# Patient Record
Sex: Female | Born: 1935 | Race: White | Hispanic: No
Health system: Southern US, Community
[De-identification: ages and names within clinical notes are randomized; demographics above are authoritative.]

## PROBLEM LIST (undated history)

## (undated) DIAGNOSIS — K922 Gastrointestinal hemorrhage, unspecified: Secondary | ICD-10-CM

## (undated) DIAGNOSIS — Z9889 Other specified postprocedural states: Secondary | ICD-10-CM

## (undated) DIAGNOSIS — I480 Paroxysmal atrial fibrillation: Secondary | ICD-10-CM

## (undated) DIAGNOSIS — F4489 Other dissociative and conversion disorders: Secondary | ICD-10-CM

## (undated) DIAGNOSIS — F419 Anxiety disorder, unspecified: Secondary | ICD-10-CM

## (undated) DIAGNOSIS — I5032 Chronic diastolic (congestive) heart failure: Secondary | ICD-10-CM

## (undated) DIAGNOSIS — I219 Acute myocardial infarction, unspecified: Secondary | ICD-10-CM

## (undated) DIAGNOSIS — H3530 Unspecified macular degeneration: Secondary | ICD-10-CM

## (undated) DIAGNOSIS — R112 Nausea with vomiting, unspecified: Secondary | ICD-10-CM

## (undated) DIAGNOSIS — I8390 Asymptomatic varicose veins of unspecified lower extremity: Secondary | ICD-10-CM

## (undated) DIAGNOSIS — I119 Hypertensive heart disease without heart failure: Secondary | ICD-10-CM

## (undated) DIAGNOSIS — J449 Chronic obstructive pulmonary disease, unspecified: Secondary | ICD-10-CM

## (undated) DIAGNOSIS — R7989 Other specified abnormal findings of blood chemistry: Secondary | ICD-10-CM

## (undated) DIAGNOSIS — G629 Polyneuropathy, unspecified: Secondary | ICD-10-CM

## (undated) DIAGNOSIS — E119 Type 2 diabetes mellitus without complications: Secondary | ICD-10-CM

## (undated) DIAGNOSIS — Z8489 Family history of other specified conditions: Secondary | ICD-10-CM

## (undated) DIAGNOSIS — N184 Chronic kidney disease, stage 4 (severe): Secondary | ICD-10-CM

## (undated) DIAGNOSIS — R778 Other specified abnormalities of plasma proteins: Secondary | ICD-10-CM

## (undated) HISTORY — DX: Gastrointestinal hemorrhage, unspecified: K92.2

## (undated) HISTORY — DX: Asymptomatic varicose veins of unspecified lower extremity: I83.90

## (undated) HISTORY — PX: VEIN SURGERY: SHX48

## (undated) HISTORY — PX: ABDOMINAL HYSTERECTOMY: SHX81

## (undated) HISTORY — DX: Paroxysmal atrial fibrillation: I48.0

## (undated) HISTORY — PX: FRACTURE SURGERY: SHX138

---

## 1997-09-02 ENCOUNTER — Other Ambulatory Visit: Admission: RE | Admit: 1997-09-02 | Discharge: 1997-09-02 | Payer: Self-pay | Admitting: Family Medicine

## 1997-09-22 ENCOUNTER — Other Ambulatory Visit: Admission: RE | Admit: 1997-09-22 | Discharge: 1997-09-22 | Payer: Self-pay | Admitting: *Deleted

## 1998-01-26 ENCOUNTER — Other Ambulatory Visit: Admission: RE | Admit: 1998-01-26 | Discharge: 1998-01-26 | Payer: Self-pay | Admitting: *Deleted

## 1998-08-17 ENCOUNTER — Encounter: Payer: Self-pay | Admitting: Emergency Medicine

## 1998-08-17 ENCOUNTER — Emergency Department (HOSPITAL_COMMUNITY): Admission: EM | Admit: 1998-08-17 | Discharge: 1998-08-17 | Payer: Self-pay | Admitting: Emergency Medicine

## 1998-09-25 ENCOUNTER — Other Ambulatory Visit: Admission: RE | Admit: 1998-09-25 | Discharge: 1998-09-25 | Payer: Self-pay | Admitting: *Deleted

## 1998-10-09 ENCOUNTER — Encounter: Admission: RE | Admit: 1998-10-09 | Discharge: 1999-01-07 | Payer: Self-pay | Admitting: Family Medicine

## 2000-11-28 ENCOUNTER — Encounter: Payer: Self-pay | Admitting: Internal Medicine

## 2000-11-28 ENCOUNTER — Ambulatory Visit (HOSPITAL_COMMUNITY): Admission: RE | Admit: 2000-11-28 | Discharge: 2000-11-28 | Payer: Self-pay | Admitting: Internal Medicine

## 2001-08-13 ENCOUNTER — Encounter (HOSPITAL_BASED_OUTPATIENT_CLINIC_OR_DEPARTMENT_OTHER): Payer: Self-pay | Admitting: Internal Medicine

## 2001-08-13 ENCOUNTER — Encounter: Admission: RE | Admit: 2001-08-13 | Discharge: 2001-08-28 | Payer: Self-pay | Admitting: Internal Medicine

## 2001-09-16 ENCOUNTER — Encounter (HOSPITAL_BASED_OUTPATIENT_CLINIC_OR_DEPARTMENT_OTHER): Admission: RE | Admit: 2001-09-16 | Discharge: 2001-10-09 | Payer: Self-pay | Admitting: Internal Medicine

## 2002-05-27 HISTORY — PX: HIP FRACTURE SURGERY: SHX118

## 2002-12-08 ENCOUNTER — Encounter: Payer: Self-pay | Admitting: Emergency Medicine

## 2002-12-08 ENCOUNTER — Inpatient Hospital Stay (HOSPITAL_COMMUNITY): Admission: AD | Admit: 2002-12-08 | Discharge: 2002-12-13 | Payer: Self-pay | Admitting: Emergency Medicine

## 2002-12-13 ENCOUNTER — Inpatient Hospital Stay (HOSPITAL_COMMUNITY): Admission: RE | Admit: 2002-12-13 | Discharge: 2002-12-21 | Payer: Self-pay | Admitting: Orthopaedic Surgery

## 2002-12-13 ENCOUNTER — Encounter: Payer: Self-pay | Admitting: Physical Medicine & Rehabilitation

## 2005-01-13 ENCOUNTER — Emergency Department (HOSPITAL_COMMUNITY): Admission: EM | Admit: 2005-01-13 | Discharge: 2005-01-13 | Payer: Self-pay | Admitting: Family Medicine

## 2005-02-26 ENCOUNTER — Emergency Department (HOSPITAL_COMMUNITY): Admission: EM | Admit: 2005-02-26 | Discharge: 2005-02-26 | Payer: Self-pay | Admitting: Family Medicine

## 2005-09-17 ENCOUNTER — Ambulatory Visit (HOSPITAL_COMMUNITY): Admission: RE | Admit: 2005-09-17 | Discharge: 2005-09-17 | Payer: Self-pay | Admitting: Ophthalmology

## 2005-10-30 ENCOUNTER — Emergency Department (HOSPITAL_COMMUNITY): Admission: EM | Admit: 2005-10-30 | Discharge: 2005-10-30 | Payer: Self-pay | Admitting: Family Medicine

## 2006-03-24 ENCOUNTER — Ambulatory Visit (HOSPITAL_COMMUNITY): Admission: RE | Admit: 2006-03-24 | Discharge: 2006-03-24 | Payer: Self-pay | Admitting: Ophthalmology

## 2009-04-12 ENCOUNTER — Observation Stay (HOSPITAL_COMMUNITY): Admission: EM | Admit: 2009-04-12 | Discharge: 2009-04-13 | Payer: Self-pay | Admitting: Emergency Medicine

## 2009-04-13 ENCOUNTER — Encounter (INDEPENDENT_AMBULATORY_CARE_PROVIDER_SITE_OTHER): Payer: Self-pay | Admitting: Emergency Medicine

## 2009-04-13 ENCOUNTER — Encounter (INDEPENDENT_AMBULATORY_CARE_PROVIDER_SITE_OTHER): Payer: Self-pay | Admitting: Internal Medicine

## 2009-04-13 ENCOUNTER — Ambulatory Visit: Payer: Self-pay | Admitting: Vascular Surgery

## 2009-04-23 ENCOUNTER — Emergency Department (HOSPITAL_COMMUNITY): Admission: EM | Admit: 2009-04-23 | Discharge: 2009-04-23 | Payer: Self-pay | Admitting: Emergency Medicine

## 2010-08-29 LAB — DIFFERENTIAL
Basophils Absolute: 0 10*3/uL (ref 0.0–0.1)
Basophils Absolute: 0.1 10*3/uL (ref 0.0–0.1)
Basophils Relative: 0 % (ref 0–1)
Basophils Relative: 1 % (ref 0–1)
Eosinophils Absolute: 0.2 K/uL (ref 0.0–0.7)
Eosinophils Relative: 3 % (ref 0–5)
Lymphocytes Relative: 17 % (ref 12–46)
Lymphs Abs: 1.2 10*3/uL (ref 0.7–4.0)
Lymphs Abs: 1.4 10*3/uL (ref 0.7–4.0)
Monocytes Absolute: 0.5 10*3/uL (ref 0.1–1.0)
Monocytes Absolute: 0.5 10*3/uL (ref 0.1–1.0)
Monocytes Relative: 5 % (ref 3–12)
Monocytes Relative: 6 % (ref 3–12)
Neutro Abs: 5.5 10*3/uL (ref 1.7–7.7)
Neutro Abs: 7.6 10*3/uL (ref 1.7–7.7)
Neutrophils Relative %: 73 % (ref 43–77)
Neutrophils Relative %: 79 % — ABNORMAL HIGH (ref 43–77)

## 2010-08-29 LAB — COMPREHENSIVE METABOLIC PANEL
ALT: 14 U/L (ref 0–35)
Albumin: 3.6 g/dL (ref 3.5–5.2)
Albumin: 3.7 g/dL (ref 3.5–5.2)
BUN: 22 mg/dL (ref 6–23)
BUN: 34 mg/dL — ABNORMAL HIGH (ref 6–23)
CO2: 26 mEq/L (ref 19–32)
Calcium: 9 mg/dL (ref 8.4–10.5)
Calcium: 9 mg/dL (ref 8.4–10.5)
Chloride: 103 mEq/L (ref 96–112)
Creatinine, Ser: 1.01 mg/dL (ref 0.4–1.2)
Creatinine, Ser: 1.17 mg/dL (ref 0.4–1.2)
GFR calc Af Amer: 55 mL/min — ABNORMAL LOW (ref >60)
GFR calc non Af Amer: 45 mL/min — ABNORMAL LOW (ref >60)
Glucose, Bld: 235 mg/dL — ABNORMAL HIGH (ref 70–99)
Total Bilirubin: 0.4 mg/dL (ref 0.3–1.2)

## 2010-08-29 LAB — URINALYSIS, ROUTINE W REFLEX MICROSCOPIC
Bilirubin Urine: NEGATIVE
Bilirubin Urine: NEGATIVE
Glucose, UA: NEGATIVE mg/dL
Hgb urine dipstick: NEGATIVE
Hgb urine dipstick: NEGATIVE
Ketones, ur: NEGATIVE mg/dL
Ketones, ur: NEGATIVE mg/dL
Nitrite: NEGATIVE
Protein, ur: NEGATIVE mg/dL
Protein, ur: NEGATIVE mg/dL
Specific Gravity, Urine: 1.009 (ref 1.005–1.030)
Specific Gravity, Urine: 1.014 (ref 1.005–1.030)
Urobilinogen, UA: 0.2 mg/dL (ref 0.0–1.0)
Urobilinogen, UA: 0.2 mg/dL (ref 0.0–1.0)
pH: 6 (ref 5.0–8.0)
pH: 6.5 (ref 5.0–8.0)

## 2010-08-29 LAB — CBC
HCT: 39.8 % (ref 36.0–46.0)
Hemoglobin: 13.4 g/dL (ref 12.0–15.0)
Hemoglobin: 13.6 g/dL (ref 12.0–15.0)
MCHC: 34.2 g/dL (ref 30.0–36.0)
MCV: 86.4 fL (ref 78.0–100.0)
Platelets: 225 10*3/uL (ref 150–400)
RBC: 4.49 MIL/uL (ref 3.87–5.11)
RBC: 4.61 MIL/uL (ref 3.87–5.11)
RDW: 14.4 % (ref 11.5–15.5)
RDW: 14.4 % (ref 11.5–15.5)
WBC: 7.5 10*3/uL (ref 4.0–10.5)
WBC: 9.7 10*3/uL (ref 4.0–10.5)

## 2010-08-29 LAB — POCT CARDIAC MARKERS
CKMB, poc: 1.4 ng/mL (ref 1.0–8.0)
Myoglobin, poc: 126 ng/mL (ref 12–200)
Troponin i, poc: <0.05 ng/mL (ref 0.00–0.09)

## 2010-08-29 LAB — COMPREHENSIVE METABOLIC PANEL WITH GFR
ALT: 18 U/L (ref 0–35)
AST: 19 U/L (ref 0–37)
Alkaline Phosphatase: 104 U/L (ref 39–117)
CO2: 28 meq/L (ref 19–32)
Glucose, Bld: 180 mg/dL — ABNORMAL HIGH (ref 70–99)
Potassium: 3.6 meq/L (ref 3.5–5.1)
Sodium: 140 meq/L (ref 135–145)
Total Protein: 6.5 g/dL (ref 6.0–8.3)

## 2010-08-29 LAB — LIPID PANEL
Cholesterol: 144 mg/dL (ref 0–200)
Triglycerides: 303 mg/dL — ABNORMAL HIGH (ref <150)

## 2010-08-29 LAB — GLUCOSE, CAPILLARY
Glucose-Capillary: 116 mg/dL — ABNORMAL HIGH (ref 70–99)
Glucose-Capillary: 130 mg/dL — ABNORMAL HIGH (ref 70–99)
Glucose-Capillary: 180 mg/dL — ABNORMAL HIGH (ref 70–99)

## 2010-08-29 LAB — APTT
aPTT: 26 seconds (ref 24–37)
aPTT: 30 seconds (ref 24–37)

## 2010-08-29 LAB — PROTIME-INR
INR: 0.97 (ref 0.00–1.49)
INR: 0.99 (ref 0.00–1.49)
Prothrombin Time: 12.8 seconds (ref 11.6–15.2)
Prothrombin Time: 13 seconds (ref 11.6–15.2)

## 2010-08-29 LAB — CK TOTAL AND CKMB (NOT AT ARMC)
CK, MB: 4.8 ng/mL — ABNORMAL HIGH (ref 0.3–4.0)
Total CK: 43 U/L (ref 7–177)

## 2010-08-29 LAB — BASIC METABOLIC PANEL
Chloride: 105 mEq/L (ref 96–112)
Creatinine, Ser: 1.11 mg/dL (ref 0.4–1.2)
GFR calc Af Amer: 58 mL/min — ABNORMAL LOW (ref >60)
Sodium: 139 mEq/L (ref 135–145)

## 2010-08-29 LAB — HEMOGLOBIN A1C: Mean Plasma Glucose: 183 mg/dL

## 2010-08-29 LAB — TROPONIN I: Troponin I: 0.02 ng/mL (ref 0.00–0.06)

## 2010-08-29 LAB — URINE MICROSCOPIC-ADD ON

## 2010-10-12 NOTE — Op Note (Signed)
Alexis [name], Alexis [name]         ACCOUNT NO.:  [id number]   MEDICAL RECORD NO.:  [id number]          PATIENT TYPE:  AMB   LOCATION:  SDS                          FACILITY:  [facility]   PHYSICIAN:  Alford Highland. Rankin, M.D.   DATE OF BIRTH:  1932/03/31   DATE OF PROCEDURE:  09/17/2005  DATE OF DISCHARGE:                                 OPERATIVE REPORT   PREOPERATIVE DIAGNOSIS:  Macular hole, stage 3, left eye.   POSTOPERATIVE DIAGNOSES:  1.  Macular hole, stage 3, left eye.  2.  Retinal hole, left eye, status post peeling o the internal limiting      membrane.   OPERATION PERFORMED:  1.  Posterior vitrectomy with membrane peel, internal limiting membrane,      using ICG guidance, 25 gauge system.  2.  Localized photocoagulation of retinal hole, supratemporal to the macular      region.  3.  Injection of vitreous substitute SF6, 30%.   SURGEON:  Alford Highland. Rankin, M.D.   ANESTHESIA:  General endotracheal anesthesia as per the patient's request.   INDICATIONS FOR THE PROCEDURE:  The patient is a 77 year old woman who has  profound vision loss on the basis of a stage 3 macular hole in the left eye.  The patient understands this is the only chance for visual acuity  improvement.  She understands the limitations of the procedure including the  underlying hole, but also the risks of anesthesia including the rare  occurrence of death; and, complications including, but not limited to  __________ , scarring, need for further surgery, no change in vision, loss  of vision, and progression of disease despite intervention.  Appropriate  signed consent was obtained.  The patient was brought to the operating room.   DESCRIPTION OF THE OPERATION:  In the operating room appropriate monitors  were placed followed by mild sedation.  General endotracheal anesthesia was  administered without difficulty.  The left periocular region was sterilely  prepped and draped in the usual ophthalmic fashion.  At this  time a 25 gauge  trocar system was placed in the infratemporal quadrant and the infusion was  placed and turned on.  A superior 25 gauge trocar was applied.   Core vitrectomy was then begun.  The microscope with bion attachment was  used.  Physician induced posterior vitreous detachment was created nasally  to the optic nerve and this was elevated anterior to the equator, and  trimmed for 360 degrees.  The anterior hyaloid was transected and removed.  At this time fluid-air exchange was completed.  With a small residual amount  of fluid over the posterior pole a dilute 1-5 of ICG was then placed over  the posterior pole and immediately removed with extrusion needle.  An air-  fluid exchange was completed.  Under fluid the internal limiting membrane  was clearly seen and was removed in a continuous sheet.  The edges to the  macular hole were released.   There was a small tractional tear just outside the superotemporal arcade.  No subretinal fluid developed.  Nonetheless, this area was treated with  localized photocoagulation for retinopexy  purposes.  At this time a fluid-  air exchange was completed, __________  exchange completed.  The trocar was  removed.  Intraocular pressure was maintained.  The infusion was remove.   Subconjunctival Decadron was applied.  The patient was given Cipro IV.  The  patient had a sterile patch and Fox shield applied.  The patient was taken  to the recovery room in good and stable condition.      Alford Highland Rankin, M.D.  Electronically Signed     GAR/MEDQ  D:  09/17/2005  T:  09/17/2005  Job:  [id number]

## 2010-10-12 NOTE — Op Note (Signed)
NAMEBLONDIE, RIGGSBEE         ACCOUNT NO.:  [id number]   MEDICAL RECORD NO.:  [id number]          PATIENT TYPE:  AMB   LOCATION:  SDS                          FACILITY:  [facility]   PHYSICIAN:  Alford Highland. Rankin, M.D.   DATE OF BIRTH:  [birth year]   DATE OF PROCEDURE:  03/24/2006  DATE OF DISCHARGE:  03/24/2006                                 OPERATIVE REPORT   PREOPERATIVE DIAGNOSIS:  Macular hole - right eye.   POSTOPERATIVE DIAGNOSIS:  Macular hole - right eye.   PROCEDURE:  1. Posterior vitrectomy membrane peel, right eye -25 gauge, peeling of the      internal limiting membrane.  2. Injection of vitreous subsheath - 15% right eye.   SURGEON:  Alford Highland. Rankin, M.D.   ANESTHESIA:  Local __________  control.   INDICATIONS FOR PROCEDURE:  The patient is a 77 year old woman who has  profound vision loss in her second eye, also on the basis of stage-3 macular  hole.  The patient will have an attempt to remove the internal limiting  membrane so as to allow for macular hole mobilization and closure.  The  patient understands the risk of the procedure and anesthesia, including but  not limited to hemorrhage, infection, scarring, need for another surgery, no  change of vision, loss of vision, and progressive disease despite  intervention.  Appropriate signed consent was obtained.   The patient taken to the operative room.  In the operating room, appropriate  monitors were applied.  After __________ 0.75% Marcaine, in 5 mL,  retrobulbar with additional 5 mL __________ modified Darel Hong.  The right  periocular region was sterilely prepped and draped in the ophthalmic  fashion, lid speculum was applied.  A 25-gauge trocar was applied in the  inferotemporal quadrant.  Superior trocar was applied.  Cor vitrectomy was  then begun.  Physician induced posterior vitreus test was created.  Vitreous  was scored and was then turned 360 degrees.  At this time, fluid-air  exchange was completed.   Dilute ICG indocyanine green was placed over the  posterior pole.  This is immediately aspirated and an air-fluid exchange  completed.  Under fluid, the internal limiting membrane was adequately  identified and it was removed in a continuous fashion with all the edges  from the edge of the macular hole were removed, mobilized.  An area  approximately 1 to 1.5 disk diameter around the edge of the macular hole was  removed.  No complications occurred.  At this time, a fluid air exchange  completed.  At this time, an SF-6 15% exchange completed.  Superior trocar  was removed.  The infusion removed.  There was a small conjunctival rent  superiorly and this closed with 7-0 Vicryl.  Subconjunctival injection of  Decadron applied.  A sterile patch and Fox shield applied.  The patient  tolerated the procedure without complications.     Alford Highland Rankin, M.D.  Electronically Signed    GAR/MEDQ  D:  03/24/2006  T:  03/25/2006  Job:  [id number]

## 2010-10-12 NOTE — Discharge Summary (Signed)
[name], [name]                     ACCOUNT NO.:  [id number]   MEDICAL RECORD NO.:  [id number]                   PATIENT TYPE:  IPS   LOCATION:  4143                                 FACILITY:  [facility]   PHYSICIAN:  Alexis [name], M.D.             DATE OF BIRTH:  March 28, 1932   DATE OF ADMISSION:  12/13/2002  DATE OF DISCHARGE:  12/21/2002                                 DISCHARGE SUMMARY   DISCHARGE DIAGNOSES:  1. Open reduction and internal fixation right hip fracture.  2. Postoperative anemia.  3. Diabetes mellitus.  4. Chronic renal insufficiency, improved.   HISTORY OF PRESENT ILLNESS:  [name] is a 77 year old female  with a history of diabetes mellitus, chronic renal insufficiency admitted on  December 08, 2002, with fall and right hip pain secondary to right  subtrochanteric fracture.  She was evaluated by Dr. Ophelia [name] and underwent  right hip ORIF the same day.  Postoperatively, she is touchdown  weightbearing and on Coumadin for DVT prophylaxis.  She did develop  postoperative anemia and was transfused with ___ units of packed red blood  cells with H&H at 8.1.  Physical therapy initiated and the patient is +2  total assist, 50% for transfers, has ambulated 20 feet with standard walker,  requiring cues for weightbearing restrictions.   ALLERGIES:  1. PENICILLIN.  2. NOVOCAINE.   PAST MEDICAL HISTORY:  1. Diabetes mellitus type 2.  2. Peripheral edema.  3. Gout.   SOCIAL HISTORY:  The patient lives with her husband in a one level home with  one step at entry.  She was independent prior to admission.  She did not use  any alcohol or tobacco.  Preferably requesting some assistance post  discharge.   HOSPITAL COURSE:  [name] was admitted to rehab on December 13, 2002, for inpatient therapies to consist of PT and OT daily.  Past  admission, she was maintained on Coumadin for DVT prophylaxis and is to  continue until August 08, 2002,  with [facility] to follow and adjust.  The patient was transfused with two additional units of packed red blood  cells for continued postoperative anemia.  Follow-up CBC on December 14, 2002,  shows hemoglobin 11.7, hematocrit 32.9, white count 6.9, platelets 276.  Check of lytes at admission showed renal insufficiency improved with sodium  130, potassium 3.8, chloride 99, CO2 29, BUN 26, creatinine 1.1, glucose  122.  A UA/UCS was done past admission and showed multispecies.  The patient  has been asymptomatic for GU symptoms.  The patient did have some complaints  of right lower extremity pain, especially in right popliteal fossa and  bilateral lower extremity Duplex were done showing a Baker cyst in right  popliteal fossa 1.06 cm, possibly rupturing.  The patient's lower extremity  incision has improved.  Her right hip incision is healing well without any  signs or symptoms of infection, no drainage,  no erythema noted. Staples were  discontinued and the area was steri-stripped on postoperative day one  without difficulty.  The patient has made good progress during her stay in  rehab.  She has progressed to being modified independent for bed mobility,  modified independent for transfers, modified independent for ambulating 50  to 100 feet with standard walker.  She requires minimal assist for  navigation of one step.  The patient is modified independent to occasional  supervision for ADL needs.  She will continue to receive follow-up home  health PT past discharge with home health R.N. to draw pro times on December 20, 2002.  The patient is discharged to home.   DISCHARGE MEDICATIONS:  1. Coumadin 5 mg per day.  2. _______ _000 mg q.h.s.  3. Ferrous sulfate 325 mg a day.  4. OxyContin CR 10 mg q.12h.  5. Hydrochlorothiazide 25 mg q. Tuesday and Thursday.  6. ________ 1000 mg b.i.d.  7. Senokot S one q.h.s. p.r.n.  8. Robaxin 500 mg q.i.d. p.r.n. spasms.  9. Oxycodone 5 mg q.4-6h.  p.r.n. pain.   ACTIVITY:  Continue using walker.  Touchdown weightbearing to right lower  extremity.   DISCHARGE DIET:  Diabetic diet.   DISCHARGE INSTRUCTIONS:  No alcohol, no smoking, no driving.  Avoid aspirin  and aspirin-containing products while on Coumadin.  Continue monitoring  blood sugars with CBG checks.  [facility] to provide PT and R.N.   FOLLOW UP:  The patient is to follow up with Dr. Ophelia [name] in the next couple of  weeks, to follow up with Dr. Chilton [name] for reevaluation of medical management,  to follow up with Dr. Riley [name] as needed.      Alexis [name], P.A.                    Alexis [name], M.D.    PP/MEDQ  D:  12/20/2002  T:  12/21/2002  Job:  [id number]   cc:   Alexis Curt. Alexis [name], M.D.  [address]  [place]  [place] [address]  Fax: [phone]   Alexis Fells. Alexis [name], M.D.  [address]  [place], [place] [address]  Fax: [phone]

## 2010-10-12 NOTE — Op Note (Signed)
   Alexis [name], RAU                     ACCOUNT NO.:  [id number]   MEDICAL RECORD NO.:  [id number]                   PATIENT TYPE:  INP   LOCATION:  5034                                 FACILITY:  [facility]   PHYSICIAN:  Mark C. Ophelia Charter, M.D.                 DATE OF BIRTH:  [birth year]   DATE OF PROCEDURE:  12/08/2002  DATE OF DISCHARGE:  12/13/2002                                 OPERATIVE REPORT   PREOPERATIVE DIAGNOSIS:  Right intertrochanteric subtrochanteric femur  fracture.   POSTOPERATIVE DIAGNOSIS:  Right intertrochanteric subtrochanteric femur  fracture.   PROCEDURE:  Open reduction and internal fixation right intertrochanteric  subtrochanteric hip fracture.   SURGEON:  Mark C. Ophelia Charter, M.D.   ASSISTANT:  Genene Churn. Barry Dienes, P.A.   ESTIMATED BLOOD LOSS:  600 mL.   PROCEDURE:  After induction of general anesthesia, orotracheal intubation,  the patient was placed on the fracture table with distraction and internal  rotation of the trochanter, visualization under fluoroscopy with the other  leg in the well leg holder.  A standard DuraPrep was performed after  reduction was obtained.  A standard large barrier Vi Drape was applied.  An  incision was made laterally over the hip in line with tensor fascia.  The  vastus lateralis was stripped off the femur and pulled anteriorly and pin  was drilled up center/center on AP and lateral, over-reamed for an 85 mm  screw.  A 135, 10 hole plate was selected and held against the femur with  the [place] claw clamp.  Screw holes were drilled, depth gauge measured, and  self-tapping screws inserted, Synthes, which were 36 and 38 mm.  After  irrigation with saline solution, tensor fascia was closed with 0 Vicryl,  subcutaneous tissue with 2-0 Vicryl, skin staple closure.  Marcaine  infiltration in the skin, and transferred to a regular bed, extubated.  Transferred to the recovery room in stable condition.                 Mark C. Ophelia Charter, M.D.    MCY/MEDQ  D:  03/03/2003  T:  03/03/2003  Job:  [id number]

## 2010-10-12 NOTE — Discharge Summary (Signed)
Alexis [name], Alexis [name]                     ACCOUNT NO.:  [id number]   MEDICAL RECORD NO.:  [id number]                   PATIENT TYPE:  INP   LOCATION:  5034                                 FACILITY:  [facility]   PHYSICIAN:  Mark C. Ophelia Charter, M.D.                 DATE OF BIRTH:  1931/07/16   DATE OF ADMISSION:  12/08/2002  DATE OF DISCHARGE:  12/13/2002                                 DISCHARGE SUMMARY   DISCHARGE DIAGNOSES:  1. Status post open reduction, internal fixation right femur for     subtrochanteric/intertrochanteric femur fracture 08 December 2002.  2. Irritable bowel syndrome.  3. Diabetic neuropathy.  4. Long-term use of anticoagulants.   HISTORY OF PRESENT ILLNESS:  A 77 year old female presented to [facility]  Emergency Room after a fall while walking her dog.  She fell, landing on her  right hip.  She was unable to walk after this incident.  X-rays showed right  subtrochanteric/intertrochanteric femur fracture.   LABORATORY DATA BEFORE ADMISSION:  WBC 11.4, hematocrit 31.9, hemoglobin  11.2, platelets 237.  Sodium 140, potassium 4.5, chloride 110, CO2 24, BUN  33, creatinine 1.2, glucose 112.  PT 12.1, INR 0.9.  UA negative.   HOSPITAL COURSE:  On December 08, 2002, the patient was taken to the operating  room at [facility], and an open reduction, internal fixation right  femur procedure was performed.  Surgeon: Veverly Fells. Ophelia Charter, M.D.  Assistant:  Genene Churn. Barry Dienes, P.A.-C.  Anesthesia: General.  Estimated blood loss: Less than 600 ml.  There were no  surgical or anesthesia complications.  The patient was transferred to  recovery in stable condition.   On December 09, 2002, hemoglobin 8.7.  INR 1.2, potassium 5.7, BUN 31,  creatinine 1.4.  IV was changed to D5 half normal saline with no KCl.  Recheck CBC and BMET in a.m.  Physical therapy evaluated patient.   On December 10, 2002, the patient complained of some right hip pain.  Denied  chest pain, shortness of breath.  Some  dizziness.  Vital signs stable.  Temperature 100.6. PT 15.6, INR 1.4.  Hemoglobin 7.0.  Right stable.  Dressing bloody, staples intact.  No active bleeding.  Thigh firm, tender to  palpation.  Calf soft and nontender.  Pedal pulse 1 to 2+.  The patient was  transfused 1 units packed red blood cells, given Lasix 40 mg IV after the  first unit.  She was started on Coumadin per pharmacy protocol.   On December 11, 2002, vital signs stable with temperature 100.4.  The patient  was given a second unit packed red blood cells.   On December 12, 2002, vital signs stable, afebrile.  Ambulated 20 feet.  On December 13, 2002, good pain control.  Complained of some right buttock and sacral  pain. Vital signs stable, afebrile.  Hemoglobin 8.1  PT 17.7, INR 1.7.  Stable.  Staples intact.  Slight serous drainage out of her wound.  Right SI  joint mildly tender to palpation.  No thigh tenderness.  The patient stable  to this point and ready for transfer to rehabilitation.   DISPOSITION:  Transferred to rehabilitation.   MEDICATIONS:  Resume all current medications.   CONDITION ON DISCHARGE:  Good and stable.   DISCHARGE INSTRUCTIONS:  1. The patient will continue with physical therapy to improve ambulation and     strengthening.  2. Staples to be removed two weeks postoperatively.  3. She will remain on Coumadin for DVT prophylaxis x 3 to 4 weeks     postoperatively.  4. Dressing changes p.r.n.  5. She will follow up in our office two weeks after discharge from     rehabilitation.  If there are any problems or complications before that     time, we can be notified at [phone].      Genene Churn. Denton Meek.                      Mark C. Ophelia Charter, M.D.    JMO/MEDQ  D:  01/17/2003  T:  01/17/2003  Job:  [id number]

## 2010-10-12 NOTE — Consult Note (Signed)
[facility]  Patient:    Alexis [name], Alexis [name] Visit Number: [id number] MRN: [id number]          Service Type: FTC Location: FOOT Attending Physician:  Sharren Bridge Dictated by:   Jonelle Sports Cheryll Cockayne, M.D. Proc. Date: 08/13/01 Admit Date:  08/13/2001   CC:         Lenon Curt. Cassell Clement, M.D.   Consultation Report  HISTORY:  This 77 year old white female comes self-referred for evaluation of her feet in the face of diabetes mellitus and with some swelling in the right which she does not understand.  The patient apparently has had type 2 diabetes for several years but has been in good control with recent hemoglobin A1c of 6% in January of this year.  She has been aware of diabetic neuropathy with insensitivity in both feet, and has previously had some minimal calluses on the right foot only.  She has obtained appropriate footwear with inserts but feels that these are too large and that her feet slip in them and that she klunks around in them.  She has not gone back to have them reevaluated.  She apparently has a history of gout but recently has begun to have puffiness in the dorsal aspect of the right foot in the tarsometatarsal area and also laterally roughly over the fourth tarsometatarsal joint.  She is concerned about these things and came for our evaluation and advice.  PAST MEDICAL HISTORY: 1. Type 2 diabetes mellitus. 2. Hypertension. 3. History of gout.  ALLERGIES:  PENICILLIN, SULFA and NOVOCAINE.  REGULAR MEDICATIONS:  Glucophage XR, Effexor, triamterene and hydrochlorothiazide, Neurontin, Indocin, Celebrex, Allegra-D, Micardis, and Aceon.  PHYSICAL EXAMINATION:  Limited to the distal lower extremities.  DISTAL LOWER EXTREMITIES:  The patients feet are without gross deformity but there is obvious soft tissue swelling over the dorsal mid foot and lateral mid foot on the right.  Skin temperatures in those areas are  elevated approximately 2 degrees by comparison to the remainder of the foot or the comparable areas of the contralateral foot.  All pulses are palpable.  There is minimal hallux valgus formation on the right foot with slight overlapping of the second toe on the first.  There are tiny calluses at several points on the feet which have already been dremeled by the nurse and there is a persistent callus underlying the right second metatarsal head which has a central core.  Monofilament testing shows that she lacks protected sensation throughout both feet.  IMPRESSION:  It would seem to me there has to be very sincere concern about the possibility of an early Charcot process in this mid foot and she will be x-rayed and otherwise followed closely accordingly.  If such is found or continues to be clinically suspect, she will be placed in total contact casting until this can stabilize.  DISPOSITION: 1. The patient is given general instruction regarding foot care and diabetes    by video with nurse and physician reinforcement. 2. The patient is encouraged to re-consult Mr. Benjamine Sprague for    reevaluation of her shoes.  She does not bring this particular pair of    shoes with her today but it is our suspicion that probably they are well    fitted and that her tendency, like most people with neuropathy, is to    demand tighter shoes so that they can be aware of the presence of the shoe    on the foot.  The shoes she does bring  with her are clearly too small and    too narrow, and certainly are contributing to and aggravating her early    hallux valgus on the right. 3. The callused area underlying the second metatarsal head on the right foot    is sharply pared and the tiny core debrided out.  Salicylic acid and    collodion 15% have been applied for a single application. 4. A followup visit will be to this clinic in three weeks and we will bring    her back sooner if the results of x-ray to  be obtained on leaving the    clinic today indicate a need to do so. Dictated by:   Jonelle Sports Cheryll Cockayne, M.D. Attending Physician:  Sharren Bridge DD:  08/13/01 TD:  08/15/01 Job: [id number]

## 2013-06-09 ENCOUNTER — Emergency Department (HOSPITAL_COMMUNITY): Payer: Medicare Other

## 2013-06-09 ENCOUNTER — Inpatient Hospital Stay (HOSPITAL_COMMUNITY)
Admission: EM | Admit: 2013-06-09 | Discharge: 2013-06-11 | DRG: 292 | Disposition: A | Discharge status: Home Health | Payer: Medicare Other | Attending: Internal Medicine | Admitting: Internal Medicine

## 2013-06-09 ENCOUNTER — Encounter (HOSPITAL_COMMUNITY): Payer: Self-pay | Admitting: Emergency Medicine

## 2013-06-09 DIAGNOSIS — IMO0002 Reserved for concepts with insufficient information to code with codable children: Secondary | ICD-10-CM | POA: Diagnosis present

## 2013-06-09 DIAGNOSIS — F411 Generalized anxiety disorder: Secondary | ICD-10-CM | POA: Diagnosis present

## 2013-06-09 DIAGNOSIS — J449 Chronic obstructive pulmonary disease, unspecified: Secondary | ICD-10-CM | POA: Diagnosis present

## 2013-06-09 DIAGNOSIS — I10 Essential (primary) hypertension: Secondary | ICD-10-CM | POA: Diagnosis present

## 2013-06-09 DIAGNOSIS — I509 Heart failure, unspecified: Secondary | ICD-10-CM | POA: Diagnosis present

## 2013-06-09 DIAGNOSIS — G589 Mononeuropathy, unspecified: Secondary | ICD-10-CM | POA: Diagnosis present

## 2013-06-09 DIAGNOSIS — J4489 Other specified chronic obstructive pulmonary disease: Secondary | ICD-10-CM | POA: Diagnosis present

## 2013-06-09 DIAGNOSIS — E1129 Type 2 diabetes mellitus with other diabetic kidney complication: Secondary | ICD-10-CM | POA: Diagnosis present

## 2013-06-09 DIAGNOSIS — E876 Hypokalemia: Secondary | ICD-10-CM | POA: Diagnosis present

## 2013-06-09 DIAGNOSIS — T502X5A Adverse effect of carbonic-anhydrase inhibitors, benzothiadiazides and other diuretics, initial encounter: Secondary | ICD-10-CM | POA: Diagnosis present

## 2013-06-09 DIAGNOSIS — N058 Unspecified nephritic syndrome with other morphologic changes: Secondary | ICD-10-CM | POA: Diagnosis present

## 2013-06-09 DIAGNOSIS — N179 Acute kidney failure, unspecified: Secondary | ICD-10-CM | POA: Diagnosis present

## 2013-06-09 DIAGNOSIS — R0602 Shortness of breath: Secondary | ICD-10-CM | POA: Diagnosis present

## 2013-06-09 DIAGNOSIS — I5021 Acute systolic (congestive) heart failure: Secondary | ICD-10-CM | POA: Diagnosis present

## 2013-06-09 DIAGNOSIS — E1165 Type 2 diabetes mellitus with hyperglycemia: Secondary | ICD-10-CM | POA: Diagnosis present

## 2013-06-09 DIAGNOSIS — R0902 Hypoxemia: Secondary | ICD-10-CM | POA: Diagnosis present

## 2013-06-09 DIAGNOSIS — R059 Cough, unspecified: Secondary | ICD-10-CM | POA: Diagnosis present

## 2013-06-09 DIAGNOSIS — I422 Other hypertrophic cardiomyopathy: Secondary | ICD-10-CM | POA: Diagnosis present

## 2013-06-09 DIAGNOSIS — I5032 Chronic diastolic (congestive) heart failure: Secondary | ICD-10-CM | POA: Diagnosis present

## 2013-06-09 DIAGNOSIS — IMO0001 Reserved for inherently not codable concepts without codable children: Secondary | ICD-10-CM

## 2013-06-09 DIAGNOSIS — R05 Cough: Secondary | ICD-10-CM | POA: Diagnosis present

## 2013-06-09 DIAGNOSIS — R0601 Orthopnea: Secondary | ICD-10-CM | POA: Diagnosis present

## 2013-06-09 HISTORY — DX: Anxiety disorder, unspecified: F41.9

## 2013-06-09 HISTORY — DX: Nausea with vomiting, unspecified: R11.2

## 2013-06-09 HISTORY — DX: Other specified postprocedural states: Z98.890

## 2013-06-09 HISTORY — DX: Polyneuropathy, unspecified: G62.9

## 2013-06-09 LAB — PHOSPHORUS: PHOSPHORUS: 3.4 mg/dL (ref 2.3–4.6)

## 2013-06-09 LAB — CBC WITH DIFFERENTIAL/PLATELET
BASOS ABS: 0 10*3/uL (ref 0.0–0.1)
Basophils Relative: 0 % (ref 0–1)
Eosinophils Absolute: 0.1 10*3/uL (ref 0.0–0.7)
Eosinophils Relative: 1 % (ref 0–5)
HCT: 37.5 % (ref 36.0–46.0)
Hemoglobin: 12.6 g/dL (ref 12.0–15.0)
LYMPHS ABS: 0.4 10*3/uL — AB (ref 0.7–4.0)
LYMPHS PCT: 4 % — AB (ref 12–46)
MCH: 28.9 pg (ref 26.0–34.0)
MCHC: 33.6 g/dL (ref 30.0–36.0)
MCV: 86 fL (ref 78.0–100.0)
Monocytes Absolute: 0.6 10*3/uL (ref 0.1–1.0)
Monocytes Relative: 6 % (ref 3–12)
NEUTROS PCT: 90 % — AB (ref 43–77)
Neutro Abs: 9.8 10*3/uL — ABNORMAL HIGH (ref 1.7–7.7)
PLATELETS: 225 10*3/uL (ref 150–400)
RBC: 4.36 MIL/uL (ref 3.87–5.11)
RDW: 17.8 % — AB (ref 11.5–15.5)
WBC: 11 10*3/uL — AB (ref 4.0–10.5)

## 2013-06-09 LAB — URINALYSIS, ROUTINE W REFLEX MICROSCOPIC
BILIRUBIN URINE: NEGATIVE
GLUCOSE, UA: 100 mg/dL — AB
HGB URINE DIPSTICK: NEGATIVE
KETONES UR: NEGATIVE mg/dL
Leukocytes, UA: NEGATIVE
Nitrite: NEGATIVE
PROTEIN: 100 mg/dL — AB
Specific Gravity, Urine: 1.008 (ref 1.005–1.030)
UROBILINOGEN UA: 0.2 mg/dL (ref 0.0–1.0)
pH: 7 (ref 5.0–8.0)

## 2013-06-09 LAB — PRO B NATRIURETIC PEPTIDE: Pro B Natriuretic peptide (BNP): 1483 pg/mL — ABNORMAL HIGH (ref 0–450)

## 2013-06-09 LAB — COMPREHENSIVE METABOLIC PANEL
ALK PHOS: 101 U/L (ref 39–117)
ALT: 19 U/L (ref 0–35)
AST: 20 U/L (ref 0–37)
Albumin: 3.3 g/dL — ABNORMAL LOW (ref 3.5–5.2)
BUN: 21 mg/dL (ref 6–23)
CO2: 24 meq/L (ref 19–32)
Calcium: 9.1 mg/dL (ref 8.4–10.5)
Chloride: 98 mEq/L (ref 96–112)
Creatinine, Ser: 1.17 mg/dL — ABNORMAL HIGH (ref 0.50–1.10)
GFR, EST AFRICAN AMERICAN: 50 mL/min — AB (ref >90)
GFR, EST NON AFRICAN AMERICAN: 43 mL/min — AB (ref >90)
GLUCOSE: 256 mg/dL — AB (ref 70–99)
POTASSIUM: 3.6 meq/L — AB (ref 3.7–5.3)
SODIUM: 135 meq/L — AB (ref 137–147)
TOTAL PROTEIN: 6.6 g/dL (ref 6.0–8.3)
Total Bilirubin: 0.7 mg/dL (ref 0.3–1.2)

## 2013-06-09 LAB — TROPONIN I: Troponin I: <0.3 ng/mL (ref <0.30)

## 2013-06-09 LAB — MAGNESIUM: Magnesium: 1.9 mg/dL (ref 1.5–2.5)

## 2013-06-09 LAB — URINE MICROSCOPIC-ADD ON

## 2013-06-09 LAB — GLUCOSE, CAPILLARY: GLUCOSE-CAPILLARY: 221 mg/dL — AB (ref 70–99)

## 2013-06-09 MED ORDER — GABAPENTIN 100 MG PO CAPS
100.0000 mg | ORAL_CAPSULE | Freq: Four times a day (QID) | ORAL | Status: DC
  Administered 2013-06-09 – 2013-06-11 (×7): 100 mg via ORAL
  Filled 2013-06-09 (×9): qty 1

## 2013-06-09 MED ORDER — ONDANSETRON HCL 4 MG/2ML IJ SOLN: 4.0000 mg | Freq: Four times a day (QID) | INTRAMUSCULAR | Status: DC | PRN

## 2013-06-09 MED ORDER — SODIUM CHLORIDE 0.9 % IJ SOLN
3.0000 mL | Freq: Two times a day (BID) | INTRAMUSCULAR | Status: DC
  Administered 2013-06-10: 3 mL via INTRAVENOUS

## 2013-06-09 MED ORDER — NITROGLYCERIN 2 % TD OINT
1.0000 [in_us] | TOPICAL_OINTMENT | Freq: Once | TRANSDERMAL | Status: AC
  Administered 2013-06-09: 1 [in_us] via TOPICAL
  Filled 2013-06-09: qty 30

## 2013-06-09 MED ORDER — SODIUM CHLORIDE 0.9 % IJ SOLN
3.0000 mL | Freq: Two times a day (BID) | INTRAMUSCULAR | Status: DC
  Administered 2013-06-09 – 2013-06-11 (×4): 3 mL via INTRAVENOUS

## 2013-06-09 MED ORDER — FUROSEMIDE 10 MG/ML IJ SOLN
40.0000 mg | Freq: Every day | INTRAMUSCULAR | Status: DC
Start: 2013-06-09 — End: 2013-06-11
  Administered 2013-06-09 – 2013-06-10 (×2): 40 mg via INTRAVENOUS
  Filled 2013-06-09 (×3): qty 4

## 2013-06-09 MED ORDER — FUROSEMIDE 10 MG/ML IJ SOLN
40.0000 mg | Freq: Once | INTRAMUSCULAR | Status: AC
  Administered 2013-06-09: 40 mg via INTRAVENOUS
  Filled 2013-06-09: qty 4

## 2013-06-09 MED ORDER — ONDANSETRON HCL 4 MG PO TABS: 4.0000 mg | ORAL_TABLET | Freq: Four times a day (QID) | ORAL | Status: DC | PRN

## 2013-06-09 MED ORDER — SODIUM CHLORIDE 0.9 % IJ SOLN: 3.0000 mL | INTRAMUSCULAR | Status: DC | PRN

## 2013-06-09 MED ORDER — INSULIN ASPART 100 UNIT/ML ~~LOC~~ SOLN
0.0000 [IU] | Freq: Three times a day (TID) | SUBCUTANEOUS | Status: DC
  Administered 2013-06-10: 2 [IU] via SUBCUTANEOUS
  Administered 2013-06-10 (×2): 1 [IU] via SUBCUTANEOUS
  Administered 2013-06-11: 5 [IU] via SUBCUTANEOUS
  Administered 2013-06-11: 1 [IU] via SUBCUTANEOUS

## 2013-06-09 MED ORDER — HYDROCODONE-ACETAMINOPHEN 5-325 MG PO TABS: 1.0000 | ORAL_TABLET | ORAL | Status: DC | PRN

## 2013-06-09 MED ORDER — TERBINAFINE HCL 250 MG PO TABS
250.0000 mg | ORAL_TABLET | Freq: Every day | ORAL | Status: DC
  Administered 2013-06-10 – 2013-06-11 (×2): 250 mg via ORAL
  Filled 2013-06-09 (×3): qty 1

## 2013-06-09 MED ORDER — FENOFIBRATE 160 MG PO TABS
160.0000 mg | ORAL_TABLET | Freq: Every day | ORAL | Status: DC
  Administered 2013-06-10 – 2013-06-11 (×2): 160 mg via ORAL
  Filled 2013-06-09 (×2): qty 1

## 2013-06-09 MED ORDER — NITROGLYCERIN 0.4 MG SL SUBL
0.4000 mg | SUBLINGUAL_TABLET | Freq: Once | SUBLINGUAL | Status: AC
  Administered 2013-06-09: 0.4 mg via SUBLINGUAL
  Filled 2013-06-09: qty 25

## 2013-06-09 MED ORDER — LORAZEPAM 1 MG PO TABS
1.0000 mg | ORAL_TABLET | Freq: Once | ORAL | Status: AC
  Administered 2013-06-10: 01:00:00 1 mg via ORAL
  Filled 2013-06-09: qty 1

## 2013-06-09 MED ORDER — HYDROCODONE-ACETAMINOPHEN 5-325 MG PO TABS: 1.0000 | ORAL_TABLET | Freq: Four times a day (QID) | ORAL | Status: DC | PRN

## 2013-06-09 MED ORDER — ENOXAPARIN SODIUM 40 MG/0.4ML ~~LOC~~ SOLN
40.0000 mg | SUBCUTANEOUS | Status: DC
  Administered 2013-06-09 – 2013-06-10 (×2): 40 mg via SUBCUTANEOUS
  Filled 2013-06-09 (×3): qty 0.4

## 2013-06-09 MED ORDER — SODIUM CHLORIDE 0.9 % IV SOLN: 250.0000 mL | INTRAVENOUS | Status: DC | PRN

## 2013-06-09 MED ORDER — HYDRALAZINE HCL 20 MG/ML IJ SOLN
10.0000 mg | INTRAMUSCULAR | Status: DC | PRN
  Administered 2013-06-09: 21:00:00 10 mg via INTRAVENOUS
  Filled 2013-06-09: qty 1

## 2013-06-09 MED ORDER — OXYCODONE HCL 5 MG PO TABS: 5.0000 mg | ORAL_TABLET | ORAL | Status: DC | PRN

## 2013-06-09 MED ORDER — HYDRALAZINE HCL 25 MG PO TABS
25.0000 mg | ORAL_TABLET | Freq: Three times a day (TID) | ORAL | Status: DC
  Administered 2013-06-09 – 2013-06-11 (×5): 25 mg via ORAL
  Filled 2013-06-09 (×10): qty 1

## 2013-06-09 MED ORDER — ESCITALOPRAM OXALATE 10 MG PO TABS
10.0000 mg | ORAL_TABLET | Freq: Every day | ORAL | Status: DC
  Administered 2013-06-10 – 2013-06-11 (×2): 10 mg via ORAL
  Filled 2013-06-09 (×2): qty 1

## 2013-06-09 NOTE — ED Notes (Addendum)
Pt reports having shortness of breath that worsens with exertion for the past several days. Pt denies chest pain, cough, cold like symptoms or significant cardiac or respiratory history. Lung sounds are CTA. Pt has an oxygen saturation of 69% on room air. Oxygen applied via nasal cannula, which responded at 92% with 4 liters of oxygen. Pt is tachy at 110. Pt is A/O x4, however speaking in phrases or short sentences.

## 2013-06-09 NOTE — H&P (Signed)
[facility] History and Physical  Susann GivensChristine E Koenig WUJ:[id number] DOB: 07/25/1931 DOA: 06/09/2013  Referring physician: ED physician PCP: Marshia LyMICHAELS,CHASE A, PA-C    Chief Complaint: shortness of breath   HPI:  Pt is 80 yo female with diabetes and HTN, [facility] with main concern of progressively worsening shortness of breath that initially started several days prior to this admission, initially present with exertion and progressed to dyspnea at rest, associated with malaise, lower extremity swelling, 2 pillow orthopnea, non productive cough. Pt denies similar events in the past, no fevers, chills, no chest pain, no abdominal or urinary concerns. Pt denies any known weight gain, no night sweats, no specific neurological symptoms.   In ED, pt with mild shortness of breath, responding well to Lasix IV, oxygen saturation at target range. CXR consistent with pulmonary vascular congestion,   Assessment and Plan: Active Problems: CHF (congestive heart failure) - will admit to telemetry bed, place on Lasix 40 mg IV and readjust the regimen as indicated - check BNP - monitor vitals on telemetry, check TSH - daily weights, I's and O's - close monitoring of renal function while pt is on Lasix  Hypokalemia - unclear etiology - will supplement - repeat BMP in AM and and continue to supplement as indicated  - check Mg level as well Acute renal failure - unclear etiology at this time - will have to monitor renal function closely as pt is now on Lasix - BMP in AM  Diabetes mellitus  - check A1C, hold glipizide and place on SSI while inpatient Malignant HTN - place on Hydralazine scheduled and as needed - avoid ACEI/ARBs due to renal failure  Code Status: Full Family Communication: Pt at bedside Disposition Plan: Admit to telemetry   Review of Systems:  Constitutional: Negative for fever, chills. Negative for diaphoresis.  HENT: Negative for hearing loss, ear pain,  nosebleeds, congestion, sore throat, neck pain, tinnitus and ear discharge.   Eyes: Negative for blurred vision, double vision, photophobia, pain, discharge and redness.  Respiratory: Negative for wheezing and stridor.   Cardiovascular: Negative for chest pain, palpitations, orthopnea, claudication.  Gastrointestinal: Negative for heartburn, constipation, blood in stool and melena.  Genitourinary: Negative for dysuria, urgency, frequency, hematuria and flank pain.  Musculoskeletal: Negative for myalgias, back pain, joint pain and falls.  Skin: Negative for itching and rash.  Neurological: Negative for tingling, tremors, sensory change, speech change, focal weakness, loss of consciousness and headaches.  Endo/Heme/Allergies: Negative for environmental allergies and polydipsia. Does not bruise/bleed easily.  Psychiatric/Behavioral: Negative for suicidal ideas. The patient is not nervous/anxious.      Past Medical History  Diagnosis Date  . Diabetes mellitus without complication   . Hypertension   . Neuropathy   . Anxiety     Past Surgical History  Procedure Laterality Date  . Hip fracture surgery    . Abdominal hysterectomy      Social History:  reports that she has never smoked. She has never used smokeless tobacco. She reports that she does not drink alcohol or use illicit drugs.  Allergies  Allergen Reactions  . Codeine   . Penicillins     No family history on file.  Prior to Admission medications   Medication Sig Start Date End Date Taking? Authorizing Provider  escitalopram (LEXAPRO) 10 MG tablet Take 10 mg by mouth daily.   Yes Historical Provider, MD  fenofibrate 160 MG tablet Take 160 mg by mouth daily.   Yes Historical Provider, MD  gabapentin (NEURONTIN) 100 MG capsule Take 100 mg by mouth 4 (four) times daily.   Yes Historical Provider, MD  glipiZIDE (GLUCOTROL XL) 10 MG 24 hr tablet Take 10 mg by mouth 2 (two) times daily.   Yes Historical Provider, MD   terbinafine (LAMISIL) 250 MG tablet Take 250 mg by mouth daily.   Yes Historical Provider, MD    Physical Exam: Filed Vitals:   06/09/13 1700 06/09/13 1710 06/09/13 1715 06/09/13 1800  BP: 200/84  195/86 211/119  Pulse: 108 106 106 109  Temp:      TempSrc:      Resp: 26 27  27   SpO2: 91% 92% 92% 92%    Physical Exam  Constitutional: Appears well-developed and well-nourished. No distress.  HENT: Normocephalic. External right and left ear normal. Oropharynx is clear and moist.  Eyes: Conjunctivae and EOM are normal. PERRLA, no scleral icterus.  Neck: Normal ROM. Neck supple. No JVD. No tracheal deviation. No thyromegaly.  CVS: Regular rhythm, tahcycardic, S1/S2 +, no murmurs, no gallops, no carotid bruit.  Pulmonary: Effort and breath sounds normal, no stridor, bibasilar crackles   Abdominal: Soft. BS +,  no distension, tenderness, rebound or guarding.  Musculoskeletal: Normal range of motion. +1 bilateral pitting LE edema .  Lymphadenopathy: No lymphadenopathy noted, cervical, inguinal. Neuro: Alert. Normal reflexes, muscle tone coordination. No cranial nerve deficit. Skin: Skin is warm and dry. No rash noted. Not diaphoretic. No erythema. No pallor.  Psychiatric: Normal mood and affect. Behavior, judgment, thought content normal.   Labs on Admission:  Basic Metabolic Panel:  Recent Labs Lab 06/09/13 1645  NA 135*  K 3.6*  CL 98  CO2 24  GLUCOSE 256*  BUN 21  CREATININE 1.17*  CALCIUM 9.1   Liver Function Tests:  Recent Labs Lab 06/09/13 1645  AST 20  ALT 19  ALKPHOS 101  BILITOT 0.7  PROT 6.6  ALBUMIN 3.3*   CBC:  Recent Labs Lab 06/09/13 1645  WBC 11.0*  NEUTROABS 9.8*  HGB 12.6  HCT 37.5  MCV 86.0  PLT 225   Cardiac Enzymes:  Recent Labs Lab 06/09/13 1645  TROPONINI <0.30   Radiological Exams on Admission: Dg Chest Port 1 View   06/09/2013  Cardiomegaly with diffuse lung densities. Findings are concerning for pulmonary edema and cannot  exclude CHF.    EKG: Normal sinus rhythm, no ST/T wave changes  Debbora Presto, MD  [facility] Pager [phone]  If 7PM-7AM, please contact night-coverage [email or web address] Password [facility] 06/09/2013, 6:32 PM

## 2013-06-09 NOTE — Progress Notes (Signed)
   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     Subjective/Objective Assessment Detail:   confirms no DME at home No use of oxygen at home  confirmed pcp as chase Michael Pt O2 sat noted to be 89% when CM arrived in room Pt desat to 83% while using bedside commode and returned to 87% when returned to bed Pt noted to be able to sit up on side of bed, stand, pull down her bottoms and sit on commode with minimal assistance. Able to wipe & pull bottoms up independently & sit back on bed.  Pt c/o headache that has started "for a while"  Pt sat increased to 91% prior to CM leaving her room with her with noted sob & head of bed elevated  Female family at bedside     Action/Plan:   Pcp updated in EPIC   Action/Plan Detail:   Cm answered questions about Lasix (use of lasix mentioned to her by Nursing staff during use of bedside commode) for pt and female at her bedside   Anticipated DC Date:  06/12/2013     Status Recommendation to Physician:   Result of Recommendation:    Other ED Services  Consult Working Plan    DC Planning Services  Other  PCP issues  Outpatient Services - Pt will follow up    Choice offered to / List presented to:            Status of service:  Completed, signed off  ED Comments:   ED Comments Detail:

## 2013-06-09 NOTE — Progress Notes (Signed)
Utilization Review completed.  Faven Watterson RN CM  

## 2013-06-09 NOTE — Progress Notes (Signed)
06/09/2913 2252pm  A. Dreanna Kyllo RNCM [facility] providedpatient with a list of home health agencies in [facility].  Explained to patient that with home health she may recieve a visiting RN, PT, OT aide and social worker if needed for placement.  Patient reports the best number to call for an emergency id her son Onalee Hua at [phone].

## 2013-06-09 NOTE — ED Provider Notes (Signed)
CSN: [id number]     Arrival date & time 06/09/13  1626 History   First MD Initiated Contact with Patient 06/09/13 1640     Chief Complaint  Patient presents with  . Shortness of Breath   (Consider location/radiation/quality/duration/timing/severity/associated sxs/prior Treatment) HPI Comments: Patient with history of diabetes and hypertension presents to the ER for difficulty breathing. She reports that symptoms began tonight to go, but progressively and significantly worsened today. Patient is extremely short of breath now. She does not have any known history of lung disease. She never smoked. Patient denies cough. She is not experiencing any chest pain. She has noticed some increased swelling of her legs. Patient reports that she recently switched doctors and had many of her medications changed.  Patient is a 80 y.o. female presenting with shortness of breath.  Shortness of Breath Associated symptoms: no chest pain     Past Medical History  Diagnosis Date  . Diabetes mellitus without complication   . Hypertension   . Neuropathy   . Anxiety    Past Surgical History  Procedure Laterality Date  . Hip fracture surgery    . Abdominal hysterectomy     No family history on file. History  Substance Use Topics  . Smoking status: Never Smoker   . Smokeless tobacco: Never Used  . Alcohol Use: No   OB History   Grav Para Term Preterm Abortions TAB SAB Ect Mult Living                 Review of Systems  Respiratory: Positive for shortness of breath.   Cardiovascular: Positive for leg swelling. Negative for chest pain.  All other systems reviewed and are negative.    Allergies  Review of patient's allergies indicates not on file.  Home Medications  No current outpatient prescriptions on file. BP 200/81  Pulse 109  Temp(Src) 98 F (36.7 C) (Oral)  Resp 23  SpO2 92% Physical Exam  Constitutional: She is oriented to person, place, and time. She appears well-developed and  well-nourished. No distress.  HENT:  Head: Normocephalic and atraumatic.  Right Ear: Hearing normal.  Left Ear: Hearing normal.  Nose: Nose normal.  Mouth/Throat: Oropharynx is clear and moist and mucous membranes are normal.  Eyes: Conjunctivae and EOM are normal. Pupils are equal, round, and reactive to light.  Neck: Normal range of motion. Neck supple.  Cardiovascular: Regular rhythm, S1 normal and S2 normal.  Exam reveals no gallop and no friction rub.   No murmur heard. Pulmonary/Chest: Accessory muscle usage present. Tachypnea noted. No respiratory distress. She has decreased breath sounds. She has no wheezes. She has no rhonchi. She has rales. She exhibits no tenderness.  Abdominal: Soft. Normal appearance and bowel sounds are normal. There is no hepatosplenomegaly. There is no tenderness. There is no rebound, no guarding, no tenderness at McBurney's point and negative Murphy's sign. No hernia.  Musculoskeletal: Normal range of motion.  Neurological: She is alert and oriented to person, place, and time. She has normal strength. No cranial nerve deficit or sensory deficit. Coordination normal. GCS eye subscore is 4. GCS verbal subscore is 5. GCS motor subscore is 6.  Skin: Skin is warm, dry and intact. No rash noted. No cyanosis.  Psychiatric: She has a normal mood and affect. Her speech is normal and behavior is normal. Thought content normal.    ED Course  Procedures (including critical care time) Labs Review Labs Reviewed  CBC WITH DIFFERENTIAL - Abnormal; Notable for the following:  WBC 11.0 (*)    RDW 17.8 (*)    Neutrophils Relative % 90 (*)    Neutro Abs 9.8 (*)    Lymphocytes Relative 4 (*)    Lymphs Abs 0.4 (*)    All other components within normal limits  COMPREHENSIVE METABOLIC PANEL - Abnormal; Notable for the following:    Sodium 135 (*)    Potassium 3.6 (*)    Glucose, Bld 256 (*)    Creatinine, Ser 1.17 (*)    Albumin 3.3 (*)    GFR calc non Af Amer 43 (*)     GFR calc Af Amer 50 (*)    All other components within normal limits  PRO B NATRIURETIC PEPTIDE - Abnormal; Notable for the following:    Pro B Natriuretic peptide (BNP) 1483.0 (*)    All other components within normal limits  TROPONIN I   Imaging Review Dg Chest Port 1 View  06/09/2013   CLINICAL DATA:  Shortness of breath and hypertension.  EXAM: PORTABLE CHEST - 1 VIEW  COMPARISON:  04/23/2009  FINDINGS: Single view of the chest demonstrates diffuse lung densities that are new from the prior examination. The lung densities are most suggestive for edema. In addition, the heart appears to be upper limits of normal. Old left rib fractures.  IMPRESSION: Cardiomegaly with diffuse lung densities. Findings are concerning for pulmonary edema and cannot exclude CHF.   Electronically Signed   By: Richarda Overlie M.D.   On: 06/09/2013 17:01    EKG Interpretation    Date/Time:  Wednesday June 09 2013 16:38:23 EST Ventricular Rate:  112 PR Interval:  213 QRS Duration: 88 QT Interval:  352 QTC Calculation: 480 R Axis:   59 Text Interpretation:  Sinus tachycardia Ventricular premature complex Borderline prolonged PR interval LVH with secondary repolarization abnormality Baseline wander in lead(s) II aVR Nonspecific ST and T wave abnormality Confirmed by Nareh Matzke  MD, Destiney Sanabia ([id number]) on 06/09/2013 4:55:44 PM            MDM  Diagnosis: Congestive heart failure  Patient presents to the ER for evaluation of difficulty breathing. She does not have any history of lung disease. Examination is concerning for possible volume overload and congestive heart failure. She has rales on examination and moderate pitting edema of the lower extremities which is new for her. Workup shows elevated BNP and a chest x-ray that is consistent with congestive heart failure.  Patient is very hypertensive arrival. She does have a history of hypertension, but reports that prior to medication changes made by a new  doctor, she had been better controlled. Patient administered nitroglycerin sublingual and paste for blood pressure control as well as treatment of volume overload. She was given Lasix 40 mg IV. Patient will require hospitalization for further management.  Gilda Crease, MD 06/09/13 [phone]

## 2013-06-10 DIAGNOSIS — IMO0002 Reserved for concepts with insufficient information to code with codable children: Secondary | ICD-10-CM | POA: Diagnosis present

## 2013-06-10 DIAGNOSIS — E1165 Type 2 diabetes mellitus with hyperglycemia: Secondary | ICD-10-CM | POA: Diagnosis present

## 2013-06-10 DIAGNOSIS — IMO0001 Reserved for inherently not codable concepts without codable children: Secondary | ICD-10-CM

## 2013-06-10 DIAGNOSIS — E876 Hypokalemia: Secondary | ICD-10-CM | POA: Diagnosis present

## 2013-06-10 DIAGNOSIS — I10 Essential (primary) hypertension: Secondary | ICD-10-CM | POA: Diagnosis present

## 2013-06-10 DIAGNOSIS — N179 Acute kidney failure, unspecified: Secondary | ICD-10-CM | POA: Diagnosis present

## 2013-06-10 LAB — CBC
HCT: 35.4 % — ABNORMAL LOW (ref 36.0–46.0)
Hemoglobin: 11.7 g/dL — ABNORMAL LOW (ref 12.0–15.0)
MCH: 28.3 pg (ref 26.0–34.0)
MCHC: 33.1 g/dL (ref 30.0–36.0)
MCV: 85.7 fL (ref 78.0–100.0)
PLATELETS: 229 10*3/uL (ref 150–400)
RBC: 4.13 MIL/uL (ref 3.87–5.11)
RDW: 17.6 % — ABNORMAL HIGH (ref 11.5–15.5)
WBC: 12.7 10*3/uL — ABNORMAL HIGH (ref 4.0–10.5)

## 2013-06-10 LAB — BASIC METABOLIC PANEL
BUN: 20 mg/dL (ref 6–23)
CALCIUM: 8.8 mg/dL (ref 8.4–10.5)
CO2: 29 meq/L (ref 19–32)
Chloride: 94 mEq/L — ABNORMAL LOW (ref 96–112)
Creatinine, Ser: 1.14 mg/dL — ABNORMAL HIGH (ref 0.50–1.10)
GFR calc Af Amer: 52 mL/min — ABNORMAL LOW (ref >90)
GFR, EST NON AFRICAN AMERICAN: 45 mL/min — AB (ref >90)
Glucose, Bld: 237 mg/dL — ABNORMAL HIGH (ref 70–99)
Potassium: 3.1 mEq/L — ABNORMAL LOW (ref 3.7–5.3)
Sodium: 134 mEq/L — ABNORMAL LOW (ref 137–147)

## 2013-06-10 LAB — HEMOGLOBIN A1C
HEMOGLOBIN A1C: 8 % — AB (ref <5.7)
Mean Plasma Glucose: 183 mg/dL — ABNORMAL HIGH (ref <117)

## 2013-06-10 LAB — GLUCOSE, CAPILLARY
GLUCOSE-CAPILLARY: 137 mg/dL — AB (ref 70–99)
Glucose-Capillary: 181 mg/dL — ABNORMAL HIGH (ref 70–99)
Glucose-Capillary: 137 mg/dL — ABNORMAL HIGH (ref 70–99)
Glucose-Capillary: 257 mg/dL — ABNORMAL HIGH (ref 70–99)

## 2013-06-10 LAB — TSH: TSH: 1.092 u[IU]/mL (ref 0.350–4.500)

## 2013-06-10 LAB — TROPONIN I

## 2013-06-10 LAB — PRO B NATRIURETIC PEPTIDE: PRO B NATRI PEPTIDE: 2374 pg/mL — AB (ref 0–450)

## 2013-06-10 MED ORDER — POTASSIUM CHLORIDE CRYS ER 20 MEQ PO TBCR
40.0000 meq | EXTENDED_RELEASE_TABLET | ORAL | Status: AC
  Administered 2013-06-10 (×2): 40 meq via ORAL
  Filled 2013-06-10 (×2): qty 2

## 2013-06-10 NOTE — Progress Notes (Signed)
Inpatient Diabetes Program Recommendations  AACE/ADA: New Consensus Statement on Inpatient Glycemic Control (2013)  Target Ranges:  Prepandial:   less than 140 mg/dL      Peak postprandial:   less than 180 mg/dL (1-2 hours)      Critically ill patients:  140 - 180 mg/dL  Results for RAYAH, KANODE (MRN [id number]) as of 06/10/2013 11:43  Ref. Range 06/10/2013 07:38 06/10/2013 11:26  Glucose-Capillary Latest Range: 70-99 mg/dL 224 (H) 825 (H)   Results for NIKO, AUMENT (MRN [id number]) as of 06/10/2013 11:43  Ref. Range 06/09/2013 16:45 06/10/2013 02:31  Glucose Latest Range: 70-99 mg/dL 916 (H) 945 (H)    Inpatient Diabetes Program Recommendations Correction (SSI): Noted Novolog sensitive correction has been ordered. Diet: Please change diet from Regular to Carb Modified Diabetic diet.  Orlando Penner, RN, MSN, CCRN Diabetes Coordinator Inpatient Diabetes Program [phone] (Team Pager) [phone] ([facility] office) [phone] [facility] office)

## 2013-06-10 NOTE — Progress Notes (Addendum)
[facility] PROGRESS NOTE  Alexis [name] FFM:[id number] DOB: 07/16/1931 DOA: 06/09/2013 PCP: Marshia Ly, PA-C  Brief narrative 80 year old female with history of diabetes and hypertension presented to [facility] ED with  symptoms of progressively worsening dyspnea for the past several days . She also had associated orthopnea, nonproductive cough and bilateral leg swellings. Patient found to have acute CHF and uncontrolled hypertension.   Assessment/Plan: Acute CHF exacerbation Monitor on telemetry. Placed on IV Lasix 40 mg daily and symptoms improving.. Monitor strict I/O and daily weights -Check 2-D echo. -will place on baby aspirin -counseled  on salt restricted diet and medication adherence.  Acute kidney injury No recent renal function in the system possible for underlying CKD due to DM. Monitor while on lasix  Uncontrolled diabetes mellitus Hemoglobin A1c of 8. On glipizide at home. Not prepared for being on insulin at present. Will add amaryl upon discharge.  Uncontrolled HTN Added hydralazine and BP better.   Hypokalemia  replenished   DVT prophylaxis: sq lovenox  Code Status: Full code Family Communication: Daughter at bedside Disposition Plan: Home once improved   Consultants:  None  Procedures:  2-D echo  Antibiotics:  None  HPI/Subjective: Patient seen and examined this morning. He informs her dyspnea to be better  Objective: Filed Vitals:   06/10/13 1420  BP: 131/53  Pulse: 88  Temp: 98.2 F (36.8 C)  Resp: 20    Intake/Output Summary (Last 24 hours) at 06/10/13 1423 Last data filed at 06/10/13 1420  Gross per 24 hour  Intake    480 ml  Output   1625 ml  Net  -1145 ml   Filed Weights   06/09/13 1937 06/10/13 0612  Weight: 69.174 kg (152 lb 8 oz) 66.951 kg (147 lb 9.6 oz)    Exam:   General:  Elderly female in no acute distress  HEENT: No pallor, moist oral mucosa, no JVD  Chest: Scattered rhonchi bilaterally, fine  bibasilar crackles  CVS: Normal S1 and S2, no murmurs or gallop  Abdomen: Soft, nontender, nondistended, bowel sounds present, foley in place  Extremities: Warm, no edema  CNS: AAO x3    Data Reviewed: Basic Metabolic Panel:  Recent Labs Lab 06/09/13 1645 06/09/13 2019 06/10/13 0231  NA 135*  --  134*  K 3.6*  --  3.1*  CL 98  --  94*  CO2 24  --  29  GLUCOSE 256*  --  237*  BUN 21  --  20  CREATININE 1.17*  --  1.14*  CALCIUM 9.1  --  8.8  MG  --  1.9  --   PHOS  --  3.4  --    Liver Function Tests:  Recent Labs Lab 06/09/13 1645  AST 20  ALT 19  ALKPHOS 101  BILITOT 0.7  PROT 6.6  ALBUMIN 3.3*   No results found for this basename: LIPASE, AMYLASE,  in the last 168 hours No results found for this basename: AMMONIA,  in the last 168 hours CBC:  Recent Labs Lab 06/09/13 1645 06/10/13 0231  WBC 11.0* 12.7*  NEUTROABS 9.8*  --   HGB 12.6 11.7*  HCT 37.5 35.4*  MCV 86.0 85.7  PLT 225 229   Cardiac Enzymes:  Recent Labs Lab 06/09/13 1645 06/09/13 2019 06/10/13 0231  TROPONINI <0.30 <0.30 <0.30   BNP (last 3 results)  Recent Labs  06/09/13 1645 06/10/13 0231  PROBNP 1483.0* 2374.0*   CBG:  Recent Labs Lab 06/09/13 2055 06/10/13 0738 06/10/13  1126  GLUCAP 221* 137* 181*    No results found for this or any previous visit (from the past 240 hour(s)).   Studies: Dg Chest Port 1 View  06/09/2013   CLINICAL DATA:  Shortness of breath and hypertension.  EXAM: PORTABLE CHEST - 1 VIEW  COMPARISON:  04/23/2009  FINDINGS: Single view of the chest demonstrates diffuse lung densities that are new from the prior examination. The lung densities are most suggestive for edema. In addition, the heart appears to be upper limits of normal. Old left rib fractures.  IMPRESSION: Cardiomegaly with diffuse lung densities. Findings are concerning for pulmonary edema and cannot exclude CHF.   Electronically Signed   By: Richarda OverlieAdam  Henn M.D.   On: 06/09/2013 17:01     Scheduled Meds: . enoxaparin (LOVENOX) injection  40 mg Subcutaneous Q24H  . escitalopram  10 mg Oral Daily  . fenofibrate  160 mg Oral Daily  . furosemide  40 mg Intravenous Daily  . gabapentin  100 mg Oral QID  . hydrALAZINE  25 mg Oral Q8H  . insulin aspart  0-9 Units Subcutaneous TID WC  . sodium chloride  3 mL Intravenous Q12H  . sodium chloride  3 mL Intravenous Q12H  . terbinafine  250 mg Oral Daily   Continuous Infusions:     Time spent: 25 minutes    Memorie Yokoyama  [facility] Pager [phone] If 7PM-7AM, please contact night-coverage at [email or web address], password [email or web address] 06/10/2013, 2:23 PM  LOS: 1 day

## 2013-06-10 NOTE — Progress Notes (Signed)
Echocardiogram 2D Echocardiogram has been performed.  Wm Sahagun 06/10/2013, 11:32 AM

## 2013-06-10 NOTE — Evaluation (Signed)
Physical Therapy Evaluation Patient Details Name: Alexis [name] MRN: [id number] DOB: Jun 23, 1931 Today's Date: 06/10/2013 Time: 1937-9024 PT Time Calculation (min): 20 min  PT Assessment / Plan / Recommendation History of Present Illness  80 yo female admitted with CHF, SOB. Hx of DM, neuropathy, HTN, anxiety.   Clinical Impression  On eval, pt required Min assist for mobility-able to ambulate ~115 feet with walker. Demonstrates general weakness and decreased activity tolerance. Recommend HHPT, int supervision at discharge.     PT Assessment  Patient needs continued PT services    Follow Up Recommendations  Home health PT;Supervision - Intermittent    Does the patient have the potential to tolerate intense rehabilitation      Barriers to Discharge        Equipment Recommendations  None recommended by PT    Recommendations for Other Services OT consult   Frequency Min 3X/week    Precautions / Restrictions Precautions Precautions: Fall Restrictions Weight Bearing Restrictions: No   Pertinent Vitals/Pain 94% 3L at rest 89-90% RA at rest 85% on RA during ambulation      Mobility  Bed Mobility Overal bed mobility: Modified Independent General bed mobility comments: Increased time. HOB elevated. Used rails.  Transfers Overall transfer level: Needs assistance Equipment used: Rolling walker (2 wheeled) Transfers: Sit to/from Stand Sit to Stand: Min assist General transfer comment: Assist to rise, stabilize, control descent. VCs safety, technique, hand placement Ambulation/Gait Ambulation/Gait assistance: Min assist Ambulation Distance (Feet): 115 Feet Assistive device: Rolling walker (2 wheeled) General Gait Details: Assist to stabilize throughout ambulation. slow gait speed.     Exercises     PT Diagnosis: Difficulty walking;Generalized weakness  PT Problem List: Decreased strength;Decreased range of motion;Decreased activity tolerance;Decreased  balance;Decreased mobility;Decreased knowledge of use of DME PT Treatment Interventions: DME instruction;Gait training;Functional mobility training;Therapeutic activities;Therapeutic exercise;Patient/family education     PT Goals(Current goals can be found in the care plan section) Acute Rehab PT Goals Patient Stated Goal: home. feel better PT Goal Formulation: With patient Time For Goal Achievement: 06/24/13 Potential to Achieve Goals: Good  Visit Information  Last PT Received On: 06/10/13 Assistance Needed: +1 History of Present Illness: 80 yo female admitted with CHF, SOB. Hx of DM, neuropathy, HTN, anxiety.        Prior Functioning  Home Living Family/patient expects to be discharged to:: Private residence Living Arrangements: Children Available Help at Discharge: Family Type of Home: House Home Access: Stairs to enter Secretary/administrator of Steps: 1+1 Entrance Stairs-Rails: None Home Layout: One level Home Equipment: Environmental consultant - 2 wheels Additional Comments: adjustable bed Prior Function Level of Independence: Independent with assistive device(s) Comments: uses walker when taking walks oustide in summertime Communication Communication: No difficulties    Cognition  Cognition Arousal/Alertness: Awake/alert Behavior During Therapy: WFL for tasks assessed/performed Overall Cognitive Status: Within Functional Limits for tasks assessed    Extremity/Trunk Assessment Upper Extremity Assessment Upper Extremity Assessment: Generalized weakness Lower Extremity Assessment Lower Extremity Assessment: Generalized weakness Cervical / Trunk Assessment Cervical / Trunk Assessment: Normal   Balance    End of Session PT - End of Session Activity Tolerance: Patient limited by fatigue Patient left: in chair;with call bell/phone within reach;with chair alarm set;with nursing/sitter in room  GP     Rebeca Alert, MPT Pager: [phone]

## 2013-06-11 LAB — GLUCOSE, CAPILLARY
Glucose-Capillary: 128 mg/dL — ABNORMAL HIGH (ref 70–99)
Glucose-Capillary: 258 mg/dL — ABNORMAL HIGH (ref 70–99)

## 2013-06-11 LAB — BASIC METABOLIC PANEL
BUN: 33 mg/dL — AB (ref 6–23)
CO2: 28 mEq/L (ref 19–32)
CREATININE: 1.56 mg/dL — AB (ref 0.50–1.10)
Calcium: 8.9 mg/dL (ref 8.4–10.5)
Chloride: 101 mEq/L (ref 96–112)
GFR calc Af Amer: 35 mL/min — ABNORMAL LOW (ref >90)
GFR, EST NON AFRICAN AMERICAN: 30 mL/min — AB (ref >90)
Glucose, Bld: 117 mg/dL — ABNORMAL HIGH (ref 70–99)
Potassium: 3.8 mEq/L (ref 3.7–5.3)
Sodium: 142 mEq/L (ref 137–147)

## 2013-06-11 MED ORDER — SITAGLIPTIN PHOSPHATE 50 MG PO TABS: 50.0000 mg | ORAL_TABLET | Freq: Every day | ORAL | Status: DC

## 2013-06-11 MED ORDER — METOPROLOL TARTRATE 12.5 MG HALF TABLET
12.5000 mg | ORAL_TABLET | Freq: Two times a day (BID) | ORAL | Status: DC
  Administered 2013-06-11: 12:00:00 12.5 mg via ORAL
  Filled 2013-06-11 (×2): qty 1

## 2013-06-11 MED ORDER — ALBUTEROL SULFATE HFA 108 (90 BASE) MCG/ACT IN AERS: 2.0000 | INHALATION_SPRAY | Freq: Four times a day (QID) | RESPIRATORY_TRACT | Status: AC | PRN

## 2013-06-11 MED ORDER — FUROSEMIDE 40 MG PO TABS: 40.0000 mg | ORAL_TABLET | Freq: Every day | ORAL | Status: DC

## 2013-06-11 MED ORDER — ENOXAPARIN SODIUM 30 MG/0.3ML ~~LOC~~ SOLN
30.0000 mg | SUBCUTANEOUS | Status: DC
  Filled 2013-06-11: qty 0.3

## 2013-06-11 MED ORDER — ALBUTEROL SULFATE (2.5 MG/3ML) 0.083% IN NEBU: 2.5000 mg | INHALATION_SOLUTION | Freq: Four times a day (QID) | RESPIRATORY_TRACT | Status: DC | PRN

## 2013-06-11 MED ORDER — METOPROLOL TARTRATE 12.5 MG HALF TABLET: 12.5000 mg | ORAL_TABLET | Freq: Two times a day (BID) | ORAL | Status: DC

## 2013-06-11 MED ORDER — ASPIRIN EC 81 MG PO TBEC: 81.0000 mg | DELAYED_RELEASE_TABLET | Freq: Every day | ORAL | Status: DC

## 2013-06-11 MED ORDER — FUROSEMIDE 40 MG PO TABS
40.0000 mg | ORAL_TABLET | Freq: Every day | ORAL | Status: DC
  Administered 2013-06-11: 12:00:00 40 mg via ORAL
  Filled 2013-06-11: qty 1

## 2013-06-11 MED ORDER — GLIMEPIRIDE 2 MG PO TABS: 2.0000 mg | ORAL_TABLET | Freq: Every day | ORAL | Status: DC

## 2013-06-11 NOTE — Progress Notes (Addendum)
Physical Therapy Treatment Patient Details Name: Alexis [name] MRN: [id number] DOB: Jul 14, 1931 Today's Date: 06/11/2013 Time: 4580-9983 PT Time Calculation (min): 33 min  PT Assessment / Plan / Recommendation  History of Present Illness 80 yo female admitted with CHF, SOB. Hx of DM, neuropathy, HTN, anxiety.    PT Comments   Progressing with mobility. Still somewhat deconditioned-will recommend HHPT. O2 sats 86% on RA during session-made MD aware.   Follow Up Recommendations  Home health PT;Supervision - Intermittent     Does the patient have the potential to tolerate intense rehabilitation     Barriers to Discharge        Equipment Recommendations  None recommended by PT    Recommendations for Other Services OT consult  Frequency Min 3X/week   Progress towards PT Goals Progress towards PT goals: Progressing toward goals  Plan Current plan remains appropriate    Precautions / Restrictions Precautions Precautions: Fall Restrictions Weight Bearing Restrictions: No   Pertinent Vitals/Pain 88-89% RA at rest 86% RA during ambulation 95 % 2.5L O2 at rest end of session    Mobility  Bed Mobility Overal bed mobility: Modified Independent Transfers Overall transfer level: Needs assistance Sit to Stand: Min guard General transfer comment: . VCs safety, technique, hand placement Ambulation/Gait Ambulation/Gait assistance: Min guard Ambulation Distance (Feet): 135 Feet x 2 (seated rest break for recovery) Assistive device: Rolling walker (2 wheeled) Gait Pattern/deviations: Step-through pattern General Gait Details: Assist to stabilize throughout ambulation. slow gait speed. O2 sats 86% on RA during ambulation.     Exercises     PT Diagnosis:    PT Problem List:   PT Treatment Interventions:     PT Goals (current goals can now be found in the care plan section)    Visit Information  Last PT Received On: 06/11/13 Assistance Needed: +1 History of Present  Illness: 80 yo female admitted with CHF, SOB. Hx of DM, neuropathy, HTN, anxiety.     Subjective Data      Cognition  Cognition Arousal/Alertness: Awake/alert Behavior During Therapy: WFL for tasks assessed/performed Overall Cognitive Status: Within Functional Limits for tasks assessed    Balance     End of Session PT - End of Session Equipment Utilized During Treatment: Gait belt Activity Tolerance: Patient tolerated treatment well Patient left: in chair;with call bell/phone within reach   GP     Rebeca Alert, MPT Pager: [phone]

## 2013-06-11 NOTE — Progress Notes (Signed)
Pt declined Home Health RN/PT. Pt agreed with [facility] for Home O2.

## 2013-06-11 NOTE — Discharge Instructions (Signed)
Heart Failure Heart failure means your heart has trouble pumping blood. This makes it hard for your body to work well. Heart failure is usually a long-term (chronic) condition. You must take good care of yourself and follow your doctor's treatment plan. HOME CARE  Take your heart medicine as told by your doctor.  Do not stop taking medicine unless your doctor tells you to.  Do not skip any dose of medicine.  Refill your medicines before they run out.  Take other medicines only as told by your doctor or pharmacist.  Stay active if told by your doctor. The elderly and people with severe heart failure should talk with a doctor about physical activity.  Eat heart healthy foods. Choose foods that are without trans fat and are low in saturated fat, cholesterol, and salt (sodium). This includes fresh or frozen fruits and vegetables, fish, lean meats, fat-free or low-fat dairy foods, whole grains, and high-fiber foods. Lentils and dried peas and beans (legumes) are also good choices.  Limit salt if told by your doctor.  Cook in a healthy way. Roast, grill, broil, bake, poach, steam, or stir-fry foods.  Limit fluids as told by your doctor.  Weigh yourself every morning. Do this after you pee (urinate) and before you eat breakfast. Write down your weight to give to your doctor.  Take your blood pressure and write it down if your doctor tell you to.  Ask your doctor how to check your pulse. Check your pulse as told.  Lose weight if told by your doctor.  Stop smoking or chewing tobacco. Do not use gum or patches that help you quit without your doctor's approval.  Schedule and go to doctor visits as told.  Nonpregnant women should have no more than 1 drink a day. Men should have no more than 2 drinks a day. Talk to your doctor about drinking alcohol.  Stop illegal drug use.  Stay current with shots (immunizations).  Manage your health conditions as told by your doctor.  Learn to manage  your stress.  Rest when you are tired.  If it is really hot outside:  Avoid intense activities.  Use air conditioning or fans, or get in a cooler place.  Avoid caffeine and alcohol.  Wear loose-fitting, lightweight, and light-colored clothing.  If it is really cold outside:  Avoid intense activities.  Layer your clothing.  Wear mittens or gloves, a hat, and a scarf when going outside.  Avoid alcohol.  Learn about heart failure and get support as needed.  Get help to maintain or improve your quality of life and your ability to care for yourself as needed. GET HELP IF:   You gain 03 lb/1.4 kg or more in 1 day or 05 lb/2.3 kg in a week.  You are more short of breath than usual.  You cannot do your normal activities.  You tire easily.  You cough more than normal, especially with activity.  You have any or more puffiness (swelling) in areas such as your hands, feet, ankles, or belly (abdomen).  You cannot sleep because it is hard to breathe.  You feel like your heart is beating fast (palpitations).  You get dizzy or lightheaded when you stand up. GET HELP RIGHT AWAY IF:   You have trouble breathing.  There is a change in mental status, such as becoming less alert or not being able to focus.  You have chest pain or discomfort.  You faint. MAKE SURE YOU:   Understand these   instructions.  Will watch your condition.  Will get help right away if you are not doing well or get worse. Document Released: 02/20/2008 Document Revised: 09/07/2012 Document Reviewed: 12/12/2011 Exit[employer] Patient Information 2014 [employer].  

## 2013-06-11 NOTE — Discharge Summary (Addendum)
Physician Discharge Summary  Alexis Alexis [name]:[id number] DOB: 10/21/1931 DOA: 06/09/2013  PCP: Malen Gauze A, PA-C  Admit date: 06/09/2013 Discharge date: 06/11/2013  Time spent: 40  minutes  Recommendations for Outpatient Follow-up:  1. Home with home health RN and PT 2. Follow up with PCP in 1 week. please check k and creatinine  Discharge Diagnoses:   Principal Problem:   CHF (congestive heart failure), systolic with hypertrophic cardiomyopathy  Active Problems:   Type II or unspecified type diabetes mellitus without mention of complication, uncontrolled   Hypertension   AKI (acute kidney injury)   Hypokalemia   Other hypertrophic cardiomyopathy    Hypoxia    Discharge Condition: fair  Diet recommendation: cardiac  Filed Weights   06/09/13 1937 06/10/13 0612 06/11/13 0524  Weight: 69.174 kg (152 lb 8 oz) 66.951 kg (147 lb 9.6 oz) 67.3 kg (148 lb 5.9 oz)    History of present illness:  80 year old Alexis [name] with history of diabetes and hypertension presented to [facility] ED with symptoms of progressively worsening dyspnea for the past several days . She also had associated orthopnea, nonproductive cough and bilateral leg swellings. Patient found to have acute CHF and uncontrolled hypertension.   Hospital Course:  Acute CHF exacerbation  Monitor on telemetry. Placed on IV Lasix 40 mg daily and symptoms improving..  She ha and symptoms have markedly improved.s been negative by almost 1 L since admission. -2-D echo showed EF of 65-70% with concentric hypertrophy and vigorous systolic function.  - patient clinically improved and will transitioned to  Lasix 40 mg by mouth daily . will place on baby aspirin .  added low-dose metoprolol.  -counseled on salt restricted diet and medication adherence.   Acute kidney injury  No recent renal function in the system possible for underlying CKD due to DM.  Worsened with IV lasix. Follow as outpt   Uncontrolled diabetes mellitus   Hemoglobin A1c of 8. On glipizide at home. Not prepared for being on insulin at present. Will add januvia upon d/c .  Uncontrolled HTN  improved with diuresis. Discharge on oral lasix.  Added low dose metoprolol.  Hypokalemia  replenished   Hypoxia Patient noted to have decreased O2 sat of 88% on room air at rest and 86% on ambulation. sats improve to >90% on 2L via [place]. Will discharge home on 2 L via nasal cannula and follow up as outpatient. She possibly has some underlying COPD is palpated will prescribe albuterol inhaler as well.   Code Status: Full code  Family Communication:  discussed with daughter on 1/15.  Disposition Plan: Home  With home health PT and on in  Consultants:  None Procedures:  2-D echo   Antibiotics:  None    Discharge Exam: Filed Vitals:   06/11/13 0524  BP: 149/51  Pulse: 87  Temp: 99.1 F (37.3 C)  Resp: 19    General: Elderly Alexis [name] in no acute distress  HEENT: No pallor, moist oral mucosa, no JVD  Chest: fine bibasilar crackles , no rhonchi or wheeze CVS: Normal S1 and S2, no murmurs or gallop  Abdomen: Soft, nontender, nondistended, bowel sounds present  Extremities: Warm, no edema  CNS: AAO x3   Discharge Instructions     Medication List         escitalopram 10 MG tablet  Commonly known as:  LEXAPRO  Take 10 mg by mouth daily.     fenofibrate 160 MG tablet  Take 160 mg by mouth daily.  furosemide 40 MG tablet  Commonly known as:  LASIX  Take 1 tablet (40 mg total) by mouth daily.     gabapentin 100 MG capsule  Commonly known as:  NEURONTIN  Take 100 mg by mouth 4 (four) times daily.     glipiZIDE 10 MG 24 hr tablet  Commonly known as:  GLUCOTROL XL  Take 10 mg by mouth 2 (two) times daily.     metoprolol tartrate 12.5 mg Tabs tablet  Commonly known as:  LOPRESSOR  Take 0.5 tablets (12.5 mg total) by mouth 2 (two) times daily.     sitaGLIPtin 50 MG tablet  Commonly known as:  JANUVIA  Take 1 tablet (50 mg  total) by mouth daily.     terbinafine 250 MG tablet  Commonly known as:  LAMISIL  Take 250 mg by mouth daily.        Tab aspirin 81 mg  1 tab po daily  Albuterol inhaler  2 puffs q6 Hr prn for SOB or wheezing   Allergies  Allergen Reactions  . Other     novocaine broke mouth out  . Codeine   . Penicillins        Follow-up Information   Follow up with MICHAELS,CHASE A, PA-C. Schedule an appointment as soon as possible for a visit in 1 week.   Specialty:  General Practice   Contact information:   [address] [place] [address] [phone]        The results of significant diagnostics from this hospitalization (including imaging, microbiology, ancillary and laboratory) are listed below for reference.    Significant Diagnostic Studies: Dg Chest Port 1 View  06/09/2013   CLINICAL DATA:  Shortness of breath and hypertension.  EXAM: PORTABLE CHEST - 1 VIEW  COMPARISON:  04/23/2009  FINDINGS: Single view of the chest demonstrates diffuse lung densities that are new from the prior examination. The lung densities are most suggestive for edema. In addition, the heart appears to be upper limits of normal. Old left rib fractures.  IMPRESSION: Cardiomegaly with diffuse lung densities. Findings are concerning for pulmonary edema and cannot exclude CHF.   Electronically Signed   By: Richarda Overlie M.D.   On: 06/09/2013 17:01    Microbiology: No results found for this or any previous visit (from the past 240 hour(s)).   Labs: Basic Metabolic Panel:  Recent Labs Lab 06/09/13 1645 06/09/13 2019 06/10/13 0231 06/11/13 0540  NA 135*  --  134* 142  K 3.6*  --  3.1* 3.8  CL 98  --  94* 101  CO2 24  --  29 28  GLUCOSE 256*  --  237* 117*  BUN 21  --  20 33*  CREATININE 1.17*  --  1.14* 1.56*  CALCIUM 9.1  --  8.8 8.9  MG  --  1.9  --   --   PHOS  --  3.4  --   --    Liver Function Tests:  Recent Labs Lab 06/09/13 1645  AST 20  ALT 19  ALKPHOS 101  BILITOT 0.7   PROT 6.6  ALBUMIN 3.3*   No results found for this basename: LIPASE, AMYLASE,  in the last 168 hours No results found for this basename: AMMONIA,  in the last 168 hours CBC:  Recent Labs Lab 06/09/13 1645 06/10/13 0231  WBC 11.0* 12.7*  NEUTROABS 9.8*  --   HGB 12.6 11.7*  HCT 37.5 35.4*  MCV 86.0 85.7  PLT 225  229   Cardiac Enzymes:  Recent Labs Lab 06/09/13 1645 06/09/13 2019 06/10/13 0231  TROPONINI <0.30 <0.30 <0.30   BNP: BNP (last 3 results)  Recent Labs  06/09/13 1645 06/10/13 0231  PROBNP 1483.0* 2374.0*   CBG:  Recent Labs Lab 06/10/13 1126 06/10/13 1723 06/10/13 2149 06/11/13 0742 06/11/13 1157  GLUCAP 181* 137* 257* 128* 258*       Signed:  Kacie Huxtable  [facility] 06/11/2013, 12:46 PM

## 2013-07-08 ENCOUNTER — Ambulatory Visit (INDEPENDENT_AMBULATORY_CARE_PROVIDER_SITE_OTHER): Payer: Medicare Other | Admitting: Internal Medicine

## 2013-07-08 ENCOUNTER — Encounter: Payer: Self-pay | Admitting: Internal Medicine

## 2013-07-08 VITALS — BP 176/66 | HR 63 | Ht 62.0 in | Wt 147.1 lb

## 2013-07-08 DIAGNOSIS — I5030 Unspecified diastolic (congestive) heart failure: Secondary | ICD-10-CM

## 2013-07-08 DIAGNOSIS — E785 Hyperlipidemia, unspecified: Secondary | ICD-10-CM

## 2013-07-08 DIAGNOSIS — E119 Type 2 diabetes mellitus without complications: Secondary | ICD-10-CM

## 2013-07-08 DIAGNOSIS — E1165 Type 2 diabetes mellitus with hyperglycemia: Secondary | ICD-10-CM

## 2013-07-08 DIAGNOSIS — IMO0001 Reserved for inherently not codable concepts without codable children: Secondary | ICD-10-CM

## 2013-07-08 DIAGNOSIS — I10 Essential (primary) hypertension: Secondary | ICD-10-CM

## 2013-07-08 DIAGNOSIS — Z79899 Other long term (current) drug therapy: Secondary | ICD-10-CM

## 2013-07-08 DIAGNOSIS — R0902 Hypoxemia: Secondary | ICD-10-CM | POA: Insufficient documentation

## 2013-07-08 DIAGNOSIS — R0602 Shortness of breath: Secondary | ICD-10-CM

## 2013-07-08 DIAGNOSIS — I509 Heart failure, unspecified: Secondary | ICD-10-CM

## 2013-07-08 MED ORDER — AMLODIPINE BESYLATE 10 MG PO TABS: 10.0000 mg | ORAL_TABLET | Freq: Every day | ORAL | Status: DC

## 2013-07-08 NOTE — Patient Instructions (Addendum)
Your physician has requested that you have a lexiscan myoview. For further information please visit [email or web address] Please follow instruction sheet, as given.  Your physician has recommended you make the following change in your medication: START amlodipine 10mg  once daily  Please have fasting blood work at you earliest convenience.   Your physician recommends that you schedule a follow-up appointment after your tests.

## 2013-07-08 NOTE — Progress Notes (Signed)
OFFICE NOTE  Chief Complaint:  Shortness of breath, wants to establish a cardiologist  Primary Care Physician: Marshia Ly, PA-C  HPI:  Alexis [name] is a pleasant 80 year old female who was recently seen at any and for heart failure exacerbation. She presented at this couple of weeks of increasing shortness of breath, cough, increasing abdominal girth and lower extremity edema. On admission her BNP was elevated and she responded fairly quickly to diuresis. This caused improvement in her breathing. She was noted to be hypertensive and continues to be hypertensive today. She has had some acute on chronic renal insufficiency.  She reports that when she was discharged she was sent home on oxygen, but was never told why she needed it. She followed up with her primary care provider, who is with [facility] and he recommended that she see a cardiologist to help determine whether she needs to continue to take oxygen. She apparently sought out our group and on seeing her today.  Her past medical history is significant for hypertension, diabetes, dyslipidemia and family history of heart disease.  She reports that since discharge her breathing is better, however when she does marked exertion she does feel short of breath. She is not using her oxygen. She denies any chest pain.  An echocardiogram performed at [facility] showed an EF of 65-70%, with moderate concentric LVH, and no wall motion abnormalities.  She's never had a stress test evaluation.  PMHx:  Past Medical History  Diagnosis Date  . Diabetes mellitus without complication   . Hypertension   . Neuropathy   . Anxiety   . PONV (postoperative nausea and vomiting)     Past Surgical History  Procedure Laterality Date  . Hip fracture surgery    . Abdominal hysterectomy      FAMHx:  No family history on file.  SOCHx:   reports that she has never smoked. She has never used smokeless tobacco. She reports that she does not  drink alcohol or use illicit drugs.  ALLERGIES:  Allergies  Allergen Reactions  . Other     novocaine broke mouth out  . Codeine   . Penicillins     ROS: A comprehensive review of systems was negative except for: Respiratory: positive for dyspnea on exertion  HOME MEDS: Current Outpatient Prescriptions  Medication Sig Dispense Refill  . albuterol (PROVENTIL HFA;VENTOLIN HFA) 108 (90 BASE) MCG/ACT inhaler Inhale 2 puffs into the lungs every 6 (six) hours as needed for wheezing or shortness of breath.  1 Inhaler  3  . aspirin EC 81 MG tablet Take 1 tablet (81 mg total) by mouth daily.  30 tablet  0  . escitalopram (LEXAPRO) 10 MG tablet Take 10 mg by mouth daily.      . fenofibrate 160 MG tablet Take 160 mg by mouth daily.      . furosemide (LASIX) 40 MG tablet Take 1 tablet (40 mg total) by mouth daily.  30 tablet  0  . gabapentin (NEURONTIN) 100 MG capsule Take 100-200 mg by mouth 3 (three) times daily. 100mg  TID and 200mg  QHS      . glipiZIDE (GLUCOTROL XL) 10 MG 24 hr tablet Take 10 mg by mouth 2 (two) times daily.      . metoprolol tartrate (LOPRESSOR) 12.5 mg TABS tablet Take 0.5 tablets (12.5 mg total) by mouth 2 (two) times daily.  60 tablet  0  . SitaGLIPtin Phosphate (JANUVIA PO) Take 30 mg by mouth daily.      Marland Kitchen  terbinafine (LAMISIL) 250 MG tablet Take 250 mg by mouth daily.      Marland Kitchen. amLODipine (NORVASC) 10 MG tablet Take 1 tablet (10 mg total) by mouth daily.  90 tablet  2   No current facility-administered medications for this visit.    LABS/IMAGING: No results found for this or any previous visit (from the past 48 hour(s)). No results found.  VITALS: BP 176/66  Pulse 63  Ht 5\' 2"  (1.575 m)  Wt 147 lb 1.6 oz (66.724 kg)  BMI 26.90 kg/m2  SpO2 94%  Ambulatory oxygen saturation: O2 saturation at rest was 94%, which reduced to 79% with exercise  EXAM: General appearance: alert and no distress Neck: no carotid bruit and no JVD Lungs: diminished breath sounds  bibasilar Heart: regular rate and rhythm, S1, S2 normal, no murmur, click, rub or gallop Abdomen: soft, non-tender; bowel sounds normal; no masses,  no organomegaly Extremities: extremities normal, atraumatic, no cyanosis or edema Pulses: 2+ and symmetric Skin: Skin color, texture, turgor normal. No rashes or lesions Neurologic: Grossly normal Psych: Mood, affect normal  EKG: Normal sinus rhythm at 63  ASSESSMENT: 1. Recent acute diastolic heart failure exacerbation 2. Hypertensive heart disease with moderate LVH 3. Uncontrolled hypertension 4. Dyslipidemia 5. Diabetes type 2 6. Family history of heart disease  PLAN: 1.   Alexis [name] was recently admitted for diastolic heart failure. She's improved on diuretics, but still is hypoxic with exertion. This could be do to persistent volume overload. She is also hypertensive with blood pressures in the 170 systolic today. She does have chronic kidney disease stage III, with a GFR in the mid 30s.  I am hesitant to start her on an ACE inhibitor or ARB due to this. I would recommend adding amlodipine 10 mg daily.  She is ready Lopressor 6.25 mg twice daily. Heart rate is 63.  Based on her desaturation, she should wear her oxygen while exerting herself. I would recommend a LexiScan nuclear stress test to rule out ischemia. She may need additional diuresis at followup on her blood pressure is better established. If she continues to be hypoxic, and may refer her to pulmonary.  She was never a smoker.  Chrystie NoseKenneth C. Ellamay Fors, MD, [facility] HospitalFACC Attending Cardiologist [facility]  Jahniya Duzan C 07/08/2013, 6:27 PM

## 2013-07-14 ENCOUNTER — Encounter (HOSPITAL_COMMUNITY): Payer: Medicare Other

## 2013-07-15 ENCOUNTER — Encounter (HOSPITAL_COMMUNITY): Payer: Self-pay | Admitting: Emergency Medicine

## 2013-07-15 ENCOUNTER — Emergency Department (HOSPITAL_COMMUNITY): Payer: Medicare Other

## 2013-07-15 ENCOUNTER — Inpatient Hospital Stay (HOSPITAL_COMMUNITY)
Admission: EM | Admit: 2013-07-15 | Discharge: 2013-07-19 | DRG: 291 | Disposition: A | Discharge status: Home / Self Care | Payer: Medicare Other | Attending: Family Medicine | Admitting: Family Medicine

## 2013-07-15 DIAGNOSIS — G589 Mononeuropathy, unspecified: Secondary | ICD-10-CM | POA: Diagnosis present

## 2013-07-15 DIAGNOSIS — I129 Hypertensive chronic kidney disease with stage 1 through stage 4 chronic kidney disease, or unspecified chronic kidney disease: Secondary | ICD-10-CM | POA: Diagnosis present

## 2013-07-15 DIAGNOSIS — E872 Acidosis, unspecified: Secondary | ICD-10-CM | POA: Diagnosis present

## 2013-07-15 DIAGNOSIS — IMO0002 Reserved for concepts with insufficient information to code with codable children: Secondary | ICD-10-CM | POA: Diagnosis present

## 2013-07-15 DIAGNOSIS — Z885 Allergy status to narcotic agent status: Secondary | ICD-10-CM

## 2013-07-15 DIAGNOSIS — I5031 Acute diastolic (congestive) heart failure: Principal | ICD-10-CM | POA: Diagnosis present

## 2013-07-15 DIAGNOSIS — E876 Hypokalemia: Secondary | ICD-10-CM

## 2013-07-15 DIAGNOSIS — Z880 Allergy status to penicillin: Secondary | ICD-10-CM

## 2013-07-15 DIAGNOSIS — R0902 Hypoxemia: Secondary | ICD-10-CM

## 2013-07-15 DIAGNOSIS — N179 Acute kidney failure, unspecified: Secondary | ICD-10-CM | POA: Diagnosis present

## 2013-07-15 DIAGNOSIS — D72829 Elevated white blood cell count, unspecified: Secondary | ICD-10-CM | POA: Diagnosis present

## 2013-07-15 DIAGNOSIS — IMO0001 Reserved for inherently not codable concepts without codable children: Secondary | ICD-10-CM | POA: Diagnosis present

## 2013-07-15 DIAGNOSIS — I10 Essential (primary) hypertension: Secondary | ICD-10-CM | POA: Diagnosis present

## 2013-07-15 DIAGNOSIS — J811 Chronic pulmonary edema: Secondary | ICD-10-CM

## 2013-07-15 DIAGNOSIS — J9621 Acute and chronic respiratory failure with hypoxia: Secondary | ICD-10-CM

## 2013-07-15 DIAGNOSIS — Z66 Do not resuscitate: Secondary | ICD-10-CM | POA: Diagnosis present

## 2013-07-15 DIAGNOSIS — I160 Hypertensive urgency: Secondary | ICD-10-CM

## 2013-07-15 DIAGNOSIS — I509 Heart failure, unspecified: Secondary | ICD-10-CM

## 2013-07-15 DIAGNOSIS — J9622 Acute and chronic respiratory failure with hypercapnia: Secondary | ICD-10-CM

## 2013-07-15 DIAGNOSIS — Z7982 Long term (current) use of aspirin: Secondary | ICD-10-CM

## 2013-07-15 DIAGNOSIS — J9620 Acute and chronic respiratory failure, unspecified whether with hypoxia or hypercapnia: Secondary | ICD-10-CM | POA: Diagnosis present

## 2013-07-15 DIAGNOSIS — E1165 Type 2 diabetes mellitus with hyperglycemia: Secondary | ICD-10-CM | POA: Diagnosis present

## 2013-07-15 DIAGNOSIS — I5030 Unspecified diastolic (congestive) heart failure: Secondary | ICD-10-CM | POA: Diagnosis present

## 2013-07-15 DIAGNOSIS — N189 Chronic kidney disease, unspecified: Secondary | ICD-10-CM | POA: Diagnosis present

## 2013-07-15 DIAGNOSIS — Z79899 Other long term (current) drug therapy: Secondary | ICD-10-CM

## 2013-07-15 LAB — CBC WITH DIFFERENTIAL/PLATELET
Basophils Absolute: 0.1 K/uL (ref 0.0–0.1)
Basophils Relative: 0 % (ref 0–1)
Eosinophils Absolute: 0.4 10*3/uL (ref 0.0–0.7)
Eosinophils Relative: 2 % (ref 0–5)
HCT: 41.3 % (ref 36.0–46.0)
Hemoglobin: 13.5 g/dL (ref 12.0–15.0)
Lymphocytes Relative: 16 % (ref 12–46)
Lymphs Abs: 2.8 10*3/uL (ref 0.7–4.0)
MCH: 29 pg (ref 26.0–34.0)
MCHC: 32.7 g/dL (ref 30.0–36.0)
MCV: 88.6 fL (ref 78.0–100.0)
Monocytes Absolute: 0.7 10*3/uL (ref 0.1–1.0)
Monocytes Relative: 4 % (ref 3–12)
Neutro Abs: 13.3 10*3/uL — ABNORMAL HIGH (ref 1.7–7.7)
Neutrophils Relative %: 77 % (ref 43–77)
Platelets: 305 10*3/uL (ref 150–400)
RBC: 4.66 MIL/uL (ref 3.87–5.11)
RDW: 16.1 % — ABNORMAL HIGH (ref 11.5–15.5)
WBC: 17.3 10*3/uL — ABNORMAL HIGH (ref 4.0–10.5)

## 2013-07-15 LAB — I-STAT ARTERIAL BLOOD GAS, ED
Acid-base deficit: 2 mmol/L (ref 0.0–2.0)
Bicarbonate: 28.4 meq/L — ABNORMAL HIGH (ref 20.0–24.0)
O2 Saturation: 98 %
Patient temperature: 98.4
TCO2: 31 mmol/L (ref 0–100)
pCO2 arterial: 75.5 mmHg (ref 35.0–45.0)
pH, Arterial: 7.183 — CL (ref 7.350–7.450)
pO2, Arterial: 132 mmHg — ABNORMAL HIGH (ref 80.0–100.0)

## 2013-07-15 LAB — COMPREHENSIVE METABOLIC PANEL
Albumin: 3.6 g/dL (ref 3.5–5.2)
BUN: 25 mg/dL — ABNORMAL HIGH (ref 6–23)
Calcium: 9.3 mg/dL (ref 8.4–10.5)
Chloride: 102 mEq/L (ref 96–112)
Creatinine, Ser: 1.29 mg/dL — ABNORMAL HIGH (ref 0.50–1.10)
Glucose, Bld: 269 mg/dL — ABNORMAL HIGH (ref 70–99)
Potassium: 4.1 mEq/L (ref 3.7–5.3)
Total Bilirubin: 0.4 mg/dL (ref 0.3–1.2)
Total Protein: 7.5 g/dL (ref 6.0–8.3)

## 2013-07-15 LAB — I-STAT TROPONIN, ED: Troponin i, poc: 0 ng/mL (ref 0.00–0.08)

## 2013-07-15 LAB — COMPREHENSIVE METABOLIC PANEL WITH GFR
ALT: 21 U/L (ref 0–35)
AST: 34 U/L (ref 0–37)
Alkaline Phosphatase: 92 U/L (ref 39–117)
CO2: 25 meq/L (ref 19–32)
GFR calc Af Amer: 44 mL/min — ABNORMAL LOW (ref >90)
GFR calc non Af Amer: 38 mL/min — ABNORMAL LOW (ref >90)
Sodium: 142 meq/L (ref 137–147)

## 2013-07-15 LAB — PRO B NATRIURETIC PEPTIDE: Pro B Natriuretic peptide (BNP): 1598 pg/mL — ABNORMAL HIGH (ref 0–450)

## 2013-07-15 MED ORDER — NITROGLYCERIN IN D5W 200-5 MCG/ML-% IV SOLN
2.0000 ug/min | Freq: Once | INTRAVENOUS | Status: AC
  Administered 2013-07-15: 20 ug/min via INTRAVENOUS

## 2013-07-15 MED ORDER — ALBUTEROL SULFATE (2.5 MG/3ML) 0.083% IN NEBU
5.0000 mg | INHALATION_SOLUTION | Freq: Once | RESPIRATORY_TRACT | Status: AC
  Administered 2013-07-15: 5 mg via RESPIRATORY_TRACT
  Filled 2013-07-15: qty 6

## 2013-07-15 MED ORDER — NITROGLYCERIN IN D5W 200-5 MCG/ML-% IV SOLN
INTRAVENOUS | Status: AC
  Filled 2013-07-15: qty 250

## 2013-07-15 MED ORDER — FUROSEMIDE 10 MG/ML IJ SOLN
40.0000 mg | Freq: Once | INTRAMUSCULAR | Status: AC
  Administered 2013-07-15: 40 mg via INTRAVENOUS
  Filled 2013-07-15: qty 4

## 2013-07-15 NOTE — ED Notes (Signed)
Pt placed on BiPap

## 2013-07-15 NOTE — Progress Notes (Signed)
Pt placed on 5LNC post nebulizer treatment.  Pt currently tolerating well with saturations of 96%.  Pt weaned from bipap per MD.

## 2013-07-15 NOTE — ED Notes (Signed)
Pt shaking her head and answering appropriately when asked if her breathing was better.

## 2013-07-15 NOTE — ED Notes (Signed)
Pt from home with c/o respiratory distress. Per EMS pt was by herself for 3 hours, son found her at home cyanotic at the lips.  Upon EMS arrival, pt was 67% on 4L [place].  Pt placed on NRB and was sating 86%.  Pt on C-PAP upon arrival to ED.  Pt not following commands on arrival.

## 2013-07-15 NOTE — ED Notes (Signed)
ABG given to Dr. Oletta Lamas.

## 2013-07-15 NOTE — ED Notes (Signed)
Pt given 1 nitro with EMS PTA 220/130.  After nitro 210/110.

## 2013-07-15 NOTE — ED Notes (Signed)
EDP made aware of pt's BP and nitro drip being discontinued.

## 2013-07-15 NOTE — ED Provider Notes (Signed)
CSN: [id number]     Arrival date & time 07/15/13  2003 History   First MD Initiated Contact with Patient 07/15/13 2010     Chief Complaint  Patient presents with  . Respiratory Distress     (Consider location/radiation/quality/duration/timing/severity/associated sxs/prior Treatment) HPI Comments: Pt was alone at home for a few hours, son came back home, she was in chair, blue, respiratory distress.  Pt has a h/o pulmonary edema in the past per son.  Pt's cardiologist is Dr. [name], PCP is with [facility] clinic.  No recent illness per son.  Level 5 caveat due to severe acute condition.  EMS gave 1 SL NTG, placed on CPAP.  Pt's mental status has been waxing and waning and not taking very deep breaths per EMS.    Patient is a 80 y.o. female presenting with shortness of breath. The history is provided by the EMS personnel and a relative. The history is limited by the condition of the patient.  Shortness of Breath Severity:  Severe Onset quality:  Sudden Duration:  3 hours Timing:  Constant Progression:  Worsening Chronicity:  New Relieved by:  Nothing   Past Medical History  Diagnosis Date  . Diabetes mellitus without complication   . Hypertension   . Neuropathy   . Anxiety   . PONV (postoperative nausea and vomiting)   . CHF (congestive heart failure)    Past Surgical History  Procedure Laterality Date  . Hip fracture surgery    . Abdominal hysterectomy     History reviewed. No pertinent family history. History  Substance Use Topics  . Smoking status: Never Smoker   . Smokeless tobacco: Never Used  . Alcohol Use: No   OB History   Grav Para Term Preterm Abortions TAB SAB Ect Mult Living                 Review of Systems  Unable to perform ROS: Acuity of condition  Respiratory: Positive for shortness of breath.       Allergies  Other; Codeine; Penicillins; and Procaine  Home Medications   Current Outpatient Rx  Name  Route  Sig  Dispense  Refill  . albuterol  (PROVENTIL HFA;VENTOLIN HFA) 108 (90 BASE) MCG/ACT inhaler   Inhalation   Inhale 2 puffs into the lungs every 6 (six) hours as needed for wheezing or shortness of breath.   1 Inhaler   3   . amLODipine (NORVASC) 10 MG tablet   Oral   Take 10 mg by mouth daily.         Marland Kitchen aspirin EC 81 MG tablet   Oral   Take 81 mg by mouth at bedtime.         . cholecalciferol (VITAMIN D) 1000 UNITS tablet   Oral   Take 1,000 Units by mouth at bedtime.          Marland Kitchen escitalopram (LEXAPRO) 10 MG tablet   Oral   Take 10 mg by mouth daily.         . fenofibrate 160 MG tablet   Oral   Take 160 mg by mouth daily.         . furosemide (LASIX) 40 MG tablet   Oral   Take 1 tablet (40 mg total) by mouth daily.   30 tablet   0   . gabapentin (NEURONTIN) 100 MG capsule   Oral   Take 100-200 mg by mouth 3 (three) times daily. Take 1 capsule (100 mg) morning and mid  day, take 2 capsules (200 mg) at bedtime         . glipiZIDE (GLUCOTROL XL) 10 MG 24 hr tablet   Oral   Take 10 mg by mouth daily with breakfast.          . metoprolol tartrate (LOPRESSOR) 25 MG tablet   Oral   Take 12.5 mg by mouth 2 (two) times daily.         . Multiple Vitamin (MULTIVITAMIN WITH MINERALS) TABS tablet   Oral   Take 1 tablet by mouth daily.         . sitaGLIPtin (JANUVIA) 50 MG tablet   Oral   Take 50 mg by mouth at bedtime.         . terbinafine (LAMISIL) 250 MG tablet   Oral   Take 250 mg by mouth daily.         Marland Kitchen glucose blood test strip      Use one strip to test glucose three times a day. Dx: 250.00          BP 98/47  Pulse 74  Temp(Src) 98.4 F (36.9 C) (Axillary)  Resp 25  SpO2 99% Physical Exam  Nursing note and vitals reviewed. Constitutional: She appears well-developed and well-nourished. She appears listless. She is uncooperative. She has a sickly appearance. She appears ill. She appears distressed.  HENT:  Head: Normocephalic and atraumatic.  Eyes: Conjunctivae  are normal. No scleral icterus.  Neck: Neck supple. JVD present.  Cardiovascular: Normal rate and regular rhythm.   Pulmonary/Chest: She is in respiratory distress. She has rales.  Abdominal: Soft. She exhibits no distension. There is no tenderness.  Neurological: She appears listless.  Skin: She is diaphoretic. There is cyanosis. There is pallor.  Psychiatric: She is agitated.    ED Course  Procedures (including critical care time)  CRITICAL CARE Performed by: Lear Ng. Total critical care time: 30 min Critical care time was exclusive of separately billable procedures and treating other patients. Critical care was necessary to treat or prevent imminent or life-threatening deterioration. Critical care was time spent personally by me on the following activities: development of treatment plan with patient and/or surrogate as well as nursing, discussions with consultants, evaluation of patient's response to treatment, examination of patient, obtaining history from patient or surrogate, ordering and performing treatments and interventions, ordering and review of laboratory studies, ordering and review of radiographic studies, pulse oximetry and re-evaluation of patient's condition.   Labs Review Labs Reviewed  CBC WITH DIFFERENTIAL - Abnormal; Notable for the following:    WBC 17.3 (*)    RDW 16.1 (*)    Neutro Abs 13.3 (*)    All other components within normal limits  COMPREHENSIVE METABOLIC PANEL - Abnormal; Notable for the following:    Glucose, Bld 269 (*)    BUN 25 (*)    Creatinine, Ser 1.29 (*)    GFR calc non Af Amer 38 (*)    GFR calc Af Amer 44 (*)    All other components within normal limits  PRO B NATRIURETIC PEPTIDE - Abnormal; Notable for the following:    Pro B Natriuretic peptide (BNP) 1598.0 (*)    All other components within normal limits  I-STAT ARTERIAL BLOOD GAS, ED - Abnormal; Notable for the following:    pH, Arterial 7.183 (*)    pCO2 arterial 75.5  (*)    pO2, Arterial 132.0 (*)    Bicarbonate 28.4 (*)    All other components  within normal limits  BLOOD GAS, VENOUS  I-STAT TROPOININ, ED   Imaging Review Dg Chest Port 1 View  07/15/2013   CLINICAL DATA:  Shortness of breath.  Respiratory distress.  EXAM: PORTABLE CHEST - 1 VIEW  COMPARISON:  06/09/2013  FINDINGS: Cardiomegaly. Diffuse bilateral interstitial and alveolar opacities compatible with edema/ CHF. Hyperinflation of the lungs. Suspect small layering effusions.  IMPRESSION: COPD.  Moderate CHF changes.  Small bilateral effusions.   Electronically Signed   By: Charlett NoseKevin  Dover M.D.   On: 07/15/2013 22:04    EKG Interpretation    Date/Time:  Thursday July 15 2013 20:09:04 EST Ventricular Rate:  108 PR Interval:  165 QRS Duration: 91 QT Interval:  347 QTC Calculation: 465 R Axis:   26 Text Interpretation:  Sinus tachycardia Ventricular premature complex Probable left atrial enlargement Repol abnrm suggests ischemia, anterolateral Poor data quality Abnormal ekg Confirmed by [facility]GHIM  MD, MICHEAL ([id number]) on 07/15/2013 10:21:32 PM           O2 sats are 60's initially when briefly off of CPAP, which I interpret to be abn   Pt's color, mentation, subjective dyspnea improved after IV NTG and BiPAP.  Pt is interactive, talkative, denies recent CP.  Pt's O2 sats are 98% and improved.    10:27 PM Despite initial poor ABG, with acidosis and elevated pCO2, clinically, pt has improed drastically, no longer HTN.  NTG gtt is now off.  Will repeat a VBG for comparison, but I think pt can be weaned and likely admission to telemetry.    MDM   Final diagnoses:  CHF (congestive heart failure)  Hypertensive urgency  Pulmonary edema    Pt likely in flash pulmonary edema realted to HTN.  Pt's initial BP is 200/95, rales on exam, listless on exam with poor mentation.  Pt will look at me when I speak to her, not follow commands.  Doesn't try to verbalize.  Skin is pale, diaphoretic.  Will  start IV NTG and BiPAP.      Gavin PoundMichael Y. Oletta LamasGhim, MD 07/16/13 [phone]

## 2013-07-15 NOTE — ED Notes (Signed)
Pt currently following commands.  Initially not following on arrival.

## 2013-07-16 ENCOUNTER — Telehealth (HOSPITAL_COMMUNITY): Payer: Self-pay | Admitting: *Deleted

## 2013-07-16 DIAGNOSIS — I5030 Unspecified diastolic (congestive) heart failure: Secondary | ICD-10-CM | POA: Diagnosis present

## 2013-07-16 DIAGNOSIS — I509 Heart failure, unspecified: Secondary | ICD-10-CM

## 2013-07-16 DIAGNOSIS — J9621 Acute and chronic respiratory failure with hypoxia: Secondary | ICD-10-CM

## 2013-07-16 DIAGNOSIS — IMO0001 Reserved for inherently not codable concepts without codable children: Secondary | ICD-10-CM

## 2013-07-16 DIAGNOSIS — J9620 Acute and chronic respiratory failure, unspecified whether with hypoxia or hypercapnia: Secondary | ICD-10-CM

## 2013-07-16 DIAGNOSIS — I10 Essential (primary) hypertension: Secondary | ICD-10-CM

## 2013-07-16 DIAGNOSIS — E1165 Type 2 diabetes mellitus with hyperglycemia: Secondary | ICD-10-CM

## 2013-07-16 LAB — BASIC METABOLIC PANEL
BUN: 28 mg/dL — ABNORMAL HIGH (ref 6–23)
CO2: 26 meq/L (ref 19–32)
Calcium: 8.8 mg/dL (ref 8.4–10.5)
Chloride: 104 mEq/L (ref 96–112)
Creatinine, Ser: 1.52 mg/dL — ABNORMAL HIGH (ref 0.50–1.10)
GFR calc Af Amer: 36 mL/min — ABNORMAL LOW (ref >90)
GFR calc non Af Amer: 31 mL/min — ABNORMAL LOW (ref >90)
GLUCOSE: 167 mg/dL — AB (ref 70–99)
POTASSIUM: 3.9 meq/L (ref 3.7–5.3)
Sodium: 141 mEq/L (ref 137–147)

## 2013-07-16 LAB — I-STAT ARTERIAL BLOOD GAS, ED
Acid-Base Excess: 1 mmol/L (ref 0.0–2.0)
BICARBONATE: 27.1 meq/L — AB (ref 20.0–24.0)
O2 Saturation: 96 %
PO2 ART: 86 mmHg (ref 80.0–100.0)
TCO2: 29 mmol/L (ref 0–100)
pCO2 arterial: 49.2 mmHg — ABNORMAL HIGH (ref 35.0–45.0)
pH, Arterial: 7.35 (ref 7.350–7.450)

## 2013-07-16 LAB — CBC
HCT: 32.8 % — ABNORMAL LOW (ref 36.0–46.0)
HEMOGLOBIN: 10.8 g/dL — AB (ref 12.0–15.0)
MCH: 28.8 pg (ref 26.0–34.0)
MCHC: 32.9 g/dL (ref 30.0–36.0)
MCV: 87.5 fL (ref 78.0–100.0)
Platelets: 204 10*3/uL (ref 150–400)
RBC: 3.75 MIL/uL — AB (ref 3.87–5.11)
RDW: 16.3 % — ABNORMAL HIGH (ref 11.5–15.5)
WBC: 9.5 10*3/uL (ref 4.0–10.5)

## 2013-07-16 LAB — GLUCOSE, CAPILLARY
Glucose-Capillary: 161 mg/dL — ABNORMAL HIGH (ref 70–99)
Glucose-Capillary: 120 mg/dL — ABNORMAL HIGH (ref 70–99)
Glucose-Capillary: 97 mg/dL (ref 70–99)
Glucose-Capillary: 113 mg/dL — ABNORMAL HIGH (ref 70–99)

## 2013-07-16 MED ORDER — ACETAMINOPHEN 325 MG PO TABS: 650.0000 mg | ORAL_TABLET | Freq: Four times a day (QID) | ORAL | Status: DC | PRN

## 2013-07-16 MED ORDER — ONDANSETRON HCL 4 MG PO TABS: 4.0000 mg | ORAL_TABLET | Freq: Four times a day (QID) | ORAL | Status: DC | PRN

## 2013-07-16 MED ORDER — ADULT MULTIVITAMIN W/MINERALS CH
1.0000 | ORAL_TABLET | Freq: Every day | ORAL | Status: DC
  Administered 2013-07-16 – 2013-07-19 (×4): 1 via ORAL
  Filled 2013-07-16 (×4): qty 1

## 2013-07-16 MED ORDER — AMLODIPINE BESYLATE 10 MG PO TABS
10.0000 mg | ORAL_TABLET | Freq: Every day | ORAL | Status: DC
  Administered 2013-07-16 – 2013-07-19 (×4): 10 mg via ORAL
  Filled 2013-07-16 (×4): qty 1

## 2013-07-16 MED ORDER — VITAMIN D3 25 MCG (1000 UNIT) PO TABS
1000.0000 [IU] | ORAL_TABLET | Freq: Every day | ORAL | Status: DC
  Administered 2013-07-16 – 2013-07-18 (×3): 1000 [IU] via ORAL
  Filled 2013-07-16 (×4): qty 1

## 2013-07-16 MED ORDER — METOPROLOL TARTRATE 12.5 MG HALF TABLET
12.5000 mg | ORAL_TABLET | Freq: Two times a day (BID) | ORAL | Status: DC
  Administered 2013-07-16 – 2013-07-19 (×7): 12.5 mg via ORAL
  Filled 2013-07-16 (×8): qty 1

## 2013-07-16 MED ORDER — FUROSEMIDE 10 MG/ML IJ SOLN
40.0000 mg | Freq: Two times a day (BID) | INTRAMUSCULAR | Status: DC
  Administered 2013-07-16 (×2): 40 mg via INTRAVENOUS
  Filled 2013-07-16 (×3): qty 4

## 2013-07-16 MED ORDER — DOCUSATE SODIUM 100 MG PO CAPS
100.0000 mg | ORAL_CAPSULE | Freq: Two times a day (BID) | ORAL | Status: DC
  Administered 2013-07-16 – 2013-07-19 (×7): 100 mg via ORAL
  Filled 2013-07-16 (×8): qty 1

## 2013-07-16 MED ORDER — INSULIN ASPART 100 UNIT/ML ~~LOC~~ SOLN
0.0000 [IU] | Freq: Three times a day (TID) | SUBCUTANEOUS | Status: DC
  Administered 2013-07-16: 2 [IU] via SUBCUTANEOUS
  Administered 2013-07-17: 1 [IU] via SUBCUTANEOUS
  Administered 2013-07-18 – 2013-07-19 (×3): 2 [IU] via SUBCUTANEOUS

## 2013-07-16 MED ORDER — GABAPENTIN 100 MG PO CAPS
200.0000 mg | ORAL_CAPSULE | Freq: Every day | ORAL | Status: DC
  Administered 2013-07-16 – 2013-07-18 (×3): 200 mg via ORAL
  Filled 2013-07-16 (×4): qty 2

## 2013-07-16 MED ORDER — SENNA 8.6 MG PO TABS
1.0000 | ORAL_TABLET | Freq: Two times a day (BID) | ORAL | Status: DC
  Administered 2013-07-16 – 2013-07-18 (×6): 8.6 mg via ORAL
  Filled 2013-07-16 (×8): qty 1

## 2013-07-16 MED ORDER — HEPARIN SODIUM (PORCINE) 5000 UNIT/ML IJ SOLN
5000.0000 [IU] | Freq: Three times a day (TID) | INTRAMUSCULAR | Status: DC
  Administered 2013-07-16 – 2013-07-19 (×10): 5000 [IU] via SUBCUTANEOUS
  Filled 2013-07-16 (×13): qty 1

## 2013-07-16 MED ORDER — ESCITALOPRAM OXALATE 10 MG PO TABS
10.0000 mg | ORAL_TABLET | Freq: Every day | ORAL | Status: DC
  Administered 2013-07-16 – 2013-07-19 (×4): 10 mg via ORAL
  Filled 2013-07-16 (×4): qty 1

## 2013-07-16 MED ORDER — GABAPENTIN 100 MG PO CAPS
100.0000 mg | ORAL_CAPSULE | ORAL | Status: DC
  Administered 2013-07-16 – 2013-07-19 (×8): 100 mg via ORAL
  Filled 2013-07-16 (×10): qty 1

## 2013-07-16 MED ORDER — BIOTENE DRY MOUTH MT LIQD
15.0000 mL | Freq: Two times a day (BID) | OROMUCOSAL | Status: DC
  Administered 2013-07-16 – 2013-07-19 (×7): 15 mL via OROMUCOSAL

## 2013-07-16 MED ORDER — ONDANSETRON HCL 4 MG/2ML IJ SOLN: 4.0000 mg | Freq: Four times a day (QID) | INTRAMUSCULAR | Status: DC | PRN

## 2013-07-16 MED ORDER — ASPIRIN EC 81 MG PO TBEC
81.0000 mg | DELAYED_RELEASE_TABLET | Freq: Every day | ORAL | Status: DC
  Administered 2013-07-16 – 2013-07-18 (×3): 81 mg via ORAL
  Filled 2013-07-16 (×4): qty 1

## 2013-07-16 MED ORDER — IPRATROPIUM BROMIDE 0.02 % IN SOLN
0.5000 mg | Freq: Two times a day (BID) | RESPIRATORY_TRACT | Status: DC
  Administered 2013-07-17: 0.5 mg via RESPIRATORY_TRACT
  Filled 2013-07-16 (×2): qty 2.5

## 2013-07-16 MED ORDER — HYDRALAZINE HCL 20 MG/ML IJ SOLN: 10.0000 mg | Freq: Four times a day (QID) | INTRAMUSCULAR | Status: DC | PRN

## 2013-07-16 MED ORDER — IPRATROPIUM BROMIDE 0.02 % IN SOLN
0.5000 mg | Freq: Four times a day (QID) | RESPIRATORY_TRACT | Status: DC
  Administered 2013-07-16 (×3): 0.5 mg via RESPIRATORY_TRACT
  Filled 2013-07-16 (×3): qty 2.5

## 2013-07-16 MED ORDER — FENOFIBRATE 160 MG PO TABS
160.0000 mg | ORAL_TABLET | Freq: Every day | ORAL | Status: DC
  Administered 2013-07-16 – 2013-07-19 (×4): 160 mg via ORAL
  Filled 2013-07-16 (×4): qty 1

## 2013-07-16 MED ORDER — ACETAMINOPHEN 650 MG RE SUPP: 650.0000 mg | Freq: Four times a day (QID) | RECTAL | Status: DC | PRN

## 2013-07-16 MED ORDER — ALBUTEROL SULFATE (2.5 MG/3ML) 0.083% IN NEBU
2.5000 mg | INHALATION_SOLUTION | Freq: Four times a day (QID) | RESPIRATORY_TRACT | Status: DC
  Administered 2013-07-16 (×3): 2.5 mg via RESPIRATORY_TRACT
  Filled 2013-07-16 (×3): qty 3

## 2013-07-16 MED ORDER — ALBUTEROL SULFATE (2.5 MG/3ML) 0.083% IN NEBU
2.5000 mg | INHALATION_SOLUTION | Freq: Two times a day (BID) | RESPIRATORY_TRACT | Status: DC
  Administered 2013-07-17: 2.5 mg via RESPIRATORY_TRACT
  Filled 2013-07-16 (×2): qty 3

## 2013-07-16 MED ORDER — GABAPENTIN 100 MG PO CAPS: 100.0000 mg | ORAL_CAPSULE | Freq: Three times a day (TID) | ORAL | Status: DC

## 2013-07-16 MED ORDER — ALBUTEROL SULFATE (2.5 MG/3ML) 0.083% IN NEBU: 2.5000 mg | INHALATION_SOLUTION | RESPIRATORY_TRACT | Status: DC | PRN

## 2013-07-16 NOTE — Progress Notes (Signed)
Physical Therapy Evaluation Patient Details Name: Alexis GivensChristine E [name] MRN: [id number] DOB: 01/31/1932 Today's Date: 07/16/2013 Time: [id number] PT Time Calculation (min): 37 min  PT Assessment / Plan / Recommendation History of Present Illness  80 yo female admitted with resp failure, HTN urgency, COPD, CHF. Hx of DM, neuropathy, HTN, anxiety.   Clinical Impression  Patient presents with problems listed below.  Will benefit from acute PT to maximize functional independence/safety prior to discharge home.  Patient had 5 falls in the last 2 months.  Recommend HHPT for balance and mobility training at discharge.    PT Assessment  Patient needs continued PT services    Follow Up Recommendations  Home health PT;Supervision - Intermittent    Does the patient have the potential to tolerate intense rehabilitation      Barriers to Discharge Decreased caregiver support Patient lives with son who works during day.  Does have a "life alert" system    Equipment Recommendations  3in1 (PT) Animal nutritionist(Shower seat)    Recommendations for Other Services     Frequency Min 3X/week    Precautions / Restrictions Precautions Precautions: Fall Precaution Comments: Patient has had 5 falls in last 2 months, all outside without her RW.  Educated patient to ALWAYS use her RW outside for safety. Restrictions Weight Bearing Restrictions: No   Pertinent Vitals/Pain       Mobility  Bed Mobility Overal bed mobility: Modified Independent General bed mobility comments: Patient moves supine <> sit with rail with increased time. Transfers Overall transfer level: Modified independent Equipment used: Rolling walker (2 wheeled) General transfer comment: Patient able to move to standing position from bed and toilet safely.  Uses RW some of the time.  Other times, moves with no assistive device. Ambulation/Gait Ambulation/Gait assistance: Supervision Ambulation Distance (Feet): 200 Feet Assistive device: Rolling  walker (2 wheeled) Gait Pattern/deviations: Step-through pattern;Decreased step length - right;Decreased step length - left Gait velocity: Slow gait speed General Gait Details: Verbal cues for safe use of RW.  Encouraged patient to use RW during gait for balance.  Must use RW when ambulating outside of house on uneven surfaces.      Exercises     PT Diagnosis: Abnormality of gait;Generalized weakness  PT Problem List: Decreased strength;Decreased balance;Decreased mobility;Decreased safety awareness;Cardiopulmonary status limiting activity PT Treatment Interventions: DME instruction;Gait training;Stair training;Functional mobility training;Balance training;Patient/family education     PT Goals(Current goals can be found in the care plan section) Acute Rehab PT Goals Patient Stated Goal: To go home soon PT Goal Formulation: With patient Time For Goal Achievement: 07/23/13 Potential to Achieve Goals: Good  Visit Information  Last PT Received On: 07/16/13 Assistance Needed: +1 History of Present Illness: 80 yo female admitted with resp failure, HTN urgency, COPD, CHF. Hx of DM, neuropathy, HTN, anxiety.        Prior Functioning  Home Living Family/patient expects to be discharged to:: Private residence Living Arrangements: Children Available Help at Discharge: Family;Available PRN/intermittently (Son works during day) Type of Home: House Home Access: Stairs to enter Secretary/administratorntrance Stairs-Number of Steps: 1+1 Entrance Stairs-Rails: None Home Layout: One level Home Equipment: Environmental consultantWalker - 2 wheels;Walker - 4 wheels Additional Comments: adjustable bed (However, patient sleeps in recliner) Prior Function Level of Independence: Independent with assistive device(s) Communication Communication: No difficulties    Cognition  Cognition Arousal/Alertness: Awake/alert Behavior During Therapy: Impulsive Overall Cognitive Status: Within Functional Limits for tasks assessed (Baseline - decreased  safety awareness)    Extremity/Trunk Assessment Upper Extremity Assessment Upper  Extremity Assessment: Overall WFL for tasks assessed Lower Extremity Assessment Lower Extremity Assessment: Generalized weakness   Balance    End of Session PT - End of Session Equipment Utilized During Treatment: Gait belt Activity Tolerance: Patient tolerated treatment well Patient left: in bed;with call bell/phone within reach Nurse Communication: Mobility status  GP     [name] 07/16/2013, 6:58 PM [name]. [name], [facility] Acute Rehab Services Pager [phone]

## 2013-07-16 NOTE — H&P (Signed)
Patient Demographics  Alexis [name], is a 80 y.o. female  MRN: [id number]   DOB - 11-09-1931  Admit Date - 07/15/2013  Outpatient Primary MD for the patient is MICHAELS,CHASE A, PA-C   With History of -  Past Medical History  Diagnosis Date  . Diabetes mellitus without complication   . Hypertension   . Neuropathy   . Anxiety   . PONV (postoperative nausea and vomiting)   . CHF (congestive heart failure)       Past Surgical History  Procedure Laterality Date  . Hip fracture surgery    . Abdominal hysterectomy      in for   Chief Complaint  Patient presents with  . Respiratory Distress     HPI  Alexis [name]  is a 80 y.o. female, he presents to ED with complaints of shortness of breath, patient reports symptoms started this afternoon, ED patient was in severe respiratory distress requiring BiPAP, her ABG showing severe respiratory acidosis and hypercapnia, as well patient was noticed to have uncontrolled blood pressure with systolic blood pressure being in the low 200s, patient was started on BiPAP, and on nitro drip, with significant improvement of her symptoms, and blood pressure, currently she is tolerating nasal cannula, and blood pressure is controlled, patient denies any chest pain, any nausea vomiting polyuria dysuria or fever, patient had leukocytosis at 17,000, denies cough productive sputum, urinalysis is not done, chest x-ray does not show any opacity or infiltrate.    Review of Systems    In addition to the HPI above,  No Fever-chills, No Headache, No changes with Vision or hearing, No problems swallowing food or Liquids, No Chest pain, Cough reports Shortness of Breath, No Abdominal pain, No Nausea or Vommitting, Bowel movements are regular, No Blood in stool or Urine, No  dysuria, No new skin rashes or bruises, No new joints pains-aches,  No new weakness, tingling, numbness in any extremity, No recent weight gain or loss, No polyuria, polydypsia or polyphagia, No significant Mental Stressors.  A full 10 point Review of Systems was done, except as stated above, all other Review of Systems were negative.   Social History History  Substance Use Topics  . Smoking status: Never Smoker   . Smokeless tobacco: Never Used  . Alcohol Use: No     Family History History reviewed. No pertinent family history.    Prior to Admission medications   Medication Sig Start Date End Date Taking? Authorizing Provider  albuterol (PROVENTIL HFA;VENTOLIN HFA) 108 (90 BASE) MCG/ACT inhaler Inhale 2 puffs into the lungs every 6 (six) hours as needed for wheezing or shortness of breath. 06/11/13  Yes Nishant Dhungel, MD  amLODipine (NORVASC) 10 MG tablet Take 10 mg by mouth daily. 07/08/13  Yes Chrystie Nose, MD  aspirin EC 81 MG tablet Take 81 mg by mouth at bedtime.   Yes Historical Provider, MD  cholecalciferol (VITAMIN D) 1000 UNITS tablet Take 1,000 Units  by mouth at bedtime.    Yes Historical Provider, MD  escitalopram (LEXAPRO) 10 MG tablet Take 10 mg by mouth daily.   Yes Historical Provider, MD  fenofibrate 160 MG tablet Take 160 mg by mouth daily.   Yes Historical Provider, MD  furosemide (LASIX) 40 MG tablet Take 1 tablet (40 mg total) by mouth daily. 06/11/13  Yes Nishant Dhungel, MD  gabapentin (NEURONTIN) 100 MG capsule Take 100-200 mg by mouth 3 (three) times daily. Take 1 capsule (100 mg) morning and mid day, take 2 capsules (200 mg) at bedtime   Yes Historical Provider, MD  glipiZIDE (GLUCOTROL XL) 10 MG 24 hr tablet Take 10 mg by mouth daily with breakfast.    Yes Historical Provider, MD  metoprolol tartrate (LOPRESSOR) 25 MG tablet Take 12.5 mg by mouth 2 (two) times daily.   Yes Historical Provider, MD  Multiple Vitamin (MULTIVITAMIN WITH MINERALS) TABS  tablet Take 1 tablet by mouth daily.   Yes Historical Provider, MD  sitaGLIPtin (JANUVIA) 50 MG tablet Take 50 mg by mouth at bedtime.   Yes Historical Provider, MD  terbinafine (LAMISIL) 250 MG tablet Take 250 mg by mouth daily.   Yes Historical Provider, MD  glucose blood test strip Use one strip to test glucose three times a day. Dx: 250.00 07/08/13 07/08/14  Historical Provider, MD    Allergies  Allergen Reactions  . Other     novocaine broke mouth out  . Codeine Hives  . Penicillins Hives  . Procaine Hives    Physical Exam  Vitals  Blood pressure 121/48, pulse 61, temperature 98.4 F (36.9 C), temperature source Axillary, resp. rate 24, SpO2 96.00%.   1. General elderly chronically ill-appearing female lying in bed in NAD,    2. Normal affect and insight, Not Suicidal or Homicidal, Awake Alert, Oriented X 3.  3. No F.N deficits, ALL C.Nerves Intact, Strength 5/5 all 4 extremities, Sensation intact all 4 extremities, Plantars down going.  4. Ears and Eyes appear Normal, Conjunctivae clear, PERRLA. Moist Oral Mucosa.  5. Supple Neck, +8 cm JVD, positive hepatojugular reflux, No cervical lymphadenopathy appriciated, No Carotid Bruits.  6. Symmetrical Chest wall movement, Good air movement bilaterally, CTAB.  7. RRR, No Gallops, Rubs or Murmurs, No Parasternal Heave.  8. Positive Bowel Sounds, Abdomen Soft, Non tender, No organomegaly appriciated,No rebound -guarding or rigidity.  9.  No Cyanosis, Normal Skin Turgor, No Skin Rash , has a left lower extremity skin bruise it due to recent fall.  10. Good muscle tone,  joints appear normal , no effusions, Normal ROM.  11. No Palpable Lymph Nodes in Neck or Axillae   Data Review  CBC  Recent Labs Lab 07/15/13 2013  WBC 17.3*  HGB 13.5  HCT 41.3  PLT 305  MCV 88.6  MCH 29.0  MCHC 32.7  RDW 16.1*  LYMPHSABS 2.8  MONOABS 0.7  EOSABS 0.4  BASOSABS 0.1    ------------------------------------------------------------------------------------------------------------------  Chemistries   Recent Labs Lab 07/15/13 2013  NA 142  K 4.1  CL 102  CO2 25  GLUCOSE 269*  BUN 25*  CREATININE 1.29*  CALCIUM 9.3  AST 34  ALT 21  ALKPHOS 92  BILITOT 0.4   ------------------------------------------------------------------------------------------------------------------ CrCl is unknown because both a height and weight (above a minimum accepted value) are required for this calculation. ------------------------------------------------------------------------------------------------------------------ No results found for this basename: TSH, T4TOTAL, FREET3, T3FREE, THYROIDAB,  in the last 72 hours   Coagulation profile No results found for this  basename: INR, PROTIME,  in the last 168 hours ------------------------------------------------------------------------------------------------------------------- No results found for this basename: DDIMER,  in the last 72 hours -------------------------------------------------------------------------------------------------------------------  Cardiac Enzymes No results found for this basename: CK, CKMB, TROPONINI, MYOGLOBIN,  in the last 168 hours ------------------------------------------------------------------------------------------------------------------ No components found with this basename: POCBNP,    ---------------------------------------------------------------------------------------------------------------  Urinalysis    Component Value Date/Time   COLORURINE YELLOW 06/09/2013 1952   APPEARANCEUR CLEAR 06/09/2013 1952   LABSPEC 1.008 06/09/2013 1952   PHURINE 7.0 06/09/2013 1952   GLUCOSEU 100* 06/09/2013 1952   HGBUR NEGATIVE 06/09/2013 1952   BILIRUBINUR NEGATIVE 06/09/2013 1952   KETONESUR NEGATIVE 06/09/2013 1952   PROTEINUR 100* 06/09/2013 1952   UROBILINOGEN 0.2 06/09/2013 1952    NITRITE NEGATIVE 06/09/2013 1952   LEUKOCYTESUR NEGATIVE 06/09/2013 1952    ----------------------------------------------------------------------------------------------------------------  Imaging results:   Dg Chest Port 1 View  07/15/2013   CLINICAL DATA:  Shortness of breath.  Respiratory distress.  EXAM: PORTABLE CHEST - 1 VIEW  COMPARISON:  06/09/2013  FINDINGS: Cardiomegaly. Diffuse bilateral interstitial and alveolar opacities compatible with edema/ CHF. Hyperinflation of the lungs. Suspect small layering effusions.  IMPRESSION: COPD.  Moderate CHF changes.  Small bilateral effusions.   Electronically Signed   By: Charlett Nose M.D.   On: 07/15/2013 22:04    My personal review of EKG: Rhythm NSR, Rate 108 /min, QTc 465 ,    Assessment & Plan  Principal Problem:   Acute on chronic respiratory failure with hypercapnia Active Problems:   Hypertension   Type II or unspecified type diabetes mellitus without mention of complication, uncontrolled   CHF (congestive heart failure)   Acute on chronic respiratory failure    1. acute on chronic hyper Respiratory failure: This is most likely related to flash pulmonary edema from hypertensive urgency, patient currently off BiPAP, will repeat her ABG, currently tolerating nasal cannula, will treat her CHF, as well she appears to be having some mild wheezing, chest x-ray does show evidence of COPD, will start patient on duo nebs every 6 hours and keep her on when necessary albuterol as well. 2. Acute diastolic CHF: Recent echo last month showing 65% ejection fraction, and concentric hypertrophy, patient will be kept on daily weights, strict ins and outs, would have her on IV Lasix. 3. Hypertension: Better controlled now, off nitro drip, resume her home medication, will keep her on when necessary hydralazine 4. Leukocytosis: A febrile. No infiltrate and chest x-ray, will check urinalysis, and if it is negative this is most likely stress induced  from her respiratory distress 5. Diabetes mellitus: Hold glipizide and continue on insulin sliding scale while inpatient.   DVT Prophylaxis Heparin -   AM Labs Ordered, also please review Full Orders  Family Communication: Admission, patients condition and plan of care including tests being ordered have been discussed with the patient and family at bedside who indicate understanding and agree with the plan and Code Status.  Code Status DO NOT RESUSCITATE, confirmed by patient and family at bedside  Likely DC to  home  Condition GUARDED    Time spent in minutes : 60 minutes    Eliazer Hemphill M.D on 07/16/2013 at 12:23 AM    And look for the night coverage person covering me after hours  [facility] Office  [phone]

## 2013-07-16 NOTE — Progress Notes (Signed)
Pt. Refused bipap. Pt. States she does not wear cpap or bipap. RT informed pt. To notify if she changes her mind.

## 2013-07-16 NOTE — ED Notes (Signed)
Per Dr. Randol Kern he would like blood gas to be arterial versus venous.  Respiratory made aware of change.

## 2013-07-16 NOTE — Progress Notes (Signed)
Patient admitted to unit from [facility] ED for further management of shortness of breath.  She arrived via stretcher on 5L O2 via nasal cannula.  Alert and oriented x4, in no apparent distress. On assessment she was weaned down to 2L [place] with sats at 95%.  Placed on telemetry and oriented to room and floor procedures.  Will continue to monitor patient condition. Tijah Hane T

## 2013-07-16 NOTE — Progress Notes (Signed)
Pt. Exhibiting no distress at this time. Pt. States she does not take breathing tx. At home. Pt. Lungs clear at this time. Pt. Scheduled tx. Changed to twice daily and pt. Has a prn tx if needed. Follow up assessment in 72 hours.

## 2013-07-16 NOTE — Care Management Note (Addendum)
    Page 1 of 2   07/19/2013     10:48:29 AM   CARE MANAGEMENT NOTE 07/19/2013  Patient:  Alexis [name], Alexis [name]   Account Number:  [id number]  Date Initiated:  07/16/2013  Documentation initiated by:  [facility]  Subjective/Objective Assessment:   80 y.o. female, he presents to ED with complaints of shortness of breath, patient reports symptoms started this afternoon, ED patient was in severe respiratory distress requiring BiPAP//Home with family     Action/Plan:   BiPAP, IV diureses//Home with HH   Anticipated DC Date:  07/19/2013   Anticipated DC Plan:  HOME W HOME HEALTH SERVICES      DC Planning Services  CM consult      Choice offered to / List presented to:          [facility] arranged  HH-2 PT      [facility] agency  [facility]   Status of service:   Medicare Important Message given?   (If response is "NO", the following Medicare IM given date fields will be blank) Date Medicare IM given:   Date Additional Medicare IM given:    Discharge Disposition:    Per UR Regulation:    If discussed at Long Length of Stay Meetings, dates discussed:   07/19/2013    Comments:  07/19/13 1000 Oletta Cohn, RN, BSN, [employer] [phone] Spoke with pt at bedside regarding discharge planning for [facility]. Offered pt list of home health agencies to choose from.  Pt chose [facility] to render services. Ayesha Rumpf, RN of [place] notified.  No DME needs identified at this time.  07/16/13 1400 Laquinn Shippy, RN, BSN, [place] [phone] NCM will follow and await PT evaluation regarding placement vs HH.

## 2013-07-16 NOTE — Progress Notes (Addendum)
Brief Accept Note:   I have reviewed Dr. Teena Irani H&P and evaluated the patient.  Alexis [name] reports that she is greatly improved after therapy overnight and her breathing is much better, though not back to baseline.  She is on oxygen.  She has been placed on lasix with good results and BP is also better controlled off nitro drip.  She will be continued on current planned therapy and monitored closely for improvement.  Strict I/O, daily weights.  She will need close medical follow up for her chronic issues.  She has had a mild bump in her Cr, likely due to diuresis.  Will recheck in AM.   Debe Coder, MD

## 2013-07-16 NOTE — ED Notes (Signed)
Bed control notified of new bed request.

## 2013-07-17 DIAGNOSIS — J811 Chronic pulmonary edema: Secondary | ICD-10-CM

## 2013-07-17 LAB — GLUCOSE, CAPILLARY
GLUCOSE-CAPILLARY: 79 mg/dL (ref 70–99)
GLUCOSE-CAPILLARY: 125 mg/dL — AB (ref 70–99)
GLUCOSE-CAPILLARY: 181 mg/dL — AB (ref 70–99)
Glucose-Capillary: 175 mg/dL — ABNORMAL HIGH (ref 70–99)

## 2013-07-17 LAB — BASIC METABOLIC PANEL
BUN: 37 mg/dL — ABNORMAL HIGH (ref 6–23)
CHLORIDE: 100 meq/L (ref 96–112)
CO2: 30 meq/L (ref 19–32)
CREATININE: 1.65 mg/dL — AB (ref 0.50–1.10)
Calcium: 9.2 mg/dL (ref 8.4–10.5)
GFR calc Af Amer: 33 mL/min — ABNORMAL LOW (ref >90)
GFR calc non Af Amer: 28 mL/min — ABNORMAL LOW (ref >90)
Glucose, Bld: 88 mg/dL (ref 70–99)
Potassium: 3.7 mEq/L (ref 3.7–5.3)
Sodium: 143 mEq/L (ref 137–147)

## 2013-07-17 MED ORDER — FUROSEMIDE 10 MG/ML IJ SOLN
40.0000 mg | Freq: Every day | INTRAMUSCULAR | Status: DC
  Administered 2013-07-17: 40 mg via INTRAVENOUS
  Filled 2013-07-17: qty 4

## 2013-07-17 NOTE — Progress Notes (Signed)
Physical Therapy Treatment Patient Details Name: Alexis [name] MRN: [id number] DOB: May 21, 1932 Today's Date: 07/17/2013 Time: 0240-9735 PT Time Calculation (min): 24 min  PT Assessment / Plan / Recommendation  History of Present Illness 80 yo female admitted with resp failure, HTN urgency, COPD, CHF. Hx of DM, neuropathy, HTN, anxiety.    PT Comments   Patient able to ambulate safely with use of RW.  Encouraged her to use RW indoors and especially outdoors for safety.  Continue to recommend HHPT for balance/mobility training.  Follow Up Recommendations  Home health PT;Supervision - Intermittent     Does the patient have the potential to tolerate intense rehabilitation     Barriers to Discharge        Equipment Recommendations  3in1 (PT) (shower seat)    Recommendations for Other Services    Frequency Min 3X/week   Progress towards PT Goals Progress towards PT goals: Progressing toward goals  Plan Current plan remains appropriate    Precautions / Restrictions Precautions Precautions: Fall Precaution Comments: Patient has had 5 falls in last 2 months, all outside without her RW.  Educated patient to ALWAYS use her RW outside for safety. Restrictions Weight Bearing Restrictions: No   Pertinent Vitals/Pain O2 sats at  94% with ambulation on room air.  Patient reports no dyspnea. O2 sats at 97% on room air with rest following ambulation. RN notified of O2 sat readings.    Mobility  Transfers Overall transfer level: Modified independent Equipment used: Rolling walker (2 wheeled) General transfer comment: No cues or assist needed. Ambulation/Gait Ambulation/Gait assistance: Supervision Ambulation Distance (Feet): 220 Feet Assistive device: Rolling walker (2 wheeled) Gait Pattern/deviations: Step-through pattern;Decreased stride length Gait velocity: Slow gait speed Gait velocity interpretation: Below normal speed for age/gender General Gait Details: Patient with good  balance with RW.  Encouraged patient to look for obstacles in environment - walks too close to objects causing risk of fall.  Reinforced need to use RW especially outdoors.      PT Goals (current goals can now be found in the care plan section)    Visit Information  Last PT Received On: 07/17/13 Assistance Needed: +1 History of Present Illness: 80 yo female admitted with resp failure, HTN urgency, COPD, CHF. Hx of DM, neuropathy, HTN, anxiety.     Subjective Data  Subjective: "I've got to move around and sit in the chair today"   Cognition  Cognition Arousal/Alertness: Awake/alert Behavior During Therapy: Impulsive Overall Cognitive Status: Within Functional Limits for tasks assessed (Baseline - decreased safety awareness)    Balance     End of Session PT - End of Session Equipment Utilized During Treatment: Gait belt Activity Tolerance: Patient tolerated treatment well Patient left: in chair;with call bell/phone within reach Nurse Communication: Mobility status (O2 sat readings on room air; Request for bath)   GP     Alexis [name] 07/17/2013, 9:14 AM Alexis [name]. [name], [facility] Acute Rehab Services Pager [phone]

## 2013-07-17 NOTE — Progress Notes (Signed)
[facility] PROGRESS NOTE Vanden Fawaz L. Link Snuffer, MD               Pager [phone] (if 7P to 7A, page night hospitalist on [email or web address])  Alexis [name] JHE:[id number] DOB: 1932-02-08 DOA: 07/15/2013 PCP: Marshia Ly, PA-C  Assessment/Plan: 1. Acute on chronic hypercarbic resp failure 2/2 flash pulm edema in setting of HTN urgency - diuresed well w/ lasix 40mg  IV BID, deesc to 40 IV daily today given bump in BUN/Cr. Likely resume home lasix tomorrow w/ prob dc monday. On [place] (home dose of 2L)  2. Acute diastolic CHF - CXR w/ effusions and recent TTE w/ normal systolic fxn. Lasix as above  3. HTN - stabilized. Resume home meds and hydralazine prn  4. Leukocytosis - resolved w/o abx. Monitor  5. DM - SSI   She needs to ambulate today.   Principal Problem:   Acute on chronic respiratory failure with hypercapnia Active Problems:   Hypertension   Type II or unspecified type diabetes mellitus without mention of complication, uncontrolled   CHF (congestive heart failure)   Acute on chronic respiratory failure   Code Status: full Family Communication: none  Disposition Plan: home likely Monday after diuresis   Alysia Penna, MD  Internal Medicine Pager [phone].  If 7PM-7AM, please contact night-coverage at [email or web address], password [facility] 07/17/2013, 7:22 AM   LOS: 2 days   Brief narrative: 80y female presents w/ hypercarbic resp failure initially requiring BiPAP and CXR showing CHF signs . Initially had leukocytosis that resolved   Consultants:  None   Procedures:  None   Antibiotics:  None  ?  HPI/Subjective: No events overnight. Back to home O2.   Objective: Filed Vitals:   07/16/13 1947 07/16/13 2137 07/17/13 0248 07/17/13 0509  BP:  126/41 132/68 142/50  Pulse:  61 72 62  Temp:  98.4 F (36.9 C) 98.7 F (37.1 C) 97.9 F (36.6 C)  TempSrc:  Oral Oral Oral  Resp:  20 18 18   Height:      Weight:    65.772 kg (145 lb)  SpO2: 98% 97% 100% 96%     Intake/Output Summary (Last 24 hours) at 07/17/13 9702 Last data filed at 07/17/13 6378  Gross per 24 hour  Intake    720 ml  Output   2450 ml  Net  -1730 ml    Exam:   General:  WF in NAD   HEENT : MMM, anicteric  Lungs: Crackles at bases , good airmovement   Cards: RRR, no MRG   Abd: soft, NT , ND   Ext: no edema or cyanosis   Data Reviewed: Basic Metabolic Panel:  Recent Labs Lab 07/15/13 2013 07/16/13 0438 07/17/13 0457  NA 142 141 143  K 4.1 3.9 3.7  CL 102 104 100  CO2 25 26 30   GLUCOSE 269* 167* 88  BUN 25* 28* 37*  CREATININE 1.29* 1.52* 1.65*  CALCIUM 9.3 8.8 9.2   Liver Function Tests:  Recent Labs Lab 07/15/13 2013  AST 34  ALT 21  ALKPHOS 92  BILITOT 0.4  PROT 7.5  ALBUMIN 3.6   No results found for this basename: LIPASE, AMYLASE,  in the last 168 hours No results found for this basename: AMMONIA,  in the last 168 hours CBC:  Recent Labs Lab 07/15/13 2013 07/16/13 0438  WBC 17.3* 9.5  NEUTROABS 13.3*  --   HGB 13.5 10.8*  HCT 41.3 32.8*  MCV 88.6 87.5  PLT 305 204  Cardiac Enzymes: No results found for this basename: CKTOTAL, CKMB, CKMBINDEX, TROPONINI,  in the last 168 hours BNP: No components found with this basename: POCBNP,  CBG:  Recent Labs Lab 07/16/13 0503 07/16/13 1151 07/16/13 1720 07/16/13 2223 07/17/13 0613  GLUCAP 161* 120* 97 113* 79    No results found for this or any previous visit (from the past 240 hour(s)).   Studies: Dg Chest Port 1 View  07/15/2013   CLINICAL DATA:  Shortness of breath.  Respiratory distress.  EXAM: PORTABLE CHEST - 1 VIEW  COMPARISON:  06/09/2013  FINDINGS: Cardiomegaly. Diffuse bilateral interstitial and alveolar opacities compatible with edema/ CHF. Hyperinflation of the lungs. Suspect small layering effusions.  IMPRESSION: COPD.  Moderate CHF changes.  Small bilateral effusions.   Electronically Signed   By: Charlett NoseKevin  Dover M.D.   On: 07/15/2013 22:04    Scheduled  Meds: . albuterol  2.5 mg Nebulization BID  . amLODipine  10 mg Oral Daily  . antiseptic oral rinse  15 mL Mouth Rinse BID  . aspirin EC  81 mg Oral QHS  . cholecalciferol  1,000 Units Oral QHS  . docusate sodium  100 mg Oral BID  . escitalopram  10 mg Oral Daily  . fenofibrate  160 mg Oral Daily  . furosemide  40 mg Intravenous Daily  . gabapentin  100 mg Oral 2 times per day  . gabapentin  200 mg Oral QHS  . heparin  5,000 Units Subcutaneous 3 times per day  . insulin aspart  0-9 Units Subcutaneous TID WC  . ipratropium  0.5 mg Nebulization BID  . metoprolol tartrate  12.5 mg Oral BID  . multivitamin with minerals  1 tablet Oral Daily  . senna  1 tablet Oral BID   Continuous Infusions:    Time Spent: 30 min

## 2013-07-18 DIAGNOSIS — N179 Acute kidney failure, unspecified: Secondary | ICD-10-CM

## 2013-07-18 LAB — CBC
HEMATOCRIT: 33.1 % — AB (ref 36.0–46.0)
Hemoglobin: 10.7 g/dL — ABNORMAL LOW (ref 12.0–15.0)
MCH: 28.1 pg (ref 26.0–34.0)
MCHC: 32.3 g/dL (ref 30.0–36.0)
MCV: 86.9 fL (ref 78.0–100.0)
Platelets: 232 10*3/uL (ref 150–400)
RBC: 3.81 MIL/uL — ABNORMAL LOW (ref 3.87–5.11)
RDW: 16 % — AB (ref 11.5–15.5)
WBC: 5.7 10*3/uL (ref 4.0–10.5)

## 2013-07-18 LAB — BASIC METABOLIC PANEL
BUN: 44 mg/dL — AB (ref 6–23)
CALCIUM: 9.5 mg/dL (ref 8.4–10.5)
CO2: 30 mEq/L (ref 19–32)
Chloride: 99 mEq/L (ref 96–112)
Creatinine, Ser: 1.82 mg/dL — ABNORMAL HIGH (ref 0.50–1.10)
GFR calc Af Amer: 29 mL/min — ABNORMAL LOW (ref >90)
GFR, EST NON AFRICAN AMERICAN: 25 mL/min — AB (ref >90)
Glucose, Bld: 114 mg/dL — ABNORMAL HIGH (ref 70–99)
Potassium: 3.5 mEq/L — ABNORMAL LOW (ref 3.7–5.3)
SODIUM: 140 meq/L (ref 137–147)

## 2013-07-18 LAB — GLUCOSE, CAPILLARY
GLUCOSE-CAPILLARY: 116 mg/dL — AB (ref 70–99)
GLUCOSE-CAPILLARY: 178 mg/dL — AB (ref 70–99)
GLUCOSE-CAPILLARY: 213 mg/dL — AB (ref 70–99)
GLUCOSE-CAPILLARY: 234 mg/dL — AB (ref 70–99)
Glucose-Capillary: 188 mg/dL — ABNORMAL HIGH (ref 70–99)

## 2013-07-18 MED ORDER — FUROSEMIDE 40 MG PO TABS
40.0000 mg | ORAL_TABLET | Freq: Every day | ORAL | Status: DC
  Filled 2013-07-18: qty 1

## 2013-07-18 MED ORDER — IPRATROPIUM-ALBUTEROL 0.5-2.5 (3) MG/3ML IN SOLN: 3.0000 mL | RESPIRATORY_TRACT | Status: DC | PRN

## 2013-07-18 MED ORDER — IPRATROPIUM-ALBUTEROL 0.5-2.5 (3) MG/3ML IN SOLN
3.0000 mL | Freq: Two times a day (BID) | RESPIRATORY_TRACT | Status: DC
  Administered 2013-07-18 (×2): 3 mL via RESPIRATORY_TRACT
  Filled 2013-07-18 (×2): qty 3

## 2013-07-18 NOTE — Progress Notes (Signed)
[facility] PROGRESS NOTE Brent Noto L. Link Snuffer, MD               Pager [phone] (if 7P to 7A, page night hospitalist on [email or web address])  DAVYNE KHONG AQT:[id number] DOB: March 20, 1932 DOA: 07/15/2013 PCP: Marshia Ly, PA-C  Assessment/Plan: 1. Acute on chronic hypercarbic resp failure 2/2 flash pulm edema in setting of HTN urgency - back on home O2. Holding diuretic today 2/2 renal decline. Consider d/c home tomorrow on home lasix 40mg  QD if Cr stabilizes  2. Acute diastolic CHF - net neg 3L. GFR declining. Resolved.  3. HTN urgency - resolved. stable on home meds  4. Leukocytosis - resolved w/o abx. Monitor  5. DM - SSI  6. AKI - Likely 2/2 overaggressive diuresis. Holding lasix today. Recheck in AM. If Cr plateaus, safe for d/c w/ home lasix resumed   She needs to ambulate today.   Principal Problem:   Acute on chronic respiratory failure with hypercapnia Active Problems:   Hypertension   Type II or unspecified type diabetes mellitus without mention of complication, uncontrolled   CHF (congestive heart failure)   Acute on chronic respiratory failure   Code Status: full Family Communication: none  Disposition Plan: home likely Monday if Cr Plateaus   Alysia Penna, MD  Internal Medicine Pager [phone].  If 7PM-7AM, please contact night-coverage at [email or web address], password [facility] 07/18/2013, 9:47 AM   LOS: 3 days   Brief narrative: 4y female presents w/ hypercarbic resp failure initially requiring BiPAP and CXR showing CHF signs . Initially had leukocytosis that resolved   Consultants:  None   Procedures:  None   Antibiotics:  None  ?  HPI/Subjective: No events overnight. on home O2.   Objective: Filed Vitals:   07/17/13 2038 07/17/13 2100 07/18/13 0533 07/18/13 0903  BP:  154/49 137/48   Pulse:  88 60   Temp:  97.9 F (36.6 C) 97.8 F (36.6 C)   TempSrc:  Oral Oral   Resp:  22 20   Height:      Weight:   64.411 kg (142 lb)   SpO2: 94% 94% 96%  98%    Intake/Output Summary (Last 24 hours) at 07/18/13 0947 Last data filed at 07/18/13 0831  Gross per 24 hour  Intake   1182 ml  Output   2450 ml  Net  -1268 ml    Exam:   General:  WF in NAD   HEENT : MMM, anicteric  Lungs: Crackles at bases , good airmovement   Cards: RRR, no MRG   Abd: soft, NT , ND   Ext: no edema or cyanosis   Data Reviewed: Basic Metabolic Panel:  Recent Labs Lab 07/15/13 2013 07/16/13 0438 07/17/13 0457 07/18/13 0555  NA 142 141 143 140  K 4.1 3.9 3.7 3.5*  CL 102 104 100 99  CO2 25 26 30 30   GLUCOSE 269* 167* 88 114*  BUN 25* 28* 37* 44*  CREATININE 1.29* 1.52* 1.65* 1.82*  CALCIUM 9.3 8.8 9.2 9.5   Liver Function Tests:  Recent Labs Lab 07/15/13 2013  AST 34  ALT 21  ALKPHOS 92  BILITOT 0.4  PROT 7.5  ALBUMIN 3.6   No results found for this basename: LIPASE, AMYLASE,  in the last 168 hours No results found for this basename: AMMONIA,  in the last 168 hours CBC:  Recent Labs Lab 07/15/13 2013 07/16/13 0438 07/18/13 0555  WBC 17.3* 9.5 5.7  NEUTROABS 13.3*  --   --  HGB 13.5 10.8* 10.7*  HCT 41.3 32.8* 33.1*  MCV 88.6 87.5 86.9  PLT 305 204 232   Cardiac Enzymes: No results found for this basename: CKTOTAL, CKMB, CKMBINDEX, TROPONINI,  in the last 168 hours BNP: No components found with this basename: POCBNP,  CBG:  Recent Labs Lab 07/17/13 0613 07/17/13 1119 07/17/13 1629 07/17/13 2130 07/18/13 0616  GLUCAP 79 125* 181* 175* 116*    No results found for this or any previous visit (from the past 240 hour(s)).   Studies: Dg Chest Port 1 View  07/15/2013   CLINICAL DATA:  Shortness of breath.  Respiratory distress.  EXAM: PORTABLE CHEST - 1 VIEW  COMPARISON:  06/09/2013  FINDINGS: Cardiomegaly. Diffuse bilateral interstitial and alveolar opacities compatible with edema/ CHF. Hyperinflation of the lungs. Suspect small layering effusions.  IMPRESSION: COPD.  Moderate CHF changes.  Small bilateral  effusions.   Electronically Signed   By: Charlett NoseKevin  Dover M.D.   On: 07/15/2013 22:04    Scheduled Meds: . amLODipine  10 mg Oral Daily  . antiseptic oral rinse  15 mL Mouth Rinse BID  . aspirin EC  81 mg Oral QHS  . cholecalciferol  1,000 Units Oral QHS  . docusate sodium  100 mg Oral BID  . escitalopram  10 mg Oral Daily  . fenofibrate  160 mg Oral Daily  . gabapentin  100 mg Oral 2 times per day  . gabapentin  200 mg Oral QHS  . heparin  5,000 Units Subcutaneous 3 times per day  . insulin aspart  0-9 Units Subcutaneous TID WC  . ipratropium-albuterol  3 mL Nebulization BID  . metoprolol tartrate  12.5 mg Oral BID  . multivitamin with minerals  1 tablet Oral Daily  . senna  1 tablet Oral BID   Continuous Infusions:    Time Spent: 30 min

## 2013-07-19 LAB — CBC
HCT: 33.4 % — ABNORMAL LOW (ref 36.0–46.0)
HEMOGLOBIN: 10.9 g/dL — AB (ref 12.0–15.0)
MCH: 28.2 pg (ref 26.0–34.0)
MCHC: 32.6 g/dL (ref 30.0–36.0)
MCV: 86.5 fL (ref 78.0–100.0)
Platelets: 233 10*3/uL (ref 150–400)
RBC: 3.86 MIL/uL — AB (ref 3.87–5.11)
RDW: 16 % — ABNORMAL HIGH (ref 11.5–15.5)
WBC: 4.8 10*3/uL (ref 4.0–10.5)

## 2013-07-19 LAB — BASIC METABOLIC PANEL
BUN: 41 mg/dL — ABNORMAL HIGH (ref 6–23)
CO2: 27 meq/L (ref 19–32)
Calcium: 9.3 mg/dL (ref 8.4–10.5)
Chloride: 103 mEq/L (ref 96–112)
Creatinine, Ser: 1.52 mg/dL — ABNORMAL HIGH (ref 0.50–1.10)
GFR calc Af Amer: 36 mL/min — ABNORMAL LOW (ref >90)
GFR calc non Af Amer: 31 mL/min — ABNORMAL LOW (ref >90)
GLUCOSE: 111 mg/dL — AB (ref 70–99)
Potassium: 3.7 mEq/L (ref 3.7–5.3)
SODIUM: 143 meq/L (ref 137–147)

## 2013-07-19 LAB — GLUCOSE, CAPILLARY
Glucose-Capillary: 108 mg/dL — ABNORMAL HIGH (ref 70–99)
Glucose-Capillary: 162 mg/dL — ABNORMAL HIGH (ref 70–99)

## 2013-07-19 MED ORDER — FUROSEMIDE 40 MG PO TABS: 40.0000 mg | ORAL_TABLET | Freq: Every day | ORAL | Status: DC

## 2013-07-19 NOTE — Progress Notes (Signed)
Medicare Important Message given. Mattie Novosel J. Eulice Rutledge, RN, BSN, NCM [phone].   

## 2013-07-19 NOTE — Progress Notes (Signed)
Physical Therapy Treatment Patient Details Name: Alexis [name] MRN: [id number] DOB: 08-17-1931 Today's Date: 07/19/2013 Time: 1151-1206 PT Time Calculation (min): 15 min  PT Assessment / Plan / Recommendation  History of Present Illness 81 yo female admitted with resp failure, HTN urgency, COPD, CHF. Hx of DM, neuropathy, HTN, anxiety.    PT Comments   Patient doing well with ambulation with use of RW.  Patient scored 14/21 on modified DGI balance scale due to use of RW.  Would indicate fall risk.  Continue to recommend HHPT for mobility and balance training.  Follow Up Recommendations  Home health PT;Supervision - Intermittent     Does the patient have the potential to tolerate intense rehabilitation     Barriers to Discharge        Equipment Recommendations  3in1 (PT) (shower seat)    Recommendations for Other Services    Frequency Min 3X/week   Progress towards PT Goals Progress towards PT goals: Progressing toward goals  Plan Current plan remains appropriate    Precautions / Restrictions Precautions Precautions: Fall Restrictions Weight Bearing Restrictions: No   Pertinent Vitals/Pain     Mobility  Bed Mobility Overal bed mobility: Independent General bed mobility comments: Patient moves supine <> sit without assist Transfers Overall transfer level: Modified independent Equipment used: Rolling walker (2 wheeled) General transfer comment: No cues or assist needed. Ambulation/Gait Ambulation/Gait assistance: Modified independent (Device/Increase time) Ambulation Distance (Feet): 220 Feet Assistive device: Rolling walker (2 wheeled) Gait Pattern/deviations: WFL(Within Functional Limits) Gait velocity: WFL Gait velocity interpretation: at or above normal speed for age/gender General Gait Details: Patient with good balance with RW.  Encouraged patient to look for obstacles in environment - walks too close to objects causing risk of fall.  Reinforced need to use  RW especially outdoors.      PT Goals (current goals can now be found in the care plan section)    Visit Information  Last PT Received On: 07/19/13 Assistance Needed: +1 History of Present Illness: 80 yo female admitted with resp failure, HTN urgency, COPD, CHF. Hx of DM, neuropathy, HTN, anxiety.     Subjective Data  Subjective: "I'm going home today"   Cognition  Cognition Arousal/Alertness: Awake/alert Behavior During Therapy: Impulsive Overall Cognitive Status: Within Functional Limits for tasks assessed (Baseline - decreased safety awareness)    Balance  Balance Overall balance assessment: Modified Independent High level balance activites: Direction changes;Turns;Sudden stops;Head turns (Walking around obstacles; Stepping over obstacles) High Level Balance Comments: Patient able to maintain balance with high level activities with use of RW Standardized Balance Assessment Standardized Balance Assessment : Dynamic Gait Index Dynamic Gait Index Level Surface: Mild Impairment Change in Gait Speed: Mild Impairment Gait with Horizontal Head Turns: Mild Impairment Gait with Vertical Head Turns: Mild Impairment Gait and Pivot Turn: Mild Impairment Step Over Obstacle: Mild Impairment Step Around Obstacles: Mild Impairment  End of Session PT - End of Session Equipment Utilized During Treatment: Gait belt Activity Tolerance: Patient tolerated treatment well Patient left: in bed;with call bell/phone within reach (EOB) Nurse Communication: Mobility status   GP     [name] 07/19/2013, 1:07 PM [name]. [name], [facility] Acute Rehab Services Pager [phone]

## 2013-07-19 NOTE — Progress Notes (Signed)
Pt being dc to home with home health, pt given dc instructions, pt verbalized understanding, pt stable

## 2013-07-19 NOTE — Discharge Summary (Addendum)
Physician Discharge Summary  Alexis GivensChristine E [name] ZOX:[id number] DOB: 09/28/1931 DOA: 07/15/2013  PCP: Marshia LyMICHAELS,CHASE A, PA-C  Admit date: 07/15/2013 Discharge date: 07/19/2013  Time spent: 50* minutes  Recommendations for Outpatient Follow-up:  Follow up PCP in 2 weeks and check BMP to monitor the renal functions  Discharge Diagnoses:  Principal Problem:   Acute on chronic respiratory failure with hypercapnia Active Problems:   Hypertension   Type II or unspecified type diabetes mellitus without mention of complication, uncontrolled   CHF (congestive heart failure)   Acute on chronic respiratory failure   Discharge Condition: Stable  Diet recommendation: Low salt diet  Filed Weights   07/17/13 0509 07/18/13 0533 07/19/13 0452  Weight: 65.772 kg (145 lb) 64.411 kg (142 lb) 64.683 kg (142 lb 9.6 oz)    History of present illness:  80 y.o. female, he presents to ED with complaints of shortness of breath, patient reports symptoms started this afternoon, ED patient was in severe respiratory distress requiring BiPAP, her ABG showing severe respiratory acidosis and hypercapnia, as well patient was noticed to have uncontrolled blood pressure with systolic blood pressure being in the low 200s, patient was started on BiPAP, and on nitro drip, with significant improvement of her symptoms, and blood pressure, currently she is tolerating nasal cannula, and blood pressure is controlled, patient denies any chest pain, any nausea vomiting polyuria dysuria or fever, patient had leukocytosis at 17,000, denies cough productive sputum, urinalysis is not done, chest x-ray does not show any opacity or infiltrate   Hospital Course:  1. Acute on chronic hypercarbic resp failure 2/2 flash pulm edema in setting of HTN urgency - diuresed well w/ lasix 40mg  IV BID, deesc to 40 IV daily today given bump in BUN/Cr. Will  resume home lasix 40 mg po daily. On [place] (home dose of 2L)  2. Acute diastolic CHF -  resolved, now breathing much better.CXR w/ effusions and recent TTE w/ normal systolic fxn. Lasix as above  3. HTN - stabilized. Resume home meds. 4. Leukocytosis - resolved w/o abx. Likely reactive, she has been afebrile in the hospital. 5. DM - Resume the home meds 6. Acute on CKD- patient's creatinine was elevated after she received lasix.IV lasix was held and her cr has come back to her baseline.   Procedures:  None  Consultations:  None  Discharge Exam: Filed Vitals:   07/19/13 0555  BP: 128/48  Pulse: 55  Temp: 97.9 F (36.6 C)  Resp: 17    General: Appear in no acute distress Cardiovascular: S1s2 RRR Respiratory: Clear bilaterally Ext - No edema  Discharge Instructions  Discharge Orders   Future Appointments Provider Department Dept Phone   07/21/2013 1:15 PM [facility] [address] [phone]   07/27/2013 2:30 PM Chrystie NoseKenneth [name], MD [facility] [phone]   Future Orders Complete By Expires   Diet - low sodium heart healthy  As directed    Discharge instructions  As directed    Comments:     Home PT ordered   Increase activity slowly  As directed        Medication List         albuterol 108 (90 BASE) MCG/ACT inhaler  Commonly known as:  PROVENTIL HFA;VENTOLIN HFA  Inhale 2 puffs into the lungs every 6 (six) hours as needed for wheezing or shortness of breath.     amLODipine 10 MG tablet  Commonly known as:  NORVASC  Take 10 mg by  mouth daily.     aspirin EC 81 MG tablet  Take 81 mg by mouth at bedtime.     cholecalciferol 1000 UNITS tablet  Commonly known as:  VITAMIN D  Take 1,000 Units by mouth at bedtime.     escitalopram 10 MG tablet  Commonly known as:  LEXAPRO  Take 10 mg by mouth daily.     fenofibrate 160 MG tablet  Take 160 mg by mouth daily.     furosemide 40 MG tablet  Commonly known as:  LASIX  Take 1 tablet (40 mg total) by mouth daily.     gabapentin 100 MG capsule   Commonly known as:  NEURONTIN  Take 100-200 mg by mouth 3 (three) times daily. Take 1 capsule (100 mg) morning and mid day, take 2 capsules (200 mg) at bedtime     glipiZIDE 10 MG 24 hr tablet  Commonly known as:  GLUCOTROL XL  Take 10 mg by mouth daily with breakfast.     metoprolol tartrate 25 MG tablet  Commonly known as:  LOPRESSOR  Take 12.5 mg by mouth 2 (two) times daily.     multivitamin with minerals Tabs tablet  Take 1 tablet by mouth daily.     sitaGLIPtin 50 MG tablet  Commonly known as:  JANUVIA  Take 50 mg by mouth at bedtime.     terbinafine 250 MG tablet  Commonly known as:  LAMISIL  Take 250 mg by mouth daily.       Allergies  Allergen Reactions  . Other     novocaine broke mouth out  . Codeine Hives  . Penicillins Hives  . Procaine Hives      The results of significant diagnostics from this hospitalization (including imaging, microbiology, ancillary and laboratory) are listed below for reference.    Significant Diagnostic Studies: Dg Chest Port 1 View  07/15/2013   CLINICAL DATA:  Shortness of breath.  Respiratory distress.  EXAM: PORTABLE CHEST - 1 VIEW  COMPARISON:  06/09/2013  FINDINGS: Cardiomegaly. Diffuse bilateral interstitial and alveolar opacities compatible with edema/ CHF. Hyperinflation of the lungs. Suspect small layering effusions.  IMPRESSION: COPD.  Moderate CHF changes.  Small bilateral effusions.   Electronically Signed   By: Charlett Nose M.D.   On: 07/15/2013 22:04    Microbiology: No results found for this or any previous visit (from the past 240 hour(s)).   Labs: Basic Metabolic Panel:  Recent Labs Lab 07/15/13 2013 07/16/13 0438 07/17/13 0457 07/18/13 0555 07/19/13 0540  NA 142 141 143 140 143  K 4.1 3.9 3.7 3.5* 3.7  CL 102 104 100 99 103  CO2 25 26 30 30 27   GLUCOSE 269* 167* 88 114* 111*  BUN 25* 28* 37* 44* 41*  CREATININE 1.29* 1.52* 1.65* 1.82* 1.52*  CALCIUM 9.3 8.8 9.2 9.5 9.3   Liver Function  Tests:  Recent Labs Lab 07/15/13 2013  AST 34  ALT 21  ALKPHOS 92  BILITOT 0.4  PROT 7.5  ALBUMIN 3.6   No results found for this basename: LIPASE, AMYLASE,  in the last 168 hours No results found for this basename: AMMONIA,  in the last 168 hours CBC:  Recent Labs Lab 07/15/13 2013 07/16/13 0438 07/18/13 0555 07/19/13 0540  WBC 17.3* 9.5 5.7 4.8  NEUTROABS 13.3*  --   --   --   HGB 13.5 10.8* 10.7* 10.9*  HCT 41.3 32.8* 33.1* 33.4*  MCV 88.6 87.5 86.9 86.5  PLT 305 204 232  233   Cardiac Enzymes: No results found for this basename: CKTOTAL, CKMB, CKMBINDEX, TROPONINI,  in the last 168 hours BNP: BNP (last 3 results)  Recent Labs  06/09/13 1645 06/10/13 0231 07/15/13 2013  PROBNP 1483.0* 2374.0* 1598.0*   CBG:  Recent Labs Lab 07/18/13 1135 07/18/13 1629 07/18/13 2100 07/18/13 2101 07/19/13 0635  GLUCAP 178* 188* 234* 213* 108*       Signed:  Naren Benally S  [facility] 07/19/2013, 9:41 AM

## 2013-07-19 NOTE — Progress Notes (Signed)
i co-sign Cline Crock assessments

## 2013-07-21 ENCOUNTER — Ambulatory Visit (HOSPITAL_COMMUNITY)
Admission: RE | Admit: 2013-07-21 | Discharge: 2013-07-21 | Disposition: A | Discharge status: Home / Self Care | Payer: Medicare Other | Source: Ambulatory Visit | Attending: Internal Medicine | Admitting: Internal Medicine

## 2013-07-21 DIAGNOSIS — E785 Hyperlipidemia, unspecified: Secondary | ICD-10-CM

## 2013-07-21 DIAGNOSIS — E663 Overweight: Secondary | ICD-10-CM | POA: Insufficient documentation

## 2013-07-21 DIAGNOSIS — E119 Type 2 diabetes mellitus without complications: Secondary | ICD-10-CM | POA: Insufficient documentation

## 2013-07-21 DIAGNOSIS — R5383 Other fatigue: Secondary | ICD-10-CM

## 2013-07-21 DIAGNOSIS — I509 Heart failure, unspecified: Secondary | ICD-10-CM | POA: Insufficient documentation

## 2013-07-21 DIAGNOSIS — Z8249 Family history of ischemic heart disease and other diseases of the circulatory system: Secondary | ICD-10-CM | POA: Insufficient documentation

## 2013-07-21 DIAGNOSIS — J4489 Other specified chronic obstructive pulmonary disease: Secondary | ICD-10-CM | POA: Insufficient documentation

## 2013-07-21 DIAGNOSIS — I2510 Atherosclerotic heart disease of native coronary artery without angina pectoris: Secondary | ICD-10-CM | POA: Insufficient documentation

## 2013-07-21 DIAGNOSIS — I428 Other cardiomyopathies: Secondary | ICD-10-CM | POA: Insufficient documentation

## 2013-07-21 DIAGNOSIS — Z6826 Body mass index (BMI) 26.0-26.9, adult: Secondary | ICD-10-CM | POA: Insufficient documentation

## 2013-07-21 DIAGNOSIS — J449 Chronic obstructive pulmonary disease, unspecified: Secondary | ICD-10-CM | POA: Insufficient documentation

## 2013-07-21 DIAGNOSIS — I5030 Unspecified diastolic (congestive) heart failure: Secondary | ICD-10-CM

## 2013-07-21 DIAGNOSIS — I10 Essential (primary) hypertension: Secondary | ICD-10-CM | POA: Insufficient documentation

## 2013-07-21 DIAGNOSIS — R5381 Other malaise: Secondary | ICD-10-CM | POA: Insufficient documentation

## 2013-07-21 MED ORDER — REGADENOSON 0.4 MG/5ML IV SOLN
0.4000 mg | Freq: Once | INTRAVENOUS | Status: AC
  Administered 2013-07-21: 0.4 mg via INTRAVENOUS

## 2013-07-21 MED ORDER — TECHNETIUM TC 99M SESTAMIBI GENERIC - CARDIOLITE
30.2000 | Freq: Once | INTRAVENOUS | Status: AC | PRN
  Administered 2013-07-21: 30.2 via INTRAVENOUS

## 2013-07-21 MED ORDER — AMINOPHYLLINE 25 MG/ML IV SOLN
75.0000 mg | Freq: Once | INTRAVENOUS | Status: AC
  Administered 2013-07-21: 75 mg via INTRAVENOUS

## 2013-07-21 MED ORDER — TECHNETIUM TC 99M SESTAMIBI GENERIC - CARDIOLITE
10.3000 | Freq: Once | INTRAVENOUS | Status: AC | PRN
  Administered 2013-07-21: 10 via INTRAVENOUS

## 2013-07-21 NOTE — Procedures (Addendum)
[facility] [address] [place] [address] [phone]  Cardiology Nuclear Med Study  Alexis [name] is a 80 y.o. female     MRN : [id number]     DOB: 01/29/1932  Procedure Date: 07/21/2013  Nuclear Med Background Indication for Stress Test:  Evaluation for Ischemia and Post Hospital History:  Asthma, COPD and CHF;hypertrophic cardiomyopathy Cardiac Risk Factors: Family History - CAD, Hypertension, Lipids, NIDDM and Overweight  Symptoms:  Fatigue and SOB   Nuclear Pre-Procedure Caffeine/Decaff Intake:  10:00pm NPO After: 8:00am   IV Site: R Hand  IV 0.9% NS with Angio Cath:  22g  Chest Size (in):  n/a IV Started by: Emmit Pomfret, RN  Height: 5\' 2"  (1.575 m)  Cup Size: B  BMI:  Body mass index is 26.88 kg/(m^2). Weight:  147 lb (66.679 kg)   Tech Comments:  n/a    Nuclear Med Study 1 or 2 day study: 1 day  Stress Test Type:  Lexiscan  Order Authorizing Provider:  Zoila Shutter, MD   Resting Radionuclide: Technetium 6m Sestamibi  Resting Radionuclide Dose: 10.3 mCi   Stress Radionuclide:  Technetium 46m Sestamibi  Stress Radionuclide Dose: 30.2 mCi           Stress Protocol Rest HR: 58 Stress HR: 69  Rest BP: 148/65 Stress BP: 144/54  Exercise Time (min): n/a METS: n/a   Predicted Max HR: 141 bpm % Max HR: 55.32 bpm Rate Pressure Product: 72094  Dose of Adenosine (mg):  n/a Dose of Lexiscan: 0.4 mg  Dose of Atropine (mg): n/a Dose of Dobutamine: n/a mcg/kg/min (at max HR)  Stress Test Technologist: Esperanza Sheets, CCT Nuclear Technologist: Gonzella Lex, CNMT   Rest Procedure:  Myocardial perfusion imaging was performed at rest 45 minutes following the intravenous administration of Technetium 70m Sestamibi. Stress Procedure:  The patient received IV Lexiscan 0.4 mg over 15-seconds.  Technetium 53m Sestamibi injected at 30-seconds.  There were no significant changes with Lexiscan.  Quantitative spect  images were obtained after a 45 minute delay.  Transient Ischemic Dilatation (Normal <1.22):  1.07 Lung/Heart Ratio (Normal <0.45):  0.27 QGS EDV:  85 ml QGS ESV:  27 ml LV Ejection Fraction: 68%     Rest ECG: NSR - Normal EKG  Stress ECG: No significant change from baseline ECG  QPS Raw Data Images:  There is a breast shadow that accounts for the anterior attenuation. Stress Images:  mildly reduced anterior wall tracer uptake. Rest Images:  Comparison with the stress images reveals no significant change. Subtraction (SDS):  There is a fixed anterior defect that is most consistent with breast attenuation. LV Wall Motion:  NL LV Function; NL Wall Motion  Impression Exercise Capacity:  Lexiscan with no exercise. BP Response:  Normal blood pressure response. Clinical Symptoms:  No significant symptoms noted. ECG Impression:  No significant ST segment change suggestive of ischemia. Comparison with Prior Nuclear Study: No previous nuclear study performed   Overall Impression:  Low risk stress nuclear study with mild anterior wall breast attenuation artifact.   Thurmon Fair, MD  07/21/2013 2:19 PM     Thurmon Fair, MD  07/21/2013 2:15 PM

## 2013-07-27 ENCOUNTER — Encounter: Payer: Self-pay | Admitting: Internal Medicine

## 2013-07-27 ENCOUNTER — Ambulatory Visit (INDEPENDENT_AMBULATORY_CARE_PROVIDER_SITE_OTHER): Payer: Medicare Other | Admitting: Internal Medicine

## 2013-07-27 VITALS — BP 138/60 | HR 60 | Ht 62.0 in | Wt 148.9 lb

## 2013-07-27 DIAGNOSIS — R0902 Hypoxemia: Secondary | ICD-10-CM

## 2013-07-27 DIAGNOSIS — I509 Heart failure, unspecified: Secondary | ICD-10-CM

## 2013-07-27 DIAGNOSIS — I10 Essential (primary) hypertension: Secondary | ICD-10-CM

## 2013-07-27 DIAGNOSIS — I5030 Unspecified diastolic (congestive) heart failure: Secondary | ICD-10-CM

## 2013-07-27 NOTE — Progress Notes (Signed)
OFFICE NOTE  Chief Complaint:  Follow-up stress test  Primary Care Physician: Marshia Ly, PA-C  HPI:  Alexis [name] is a pleasant 80 year old female who was recently seen at any and for heart failure exacerbation. She presented at this couple of weeks of increasing shortness of breath, cough, increasing abdominal girth and lower extremity edema. On admission her BNP was elevated and she responded fairly quickly to diuresis. This caused improvement in her breathing. She was noted to be hypertensive and continues to be hypertensive today. She has had some acute on chronic renal insufficiency.  She reports that when she was discharged she was sent home on oxygen, but was never told why she needed it. She followed up with her primary care provider, who is with [facility] and he recommended that she see a cardiologist to help determine whether she needs to continue to take oxygen. She apparently sought out our group and on seeing her today.  Her past medical history is significant for hypertension, diabetes, dyslipidemia and family history of heart disease.  She reports that since discharge her breathing is better, however when she does marked exertion she does feel short of breath. She is not using her oxygen. She denies any chest pain.  An echocardi[facility] performed at [facility] showed an EF of 65-70%, with moderate concentric LVH, and no wall motion abnormalities.  She's never had a stress test evaluation.  Alexis [name] returns today for followup of her stress test. This is a nuclear stress test that showed no reversible ischemia and a preserved EF greater than 70%. She reports that she's been undergoing rehabilitation at home and feels that her shortness of breath is improving. It is now a point where she does not feel that she needs oxygen. We went ahead and ambulated her around the office today and her baseline SpO2 was 98% and decreased to 95% with exertion. This is markedly  improved from her previous office visit where her oxygen saturation dropped to 74% on room air.  Also at her last office visit I started her on amlodipine 10 mg daily and this helped both lower blood pressure and I suspect will ask her pulmonary artery pressures as well.  PMHx:  Past Medical History  Diagnosis Date  . Diabetes mellitus without complication   . Hypertension   . Neuropathy   . Anxiety   . PONV (postoperative nausea and vomiting)   . CHF (congestive heart failure)     Past Surgical History  Procedure Laterality Date  . Hip fracture surgery    . Abdominal hysterectomy      FAMHx:  No family history on file.  SOCHx:   reports that she has never smoked. She has never used smokeless tobacco. She reports that she does not drink alcohol or use illicit drugs.  ALLERGIES:  Allergies  Allergen Reactions  . Other     novocaine broke mouth out  . Codeine Hives  . Penicillins Hives  . Procaine Hives  . Yellow Dyes (Non-Tartrazine) Other (See Comments)    "deathly allergic" No other information given to explain reaction.    ROS: A comprehensive review of systems was negative except for: Respiratory: positive for dyspnea on exertion  HOME MEDS: Current Outpatient Prescriptions  Medication Sig Dispense Refill  . albuterol (PROVENTIL HFA;VENTOLIN HFA) 108 (90 BASE) MCG/ACT inhaler Inhale 2 puffs into the lungs every 6 (six) hours as needed for wheezing or shortness of breath.  1 Inhaler  3  .  amLODipine (NORVASC) 10 MG tablet Take 10 mg by mouth daily.      Marland Kitchen. aspirin EC 81 MG tablet Take 81 mg by mouth at bedtime.      . cholecalciferol (VITAMIN D) 1000 UNITS tablet Take 1,000 Units by mouth at bedtime.       Marland Kitchen. escitalopram (LEXAPRO) 10 MG tablet Take 10 mg by mouth daily.      . fenofibrate 160 MG tablet Take 160 mg by mouth daily.      . furosemide (LASIX) 40 MG tablet Take 1 tablet (40 mg total) by mouth daily.  30 tablet  2  . gabapentin (NEURONTIN) 100 MG capsule  Take 100-200 mg by mouth 3 (three) times daily. Take 1 capsule (100 mg) morning and mid day, take 2 capsules (200 mg) at bedtime      . glipiZIDE (GLUCOTROL XL) 10 MG 24 hr tablet Take 10 mg by mouth daily with breakfast.       . metoprolol tartrate (LOPRESSOR) 25 MG tablet Take 12.5 mg by mouth 2 (two) times daily.      . Multiple Vitamin (MULTIVITAMIN WITH MINERALS) TABS tablet Take 1 tablet by mouth daily.      . sitaGLIPtin (JANUVIA) 50 MG tablet Take 50 mg by mouth at bedtime.      . terbinafine (LAMISIL) 250 MG tablet Take 250 mg by mouth daily.       No current facility-administered medications for this visit.    LABS/IMAGING: No results found for this or any previous visit (from the past 48 hour(s)). No results found.  VITALS: BP 138/60  Pulse 60  Ht 5\' 2"  (1.575 m)  Wt 148 lb 14.4 oz (67.541 kg)  BMI 27.23 kg/m2  SpO2 95%  Ambulatory oxygen saturation: O2 saturation at rest was 94%, which reduced to 79% with exercise  EXAM: General appearance: alert and no distress Neck: no carotid bruit and no JVD Lungs: diminished breath sounds bibasilar Heart: regular rate and rhythm, S1, S2 normal, no murmur, click, rub or gallop Abdomen: soft, non-tender; bowel sounds normal; no masses,  no organomegaly Extremities: extremities normal, atraumatic, no cyanosis or edema Pulses: 2+ and symmetric Skin: Skin color, texture, turgor normal. No rashes or lesions Neurologic: Grossly normal Psych: Mood, affect normal  EKG: Normal sinus rhythm at 63  ASSESSMENT: 1. Recent acute diastolic heart failure exacerbation 2. Hypertensive heart disease with moderate LVH 3. Hypertension - controlled 4. Dyslipidemia 5. Diabetes type 2 6. Family history of heart disease  PLAN: 1.   Alexis [name] is improved today with regards to her hypoxia and shortness of breath. Her ambulatory room air saturation was 95%. It appears that she does not need to wear oxygen unless she feels markedly dyspneic.  Blood pressure is much improved with amlodipine. I recommend continuing her current medications and plan to see her back in 6 months.  Chrystie NoseKenneth [name], MD, [facility] Center[facility]ACC Attending Cardiologist [facility]  HILTY,Kenneth C 07/27/2013, 4:42 PM

## 2013-07-27 NOTE — Patient Instructions (Signed)
Your physician wants you to follow-up in:  6 months. You will receive a reminder letter in the mail two months in advance. If you don't receive a letter, please call our office to schedule the follow-up appointment.   

## 2014-05-12 ENCOUNTER — Encounter (HOSPITAL_COMMUNITY): Payer: Self-pay | Admitting: Emergency Medicine

## 2014-05-12 ENCOUNTER — Emergency Department (HOSPITAL_COMMUNITY): Payer: Medicare Other

## 2014-05-12 ENCOUNTER — Inpatient Hospital Stay (HOSPITAL_COMMUNITY)
Admission: EM | Admit: 2014-05-12 | Discharge: 2014-05-14 | DRG: 291 | Disposition: A | Discharge status: Home / Self Care | Payer: Medicare Other | Attending: Internal Medicine | Admitting: Internal Medicine

## 2014-05-12 DIAGNOSIS — J9621 Acute and chronic respiratory failure with hypoxia: Secondary | ICD-10-CM | POA: Diagnosis present

## 2014-05-12 DIAGNOSIS — Z7951 Long term (current) use of inhaled steroids: Secondary | ICD-10-CM

## 2014-05-12 DIAGNOSIS — J479 Bronchiectasis, uncomplicated: Secondary | ICD-10-CM | POA: Diagnosis present

## 2014-05-12 DIAGNOSIS — G629 Polyneuropathy, unspecified: Secondary | ICD-10-CM | POA: Diagnosis present

## 2014-05-12 DIAGNOSIS — Z9981 Dependence on supplemental oxygen: Secondary | ICD-10-CM

## 2014-05-12 DIAGNOSIS — J9622 Acute and chronic respiratory failure with hypercapnia: Secondary | ICD-10-CM

## 2014-05-12 DIAGNOSIS — I5032 Chronic diastolic (congestive) heart failure: Secondary | ICD-10-CM | POA: Diagnosis present

## 2014-05-12 DIAGNOSIS — Z880 Allergy status to penicillin: Secondary | ICD-10-CM | POA: Diagnosis not present

## 2014-05-12 DIAGNOSIS — J810 Acute pulmonary edema: Secondary | ICD-10-CM

## 2014-05-12 DIAGNOSIS — Z91041 Radiographic dye allergy status: Secondary | ICD-10-CM

## 2014-05-12 DIAGNOSIS — Z79899 Other long term (current) drug therapy: Secondary | ICD-10-CM

## 2014-05-12 DIAGNOSIS — Z888 Allergy status to other drugs, medicaments and biological substances status: Secondary | ICD-10-CM | POA: Diagnosis not present

## 2014-05-12 DIAGNOSIS — I509 Heart failure, unspecified: Secondary | ICD-10-CM

## 2014-05-12 DIAGNOSIS — I10 Essential (primary) hypertension: Secondary | ICD-10-CM | POA: Diagnosis present

## 2014-05-12 DIAGNOSIS — F419 Anxiety disorder, unspecified: Secondary | ICD-10-CM | POA: Diagnosis present

## 2014-05-12 DIAGNOSIS — J9601 Acute respiratory failure with hypoxia: Secondary | ICD-10-CM | POA: Diagnosis present

## 2014-05-12 DIAGNOSIS — Z7982 Long term (current) use of aspirin: Secondary | ICD-10-CM

## 2014-05-12 DIAGNOSIS — J811 Chronic pulmonary edema: Secondary | ICD-10-CM | POA: Diagnosis present

## 2014-05-12 DIAGNOSIS — Z9114 Patient's other noncompliance with medication regimen: Secondary | ICD-10-CM | POA: Diagnosis present

## 2014-05-12 DIAGNOSIS — I5033 Acute on chronic diastolic (congestive) heart failure: Principal | ICD-10-CM | POA: Diagnosis present

## 2014-05-12 DIAGNOSIS — E0840 Diabetes mellitus due to underlying condition with diabetic neuropathy, unspecified: Secondary | ICD-10-CM

## 2014-05-12 DIAGNOSIS — Z66 Do not resuscitate: Secondary | ICD-10-CM | POA: Diagnosis present

## 2014-05-12 DIAGNOSIS — J441 Chronic obstructive pulmonary disease with (acute) exacerbation: Secondary | ICD-10-CM | POA: Diagnosis present

## 2014-05-12 DIAGNOSIS — E119 Type 2 diabetes mellitus without complications: Secondary | ICD-10-CM | POA: Diagnosis present

## 2014-05-12 DIAGNOSIS — E0849 Diabetes mellitus due to underlying condition with other diabetic neurological complication: Secondary | ICD-10-CM

## 2014-05-12 HISTORY — DX: Chronic obstructive pulmonary disease, unspecified: J44.9

## 2014-05-12 HISTORY — DX: Family history of other specified conditions: Z84.89

## 2014-05-12 LAB — BASIC METABOLIC PANEL
ANION GAP: 11 (ref 5–15)
BUN: 16 mg/dL (ref 6–23)
CALCIUM: 9.3 mg/dL (ref 8.4–10.5)
CO2: 27 meq/L (ref 19–32)
Chloride: 103 mEq/L (ref 96–112)
Creatinine, Ser: 1.2 mg/dL — ABNORMAL HIGH (ref 0.50–1.10)
GFR calc Af Amer: 48 mL/min — ABNORMAL LOW (ref >90)
GFR calc non Af Amer: 42 mL/min — ABNORMAL LOW (ref >90)
Glucose, Bld: 174 mg/dL — ABNORMAL HIGH (ref 70–99)
Potassium: 4.3 mEq/L (ref 3.7–5.3)
SODIUM: 141 meq/L (ref 137–147)

## 2014-05-12 LAB — I-STAT TROPONIN, ED: Troponin i, poc: 0.01 ng/mL (ref 0.00–0.08)

## 2014-05-12 LAB — GLUCOSE, CAPILLARY
GLUCOSE-CAPILLARY: 178 mg/dL — AB (ref 70–99)
Glucose-Capillary: 165 mg/dL — ABNORMAL HIGH (ref 70–99)
Glucose-Capillary: 308 mg/dL — ABNORMAL HIGH (ref 70–99)
Glucose-Capillary: 223 mg/dL — ABNORMAL HIGH (ref 70–99)

## 2014-05-12 LAB — CBC
HCT: 35.4 % — ABNORMAL LOW (ref 36.0–46.0)
Hemoglobin: 11.1 g/dL — ABNORMAL LOW (ref 12.0–15.0)
MCH: 26.4 pg (ref 26.0–34.0)
MCHC: 31.4 g/dL (ref 30.0–36.0)
MCV: 84.1 fL (ref 78.0–100.0)
PLATELETS: 293 10*3/uL (ref 150–400)
RBC: 4.21 MIL/uL (ref 3.87–5.11)
RDW: 15.1 % (ref 11.5–15.5)
WBC: 9.6 10*3/uL (ref 4.0–10.5)

## 2014-05-12 LAB — TROPONIN I
Troponin I: <0.3 ng/mL (ref <0.30)
Troponin I: <0.3 ng/mL (ref <0.30)

## 2014-05-12 LAB — PRO B NATRIURETIC PEPTIDE: PRO B NATRI PEPTIDE: 2482 pg/mL — AB (ref 0–450)

## 2014-05-12 MED ORDER — MECLIZINE HCL 25 MG PO TABS
25.0000 mg | ORAL_TABLET | Freq: Three times a day (TID) | ORAL | Status: DC | PRN
  Filled 2014-05-12: qty 1

## 2014-05-12 MED ORDER — FUROSEMIDE 10 MG/ML IJ SOLN
40.0000 mg | Freq: Once | INTRAMUSCULAR | Status: AC
  Administered 2014-05-12: 40 mg via INTRAVENOUS
  Filled 2014-05-12: qty 4

## 2014-05-12 MED ORDER — ASPIRIN EC 81 MG PO TBEC: 81.0000 mg | DELAYED_RELEASE_TABLET | Freq: Every day | ORAL | Status: DC

## 2014-05-12 MED ORDER — GABAPENTIN 100 MG PO CAPS
200.0000 mg | ORAL_CAPSULE | Freq: Every day | ORAL | Status: DC
  Administered 2014-05-12 – 2014-05-13 (×2): 200 mg via ORAL
  Filled 2014-05-12 (×3): qty 2

## 2014-05-12 MED ORDER — NITROGLYCERIN 0.4 MG SL SUBL: 0.4000 mg | SUBLINGUAL_TABLET | SUBLINGUAL | Status: DC | PRN

## 2014-05-12 MED ORDER — GUAIFENESIN-DM 100-10 MG/5ML PO SYRP: 10.0000 mL | ORAL_SOLUTION | ORAL | Status: DC | PRN

## 2014-05-12 MED ORDER — ALBUTEROL SULFATE (2.5 MG/3ML) 0.083% IN NEBU: 2.5000 mg | INHALATION_SOLUTION | RESPIRATORY_TRACT | Status: DC | PRN

## 2014-05-12 MED ORDER — METOPROLOL TARTRATE 12.5 MG HALF TABLET
12.5000 mg | ORAL_TABLET | Freq: Two times a day (BID) | ORAL | Status: DC
  Administered 2014-05-12 – 2014-05-14 (×4): 12.5 mg via ORAL
  Filled 2014-05-12 (×5): qty 1

## 2014-05-12 MED ORDER — FENOFIBRATE 160 MG PO TABS
160.0000 mg | ORAL_TABLET | Freq: Every day | ORAL | Status: DC
  Administered 2014-05-12 – 2014-05-14 (×3): 160 mg via ORAL
  Filled 2014-05-12 (×3): qty 1

## 2014-05-12 MED ORDER — TERBINAFINE HCL 250 MG PO TABS
250.0000 mg | ORAL_TABLET | Freq: Every day | ORAL | Status: DC
  Administered 2014-05-12 – 2014-05-14 (×3): 250 mg via ORAL
  Filled 2014-05-12 (×3): qty 1

## 2014-05-12 MED ORDER — DIPHENHYDRAMINE HCL 25 MG PO CAPS: 25.0000 mg | ORAL_CAPSULE | Freq: Four times a day (QID) | ORAL | Status: DC | PRN

## 2014-05-12 MED ORDER — INSULIN ASPART 100 UNIT/ML ~~LOC~~ SOLN
0.0000 [IU] | Freq: Three times a day (TID) | SUBCUTANEOUS | Status: DC
  Administered 2014-05-12: 2 [IU] via SUBCUTANEOUS
  Administered 2014-05-12: 7 [IU] via SUBCUTANEOUS
  Administered 2014-05-13: 3 [IU] via SUBCUTANEOUS

## 2014-05-12 MED ORDER — ALBUTEROL SULFATE (2.5 MG/3ML) 0.083% IN NEBU
5.0000 mg | INHALATION_SOLUTION | Freq: Once | RESPIRATORY_TRACT | Status: AC
  Administered 2014-05-12: 5 mg via RESPIRATORY_TRACT
  Filled 2014-05-12: qty 6

## 2014-05-12 MED ORDER — GABAPENTIN 100 MG PO CAPS
100.0000 mg | ORAL_CAPSULE | Freq: Two times a day (BID) | ORAL | Status: DC
  Administered 2014-05-12 – 2014-05-14 (×5): 100 mg via ORAL
  Filled 2014-05-12 (×6): qty 1

## 2014-05-12 MED ORDER — ADULT MULTIVITAMIN W/MINERALS CH
1.0000 | ORAL_TABLET | Freq: Every day | ORAL | Status: DC
  Administered 2014-05-12 – 2014-05-14 (×3): 1 via ORAL
  Filled 2014-05-12 (×3): qty 1

## 2014-05-12 MED ORDER — ONDANSETRON HCL 4 MG/2ML IJ SOLN: 4.0000 mg | Freq: Four times a day (QID) | INTRAMUSCULAR | Status: DC | PRN

## 2014-05-12 MED ORDER — ALBUTEROL SULFATE (2.5 MG/3ML) 0.083% IN NEBU
2.5000 mg | INHALATION_SOLUTION | Freq: Four times a day (QID) | RESPIRATORY_TRACT | Status: DC
  Administered 2014-05-12 (×2): 2.5 mg via RESPIRATORY_TRACT
  Filled 2014-05-12 (×3): qty 3

## 2014-05-12 MED ORDER — FUROSEMIDE 10 MG/ML IJ SOLN
40.0000 mg | Freq: Two times a day (BID) | INTRAMUSCULAR | Status: DC
  Administered 2014-05-13 – 2014-05-14 (×2): 40 mg via INTRAVENOUS
  Filled 2014-05-12 (×5): qty 4

## 2014-05-12 MED ORDER — DEXTROMETHORPHAN-GUAIFENESIN 10-100 MG/5ML PO LIQD
10.0000 mL | ORAL | Status: DC | PRN
  Filled 2014-05-12: qty 10

## 2014-05-12 MED ORDER — ENOXAPARIN SODIUM 40 MG/0.4ML ~~LOC~~ SOLN
40.0000 mg | SUBCUTANEOUS | Status: DC
  Administered 2014-05-12 – 2014-05-14 (×3): 40 mg via SUBCUTANEOUS
  Filled 2014-05-12 (×3): qty 0.4

## 2014-05-12 MED ORDER — SODIUM CHLORIDE 0.9 % IJ SOLN
3.0000 mL | Freq: Two times a day (BID) | INTRAMUSCULAR | Status: DC
  Administered 2014-05-12 – 2014-05-14 (×5): 3 mL via INTRAVENOUS

## 2014-05-12 MED ORDER — HYDROCODONE-ACETAMINOPHEN 5-325 MG PO TABS: 1.0000 | ORAL_TABLET | ORAL | Status: DC | PRN

## 2014-05-12 MED ORDER — ACETAMINOPHEN 650 MG RE SUPP: 650.0000 mg | Freq: Four times a day (QID) | RECTAL | Status: DC | PRN

## 2014-05-12 MED ORDER — FUROSEMIDE 10 MG/ML IJ SOLN
40.0000 mg | Freq: Once | INTRAMUSCULAR | Status: AC
Start: 2014-05-12 — End: 2014-05-12
  Administered 2014-05-12: 40 mg via INTRAVENOUS
  Filled 2014-05-12: qty 4

## 2014-05-12 MED ORDER — ESCITALOPRAM OXALATE 10 MG PO TABS
10.0000 mg | ORAL_TABLET | Freq: Every day | ORAL | Status: DC
  Administered 2014-05-12 – 2014-05-14 (×3): 10 mg via ORAL
  Filled 2014-05-12 (×3): qty 1

## 2014-05-12 MED ORDER — HYDRALAZINE HCL 20 MG/ML IJ SOLN: 10.0000 mg | Freq: Three times a day (TID) | INTRAMUSCULAR | Status: DC | PRN

## 2014-05-12 MED ORDER — ONDANSETRON HCL 4 MG PO TABS: 4.0000 mg | ORAL_TABLET | Freq: Four times a day (QID) | ORAL | Status: DC | PRN

## 2014-05-12 MED ORDER — METHYLPREDNISOLONE SODIUM SUCC 40 MG IJ SOLR
40.0000 mg | Freq: Two times a day (BID) | INTRAMUSCULAR | Status: DC
  Administered 2014-05-12 (×2): 40 mg via INTRAVENOUS
  Filled 2014-05-12 (×4): qty 1

## 2014-05-12 MED ORDER — ACETAMINOPHEN 325 MG PO TABS: 650.0000 mg | ORAL_TABLET | Freq: Four times a day (QID) | ORAL | Status: DC | PRN

## 2014-05-12 MED ORDER — DOCUSATE SODIUM 100 MG PO CAPS
100.0000 mg | ORAL_CAPSULE | Freq: Two times a day (BID) | ORAL | Status: DC
  Administered 2014-05-12 – 2014-05-13 (×2): 100 mg via ORAL
  Filled 2014-05-12 (×5): qty 1

## 2014-05-12 MED ORDER — ACETAMINOPHEN 500 MG PO TABS: 500.0000 mg | ORAL_TABLET | Freq: Four times a day (QID) | ORAL | Status: DC | PRN

## 2014-05-12 MED ORDER — ASPIRIN 81 MG PO CHEW
81.0000 mg | CHEWABLE_TABLET | Freq: Every day | ORAL | Status: DC
  Administered 2014-05-12 – 2014-05-13 (×2): 81 mg via ORAL
  Filled 2014-05-12 (×2): qty 1

## 2014-05-12 NOTE — ED Notes (Signed)
Second attempt at report.  

## 2014-05-12 NOTE — ED Provider Notes (Signed)
CSN: [id number]     Arrival date & time 05/12/14  4098 History   First MD Initiated Contact with Patient 05/12/14 [phone]     Chief Complaint  Patient presents with  . Chest Pain  . Shortness of Breath     (Consider location/radiation/quality/duration/timing/severity/associated sxs/prior Treatment) HPI Comments: Pt with hx of CHF, COPD (night O2) and DM comes in with cc of shortness of breath. Pt states in the middle of the night, pt woke up with dyspnea. Pt had mild heaviness to her chest as well. Pt called EMS, and arrives to the ER on bipap. Per EMS pt was hypoxic and in resp distress, breathing heavy and tripoding. She had rales all over. Pt had a recent pneumonia. No new cough currently. Pt does have mild increased leg swelling. No hx of PE, DVT.  Patient is a 80 y.o. female presenting with chest pain and shortness of breath. The history is provided by the patient.  Chest Pain Associated symptoms: shortness of breath   Associated symptoms: no abdominal pain, no cough, no nausea and not vomiting   Shortness of Breath Associated symptoms: chest pain and wheezing   Associated symptoms: no abdominal pain, no cough, no neck pain and no vomiting     Past Medical History  Diagnosis Date  . Diabetes mellitus without complication   . Hypertension   . Neuropathy   . Anxiety   . PONV (postoperative nausea and vomiting)   . CHF (congestive heart failure)   . COPD (chronic obstructive pulmonary disease)    Past Surgical History  Procedure Laterality Date  . Hip fracture surgery    . Abdominal hysterectomy     History reviewed. No pertinent family history. History  Substance Use Topics  . Smoking status: Never Smoker   . Smokeless tobacco: Never Used  . Alcohol Use: No   OB History    No data available     Review of Systems  Constitutional: Negative for activity change.  HENT: Negative for facial swelling.   Respiratory: Positive for shortness of breath and wheezing. Negative  for cough.   Cardiovascular: Positive for chest pain.  Gastrointestinal: Negative for nausea, vomiting, abdominal pain, diarrhea, constipation, blood in stool and abdominal distention.  Genitourinary: Negative for hematuria and difficulty urinating.  Musculoskeletal: Negative for neck pain.  Skin: Negative for color change.  Neurological: Negative for speech difficulty.  Hematological: Does not bruise/bleed easily.  Psychiatric/Behavioral: Negative for confusion.  All other systems reviewed and are negative.     Allergies  Other; Codeine; Penicillins; Procaine; and Yellow dyes (non-tartrazine)  Home Medications   Prior to Admission medications   Medication Sig Start Date End Date Taking? Authorizing Provider  acetaminophen (TYLENOL) 500 MG tablet Take 500 mg by mouth every 6 (six) hours as needed for moderate pain or headache.   Yes Historical Provider, MD  albuterol (PROVENTIL HFA;VENTOLIN HFA) 108 (90 BASE) MCG/ACT inhaler Inhale 2 puffs into the lungs every 6 (six) hours as needed for wheezing or shortness of breath. 06/11/13  Yes Nishant Dhungel, MD  amLODipine (NORVASC) 10 MG tablet Take 10 mg by mouth daily. 07/08/13  Yes Chrystie Nose, MD  aspirin EC 81 MG tablet Take 81 mg by mouth at bedtime.   Yes Historical Provider, MD  Chlorpheniramine-DM (CORICIDIN COUGH/COLD) 4-30 MG TABS Take 1 tablet by mouth every 6 (six) hours as needed (for cough and cold symptoms).   Yes Historical Provider, MD  cholecalciferol (VITAMIN D) 1000 UNITS tablet Take 1,000  Units by mouth at bedtime.    Yes Historical Provider, MD  dextromethorphan-guaiFENesin (TUSSIN DM CLEAR) 10-100 MG/5ML liquid Take 10 mLs by mouth every 4 (four) hours as needed for cough.   Yes Historical Provider, MD  diphenhydrAMINE (BENADRYL) 25 mg capsule Take 25 mg by mouth every 6 (six) hours as needed for allergies.   Yes Historical Provider, MD  escitalopram (LEXAPRO) 10 MG tablet Take 10 mg by mouth daily.   Yes Historical  Provider, MD  fenofibrate 160 MG tablet Take 160 mg by mouth daily.   Yes Historical Provider, MD  furosemide (LASIX) 40 MG tablet Take 1 tablet (40 mg total) by mouth daily. 07/19/13  Yes Meredeth IdeGagan S Lama, MD  gabapentin (NEURONTIN) 100 MG capsule Take 100-200 mg by mouth 3 (three) times daily. Take 1 capsule (100 mg) morning and mid day, take 2 capsules (200 mg) at bedtime   Yes Historical Provider, MD  glyBURIDE (DIABETA) 5 MG tablet Take 5 mg by mouth daily with breakfast. 10/19/13 10/19/14 Yes Historical Provider, MD  guaiFENesin (MUCINEX) 600 MG 12 hr tablet Take 600 mg by mouth 2 (two) times daily as needed for cough or to loosen phlegm.   Yes Historical Provider, MD  Inulin (FIBER CHOICE) 1.5 G CHEW Chew 1-2 tablets by mouth daily as needed (for constipation).   Yes Historical Provider, MD  meclizine (ANTIVERT) 25 MG tablet Take 25 mg by mouth 3 (three) times daily as needed for dizziness.   Yes Historical Provider, MD  Multiple Vitamin (MULTIVITAMIN WITH MINERALS) TABS tablet Take 1 tablet by mouth daily.   Yes Historical Provider, MD  Multiple Vitamins-Minerals (ICAPS PO) Take 1 capsule by mouth daily.   Yes Historical Provider, MD  sitaGLIPtin (JANUVIA) 50 MG tablet Take 50 mg by mouth at bedtime.   Yes Historical Provider, MD  terbinafine (LAMISIL) 250 MG tablet Take 250 mg by mouth daily.   Yes Historical Provider, MD  doxycycline (VIBRAMYCIN) 100 MG capsule Take 1 capsule by mouth 2 (two) times daily. 04/30/14   Historical Provider, MD   BP 148/52 mmHg  Pulse 67  Temp(Src) 97.8 F (36.6 C) (Oral)  Resp 18  Ht 5\' 1"  (1.549 m)  Wt 154 lb (69.854 kg)  BMI 29.11 kg/m2  SpO2 97% Physical Exam  Constitutional: She is oriented to person, place, and time. She appears well-developed and well-nourished.  HENT:  Head: Normocephalic and atraumatic.  Eyes: EOM are normal. Pupils are equal, round, and reactive to light.  Neck: Neck supple. No JVD present.  Cardiovascular: Normal rate, regular  rhythm and normal heart sounds.   No murmur heard. Pulmonary/Chest: Effort normal. No respiratory distress. She has no wheezes. She has rales.  + basilar rales, reduced air movement on the R side  Abdominal: Soft. She exhibits no distension. There is no tenderness. There is no rebound and no guarding.  Musculoskeletal: She exhibits edema.  Neurological: She is alert and oriented to person, place, and time.  Skin: Skin is warm and dry.  Nursing note and vitals reviewed.   ED Course  Procedures (including critical care time) Labs Review Labs Reviewed  CBC - Abnormal; Notable for the following:    Hemoglobin 11.1 (*)    HCT 35.4 (*)    All other components within normal limits  BASIC METABOLIC PANEL - Abnormal; Notable for the following:    Glucose, Bld 174 (*)    Creatinine, Ser 1.20 (*)    GFR calc non Af Amer 42 (*)  GFR calc Af Amer 48 (*)    All other components within normal limits  PRO B NATRIURETIC PEPTIDE  I-STAT TROPOININ, ED    Imaging Review Dg Chest Port 1 View  05/12/2014   CLINICAL DATA:  Chest pain and shortness of breath  EXAM: PORTABLE CHEST - 1 VIEW  COMPARISON:  07/15/2013  FINDINGS: There is cardiomegaly which is stable from prior. Negative aortic contours. Pulmonary hyperinflation which is chronic. There is interstitial coarsening with congested appearance of the hila. Suspect trace pleural effusions. Remote deformity of the left ribs.  IMPRESSION: Mild CHF.   Electronically Signed   By: Tiburcio Pea M.D.   On: 05/12/2014 04:26     EKG Interpretation   Date/Time:  Thursday May 12 2014 03:31:02 EST Ventricular Rate:  94 PR Interval:  205 QRS Duration: 79 QT Interval:  370 QTC Calculation: 463 R Axis:   39 Text Interpretation:  Sinus rhythm no acute changes Confirmed by Rhunette Croft,  MD, Hala Narula [phone]) on 05/12/2014 5:27:07 AM      MDM   Final diagnoses:  Acute congestive heart failure, unspecified congestive heart failure type  Acute  pulmonary edema  Acute respiratory failure with hypoxia   Pt comes in with cc of DIB. Pt had acute hypoxic respiratory failure, and presented with bipap on. Has hx of COPD and CHF - however, hx and exam consistent with acute CHF exacerbation - and CXR verified it. Pt was weaned off of bipap in the ER - and she has tolerated that well. Reassessment x 2 times, now on nasal canula. She is not wheezing -and thus no breathing tx given here.   Pt 's BP is marginally elevated only. Will gibe iv lasix, and admit. Stable for telemetry at this time.  CRITICAL CARE Performed by: Derwood Kaplan   Total critical care time: 40 min  Critical care time was exclusive of separately billable procedures and treating other patients.  Critical care was necessary to treat or prevent imminent or life-threatening deterioration.  Critical care was time spent personally by me on the following activities: development of treatment plan with patient and/or surrogate as well as nursing, discussions with consultants, evaluation of patient's response to treatment, examination of patient, obtaining history from patient or surrogate, ordering and performing treatments and interventions, ordering and review of laboratory studies, ordering and review of radiographic studies, pulse oximetry and re-evaluation of patient's condition.   Derwood Kaplan, MD 05/12/14 [phone]

## 2014-05-12 NOTE — ED Notes (Signed)
Breathing tx completed. Pt placed on bedpan.

## 2014-05-12 NOTE — ED Notes (Signed)
Attempted report 

## 2014-05-12 NOTE — Progress Notes (Signed)
Pt BP 171/51 HR 89, pt on metoprolol 12.5 mg po at home not ordered.  Paged Dr. Hollace Hayward

## 2014-05-12 NOTE — Progress Notes (Signed)
Pt admitted to room 2W38 from ED. Pt alert and responsive, HOH, denies pain. Paged Dr. Beatriz Chancellor pt admitted to room 2W38.

## 2014-05-12 NOTE — Progress Notes (Signed)
UR Completed.  Alexis [name] Jane [phone]  

## 2014-05-12 NOTE — ED Notes (Signed)
Pt brought in by EMS. Pt called out for chest pain and SOB. Initially lung sounds were clear for EMS. When pt was in the ambulance, she scooted herself up in the stretcher, and her SOB got worse. EMS states at that time, she was satting 92% on 4L with crackles throughout. After a breathing treatment, pt with rales throughout. Pt received 1 NTG with no relief, and had taken 325 ASA prior to EMS arrival. Pt brought in on bi-pap. Initially satting 83% on RA.

## 2014-05-12 NOTE — H&P (Signed)
[facility] History and Physical  Alexis GivensChristine E Robbs UJW:[id number] DOB: 05/04/1932 DOA: 05/12/2014  Referring physician: Dr Catha GosselinNannavity.  PCP: Marshia LyMICHAELS,CHASE A, PA-C   Chief Complaint: SOB  HPI: Alexis [name] is a 80 y.o. female with PMH significant for COPD on 2 L of oxygen at home, Diastolic HF, HTN who presents complaining of SOB that got worse overnight. She also had episode of chest pain, middle of chest, pressure type, lasted for 30 minutes. When EMS arrived patient was started on BIPAP, nitroglycerin and and nebulizer treatment. Patient was wean off BIPAP in the ED. She is breathing better. Chest pain has resolved. She got over PNA recently. She was on antibiotics. She has not been taking her lasix as she should. Does not take it every day, it makes her pee. She has gain weight since last year.  Evaluation in the ED; chest x ray with CHF finding, BNP at 2482.  Review of Systems:  Negative, except as per HPI   Past Medical History  Diagnosis Date  . Diabetes mellitus without complication   . Hypertension   . Neuropathy   . Anxiety   . PONV (postoperative nausea and vomiting)   . CHF (congestive heart failure)   . COPD (chronic obstructive pulmonary disease)    Past Surgical History  Procedure Laterality Date  . Hip fracture surgery    . Abdominal hysterectomy     Social History:  reports that she has never smoked. She has never used smokeless tobacco. She reports that she does not drink alcohol or use illicit drugs.  Allergies  Allergen Reactions  . Other     novocaine broke mouth out  . Codeine Hives  . Penicillins Hives  . Procaine Hives  . Yellow Dyes (Non-Tartrazine) Other (See Comments)    "deathly allergic" No other information given to explain reaction.   Family history; mother died of heart disease, father died of cancer.   Prior to Admission medications   Medication Sig Start Date End Date Taking? Authorizing Provider  acetaminophen  (TYLENOL) 500 MG tablet Take 500 mg by mouth every 6 (six) hours as needed for moderate pain or headache.   Yes Historical Provider, MD  albuterol (PROVENTIL HFA;VENTOLIN HFA) 108 (90 BASE) MCG/ACT inhaler Inhale 2 puffs into the lungs every 6 (six) hours as needed for wheezing or shortness of breath. 06/11/13  Yes Nishant Dhungel, MD  amLODipine (NORVASC) 10 MG tablet Take 10 mg by mouth daily. 07/08/13  Yes Chrystie NoseKenneth [name], MD  aspirin EC 81 MG tablet Take 81 mg by mouth at bedtime.   Yes Historical Provider, MD  Chlorpheniramine-DM (CORICIDIN COUGH/COLD) 4-30 MG TABS Take 1 tablet by mouth every 6 (six) hours as needed (for cough and cold symptoms).   Yes Historical Provider, MD  cholecalciferol (VITAMIN D) 1000 UNITS tablet Take 1,000 Units by mouth at bedtime.    Yes Historical Provider, MD  dextromethorphan-guaiFENesin (TUSSIN DM CLEAR) 10-100 MG/5ML liquid Take 10 mLs by mouth every 4 (four) hours as needed for cough.   Yes Historical Provider, MD  diphenhydrAMINE (BENADRYL) 25 mg capsule Take 25 mg by mouth every 6 (six) hours as needed for allergies.   Yes Historical Provider, MD  escitalopram (LEXAPRO) 10 MG tablet Take 10 mg by mouth daily.   Yes Historical Provider, MD  fenofibrate 160 MG tablet Take 160 mg by mouth daily.   Yes Historical Provider, MD  furosemide (LASIX) 40 MG tablet Take 1 tablet (40 mg total) by mouth daily.  07/19/13  Yes Meredeth Ide, MD  gabapentin (NEURONTIN) 100 MG capsule Take 100-200 mg by mouth 3 (three) times daily. Take 1 capsule (100 mg) morning and mid day, take 2 capsules (200 mg) at bedtime   Yes Historical Provider, MD  glyBURIDE (DIABETA) 5 MG tablet Take 5 mg by mouth daily with breakfast. 10/19/13 10/19/14 Yes Historical Provider, MD  guaiFENesin (MUCINEX) 600 MG 12 hr tablet Take 600 mg by mouth 2 (two) times daily as needed for cough or to loosen phlegm.   Yes Historical Provider, MD  Inulin (FIBER CHOICE) 1.5 G CHEW Chew 1-2 tablets by mouth daily as  needed (for constipation).   Yes Historical Provider, MD  meclizine (ANTIVERT) 25 MG tablet Take 25 mg by mouth 3 (three) times daily as needed for dizziness.   Yes Historical Provider, MD  Multiple Vitamin (MULTIVITAMIN WITH MINERALS) TABS tablet Take 1 tablet by mouth daily.   Yes Historical Provider, MD  Multiple Vitamins-Minerals (ICAPS PO) Take 1 capsule by mouth daily.   Yes Historical Provider, MD  sitaGLIPtin (JANUVIA) 50 MG tablet Take 50 mg by mouth at bedtime.   Yes Historical Provider, MD  terbinafine (LAMISIL) 250 MG tablet Take 250 mg by mouth daily.   Yes Historical Provider, MD  doxycycline (VIBRAMYCIN) 100 MG capsule Take 1 capsule by mouth 2 (two) times daily. 04/30/14   Historical Provider, MD   Physical Exam: Filed Vitals:   05/12/14 0700 05/12/14 0715 05/12/14 0730 05/12/14 0745  BP: 145/49 140/49 155/44 161/47  Pulse: 67 69 75 71  Temp:      TempSrc:      Resp: 20 18 18 21   Height:      Weight:      SpO2: 97% 98% 97% 94%    Wt Readings from Last 3 Encounters:  05/12/14 69.854 kg (154 lb)  07/27/13 67.541 kg (148 lb 14.4 oz)  07/21/13 66.679 kg (147 lb)    General:  Appears calm and comfortable Eyes: PERRL, normal lids, irises & conjunctiva ENT: grossly normal hearing, lips & tongue Neck: no LAD, masses or thyromegaly, positive JVD Cardiovascular: RRR, no m/r/g. No LE edema. Telemetry: SR, no arrhythmias  Respiratory: Normal respiratory effort. Bilateral crackles and wheezing.  Abdomen: soft, nt nd, obese.  Skin: no rash or induration seen on limited exam Musculoskeletal: grossly normal tone BUE/BLE Psychiatric: grossly normal mood and affect, speech fluent and appropriate Neurologic: grossly non-focal.          Labs on Admission:  Basic Metabolic Panel:  Recent Labs Lab 05/12/14 0350  NA 141  K 4.3  CL 103  CO2 27  GLUCOSE 174*  BUN 16  CREATININE 1.20*  CALCIUM 9.3   Liver Function Tests: No results for input(s): AST, ALT, ALKPHOS,  BILITOT, PROT, ALBUMIN in the last 168 hours. No results for input(s): LIPASE, AMYLASE in the last 168 hours. No results for input(s): AMMONIA in the last 168 hours. CBC:  Recent Labs Lab 05/12/14 0350  WBC 9.6  HGB 11.1*  HCT 35.4*  MCV 84.1  PLT 293   Cardiac Enzymes: No results for input(s): CKTOTAL, CKMB, CKMBINDEX, TROPONINI in the last 168 hours.  BNP (last 3 results)  Recent Labs  06/10/13 0231 07/15/13 2013 05/12/14 0350  PROBNP 2374.0* 1598.0* 2482.0*   CBG: No results for input(s): GLUCAP in the last 168 hours.  Radiological Exams on Admission: Dg Chest Port 1 View  05/12/2014   CLINICAL DATA:  Chest pain and shortness of breath  EXAM: PORTABLE CHEST - 1 VIEW  COMPARISON:  07/15/2013  FINDINGS: There is cardiomegaly which is stable from prior. Negative aortic contours. Pulmonary hyperinflation which is chronic. There is interstitial coarsening with congested appearance of the hila. Suspect trace pleural effusions. Remote deformity of the left ribs.  IMPRESSION: Mild CHF.   Electronically Signed   By: Tiburcio Pea M.D.   On: 05/12/2014 04:26    EKG: Independently reviewed. Sinus rhythm, no ST elevation.   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  No complaint with lasix.  Admit to telemetry, cycle cardiac enzymes.  IV lasix 40 mg IV BID, adjust depending on renal function and volume status.  Nebulizer treatments,  IV solumedrol.   2-Acute Diastolic Heart failure Exacerbation; No complaint with medications.  IV lasix 40 mg IV BID. Adjust as needed.  Daily weight, strict I and o.   3-Diabetes; hold oral hypoglycemic agents. SSI.    4-Neuropathy; continue with gabapentin.   5-Acute COPD exacerbation; nebulizer , solumedrol.   6-Chest pain; cycle enzymes. Treat heart failure. Nitroglycerin PRN.  7-HTN; hold Norvasc for now, IV lasix.    Code Status: Patient wishes to be DNR, ED nurse was at time of decision.  DVT Prophylaxis:Lovenox.  Family Communication: Care discussed with patient.  Disposition Plan: expect 3 to 4 days.   Time spent: 75 minutes.   Hartley Barefoot A [facility] Pager [phone]

## 2014-05-13 LAB — GLUCOSE, CAPILLARY
GLUCOSE-CAPILLARY: 116 mg/dL — AB (ref 70–99)
GLUCOSE-CAPILLARY: 279 mg/dL — AB (ref 70–99)
Glucose-Capillary: 223 mg/dL — ABNORMAL HIGH (ref 70–99)
Glucose-Capillary: 238 mg/dL — ABNORMAL HIGH (ref 70–99)

## 2014-05-13 LAB — BASIC METABOLIC PANEL
Anion gap: 8 (ref 5–15)
BUN: 30 mg/dL — AB (ref 6–23)
CO2: 33 mEq/L — ABNORMAL HIGH (ref 19–32)
CREATININE: 1.42 mg/dL — AB (ref 0.50–1.10)
Calcium: 9.2 mg/dL (ref 8.4–10.5)
Chloride: 98 mEq/L (ref 96–112)
GFR, EST AFRICAN AMERICAN: 39 mL/min — AB (ref >90)
GFR, EST NON AFRICAN AMERICAN: 34 mL/min — AB (ref >90)
Glucose, Bld: 264 mg/dL — ABNORMAL HIGH (ref 70–99)
Potassium: 4.5 mEq/L (ref 3.7–5.3)
Sodium: 139 mEq/L (ref 137–147)

## 2014-05-13 LAB — CBC
HCT: 33.4 % — ABNORMAL LOW (ref 36.0–46.0)
Hemoglobin: 10.5 g/dL — ABNORMAL LOW (ref 12.0–15.0)
MCH: 26.6 pg (ref 26.0–34.0)
MCHC: 31.4 g/dL (ref 30.0–36.0)
MCV: 84.6 fL (ref 78.0–100.0)
Platelets: 283 10*3/uL (ref 150–400)
RBC: 3.95 MIL/uL (ref 3.87–5.11)
RDW: 15.1 % (ref 11.5–15.5)
WBC: 9.2 10*3/uL (ref 4.0–10.5)

## 2014-05-13 MED ORDER — INSULIN ASPART 100 UNIT/ML ~~LOC~~ SOLN
0.0000 [IU] | Freq: Three times a day (TID) | SUBCUTANEOUS | Status: DC
  Administered 2014-05-13: 11 [IU] via SUBCUTANEOUS
  Administered 2014-05-14: 3 [IU] via SUBCUTANEOUS
  Administered 2014-05-14: 7 [IU] via SUBCUTANEOUS

## 2014-05-13 MED ORDER — PREDNISONE 20 MG PO TABS
40.0000 mg | ORAL_TABLET | Freq: Every day | ORAL | Status: DC
  Administered 2014-05-13 – 2014-05-14 (×2): 40 mg via ORAL
  Filled 2014-05-13 (×3): qty 2

## 2014-05-13 MED ORDER — ALBUTEROL SULFATE (2.5 MG/3ML) 0.083% IN NEBU
2.5000 mg | INHALATION_SOLUTION | Freq: Three times a day (TID) | RESPIRATORY_TRACT | Status: DC
  Administered 2014-05-13 – 2014-05-14 (×4): 2.5 mg via RESPIRATORY_TRACT
  Filled 2014-05-13 (×4): qty 3

## 2014-05-13 MED ORDER — INSULIN ASPART 100 UNIT/ML ~~LOC~~ SOLN
0.0000 [IU] | Freq: Every day | SUBCUTANEOUS | Status: DC
  Administered 2014-05-13: 2 [IU] via SUBCUTANEOUS

## 2014-05-13 NOTE — Progress Notes (Signed)
PT id by med number, name and birth date  Scanner not working

## 2014-05-13 NOTE — Progress Notes (Signed)
PROGRESS NOTE  Alexis GivensChristine E Canche [id number] DOB: 01/20/1932 DOA: 05/12/2014 PCP: Marshia LyMICHAELS,CHASE A, PA-C  Alexis [name] is a 80 y.o. female with PMH significant for COPD on 2 L of oxygen at home when needed, Diastolic HF, HTN who presents complaining of SOB that got worse overnight. She also had episode of chest pain, middle of chest, pressure type, lasted for 30 minutes. When EMS arrived patient was started on BIPAP, nitroglycerin and and nebulizer treatment. Patient was wean off BIPAP in the ED. She is breathing better. Chest pain has resolved. She got over PNA recently. She was on antibiotics. She has not been taking her lasix as she should. Does not take it every day, it makes her pee. She has gain weight since last year.  Evaluation in the ED; chest x ray with CHF finding, BNP at 2482.   Assessment/Plan: Acute hypoxic Respiratory Failure;  Multifactorial secondary to Heart failure exacerbation primarily and component of COPD exacerbation.  Not complaint with lasix at home Admit to telemetry, cycle cardiac enzymes.  IV lasix 40 mg IV BID, adjust depending on renal function and volume status.  Nebulizer treatments,change to prednisone PO   Acute Diastolic Heart failure Exacerbation; Not compliant with medications.  IV lasix 40 mg IV BID.  Daily weight, strict I and o.  -down 1.6L  Diabetes; hold oral hypoglycemic agents. SSI.   Neuropathy; continue with gabapentin.   Acute COPD exacerbation; nebulizer , solumedrol.   Chest pain; cycle enzymes. Treat heart failure. Nitroglycerin PRN.   HTN; hold Norvasc for now, IV lasix.   Code Status: DNR Family Communication: patient Disposition Plan:    Consultants:    Procedures:      HPI/Subjective: Breathing better today  Objective: Filed Vitals:   05/13/14 0414  BP: 160/55  Pulse: 69  Temp: 98 F (36.7 C)  Resp: 21    Intake/Output Summary (Last 24 hours) at 05/13/14 0912 Last data filed  at 05/13/14 0148  Gross per 24 hour  Intake    360 ml  Output   2000 ml  Net  -1640 ml   Filed Weights   05/12/14 0341 05/12/14 0830 05/13/14 0414  Weight: 69.854 kg (154 lb) 70.9 kg (156 lb 4.9 oz) 68.765 kg (151 lb 9.6 oz)    Exam:   General:  A+Ox3, NAD  Cardiovascular: rrr  Respiratory: coarse breath sounds, no wheezing  Abdomen: +BS, soft  Musculoskeletal: no edema  Data Reviewed: Basic Metabolic Panel:  Recent Labs Lab 05/12/14 0350 05/13/14 0425  NA 141 139  K 4.3 4.5  CL 103 98  CO2 27 33*  GLUCOSE 174* 264*  BUN 16 30*  CREATININE 1.20* 1.42*  CALCIUM 9.3 9.2   Liver Function Tests: No results for input(s): AST, ALT, ALKPHOS, BILITOT, PROT, ALBUMIN in the last 168 hours. No results for input(s): LIPASE, AMYLASE in the last 168 hours. No results for input(s): AMMONIA in the last 168 hours. CBC:  Recent Labs Lab 05/12/14 0350 05/13/14 0425  WBC 9.6 9.2  HGB 11.1* 10.5*  HCT 35.4* 33.4*  MCV 84.1 84.6  PLT 293 283   Cardiac Enzymes:  Recent Labs Lab 05/12/14 0809 05/12/14 1359 05/12/14 1948  TROPONINI <0.30 <0.30 <0.30   BNP (last 3 results)  Recent Labs  06/10/13 0231 07/15/13 2013 05/12/14 0350  PROBNP 2374.0* 1598.0* 2482.0*   CBG:  Recent Labs Lab 05/12/14 0855 05/12/14 1133 05/12/14 1623 05/12/14 2058 05/13/14 0609  GLUCAP 165* 178* 308* 223* 223*  No results found for this or any previous visit (from the past 240 hour(s)).   Studies: Dg Chest Port 1 View  05/12/2014   CLINICAL DATA:  Chest pain and shortness of breath  EXAM: PORTABLE CHEST - 1 VIEW  COMPARISON:  07/15/2013  FINDINGS: There is cardiomegaly which is stable from prior. Negative aortic contours. Pulmonary hyperinflation which is chronic. There is interstitial coarsening with congested appearance of the hila. Suspect trace pleural effusions. Remote deformity of the left ribs.  IMPRESSION: Mild CHF.   Electronically Signed   By: Tiburcio Pea M.D.    On: 05/12/2014 04:26    Scheduled Meds: . albuterol  2.5 mg Nebulization TID  . aspirin  81 mg Oral QHS  . docusate sodium  100 mg Oral BID  . enoxaparin (LOVENOX) injection  40 mg Subcutaneous Q24H  . escitalopram  10 mg Oral Daily  . fenofibrate  160 mg Oral Daily  . furosemide  40 mg Intravenous Q12H  . gabapentin  100 mg Oral BID WC  . gabapentin  200 mg Oral QHS  . insulin aspart  0-9 Units Subcutaneous TID WC  . methylPREDNISolone (SOLU-MEDROL) injection  40 mg Intravenous Q12H  . metoprolol tartrate  12.5 mg Oral BID  . multivitamin with minerals  1 tablet Oral Daily  . sodium chloride  3 mL Intravenous Q12H  . 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 If 7PM-7AM, please contact night-coverage at [email or web address], password [facility] 05/13/2014, 9:12 AM  LOS: 1 day

## 2014-05-13 NOTE — Evaluation (Addendum)
Physical Therapy Evaluation Patient Details Name: Alexis [name] MRN: [id number] DOB: 09/28/1931 Today's Date: 05/13/2014   History of Present Illness  80 y.o. female with PMH significant for COPD on 2 L of oxygen at home, HTN, 5-6 falls in past year.  Active problems diastolic congestive heart failure, acute on chronic respiratory failure with hypercapnia, pulmonary edema.  Pt not taking Lasix as directed.    Clinical Impression  Patient is unsteady with mobility and admits to not using assistive devices as she should at home, resulting in several falls over the past year.  She is on 2L O2 at home (inconsistently) but SpO2 remained at least 95% throughout today's session on room air.  Educated pt on importance of using RW for mobility in and out of home environment to decrease risk of falls and to always have Life Alert device on her person when at home alone.  D[facility] for pt to decrease difficulty of using restroom during the night in hopes of increasing compliance with medications.  Given her significant balance deficits and inability to drive, recommend home health PT for balance training.  Pt would benefit from skilled therapy to address deficits in balance and functional mobility in order to increase patient safety and return to PLOF.    Follow Up Recommendations Home health PT    Equipment Recommendations  None recommended by PT    Recommendations for Other Services       Precautions / Restrictions Precautions Precautions: Fall Precaution Comments: Pt reports 5-6 falls in past year Restrictions Weight Bearing Restrictions: No      Mobility  Bed Mobility               General bed mobility comments: pt seated in chair on arrival and returned to chair at end of session  Transfers Overall transfer level: Needs assistance Equipment used: None Transfers: Sit to/from Stand Sit to Stand: Supervision         General  transfer comment: Supervision for patient safety; verbal cues for hand position and to control descent to seated  Ambulation/Gait Ambulation/Gait assistance: Min guard Ambulation Distance (Feet): 600 Feet (400 without RW, 200 with RW) Assistive device: Rolling walker (2 wheeled);None Gait Pattern/deviations: Step-through pattern;Decreased stride length;Narrow base of support Gait velocity: decreased, but faster with RW than without   General Gait Details: Pt unsteady when walking without RW but no LOB.  Min guard when ambulating without assistive device for patient safety and direction.  Verbal cues for posture and use of RW.  Stairs Stairs: Yes Stairs assistance: Min guard Stair Management: One rail Right;Alternating pattern;Forwards Number of Stairs: 2 General stair comments: Min guard for patient safety  Wheelchair Mobility    Modified Rankin (Stroke Patients Only)       Balance Overall balance assessment: Needs assistance;History of Falls Sitting-balance support: No upper extremity supported;Feet supported Sitting balance-Leahy Scale: Good     Standing balance support: No upper extremity supported;During functional activity Standing balance-Leahy Scale: Good               High level balance activites: Head turns;Other (comment) (Velocity changes) High Level Balance Comments: Pt with very diminished gait speed during head turns without RW; gait speed closer to normal during head turns with RW.  More difficulty with right-left head turns than up-down head turns.  Gait speed faster and pt  much more steady with use of RW  Pertinent Vitals/Pain Pain Assessment: No/denies pain  RHR: 64 HR during activity: 76 SpO2: 94-95% (RA)    Home Living Family/patient expects to be discharged to:: Private residence Living Arrangements: Children (son) Available Help at Discharge: Family;Available PRN/intermittently Type of Home: House Home Access: Stairs to  enter Entrance Stairs-Rails: None Entrance Stairs-Number of Steps: 1 Home Layout: Other (Comment) Home Equipment: Walker - 2 wheels;Cane - single point;Shower seat (Life-alert necklace)      Prior Function Level of Independence: Independent with assistive device(s)         Comments: Uses walker when out of the house, occasionally will use it inside the house as well     Hand Dominance        Extremity/Trunk Assessment   Upper Extremity Assessment: Overall WFL for tasks assessed           Lower Extremity Assessment: Overall WFL for tasks assessed      Cervical / Trunk Assessment: Kyphotic  Communication   Communication: HOH  Cognition Arousal/Alertness: Awake/alert Behavior During Therapy: WFL for tasks assessed/performed Overall Cognitive Status: Within Functional Limits for tasks assessed       Memory: Decreased short-term memory              General Comments      Exercises        Assessment/Plan    PT Assessment Patient needs continued PT services  PT Diagnosis Difficulty walking;Abnormality of gait   PT Problem List Decreased balance;Decreased mobility;Decreased safety awareness;Decreased knowledge of use of DME  PT Treatment Interventions DME instruction;Gait training;Stair training;Functional mobility training;Therapeutic activities;Balance training;Patient/family education   PT Goals (Current goals can be found in the Care Plan section) Acute Rehab PT Goals Patient Stated Goal: to go home PT Goal Formulation: With patient Time For Goal Achievement: 05/27/14 Potential to Achieve Goals: Good    Frequency Min 3X/week   Barriers to discharge Decreased caregiver support Lives with son who works during the day    Co-evaluation               End of Session   Activity Tolerance: Patient tolerated treatment well Patient left: in chair;with family/visitor present;with call bell/phone within reach           Time: 1115-1142 PT  Time Calculation (min) (ACUTE ONLY): 27 min   Charges:         PT G Codes:          Caress Reffitt SPT 05/13/2014, 12:03 PM

## 2014-05-13 NOTE — Progress Notes (Signed)
Pt. Refused cpap. 

## 2014-05-13 NOTE — Progress Notes (Signed)
PT id by med number and name and birth date

## 2014-05-14 DIAGNOSIS — J441 Chronic obstructive pulmonary disease with (acute) exacerbation: Secondary | ICD-10-CM | POA: Insufficient documentation

## 2014-05-14 DIAGNOSIS — E0849 Diabetes mellitus due to underlying condition with other diabetic neurological complication: Secondary | ICD-10-CM

## 2014-05-14 DIAGNOSIS — J810 Acute pulmonary edema: Secondary | ICD-10-CM

## 2014-05-14 LAB — GLUCOSE, CAPILLARY
GLUCOSE-CAPILLARY: 131 mg/dL — AB (ref 70–99)
GLUCOSE-CAPILLARY: 219 mg/dL — AB (ref 70–99)

## 2014-05-14 MED ORDER — ISOSORB DINITRATE-HYDRALAZINE 20-37.5 MG PO TABS
1.0000 | ORAL_TABLET | Freq: Two times a day (BID) | ORAL | Status: DC
  Administered 2014-05-14: 1 via ORAL
  Filled 2014-05-14 (×2): qty 1

## 2014-05-14 MED ORDER — FUROSEMIDE 40 MG PO TABS: 60.0000 mg | ORAL_TABLET | Freq: Every day | ORAL | Status: DC

## 2014-05-14 MED ORDER — ENOXAPARIN SODIUM 30 MG/0.3ML ~~LOC~~ SOLN: 30.0000 mg | SUBCUTANEOUS | Status: DC

## 2014-05-14 MED ORDER — PREDNISONE 20 MG PO TABS: ORAL_TABLET | ORAL | Status: DC

## 2014-05-14 MED ORDER — ISOSORB DINITRATE-HYDRALAZINE 20-37.5 MG PO TABS: 1.0000 | ORAL_TABLET | Freq: Two times a day (BID) | ORAL | Status: DC

## 2014-05-14 MED ORDER — METOPROLOL TARTRATE 12.5 MG HALF TABLET: 12.5000 mg | ORAL_TABLET | Freq: Two times a day (BID) | ORAL | Status: DC

## 2014-05-14 NOTE — Progress Notes (Signed)
Pt discharged per MD order and protocol. Discharge instructions reviewed with patient and all questions answered. Pt given all prescriptions and aware of follow up appointments.  

## 2014-05-14 NOTE — Discharge Summary (Signed)
Physician Discharge Summary  Alexis [name] PYP:[id number] DOB: Mar 30, 1932 DOA: 05/12/2014  PCP: Marshia Ly, PA-C  Admit date: 05/12/2014 Discharge date: 05/14/2014  Time spent: >30 minutes  Recommendations for Outpatient Follow-up:  Check BMET to follow electrolytes and renal function Arrange follow up with pulmonology for repeat PFT's and further rec's on COPD management Reassess BP and adjust medication as needed  Discharge Diagnoses:  Acute on chronic Diastolic congestive heart failure Acute respiratory failure with hypercapnia Pulmonary edema Diabetes mellitus due to underlying condition with neurologic manifestation   Discharge Condition: stable and improved. Will discharge home. Patient to follow with PCP in 10 days   Diet recommendation: low sodium  And low carb diet  Filed Weights   05/12/14 0830 05/13/14 0414 05/14/14 0523  Weight: 70.9 kg (156 lb 4.9 oz) 68.765 kg (151 lb 9.6 oz) 68.584 kg (151 lb 3.2 oz)    History of present illness:  80 y.o. female with PMH significant for COPD on 2 L of oxygen at home when needed, Diastolic HF, HTN who presents complaining of SOB that got worse overnight. She also had episode of chest pain, middle of chest, pressure type, lasted for 30 minutes. When EMS arrived patient was started on BIPAP, nitroglycerin and and nebulizer treatment. Patient was wean off BIPAP in the ED. She is breathing better. Chest pain has resolved. She got over PNA recently. She was on antibiotics. She has not been taking her lasix as she should. Does not take it every day, it makes her pee. She has gain weight since last year.  Evaluation in the ED; chest x ray with CHF finding, BNP at 2482.  Hospital Course:  Acute hypoxic Respiratory Failure;  -Multifactorial secondary to Heart failure exacerbation primarily and component of COPD exacerbation with bronchiectasis.  -Improved/resolved at discharge -not oxygen requirement at discharge -will treat  as mentioned below with lasix, steroids tapering, complete use of doxycycline and advise to follow low sodium diet  Acute on chronic Diastolic Heart failure  Not compliant with medications and/or low sodium diet.  Excellent response to IV diuretics -will discharge on lasix 60mg  daily ad advise to follow low sodium diet -patient to check weight on daily basis -will follow with PCP in 10 days.  Diabetes: will resume oral hypoglycemic regimen and patient will follow with PCP for further medication adjustments -advise to follow low carb diet  Neuropathy: continue with gabapentin  Acute COPD exacerbation and bronchiectasis -will discharge home with steroids tapering, antibiotics and PRN inhaler -patient will benefit of pulmonary visit and repeat PFT's as an outpatient  Chest pain; troponin negative.  -most likely secondary due to CHF exacerbation and COPD/bronchitis.  HTN: will discharge on metoprolol, lasix and bidil -patient advise to follow low sodium/heart healthy diet  Chronic renal function: essentially at baseline despite diuretic therapy  -close follow up now that patient is back on lasix  Procedures:  None   Most recent Echo on 05/2013 (vigorous systolic function; moderate LVH, diastolic heart failure mild)  Consultations:  None   Discharge Exam: Filed Vitals:   05/14/14 0814  BP: 172/51  Pulse: 54  Temp:   Resp:     General: afebrile, good air movement; no CP or SOB. Denies orthopnea. Cardiovascular: no JVD, no LE edema; S1 and S2, no rubs or gallops Respiratory: good air movement, no wheezing and no crackles Abd: soft, NT, ND, positive BS Neuro: no focal deficit.   Discharge Instructions You were cared for by a hospitalist during your hospital  stay. If you have any questions about your discharge medications or the care you received while you were in the hospital after you are discharged, you can call the unit and asked to speak with the hospitalist on call  if the hospitalist that took care of you is not available. Once you are discharged, your primary care physician will handle any further medical issues. Please note that NO REFILLS for any discharge medications will be authorized once you are discharged, as it is imperative that you return to your primary care physician (or establish a relationship with a primary care physician if you do not have one) for your aftercare needs so that they can reassess your need for medications and monitor your lab values.  Discharge Instructions    Diet - low sodium heart healthy    Complete by:  As directed      Discharge instructions    Complete by:  As directed   Take medications as prescribed Follow a low sodium diet (less than 2 grams per day) Check your weight on daily basis (red flag and need to contact PCP if you gained > 3 pounds overnight and/or > 5 pounds in a week) Arrange follow up with PCP in 10 days          Current Discharge Medication List    START taking these medications   Details  isosorbide-hydrALAZINE (BIDIL) 20-37.5 MG per tablet Take 1 tablet by mouth 2 (two) times daily. Qty: 60 tablet, Refills: 2    metoprolol tartrate (LOPRESSOR) 12.5 mg TABS tablet Take 0.5 tablets (12.5 mg total) by mouth 2 (two) times daily. Qty: 60 tablet, Refills: 2    predniSONE (DELTASONE) 20 MG tablet Take 2 tablets by mouth daily X 1 day; then start taking 1 tablet by mouth daily X3 days; then 1/2 tablet by mouth daily X 3 days and stop prednisone. Qty: 8 tablet, Refills: 0      CONTINUE these medications which have CHANGED   Details  furosemide (LASIX) 40 MG tablet Take 1.5 tablets (60 mg total) by mouth daily. Qty: 45 tablet, Refills: 2      CONTINUE these medications which have NOT CHANGED   Details  acetaminophen (TYLENOL) 500 MG tablet Take 500 mg by mouth every 6 (six) hours as needed for moderate pain or headache.    albuterol (PROVENTIL HFA;VENTOLIN HFA) 108 (90 BASE) MCG/ACT inhaler  Inhale 2 puffs into the lungs every 6 (six) hours as needed for wheezing or shortness of breath. Qty: 1 Inhaler, Refills: 3    aspirin EC 81 MG tablet Take 81 mg by mouth at bedtime.    Chlorpheniramine-DM (CORICIDIN COUGH/COLD) 4-30 MG TABS Take 1 tablet by mouth every 6 (six) hours as needed (for cough and cold symptoms).    cholecalciferol (VITAMIN D) 1000 UNITS tablet Take 1,000 Units by mouth at bedtime.     dextromethorphan-guaiFENesin (TUSSIN DM CLEAR) 10-100 MG/5ML liquid Take 10 mLs by mouth every 4 (four) hours as needed for cough.    diphenhydrAMINE (BENADRYL) 25 mg capsule Take 25 mg by mouth every 6 (six) hours as needed for allergies.    escitalopram (LEXAPRO) 10 MG tablet Take 10 mg by mouth daily.    fenofibrate 160 MG tablet Take 160 mg by mouth daily.    gabapentin (NEURONTIN) 100 MG capsule Take 100-200 mg by mouth 3 (three) times daily. Take 1 capsule (100 mg) morning and mid day, take 2 capsules (200 mg) at bedtime    glyBURIDE (  DIABETA) 5 MG tablet Take 5 mg by mouth daily with breakfast.    Inulin (FIBER CHOICE) 1.5 G CHEW Chew 1-2 tablets by mouth daily as needed (for constipation).    meclizine (ANTIVERT) 25 MG tablet Take 25 mg by mouth 3 (three) times daily as needed for dizziness.    Multiple Vitamin (MULTIVITAMIN WITH MINERALS) TABS tablet Take 1 tablet by mouth daily.    Multiple Vitamins-Minerals (ICAPS PO) Take 1 capsule by mouth daily.    sitaGLIPtin (JANUVIA) 50 MG tablet Take 50 mg by mouth at bedtime.    terbinafine (LAMISIL) 250 MG tablet Take 250 mg by mouth daily.    doxycycline (VIBRAMYCIN) 100 MG capsule Take 1 capsule by mouth 2 (two) times daily. Refills: 0      STOP taking these medications     amLODipine (NORVASC) 10 MG tablet      guaiFENesin (MUCINEX) 600 MG 12 hr tablet        Allergies  Allergen Reactions  . Other     novocaine broke mouth out  . Codeine Hives  . Penicillins Hives  . Procaine Hives  . Yellow Dyes  (Non-Tartrazine) Other (See Comments)    "deathly allergic" No other information given to explain reaction.   Follow-up Information    Follow up with MICHAELS,CHASE A, PA-C. Schedule an appointment as soon as possible for a visit in 10 days.   Specialty:  General Practice   Contact information:   [address] [place] [address] [phone]       The results of significant diagnostics from this hospitalization (including imaging, microbiology, ancillary and laboratory) are listed below for reference.    Significant Diagnostic Studies: Dg Chest Port 1 View  05/12/2014   CLINICAL DATA:  Chest pain and shortness of breath  EXAM: PORTABLE CHEST - 1 VIEW  COMPARISON:  07/15/2013  FINDINGS: There is cardiomegaly which is stable from prior. Negative aortic contours. Pulmonary hyperinflation which is chronic. There is interstitial coarsening with congested appearance of the hila. Suspect trace pleural effusions. Remote deformity of the left ribs.  IMPRESSION: Mild CHF.   Electronically Signed   By: Tiburcio PeaJonathan  Watts M.D.   On: 05/12/2014 04:26   Labs: Basic Metabolic Panel:  Recent Labs Lab 05/12/14 0350 05/13/14 0425  NA 141 139  K 4.3 4.5  CL 103 98  CO2 27 33*  GLUCOSE 174* 264*  BUN 16 30*  CREATININE 1.20* 1.42*  CALCIUM 9.3 9.2   CBC:  Recent Labs Lab 05/12/14 0350 05/13/14 0425  WBC 9.6 9.2  HGB 11.1* 10.5*  HCT 35.4* 33.4*  MCV 84.1 84.6  PLT 293 283   Cardiac Enzymes:  Recent Labs Lab 05/12/14 0809 05/12/14 1359 05/12/14 1948  TROPONINI <0.30 <0.30 <0.30   BNP: BNP (last 3 results)  Recent Labs  06/10/13 0231 07/15/13 2013 05/12/14 0350  PROBNP 2374.0* 1598.0* 2482.0*   CBG:  Recent Labs Lab 05/13/14 1143 05/13/14 1647 05/13/14 2126 05/14/14 0630 05/14/14 1129  GLUCAP 116* 279* 238* 131* 219*   Signed:  Vassie LollMadera, Aubrie Lucien  [facility] 05/14/2014, 12:40 PM

## 2014-07-12 ENCOUNTER — Encounter: Payer: Self-pay | Admitting: Internal Medicine

## 2014-07-12 ENCOUNTER — Ambulatory Visit (INDEPENDENT_AMBULATORY_CARE_PROVIDER_SITE_OTHER): Payer: Medicare Other | Admitting: Internal Medicine

## 2014-07-12 VITALS — BP 146/56 | HR 72 | Ht 62.0 in | Wt 158.6 lb

## 2014-07-12 DIAGNOSIS — I10 Essential (primary) hypertension: Secondary | ICD-10-CM | POA: Diagnosis not present

## 2014-07-12 DIAGNOSIS — R0902 Hypoxemia: Secondary | ICD-10-CM | POA: Diagnosis not present

## 2014-07-12 DIAGNOSIS — J449 Chronic obstructive pulmonary disease, unspecified: Secondary | ICD-10-CM | POA: Insufficient documentation

## 2014-07-12 DIAGNOSIS — J438 Other emphysema: Secondary | ICD-10-CM

## 2014-07-12 DIAGNOSIS — I5032 Chronic diastolic (congestive) heart failure: Secondary | ICD-10-CM

## 2014-07-12 NOTE — Patient Instructions (Signed)
Your physician wants you to follow-up in: 3 months with Dr. Hilty. You will receive a reminder letter in the mail two months in advance. If you don't receive a letter, please call our office to schedule the follow-up appointment.  

## 2014-07-12 NOTE — Progress Notes (Signed)
OFFICE NOTE  Chief Complaint:  Hospital follow-up  Primary Care Physician: Teena Irani, PA-C  HPI:  Alexis [name] is a pleasant 81 year old female who was recently seen at any and for heart failure exacerbation. She presented at this couple of weeks of increasing shortness of breath, cough, increasing abdominal girth and lower extremity edema. On admission her BNP was elevated and she responded fairly quickly to diuresis. This caused improvement in her breathing. She was noted to be hypertensive and continues to be hypertensive today. She has had some acute on chronic renal insufficiency.  She reports that when she was discharged she was sent home on oxygen, but was never told why she needed it. She followed up with her primary care provider, who is with [facility] and he recommended that she see a cardiologist to help determine whether she needs to continue to take oxygen. She apparently sought out our group and on seeing her today.  Her past medical history is significant for hypertension, diabetes, dyslipidemia and family history of heart disease.  She reports that since discharge her breathing is better, however when she does marked exertion she does feel short of breath. She is not using her oxygen. She denies any chest pain.  An echocardiogram performed at [facility] showed an EF of 65-70%, with moderate concentric LVH, and no wall motion abnormalities.  She's never had a stress test evaluation.  Alexis [name] returns today for followup of her stress test. This is a nuclear stress test that showed no reversible ischemia and a preserved EF greater than 70%. She reports that she's been undergoing rehabilitation at home and feels that her shortness of breath is improving. It is now a point where she does not feel that she needs oxygen. We went ahead and ambulated her around the office today and her baseline SpO2 was 98% and decreased to 95% with exertion. This is markedly  improved from her previous office visit where her oxygen saturation dropped to 74% on room air.  Also at her last office visit I started her on amlodipine 10 mg daily and this helped both lower blood pressure and I suspect will ask her pulmonary artery pressures as well.  I saw Alexis [name] back today from her recent hospitalization. She was also due for annual follow-up. She was recently admitted for what sounds like a diastolic heart failure exacerbation, possibly precipitated by pneumonia and there was clear medication noncompliance. She was not taking her Lasix as prescribed. Also there may been some dietary noncompliance. She had about 6-8 pounds of weight gain which was diuresed down to 151 pounds on discharge. She is currently weighing 156 pounds at home and waited at 158 pounds in the office. She reports no significant shortness of breath and has been "religious" about taking her Lasix on a daily basis. She says she is eating more than she had before she is currently living with her daughter.  PMHx:  Past Medical History  Diagnosis Date  . Hypertension   . Neuropathy   . Anxiety   . PONV (postoperative nausea and vomiting)   . CHF (congestive heart failure)   . COPD (chronic obstructive pulmonary disease)   . Family history of adverse reaction to anesthesia   . Shortness of breath dyspnea   . Cardiomyopathy   . Diabetes mellitus without complication     TYPE 2    Past Surgical History  Procedure Laterality Date  . Hip fracture surgery    .  Abdominal hysterectomy    . Vein surgery      FAMHx:  No family history on file.  SOCHx:   reports that she has never smoked. She has never used smokeless tobacco. She reports that she does not drink alcohol or use illicit drugs.  ALLERGIES:  Allergies  Allergen Reactions  . Other     novocaine broke mouth out  . Codeine Hives  . Penicillins Hives  . Procaine Hives  . Yellow Dyes (Non-Tartrazine) Other (See Comments)    "deathly  allergic" No other information given to explain reaction.    ROS: A comprehensive review of systems was negative.  HOME MEDS: Current Outpatient Prescriptions  Medication Sig Dispense Refill  . acetaminophen (TYLENOL) 500 MG tablet Take 500 mg by mouth every 6 (six) hours as needed for moderate pain or headache.    . albuterol (PROVENTIL HFA;VENTOLIN HFA) 108 (90 BASE) MCG/ACT inhaler Inhale 2 puffs into the lungs every 6 (six) hours as needed for wheezing or shortness of breath. 1 Inhaler 3  . amLODipine (NORVASC) 10 MG tablet Take 10 mg by mouth daily.    Marland Kitchen aspirin EC 81 MG tablet Take 81 mg by mouth at bedtime.    Marland Kitchen atorvastatin (LIPITOR) 10 MG tablet Take 1 tablet by mouth at bedtime.    . cholecalciferol (VITAMIN D) 1000 UNITS tablet Take 1,000 Units by mouth at bedtime.     Marland Kitchen dextromethorphan-guaiFENesin (TUSSIN DM CLEAR) 10-100 MG/5ML liquid Take 10 mLs by mouth every 4 (four) hours as needed for cough.    . diphenhydrAMINE (BENADRYL) 25 mg capsule Take 25 mg by mouth every 6 (six) hours as needed for allergies.    Marland Kitchen escitalopram (LEXAPRO) 10 MG tablet Take 10 mg by mouth daily.    . fenofibrate 160 MG tablet Take 160 mg by mouth daily.    . furosemide (LASIX) 40 MG tablet Take 1.5 tablets (60 mg total) by mouth daily. 45 tablet 2  . gabapentin (NEURONTIN) 100 MG capsule Take 100-200 mg by mouth 3 (three) times daily. Take 1 capsule (100 mg) morning and mid day, take 2 capsules (200 mg) at bedtime    . glyBURIDE (DIABETA) 5 MG tablet Take 5 mg by mouth daily with breakfast.    . hydrALAZINE (APRESOLINE) 25 MG tablet Take 25 mg by mouth 2 (two) times daily.    . Inulin (FIBER CHOICE) 1.5 G CHEW Chew 1-2 tablets by mouth daily as needed (for constipation).    . isosorbide mononitrate (IMDUR) 30 MG 24 hr tablet Take 30 mg by mouth daily.    . meclizine (ANTIVERT) 25 MG tablet Take 25 mg by mouth 3 (three) times daily as needed for dizziness.    . metoprolol tartrate (LOPRESSOR) 12.5 mg  TABS tablet Take 0.5 tablets (12.5 mg total) by mouth 2 (two) times daily. 60 tablet 2  . Multiple Vitamin (MULTIVITAMIN WITH MINERALS) TABS tablet Take 1 tablet by mouth daily.    . Multiple Vitamins-Minerals (ICAPS PO) Take 1 capsule by mouth daily.     No current facility-administered medications for this visit.    LABS/IMAGING: No results found for this or any previous visit (from the past 48 hour(s)). No results found.  VITALS: BP 146/56 mmHg  Pulse 72  Ht  (1.575 m)  Wt 158 lb 9.6 oz (71.94 kg)  BMI 29.00 kg/m2  EXAM: General appearance: alert and no distress Neck: no carotid bruit and no JVD Lungs: diminished breath sounds bibasilar Heart: regular  rate and rhythm, S1, S2 normal, no murmur, click, rub or gallop Abdomen: soft, non-tender; bowel sounds normal; no masses,  no organomegaly Extremities: extremities normal, atraumatic, no cyanosis or edema Pulses: 2+ and symmetric Skin: Skin color, texture, turgor normal. No rashes or lesions Neurologic: Grossly normal Psych: Mood, affect normal  EKG: Deferred  ASSESSMENT: 1. Recent acute diastolic heart failure exacerbation 2. Hypertensive heart disease with moderate LVH 3. Hypertension - controlled 4. Dyslipidemia 5. Diabetes type 2 6. Family history of heart disease  PLAN: 1.   Mrs. Condor is improved today with regards to her hypoxia and shortness of breath. She was noncompliant with her diuretics and is now taking them more regularly. Her dose is been increased to 60 mg a day. She appears euvolemic on exam and weight is been fairly stable although she is needing more the past couple months. Blood pressure is also well controlled today. We will continue her current medications plan to see her back in 3 months per her request.  Chrystie Nose, MD, [facility] Attending Cardiologist [facility]  Carsten Carstarphen C 07/12/2014, 1:11 PM

## 2014-10-03 ENCOUNTER — Telehealth: Payer: Self-pay | Admitting: Internal Medicine

## 2014-10-03 MED ORDER — HYDRALAZINE HCL 25 MG PO TABS: 25.0000 mg | ORAL_TABLET | Freq: Two times a day (BID) | ORAL | Status: DC

## 2014-10-03 MED ORDER — ISOSORBIDE MONONITRATE ER 30 MG PO TB24: 30.0000 mg | ORAL_TABLET | Freq: Every day | ORAL | Status: DC

## 2014-10-03 NOTE — Telephone Encounter (Signed)
°  1. Which medications need to be refilled? Hydralazine( BID) and Isosorbide  2. Which pharmacy is medication to be sent to?[facility] on [address]  3. Do they need a 30 day or 90 day supply? 30  4. Would they like a call back once the medication has been sent to the pharmacy? Yes

## 2014-10-03 NOTE — Telephone Encounter (Signed)
Refill submitted to patient's preferred pharmacy. Informed patient. Pt voiced understanding, no other stated concerns at this time.  

## 2014-10-04 ENCOUNTER — Other Ambulatory Visit: Payer: Self-pay | Admitting: Internal Medicine

## 2014-10-04 NOTE — Telephone Encounter (Signed)
Rx(s) sent to pharmacy electronically.  

## 2014-10-13 ENCOUNTER — Ambulatory Visit: Payer: Medicare Other | Admitting: Internal Medicine

## 2014-11-03 ENCOUNTER — Encounter (HOSPITAL_COMMUNITY): Payer: Self-pay | Admitting: *Deleted

## 2014-11-03 ENCOUNTER — Emergency Department (HOSPITAL_COMMUNITY): Payer: Medicare Other

## 2014-11-03 ENCOUNTER — Emergency Department (HOSPITAL_COMMUNITY)
Admission: EM | Admit: 2014-11-03 | Discharge: 2014-11-04 | Disposition: A | Discharge status: Home / Self Care | Payer: Medicare Other | Attending: Emergency Medicine | Admitting: Emergency Medicine

## 2014-11-03 DIAGNOSIS — J441 Chronic obstructive pulmonary disease with (acute) exacerbation: Secondary | ICD-10-CM | POA: Insufficient documentation

## 2014-11-03 DIAGNOSIS — Z880 Allergy status to penicillin: Secondary | ICD-10-CM | POA: Diagnosis not present

## 2014-11-03 DIAGNOSIS — R0602 Shortness of breath: Secondary | ICD-10-CM

## 2014-11-03 DIAGNOSIS — Z8669 Personal history of other diseases of the nervous system and sense organs: Secondary | ICD-10-CM | POA: Insufficient documentation

## 2014-11-03 DIAGNOSIS — F419 Anxiety disorder, unspecified: Secondary | ICD-10-CM | POA: Diagnosis not present

## 2014-11-03 DIAGNOSIS — E119 Type 2 diabetes mellitus without complications: Secondary | ICD-10-CM | POA: Diagnosis not present

## 2014-11-03 DIAGNOSIS — I509 Heart failure, unspecified: Secondary | ICD-10-CM | POA: Insufficient documentation

## 2014-11-03 DIAGNOSIS — I10 Essential (primary) hypertension: Secondary | ICD-10-CM | POA: Insufficient documentation

## 2014-11-03 DIAGNOSIS — Z9981 Dependence on supplemental oxygen: Secondary | ICD-10-CM | POA: Insufficient documentation

## 2014-11-03 DIAGNOSIS — R140 Abdominal distension (gaseous): Secondary | ICD-10-CM | POA: Insufficient documentation

## 2014-11-03 DIAGNOSIS — Z79899 Other long term (current) drug therapy: Secondary | ICD-10-CM | POA: Diagnosis not present

## 2014-11-03 LAB — COMPREHENSIVE METABOLIC PANEL
ALT: 20 U/L (ref 14–54)
AST: 18 U/L (ref 15–41)
Albumin: 3.3 g/dL — ABNORMAL LOW (ref 3.5–5.0)
Alkaline Phosphatase: 129 U/L — ABNORMAL HIGH (ref 38–126)
Anion gap: 13 (ref 5–15)
BUN: 42 mg/dL — ABNORMAL HIGH (ref 6–20)
CO2: 24 mmol/L (ref 22–32)
Calcium: 8.7 mg/dL — ABNORMAL LOW (ref 8.9–10.3)
Chloride: 103 mmol/L (ref 101–111)
Creatinine, Ser: 1.75 mg/dL — ABNORMAL HIGH (ref 0.44–1.00)
GFR calc Af Amer: 31 mL/min — ABNORMAL LOW (ref >60)
GFR calc non Af Amer: 26 mL/min — ABNORMAL LOW (ref >60)
Glucose, Bld: 271 mg/dL — ABNORMAL HIGH (ref 65–99)
Potassium: 4.2 mmol/L (ref 3.5–5.1)
Sodium: 140 mmol/L (ref 135–145)
Total Bilirubin: 0.5 mg/dL (ref 0.3–1.2)
Total Protein: 6.7 g/dL (ref 6.5–8.1)

## 2014-11-03 LAB — I-STAT TROPONIN, ED: Troponin i, poc: 0.01 ng/mL (ref 0.00–0.08)

## 2014-11-03 LAB — BRAIN NATRIURETIC PEPTIDE: B Natriuretic Peptide: 466.7 pg/mL — ABNORMAL HIGH (ref 0.0–100.0)

## 2014-11-03 MED ORDER — METHYLPREDNISOLONE SODIUM SUCC 125 MG IJ SOLR
125.0000 mg | Freq: Once | INTRAMUSCULAR | Status: AC
  Administered 2014-11-03: 125 mg via INTRAVENOUS
  Filled 2014-11-03: qty 2

## 2014-11-03 MED ORDER — FUROSEMIDE 10 MG/ML IJ SOLN
60.0000 mg | Freq: Once | INTRAMUSCULAR | Status: AC
  Administered 2014-11-03: 60 mg via INTRAVENOUS
  Filled 2014-11-03: qty 6

## 2014-11-03 MED ORDER — ALBUTEROL SULFATE (2.5 MG/3ML) 0.083% IN NEBU
5.0000 mg | INHALATION_SOLUTION | Freq: Once | RESPIRATORY_TRACT | Status: AC
  Administered 2014-11-03: 5 mg via RESPIRATORY_TRACT
  Filled 2014-11-03: qty 6

## 2014-11-03 NOTE — ED Provider Notes (Signed)
CSN: [id number]     Arrival date & time 11/03/14  1830 History   First MD Initiated Contact with Patient 11/03/14 1859     Chief Complaint  Patient presents with  . Shortness of Breath     (Consider location/radiation/quality/duration/timing/severity/associated sxs/prior Treatment) HPI Alexis [name] is an 81 year old female with past medical history of CHF, COPD, hypertension, diabetes, heart or myopathy who presents the ER complaining of shortness of breath. Patient reports over the past three days she has experienced worsening of SOB from her baseline.  Pt states her s/s feel consistent with previous fluid buildup on her lungs.  Pt reports associated cough x 3 days as well.  Pt denies chest pain, HA, dizziness, weakness, palpitations, fever, nausea, vomiting, abdominal pain.  Past Medical History  Diagnosis Date  . Hypertension   . Neuropathy   . Anxiety   . PONV (postoperative nausea and vomiting)   . CHF (congestive heart failure)   . COPD (chronic obstructive pulmonary disease)   . Family history of adverse reaction to anesthesia   . Shortness of breath dyspnea   . Cardiomyopathy   . Diabetes mellitus without complication     TYPE 2   Past Surgical History  Procedure Laterality Date  . Hip fracture surgery    . Abdominal hysterectomy    . Vein surgery     No family history on file. History  Substance Use Topics  . Smoking status: Never Smoker   . Smokeless tobacco: Never Used  . Alcohol Use: No   OB History    No data available     Review of Systems  Constitutional: Negative for fever.  HENT: Negative for trouble swallowing.   Eyes: Negative for visual disturbance.  Respiratory: Positive for shortness of breath and wheezing.   Cardiovascular: Negative for chest pain.  Gastrointestinal: Negative for nausea, vomiting and abdominal pain.  Genitourinary: Negative for dysuria.  Musculoskeletal: Negative for neck pain.  Skin: Negative for rash.  Neurological:  Negative for dizziness, weakness and numbness.  Psychiatric/Behavioral: Negative.     Allergies  Other; Codeine; Penicillins; Procaine; and Yellow dyes (non-tartrazine)  Home Medications   Prior to Admission medications   Medication Sig Start Date End Date Taking? Authorizing Provider  acetaminophen (TYLENOL) 325 MG tablet Take 650 mg by mouth every 6 (six) hours as needed for mild pain.    Yes Historical Provider, MD  acetaminophen (TYLENOL) 500 MG tablet Take 500 mg by mouth every 6 (six) hours as needed for moderate pain or headache.   Yes Historical Provider, MD  albuterol (PROVENTIL HFA;VENTOLIN HFA) 108 (90 BASE) MCG/ACT inhaler Inhale 2 puffs into the lungs every 6 (six) hours as needed for wheezing or shortness of breath. 06/11/13  Yes Nishant Dhungel, MD  amLODipine (NORVASC) 10 MG tablet Take 10 mg by mouth daily.   Yes Historical Provider, MD  atorvastatin (LIPITOR) 20 MG tablet Take 20 mg by mouth at bedtime. 10/12/14 10/12/15 Yes Historical Provider, MD  cholecalciferol (VITAMIN D) 1000 UNITS tablet Take 1,000 Units by mouth at bedtime.    Yes Historical Provider, MD  diphenhydrAMINE (BENADRYL) 25 mg capsule Take 25 mg by mouth daily as needed for allergies.    Yes Historical Provider, MD  escitalopram (LEXAPRO) 10 MG tablet Take 10 mg by mouth daily.   Yes Historical Provider, MD  fenofibrate 160 MG tablet Take 160 mg by mouth daily.   Yes Historical Provider, MD  furosemide (LASIX) 40 MG tablet Take 1.5 tablets (60 mg  total) by mouth daily. 05/14/14  Yes Vassie Loll, MD  gabapentin (NEURONTIN) 100 MG capsule Take 100-200 mg by mouth 2 (two) times daily. Take 1 capsule (100 mg) morning, take 2 capsules (200 mg) at bedtime   Yes Historical Provider, MD  glipiZIDE (GLUCOTROL XL) 10 MG 24 hr tablet Take 10 mg by mouth daily. 10/11/14  Yes Historical Provider, MD  hydrALAZINE (APRESOLINE) 25 MG tablet Take 1 tablet (25 mg total) by mouth 2 (two) times daily. 10/03/14  Yes Chrystie Nose, MD  isosorbide mononitrate (IMDUR) 30 MG 24 hr tablet Take 1 tablet (30 mg total) by mouth daily. 10/03/14  Yes Chrystie Nose, MD  metoprolol tartrate (LOPRESSOR) 12.5 mg TABS tablet Take 0.5 tablets (12.5 mg total) by mouth 2 (two) times daily. 05/14/14  Yes Vassie Loll, MD  Multiple Vitamin (MULTIVITAMIN WITH MINERALS) TABS tablet Take 1 tablet by mouth daily.   Yes Historical Provider, MD  Pramoxine-Menthol (GOLD BOND MAXIMUM RELIEF) 1-1 % CREA Apply 1 application topically 2 (two) times daily as needed (for would care).   Yes Historical Provider, MD  aspirin EC 81 MG tablet Take 81 mg by mouth at bedtime.    Historical Provider, MD  furosemide (LASIX) 20 MG tablet Take 3 tablets (60 mg total) by mouth daily. In addition to your prescribed 60 mg daily. Total dose will be 120 mg daily for the next 3 days. 11/04/14   Ladona Mow, PA-C  metoprolol tartrate (LOPRESSOR) 25 MG tablet TAKE HALF OF A TABLET BY MOUTH 2 TIMES A DAY 10/04/14   Chrystie Nose, MD  predniSONE (DELTASONE) 20 MG tablet Take 1 tablet (20 mg total) by mouth daily. 11/04/14   Ladona Mow, PA-C   BP 155/48 mmHg  Pulse 69  Temp(Src) 98.3 F (36.8 C) (Oral)  Resp 17  Ht  (1.575 m)  Wt 173 lb (78.472 kg)  BMI 31.63 kg/m2  SpO2 95% Physical Exam  Constitutional: She is oriented to person, place, and time. She appears well-developed and well-nourished. No distress.  HENT:  Head: Normocephalic and atraumatic.  Mouth/Throat: Oropharynx is clear and moist. No oropharyngeal exudate.  Eyes: Right eye exhibits no discharge. Left eye exhibits no discharge. No scleral icterus.  Neck: Normal range of motion.  Cardiovascular: Normal rate, regular rhythm and normal heart sounds.   No murmur heard. Pulmonary/Chest: Accessory muscle usage present. Tachypnea noted. No respiratory distress. She has wheezes in the right upper field, the right middle field, the right lower field, the left upper field, the left middle field and the  left lower field. She has rhonchi in the right lower field and the left lower field.  Patient with mild tachypnea in low 20s. Mild respiratory distress noted, patient able to speak in 5-6 word sentences.  Abdominal: Soft. Normal appearance and bowel sounds are normal. She exhibits distension. There is no tenderness. There is no rigidity, no guarding, no tenderness at McBurney's point and negative Murphy's sign.  Mild distension without tenderness noted.   Musculoskeletal: Normal range of motion. She exhibits no edema or tenderness.  Lymphadenopathy:  2+ pedal edema noted bilaterally. DP 2+ bilaterally.    Neurological: She is alert and oriented to person, place, and time. No cranial nerve deficit. Coordination normal.  Skin: Skin is warm and dry. No rash noted. She is not diaphoretic.  Psychiatric: She has a normal mood and affect.  Nursing note and vitals reviewed.   ED Course  Procedures (including critical care time) Labs  Review Labs Reviewed  BRAIN NATRIURETIC PEPTIDE - Abnormal; Notable for the following:    B Natriuretic Peptide 466.7 (*)    All other components within normal limits  COMPREHENSIVE METABOLIC PANEL - Abnormal; Notable for the following:    Glucose, Bld 271 (*)    BUN 42 (*)    Creatinine, Ser 1.75 (*)    Calcium 8.7 (*)    Albumin 3.3 (*)    Alkaline Phosphatase 129 (*)    GFR calc non Af Amer 26 (*)    GFR calc Af Amer 31 (*)    All other components within normal limits  Rosezena Sensor, ED    Imaging Review Dg Chest Port 1 View  11/03/2014   CLINICAL DATA:  Short of breath. Symptoms for 3 days. Initial encounter. Diabetes.  EXAM: PORTABLE CHEST - 1 VIEW  COMPARISON:  05/12/2014.  FINDINGS: Apical lordotic projection. Cardiomegaly. Pulmonary vascular congestion is present with interstitial pulmonary edema. There is increased opacity over the LEFT lateral chest, which is favored to be due to overlapping soft tissues and breast shadow combined with interstitial  edema and atelectasis. Focal airspace disease such as pneumonia could also produce this appearance but is considered less likely. Monitoring leads project over the chest.  IMPRESSION: Combination of findings compatible with mild CHF.   Electronically Signed   By: Andreas Newport M.D.   On: 11/03/2014 19:38     EKG Interpretation None      MDM   Final diagnoses:  SOB (shortness of breath)  COPD exacerbation   3 days worsening shortness of breath from baseline. Patient states she's gotten the point where she is unable to walk to her bathroom from her bedroom without getting short of breath. Patient wears oxygen when necessary at home. On 2 L of oxygen initially patient is noted to have O2 saturation of 91%. Diffuse wheezes noted on exam initially. Patient presentation consistent with possible COPD exacerbation, the patient does have edema in extremities bilaterally and mild edema in her abdomen consistent with being fluid overloaded. Patient treated with breathing treatments here and IV Lasix. EKG unremarkable for acute pathology. Labs appear at baseline for patient. Chest radiographs with evidence of findings consistent with mild pulmonary edema/CHF. Signs and symptoms improved dramatically, patient stating she is "feeling completely better". On reexamination of patient's lungs, she is moving air well, wheezes have diminished significantly. There is some mild crackles noted bibasilar. Oxygen saturations returned into the high 90s. Reading previous notes, it appears there has been some issues with compliance patient's Lasix, speaking in a patient again regarding this, she states she "does not always take her medicine". With improvement of symptoms here, patient is requesting to go home. Patient is afebrile, hemodynamically stable, non-hypoxic on her regular oxygen and in no acute distress. Patient's presentation tonight could possibly reflect worsening CHF, however with concern that patient may not be  compliant with Lasix therapy, she may have become fluid overloaded. Patient be discharged to increase Lasix dosage to 120 mg daily for the next 3 days, then to decrease to 60 mg per day. Patient encouraged to follow-up with PCP. Return precautions discussed, patient verbalizes understanding and agreement of this plan.  BP 155/48 mmHg  Pulse 69  Temp(Src) 98.3 F (36.8 C) (Oral)  Resp 17  Ht 5\' 2"  (1.575 m)  Wt 173 lb (78.472 kg)  BMI 31.63 kg/m2  SpO2 95%  Signed,  Ladona Mow, PA-C 1:27 AM  Patient seen and discussed with Dr. Raeford Razor,  MD    Ladona Mow, PA-C 11/04/14 0128  Ladona Mow, PA-C 11/04/14 0130  Raeford Razor, MD 11/05/14 2250

## 2014-11-03 NOTE — ED Notes (Signed)
Pt with increased sob, LE swelling and abdominal swelling x 3 days.  Sats of 91% on RA.  Pt is wearing her O2 she normally wears at night.  Breathing labored.

## 2014-11-04 MED ORDER — FUROSEMIDE 20 MG PO TABS: 60.0000 mg | ORAL_TABLET | Freq: Every day | ORAL | Status: DC

## 2014-11-04 MED ORDER — PREDNISONE 20 MG PO TABS: 20.0000 mg | ORAL_TABLET | Freq: Every day | ORAL | Status: DC

## 2014-11-04 NOTE — Discharge Instructions (Signed)
Double your Lasix dosage for the next 3 days. Follow-up with your doctor, call tomorrow. Return to the ER if any worsening of symptoms, severe shortness of breath.   Chronic Obstructive Pulmonary Disease Chronic obstructive pulmonary disease (COPD) is a common lung condition in which airflow from the lungs is limited. COPD is a general term that can be used to describe many different lung problems that limit airflow, including both chronic bronchitis and emphysema. If you have COPD, your lung function will probably never return to normal, but there are measures you can take to improve lung function and make yourself feel better.  CAUSES   Smoking (common).   Exposure to secondhand smoke.   Genetic problems.  Chronic inflammatory lung diseases or recurrent infections. SYMPTOMS   Shortness of breath, especially with physical activity.   Deep, persistent (chronic) cough with a large amount of thick mucus.   Wheezing.   Rapid breaths (tachypnea).   Gray or bluish discoloration (cyanosis) of the skin, especially in fingers, toes, or lips.   Fatigue.   Weight loss.   Frequent infections or episodes when breathing symptoms become much worse (exacerbations).   Chest tightness. DIAGNOSIS  Your health care provider will take a medical history and perform a physical examination to make the initial diagnosis. Additional tests for COPD may include:   Lung (pulmonary) function tests.  Chest X-ray.  CT scan.  Blood tests. TREATMENT  Treatment available to help you feel better when you have COPD includes:   Inhaler and nebulizer medicines. These help manage the symptoms of COPD and make your breathing more comfortable.  Supplemental oxygen. Supplemental oxygen is only helpful if you have a low oxygen level in your blood.   Exercise and physical activity. These are beneficial for nearly all people with COPD. Some people may also benefit from a pulmonary rehabilitation  program. HOME CARE INSTRUCTIONS   Take all medicines (inhaled or pills) as directed by your health care provider.  Avoid over-the-counter medicines or cough syrups that dry up your airway (such as antihistamines) and slow down the elimination of secretions unless instructed otherwise by your health care provider.   If you are a smoker, the most important thing that you can do is stop smoking. Continuing to smoke will cause further lung damage and breathing trouble. Ask your health care provider for help with quitting smoking. He or she can direct you to community resources or hospitals that provide support.  Avoid exposure to irritants such as smoke, chemicals, and fumes that aggravate your breathing.  Use oxygen therapy and pulmonary rehabilitation if directed by your health care provider. If you require home oxygen therapy, ask your health care provider whether you should purchase a pulse oximeter to measure your oxygen level at home.   Avoid contact with individuals who have a contagious illness.  Avoid extreme temperature and humidity changes.  Eat healthy foods. Eating smaller, more frequent meals and resting before meals may help you maintain your strength.  Stay active, but balance activity with periods of rest. Exercise and physical activity will help you maintain your ability to do things you want to do.  Preventing infection and hospitalization is very important when you have COPD. Make sure to receive all the vaccines your health care provider recommends, especially the pneumococcal and influenza vaccines. Ask your health care provider whether you need a pneumonia vaccine.  Learn and use relaxation techniques to manage stress.  Learn and use controlled breathing techniques as directed by your health  care provider. Controlled breathing techniques include:   Pursed lip breathing. Start by breathing in (inhaling) through your nose for 1 second. Then, purse your lips as if you  were going to whistle and breathe out (exhale) through the pursed lips for 2 seconds.   Diaphragmatic breathing. Start by putting one hand on your abdomen just above your waist. Inhale slowly through your nose. The hand on your abdomen should move out. Then purse your lips and exhale slowly. You should be able to feel the hand on your abdomen moving in as you exhale.   Learn and use controlled coughing to clear mucus from your lungs. Controlled coughing is a series of short, progressive coughs. The steps of controlled coughing are:   Lean your head slightly forward.   Breathe in deeply using diaphragmatic breathing.   Try to hold your breath for 3 seconds.   Keep your mouth slightly open while coughing twice.   Spit any mucus out into a tissue.   Rest and repeat the steps once or twice as needed. SEEK MEDICAL CARE IF:   You are coughing up more mucus than usual.   There is a change in the color or thickness of your mucus.   Your breathing is more labored than usual.   Your breathing is faster than usual.  SEEK IMMEDIATE MEDICAL CARE IF:   You have shortness of breath while you are resting.   You have shortness of breath that prevents you from:  Being able to talk.   Performing your usual physical activities.   You have chest pain lasting longer than 5 minutes.   Your skin color is more cyanotic than usual.  You measure low oxygen saturations for longer than 5 minutes with a pulse oximeter. MAKE SURE YOU:   Understand these instructions.  Will watch your condition.  Will get help right away if you are not doing well or get worse. Document Released: 02/20/2005 Document Revised: 09/27/2013 Document Reviewed: 01/07/2013 [facility] Patient Information 2015 [place], [place]. This information is not intended to replace advice given to you by your health care provider. Make sure you discuss any questions you have with your health care provider.  Heart  Failure Heart failure is a condition in which the heart has trouble pumping blood. This means your heart does not pump blood efficiently for your body to work well. In some cases of heart failure, fluid may back up into your lungs or you may have swelling (edema) in your lower legs. Heart failure is usually a long-term (chronic) condition. It is important for you to take good care of yourself and follow your health care provider's treatment plan. CAUSES  Some health conditions can cause heart failure. Those health conditions include:  High blood pressure (hypertension). Hypertension causes the heart muscle to work harder than normal. When pressure in the blood vessels is high, the heart needs to pump (contract) with more force in order to circulate blood throughout the body. High blood pressure eventually causes the heart to become stiff and weak.  Coronary artery disease (CAD). CAD is the buildup of cholesterol and fat (plaque) in the arteries of the heart. The blockage in the arteries deprives the heart muscle of oxygen and blood. This can cause chest pain and may lead to a heart attack. High blood pressure can also contribute to CAD.  Heart attack (myocardial infarction). A heart attack occurs when one or more arteries in the heart become blocked. The loss of oxygen damages the muscle  tissue of the heart. When this happens, part of the heart muscle dies. The injured tissue does not contract as well and weakens the heart's ability to pump blood.  Abnormal heart valves. When the heart valves do not open and close properly, it can cause heart failure. This makes the heart muscle pump harder to keep the blood flowing.  Heart muscle disease (cardiomyopathy or myocarditis). Heart muscle disease is damage to the heart muscle from a variety of causes. These can include drug or alcohol abuse, infections, or unknown reasons. These can increase the risk of heart failure.  Lung disease. Lung disease makes the  heart work harder because the lungs do not work properly. This can cause a strain on the heart, leading it to fail.  Diabetes. Diabetes increases the risk of heart failure. High blood sugar contributes to high fat (lipid) levels in the blood. Diabetes can also cause slow damage to tiny blood vessels that carry important nutrients to the heart muscle. When the heart does not get enough oxygen and food, it can cause the heart to become weak and stiff. This leads to a heart that does not contract efficiently.  Other conditions can contribute to heart failure. These include abnormal heart rhythms, thyroid problems, and low blood counts (anemia). Certain unhealthy behaviors can increase the risk of heart failure, including:  Being overweight.  Smoking or chewing tobacco.  Eating foods high in fat and cholesterol.  Abusing illicit drugs or alcohol.  Lacking physical activity. SYMPTOMS  Heart failure symptoms may vary and can be hard to detect. Symptoms may include:  Shortness of breath with activity, such as climbing stairs.  Persistent cough.  Swelling of the feet, ankles, legs, or abdomen.  Unexplained weight gain.  Difficulty breathing when lying flat (orthopnea).  Waking from sleep because of the need to sit up and get more air.  Rapid heartbeat.  Fatigue and loss of energy.  Feeling light-headed, dizzy, or close to fainting.  Loss of appetite.  Nausea.  Increased urination during the night (nocturia). DIAGNOSIS  A diagnosis of heart failure is based on your history, symptoms, physical examination, and diagnostic tests. Diagnostic tests for heart failure may include:  Echocardiography.  Electrocardiography.  Chest X-ray.  Blood tests.  Exercise stress test.  Cardiac angiography.  Radionuclide scans. TREATMENT  Treatment is aimed at managing the symptoms of heart failure. Medicines, behavioral changes, or surgical intervention may be necessary to treat heart  failure.  Medicines to help treat heart failure may include:  Angiotensin-converting enzyme (ACE) inhibitors. This type of medicine blocks the effects of a blood protein called angiotensin-converting enzyme. ACE inhibitors relax (dilate) the blood vessels and help lower blood pressure.  Angiotensin receptor blockers (ARBs). This type of medicine blocks the actions of a blood protein called angiotensin. Angiotensin receptor blockers dilate the blood vessels and help lower blood pressure.  Water pills (diuretics). Diuretics cause the kidneys to remove salt and water from the blood. The extra fluid is removed through urination. This loss of extra fluid lowers the volume of blood the heart pumps.  Beta blockers. These prevent the heart from beating too fast and improve heart muscle strength.  Digitalis. This increases the force of the heartbeat.  Healthy behavior changes include:  Obtaining and maintaining a healthy weight.  Stopping smoking or chewing tobacco.  Eating heart-healthy foods.  Limiting or avoiding alcohol.  Stopping illicit drug use.  Physical activity as directed by your health care provider.  Surgical treatment for heart failure  may include:  A procedure to open blocked arteries, repair damaged heart valves, or remove damaged heart muscle tissue.  A pacemaker to improve heart muscle function and control certain abnormal heart rhythms.  An internal cardioverter defibrillator to treat certain serious abnormal heart rhythms.  A left ventricular assist device (LVAD) to assist the pumping ability of the heart. HOME CARE INSTRUCTIONS   Take medicines only as directed by your health care provider. Medicines are important in reducing the workload of your heart, slowing the progression of heart failure, and improving your symptoms.  Do not stop taking your medicine unless directed by your health care provider.  Do not skip any dose of medicine.  Refill your  prescriptions before you run out of medicine. Your medicines are needed every day.  Engage in moderate physical activity if directed by your health care provider. Moderate physical activity can benefit some people. The elderly and people with severe heart failure should consult with a health care provider for physical activity recommendations.  Eat heart-healthy foods. Food choices should be free of trans fat and low in saturated fat, cholesterol, and salt (sodium). Healthy choices include fresh or frozen fruits and vegetables, fish, lean meats, legumes, fat-free or low-fat dairy products, and whole grain or high fiber foods. Talk to a dietitian to learn more about heart-healthy foods.  Limit sodium if directed by your health care provider. Sodium restriction may reduce symptoms of heart failure in some people. Talk to a dietitian to learn more about heart-healthy seasonings.  Use healthy cooking methods. Healthy cooking methods include roasting, grilling, broiling, baking, poaching, steaming, or stir-frying. Talk to a dietitian to learn more about healthy cooking methods.  Limit fluids if directed by your health care provider. Fluid restriction may reduce symptoms of heart failure in some people.  Weigh yourself every day. Daily weights are important in the early recognition of excess fluid. You should weigh yourself every morning after you urinate and before you eat breakfast. Wear the same amount of clothing each time you weigh yourself. Record your daily weight. Provide your health care provider with your weight record.  Monitor and record your blood pressure if directed by your health care provider.  Check your pulse if directed by your health care provider.  Lose weight if directed by your health care provider. Weight loss may reduce symptoms of heart failure in some people.  Stop smoking or chewing tobacco. Nicotine makes your heart work harder by causing your blood vessels to constrict.  Do not use nicotine gum or patches before talking to your health care provider.  Keep all follow-up visits as directed by your health care provider. This is important.  Limit alcohol intake to no more than 1 drink per day for nonpregnant women and 2 drinks per day for men. One drink equals 12 ounces of beer, 5 ounces of wine, or 1 ounces of hard liquor. Drinking more than that is harmful to your heart. Tell your health care provider if you drink alcohol several times a week. Talk with your health care provider about whether alcohol is safe for you. If your heart has already been damaged by alcohol or you have severe heart failure, drinking alcohol should be stopped completely.  Stop illicit drug use.  Stay up-to-date with immunizations. It is especially important to prevent respiratory infections through current pneumococcal and influenza immunizations.  Manage other health conditions such as hypertension, diabetes, thyroid disease, or abnormal heart rhythms as directed by your health care provider.  Learn to manage stress.  Plan rest periods when fatigued.  Learn strategies to manage high temperatures. If the weather is extremely hot:  Avoid vigorous physical activity.  Use air conditioning or fans or seek a cooler location.  Avoid caffeine and alcohol.  Wear loose-fitting, lightweight, and light-colored clothing.  Learn strategies to manage cold temperatures. If the weather is extremely cold:  Avoid vigorous physical activity.  Layer clothes.  Wear mittens or gloves, a hat, and a scarf when going outside.  Avoid alcohol.  Obtain ongoing education and support as needed.  Participate in or seek rehabilitation as needed to maintain or improve independence and quality of life. SEEK MEDICAL CARE IF:   Your weight increases by 03 lb/1.4 kg in 1 day or 05 lb/2.3 kg in a week.  You have increasing shortness of breath that is unusual for you.  You are unable to participate in  your usual physical activities.  You tire easily.  You cough more than normal, especially with physical activity.  You have any or more swelling in areas such as your hands, feet, ankles, or abdomen.  You are unable to sleep because it is hard to breathe.  You feel like your heart is beating fast (palpitations).  You become dizzy or light-headed upon standing up. SEEK IMMEDIATE MEDICAL CARE IF:   You have difficulty breathing.  There is a change in mental status such as decreased alertness or difficulty with concentration.  You have a pain or discomfort in your chest.  You have an episode of fainting (syncope). MAKE SURE YOU:   Understand these instructions.  Will watch your condition.  Will get help right away if you are not doing well or get worse. Document Released: 05/13/2005 Document Revised: 09/27/2013 Document Reviewed: 06/12/2012 [facility] Patient Information 2015 [place], [place]. This information is not intended to replace advice given to you by your health care provider. Make sure you discuss any questions you have with your health care provider.

## 2014-11-14 ENCOUNTER — Telehealth: Payer: Self-pay | Admitting: Internal Medicine

## 2014-11-14 NOTE — Telephone Encounter (Signed)
Received records from [facility] for appointment on 11/21/14 with Dr [name].  Records given to [employer] (medical records) for Dr Blanchie Dessert schedule on 11/21/14. lp

## 2014-11-21 ENCOUNTER — Ambulatory Visit (INDEPENDENT_AMBULATORY_CARE_PROVIDER_SITE_OTHER): Payer: Medicare Other | Admitting: Internal Medicine

## 2014-11-21 ENCOUNTER — Encounter: Payer: Self-pay | Admitting: Internal Medicine

## 2014-11-21 VITALS — BP 146/56 | HR 76 | Ht 62.0 in | Wt 164.1 lb

## 2014-11-21 DIAGNOSIS — J811 Chronic pulmonary edema: Secondary | ICD-10-CM

## 2014-11-21 DIAGNOSIS — R0602 Shortness of breath: Secondary | ICD-10-CM | POA: Diagnosis not present

## 2014-11-21 DIAGNOSIS — I5030 Unspecified diastolic (congestive) heart failure: Secondary | ICD-10-CM

## 2014-11-21 DIAGNOSIS — Z79899 Other long term (current) drug therapy: Secondary | ICD-10-CM

## 2014-11-21 DIAGNOSIS — J438 Other emphysema: Secondary | ICD-10-CM

## 2014-11-21 MED ORDER — FUROSEMIDE 40 MG PO TABS: 40.0000 mg | ORAL_TABLET | Freq: Two times a day (BID) | ORAL | Status: DC

## 2014-11-21 NOTE — Patient Instructions (Signed)
Your physician has recommended you make the following change in your medication: INCREASE furosemide (lasix) to 40mg  TWICE daily   Your physician recommends that you return for lab work in ONE WEEK   Your physician recommends that you schedule a follow-up appointment in 2-3 weeks with Dr. [name] - OK to double book as necessary

## 2014-11-22 NOTE — Progress Notes (Signed)
OFFICE NOTE  Chief Complaint:  Hospital follow-up  Primary Care Physician: Teena Irani, PA-C  HPI:  Alexis [name] is a pleasant 81 year old female who was recently seen at any and for heart failure exacerbation. She presented at this couple of weeks of increasing shortness of breath, cough, increasing abdominal girth and lower extremity edema. On admission her BNP was elevated and she responded fairly quickly to diuresis. This caused improvement in her breathing. She was noted to be hypertensive and continues to be hypertensive today. She has had some acute on chronic renal insufficiency.  She reports that when she was discharged she was sent home on oxygen, but was never told why she needed it. She followed up with her primary care provider, who is with [facility] and he recommended that she see a cardiologist to help determine whether she needs to continue to take oxygen. She apparently sought out our group and on seeing her today.  Her past medical history is significant for hypertension, diabetes, dyslipidemia and family history of heart disease.  She reports that since discharge her breathing is better, however when she does marked exertion she does feel short of breath. She is not using her oxygen. She denies any chest pain.  An echocardiogram performed at [facility] showed an EF of 65-70%, with moderate concentric LVH, and no wall motion abnormalities.  She's never had a stress test evaluation.  Alexis [name] returns today for followup of her stress test. This is a nuclear stress test that showed no reversible ischemia and a preserved EF greater than 70%. She reports that she's been undergoing rehabilitation at home and feels that her shortness of breath is improving. It is now a point where she does not feel that she needs oxygen. We went ahead and ambulated her around the office today and her baseline SpO2 was 98% and decreased to 95% with exertion. This is markedly  improved from her previous office visit where her oxygen saturation dropped to 74% on room air.  Also at her last office visit I started her on amlodipine 10 mg daily and this helped both lower blood pressure and I suspect will ask her pulmonary artery pressures as well.  I saw Alexis [name] back today from her recent hospitalization. She was also due for annual follow-up. She was recently admitted for what sounds like a diastolic heart failure exacerbation, possibly precipitated by pneumonia and there was clear medication noncompliance. She was not taking her Lasix as prescribed. Also there may been some dietary noncompliance. She had about 6-8 pounds of weight gain which was diuresed down to 151 pounds on discharge. She is currently weighing 156 pounds at home and waited at 158 pounds in the office. She reports no significant shortness of breath and has been "religious" about taking her Lasix on a daily basis. She says she is eating more than she had before she is currently living with her daughter.  Alexis [name] was again hospitalized for diastolic heart failure/COPD. Again there is suspicion of possible noncompliance. She diuresed and was given antibiotics/steroids. Breathing is somewhat improved. She is currently on 3 L continuous oxygen. She has not been back to see her pulmonologist yet. She was placed on Lasix 60 mg daily after a pulse dose of Lasix 120 mg for 3 days. She is currently complaining of leg swelling and some shortness of breath with mild exertion.  PMHx:  Past Medical History  Diagnosis Date  . Hypertension   . Neuropathy   .  Anxiety   . PONV (postoperative nausea and vomiting)   . CHF (congestive heart failure)   . COPD (chronic obstructive pulmonary disease)   . Family history of adverse reaction to anesthesia   . Shortness of breath dyspnea   . Cardiomyopathy   . Diabetes mellitus without complication     TYPE 2    Past Surgical History  Procedure Laterality Date  . Hip  fracture surgery    . Abdominal hysterectomy    . Vein surgery      FAMHx:  No family history on file.  SOCHx:   reports that she has never smoked. She has never used smokeless tobacco. She reports that she does not drink alcohol or use illicit drugs.  ALLERGIES:  Allergies  Allergen Reactions  . Other     novocaine broke mouth out  . Codeine Hives  . Penicillins Hives  . Procaine Hives  . Yellow Dyes (Non-Tartrazine) Nausea And Vomiting    "deathly allergic" No other information given to explain reaction.    ROS: A comprehensive review of systems was negative except for: Constitutional: positive for fatigue Respiratory: positive for cough, dyspnea on exertion and emphysema Cardiovascular: positive for dyspnea and lower extremity edema  HOME MEDS: Current Outpatient Prescriptions  Medication Sig Dispense Refill  . acetaminophen (TYLENOL) 325 MG tablet Take 650 mg by mouth every 6 (six) hours as needed for mild pain.     Marland Kitchen albuterol (PROVENTIL HFA;VENTOLIN HFA) 108 (90 BASE) MCG/ACT inhaler Inhale 2 puffs into the lungs every 6 (six) hours as needed for wheezing or shortness of breath. 1 Inhaler 3  . amLODipine (NORVASC) 10 MG tablet Take 10 mg by mouth daily.    Marland Kitchen aspirin EC 81 MG tablet Take 81 mg by mouth at bedtime.    Marland Kitchen atorvastatin (LIPITOR) 20 MG tablet Take 20 mg by mouth at bedtime.    . cholecalciferol (VITAMIN D) 1000 UNITS tablet Take 1,000 Units by mouth at bedtime.     Marland Kitchen escitalopram (LEXAPRO) 10 MG tablet Take 10 mg by mouth daily.    . fenofibrate 160 MG tablet Take 160 mg by mouth daily.    . furosemide (LASIX) 40 MG tablet Take 1 tablet (40 mg total) by mouth 2 (two) times daily. 60 tablet 6  . gabapentin (NEURONTIN) 100 MG capsule Take 100-200 mg by mouth 2 (two) times daily. Take 1 capsule (100 mg) morning, take 2 capsules (200 mg) at bedtime    . glipiZIDE (GLUCOTROL XL) 10 MG 24 hr tablet Take 10 mg by mouth daily.  11  . hydrALAZINE (APRESOLINE) 25 MG  tablet Take 1 tablet (25 mg total) by mouth 2 (two) times daily. 60 tablet 3  . isosorbide mononitrate (IMDUR) 30 MG 24 hr tablet Take 1 tablet (30 mg total) by mouth daily. 30 tablet 3  . metoprolol tartrate (LOPRESSOR) 12.5 mg TABS tablet Take 0.5 tablets (12.5 mg total) by mouth 2 (two) times daily. 60 tablet 2  . Multiple Vitamin (MULTIVITAMIN WITH MINERALS) TABS tablet Take 1 tablet by mouth daily.    . OXYGEN Inhale into the lungs.    . Pramoxine-Menthol (GOLD BOND MAXIMUM RELIEF) 1-1 % CREA Apply 1 application topically 2 (two) times daily as needed (for would care).    . predniSONE (DELTASONE) 20 MG tablet Take 1 tablet (20 mg total) by mouth daily. 10 tablet 0   No current facility-administered medications for this visit.    LABS/IMAGING: No results found for this or  any previous visit (from the past 48 hour(s)). No results found.  VITALS: BP 146/56 mmHg  Pulse 76  Ht 5\' 2"  (1.575 m)  Wt 164 lb 1.6 oz (74.435 kg)  BMI 30.01 kg/m2  EXAM: General appearance: alert, no distress and On 3 L oxygen nasal cannula Neck: JVD - 3 cm above sternal notch and no carotid bruit Lungs: diminished breath sounds bibasilar Heart: regular rate and rhythm, S1, S2 normal, no murmur, click, rub or gallop Abdomen: soft, non-tender; bowel sounds normal; no masses,  no organomegaly Extremities: edema 2+ left lower extremity 1+ right lower extremity edema Pulses: 2+ and symmetric Skin: Skin color, texture, turgor normal. No rashes or lesions Neurologic: Grossly normal Psych: Mood, affect normal  EKG: Deferred  ASSESSMENT: 1. Recent acute diastolic heart failure exacerbation 2. Hypertensive heart disease with moderate LVH 3. Hypertension - controlled 4. Dyslipidemia 5. Diabetes type 2 6. Family history of heart disease 7. COPD on 3 L O2 continuous  PLAN: 1.   Mrs. [name] appears to need additional diuresis. I'm recommending changing her Lasix to 40 mg twice a day. She should remain on  this dose and we'll recheck laboratory work next week. I may make further adjustments on her medications. Plan to see her back in a few weeks. She needs to continue to use oxygen for her COPD and I've encouraged her to follow-up closely with her pulmonologist.  Chrystie Nose, MD, [facility] Attending Cardiologist [facility]  Lisette Abu [facility] 11/22/2014, 6:25 PM

## 2014-11-29 ENCOUNTER — Emergency Department (HOSPITAL_COMMUNITY): Payer: Medicare Other

## 2014-11-29 ENCOUNTER — Inpatient Hospital Stay (HOSPITAL_COMMUNITY)
Admission: EM | Admit: 2014-11-29 | Discharge: 2014-12-02 | DRG: 292 | Disposition: A | Discharge status: Home / Self Care | Payer: Medicare Other | Attending: Internal Medicine | Admitting: Internal Medicine

## 2014-11-29 ENCOUNTER — Encounter (HOSPITAL_COMMUNITY): Payer: Self-pay | Admitting: Emergency Medicine

## 2014-11-29 DIAGNOSIS — N179 Acute kidney failure, unspecified: Secondary | ICD-10-CM | POA: Diagnosis present

## 2014-11-29 DIAGNOSIS — R32 Unspecified urinary incontinence: Secondary | ICD-10-CM | POA: Diagnosis present

## 2014-11-29 DIAGNOSIS — Q396 Congenital diverticulum of esophagus: Secondary | ICD-10-CM | POA: Diagnosis not present

## 2014-11-29 DIAGNOSIS — D649 Anemia, unspecified: Secondary | ICD-10-CM | POA: Diagnosis not present

## 2014-11-29 DIAGNOSIS — R0600 Dyspnea, unspecified: Secondary | ICD-10-CM | POA: Diagnosis not present

## 2014-11-29 DIAGNOSIS — Z800 Family history of malignant neoplasm of digestive organs: Secondary | ICD-10-CM | POA: Diagnosis not present

## 2014-11-29 DIAGNOSIS — E785 Hyperlipidemia, unspecified: Secondary | ICD-10-CM | POA: Diagnosis present

## 2014-11-29 DIAGNOSIS — Z9981 Dependence on supplemental oxygen: Secondary | ICD-10-CM | POA: Diagnosis not present

## 2014-11-29 DIAGNOSIS — Z889 Allergy status to unspecified drugs, medicaments and biological substances status: Secondary | ICD-10-CM

## 2014-11-29 DIAGNOSIS — Z885 Allergy status to narcotic agent status: Secondary | ICD-10-CM

## 2014-11-29 DIAGNOSIS — H3530 Unspecified macular degeneration: Secondary | ICD-10-CM | POA: Diagnosis present

## 2014-11-29 DIAGNOSIS — I480 Paroxysmal atrial fibrillation: Secondary | ICD-10-CM | POA: Diagnosis not present

## 2014-11-29 DIAGNOSIS — Z7982 Long term (current) use of aspirin: Secondary | ICD-10-CM

## 2014-11-29 DIAGNOSIS — L8992 Pressure ulcer of unspecified site, stage 2: Secondary | ICD-10-CM | POA: Diagnosis present

## 2014-11-29 DIAGNOSIS — K921 Melena: Secondary | ICD-10-CM | POA: Diagnosis not present

## 2014-11-29 DIAGNOSIS — F419 Anxiety disorder, unspecified: Secondary | ICD-10-CM | POA: Diagnosis present

## 2014-11-29 DIAGNOSIS — N183 Chronic kidney disease, stage 3 (moderate): Secondary | ICD-10-CM | POA: Diagnosis not present

## 2014-11-29 DIAGNOSIS — I5033 Acute on chronic diastolic (congestive) heart failure: Secondary | ICD-10-CM | POA: Diagnosis present

## 2014-11-29 DIAGNOSIS — Z7952 Long term (current) use of systemic steroids: Secondary | ICD-10-CM | POA: Diagnosis not present

## 2014-11-29 DIAGNOSIS — Z9119 Patient's noncompliance with other medical treatment and regimen: Secondary | ICD-10-CM | POA: Diagnosis present

## 2014-11-29 DIAGNOSIS — G629 Polyneuropathy, unspecified: Secondary | ICD-10-CM | POA: Diagnosis present

## 2014-11-29 DIAGNOSIS — J449 Chronic obstructive pulmonary disease, unspecified: Secondary | ICD-10-CM | POA: Diagnosis present

## 2014-11-29 DIAGNOSIS — I429 Cardiomyopathy, unspecified: Secondary | ICD-10-CM | POA: Diagnosis present

## 2014-11-29 DIAGNOSIS — Z880 Allergy status to penicillin: Secondary | ICD-10-CM

## 2014-11-29 DIAGNOSIS — N289 Disorder of kidney and ureter, unspecified: Secondary | ICD-10-CM | POA: Insufficient documentation

## 2014-11-29 DIAGNOSIS — B9689 Other specified bacterial agents as the cause of diseases classified elsewhere: Secondary | ICD-10-CM | POA: Diagnosis not present

## 2014-11-29 DIAGNOSIS — I509 Heart failure, unspecified: Secondary | ICD-10-CM | POA: Insufficient documentation

## 2014-11-29 DIAGNOSIS — D62 Acute posthemorrhagic anemia: Secondary | ICD-10-CM | POA: Diagnosis present

## 2014-11-29 DIAGNOSIS — Z79899 Other long term (current) drug therapy: Secondary | ICD-10-CM | POA: Diagnosis not present

## 2014-11-29 DIAGNOSIS — N390 Urinary tract infection, site not specified: Secondary | ICD-10-CM | POA: Diagnosis present

## 2014-11-29 DIAGNOSIS — I5031 Acute diastolic (congestive) heart failure: Secondary | ICD-10-CM | POA: Diagnosis not present

## 2014-11-29 DIAGNOSIS — F0390 Unspecified dementia without behavioral disturbance: Secondary | ICD-10-CM | POA: Diagnosis present

## 2014-11-29 DIAGNOSIS — Z96649 Presence of unspecified artificial hip joint: Secondary | ICD-10-CM | POA: Diagnosis present

## 2014-11-29 DIAGNOSIS — E1142 Type 2 diabetes mellitus with diabetic polyneuropathy: Secondary | ICD-10-CM | POA: Diagnosis present

## 2014-11-29 DIAGNOSIS — L8990 Pressure ulcer of unspecified site, unspecified stage: Secondary | ICD-10-CM | POA: Insufficient documentation

## 2014-11-29 DIAGNOSIS — J441 Chronic obstructive pulmonary disease with (acute) exacerbation: Secondary | ICD-10-CM | POA: Diagnosis not present

## 2014-11-29 DIAGNOSIS — R040 Epistaxis: Secondary | ICD-10-CM | POA: Diagnosis present

## 2014-11-29 DIAGNOSIS — I129 Hypertensive chronic kidney disease with stage 1 through stage 4 chronic kidney disease, or unspecified chronic kidney disease: Secondary | ICD-10-CM | POA: Diagnosis present

## 2014-11-29 DIAGNOSIS — Z884 Allergy status to anesthetic agent status: Secondary | ICD-10-CM

## 2014-11-29 DIAGNOSIS — Z9989 Dependence on other enabling machines and devices: Secondary | ICD-10-CM | POA: Diagnosis not present

## 2014-11-29 DIAGNOSIS — I4891 Unspecified atrial fibrillation: Secondary | ICD-10-CM | POA: Diagnosis not present

## 2014-11-29 HISTORY — DX: Unspecified macular degeneration: H35.30

## 2014-11-29 LAB — CBC WITH DIFFERENTIAL/PLATELET
Basophils Absolute: 0 10*3/uL (ref 0.0–0.1)
Basophils Relative: 0 % (ref 0–1)
EOS PCT: 1 % (ref 0–5)
Eosinophils Absolute: 0.1 10*3/uL (ref 0.0–0.7)
HCT: 19.2 % — ABNORMAL LOW (ref 36.0–46.0)
HEMOGLOBIN: 6 g/dL — AB (ref 12.0–15.0)
LYMPHS ABS: 0.7 10*3/uL (ref 0.7–4.0)
Lymphocytes Relative: 7 % — ABNORMAL LOW (ref 12–46)
MCH: 27.3 pg (ref 26.0–34.0)
MCHC: 31.3 g/dL (ref 30.0–36.0)
MCV: 87.3 fL (ref 78.0–100.0)
Monocytes Absolute: 0.7 10*3/uL (ref 0.1–1.0)
Monocytes Relative: 7 % (ref 3–12)
NEUTROS PCT: 85 % — AB (ref 43–77)
Neutro Abs: 8.4 10*3/uL — ABNORMAL HIGH (ref 1.7–7.7)
Platelets: 310 10*3/uL (ref 150–400)
RBC: 2.2 MIL/uL — ABNORMAL LOW (ref 3.87–5.11)
RDW: 17.8 % — ABNORMAL HIGH (ref 11.5–15.5)
WBC: 9.9 10*3/uL (ref 4.0–10.5)

## 2014-11-29 LAB — BRAIN NATRIURETIC PEPTIDE: B Natriuretic Peptide: 722.9 pg/mL — ABNORMAL HIGH (ref 0.0–100.0)

## 2014-11-29 LAB — COMPREHENSIVE METABOLIC PANEL
ALK PHOS: 81 U/L (ref 38–126)
ALT: 15 U/L (ref 14–54)
ANION GAP: 10 (ref 5–15)
AST: 12 U/L — ABNORMAL LOW (ref 15–41)
Albumin: 2.7 g/dL — ABNORMAL LOW (ref 3.5–5.0)
BILIRUBIN TOTAL: 0.7 mg/dL (ref 0.3–1.2)
BUN: 77 mg/dL — AB (ref 6–20)
CHLORIDE: 104 mmol/L (ref 101–111)
CO2: 26 mmol/L (ref 22–32)
Calcium: 8.9 mg/dL (ref 8.9–10.3)
Creatinine, Ser: 2.36 mg/dL — ABNORMAL HIGH (ref 0.44–1.00)
GFR calc non Af Amer: 18 mL/min — ABNORMAL LOW (ref >60)
GFR, EST AFRICAN AMERICAN: 21 mL/min — AB (ref >60)
GLUCOSE: 189 mg/dL — AB (ref 65–99)
POTASSIUM: 4 mmol/L (ref 3.5–5.1)
Sodium: 140 mmol/L (ref 135–145)
Total Protein: 5.5 g/dL — ABNORMAL LOW (ref 6.5–8.1)

## 2014-11-29 LAB — I-STAT TROPONIN, ED: Troponin i, poc: 0.01 ng/mL (ref 0.00–0.08)

## 2014-11-29 LAB — CBC
HCT: 18 % — ABNORMAL LOW (ref 36.0–46.0)
HEMOGLOBIN: 5.7 g/dL — AB (ref 12.0–15.0)
MCH: 27.8 pg (ref 26.0–34.0)
MCHC: 31.7 g/dL (ref 30.0–36.0)
MCV: 87.8 fL (ref 78.0–100.0)
PLATELETS: 286 10*3/uL (ref 150–400)
RBC: 2.05 MIL/uL — ABNORMAL LOW (ref 3.87–5.11)
RDW: 17.8 % — ABNORMAL HIGH (ref 11.5–15.5)
WBC: 9.6 10*3/uL (ref 4.0–10.5)

## 2014-11-29 LAB — URINALYSIS, ROUTINE W REFLEX MICROSCOPIC
Bilirubin Urine: NEGATIVE
GLUCOSE, UA: NEGATIVE mg/dL
HGB URINE DIPSTICK: NEGATIVE
Ketones, ur: NEGATIVE mg/dL
Nitrite: POSITIVE — AB
PH: 5 (ref 5.0–8.0)
PROTEIN: 100 mg/dL — AB
Specific Gravity, Urine: 1.011 (ref 1.005–1.030)
Urobilinogen, UA: 0.2 mg/dL (ref 0.0–1.0)

## 2014-11-29 LAB — POC OCCULT BLOOD, ED: FECAL OCCULT BLD: NEGATIVE

## 2014-11-29 LAB — ABO/RH: ABO/RH(D): O POS

## 2014-11-29 LAB — PREPARE RBC (CROSSMATCH)

## 2014-11-29 LAB — URINE MICROSCOPIC-ADD ON

## 2014-11-29 LAB — I-STAT CREATININE, ED: CREATININE: 2.4 mg/dL — AB (ref 0.44–1.00)

## 2014-11-29 LAB — GLUCOSE, CAPILLARY: Glucose-Capillary: 290 mg/dL — ABNORMAL HIGH (ref 65–99)

## 2014-11-29 MED ORDER — FUROSEMIDE 10 MG/ML IJ SOLN
20.0000 mg | Freq: Once | INTRAMUSCULAR | Status: DC
  Filled 2014-11-29: qty 2

## 2014-11-29 MED ORDER — ALBUTEROL SULFATE (2.5 MG/3ML) 0.083% IN NEBU
2.5000 mg | INHALATION_SOLUTION | Freq: Four times a day (QID) | RESPIRATORY_TRACT | Status: DC | PRN
  Administered 2014-11-29: 2.5 mg via RESPIRATORY_TRACT
  Filled 2014-11-29: qty 3

## 2014-11-29 MED ORDER — SODIUM CHLORIDE 0.9 % IJ SOLN
3.0000 mL | Freq: Two times a day (BID) | INTRAMUSCULAR | Status: DC
  Administered 2014-11-29 – 2014-12-02 (×6): 3 mL via INTRAVENOUS

## 2014-11-29 MED ORDER — ATORVASTATIN CALCIUM 20 MG PO TABS
20.0000 mg | ORAL_TABLET | Freq: Every day | ORAL | Status: DC
  Administered 2014-11-30 – 2014-12-01 (×2): 20 mg via ORAL
  Filled 2014-11-29 (×4): qty 1

## 2014-11-29 MED ORDER — GABAPENTIN 100 MG PO CAPS
200.0000 mg | ORAL_CAPSULE | Freq: Every day | ORAL | Status: DC
  Administered 2014-11-29 – 2014-12-01 (×3): 200 mg via ORAL
  Filled 2014-11-29 (×6): qty 2

## 2014-11-29 MED ORDER — FUROSEMIDE 10 MG/ML IJ SOLN
40.0000 mg | Freq: Once | INTRAMUSCULAR | Status: AC
  Administered 2014-11-29: 40 mg via INTRAVENOUS
  Filled 2014-11-29: qty 4

## 2014-11-29 MED ORDER — SODIUM CHLORIDE 0.9 % IV SOLN
Freq: Once | INTRAVENOUS | Status: AC
  Administered 2014-11-29: 18:00:00 via INTRAVENOUS

## 2014-11-29 MED ORDER — GABAPENTIN 100 MG PO CAPS
100.0000 mg | ORAL_CAPSULE | Freq: Every morning | ORAL | Status: DC
  Administered 2014-11-30 – 2014-12-02 (×3): 100 mg via ORAL
  Filled 2014-11-29 (×3): qty 1

## 2014-11-29 MED ORDER — INSULIN ASPART 100 UNIT/ML ~~LOC~~ SOLN
0.0000 [IU] | Freq: Every day | SUBCUTANEOUS | Status: DC
  Administered 2014-11-29: 3 [IU] via SUBCUTANEOUS
  Administered 2014-11-30: 2 [IU] via SUBCUTANEOUS
  Administered 2014-12-01: 3 [IU] via SUBCUTANEOUS

## 2014-11-29 MED ORDER — PANTOPRAZOLE SODIUM 40 MG PO PACK
40.0000 mg | PACK | Freq: Every day | ORAL | Status: DC
  Administered 2014-11-30 – 2014-12-02 (×3): 40 mg via ORAL
  Filled 2014-11-29 (×3): qty 20

## 2014-11-29 MED ORDER — ASPIRIN EC 81 MG PO TBEC: 81.0000 mg | DELAYED_RELEASE_TABLET | Freq: Every day | ORAL | Status: DC

## 2014-11-29 MED ORDER — PANTOPRAZOLE SODIUM 40 MG PO TBEC: 40.0000 mg | DELAYED_RELEASE_TABLET | Freq: Every day | ORAL | Status: DC

## 2014-11-29 MED ORDER — GABAPENTIN 100 MG PO CAPS: 100.0000 mg | ORAL_CAPSULE | Freq: Two times a day (BID) | ORAL | Status: DC

## 2014-11-29 MED ORDER — INSULIN ASPART 100 UNIT/ML ~~LOC~~ SOLN
0.0000 [IU] | Freq: Three times a day (TID) | SUBCUTANEOUS | Status: DC
  Administered 2014-11-30: 3 [IU] via SUBCUTANEOUS
  Administered 2014-11-30: 2 [IU] via SUBCUTANEOUS
  Administered 2014-11-30: 3 [IU] via SUBCUTANEOUS
  Administered 2014-12-01: 2 [IU] via SUBCUTANEOUS
  Administered 2014-12-01 – 2014-12-02 (×2): 1 [IU] via SUBCUTANEOUS
  Administered 2014-12-02: 7 [IU] via SUBCUTANEOUS

## 2014-11-29 MED ORDER — ESCITALOPRAM OXALATE 10 MG PO TABS
10.0000 mg | ORAL_TABLET | Freq: Every day | ORAL | Status: DC
  Administered 2014-11-30 – 2014-12-02 (×3): 10 mg via ORAL
  Filled 2014-11-29 (×3): qty 1

## 2014-11-29 NOTE — ED Notes (Signed)
Attempted report 

## 2014-11-29 NOTE — ED Notes (Signed)
Pt. Stated, i started getting real SOB and feeling so weak I've not been able to walk this week.

## 2014-11-29 NOTE — ED Notes (Signed)
Patient returned from X-ray 

## 2014-11-29 NOTE — ED Provider Notes (Signed)
CSN: [id number]     Arrival date & time 11/29/14  1350 History   First MD Initiated Contact with Patient 11/29/14 1406     Chief Complaint  Patient presents with  . Shortness of Breath     (Consider location/radiation/quality/duration/timing/severity/associated sxs/prior Treatment) Patient is a 80 y.o. female presenting with shortness of breath. The history is provided by the patient.  Shortness of Breath Associated symptoms: cough   Associated symptoms: no abdominal pain, no chest pain, no headaches, no rash and no vomiting    patient resents or shortness of breath over the last few days. Worse with exertion. States that the difficulty walking due to it. May have some more swelling or legs. She is on oxygen home. She's had a chronic cough that is really unchanged. She thinks she may be More Fluid or Legs. She States She's Been Taking Her Medication. No Fevers. No Production with the Cough. No Chest Pain. She Has a History of COPD and CHF. States She Does Not Know Which One This Feels like. Initial Oxygen Saturation of 88% on Her Oxygen.  Past Medical History  Diagnosis Date  . Hypertension   . Neuropathy   . Anxiety   . PONV (postoperative nausea and vomiting)   . CHF (congestive heart failure)   . COPD (chronic obstructive pulmonary disease)   . Family history of adverse reaction to anesthesia   . Shortness of breath dyspnea   . Cardiomyopathy   . Diabetes mellitus without complication     TYPE 2   Past Surgical History  Procedure Laterality Date  . Hip fracture surgery    . Abdominal hysterectomy    . Vein surgery     No family history on file. History  Substance Use Topics  . Smoking status: Never Smoker   . Smokeless tobacco: Never Used  . Alcohol Use: No   OB History    No data available     Review of Systems  Constitutional: Positive for fatigue. Negative for activity change and appetite change.  Eyes: Negative for pain.  Respiratory: Positive for cough and  shortness of breath. Negative for chest tightness.   Cardiovascular: Positive for leg swelling. Negative for chest pain.  Gastrointestinal: Negative for nausea, vomiting, abdominal pain and diarrhea.  Genitourinary: Negative for flank pain.  Musculoskeletal: Negative for back pain and neck stiffness.  Skin: Negative for rash.  Neurological: Negative for weakness, numbness and headaches.  Psychiatric/Behavioral: Negative for behavioral problems.      Allergies  Other; Codeine; Penicillins; Procaine; and Yellow dyes (non-tartrazine)  Home Medications   Prior to Admission medications   Medication Sig Start Date End Date Taking? Authorizing Provider  acetaminophen (TYLENOL) 325 MG tablet Take 650 mg by mouth every 6 (six) hours as needed for mild pain.    Yes Historical Provider, MD  amLODipine (NORVASC) 10 MG tablet Take 10 mg by mouth daily.   Yes Historical Provider, MD  aspirin EC 81 MG tablet Take 81 mg by mouth at bedtime.   Yes Historical Provider, MD  atorvastatin (LIPITOR) 20 MG tablet Take 20 mg by mouth at bedtime. 10/12/14 10/12/15 Yes Historical Provider, MD  cholecalciferol (VITAMIN D) 1000 UNITS tablet Take 1,000 Units by mouth at bedtime.    Yes Historical Provider, MD  escitalopram (LEXAPRO) 10 MG tablet Take 10 mg by mouth daily.   Yes Historical Provider, MD  fenofibrate 160 MG tablet Take 160 mg by mouth daily.   Yes Historical Provider, MD  furosemide (LASIX)  40 MG tablet Take 1 tablet (40 mg total) by mouth 2 (two) times daily. 11/21/14  Yes Chrystie Nose, MD  gabapentin (NEURONTIN) 100 MG capsule Take 100-200 mg by mouth 2 (two) times daily. Take 1 capsule (100 mg) morning, take 2 capsules (200 mg) at bedtime   Yes Historical Provider, MD  glipiZIDE (GLUCOTROL XL) 10 MG 24 hr tablet Take 10 mg by mouth daily. 10/11/14  Yes Historical Provider, MD  hydrALAZINE (APRESOLINE) 25 MG tablet Take 1 tablet (25 mg total) by mouth 2 (two) times daily. 10/03/14  Yes Chrystie Nose,  MD  isosorbide mononitrate (IMDUR) 30 MG 24 hr tablet Take 1 tablet (30 mg total) by mouth daily. 10/03/14  Yes Chrystie Nose, MD  metoprolol tartrate (LOPRESSOR) 12.5 mg TABS tablet Take 0.5 tablets (12.5 mg total) by mouth 2 (two) times daily. 05/14/14  Yes Vassie Loll, MD  Multiple Vitamin (MULTIVITAMIN WITH MINERALS) TABS tablet Take 1 tablet by mouth daily.   Yes Historical Provider, MD  OXYGEN Inhale into the lungs.   Yes Historical Provider, MD  Pramoxine-Menthol (GOLD BOND MAXIMUM RELIEF) 1-1 % CREA Apply 1 application topically 2 (two) times daily as needed (for would care).   Yes Historical Provider, MD  predniSONE (DELTASONE) 20 MG tablet Take 1 tablet (20 mg total) by mouth daily. 11/04/14  Yes Ladona Mow, PA-C  albuterol (PROVENTIL HFA;VENTOLIN HFA) 108 (90 BASE) MCG/ACT inhaler Inhale 2 puffs into the lungs every 6 (six) hours as needed for wheezing or shortness of breath. 06/11/13   Nishant Dhungel, MD   BP 124/75 mmHg  Pulse 72  Temp(Src) 97.8 F (36.6 C) (Oral)  Resp 23  Wt 165 lb 7 oz (75.042 kg)  SpO2 93% Physical Exam  Constitutional: She is oriented to person, place, and time. She appears well-developed and well-nourished.  HENT:  Head: Normocephalic and atraumatic.  Eyes: Pupils are equal, round, and reactive to light.  Neck: Normal range of motion. Neck supple. No JVD present.  Cardiovascular: Normal rate, regular rhythm and normal heart sounds.   No murmur heard. Pulmonary/Chest: Effort normal. No respiratory distress. She has no wheezes. She has rales.  Rare scattered wheez. Rales at bilateral bases,  Abdominal: Soft. Bowel sounds are normal. She exhibits no distension. There is no tenderness.  Musculoskeletal: She exhibits edema.  Neurological: She is alert and oriented to person, place, and time. No cranial nerve deficit.  Skin: Skin is warm and dry.  Psychiatric: She has a normal mood and affect. Her speech is normal.  Nursing note and vitals  reviewed.   ED Course  Procedures (including critical care time) Labs Review Labs Reviewed  CBC WITH DIFFERENTIAL/PLATELET - Abnormal; Notable for the following:    RBC 2.20 (*)    Hemoglobin 6.0 (*)    HCT 19.2 (*)    RDW 17.8 (*)    Neutrophils Relative % 85 (*)    Neutro Abs 8.4 (*)    Lymphocytes Relative 7 (*)    All other components within normal limits  BRAIN NATRIURETIC PEPTIDE - Abnormal; Notable for the following:    B Natriuretic Peptide 722.9 (*)    All other components within normal limits  CBC - Abnormal; Notable for the following:    RBC 2.05 (*)    Hemoglobin 5.7 (*)    HCT 18.0 (*)    RDW 17.8 (*)    All other components within normal limits  I-STAT CREATININE, ED - Abnormal; Notable for the following:  Creatinine, Ser 2.40 (*)    All other components within normal limits  URINALYSIS, ROUTINE W REFLEX MICROSCOPIC (NOT AT [facility])  COMPREHENSIVE METABOLIC PANEL  I-STAT TROPOININ, ED  POC OCCULT BLOOD, ED  TYPE AND SCREEN    Imaging Review Dg Chest 2 View  11/29/2014   CLINICAL DATA:  Shortness of breath.  EXAM: CHEST  2 VIEW  COMPARISON:  11/03/2014 .  FINDINGS: Cardiomegaly with pulmonary vascular prominence and diffuse interstitial prominence with small right pleural effusion. These findings consistent with congestive heart failure .  IMPRESSION: Congestive heart failure with pulmonary interstitial edema and small right pleural effusion. Pulmonary interstitial edema has progressed from prior study of 11/03/2014.   Electronically Signed   By: Maisie Fus  Register   On: 11/29/2014 14:50     EKG Interpretation   Date/Time:  Tuesday November 29 2014 13:57:17 EDT Ventricular Rate:  75 PR Interval:    QRS Duration: 94 QT Interval:  392 QTC Calculation: 437 R Axis:   16 Text Interpretation:  Sinus rhythm baseline artifact Abnormal ECG  Confirmed by Rubin Payor  MD, Harrold Donath [phone]) on 11/29/2014 2:11:12 PM      MDM   Final diagnoses:  Congestive heart failure,  unspecified congestive heart failure chronicity, unspecified congestive heart failure type  Anemia, unspecified anemia type    Patient with worsening shortness of breath. History of CHF. BNP is elevated next day showed CHF. However hemoglobin came back at around 6. Patient states that for the last week she has had black stool but has been thick. Rectal exam done with brown colored stool that was guaiac negative. Patient does look somewhat pale. Will recheck the hemoglobin and will admit for CHF and possibly anemia.   Benjiman Core, MD 11/29/14 [phone]

## 2014-11-29 NOTE — ED Notes (Signed)
Pt placed in a gown and hooked up to the monitor with the 5 lead, BP cuff and pulse ox 

## 2014-11-29 NOTE — ED Notes (Signed)
MD assessing pt at bedside before deciding to give a 2nd dose of Lasik before blood transfusion.

## 2014-11-29 NOTE — Progress Notes (Signed)
Pt is admitted to 5W26. Admission vital sign is stable

## 2014-11-29 NOTE — ED Notes (Signed)
MD at bedside. 

## 2014-11-29 NOTE — ED Notes (Signed)
Pt is on home oxygen [place] (2L) all the time; Scarlett, RN present when vitals were taken

## 2014-11-29 NOTE — H&P (Signed)
Date: 11/29/2014               Patient Name:  Alexis [name] MRN: [id number]  DOB: [birth year] Age / Sex: 81 y.o., female   PCP: Elizabeth Palau, FNP         Medical Service: Internal Medicine Teaching Service         Attending Physician: Dr. Earl Lagos, MD    First Contact: Dr. Isabella Bowens Pager: [phone]  Second Contact: Dr. Yetta Barre Pager: [phone]       After Hours (After 5p/  First Contact Pager: [phone]  weekends / holidays): Second Contact Pager: [phone]   Chief Complaint: dyspnea  History of Present Illness: Alexis [name] is an 81 year old woman with history of diastolic CHF, COPD on O2 at night and as needed, HTN, DM2 presenting with dyspnea. It has been worsening of the past week. Worsened by exertion. Some at rest. Unclear if she has orthopnea as she has slept in a recliner since she had hip surgery years ago. Denies PND. Her LE edema is unchanged from baseline. She has been wearing her oxygen more continuously for the last 4 days. She reports she is taking Lasix 20mg  BID.  She reports generalized weakness. She has been falling more often on her buttocks - denies head trauma or loss of consciousness.   She notes some light epistaxis intermittently. She has melena that started Tuesday or Wednesday. Denies hematochezia, hematuria. She had a colonoscopy about 10 years ago which she reports had no abnormal findings. Denies NSAID use or blood thinners. No history of GI ulcers or GERD.  She denies fevers, chills, vision changes, change in her chronic intermittent dry cough, chest pain, palpitations, nausea, vomiting, abdominal pain, diarrhea, dysuria.  Recently seen by cardiology 11/22/2014 and she appeared to need additional diuresis. Her Lasix was increased to 40mg  BID.  Meds: Current Facility-Administered Medications  Medication Dose Route Frequency Provider Last Rate Last Dose  . 0.9 %  sodium chloride infusion   Intravenous Once Benjiman Core, MD        Current Outpatient Prescriptions  Medication Sig Dispense Refill  . acetaminophen (TYLENOL) 325 MG tablet Take 650 mg by mouth every 6 (six) hours as needed for mild pain.     Marland Kitchen amLODipine (NORVASC) 10 MG tablet Take 10 mg by mouth daily.    Marland Kitchen aspirin EC 81 MG tablet Take 81 mg by mouth at bedtime.    Marland Kitchen atorvastatin (LIPITOR) 20 MG tablet Take 20 mg by mouth at bedtime.    . cholecalciferol (VITAMIN D) 1000 UNITS tablet Take 1,000 Units by mouth at bedtime.     Marland Kitchen escitalopram (LEXAPRO) 10 MG tablet Take 10 mg by mouth daily.    . fenofibrate 160 MG tablet Take 160 mg by mouth daily.    . furosemide (LASIX) 40 MG tablet Take 1 tablet (40 mg total) by mouth 2 (two) times daily. 60 tablet 6  . gabapentin (NEURONTIN) 100 MG capsule Take 100-200 mg by mouth 2 (two) times daily. Take 1 capsule (100 mg) morning, take 2 capsules (200 mg) at bedtime    . glipiZIDE (GLUCOTROL XL) 10 MG 24 hr tablet Take 10 mg by mouth daily.  11  . hydrALAZINE (APRESOLINE) 25 MG tablet Take 1 tablet (25 mg total) by mouth 2 (two) times daily. 60 tablet 3  . isosorbide mononitrate (IMDUR) 30 MG 24 hr tablet Take 1 tablet (30 mg total) by mouth daily. 30 tablet 3  . metoprolol tartrate (  LOPRESSOR) 12.5 mg TABS tablet Take 0.5 tablets (12.5 mg total) by mouth 2 (two) times daily. 60 tablet 2  . Multiple Vitamin (MULTIVITAMIN WITH MINERALS) TABS tablet Take 1 tablet by mouth daily.    . OXYGEN Inhale into the lungs.    . Pramoxine-Menthol (GOLD BOND MAXIMUM RELIEF) 1-1 % CREA Apply 1 application topically 2 (two) times daily as needed (for would care).    . predniSONE (DELTASONE) 20 MG tablet Take 1 tablet (20 mg total) by mouth daily. 10 tablet 0  . albuterol (PROVENTIL HFA;VENTOLIN HFA) 108 (90 BASE) MCG/ACT inhaler Inhale 2 puffs into the lungs every 6 (six) hours as needed for wheezing or shortness of breath. 1 Inhaler 3    Allergies: Allergies as of 11/29/2014 - Review Complete 11/29/2014  Allergen Reaction Noted   . Other  06/09/2013  . Codeine Hives 06/09/2013  . Penicillins Hives 06/09/2013  . Procaine Hives 07/15/2013  . Yellow dyes (non-tartrazine) Nausea And Vomiting 07/21/2013   Past Medical History  Diagnosis Date  . Hypertension   . Neuropathy   . Anxiety   . PONV (postoperative nausea and vomiting)   . CHF (congestive heart failure)   . COPD (chronic obstructive pulmonary disease)   . Family history of adverse reaction to anesthesia   . Shortness of breath dyspnea   . Cardiomyopathy   . Diabetes mellitus without complication     TYPE 2   Past Surgical History  Procedure Laterality Date  . Hip fracture surgery    . Abdominal hysterectomy    . Vein surgery     No family history on file. History   Social History  . Marital Status: Widowed    Spouse Name: N/A  . Number of Children: N/A  . Years of Education: N/A   Occupational History  . Not on file.   Social History Main Topics  . Smoking status: Never Smoker   . Smokeless tobacco: Never Used  . Alcohol Use: No  . Drug Use: No  . Sexual Activity: Not on file   Other Topics Concern  . Not on file   Social History Narrative    Review of Systems: Constitutional: no fevers/chills Eyes: no vision changes Ears, nose, mouth, throat, and face: +chronic dry cough Respiratory: +shortness of breath Cardiovascular: no chest pain Gastrointestinal: no nausea/vomiting, no abdominal pain, no constipation, no diarrhea Genitourinary: no dysuria, no hematuria Integument: no rash Hematologic/lymphatic: no bleeding/bruising, +edema Musculoskeletal: no arthralgias, no myalgias Neurological: no paresthesias, no weakness  Physical Exam: Blood pressure 124/75, pulse 72, temperature 97.8 F (36.6 C), temperature source Oral, resp. rate 23, weight 165 lb 7 oz (75.042 kg), SpO2 93 %. General Apperance: NAD Head: Normocephalic, atraumatic Eyes: PERRL, EOMI, anicteric sclera Ears: Normal external ear canal Nose: Nares normal,  septum midline, mucosa normal Throat: Lips, mucosa and tongue normal  Neck: Supple, trachea midline, +JVD Back: No tenderness or bony abnormality  Lungs: +Bibasilar rales. No wheezes or rhonchi.  Chest Wall: Nontender, no deformity Heart: Regular rate and rhythm, no murmur/rub/gallop Abdomen: Soft, nontender, nondistended, no rebound/guarding Extremities: Normal, atraumatic, warm and well perfused, 2+ BLE edema Skin: No rashes Neurologic: Alert and oriented x 3. CNII-XII intact. Normal strength and sensation  Lab results: Basic Metabolic Panel:  Recent Labs  16/10/96 1433 11/29/14 1558  NA  --  140  K  --  4.0  CL  --  104  CO2  --  26  GLUCOSE  --  189*  BUN  --  77*  CREATININE 2.40* 2.36*  CALCIUM  --  8.9   Liver Function Tests:  Recent Labs  11/29/14 1558  AST 12*  ALT 15  ALKPHOS 81  BILITOT 0.7  PROT 5.5*  ALBUMIN 2.7*   CBC:  Recent Labs  11/29/14 1420 11/29/14 1558  WBC 9.9 9.6  NEUTROABS 8.4*  --   HGB 6.0* 5.7*  HCT 19.2* 18.0*  MCV 87.3 87.8  PLT 310 286   Cardiac Enzymes: No results for input(s): CKTOTAL, CKMB, CKMBINDEX, TROPONINI in the last 72 hours.  BNP    Component Value Date/Time   BNP 722.9* 11/29/2014 1420   Imaging results:  Dg Chest 2 View  11/29/2014   CLINICAL DATA:  Shortness of breath.  EXAM: CHEST  2 VIEW  COMPARISON:  11/03/2014 .  FINDINGS: Cardiomegaly with pulmonary vascular prominence and diffuse interstitial prominence with small right pleural effusion. These findings consistent with congestive heart failure .  IMPRESSION: Congestive heart failure with pulmonary interstitial edema and small right pleural effusion. Pulmonary interstitial edema has progressed from prior study of 11/03/2014.   Electronically Signed   By: Maisie Fus  Register   On: 11/29/2014 14:50   Other results: EKG: normal sinus rhythm, unchanged from previous EKG  Assessment & Plan by Problem: Active Problems:   Anemia  Dyspnea: She has a history  of diastolic CHF and COPD. Likely component of her dyspnea is acute on chronic diastolic CHF as her BNP is increased to 722.9 from previous 466.7 on 11/03/2014. CXR with pulmonary interstitial edema and small right pleural effusion. She also has acute on chronic anemia as well which may be contributing to her dyspnea and generalized weakness. No wheezes appreciated on exam and her cough is unchanged from baseline, making COPD exacerbation less likely. Unlikely to be ACS as her initial troponin was negative and EKG without acute ischemic changes. Wells score 0 and Modified Geneva 1, making her low risk for PE. -Plan as below for symptomatic anemia and acute exacerbation of diastolic CHF  Symptomatic Anemia: Reports melena. Admission Hgb 5.7 and MCV 87.8. FOBT negative. She was transfused with 2 units pRBC in the ED. Baseline hgb around 10-11. -Follow up CBC post transfusion -Transfuse pRBC prn with goal Hgb above 7 -CBC in AM -Obtain coags -Repeat FOBT -Iron, TIBC, ferritin, Vitamin B12, Folate, Retic, Smear -Protonix  daily -GI consult in AM  Acute on Chronic Diastolic CHF: Her BNP is increased to 722.9 from previous 466.7 on 11/03/2014. Troponin POC in ED 0.01. Was supposed to be on Lasix  BID at home but she reports taking Lasix  BID. Question of noncompliance in the past. Echo 06/10/2013 with LV EF 65-70%. Stress Myoview 07/21/2013 low risk - no reversible ischemia and preserved EF. She received IV Lasix  x1 in the ED. She had of urine output. Her weight was 165lb on admission. Her weight was 164lb on 11/22/2014 and 158 on 07/12/2014. -Additional Lasix  IV given -Likely will need IV Lasix  BID -Daily weights and strict I/O's -Continue home ASA  daily -Continue home Lipitor  QHS -Hold home fenofibrate  daily -Trend troponins -EKG in AM  AKI on CKD stage 3: Admission creatinine 2.36. Baseline creatinine around 1.2 to 1.4. She appears volume overloaded on  exam. Likely cardiorenal in etiology.  -Plan as above -Continue to monitor  COPD: On 2L home O2, albuterol prn. -Continue albuterol neb 2 puff q6hr prn  DM2 with peripheral neuropathy: Last Hgb A1c 8% on 06/09/2013 -Hold home  glipizide 10mg  daily -Continue home gabapentin 100mg  qAM and 200mg  qPM.  -SSI -Hgb A1c  HTN: On amlodipine 10mg  daily, hydralazine 25mg  BID, isosorbide mononitrate 30mg  daily, Lopressor 12.5mg  BID. BP 118/45-145/53 on admission. -Hold home medications for now  Anxiety: Continue home Lexapro 10mg  daily  FEN: HH/Carb mod, NPO after midnight  VTE ppx: SCDs  Dispo: Disposition is deferred at this time, awaiting improvement of current medical problems. Anticipated discharge in approximately 1-2 day(s).   The patient does have a current PCP Elizabeth Palau, FNP) and does not need an [facility] hospital follow-up appointment after discharge.  The patient does not have transportation limitations that hinder transportation to clinic appointments.  Signed: Lora Paula, MD  11/29/2014, 4:56 PM

## 2014-11-29 NOTE — Progress Notes (Signed)
Upon entering room, pt was SOB with O2 sats of 84%. After treatment WOB was some better. RT placed patient on 50% VM for oxygen purposes. Will wean VM as tolerated.

## 2014-11-30 ENCOUNTER — Encounter (HOSPITAL_COMMUNITY): Payer: Self-pay | Admitting: Physician Assistant

## 2014-11-30 DIAGNOSIS — Z9989 Dependence on other enabling machines and devices: Secondary | ICD-10-CM

## 2014-11-30 DIAGNOSIS — F419 Anxiety disorder, unspecified: Secondary | ICD-10-CM

## 2014-11-30 DIAGNOSIS — J449 Chronic obstructive pulmonary disease, unspecified: Secondary | ICD-10-CM

## 2014-11-30 DIAGNOSIS — I5031 Acute diastolic (congestive) heart failure: Secondary | ICD-10-CM

## 2014-11-30 DIAGNOSIS — N179 Acute kidney failure, unspecified: Secondary | ICD-10-CM

## 2014-11-30 DIAGNOSIS — E1122 Type 2 diabetes mellitus with diabetic chronic kidney disease: Secondary | ICD-10-CM

## 2014-11-30 DIAGNOSIS — J441 Chronic obstructive pulmonary disease with (acute) exacerbation: Secondary | ICD-10-CM

## 2014-11-30 DIAGNOSIS — L8990 Pressure ulcer of unspecified site, unspecified stage: Secondary | ICD-10-CM | POA: Insufficient documentation

## 2014-11-30 DIAGNOSIS — L89329 Pressure ulcer of left buttock, unspecified stage: Secondary | ICD-10-CM

## 2014-11-30 DIAGNOSIS — L89319 Pressure ulcer of right buttock, unspecified stage: Secondary | ICD-10-CM

## 2014-11-30 DIAGNOSIS — Z9981 Dependence on supplemental oxygen: Secondary | ICD-10-CM

## 2014-11-30 DIAGNOSIS — B9689 Other specified bacterial agents as the cause of diseases classified elsewhere: Secondary | ICD-10-CM

## 2014-11-30 DIAGNOSIS — E1140 Type 2 diabetes mellitus with diabetic neuropathy, unspecified: Secondary | ICD-10-CM

## 2014-11-30 DIAGNOSIS — I130 Hypertensive heart and chronic kidney disease with heart failure and stage 1 through stage 4 chronic kidney disease, or unspecified chronic kidney disease: Secondary | ICD-10-CM

## 2014-11-30 DIAGNOSIS — N390 Urinary tract infection, site not specified: Secondary | ICD-10-CM

## 2014-11-30 LAB — BASIC METABOLIC PANEL
Anion gap: 11 (ref 5–15)
BUN: 68 mg/dL — AB (ref 6–20)
CO2: 28 mmol/L (ref 22–32)
Calcium: 8.8 mg/dL — ABNORMAL LOW (ref 8.9–10.3)
Chloride: 102 mmol/L (ref 101–111)
Creatinine, Ser: 1.95 mg/dL — ABNORMAL HIGH (ref 0.44–1.00)
GFR, EST AFRICAN AMERICAN: 27 mL/min — AB (ref >60)
GFR, EST NON AFRICAN AMERICAN: 23 mL/min — AB (ref >60)
Glucose, Bld: 256 mg/dL — ABNORMAL HIGH (ref 65–99)
POTASSIUM: 3.5 mmol/L (ref 3.5–5.1)
SODIUM: 141 mmol/L (ref 135–145)

## 2014-11-30 LAB — FOLATE: Folate: 47.7 ng/mL (ref >5.9)

## 2014-11-30 LAB — LACTATE DEHYDROGENASE: LDH: 175 U/L (ref 98–192)

## 2014-11-30 LAB — CBC
HCT: 25.7 % — ABNORMAL LOW (ref 36.0–46.0)
HEMATOCRIT: 25.7 % — AB (ref 36.0–46.0)
HEMOGLOBIN: 8.3 g/dL — AB (ref 12.0–15.0)
Hemoglobin: 8.3 g/dL — ABNORMAL LOW (ref 12.0–15.0)
MCH: 28.4 pg (ref 26.0–34.0)
MCH: 28.4 pg (ref 26.0–34.0)
MCHC: 32.3 g/dL (ref 30.0–36.0)
MCHC: 32.3 g/dL (ref 30.0–36.0)
MCV: 88 fL (ref 78.0–100.0)
MCV: 88 fL (ref 78.0–100.0)
PLATELETS: 287 10*3/uL (ref 150–400)
Platelets: 303 10*3/uL (ref 150–400)
RBC: 2.92 MIL/uL — ABNORMAL LOW (ref 3.87–5.11)
RBC: 2.92 MIL/uL — ABNORMAL LOW (ref 3.87–5.11)
RDW: 16.4 % — AB (ref 11.5–15.5)
RDW: 16.6 % — AB (ref 11.5–15.5)
WBC: 9.3 10*3/uL (ref 4.0–10.5)
WBC: 10.3 10*3/uL (ref 4.0–10.5)

## 2014-11-30 LAB — IRON AND TIBC
IRON: 10 ug/dL — AB (ref 28–170)
Saturation Ratios: 3 % — ABNORMAL LOW (ref 10.4–31.8)
TIBC: 312 ug/dL (ref 250–450)
UIBC: 302 ug/dL

## 2014-11-30 LAB — GLUCOSE, CAPILLARY
GLUCOSE-CAPILLARY: 203 mg/dL — AB (ref 65–99)
Glucose-Capillary: 160 mg/dL — ABNORMAL HIGH (ref 65–99)
Glucose-Capillary: 225 mg/dL — ABNORMAL HIGH (ref 65–99)
Glucose-Capillary: 201 mg/dL — ABNORMAL HIGH (ref 65–99)

## 2014-11-30 LAB — RETICULOCYTES
RBC.: 2.97 MIL/uL — ABNORMAL LOW (ref 3.87–5.11)
Retic Count, Absolute: 228.7 10*3/uL — ABNORMAL HIGH (ref 19.0–186.0)
Retic Ct Pct: 7.7 % — ABNORMAL HIGH (ref 0.4–3.1)

## 2014-11-30 LAB — OCCULT BLOOD X 1 CARD TO LAB, STOOL: Fecal Occult Bld: NEGATIVE

## 2014-11-30 LAB — MAGNESIUM: Magnesium: 2.3 mg/dL (ref 1.7–2.4)

## 2014-11-30 LAB — FERRITIN: Ferritin: 110 ng/mL (ref 11–307)

## 2014-11-30 LAB — TROPONIN I: Troponin I: <0.03 ng/mL (ref <0.031)

## 2014-11-30 LAB — VITAMIN B12: Vitamin B-12: 797 pg/mL (ref 180–914)

## 2014-11-30 LAB — PHOSPHORUS: Phosphorus: 4 mg/dL (ref 2.5–4.6)

## 2014-11-30 MED ORDER — FENOFIBRATE 160 MG PO TABS
160.0000 mg | ORAL_TABLET | Freq: Every day | ORAL | Status: DC
  Administered 2014-11-30 – 2014-12-02 (×3): 160 mg via ORAL
  Filled 2014-11-30 (×3): qty 1

## 2014-11-30 MED ORDER — FUROSEMIDE 10 MG/ML IJ SOLN
80.0000 mg | Freq: Two times a day (BID) | INTRAMUSCULAR | Status: DC
  Administered 2014-11-30 (×2): 80 mg via INTRAVENOUS
  Filled 2014-11-30 (×4): qty 8

## 2014-11-30 MED ORDER — CEFTRIAXONE SODIUM IN DEXTROSE 20 MG/ML IV SOLN
1.0000 g | INTRAVENOUS | Status: DC
  Administered 2014-11-30 – 2014-12-01 (×2): 1 g via INTRAVENOUS
  Filled 2014-11-30 (×3): qty 50

## 2014-11-30 MED ORDER — FUROSEMIDE 10 MG/ML IJ SOLN
40.0000 mg | Freq: Once | INTRAMUSCULAR | Status: AC
  Administered 2014-11-30: 40 mg via INTRAVENOUS
  Filled 2014-11-30 (×2): qty 4

## 2014-11-30 NOTE — Progress Notes (Addendum)
Subjective: Increased oxygen requirement and confusion last night. This morning, she reports she is feeling better. Feels less confused. Denies chest pain. Dyspnea improved as well. Objective: Vital signs in last 24 hours: Filed Vitals:   11/30/14 0118 11/30/14 0219 11/30/14 0357 11/30/14 0508  BP: 113/39   119/48  Pulse: 68   76  Temp: 100.1 F (37.8 C) 99.9 F (37.7 C) 99 F (37.2 C) 98.4 F (36.9 C)  TempSrc: Oral Oral Oral Oral  Resp: 22   24  Height:      Weight:    163 lb 9.6 oz (74.208 kg)  SpO2: 97%   97%   Weight change:   Intake/Output Summary (Last 24 hours) at 11/30/14 1317 Last data filed at 11/30/14 1255  Gross per 24 hour  Intake 479.08 ml  Output   1950 ml  Net -1470.92 ml   General Apperance: NAD HEENT: Normocephalic, atraumatic, PERRL, EOMI, anicteric sclera Neck: Supple, trachea midline Lungs: Bibasilar rales. No wheezes or rhonchi. Breathing comfortably Heart: Regular rate and rhythm, no murmur/rub/gallop Abdomen: Soft, nontender, nondistended, no rebound/guarding Extremities: Normal, atraumatic, warm and well perfused, 2+ BLE edema Pulses: 2+ throughout Skin: No rashes or lesions Neurologic: Alert and oriented x 2. CNII-XII intact. Normal strength and sensation  Lab Results: Basic Metabolic Panel:  Recent Labs Lab 11/29/14 1433 11/29/14 1558  NA  --  140  K  --  4.0  CL  --  104  CO2  --  26  GLUCOSE  --  189*  BUN  --  77*  CREATININE 2.40* 2.36*  CALCIUM  --  8.9   Liver Function Tests:  Recent Labs Lab 11/29/14 1558  AST 12*  ALT 15  ALKPHOS 81  BILITOT 0.7  PROT 5.5*  ALBUMIN 2.7*   CBC:  Recent Labs Lab 11/29/14 1420 11/29/14 1558 11/30/14 0631  WBC 9.9 9.6 9.3  NEUTROABS 8.4*  --   --   HGB 6.0* 5.7* 8.3*  HCT 19.2* 18.0* 25.7*  MCV 87.3 87.8 88.0  PLT 310 286 287   Cardiac Enzymes:  Recent Labs Lab 11/30/14 0028 11/30/14 0631  TROPONINI <0.03 <0.03   CBG:  Recent Labs Lab 11/29/14 2227  11/30/14 0753 11/30/14 1219  GLUCAP 290* 203* 160*   Hemoglobin A1C: No results for input(s): HGBA1C in the last 168 hours. Fasting Lipid Panel: No results for input(s): CHOL, HDL, LDLCALC, TRIG, CHOLHDL, LDLDIRECT in the last 168 hours. Anemia Panel: No results for input(s): VITAMINB12, FOLATE, FERRITIN, TIBC, IRON, RETICCTPCT in the last 168 hours.   Urinalysis:  Recent Labs Lab 11/29/14 1736  COLORURINE YELLOW  LABSPEC 1.011  PHURINE 5.0  GLUCOSEU NEGATIVE  HGBUR NEGATIVE  BILIRUBINUR NEGATIVE  KETONESUR NEGATIVE  PROTEINUR 100*  UROBILINOGEN 0.2  NITRITE POSITIVE*  LEUKOCYTESUR LARGE*   Micro Results: No results found for this or any previous visit (from the past 240 hour(s)). Studies/Results: Dg Chest 2 View  11/29/2014   CLINICAL DATA:  Shortness of breath.  EXAM: CHEST  2 VIEW  COMPARISON:  11/03/2014 .  FINDINGS: Cardiomegaly with pulmonary vascular prominence and diffuse interstitial prominence with small right pleural effusion. These findings consistent with congestive heart failure .  IMPRESSION: Congestive heart failure with pulmonary interstitial edema and small right pleural effusion. Pulmonary interstitial edema has progressed from prior study of 11/03/2014.   Electronically Signed   By: Maisie Fus  Register   On: 11/29/2014 14:50   Medications: I have reviewed the patient's current medications. Scheduled Meds: .  atorvastatin  20 mg Oral QHS  . cefTRIAXone (ROCEPHIN)  IV  1 g Intravenous Q24H  . escitalopram  10 mg Oral Daily  . furosemide  80 mg Intravenous BID  . gabapentin  100 mg Oral q morning - 10a   And  . gabapentin  200 mg Oral QHS  . insulin aspart  0-5 Units Subcutaneous QHS  . insulin aspart  0-9 Units Subcutaneous TID WC  . pantoprazole sodium  40 mg Oral Daily  . sodium chloride  3 mL Intravenous Q12H   Continuous Infusions:  PRN Meds:.albuterol Assessment/Plan: Active Problems:   Anemia   Acute blood loss anemia   Pressure  ulcer  Symptomatic Anemia: Reports melena. Admission Hgb 5.7 and MCV 87.8. FOBT negative. She was transfused with 2 units pRBC in the ED. Baseline hgb around 10-11. Post transfusion hgb 8.3. Repeat FOBT negative. -Transfuse pRBC prn with goal Hgb above 7 -CBC in AM -Obtain coags -Iron, TIBC, ferritin, Vitamin B12, Folate, Retic, Smear -Protonix 40mg  daily -GI consulted, EGD tomorrow  Acute on Chronic Diastolic CHF: Her BNP is increased to 722.9 from previous 466.7 on 11/03/2014. Her weight was 165lb on admission. Her weight was 164lb on 11/22/2014 and 158 on 07/12/2014. She has 1200 uop yesterday and weight down to 163. Troponins negative. Repeat EKG unchanged. -Lasix 80mg  IV BID -Daily weights and strict I/O's -Unable to continue home ASA 81mg  daily because of her yellow dye allergy and hospital version of ASA 81mg  daily has this ingredient -Continue home Lipitor 20mg  QHH, fenofibrate 160mg  daily  AKI on CKD stage 3: Admission creatinine 2.36. Baseline creatinine around 1.2 to 1.4. She appears volume overloaded on exam. Likely cardiorenal in etiology.  -Plan as above -Continue to monitor  UTI: Intermittent confusion prior to admission as well as last night. Denies dysuria, suprapubic pain. Difficulty to assess increased urine frequency as she is on Lasix. UA with many bacteria, large leukocytes, positive nitrites, numerous WBC. WBC 9.9 but with elevated absolute neutrophils to 8.4. Also temperature of 100.1 last night. In a geriatric patient, this may be considered a fever.  -IV ceftriaxone started  COPD: On 2L home O2, albuterol prn. -Continue albuterol neb 2 puff q6hr prn  DM2 with peripheral neuropathy: Last Hgb A1c 8% on 06/09/2013 -Hold home glipizide 10mg  daily -Continue home gabapentin 100mg  qAM and 200mg  qPM.  -SSI -Hgb A1c  HTN: On amlodipine 10mg  daily, hydralazine 25mg  BID, isosorbide mononitrate 30mg  daily, Lopressor 12.5mg  BID. BP 118/45-145/53 on admission. -Hold home  medications for now  Anxiety: Continue home Lexapro 10mg  daily  Pressure Ulcer: 4 cm x 3 cm x 0.1 bilateral gluteal folds. WOC consulted. Appreciate recs.  FEN: HH/Carb mod, NPO after midnight  VTE ppx: SCDs  Dispo: Disposition is deferred at this time, awaiting improvement of current medical problems.  Anticipated discharge in approximately 1-2 day(s).   The patient does have a current PCP Elizabeth Palau, FNP) and does not need an [facility] hospital follow-up appointment after discharge.  The patient does not have transportation limitations that hinder transportation to clinic appointments.  .Services Needed at time of discharge: Y = Yes, Blank = No PT:   OT:   RN:   Equipment:   Other:     LOS: 1 day   Lora Paula, MD 11/30/2014, 1:17 PM

## 2014-11-30 NOTE — Progress Notes (Signed)
Patient seen and examined. Case d/w residents in detail.  HPI: In brief, patient is an 81 y/o female with PMH of chronic diastolic CHF, COPD on home O2, HTN, DM who p/w worsening dyspnea on exertion. Patient states that over the past week she has noted progressive DOE and now is mildly dyspneic even at rest. She was recently seen by her cardiologist on 6/28 and had lasix increased to 40 mg BID form 20 mg BID with no improvement in symptoms.  She also notes increased O2 use at home. No CP, no fevers, no abd pain, no dysuria, no palpitations, no diaphoresis, no HA, no visual changes, no n/v, no diarrhea. Of note patient did note dark colored stool over the past week and also complained of mild epistaxis.  Physical Exam: Gen: AAO*3, NAD. Patient does get intermittently confused CVS: RRR, normal heart sounds Lungs: bibasilar crackles + Abd: soft, non tender, BS + Ext: b/l LE edema +  Assessment and Plan: 81 y/o female with progressive dyspnea likely secondary to acute on chronic diastolic CHF exacerbation given worsening pulm edema on CXR as well as anemia contributing to the dyspnea.   Symptomatic anemia: - CBC stable post transfusion - GI f/u appreciated. Patient scheduled for EGD in the AM - c/w PPI - FOBT negative. Will check irom studies and ferritin - Will monitor CBC  Acute on chronic diastolic CHF exacerbation: - c/w lasix 80 mg BID for now - patient is negative approx 720 ml yesterday. Continue to monitor I's and O's, daily weights - Troponins negative. EKG with no acute changes - Will monitor  UTI: - c/w ceftriaxone for now - F/u urine cx - Even though patient with no urinary symptoms she does have episodes of confusion/delirium which may be secondary to UTI  AKI on CKD: - Will monitor BMP - Possibly cradio renal given acute CHF exacerbation - Will c/w diuresis for now

## 2014-11-30 NOTE — Progress Notes (Signed)
Asked to dose ceftriaxone for UTI.  Ceftriaxone requires no adjustment for renal function. A new P&T measure that was passed gave pharmacy the authority to adjust ceftriaxone dosing based on indication at order verification. Therefore, a formal consult is no longer needed for ceftriaxone.  Plan: -ceftriaxone 1g IV q24h for UTI -recommend 5 days of treatment for uncomplicated UTI -pharmacy will sign off  Jandi Swiger D. Esvin Hnat, PharmD, BCPS Clinical Pharmacist Pager: [phone] 11/30/2014 10:58 AM

## 2014-11-30 NOTE — Progress Notes (Signed)
Notified by RN that patient was confused and trying to get out of bed.  On evaluation, patient sitting on side of bed with Venti mask in place.  She was oriented to person, but not place or time.  She did not remember why she was in the hospital.  She was tired of lying in bed and wanted to get up and walk.  She was redirectable and agreed to stay in the bed or chair, and only ambulate with assistance. She talked with her daughter on the phone who will be coming in today. She denies pain.  Filed Vitals:   11/30/14 0508  BP: 119/48  Pulse: 76  Temp: 98.4 F (36.9 C)  Resp: 24   Gen: Elderly female, sitting in bed, NAD.  Neuro: Oriented to person, not place or time. CV: RRR Pulm: B/l crackles  A/P: 2nd unit pRBCs finished running at 0445. Will defer sitter at this time.   - Recheck CBC and Troponin

## 2014-11-30 NOTE — Progress Notes (Signed)
Patient evaluated after receiving first unit pRBCs.  She endorses subjective improvement, saying she feels better.  She is still light headed if she gets up.  She denies fever, chills, chest pain, or shortness of breath.  PE: Filed Vitals:   11/29/14 2139  BP: 131/49  Pulse: 82  Temp: 99.1 F (37.3 C)  Resp: 22   Gen: Lying in bed in NAD CV: RRR. Pulm: B/l crackles Ext: Trace peripheral edema.  A/P: Patient symptoms improved with first unit pRBCs.  Second unit pRBCs being prepared.   - Lasix 40 mg IV - Phlebotomy unable to draw 20:00 Troponin while blood is running.  Will check Troponin before and after second unit.

## 2014-11-30 NOTE — Consult Note (Signed)
                                                                           [facility] Consult: 11:57 AM 11/30/2014  LOS: 1 day    Referring Provider: Narendra, MD  Primary Care Physician:  ANDERSON,TERESA, FNP Primary Gastroenterologist:  unassigned     Reason for Consultation:  normocytic anemia.    HPI: Alexis [name] is a 82 y.o. female.  Hx cardiomyopathy/CHF but Echo EF of 65 to 75% with mild increase pulm artery pressures 05/2013. COPD, requiring 2 liters nocturnal [place] O2 at baseline. Remote colonoscopy of which she recalls no details, by Dr Stafford (so ? Flex sig).  No endoscopy or pathology reports in Epic. Transfused for hip surgery/fracture related anemia in 2004.   Daughter notes worsening exertional weakness beginning 2 months ago.  Early last week, for about 4 days, had black and formed BMs. After that stools resumed formed and brown. No anorexia or early satiety, no pyrosis.  DOE/weakness accelerated significantly in the last week. Hardly able to walk around and using her [place] oxygen 24/7 at higher flow volume.  Brought to ED yesterday and Hgb of 5.7, MCV normal. Now 8.3 after 2 PRBCs. Weakness better but still needing Oxygen by mask.  CXR shows CHF, pulm edema, small right pleural effusion.  Started on oral Protonix once daily.  No use of PPI etc PTA.   Not using ASA, NSAIDS.  No ETOH.  No issues with derm or significant purpura/bruising.  Has seen streaks of blood when blowing her noise but no true epistaxis. No change in LE edema but does feel like she's acculmulating fluid. Weight is actually down 10# c/w one month ago. No ETOH ever.  Dad died due to advanced colon cancer at age 80.      Past Medical History  Diagnosis Date  . Hypertension   . Neuropathy   . Anxiety   . PONV (postoperative nausea and vomiting)   . CHF (congestive heart failure)   . COPD  (chronic obstructive pulmonary disease)     on home, nocturnal oxygen.   . Family history of adverse reaction to anesthesia   . Cardiomyopathy   . Diabetes mellitus without complication     TYPE 2  . Macular degeneration, bilateral     Past Surgical History  Procedure Laterality Date  . Hip fracture surgery  2004  . Abdominal hysterectomy    . Vein surgery      Prior to Admission medications   Medication Sig Start Date End Date Taking? Authorizing Provider  acetaminophen (TYLENOL) 325 MG tablet Take 650 mg by mouth every 6 (six) hours as needed for mild pain.    Yes Historical Provider, MD  amLODipine (NORVASC) 10 MG tablet Take 10 mg by mouth daily.   Yes Historical Provider, MD  aspirin EC 81 MG tablet Take 81 mg by mouth at bedtime.   Yes Historical Provider, MD  atorvastatin (LIPITOR) 20 MG tablet Take 20 mg by mouth at bedtime. 10/12/14 10/12/15 Yes Historical Provider, MD  cholecalciferol (VITAMIN D) 1000 UNITS tablet Take 1,000 Units by mouth at bedtime.    Yes Historical Provider, MD  escitalopram (LEXAPRO) 10 MG   tablet Take 10 mg by mouth daily.   Yes Historical Provider, MD  fenofibrate 160 MG tablet Take 160 mg by mouth daily.   Yes Historical Provider, MD  furosemide (LASIX) 40 MG tablet Take 1 tablet (40 mg total) by mouth 2 (two) times daily. 11/21/14  Yes Kenneth C Hilty, MD  gabapentin (NEURONTIN) 100 MG capsule Take 100-200 mg by mouth 2 (two) times daily. Take 1 capsule (100 mg) morning, take 2 capsules (200 mg) at bedtime   Yes Historical Provider, MD  glipiZIDE (GLUCOTROL XL) 10 MG 24 hr tablet Take 10 mg by mouth daily. 10/11/14  Yes Historical Provider, MD  hydrALAZINE (APRESOLINE) 25 MG tablet Take 1 tablet (25 mg total) by mouth 2 (two) times daily. 10/03/14  Yes Kenneth C Hilty, MD  isosorbide mononitrate (IMDUR) 30 MG 24 hr tablet Take 1 tablet (30 mg total) by mouth daily. 10/03/14  Yes Kenneth C Hilty, MD  metoprolol tartrate (LOPRESSOR) 12.5 mg TABS tablet Take 0.5  tablets (12.5 mg total) by mouth 2 (two) times daily. 05/14/14  Yes Carlos Madera, MD  Multiple Vitamin (MULTIVITAMIN WITH MINERALS) TABS tablet Take 1 tablet by mouth daily.   Yes Historical Provider, MD  OXYGEN Inhale into the lungs.   Yes Historical Provider, MD  Pramoxine-Menthol (GOLD BOND MAXIMUM RELIEF) 1-1 % CREA Apply 1 application topically 2 (two) times daily as needed (for would care).   Yes Historical Provider, MD  predniSONE (DELTASONE) 20 MG tablet Take 1 tablet (20 mg total) by mouth daily. 11/04/14  Yes Joe Mintz, PA-C  albuterol (PROVENTIL HFA;VENTOLIN HFA) 108 (90 BASE) MCG/ACT inhaler Inhale 2 puffs into the lungs every 6 (six) hours as needed for wheezing or shortness of breath. 06/11/13   Nishant Dhungel, MD    Scheduled Meds: . atorvastatin  20 mg Oral QHS  . cefTRIAXone (ROCEPHIN)  IV  1 g Intravenous Q24H  . escitalopram  10 mg Oral Daily  . furosemide  80 mg Intravenous BID  . gabapentin  100 mg Oral q morning - 10a   And  . gabapentin  200 mg Oral QHS  . insulin aspart  0-5 Units Subcutaneous QHS  . insulin aspart  0-9 Units Subcutaneous TID WC  . pantoprazole sodium  40 mg Oral Daily  . sodium chloride  3 mL Intravenous Q12H   Infusions:   PRN Meds: albuterol   Allergies as of 11/29/2014 - Review Complete 11/29/2014  Allergen Reaction Noted  . Other  06/09/2013  . Codeine Hives 06/09/2013  . Penicillins Hives 06/09/2013  . Procaine Hives 07/15/2013  . Yellow dyes (non-tartrazine) Nausea And Vomiting 07/21/2013    Family History  Problem Relation Age of Onset  . Colon cancer Father 80    advanced when discovered, no surgery or therapy.  this led to his death    History   Social History  . Marital Status: Widowed    Spouse Name: N/A  . Number of Children: N/A  . Years of Education: N/A   Occupational History  . Not on file.   Social History Main Topics  . Smoking status: Never Smoker   . Smokeless tobacco: Never Used  . Alcohol Use: No    . Drug Use: No  . Sexual Activity: Not on file   Other Topics Concern  . Not on file   Social History Narrative    REVIEW OF SYSTEMS: Constitutional:  Per HPI ENT:  No nose bleeds.  + HOH but tends not to use   her hearing aids.  Pulm:  Per HPI.  No cough.  CV:  No palpitations, no LE edema. No chest pain.   GU:  No hematuria, no frequency GI:  Per HPI.  No hx liver disease or elevated LFTs.  Heme:  Per HPI   Transfusions:  Per HPI Neuro:  No headaches, no peripheral tingling or numbness Derm:  No itching, no rash or sores.  Endocrine:  No sweats or chills.  No polyuria or dysuria Immunization:  Did not inquire.  Flu vax 01/2014. Travel:  None beyond local counties in last few months.    PHYSICAL EXAM: Vital signs in last 24 hours: Filed Vitals:   11/30/14 0508  BP: 119/48  Pulse: 76  Temp: 98.4 F (36.9 C)  Resp: 24   Wt Readings from Last 3 Encounters:  11/30/14 163 lb 9.6 oz (74.208 kg)  11/21/14 164 lb 1.6 oz (74.435 kg)  11/03/14 173 lb (78.472 kg)   General: tired, somewhat pale, elderly WF.   Head:  No swelling or asymmetry  Eyes:  No icterus or conj pallor Ears:  HOH, hearing aids not in place.   Nose:  No congestion or discharge Mouth:  Dry oral MM. No blood or sores  Neck:  No JVD or masses.  No TMG Lungs:  Significantly reduced bil.  No adventitious sounds.  Face mask oxygen in place but still dyspneic when speaking. Heart: RRR.  No mrg.  S2/S2 audible. Abdomen:  Soft, NT, ND.  Small umbilical hernia.  No mass or tenderness.  No HSM.   Rectal: deferred.  Brwon, FOBT negative stool as of last night.    Musc/Skeltl: no joint swelling, redness, deformity.   Extremities:  bil edema, 2 +  Neurologic:  Oriented x 3.  HOH.  Moves all 4s, no tremors or asterixis.   Skin:  No telangectasia.  No significant purpura or bruising.  Lots or seb keratosis on her back. Tattoos:  none Nodes:  No cervical or inguinal adenopathy.    Psych:  Pleasant, cooperative.   Affect subdued c/w illness.   Intake/Output from previous day: 07/05 0701 - 07/06 0700 In: 479.1 [I.V.:3; Blood:476.1] Out: 1200 [Urine:1200] Intake/Output this shift: Total I/O In: -  Out: 550 [Urine:550]  LAB RESULTS:  Recent Labs  11/29/14 1420 11/29/14 1558 11/30/14 0631  WBC 9.9 9.6 9.3  HGB 6.0* 5.7* 8.3*  HCT 19.2* 18.0* 25.7*  PLT 310 286 287   BMET Lab Results  Component Value Date   NA 140 11/29/2014   NA 140 11/03/2014   NA 139 05/13/2014   K 4.0 11/29/2014   K 4.2 11/03/2014   K 4.5 05/13/2014   CL 104 11/29/2014   CL 103 11/03/2014   CL 98 05/13/2014   CO2 26 11/29/2014   CO2 24 11/03/2014   CO2 33* 05/13/2014   GLUCOSE 189* 11/29/2014   GLUCOSE 271* 11/03/2014   GLUCOSE 264* 05/13/2014   BUN 77* 11/29/2014   BUN 42* 11/03/2014   BUN 30* 05/13/2014   CREATININE 2.36* 11/29/2014   CREATININE 2.40* 11/29/2014   CREATININE 1.75* 11/03/2014   CALCIUM 8.9 11/29/2014   CALCIUM 8.7* 11/03/2014   CALCIUM 9.2 05/13/2014   LFT  Recent Labs  11/29/14 1558  PROT 5.5*  ALBUMIN 2.7*  AST 12*  ALT 15  ALKPHOS 81  BILITOT 0.7   PT/INR Lab Results  Component Value Date   INR 0.97 04/23/2009   INR 0.99 04/12/2009    RADIOLOGY STUDIES: Dg Chest   2 View  11/29/2014   CLINICAL DATA:  Shortness of breath.  EXAM: CHEST  2 VIEW  COMPARISON:  11/03/2014 .  FINDINGS: Cardiomegaly with pulmonary vascular prominence and diffuse interstitial prominence with small right pleural effusion. These findings consistent with congestive heart failure .  IMPRESSION: Congestive heart failure with pulmonary interstitial edema and small right pleural effusion. Pulmonary interstitial edema has progressed from prior study of 11/03/2014.   Electronically Signed   By: Thomas  Register   On: 11/29/2014 14:50    ENDOSCOPIC STUDIES: > 10 yrs ago, flex vs colonoscopy  IMPRESSION:   *  Normocytic anemia. Rule out ulcer, AVMs, neoplasia.  *  + family hx colon cancer in dad  age 90.  At present her resp status looks too tenuous for anything other than EGD.  Not clear if colon evaluation clinically relevant. Certainly risky.   *  CHF, COPD.   *  UTI?.  On Rocephin.   No sxs of UTI, no WBC elevation, no fever thus not meeting IDs suggested criteria for UTI.  Would stop abx.  See attached below from  [facility] Email   *  DM2.    PLAN:     *  Set up for EGD with MAC at 2 PM tomorrow.  Hopefully resp status will allow this to proceed. Continue oral Protonix.   *  Ok to eat today, ordered HH/carb mod diet.    Sarah Gribbin  11/30/2014, 11:57 AM Pager: [phone]  GI ATTENDING  History, laboratories, x-rays reviewed. Patient personally seen and examined. Multiple family members in room. Agree with consult note as outlined above. Elderly female with multiple multiple significant medical problems. Limited functional status at home confirmed by family. On continuous oxygen. Now with melena and heart failure. Receiving diuretic's. Has been transfused. Significant anemia noted in has contributed to fatigue. Question about GI bleeding. No iron deficiency or microcytosis and stool Hemoccult-negative. Reasonable to perform upper endoscopy with history of "dark stool". Will plan for tomorrow afternoon with monitored anesthesia care. The patient is high risk.The nature of the procedure, as well as the risks, benefits, and alternatives were carefully and thoroughly reviewed with the patient. Ample time for discussion and questions allowed. The patient understood, was satisfied, and agreed to proceed.. I do not think that she is a candidate for colonoscopy nor when she be a reasonable candidate for Gen. surgery should pathology be found. If upper endoscopy unrevealing, would recommend transfusion to clinically appropriate hemoglobin and empiric iron therapy. Also evaluate for other causes of anemia, i.e. B12 folate. Thank you  Anishka Bushard N. Jamar Casagrande, Jr., M.D. [facility]  [facility]enter[facility]      

## 2014-11-30 NOTE — Care Management Note (Signed)
Case Management Note  Patient Details  Name: NATACHA IDEKER MRN: [id number] Date of Birth: 1932/03/17  Subjective/Objective:     Patient is from home with son, NCM will cont to follow for dc needs. Patient may need pt eval.               Action/Plan:   Expected Discharge Date:                  Expected Discharge Plan:  Home w Home Health Services  In-House Referral:     Discharge planning Services  CM Consult  Post Acute Care Choice:    Choice offered to:     DME Arranged:    DME Agency:     HH Arranged:    HH Agency:     Status of Service:  In process, will continue to follow  Medicare Important Message Given:    Date Medicare IM Given:    Medicare IM give by:    Date Additional Medicare IM Given:    Additional Medicare Important Message give by:     If discussed at Long Length of Stay Meetings, dates discussed:    Additional Comments:  Leone Haven, RN 11/30/2014, 3:50 PM

## 2014-11-30 NOTE — Consult Note (Signed)
WOC wound consult note Reason for Consult: Moisture Associated Skin Damage to bilateral gluteal folds.  Nonblanchable redness to coccyx. Patient is incontinent and sleeps in recliner at night for comfort after hip replacement surgery 11 years ago, per daughter.  Wound type:Moisture Associated Skin Damage from incontinence in conjunction with pressure. Stage II pressure ulcer Pressure Ulcer POA: Yes Measurement:4 cm x 3 cm x 0.1 bilateral gluteal folds.  (non intact area is 0.5 cm x 0.5 cm x 0.1 cm) Coccyx is nonblanchable redness Wound bed:100% pink and moist.  Drainage (amount, consistency, odor) None noted.  Periwound:Erythema Dressing procedure/placement/frequency:Cleanse buttocks daily with soap and water and PRN incontinence.  Apply barrier cream daily and PRN incontinence.  Will not follow at this time.  Please re-consult if needed.  Maple Hudson RN BSN [facility]ON Pager [phone]

## 2014-11-30 NOTE — Progress Notes (Signed)
Utilization review completed.  

## 2014-12-01 ENCOUNTER — Inpatient Hospital Stay (HOSPITAL_COMMUNITY): Payer: Medicare Other | Admitting: Anesthesiology

## 2014-12-01 ENCOUNTER — Encounter (HOSPITAL_COMMUNITY)
Admission: EM | Disposition: A | Discharge status: Home / Self Care | Payer: Self-pay | Source: Home / Self Care | Attending: Internal Medicine

## 2014-12-01 ENCOUNTER — Inpatient Hospital Stay (HOSPITAL_COMMUNITY): Payer: Medicare Other

## 2014-12-01 ENCOUNTER — Other Ambulatory Visit (HOSPITAL_COMMUNITY): Payer: Medicare Other

## 2014-12-01 ENCOUNTER — Encounter (HOSPITAL_COMMUNITY): Payer: Self-pay | Admitting: *Deleted

## 2014-12-01 DIAGNOSIS — K921 Melena: Secondary | ICD-10-CM | POA: Insufficient documentation

## 2014-12-01 DIAGNOSIS — I5033 Acute on chronic diastolic (congestive) heart failure: Principal | ICD-10-CM

## 2014-12-01 DIAGNOSIS — I480 Paroxysmal atrial fibrillation: Secondary | ICD-10-CM

## 2014-12-01 DIAGNOSIS — N183 Chronic kidney disease, stage 3 (moderate): Secondary | ICD-10-CM

## 2014-12-01 DIAGNOSIS — R0600 Dyspnea, unspecified: Secondary | ICD-10-CM

## 2014-12-01 HISTORY — PX: ESOPHAGOGASTRODUODENOSCOPY: SHX5428

## 2014-12-01 LAB — BASIC METABOLIC PANEL
ANION GAP: 12 (ref 5–15)
BUN: 61 mg/dL — ABNORMAL HIGH (ref 6–20)
CO2: 30 mmol/L (ref 22–32)
Calcium: 8.5 mg/dL — ABNORMAL LOW (ref 8.9–10.3)
Chloride: 102 mmol/L (ref 101–111)
Creatinine, Ser: 1.83 mg/dL — ABNORMAL HIGH (ref 0.44–1.00)
GFR calc Af Amer: 29 mL/min — ABNORMAL LOW (ref >60)
GFR calc non Af Amer: 25 mL/min — ABNORMAL LOW (ref >60)
Glucose, Bld: 200 mg/dL — ABNORMAL HIGH (ref 65–99)
POTASSIUM: 3 mmol/L — AB (ref 3.5–5.1)
SODIUM: 144 mmol/L (ref 135–145)

## 2014-12-01 LAB — TYPE AND SCREEN
ABO/RH(D): O POS
Antibody Screen: NEGATIVE
UNIT DIVISION: 0
Unit division: 0

## 2014-12-01 LAB — GLUCOSE, CAPILLARY
GLUCOSE-CAPILLARY: 275 mg/dL — AB (ref 65–99)
Glucose-Capillary: 159 mg/dL — ABNORMAL HIGH (ref 65–99)
Glucose-Capillary: 106 mg/dL — ABNORMAL HIGH (ref 65–99)
Glucose-Capillary: 125 mg/dL — ABNORMAL HIGH (ref 65–99)

## 2014-12-01 LAB — CBC
HEMATOCRIT: 23.7 % — AB (ref 36.0–46.0)
Hemoglobin: 7.5 g/dL — ABNORMAL LOW (ref 12.0–15.0)
MCH: 27.9 pg (ref 26.0–34.0)
MCHC: 31.6 g/dL (ref 30.0–36.0)
MCV: 88.1 fL (ref 78.0–100.0)
PLATELETS: 285 10*3/uL (ref 150–400)
RBC: 2.69 MIL/uL — AB (ref 3.87–5.11)
RDW: 16.9 % — ABNORMAL HIGH (ref 11.5–15.5)
WBC: 8.5 10*3/uL (ref 4.0–10.5)

## 2014-12-01 LAB — PROTIME-INR
INR: 1.33 (ref 0.00–1.49)
INR: 1.34 (ref 0.00–1.49)
PROTHROMBIN TIME: 16.6 s — AB (ref 11.6–15.2)
PROTHROMBIN TIME: 16.7 s — AB (ref 11.6–15.2)

## 2014-12-01 LAB — HEMOGLOBIN A1C
Hgb A1c MFr Bld: 7.7 % — ABNORMAL HIGH (ref 4.8–5.6)
Mean Plasma Glucose: 174 mg/dL

## 2014-12-01 SURGERY — EGD (ESOPHAGOGASTRODUODENOSCOPY)
Anesthesia: Monitor Anesthesia Care

## 2014-12-01 MED ORDER — METOPROLOL TARTRATE 25 MG PO TABS
25.0000 mg | ORAL_TABLET | Freq: Two times a day (BID) | ORAL | Status: DC
  Administered 2014-12-01 – 2014-12-02 (×2): 25 mg via ORAL
  Filled 2014-12-01 (×3): qty 1

## 2014-12-01 MED ORDER — SODIUM CHLORIDE 0.9 % IV SOLN
INTRAVENOUS | Status: DC | PRN
  Administered 2014-12-01: 14:00:00 via INTRAVENOUS

## 2014-12-01 MED ORDER — SODIUM CHLORIDE 0.9 % IV SOLN
INTRAVENOUS | Status: DC
  Administered 2014-12-01: 10:00:00 via INTRAVENOUS

## 2014-12-01 MED ORDER — METOPROLOL TARTRATE 12.5 MG HALF TABLET
12.5000 mg | ORAL_TABLET | Freq: Two times a day (BID) | ORAL | Status: DC
  Administered 2014-12-01: 12.5 mg via ORAL
  Filled 2014-12-01: qty 1

## 2014-12-01 MED ORDER — LACTATED RINGERS IV SOLN
INTRAVENOUS | Status: DC
  Administered 2014-12-01: 1000 mL via INTRAVENOUS

## 2014-12-01 MED ORDER — POTASSIUM CHLORIDE CRYS ER 20 MEQ PO TBCR
40.0000 meq | EXTENDED_RELEASE_TABLET | Freq: Once | ORAL | Status: AC
  Administered 2014-12-01: 40 meq via ORAL
  Filled 2014-12-01: qty 2

## 2014-12-01 MED ORDER — FUROSEMIDE 40 MG PO TABS
40.0000 mg | ORAL_TABLET | Freq: Two times a day (BID) | ORAL | Status: DC
  Administered 2014-12-01 – 2014-12-02 (×3): 40 mg via ORAL
  Filled 2014-12-01 (×5): qty 1

## 2014-12-01 MED ORDER — PROPOFOL INFUSION 10 MG/ML OPTIME
INTRAVENOUS | Status: DC | PRN
  Administered 2014-12-01: 50 ug/kg/min via INTRAVENOUS

## 2014-12-01 NOTE — Care Management (Signed)
Important Message  Patient Details  Name: Alexis [name] MRN: [id number] Date of Birth: [birth year]   Medicare Important Message Given:  Yes-second notification given    Orson Aloe 12/01/2014, 2:36 PM

## 2014-12-01 NOTE — H&P (View-Only) (Signed)
[facility] Consult: 11:57 AM 11/30/2014  LOS: 1 day    Referring Provider: Heide Spark, MD  Primary Care Physician:  Elizabeth Palau, FNP Primary Gastroenterologist:  unassigned     Reason for Consultation:  normocytic anemia.    HPI: Alexis [name] is a 81 y.o. female.  Hx cardiomyopathy/CHF but Echo EF of 65 to 75% with mild increase pulm artery pressures 05/2013. COPD, requiring 2 liters nocturnal [place] O2 at baseline. Remote colonoscopy of which she recalls no details, by Dr Scotty Court (so ? Flex sig).  No endoscopy or pathology reports in Epic. Transfused for hip surgery/fracture related anemia in 2004.   Daughter notes worsening exertional weakness beginning 2 months ago.  Early last week, for about 4 days, had black and formed BMs. After that stools resumed formed and brown. No anorexia or early satiety, no pyrosis.  DOE/weakness accelerated significantly in the last week. Hardly able to walk around and using her [place] oxygen 24/7 at higher flow volume.  Brought to ED yesterday and Hgb of 5.7, MCV normal. Now 8.3 after 2 PRBCs. Weakness better but still needing Oxygen by mask.  CXR shows CHF, pulm edema, small right pleural effusion.  Started on oral Protonix once daily.  No use of PPI etc PTA.   Not using ASA, NSAIDS.  No ETOH.  No issues with derm or significant purpura/bruising.  Has seen streaks of blood when blowing her noise but no true epistaxis. No change in LE edema but does feel like she's acculmulating fluid. Weight is actually down 10# c/w one month ago. No ETOH ever.  Dad died due to advanced colon cancer at age 25.      Past Medical History  Diagnosis Date  . Hypertension   . Neuropathy   . Anxiety   . PONV (postoperative nausea and vomiting)   . CHF (congestive heart failure)   . COPD  (chronic obstructive pulmonary disease)     on home, nocturnal oxygen.   . Family history of adverse reaction to anesthesia   . Cardiomyopathy   . Diabetes mellitus without complication     TYPE 2  . Macular degeneration, bilateral     Past Surgical History  Procedure Laterality Date  . Hip fracture surgery  2004  . Abdominal hysterectomy    . Vein surgery      Prior to Admission medications   Medication Sig Start Date End Date Taking? Authorizing Provider  acetaminophen (TYLENOL) 325 MG tablet Take 650 mg by mouth every 6 (six) hours as needed for mild pain.    Yes Historical Provider, MD  amLODipine (NORVASC) 10 MG tablet Take 10 mg by mouth daily.   Yes Historical Provider, MD  aspirin EC 81 MG tablet Take 81 mg by mouth at bedtime.   Yes Historical Provider, MD  atorvastatin (LIPITOR) 20 MG tablet Take 20 mg by mouth at bedtime. 10/12/14 10/12/15 Yes Historical Provider, MD  cholecalciferol (VITAMIN D) 1000 UNITS tablet Take 1,000 Units by mouth at bedtime.    Yes Historical Provider, MD  escitalopram (LEXAPRO) 10 MG  tablet Take 10 mg by mouth daily.   Yes Historical Provider, MD  fenofibrate 160 MG tablet Take 160 mg by mouth daily.   Yes Historical Provider, MD  furosemide (LASIX) 40 MG tablet Take 1 tablet (40 mg total) by mouth 2 (two) times daily. 11/21/14  Yes Chrystie Nose, MD  gabapentin (NEURONTIN) 100 MG capsule Take 100-200 mg by mouth 2 (two) times daily. Take 1 capsule (100 mg) morning, take 2 capsules (200 mg) at bedtime   Yes Historical Provider, MD  glipiZIDE (GLUCOTROL XL) 10 MG 24 hr tablet Take 10 mg by mouth daily. 10/11/14  Yes Historical Provider, MD  hydrALAZINE (APRESOLINE) 25 MG tablet Take 1 tablet (25 mg total) by mouth 2 (two) times daily. 10/03/14  Yes Chrystie Nose, MD  isosorbide mononitrate (IMDUR) 30 MG 24 hr tablet Take 1 tablet (30 mg total) by mouth daily. 10/03/14  Yes Chrystie Nose, MD  metoprolol tartrate (LOPRESSOR) 12.5 mg TABS tablet Take 0.5  tablets (12.5 mg total) by mouth 2 (two) times daily. 05/14/14  Yes Vassie Loll, MD  Multiple Vitamin (MULTIVITAMIN WITH MINERALS) TABS tablet Take 1 tablet by mouth daily.   Yes Historical Provider, MD  OXYGEN Inhale into the lungs.   Yes Historical Provider, MD  Pramoxine-Menthol (GOLD BOND MAXIMUM RELIEF) 1-1 % CREA Apply 1 application topically 2 (two) times daily as needed (for would care).   Yes Historical Provider, MD  predniSONE (DELTASONE) 20 MG tablet Take 1 tablet (20 mg total) by mouth daily. 11/04/14  Yes Ladona Mow, PA-C  albuterol (PROVENTIL HFA;VENTOLIN HFA) 108 (90 BASE) MCG/ACT inhaler Inhale 2 puffs into the lungs every 6 (six) hours as needed for wheezing or shortness of breath. 06/11/13   Nishant Dhungel, MD    Scheduled Meds: . atorvastatin  20 mg Oral QHS  . cefTRIAXone (ROCEPHIN)  IV  1 g Intravenous Q24H  . escitalopram  10 mg Oral Daily  . furosemide  80 mg Intravenous BID  . gabapentin  100 mg Oral q morning - 10a   And  . gabapentin  200 mg Oral QHS  . insulin aspart  0-5 Units Subcutaneous QHS  . insulin aspart  0-9 Units Subcutaneous TID WC  . pantoprazole sodium  40 mg Oral Daily  . sodium chloride  3 mL Intravenous Q12H   Infusions:   PRN Meds: albuterol   Allergies as of 11/29/2014 - Review Complete 11/29/2014  Allergen Reaction Noted  . Other  06/09/2013  . Codeine Hives 06/09/2013  . Penicillins Hives 06/09/2013  . Procaine Hives 07/15/2013  . Yellow dyes (non-tartrazine) Nausea And Vomiting 07/21/2013    Family History  Problem Relation Age of Onset  . Colon cancer Father 59    advanced when discovered, no surgery or therapy.  this led to his death    History   Social History  . Marital Status: Widowed    Spouse Name: N/A  . Number of Children: N/A  . Years of Education: N/A   Occupational History  . Not on file.   Social History Main Topics  . Smoking status: Never Smoker   . Smokeless tobacco: Never Used  . Alcohol Use: No    . Drug Use: No  . Sexual Activity: Not on file   Other Topics Concern  . Not on file   Social History Narrative    REVIEW OF SYSTEMS: Constitutional:  Per HPI ENT:  No nose bleeds.  + HOH but tends not to use  her hearing aids.  Pulm:  Per HPI.  No cough.  CV:  No palpitations, no LE edema. No chest pain.   GU:  No hematuria, no frequency GI:  Per HPI.  No hx liver disease or elevated LFTs.  Heme:  Per HPI   Transfusions:  Per HPI Neuro:  No headaches, no peripheral tingling or numbness Derm:  No itching, no rash or sores.  Endocrine:  No sweats or chills.  No polyuria or dysuria Immunization:  Did not inquire.  Flu vax 01/2014. Travel:  None beyond local counties in last few months.    PHYSICAL EXAM: Vital signs in last 24 hours: Filed Vitals:   11/30/14 0508  BP: 119/48  Pulse: 76  Temp: 98.4 F (36.9 C)  Resp: 24   Wt Readings from Last 3 Encounters:  11/30/14 163 lb 9.6 oz (74.208 kg)  11/21/14 164 lb 1.6 oz (74.435 kg)  11/03/14 173 lb (78.472 kg)   General: tired, somewhat pale, elderly WF.   Head:  No swelling or asymmetry  Eyes:  No icterus or conj pallor Ears:  HOH, hearing aids not in place.   Nose:  No congestion or discharge Mouth:  Dry oral MM. No blood or sores  Neck:  No JVD or masses.  No TMG Lungs:  Significantly reduced bil.  No adventitious sounds.  Face mask oxygen in place but still dyspneic when speaking. Heart: RRR.  No mrg.  S2/S2 audible. Abdomen:  Soft, NT, ND.  Small umbilical hernia.  No mass or tenderness.  No HSM.   Rectal: deferred.  Brwon, FOBT negative stool as of last night.    Musc/Skeltl: no joint swelling, redness, deformity.   Extremities:  bil edema, 2 +  Neurologic:  Oriented x 3.  HOH.  Moves all 4s, no tremors or asterixis.   Skin:  No telangectasia.  No significant purpura or bruising.  Lots or seb keratosis on her back. Tattoos:  none Nodes:  No cervical or inguinal adenopathy.    Psych:  Pleasant, cooperative.   Affect subdued c/w illness.   Intake/Output from previous day: 07/05 0701 - 07/06 0700 In: 479.1 [I.V.:3; Blood:476.1] Out: 1200 [Urine:1200] Intake/Output this shift: Total I/O In: -  Out: 550 [Urine:550]  LAB RESULTS:  Recent Labs  11/29/14 1420 11/29/14 1558 11/30/14 0631  WBC 9.9 9.6 9.3  HGB 6.0* 5.7* 8.3*  HCT 19.2* 18.0* 25.7*  PLT 310 286 287   BMET Lab Results  Component Value Date   NA 140 11/29/2014   NA 140 11/03/2014   NA 139 05/13/2014   K 4.0 11/29/2014   K 4.2 11/03/2014   K 4.5 05/13/2014   CL 104 11/29/2014   CL 103 11/03/2014   CL 98 05/13/2014   CO2 26 11/29/2014   CO2 24 11/03/2014   CO2 33* 05/13/2014   GLUCOSE 189* 11/29/2014   GLUCOSE 271* 11/03/2014   GLUCOSE 264* 05/13/2014   BUN 77* 11/29/2014   BUN 42* 11/03/2014   BUN 30* 05/13/2014   CREATININE 2.36* 11/29/2014   CREATININE 2.40* 11/29/2014   CREATININE 1.75* 11/03/2014   CALCIUM 8.9 11/29/2014   CALCIUM 8.7* 11/03/2014   CALCIUM 9.2 05/13/2014   LFT  Recent Labs  11/29/14 1558  PROT 5.5*  ALBUMIN 2.7*  AST 12*  ALT 15  ALKPHOS 81  BILITOT 0.7   PT/INR Lab Results  Component Value Date   INR 0.97 04/23/2009   INR 0.99 04/12/2009    RADIOLOGY STUDIES: Dg Chest  2 View  11/29/2014   CLINICAL DATA:  Shortness of breath.  EXAM: CHEST  2 VIEW  COMPARISON:  11/03/2014 .  FINDINGS: Cardiomegaly with pulmonary vascular prominence and diffuse interstitial prominence with small right pleural effusion. These findings consistent with congestive heart failure .  IMPRESSION: Congestive heart failure with pulmonary interstitial edema and small right pleural effusion. Pulmonary interstitial edema has progressed from prior study of 11/03/2014.   Electronically Signed   By: Maisie Fus  Register   On: 11/29/2014 14:50    ENDOSCOPIC STUDIES: > 10 yrs ago, flex vs colonoscopy  IMPRESSION:   *  Normocytic anemia. Rule out ulcer, AVMs, neoplasia.  *  + family hx colon cancer in dad  age 74.  At present her resp status looks too tenuous for anything other than EGD.  Not clear if colon evaluation clinically relevant. Certainly risky.   *  CHF, COPD.   *  UTI?Marland Kitchen  On Rocephin.   No sxs of UTI, no WBC elevation, no fever thus not meeting IDs suggested criteria for UTI.  Would stop abx.  See attached below from  [facility] Email   *  DM2.    PLAN:     *  Set up for EGD with MAC at 2 PM tomorrow.  Hopefully resp status will allow this to proceed. Continue oral Protonix.   *  Ok to eat today, ordered HH/carb mod diet.    Jennye Moccasin  11/30/2014, 11:57 AM Pager: [phone]  GI ATTENDING  History, laboratories, x-rays reviewed. Patient personally seen and examined. Multiple family members in room. Agree with consult note as outlined above. Elderly female with multiple multiple significant medical problems. Limited functional status at home confirmed by family. On continuous oxygen. Now with melena and heart failure. Receiving diuretic's. Has been transfused. Significant anemia noted in has contributed to fatigue. Question about GI bleeding. No iron deficiency or microcytosis and stool Hemoccult-negative. Reasonable to perform upper endoscopy with history of "dark stool". Will plan for tomorrow afternoon with monitored anesthesia care. The patient is high risk.The nature of the procedure, as well as the risks, benefits, and alternatives were carefully and thoroughly reviewed with the patient. Ample time for discussion and questions allowed. The patient understood, was satisfied, and agreed to proceed.. I do not think that she is a candidate for colonoscopy nor when she be a reasonable candidate for Gen. surgery should pathology be found. If upper endoscopy unrevealing, would recommend transfusion to clinically appropriate hemoglobin and empiric iron therapy. Also evaluate for other causes of anemia, i.e. B12 folate. Thank you  Wilhemina Bonito. Eda Keys., M.D. [facility] Division of  Gastroenterology

## 2014-12-01 NOTE — Progress Notes (Signed)
Subjective: She is more somnolent this morning but arousable. Oriented x 3 and answering questions appropriately. Doing well and reports improvement in her dyspnea. No further BM. Objective: Vital signs in last 24 hours: Filed Vitals:   11/30/14 1731 11/30/14 2114 11/30/14 2330 12/01/14 0536  BP: 152/52 176/56 148/51 135/48  Pulse:  103 99 94  Temp:  99.1 F (37.3 C)  99.1 F (37.3 C)  TempSrc:  Oral  Oral  Resp:  20  16  Height:      Weight:    157 lb 6.4 oz (71.396 kg)  SpO2:  92%  95%   Weight change: -8 lb 0.6 oz (-3.646 kg)  Intake/Output Summary (Last 24 hours) at 12/01/14 1247 Last data filed at 12/01/14 1016  Gross per 24 hour  Intake     50 ml  Output   2890 ml  Net  -2840 ml   General Apperance: NAD HEENT: Normocephalic, atraumatic, PERRL, EOMI, anicteric sclera Neck: Supple, trachea midline Lungs: Bibasilar rales. No wheezes or rhonchi. Breathing comfortably Heart: Regular rate and rhythm, no murmur/rub/gallop Abdomen: Soft, nontender, nondistended, no rebound/guarding Extremities: Normal, atraumatic, warm and well perfused, 2+ BLE edema Pulses: 2+ throughout Skin: No rashes or lesions Neurologic: Alert and oriented x 3. CNII-XII intact. Normal strength and sensation  Lab Results: Basic Metabolic Panel:  Recent Labs Lab 11/30/14 1512 12/01/14 0400  NA 141 144  K 3.5 3.0*  CL 102 102  CO2 28 30  GLUCOSE 256* 200*  BUN 68* 61*  CREATININE 1.95* 1.83*  CALCIUM 8.8* 8.5*  MG 2.3  --   PHOS 4.0  --    Liver Function Tests:  Recent Labs Lab 11/29/14 1558  AST 12*  ALT 15  ALKPHOS 81  BILITOT 0.7  PROT 5.5*  ALBUMIN 2.7*   CBC:  Recent Labs Lab 11/29/14 1420  11/30/14 1512 12/01/14 0400  WBC 9.9  < > 10.3 8.5  NEUTROABS 8.4*  --   --   --   HGB 6.0*  < > 8.3* 7.5*  HCT 19.2*  < > 25.7* 23.7*  MCV 87.3  < > 88.0 88.1  PLT 310  < > 303 285  < > = values in this interval not displayed. Cardiac Enzymes:  Recent Labs Lab  11/30/14 0028 11/30/14 0631  TROPONINI <0.03 <0.03   CBG:  Recent Labs Lab 11/30/14 0753 11/30/14 1219 11/30/14 1720 11/30/14 2112 12/01/14 0806 12/01/14 1206  GLUCAP 203* 160* 225* 201* 159* 106*   Hemoglobin A1C:  Recent Labs Lab 11/30/14 1610  HGBA1C 7.7*   Fasting Lipid Panel: No results for input(s): CHOL, HDL, LDLCALC, TRIG, CHOLHDL, LDLDIRECT in the last 168 hours. Anemia Panel:  Recent Labs Lab 11/30/14 1610  VITAMINB12 797  FOLATE 47.7  FERRITIN 110  TIBC 312  IRON 10*  RETICCTPCT 7.7*     Urinalysis:  Recent Labs Lab 11/29/14 1736  COLORURINE YELLOW  LABSPEC 1.011  PHURINE 5.0  GLUCOSEU NEGATIVE  HGBUR NEGATIVE  BILIRUBINUR NEGATIVE  KETONESUR NEGATIVE  PROTEINUR 100*  UROBILINOGEN 0.2  NITRITE POSITIVE*  LEUKOCYTESUR LARGE*   Micro Results: No results found for this or any previous visit (from the past 240 hour(s)). Studies/Results: Dg Chest 2 View  11/29/2014   CLINICAL DATA:  Shortness of breath.  EXAM: CHEST  2 VIEW  COMPARISON:  11/03/2014 .  FINDINGS: Cardiomegaly with pulmonary vascular prominence and diffuse interstitial prominence with small right pleural effusion. These findings consistent with congestive heart failure .  IMPRESSION: Congestive heart failure with pulmonary interstitial edema and small right pleural effusion. Pulmonary interstitial edema has progressed from prior study of 11/03/2014.   Electronically Signed   By: Maisie Fus  Register   On: 11/29/2014 14:50   Medications: I have reviewed the patient's current medications. Scheduled Meds: . atorvastatin  20 mg Oral QHS  . cefTRIAXone (ROCEPHIN)  IV  1 g Intravenous Q24H  . escitalopram  10 mg Oral Daily  . fenofibrate  160 mg Oral Daily  . furosemide  40 mg Oral BID  . gabapentin  100 mg Oral q morning - 10a   And  . gabapentin  200 mg Oral QHS  . insulin aspart  0-5 Units Subcutaneous QHS  . insulin aspart  0-9 Units Subcutaneous TID WC  . metoprolol tartrate  25  mg Oral BID  . pantoprazole sodium  40 mg Oral Daily  . sodium chloride  3 mL Intravenous Q12H   Continuous Infusions: . sodium chloride 20 mL/hr at 12/01/14 1008   PRN Meds:.albuterol Assessment/Plan: Active Problems:   Anemia   Acute blood loss anemia   Pressure ulcer   Atrial fibrillation  New onset paroxysmal atrial fibrillation: Detected on telemetry. Rate controlled. Cardiology consulted. CHA2DS2-Vasc score of 5 -Increase home metoprolol to 25mg  BID. -Check TSH and Echo -Hold off on anticoagulation in setting of GI bleed.  Symptomatic Anemia: Reports melena. Admission Hgb 5.7 and MCV 87.8. FOBT negative. She was transfused with 2 units pRBC in the ED. Baseline hgb around 10-11. Post transfusion hgb 8.3. Repeat FOBT negative. Hgb 7.5 from 8.3. Iron low. TIBC and Ferritin normal. Vitamin B12 and Folate normal. Reticulocytes elevated. LDH normal. Consistent with acute blood loss and probable underlying anemia of chronic disease. -Transfuse pRBC prn with goal Hgb above 7 -Iron, TIBC, ferritin, Vitamin B12, Folate, Retic, Smear -Protonix 40mg  daily -GI consulted, EGD today  Acute on Chronic Diastolic CHF: Her BNP is increased to 722.9 from previous 466.7 on 11/03/2014. Her weight was 165lb on admission. Her weight was 164lb on 11/22/2014 and 158 on 07/12/2014. Her weight is down to 157lb. -Change Lasix 80mg  IV BID to Lasix 40mg  PO BID -Daily weights and strict I/O's -Unable to continue home ASA 81mg  daily because of her yellow dye allergy and hospital version of ASA 81mg  daily has this ingredient -Continue home Lipitor 20mg  QHH, fenofibrate 160mg  daily  AKI on CKD stage 3: Admission creatinine 2.36. Baseline creatinine around 1.2 to 1.4. Improved to 1.8. -Plan as above -Continue to monitor  UTI: Intermittent confusion prior to admission as well as last night. Denies dysuria, suprapubic pain. Difficulty to assess increased urine frequency as she is on Lasix. UA with many bacteria,  large leukocytes, positive nitrites, numerous WBC. WBC 9.9 but with elevated absolute neutrophils to 8.4. Also temperature of 100.1 last night. In a geriatric patient, this may be considered a fever.  -IV ceftriaxone x 3 days.  COPD: On 2L home O2, albuterol prn. -Continue albuterol neb 2 puff q6hr prn  DM2 with peripheral neuropathy: Last Hgb A1c 8% on 06/09/2013. Hgb A1c 7.7 -Hold home glipizide 10mg  daily -Continue home gabapentin 100mg  qAM and 200mg  qPM.  -SSI -Hgb A1c  HTN: On amlodipine 10mg  daily, hydralazine 25mg  BID, isosorbide mononitrate 30mg  daily, Lopressor 12.5mg  BID. BP 118/45-145/53 on admission. -Restart Lopressor and increase to 25mg  BID.  Anxiety: Continue home Lexapro 10mg  daily  Pressure Ulcer: 4 cm x 3 cm x 0.1 bilateral gluteal folds. WOC consulted. Appreciate recs.  FEN: NPO  VTE ppx: SCDs  Dispo: Disposition is deferred at this time, awaiting improvement of current medical problems.  Anticipated discharge in approximately 1-2 day(s).   The patient does have a current PCP Elizabeth Palau, FNP) and does not need an [facility] hospital follow-up appointment after discharge.  The patient does not have transportation limitations that hinder transportation to clinic appointments.  .Services Needed at time of discharge: Y = Yes, Blank = No PT:   OT:   RN:   Equipment:   Other:     LOS: 2 days   Lora Paula, MD 12/01/2014, 12:47 PM

## 2014-12-01 NOTE — Transfer of Care (Signed)
Immediate Anesthesia Transfer of Care Note  Patient: Alexis [name]  Procedure(s) Performed: Procedure(s): ESOPHAGOGASTRODUODENOSCOPY (EGD) (N/A)  Patient Location: PACU  Anesthesia Type:MAC  Level of Consciousness: awake, alert  and oriented  Airway & Oxygen Therapy: Patient Spontanous Breathing and Patient connected to nasal cannula oxygen  Post-op Assessment: Report given to RN, Post -op Vital signs reviewed and stable and Patient moving all extremities X 4  Post vital signs: Reviewed  Last Vitals:  Filed Vitals:   12/01/14 1310  BP: 120/34  Pulse:   Temp: 37 C  Resp:     Complications: No apparent anesthesia complications

## 2014-12-01 NOTE — Consult Note (Signed)
CARDIOLOGY CONSULT NOTE   Patient ID: Alexis [name] MRN: [id number], DOB/AGE: Oct 12, 1931   Admit date: 11/29/2014 Date of Consult: 12/01/2014   Primary Physician: Elizabeth Palau, FNP Primary Cardiologist: Dr. [name]  Pt. Profile  81 year old Caucasian female with past medical history of HTN, HLD, DM, chronic diastolic HF, and COPD on home 2L O2 present with SOB and weakness and found to have acute on chronic diastolic HF and severe anemia  Problem List  Past Medical History  Diagnosis Date  . Hypertension   . Neuropathy   . Anxiety   . PONV (postoperative nausea and vomiting)   . CHF (congestive heart failure)   . COPD (chronic obstructive pulmonary disease)     on home, nocturnal oxygen.   . Family history of adverse reaction to anesthesia   . Cardiomyopathy   . Diabetes mellitus without complication     TYPE 2  . Macular degeneration, bilateral     Past Surgical History  Procedure Laterality Date  . Hip fracture surgery  2004  . Abdominal hysterectomy    . Vein surgery       Allergies  Allergies  Allergen Reactions  . Other     novocaine broke mouth out  . Codeine Hives  . Penicillins Hives  . Procaine Hives  . Yellow Dyes (Non-Tartrazine) Nausea And Vomiting    "deathly allergic" No other information given to explain reaction.    HPI   The patient is a 81 year old Caucasian female with past medical history of HTN, HLD, DM, chronic diastolic HF, and COPD on home 2L O2. Patient has been followed by Dr. [name]. She has multiple previous admission with acute on chronic diastolic heart failure, there was question about her compliance issue, although patient states she has been compliant with her medication. Her last echocardiogram obtained on 06/10/2013 showed EF 65-70%, no regional wall motion abnormality, PA peak pressure 36 mmHg. She had a negative Myoview on 07/21/2013 which showed mild anterior wall breast attenuation artifact, however no ischemia. The last  time he saw Dr. [name] was in 11/21/2014 at which time her Lasix was increased from 60 mg daily to 40 mg twice a day. According to family member, she was actually on 60 mg a.m. and 40 mg p.m. prior to that visit. I am unable to find any record to backup this dosing by family, and patient cannot confirm this herself due to some degree of dementia. Per patient, she still manages her own medication. Family has noticed a steady decline since coming home from the cardiologist office. She continued to complain of significant weakness. She also endorsed some black tarry stool recently.  Patient presented to [facility] on 11/29/2014 with shortness of breath and weakness. Significant laboratory finding on arrival include BNP of 722, worsening creatinine of 2.4, significant anemia with hemoglobin 6.0, normal MCHC and MCV. Troponin negative. EKG showed baseline artifact, however appears to be sinus. Chest x-ray was consistent with pulmonary edema. Patient was admitted for acute on chronic diastolic heart failure in the setting of severe anemia. She was transfused with 2 units of packed red blood cells. A urinalysis was obtained which was positive for UTI as well. She was placed on 80 mg twice a day of IV Lasix with good urinary output. Her weight has since decreased from 165 pounds on arrival back to her baseline of 157 pounds. And she has been diuresed for > 4L. Patient was noted to have irregular rhythm in the morning of 12/01/2014, cardiology  has been consulted for atrial fibrillation and acute on chronic diastolic heart failure.   Interestingly, on review of telemetry, patient was off telemetry between 10:20 PM and 11:05 PM on the night of 7/6, prior to that she was in sinus rhythm, after that she was in atrial fibrillation. Both the nurse and the patient cannot recall why she was off telemetry during that period.  Inpatient Medications  . atorvastatin  20 mg Oral QHS  . cefTRIAXone (ROCEPHIN)  IV  1 g  Intravenous Q24H  . escitalopram  10 mg Oral Daily  . fenofibrate  160 mg Oral Daily  . furosemide  40 mg Oral BID  . gabapentin  100 mg Oral q morning - 10a   And  . gabapentin  200 mg Oral QHS  . insulin aspart  0-5 Units Subcutaneous QHS  . insulin aspart  0-9 Units Subcutaneous TID WC  . metoprolol tartrate  12.5 mg Oral BID  . pantoprazole sodium  40 mg Oral Daily  . sodium chloride  3 mL Intravenous Q12H    Family History Family History  Problem Relation Age of Onset  . Colon cancer Father 11    advanced when discovered, no surgery or therapy.  this led to his death     Social History History   Social History  . Marital Status: Widowed    Spouse Name: N/A  . Number of Children: N/A  . Years of Education: N/A   Occupational History  . Not on file.   Social History Main Topics  . Smoking status: Never Smoker   . Smokeless tobacco: Never Used  . Alcohol Use: No  . Drug Use: No  . Sexual Activity: Not on file   Other Topics Concern  . Not on file   Social History Narrative     Review of Systems  General:  No chills, fever, night sweats or weight changes.  Cardiovascular:  No chest pain, edema, palpitations, paroxysmal nocturnal dyspnea. +dyspnea on exertion, chronic orthopnea sleep on recliner Dermatological: No rash, lesions/masses Respiratory: No cough, dyspnea Urologic: No hematuria, dysuria Abdominal:   No nausea, vomiting, diarrhea, bright red blood per rectum, melena, or hematemesis Neurologic:  No visual changes, changes in mental status. +wkns All other systems reviewed and are otherwise negative except as noted above.  Physical Exam  Blood pressure 135/48, pulse 94, temperature 99.1 F (37.3 C), temperature source Oral, resp. rate 16, height  (1.575 m), weight 157 lb 6.4 oz (71.396 kg), SpO2 95 %.  General: Pleasant, NAD Psych: Normal affect. Neuro: Alert and oriented X 3. Moves all extremities spontaneously. HEENT: Normal  Neck: Supple  without bruits or JVD. Lungs:  Resp regular and unlabored. Mildly diminished breath sound. Heart: RRR no s3, s4. 1/6 systolic near R upper sternal border. Abdomen: Soft, non-tender, non-distended, BS + x 4.  Extremities: No clubbing, cyanosis or edema. DP/PT/Radials 2+ and equal bilaterally.  Labs   Recent Labs  11/30/14 0028 11/30/14 0631  TROPONINI <0.03 <0.03   Lab Results  Component Value Date   WBC 8.5 12/01/2014   HGB 7.5* 12/01/2014   HCT 23.7* 12/01/2014   MCV 88.1 12/01/2014   PLT 285 12/01/2014    Recent Labs Lab 11/29/14 1558  12/01/14 0400  NA 140  < > 144  K 4.0  < > 3.0*  CL 104  < > 102  CO2 26  < > 30  BUN 77*  < > 61*  CREATININE 2.36*  < >  1.83*  CALCIUM 8.9  < > 8.5*  PROT 5.5*  --   --   BILITOT 0.7  --   --   ALKPHOS 81  --   --   ALT 15  --   --   AST 12*  --   --   GLUCOSE 189*  < > 200*  < > = values in this interval not displayed. Lab Results  Component Value Date   CHOL  04/13/2009    144        ATP III CLASSIFICATION:  <200     mg/dL   Desirable  161-096  mg/dL   Borderline High  >=045    mg/dL   High          HDL 39* 04/13/2009   LDLCALC  04/13/2009    44        Total Cholesterol/HDL:CHD Risk Coronary Heart Disease Risk Table                     Men   Women  1/2 Average Risk   3.4   3.3  Average Risk       5.0   4.4  2 X Average Risk   9.6   7.1  3 X Average Risk  23.4   11.0        Use the calculated Patient Ratio above and the CHD Risk Table to determine the patient's CHD Risk.        ATP III CLASSIFICATION (LDL):  <100     mg/dL   Optimal  409-811  mg/dL   Near or Above                    Optimal  130-159  mg/dL   Borderline  914-782  mg/dL   High  >956     mg/dL   Very High   TRIG 213* 04/13/2009    Radiology/Studies  Dg Chest 2 View  11/29/2014   CLINICAL DATA:  Shortness of breath.  EXAM: CHEST  2 VIEW  COMPARISON:  11/03/2014 .  FINDINGS: Cardiomegaly with pulmonary vascular prominence and diffuse  interstitial prominence with small right pleural effusion. These findings consistent with congestive heart failure .  IMPRESSION: Congestive heart failure with pulmonary interstitial edema and small right pleural effusion. Pulmonary interstitial edema has progressed from prior study of 11/03/2014.   Electronically Signed   By: Maisie Fus  Register   On: 11/29/2014 14:50   Dg Chest Port 1 View  11/03/2014   CLINICAL DATA:  Short of breath. Symptoms for 3 days. Initial encounter. Diabetes.  EXAM: PORTABLE CHEST - 1 VIEW  COMPARISON:  05/12/2014.  FINDINGS: Apical lordotic projection. Cardiomegaly. Pulmonary vascular congestion is present with interstitial pulmonary edema. There is increased opacity over the LEFT lateral chest, which is favored to be due to overlapping soft tissues and breast shadow combined with interstitial edema and atelectasis. Focal airspace disease such as pneumonia could also produce this appearance but is considered less likely. Monitoring leads project over the chest.  IMPRESSION: Combination of findings compatible with mild CHF.   Electronically Signed   By: Andreas Newport M.D.   On: 11/03/2014 19:38    ECG  A-fib without significant ST-T wave   ASSESSMENT AND PLAN  1. Acute on chronic diastolic HF  - well diuresed, weight down from 165 to 157 lbs. -4.2 L I/O  - repeat echo, last echo in 05/2013 showed normal EF  - weight back  to baseline, continue on current dose of lasix  2. Newly diagnosed proxysmal atrial fibrillation  - afib started suprising after her hgb improved. Clearly a-fib on telemetry  - CHA2DS2-Vasc score of 5 (age, female, HTN, DM, CHF)  - This patients CHA2DS2-VASc Score and unadjusted Ischemic Stroke Rate (% per year) is equal to 9.7 % stroke rate/year from a score of 6  - not a candidate for systemic anticoagulation given recurrent fall and severe GIB  - currently rate controlled on low dose BB  3. Acute on chronic renal insufficiency with elevated BUN,  stage IV  - improving with diuresis  4. Severe GI bleed  - pending EGD today   5. UTI  6. HTN 7. HLD 8. DM 9. COPD on home O2    Signed, Azalee Course, [place] 12/01/2014, 11:49 AM As above, patient seen and examined. Briefly she is an 81 year old female with a past medical history of chronic diastolic congestive heart failure, renal insufficiency, hypertension, hyperlipidemia, diabetes mellitus, COPD admitted with a GI bleed who we are asked to evaluate for acute on chronic diastolic congestive heart failure and new onset atrial fibrillation. Patient presented with complaints of progressive weakness, dyspnea on exertion and melanoma. No chest pain, palpitations or syncope. She was found to have a hemoglobin of 5.8. She has improved with transfusion and diuresis. Last evening she was noted to be in atrial fibrillation but is asymptomatic. Cardiology asked to evaluate.  Electrocardiogram shows atrial fibrillation with nonspecific ST changes. 1 new onset atrial fibrillation-patient is asymptomatic. Plan rate control. Increase metoprolol to 25 mg by mouth twice a day and follow heart rate with further adjustments as needed. Check TSH and repeat echocardiogram. Her CHADSvasc is 6. However at present given GI bleed she is not a candidate for anticoagulation. This can be readdressed by Dr. [name] following discharge and improvement in her GI bleed. 2 acute on chronic diastolic congestive heart failure-improved with diuresis. Continue present dose of Lasix and follow renal function. Repeat echocardiogram. 3 chronic stage III renal failure-follow renal function closely with diuresis. 4 upper GI bleed-4 endoscopy today. Management per primary service and gastroenterology. Olga Millers

## 2014-12-01 NOTE — Progress Notes (Signed)
  Echocardiogram 2D Echocardiogram has been performed.  Alexis [name] 12/01/2014, 4:20 PM

## 2014-12-01 NOTE — Anesthesia Preprocedure Evaluation (Addendum)
Anesthesia Evaluation  Patient identified by MRN, date of birth, ID band Patient awake    Reviewed: Allergy & Precautions, NPO status , Patient's Chart, lab work & pertinent test results  History of Anesthesia Complications (+) PONV, Family history of anesthesia reaction and history of anesthetic complications  Airway Mallampati: II  TM Distance: >3 FB Neck ROM: Full    Dental no notable dental hx.    Pulmonary COPD breath sounds clear to auscultation  Pulmonary exam normal       Cardiovascular hypertension, Pt. on medications +CHF Normal cardiovascular examRhythm:Regular Rate:Normal     Neuro/Psych PSYCHIATRIC DISORDERS Anxiety negative neurological ROS     GI/Hepatic negative GI ROS, Neg liver ROS,   Endo/Other  diabetes, Type 2, Oral Hypoglycemic Agents  Renal/GU ARFRenal disease     Musculoskeletal negative musculoskeletal ROS (+)   Abdominal   Peds  Hematology negative hematology ROS (+) anemia ,   Anesthesia Other Findings   Reproductive/Obstetrics negative OB ROS                             Anesthesia Physical Anesthesia Plan  ASA: III  Anesthesia Plan: MAC   Post-op Pain Management:    Induction: Intravenous  Airway Management Planned: Natural Airway  Additional Equipment:   Intra-op Plan:   Post-operative Plan:   Informed Consent: I have reviewed the patients History and Physical, chart, labs and discussed the procedure including the risks, benefits and alternatives for the proposed anesthesia with the patient or authorized representative who has indicated his/her understanding and acceptance.   Dental advisory given  Plan Discussed with: CRNA  Anesthesia Plan Comments:         Anesthesia Quick Evaluation

## 2014-12-01 NOTE — Progress Notes (Signed)
Patient seen and examined. Case d/w residents in detail. I agree with findings and plan as documented in Dr. Joaquin Music note.  Patient is AAO*3 today. Confusion has resolved. She feels better with no new complaints. Cardio f/u appreciated- patient with new onset pAfib which is rate controlled on lopressor. Patient will need a/c given increased CHADS2vasc score but will hold off for now in the setting of GI bleed. Will have patient follow up with Dr. [name] as an outpatient to decide when it would be appropriate to start a/c. Will check TSH and ECHO  Will /u EGD today to look for source of GI bleed and will monitor CBC. Hg mildly decreased today. No further w/u at this time  Will complete 3 day course of rocephin for UTI given AMS in the setting of u/a suggestive of UTI and low grade fevers.

## 2014-12-01 NOTE — Progress Notes (Signed)
PAGED MD ON CALL ABOUT PT 3.0 POTASSIUM AND THAT NO SUPPLEMENT WAS RECEIVED TODAY. ORDERS GIVEN MONITORING WILL CONTINUE.

## 2014-12-01 NOTE — Op Note (Signed)
[facility] [address] [place], [address]   ENDOSCOPY PROCEDURE REPORT  PATIENT: Alexis [name], Alexis [name]  MR#: [id number] BIRTHDATE: June 26, 1931 , 82  yrs. old GENDER: female ENDOSCOPIST: Roxy Cedar, MD REFERRED BY:  [facility] PROCEDURE DATE:  12/01/2014 PROCEDURE:  EGD, diagnostic ASA CLASS:     Class IV INDICATIONS:  anemia.. Patient presented with symptomatic anemia which was normocytic. B12, folate, and ferritin normal. Iron saturation low. Reported 3 days of dark formed stools. Hemoccult-negative. MEDICATIONS: Monitored anesthesia care and Per Anesthesia TOPICAL ANESTHETIC: none DESCRIPTION OF PROCEDURE: After the risks benefits and alternatives of the procedure were thoroughly explained, informed consent was obtained.  The PENTAX GASTOROSCOPE [id number] endoscope was introduced through the mouth and advanced to the second portion of the duodenum , Without limitations.  The instrument was slowly withdrawn as the mucosa was fully examined.  EXAM:The esophagus revealed a small mid esophageal diverticulum. The distal esophagus revealed a benign large caliber rings.  The stomach was normal with benign fundic gland polyps.  The duodenum was normal to and beyond the third portion.  Retroflexed views revealed no abnormalities.     The scope was then withdrawn from the patient and the procedure completed. COMPLICATIONS: There were no immediate complications.  ENDOSCOPIC IMPRESSION: 1. Incidental esophageal diverticulum and esophageal stricture 2. Otherwise unremarkable endoscopy to the duodenal third portion. No blood or bleeding 3. Not clear if her anemia was secondary to GI blood loss, but possible. Unfortunately due to her age and overwhelming comorbidities not a good candidate for more aggressive workup. I do not believe that she will tolerate colonoscopy due to her pulmonary and cardiac disease. Not a candidate for CT evaluation given  renal insufficiency  RECOMMENDATIONS: 1. Recommend once daily oral PPI as she likely has GERD with peptic stricture 2. Recommend oral iron supplementation such as ferrous sulfate 325 mg twice a day. However, when ordered, contraindication came up secondary to a reported history of yellow dye allergy "deathly ill". That needs clarified. If not a serious allergy, but only nausea and vomiting, would prescribe. 3. Outpatient monitoring of her hemoglobin regularly with PCP 4. Transfusion as clinically indicated to ameliorate symptoms related to anemia. DISCUSSED WITH PATIENT, her daughter Arline Asp, and her granddaughter Neysa Bonito ([place]). Who all understand and agree. Will sign off. thank you  REPEAT EXAM:  eSigned:  Roxy Cedar, MD 12/01/2014 3:02 PM CC:The Patient and Elizabeth Palau, F.N.P.-B.C.

## 2014-12-01 NOTE — Progress Notes (Signed)
Physical Therapy Treatment Patient Details Name: Alexis [name] MRN: [id number] DOB: August 10, 1931 Today's Date: 12/01/2014    History of Present Illness Pt adm with anemia and acute on chronic heart failure. PMH - COPD, DM, HTN    PT Comments    Pt admitted with above diagnosis and presents to PT with functional limitations due to deficits listed below (See PT problem list). Pt needs skilled PT to maximize independence and safety to allow discharge to home if pt progresses as expected. If progress slower than expected may need ST-SNF.   Follow Up Recommendations  Home health PT;Supervision for mobility/OOB (initially)     Equipment Recommendations  Rolling walker with 5" wheels    Recommendations for Other Services       Precautions / Restrictions Precautions Precautions: Fall    Mobility  Bed Mobility Overal bed mobility: Needs Assistance Bed Mobility: Supine to Sit     Supine to sit: Supervision;HOB elevated        Transfers Overall transfer level: Needs assistance Equipment used: Rolling walker (2 wheeled) Transfers: Sit to/from Stand Sit to Stand: Min assist         General transfer comment: Assist for balance  Ambulation/Gait Ambulation/Gait assistance: Min assist Ambulation Distance (Feet): 100 Feet Assistive device: Rolling walker (2 wheeled) Gait Pattern/deviations: Step-to pattern;Decreased stride length;Staggering left;Staggering right   Gait velocity interpretation: Below normal speed for age/gender General Gait Details: Unsteady gait with assist for balance   Stairs            Wheelchair Mobility    Modified Rankin (Stroke Patients Only)       Balance Overall balance assessment: Needs assistance Sitting-balance support: No upper extremity supported;Feet supported Sitting balance-Leahy Scale: Good     Standing balance support: Bilateral upper extremity supported Standing balance-Leahy Scale: Poor Standing balance comment:  support of walker                    Cognition Arousal/Alertness: Awake/alert Behavior During Therapy: WFL for tasks assessed/performed Overall Cognitive Status: Within Functional Limits for tasks assessed                      Exercises      General Comments        Pertinent Vitals/Pain Pain Assessment: No/denies pain    Home Living Family/patient expects to be discharged to:: Private residence Living Arrangements: Children Available Help at Discharge: Family;Available PRN/intermittently Type of Home: House Home Access: Stairs to enter Entrance Stairs-Rails: None   Home Equipment: Environmental consultant - 2 wheels;Cane - single point;Shower seat Additional Comments: Pt sleeps in recliner    Prior Function Level of Independence: Independent with assistive device(s)      Comments: Uses walker at home. History of falls.   PT Goals (current goals can now be found in the care plan section) Acute Rehab PT Goals Patient Stated Goal: Return home PT Goal Formulation: With patient Time For Goal Achievement: 12/08/14 Potential to Achieve Goals: Good    Frequency  Min 3X/week    PT Plan      Co-evaluation             End of Session Equipment Utilized During Treatment: Oxygen Activity Tolerance: Patient limited by fatigue Patient left: in chair;with family/visitor present (Nurse tech assisting with bathing)     Time: 5638-9373 PT Time Calculation (min) (ACUTE ONLY): 14 min  Charges:  G Codes:      Rosemary Mossbarger 12/01/2014, 1:33 PM  [facility] PT [phone]

## 2014-12-01 NOTE — Anesthesia Postprocedure Evaluation (Signed)
  Anesthesia Post-op Note  Patient: Alexis [name]  Procedure(s) Performed: Procedure(s): ESOPHAGOGASTRODUODENOSCOPY (EGD) (N/A)  Patient Location: PACU  Anesthesia Type:MAC  Level of Consciousness: awake  Airway and Oxygen Therapy: Patient Spontanous Breathing  Post-op Pain: mild  Post-op Assessment: Post-op Vital signs reviewed              Post-op Vital Signs: Reviewed  Last Vitals:  Filed Vitals:   12/01/14 1510  BP: 124/42  Pulse: 78  Temp:   Resp: 22    Complications: No apparent anesthesia complications

## 2014-12-01 NOTE — Interval H&P Note (Signed)
History and Physical Interval Note:  Patient breathing much better than yesterday. Stable for EGD  12/01/2014 1:56 PM  Alexis [name]  has presented today for surgery, with the diagnosis of anemia.  hx black stools one week ago.  The various methods of treatment have been discussed with the patient and family. After consideration of risks, benefits and other options for treatment, the patient has consented to  Procedure(s): ESOPHAGOGASTRODUODENOSCOPY (EGD) (N/A) as a surgical intervention .  The patient's history has been reviewed, patient examined, no change in status, stable for surgery.  I have reviewed the patient's chart and labs.  Questions were answered to the patient's satisfaction.     [name]

## 2014-12-02 ENCOUNTER — Encounter (HOSPITAL_COMMUNITY): Payer: Self-pay | Admitting: Internal Medicine

## 2014-12-02 DIAGNOSIS — D649 Anemia, unspecified: Secondary | ICD-10-CM

## 2014-12-02 DIAGNOSIS — N289 Disorder of kidney and ureter, unspecified: Secondary | ICD-10-CM

## 2014-12-02 DIAGNOSIS — D62 Acute posthemorrhagic anemia: Secondary | ICD-10-CM

## 2014-12-02 DIAGNOSIS — K921 Melena: Secondary | ICD-10-CM

## 2014-12-02 DIAGNOSIS — I509 Heart failure, unspecified: Secondary | ICD-10-CM

## 2014-12-02 DIAGNOSIS — I4891 Unspecified atrial fibrillation: Secondary | ICD-10-CM

## 2014-12-02 LAB — CBC
HCT: 24.4 % — ABNORMAL LOW (ref 36.0–46.0)
HEMOGLOBIN: 7.6 g/dL — AB (ref 12.0–15.0)
MCH: 28 pg (ref 26.0–34.0)
MCHC: 31.1 g/dL (ref 30.0–36.0)
MCV: 90 fL (ref 78.0–100.0)
Platelets: 295 10*3/uL (ref 150–400)
RBC: 2.71 MIL/uL — AB (ref 3.87–5.11)
RDW: 17.1 % — ABNORMAL HIGH (ref 11.5–15.5)
WBC: 6.3 10*3/uL (ref 4.0–10.5)

## 2014-12-02 LAB — BASIC METABOLIC PANEL
Anion gap: 10 (ref 5–15)
BUN: 44 mg/dL — ABNORMAL HIGH (ref 6–20)
CALCIUM: 8.4 mg/dL — AB (ref 8.9–10.3)
CO2: 33 mmol/L — ABNORMAL HIGH (ref 22–32)
Chloride: 102 mmol/L (ref 101–111)
Creatinine, Ser: 1.63 mg/dL — ABNORMAL HIGH (ref 0.44–1.00)
GFR calc Af Amer: 33 mL/min — ABNORMAL LOW (ref >60)
GFR calc non Af Amer: 29 mL/min — ABNORMAL LOW (ref >60)
Glucose, Bld: 164 mg/dL — ABNORMAL HIGH (ref 65–99)
Potassium: 3.2 mmol/L — ABNORMAL LOW (ref 3.5–5.1)
Sodium: 145 mmol/L (ref 135–145)

## 2014-12-02 LAB — GLUCOSE, CAPILLARY
GLUCOSE-CAPILLARY: 149 mg/dL — AB (ref 65–99)
GLUCOSE-CAPILLARY: 311 mg/dL — AB (ref 65–99)

## 2014-12-02 LAB — TSH: TSH: 0.337 u[IU]/mL — ABNORMAL LOW (ref 0.350–4.500)

## 2014-12-02 LAB — T4, FREE: Free T4: 1.44 ng/dL — ABNORMAL HIGH (ref 0.61–1.12)

## 2014-12-02 MED ORDER — FUROSEMIDE 40 MG PO TABS: 40.0000 mg | ORAL_TABLET | Freq: Two times a day (BID) | ORAL | Status: DC

## 2014-12-02 MED ORDER — POLYSACCHARIDE IRON COMPLEX 150 MG PO CAPS: 150.0000 mg | ORAL_CAPSULE | Freq: Two times a day (BID) | ORAL | Status: DC

## 2014-12-02 MED ORDER — POLYSACCHARIDE IRON COMPLEX 150 MG PO CAPS
150.0000 mg | ORAL_CAPSULE | Freq: Two times a day (BID) | ORAL | Status: DC
  Administered 2014-12-02: 150 mg via ORAL
  Filled 2014-12-02 (×2): qty 1

## 2014-12-02 MED ORDER — PANTOPRAZOLE SODIUM 40 MG PO TBEC: 40.0000 mg | DELAYED_RELEASE_TABLET | Freq: Every day | ORAL | Status: DC

## 2014-12-02 MED ORDER — POTASSIUM CHLORIDE CRYS ER 20 MEQ PO TBCR
40.0000 meq | EXTENDED_RELEASE_TABLET | Freq: Once | ORAL | Status: AC
  Administered 2014-12-02: 40 meq via ORAL
  Filled 2014-12-02: qty 2

## 2014-12-02 MED ORDER — METOPROLOL TARTRATE 25 MG PO TABS: 25.0000 mg | ORAL_TABLET | Freq: Two times a day (BID) | ORAL | Status: DC

## 2014-12-02 MED ORDER — SENNA 8.6 MG PO TABS: 1.0000 | ORAL_TABLET | Freq: Every day | ORAL | Status: DC | PRN

## 2014-12-02 NOTE — Progress Notes (Signed)
Physical Therapy Treatment Patient Details Name: Alexis [name] MRN: [id number] DOB: August 21, 1931 Today's Date: 2014-12-06    History of Present Illness Pt adm with anemia and acute on chronic heart failure. PMH - COPD, DM, HTN    PT Comments    Pt is having difficulty with fatigue after being up to BR and then had lunch.  She is too tired to walk, but is hoping to go home.  Plan is to see after today for ongoing therapy at home.  Will continue to work with her as able on going for gait and transfers to restore independent function.   Follow Up Recommendations  Home health PT;Supervision for mobility/OOB     Equipment Recommendations  Rolling walker with 5" wheels    Recommendations for Other Services       Precautions / Restrictions Precautions Precautions: Fall Restrictions Weight Bearing Restrictions: No    Mobility  Bed Mobility Overal bed mobility: Needs Assistance             General bed mobility comments: Scooting up in bed with mod assist and use of trendelenburg feature  Transfers                 General transfer comment: declined  Ambulation/Gait             General Gait Details: declined   Stairs            Wheelchair Mobility    Modified Rankin (Stroke Patients Only)       Balance                                    Cognition Arousal/Alertness: Awake/alert Behavior During Therapy: WFL for tasks assessed/performed Overall Cognitive Status: Within Functional Limits for tasks assessed                      Exercises General Exercises - Lower Extremity Ankle Circles/Pumps: AROM;Both;5 reps Quad Sets: PROM;Both;10 reps Gluteal Sets: AROM;Both;5 reps Heel Slides: AROM;Both;20 reps Hip ABduction/ADduction: AROM;Both;20 reps Hip Flexion/Marching: AROM;Both;5 reps    General Comments General comments (skin integrity, edema, etc.): Pt was up to walk to BR and declines to do any therapy, but did  agree to work on ther ex.      Pertinent Vitals/Pain Pain Assessment: No/denies pain    Home Living                      Prior Function            PT Goals (current goals can now be found in the care plan section) Acute Rehab PT Goals Patient Stated Goal: Return home Progress towards PT goals: Progressing toward goals    Frequency  Min 3X/week    PT Plan Current plan remains appropriate    Co-evaluation             End of Session Equipment Utilized During Treatment: Oxygen Activity Tolerance: Patient limited by fatigue Patient left: in bed;with call bell/phone within reach;with bed alarm set     Time: 2248-2500 PT Time Calculation (min) (ACUTE ONLY): 23 min  Charges:  $Therapeutic Exercise: 8-22 mins $Therapeutic Activity: 8-22 mins                    G Codes:      Ivar Drape December 06, 2014, 1:44 PM   Samul Dada, PT MS  Acute Rehab Dept. Number: [facility] [id number] and [facility] [phone]

## 2014-12-02 NOTE — Care Management Note (Signed)
Case Management Note  Patient Details  Name: Alexis [name] MRN: [id number] Date of Birth: Sep 27, 1931  Subjective/Objective:      NCM spoke with patient and daughter she has a rolling walker and a rollator at home.  They chose [facility] for HHPT, referral made to [facility], Tiffany notified.  Soc will begin 24-48 hrs post dc.  Patient only lives ten minutes away and she states she will be fine w/out her oyxgen because she is not on it all the time.                Action/Plan:   Expected Discharge Date:                  Expected Discharge Plan:  Home w Home Health Services  In-House Referral:     Discharge planning Services  CM Consult  Post Acute Care Choice:    Choice offered to:  Patient, Adult Children  DME Arranged:    DME Agency:     HH Arranged:  PT HH Agency:  [facility]  Status of Service:  Completed, signed off  Medicare Important Message Given:  Yes-second notification given Date Medicare IM Given:    Medicare IM give by:    Date Additional Medicare IM Given:    Additional Medicare Important Message give by:     If discussed at Long Length of Stay Meetings, dates discussed:    Additional Comments:  Leone Haven, RN 12/02/2014, 3:08 PM

## 2014-12-02 NOTE — Discharge Summary (Signed)
Name: Alexis [name] MRN: [id number] DOB: 1932-03-30 81 y.o. PCP: Elizabeth Palau, FNP  Date of Admission: 11/29/2014  2:02 PM Date of Discharge: 12/02/2014 Attending Physician: Earl Lagos, MD  Discharge Diagnosis: Principal Problem:   Acute blood loss anemia Active Problems:   Pressure ulcer   Atrial fibrillation   Congestive heart disease   Renal insufficiency  Discharge Medications:   Medication List    STOP taking these medications        amLODipine 10 MG tablet  Commonly known as:  NORVASC     hydrALAZINE 25 MG tablet  Commonly known as:  APRESOLINE     isosorbide mononitrate 30 MG 24 hr tablet  Commonly known as:  IMDUR     predniSONE 20 MG tablet  Commonly known as:  DELTASONE      TAKE these medications        acetaminophen 325 MG tablet  Commonly known as:  TYLENOL  Take 650 mg by mouth every 6 (six) hours as needed for mild pain.     albuterol 108 (90 BASE) MCG/ACT inhaler  Commonly known as:  PROVENTIL HFA;VENTOLIN HFA  Inhale 2 puffs into the lungs every 6 (six) hours as needed for wheezing or shortness of breath.     aspirin EC 81 MG tablet  Take 81 mg by mouth at bedtime.     cholecalciferol 1000 UNITS tablet  Commonly known as:  VITAMIN D  Take 1,000 Units by mouth at bedtime.     escitalopram 10 MG tablet  Commonly known as:  LEXAPRO  Take 10 mg by mouth daily.     fenofibrate 160 MG tablet  Take 160 mg by mouth daily.     furosemide 40 MG tablet  Commonly known as:  LASIX  Take 1 tablet (40 mg total) by mouth 2 (two) times daily.     gabapentin 100 MG capsule  Commonly known as:  NEURONTIN  Take 100-200 mg by mouth 2 (two) times daily. Take 1 capsule (100 mg) morning, take 2 capsules (200 mg) at bedtime     glipiZIDE 10 MG 24 hr tablet  Commonly known as:  GLUCOTROL XL  Take 10 mg by mouth daily.     GOLD BOND MAXIMUM RELIEF 1-1 % Crea  Generic drug:  Pramoxine-Menthol  Apply 1 application topically 2 (two) times  daily as needed (for would care).     iron polysaccharides 150 MG capsule  Commonly known as:  NIFEREX  Take 1 capsule (150 mg total) by mouth 2 (two) times daily.     LIPITOR 20 MG tablet  Generic drug:  atorvastatin  Take 20 mg by mouth at bedtime.     metoprolol tartrate 25 MG tablet  Commonly known as:  LOPRESSOR  Take 1 tablet (25 mg total) by mouth 2 (two) times daily.     multivitamin with minerals Tabs tablet  Take 1 tablet by mouth daily.     OXYGEN  Inhale into the lungs.     pantoprazole 40 MG tablet  Commonly known as:  PROTONIX  Take 1 tablet (40 mg total) by mouth daily.        Disposition and follow-up:   Ms.Alexis [name] was discharged from [facility] in Stable condition.  At the hospital follow up visit please address:  1.  Symptomatic Acute Blood Loss Anemia: EGD 12/01/2014 found Incidental esophageal diverticulum and esophageal stricture but otherwise unremarkable. Not a good candidate for more aggressive workup. She  will need outpatient monitoring of hemoglobin regularly. She was discharged with Protonix 40mg  daily and Nu-Iron 150mg  BID and senna for possible constipation.  Acute on Chronic Diastolic CHF:  Her weight went down to 155lb with diuresis and she was restarted on her home regimen of Lasix 40mg  PO BID.  New onset paroxysmal atrial fibrillation: CHA2DS2-Vasc score of 6. Anticoagulation was held off in setting of GI bleed. She will follow up with her cardiologist, Dr. [name].   2.  Labs / imaging needed at time of follow-up: CBC, BMP  3.  Pending labs/ test needing follow-up: None  Follow-up Appointments:     Follow-up Information    Follow up with Elizabeth Palau, FNP. Go on 12/08/2014.   Specialty:  Nurse Practitioner   Why:  1:30PM for hospital follow up   Contact information:   [address] [place] [address] [phone]       Follow up with Chrystie Nose, MD. Go on 12/07/2014.    Specialty:  Cardiology   Why:  4:15PM for cardiology follow up   Contact information:   [address] [place] [address] [phone]       Discharge Instructions: Discharge Instructions    Call MD for:  difficulty breathing, headache or visual disturbances    Complete by:  As directed      Call MD for:  persistant dizziness or light-headedness    Complete by:  As directed      Call MD for:  persistant nausea and vomiting    Complete by:  As directed      Call MD for:  severe uncontrolled pain    Complete by:  As directed      Call MD for:  temperature >100.4    Complete by:  As directed      Diet - low sodium heart healthy    Complete by:  As directed      Increase activity slowly    Complete by:  As directed            Consultations: Cardiology  Procedures Performed:  Dg Chest 2 View  11/29/2014   CLINICAL DATA:  Shortness of breath.  EXAM: CHEST  2 VIEW  COMPARISON:  11/03/2014 .  FINDINGS: Cardiomegaly with pulmonary vascular prominence and diffuse interstitial prominence with small right pleural effusion. These findings consistent with congestive heart failure .  IMPRESSION: Congestive heart failure with pulmonary interstitial edema and small right pleural effusion. Pulmonary interstitial edema has progressed from prior study of 11/03/2014.   Electronically Signed   By: Maisie Fus  Register   On: 11/29/2014 14:50   Dg Chest Port 1 View  11/03/2014   CLINICAL DATA:  Short of breath. Symptoms for 3 days. Initial encounter. Diabetes.  EXAM: PORTABLE CHEST - 1 VIEW  COMPARISON:  05/12/2014.  FINDINGS: Apical lordotic projection. Cardiomegaly. Pulmonary vascular congestion is present with interstitial pulmonary edema. There is increased opacity over the LEFT lateral chest, which is favored to be due to overlapping soft tissues and breast shadow combined with interstitial edema and atelectasis. Focal airspace disease such as pneumonia could also produce this appearance  but is considered less likely. Monitoring leads project over the chest.  IMPRESSION: Combination of findings compatible with mild CHF.   Electronically Signed   By: Andreas Newport M.D.   On: 11/03/2014 19:38    2D Echo:  Study Conclusions  - Left ventricle: The cavity size was normal. Systolic function was normal. The estimated  ejection fraction was in the range of 55% to 60%. Wall motion was normal; there were no regional wall motion abnormalities. - Aortic valve: Valve area (VTI): 1.78 cm^2. Valve area (Vmax): 1.82 cm^2. Valve area (Vmean): 1.64 cm^2. - Mitral valve: There was mild regurgitation. Valve area by continuity equation (using LVOT flow): 1.79 cm^2. - Left atrium: The atrium was mildly dilated.  Transthoracic echocardiography. M-mode, complete 2D, spectral Doppler, and color Doppler. Birthdate: Patient birthdate: 1932/01/22. Age: Patient is 81 yr old. Sex: Gender: female. BMI: 30 kg/m^2. Blood pressure:   124/42 Patient status: Inpatient. Study date: Study date: 12/01/2014. Study time: 03:46 PM. Location: Echo laboratory.   Admission HPI: [name] is an 81 year old woman with history of diastolic CHF, COPD on O2 at night and as needed, HTN, DM2 presenting with dyspnea. It has been worsening of the past week. Worsened by exertion. Some at rest. Unclear if she has orthopnea as she has slept in a recliner since she had hip surgery years ago. Denies PND. Her LE edema is unchanged from baseline. She has been wearing her oxygen more continuously for the last 4 days. She reports she is taking Lasix 20mg  BID.  She reports generalized weakness. She has been falling more often on her buttocks - denies head trauma or loss of consciousness.   She notes some light epistaxis intermittently. She has melena that started Tuesday or Wednesday. Denies hematochezia, hematuria. She had a colonoscopy about 10 years ago which she reports had no abnormal  findings. Denies NSAID use or blood thinners. No history of GI ulcers or GERD.  She denies fevers, chills, vision changes, change in her chronic intermittent dry cough, chest pain, palpitations, nausea, vomiting, abdominal pain, diarrhea, dysuria.  Recently seen by cardiology 11/22/2014 and she appeared to need additional diuresis. Her Lasix was increased to 40mg  BID.   Hospital Course by problem list:   1. Symptomatic Acute Blood Loss Anemia: Reports melena. Likely component of her dyspnea and generalized weakness is acute on chronic anemia. Admission Hgb 5.7 and MCV 87.8. FOBT negative. She was transfused with 2 units pRBC in the ED. Baseline hgb around 10-11. She was admitted for further workup. She was seen by GI in consultation. Post transfusion hgb 8.3. Repeat FOBT negative. Iron low. TIBC and Ferritin normal. Vitamin B12 and Folate normal. Reticulocytes elevated. LDH normal. Consistent with acute blood loss and probable underlying anemia of chronic disease. EGD 12/01/2014 found Incidental esophageal diverticulum and esophageal stricture but otherwise unremarkable. Not a good candidate for more aggressive workup. She will need outpatient monitoring of hemoglobin regularly. She was discharged with Protonix 40mg  daily and Nu-Iron 150mg  BID and senna for possible constipation. At discharge, she had no further episodes of melena.  2. Acute on Chronic Diastolic CHF:  Likely component of her dyspnea is acute on chronic diastolic CHF. Her BNP is increased to 722.9 from previous 466.7 on 11/03/2014. CXR with pulmonary interstitial edema and small right pleural effusion. Troponin POC in ED 0.01. Was supposed to be on Lasix 40mg  BID at home but she reports taking Lasix 20mg  BID. Question of noncompliance in the past. She received IV Lasix 40mg  x1 in the ED. She had of urine output. Her weight was 165lb on admission. Her weight was 164lb on 11/22/2014 and 158 on 07/12/2014. An additional Lasix 40mg  IV given.  She was continued on  IV Lasix 80mg  BID. She was continued on home Lipitor 20mg  QHS, fenofibrate 160mg  daily. Unable to continue home ASA  81mg  daily because of her yellow dye allergy and hospital version of ASA 81mg  daily has this ingredient. Troponins were negative x 3 and repeat EKG unremarkable. Her weight went down to 155lb with diuresis and she was restarted on her home regimen of Lasix 40mg  PO BID. At discharge she reported improvement of her symptoms.  3. New onset paroxysmal atrial fibrillation: CHA2DS2-Vasc score of 6. Echo 12/01/14 showed LV EF of 55-60%. TSH low 0.337. Free T4 elevated at 1.44. She will need this further evaluated as an outpatient. Her home metoprolol was increased to 25mg  BID. Anticoagulation was held off in setting of GI bleed. She will follow up with her cardiologist, Dr. [name]. At discharge, her heart rate was controlled.   4. AKI on CKD stage 3: Admission creatinine 2.36. Baseline creatinine around 1.2 to 1.4. Improved to 1.6. With treatment of acute on chronic diastolic CHF. Likely was cardiorenal in etiology.  5. Stage II Pressure Ulcer: She has moisture associated skin damage. Area is 4 cm x 3 cm x 0.1 bilateral gluteal folds. She was seen in consultation by WOC. Recommended cleanse buttocks daily with soap and water and PRN incontinence.Apply barrier cream daily and PRN incontinence.   Discharge Vitals:   BP 121/47 mmHg  Pulse 73  Temp(Src) 98.8 F (37.1 C) (Oral)  Resp 14  Ht 5\' 2"  (1.575 m)  Wt 155 lb 4.8 oz (70.444 kg)  BMI 28.40 kg/m2  SpO2 96%  Discharge Labs:  Results for orders placed or performed during the hospital encounter of 11/29/14 (from the past 24 hour(s))  Culture, Urine     Status: None (Preliminary result)   Collection Time: 12/01/14  3:30 PM  Result Value Ref Range   Specimen Description URINE, RANDOM    Special Requests NONE    Culture NO GROWTH < 24 HOURS    Report Status PENDING   Glucose, capillary     Status: Abnormal    Collection Time: 12/01/14  4:37 PM  Result Value Ref Range   Glucose-Capillary 125 (H) 65 - 99 mg/dL  Glucose, capillary     Status: Abnormal   Collection Time: 12/01/14  9:13 PM  Result Value Ref Range   Glucose-Capillary 275 (H) 65 - 99 mg/dL  TSH     Status: Abnormal   Collection Time: 12/02/14  5:08 AM  Result Value Ref Range   TSH 0.337 (L) 0.350 - 4.500 uIU/mL  Basic metabolic panel     Status: Abnormal   Collection Time: 12/02/14  5:08 AM  Result Value Ref Range   Sodium 145 135 - 145 mmol/L   Potassium 3.2 (L) 3.5 - 5.1 mmol/L   Chloride 102 101 - 111 mmol/L   CO2 33 (H) 22 - 32 mmol/L   Glucose, Bld 164 (H) 65 - 99 mg/dL   BUN 44 (H) 6 - 20 mg/dL   Creatinine, Ser 1.61 (H) 0.44 - 1.00 mg/dL   Calcium 8.4 (L) 8.9 - 10.3 mg/dL   GFR calc non Af Amer 29 (L) >60 mL/min   GFR calc Af Amer 33 (L) >60 mL/min   Anion gap 10 5 - 15  CBC     Status: Abnormal   Collection Time: 12/02/14  5:08 AM  Result Value Ref Range   WBC 6.3 4.0 - 10.5 K/uL   RBC 2.71 (L) 3.87 - 5.11 MIL/uL   Hemoglobin 7.6 (L) 12.0 - 15.0 g/dL   HCT 09.6 (L) 04.5 - 40.9 %   MCV 90.0 78.0 -  100.0 fL   MCH 28.0 26.0 - 34.0 pg   MCHC 31.1 30.0 - 36.0 g/dL   RDW 16.1 (H) 09.6 - 04.5 %   Platelets 295 150 - 400 K/uL  Glucose, capillary     Status: Abnormal   Collection Time: 12/02/14  8:05 AM  Result Value Ref Range   Glucose-Capillary 149 (H) 65 - 99 mg/dL  Glucose, capillary     Status: Abnormal   Collection Time: 12/02/14 12:34 PM  Result Value Ref Range   Glucose-Capillary 311 (H) 65 - 99 mg/dL    Signed: Lora Paula, MD 12/02/2014, 2:34 PM    Services Ordered on Discharge: None Equipment Ordered on Discharge: None

## 2014-12-02 NOTE — Progress Notes (Signed)
Patient Name: Alexis [name] Date of Encounter: 12/02/2014  Active Problems:   Anemia   Acute blood loss anemia   Pressure ulcer   Atrial fibrillation   Melena   SUBJECTIVE  Feels ok. Denies chest pain, SOB or palpitation.   CURRENT MEDS . atorvastatin  20 mg Oral QHS  . cefTRIAXone (ROCEPHIN)  IV  1 g Intravenous Q24H  . escitalopram  10 mg Oral Daily  . fenofibrate  160 mg Oral Daily  . furosemide  40 mg Oral BID  . gabapentin  100 mg Oral q morning - 10a   And  . gabapentin  200 mg Oral QHS  . insulin aspart  0-5 Units Subcutaneous QHS  . insulin aspart  0-9 Units Subcutaneous TID WC  . metoprolol tartrate  25 mg Oral BID  . pantoprazole sodium  40 mg Oral Daily  . sodium chloride  3 mL Intravenous Q12H    OBJECTIVE  Filed Vitals:   12/01/14 1638 12/01/14 2118 12/01/14 2125 12/02/14 0618  BP: 126/44 140/42  124/45  Pulse: 51 67 71 79  Temp: 98.5 F (36.9 C) 99.4 F (37.4 C)  98.5 F (36.9 C)  TempSrc: Oral Oral  Oral  Resp: 20 16  15   Height:      Weight:    155 lb 4.8 oz (70.444 kg)  SpO2: 96% 95%  95%    Intake/Output Summary (Last 24 hours) at 12/02/14 0937 Last data filed at 12/02/14 0909  Gross per 24 hour  Intake    340 ml  Output    950 ml  Net   -610 ml   Filed Weights   11/30/14 0508 12/01/14 0536 12/02/14 0618  Weight: 163 lb 9.6 oz (74.208 kg) 157 lb 6.4 oz (71.396 kg) 155 lb 4.8 oz (70.444 kg)    PHYSICAL EXAM  General: Pleasant, NAD. Neuro: Alert and oriented X 3. Moves all extremities spontaneously. Psych: Normal affect. HEENT:  Normal  Neck: Supple without bruits or JVD. Lungs:  Resp regular and unlabored, CTA. Heart: irregular  no s3, s4. systolic murmur.  Abdomen: Soft, non-tender, non-distended, BS + x 4.  Extremities: No clubbing, cyanosis or edema. DP/PT/Radials 2+ and equal bilaterally.  Accessory Clinical Findings  CBC  Recent Labs  11/29/14 1420  12/01/14 0400 12/02/14 0508  WBC 9.9  < > 8.5 6.3    NEUTROABS 8.4*  --   --   --   HGB 6.0*  < > 7.5* 7.6*  HCT 19.2*  < > 23.7* 24.4*  MCV 87.3  < > 88.1 90.0  PLT 310  < > 285 295  < > = values in this interval not displayed. Basic Metabolic Panel  Recent Labs  11/30/14 1512 12/01/14 0400 12/02/14 0508  NA 141 144 145  K 3.5 3.0* 3.2*  CL 102 102 102  CO2 28 30 33*  GLUCOSE 256* 200* 164*  BUN 68* 61* 44*  CREATININE 1.95* 1.83* 1.63*  CALCIUM 8.8* 8.5* 8.4*  MG 2.3  --   --   PHOS 4.0  --   --    Liver Function Tests  Recent Labs  11/29/14 1558  AST 12*  ALT 15  ALKPHOS 81  BILITOT 0.7  PROT 5.5*  ALBUMIN 2.7*   Cardiac Enzymes  Recent Labs  11/30/14 0028 11/30/14 0631  TROPONINI <0.03 <0.03   Hemoglobin A1C  Recent Labs  11/30/14 1610  HGBA1C 7.7*   Thyroid Function Tests  Recent Labs  12/02/14  0508  TSH 0.337*    TELE  Afib at rate of 60-90. Occasional PVCs.   Radiology/Studies  Dg Chest 2 View  11/29/2014   CLINICAL DATA:  Shortness of breath.  EXAM: CHEST  2 VIEW  COMPARISON:  11/03/2014 .  FINDINGS: Cardiomegaly with pulmonary vascular prominence and diffuse interstitial prominence with small right pleural effusion. These findings consistent with congestive heart failure .  IMPRESSION: Congestive heart failure with pulmonary interstitial edema and small right pleural effusion. Pulmonary interstitial edema has progressed from prior study of 11/03/2014.   Electronically Signed   By: Maisie Fus  Register   On: 11/29/2014 14:50   Dg Chest Port 1 View  11/03/2014   CLINICAL DATA:  Short of breath. Symptoms for 3 days. Initial encounter. Diabetes.  EXAM: PORTABLE CHEST - 1 VIEW  COMPARISON:  05/12/2014.  FINDINGS: Apical lordotic projection. Cardiomegaly. Pulmonary vascular congestion is present with interstitial pulmonary edema. There is increased opacity over the LEFT lateral chest, which is favored to be due to overlapping soft tissues and breast shadow combined with interstitial edema and  atelectasis. Focal airspace disease such as pneumonia could also produce this appearance but is considered less likely. Monitoring leads project over the chest.  IMPRESSION: Combination of findings compatible with mild CHF.   Electronically Signed   By: Andreas Newport M.D.   On: 11/03/2014 19:38   Echo 12/01/14 LV EF: 55% -  60%  ------------------------------------------------------------------- Indications:   Dyspnea 786.09.  ------------------------------------------------------------------- History:  PMH:  Atrial fibrillation. Congestive heart failure. Chronic obstructive pulmonary disease. Risk factors: Hypertension. Diabetes mellitus.  ------------------------------------------------------------------- Study Conclusions  - Left ventricle: The cavity size was normal. Systolic function was normal. The estimated ejection fraction was in the range of 55% to 60%. Wall motion was normal; there were no regional wall motion abnormalities. - Aortic valve: Valve area (VTI): 1.78 cm^2. Valve area (Vmax): 1.82 cm^2. Valve area (Vmean): 1.64 cm^2. - Mitral valve: There was mild regurgitation. Valve area by continuity equation (using LVOT flow): 1.79 cm^2. - Left atrium: The atrium was mildly dilated.   ENDOSCOPIC IMPRESSION: 12/01/14 1. Incidental esophageal diverticulum and esophageal stricture 2. Otherwise unremarkable endoscopy to the duodenal third portion. No blood or bleeding 3. Not clear if her anemia was secondary to GI blood loss, but possible. Unfortunately due to her age and overwhelming comorbidities not a good candidate for more aggressive workup. I do not believe that she will tolerate colonoscopy due to her pulmonary and cardiac disease. Not a candidate for CT evaluation given renal insufficiency RECOMMENDATIONS: 1. Recommend once daily oral PPI as she likely has GERD with peptic stricture 2. Recommend oral iron supplementation such as ferrous sulfate 325 mg  twice a day. However, when ordered, contraindication came up secondary to a reported history of yellow dye allergy "deathly ill". That needs clarified. If not a serious allergy, but only nausea and vomiting, would prescribe. 3. Outpatient monitoring of her hemoglobin regularly with PCP 4. Transfusion as clinically indicated to ameliorate symptoms related to anemia. DISCUSSED WITH PATIENT, her daughter Arline Asp, and her granddaughter Neysa Bonito ([place]). Who all understand and agree.   1. Acute on chronic diastolic HF - Net I/O negative 4.9 L and 10lb weight loss since admission. well diuresed. Continue current regimen, Lasix 40mg  BID (this is her home dose). Weight back to baseline.  -  last echo in 05/2013 showed normal EF. Echo 12/01/14 showed LV EF of 55-60%, mild mitral regur, mild left atrium dilation, Arotic Valve area (VTI): 1.78 cm^2. Valve  area (Vmax): 1.82 cm^2. Valve area (Vmean): 1.64 cm^2.  2. Newly diagnosed proxysmal atrial fibrillation - CHA2DS2-Vasc score of 6 (age, female, HTN, DM, CHF). However not a candidate for for anticoagulation given present GI bleed. This can be readdressed by Dr. [name] following discharge and improvement in her GI bleed. - plan for rate controlled. Continue Metoprolol  BID.  - TSH mild low 0.337. Will get Free T4 and T3.   3. Acute on chronic renal insufficiency with elevated BUN, stage IV - improving with diuresis. Creatinine 1.63 today.   4. Severe GI bleed - Per GI, No active bleeding, not a candidate for colonoscopy. Result as above.   5. UTI - per primary 6. HTN - relatively stable 7. HLD - continue statin 8. DM - Hgb of 7.7. Management per primary.  9. COPD on home O2 10. Hypokalemia. - supplement given.   Lorelei Pont PA-C Pager [phone]

## 2014-12-02 NOTE — Progress Notes (Signed)
Patient seen and examined. Case d/w residents in detail. I agree with findings and plan as documented in Dr. Joaquin Music note.  Patient feels better today. Cardio f/u appreciated. Patient is stable for d/c home today on PO lasix and will f/u with cardio on 7/13. Patient with new onset pAfib which is rate controlled. Will c/w metoprolol.   Hg is stable. EGD with no evidence of a bleed. Will c/w PPI and consider PO iron supplementation. Outpatient f/u of her CBC with PCP

## 2014-12-02 NOTE — Progress Notes (Addendum)
Subjective: Doing well today. She denies dyspnea or chest pain. She is ready to go home.  Objective: Vital signs in last 24 hours: Filed Vitals:   12/01/14 2118 12/01/14 2125 12/02/14 0618 12/02/14 1317  BP: 140/42  124/45 121/47  Pulse: 67 71 79 73  Temp: 99.4 F (37.4 C)  98.5 F (36.9 C) 98.8 F (37.1 C)  TempSrc: Oral  Oral Oral  Resp: Height:      Weight:   155 lb 4.8 oz (70.444 kg)   SpO2: 95%  95% 96%   Weight change: -2 lb 1.6 oz (-0.953 kg)  Intake/Output Summary (Last 24 hours) at 12/02/14 1405 Last data filed at 12/02/14 1318  Gross per 24 hour  Intake    700 ml  Output    950 ml  Net   -250 ml   General Apperance: NAD HEENT: Normocephalic, atraumatic, PERRL, EOMI, anicteric sclera Neck: Supple, trachea midline Lungs: Clear to auscultation bilaterally. No wheezes or rhonchi. Breathing comfortably Heart: Regular rate and rhythm, no murmur/rub/gallop Abdomen: Soft, nontender, nondistended, no rebound/guarding Extremities: Normal, atraumatic, warm and well perfused, 1+ BLE edema Pulses: 2+ throughout Skin: No rashes or lesions Neurologic: Alert and oriented x 3. CNII-XII intact. Normal strength and sensation  Lab Results: Basic Metabolic Panel:  Recent Labs Lab 11/30/14 1512 12/01/14 0400 12/02/14 0508  NA 141 144 145  K 3.5 3.0* 3.2*  CL 102 102 102  CO2 28 30 33*  GLUCOSE 256* 200* 164*  BUN 68* 61* 44*  CREATININE 1.95* 1.83* 1.63*  CALCIUM 8.8* 8.5* 8.4*  MG 2.3  --   --   PHOS 4.0  --   --    Liver Function Tests:  Recent Labs Lab 11/29/14 1558  AST 12*  ALT 15  ALKPHOS 81  BILITOT 0.7  PROT 5.5*  ALBUMIN 2.7*   CBC:  Recent Labs Lab 11/29/14 1420  12/01/14 0400 12/02/14 0508  WBC 9.9  < > 8.5 6.3  NEUTROABS 8.4*  --   --   --   HGB 6.0*  < > 7.5* 7.6*  HCT 19.2*  < > 23.7* 24.4*  MCV 87.3  < > 88.1 90.0  PLT 310  < > 285 295  < > = values in this interval not displayed. Cardiac Enzymes:  Recent  Labs Lab 11/30/14 0028 11/30/14 0631  TROPONINI <0.03 <0.03   CBG:  Recent Labs Lab 12/01/14 0806 12/01/14 1206 12/01/14 1637 12/01/14 2113 12/02/14 0805 12/02/14 1234  GLUCAP 159* 106* 125* 275* 149* 311*   Hemoglobin A1C:  Recent Labs Lab 11/30/14 1610  HGBA1C 7.7*   Fasting Lipid Panel: No results for input(s): CHOL, HDL, LDLCALC, TRIG, CHOLHDL, LDLDIRECT in the last 168 hours. Anemia Panel:  Recent Labs Lab 11/30/14 1610  VITAMINB12 797  FOLATE 47.7  FERRITIN 110  TIBC 312  IRON 10*  RETICCTPCT 7.7*     Urinalysis:  Recent Labs Lab 11/29/14 1736  COLORURINE YELLOW  LABSPEC 1.011  PHURINE 5.0  GLUCOSEU NEGATIVE  HGBUR NEGATIVE  BILIRUBINUR NEGATIVE  KETONESUR NEGATIVE  PROTEINUR 100*  UROBILINOGEN 0.2  NITRITE POSITIVE*  LEUKOCYTESUR LARGE*   Micro Results: Recent Results (from the past 240 hour(s))  Culture, Urine     Status: None (Preliminary result)   Collection Time: 12/01/14  3:30 PM  Result Value Ref Range Status   Specimen Description URINE, RANDOM  Final   Special Requests NONE  Final   Culture NO GROWTH <  24 HOURS  Final   Report Status PENDING  Incomplete   Studies/Results: No results found. Medications: I have reviewed the patient's current medications. Scheduled Meds: . atorvastatin  20 mg Oral QHS  . escitalopram  10 mg Oral Daily  . fenofibrate  160 mg Oral Daily  . furosemide  40 mg Oral BID  . gabapentin  100 mg Oral q morning - 10a   And  . gabapentin  200 mg Oral QHS  . insulin aspart  0-5 Units Subcutaneous QHS  . insulin aspart  0-9 Units Subcutaneous TID WC  . iron polysaccharides  150 mg Oral BID  . metoprolol tartrate  25 mg Oral BID  . pantoprazole  40 mg Oral Daily  . sodium chloride  3 mL Intravenous Q12H   Continuous Infusions:   PRN Meds:.albuterol, senna   Assessment/Plan: Active Problems:   Anemia   Acute blood loss anemia   Pressure ulcer   Atrial fibrillation   Melena   Absolute anemia    Congestive heart disease   Renal insufficiency  New onset paroxysmal atrial fibrillation: CHA2DS2-Vasc score of 6. Echo 12/01/14 showed LV EF of 55-60% -Continue metoprolol 25mg  BID. -TSH low 0.337. Will get Free T4  -Hold off on anticoagulation in setting of GI bleed. She will follow up with Dr. [name]  Symptomatic Anemia: Reports melena. Admission Hgb 5.7 and MCV 87.8. FOBT negative. She was transfused with 2 units pRBC in the ED. Baseline hgb around 10-11. Post transfusion hgb 8.3. Repeat FOBT negative. Hgb 7.5 from 8.3. Iron low. TIBC and Ferritin normal. Vitamin B12 and Folate normal. Reticulocytes elevated. LDH normal. Consistent with acute blood loss and probable underlying anemia of chronic disease. EGD 12/01/2014 found Incidental esophageal diverticulum and esophageal stricture but otherwise unremarkable. Not a good candidate for more aggressive workup. -Transfuse pRBC prn with goal Hgb above 7. Will need outpatient monitoring of hemoglobin regularly with PCP -Protonix 40mg  daily -Nu-Iron 150mg  BID and senna for possible constipation  Acute on Chronic Diastolic CHF: Her BNP is increased to 722.9 from previous 466.7 on 11/03/2014. Her weight was 165lb on admission. Her weight was 164lb on 11/22/2014 and 158 on 07/12/2014. Her weight is down to 155lb. -Continue Lasix 40mg  PO BID -Unable to continue home ASA 81mg  daily because of her yellow dye allergy and hospital version of ASA 81mg  daily has this ingredient -Continue home Lipitor 20mg  QHH, fenofibrate 160mg  daily  AKI on CKD stage 3: Admission creatinine 2.36. Baseline creatinine around 1.2 to 1.4. Improved to 1.6. Resolving. -Plan as above -Continue to monitor  UTI: Intermittent confusion prior to admission.. Denies dysuria, suprapubic pain. Difficulty to assess increased urine frequency as she is on Lasix. UA with many bacteria, large leukocytes, positive nitrites, numerous WBC. WBC down to 6.3 and remained afebrile. Mental status improved  greatly. -IV ceftriaxone x 3 days completed.  COPD: On 2L home O2, albuterol prn. -Continue albuterol neb 2 puff q6hr prn  DM2 with peripheral neuropathy: Last Hgb A1c 8% on 06/09/2013. Hgb A1c 7.7 -Hold home glipizide 10mg  daily -Continue home gabapentin 100mg  qAM and 200mg  qPM.  -SSI  HTN: On amlodipine 10mg  daily, hydralazine 25mg  BID, isosorbide mononitrate 30mg  daily, Lopressor 12.5mg  BID. BP 115/29-140-42. -Home Lopressor 25mg  BID.  Anxiety: Continue home Lexapro 10mg  daily  Pressure Ulcer: 4 cm x 3 cm x 0.1 bilateral gluteal folds. WOC consulted. Appreciate recs.  FEN: H/H carb mod  VTE ppx: SCDs  Dispo: Likely home today  The patient does have a  current PCP Elizabeth Palau, FNP) and does not need an [facility] hospital follow-up appointment after discharge.  The patient does not have transportation limitations that hinder transportation to clinic appointments.  .Services Needed at time of discharge: Y = Yes, Blank = No PT: Home Health, rolling walker  OT:   RN:   Equipment:   Other:     LOS: 3 days   Lora Paula, MD 12/02/2014, 2:05 PM

## 2014-12-02 NOTE — Discharge Instructions (Signed)
Please follow up with your heart doctor on Wednesday 12/07/2014 at 4:15PM. Please follow up with your primary care provider on Thursday 12/08/2014 at 1:30PM  1. Take 40mg  of Lasix twice a day. 2. Do not take your amlodipine, hydralazine, or isosorbide mononitrate. Please ask your heart doctor and/or primary care provider on when you should restart these medications. 3. Your metoprolol was increased to 25mg  twice a day. A new prescription was sent to your pharmacy. 4. Take Protonix 40mg  daily and Nu-Iron 150mg  twice a day.

## 2014-12-02 NOTE — Progress Notes (Signed)
Discharge instructions reviewed with patient/family. All questions answered at this time. Transport per family.   Sim Boast, RN

## 2014-12-03 LAB — URINE CULTURE: Culture: NO GROWTH

## 2014-12-07 ENCOUNTER — Ambulatory Visit (INDEPENDENT_AMBULATORY_CARE_PROVIDER_SITE_OTHER): Payer: Medicare Other | Admitting: Internal Medicine

## 2014-12-07 ENCOUNTER — Encounter: Payer: Self-pay | Admitting: Internal Medicine

## 2014-12-07 VITALS — BP 116/46 | HR 54 | Ht 62.0 in | Wt 159.1 lb

## 2014-12-07 DIAGNOSIS — D62 Acute posthemorrhagic anemia: Secondary | ICD-10-CM | POA: Diagnosis not present

## 2014-12-07 DIAGNOSIS — I481 Persistent atrial fibrillation: Secondary | ICD-10-CM

## 2014-12-07 DIAGNOSIS — I4819 Other persistent atrial fibrillation: Secondary | ICD-10-CM

## 2014-12-07 DIAGNOSIS — I5031 Acute diastolic (congestive) heart failure: Secondary | ICD-10-CM

## 2014-12-07 MED ORDER — WARFARIN SODIUM 5 MG PO TABS: 5.0000 mg | ORAL_TABLET | Freq: Every day | ORAL | Status: DC

## 2014-12-07 NOTE — Progress Notes (Signed)
OFFICE NOTE  Chief Complaint:  Hospital follow-up  Primary Care Physician: Elizabeth Palau, FNP  HPI:  Alexis [name] is a pleasant 81 year old female who was recently seen at any and for heart failure exacerbation. She presented at this couple of weeks of increasing shortness of breath, cough, increasing abdominal girth and lower extremity edema. On admission her BNP was elevated and she responded fairly quickly to diuresis. This caused improvement in her breathing. She was noted to be hypertensive and continues to be hypertensive today. She has had some acute on chronic renal insufficiency.  She reports that when she was discharged she was sent home on oxygen, but was never told why she needed it. She followed up with her primary care provider, who is with [facility] and he recommended that she see a cardiologist to help determine whether she needs to continue to take oxygen. She apparently sought out our group and on seeing her today.  Her past medical history is significant for hypertension, diabetes, dyslipidemia and family history of heart disease.  She reports that since discharge her breathing is better, however when she does marked exertion she does feel short of breath. She is not using her oxygen. She denies any chest pain.  An echocardiogram performed at [facility] showed an EF of 65-70%, with moderate concentric LVH, and no wall motion abnormalities.  She's never had a stress test evaluation.  Alexis [name] returns today for followup of her stress test. This is a nuclear stress test that showed no reversible ischemia and a preserved EF greater than 70%. She reports that she's been undergoing rehabilitation at home and feels that her shortness of breath is improving. It is now a point where she does not feel that she needs oxygen. We went ahead and ambulated her around the office today and her baseline SpO2 was 98% and decreased to 95% with exertion. This is markedly  improved from her previous office visit where her oxygen saturation dropped to 74% on room air.  Also at her last office visit I started her on amlodipine 10 mg daily and this helped both lower blood pressure and I suspect will ask her pulmonary artery pressures as well.  I saw [name] back today from her recent hospitalization. She was also due for annual follow-up. She was recently admitted for what sounds like a diastolic heart failure exacerbation, possibly precipitated by pneumonia and there was clear medication noncompliance. She was not taking her Lasix as prescribed. Also there may been some dietary noncompliance. She had about 6-8 pounds of weight gain which was diuresed down to 151 pounds on discharge. She is currently weighing 156 pounds at home and waited at 158 pounds in the office. She reports no significant shortness of breath and has been "religious" about taking her Lasix on a daily basis. She says she is eating more than she had before she is currently living with her daughter.  Alexis [name] was again hospitalized for diastolic heart failure/COPD. Again there is suspicion of possible noncompliance. She diuresed and was given antibiotics/steroids. Breathing is somewhat improved. She is currently on 3 L continuous oxygen. She has not been back to see her pulmonologist yet. She was placed on Lasix 60 mg daily after a pulse dose of Lasix 120 mg for 3 days. She is currently complaining of leg swelling and some shortness of breath with mild exertion.  I saw Alexis [name] again recently in the hospital this time in the setting of acute  GI bleeding. She was found to be anemic however a source of her bleeding was not identified. She did undergo EGD. She was also found to have new onset atrial fibrillation. Her CHADSVASC score is 6, therefore ultimately anticoagulation must be considered although at the time with her active bleeding it was not pursued. We talked at length today in the office  about anticoagulation and her stroke risk and she is deathly afraid of stroke, which is very reasonable given her risk. Given the possible options for anticoagulation, the safest option may be warfarin which could be easily reversed if recurrent bleeding is noted. She currently reports dark stools but is on twice daily iron.  PMHx:  Past Medical History  Diagnosis Date  . Hypertension   . Neuropathy   . Anxiety   . PONV (postoperative nausea and vomiting)   . CHF (congestive heart failure)   . COPD (chronic obstructive pulmonary disease)     on home, nocturnal oxygen.   . Family history of adverse reaction to anesthesia   . Cardiomyopathy   . Diabetes mellitus without complication     TYPE 2  . Macular degeneration, bilateral     Past Surgical History  Procedure Laterality Date  . Hip fracture surgery  2004  . Abdominal hysterectomy    . Vein surgery    . Esophagogastroduodenoscopy N/A 12/01/2014    Procedure: ESOPHAGOGASTRODUODENOSCOPY (EGD);  Surgeon: Hilarie Fredrickson, MD;  Location: [facility] ENDOSCOPY;  Service: Endoscopy;  Laterality: N/A;    FAMHx:  Family History  Problem Relation Age of Onset  . Colon cancer Father 83    advanced when discovered, no surgery or therapy.  this led to his death    SOCHx:   reports that she has never smoked. She has never used smokeless tobacco. She reports that she does not drink alcohol or use illicit drugs.  ALLERGIES:  Allergies  Allergen Reactions  . Other     novocaine broke mouth out  . Codeine Hives  . Penicillins Hives  . Procaine Hives  . Yellow Dyes (Non-Tartrazine) Nausea And Vomiting    "deathly allergic" No other information given to explain reaction.    ROS: A comprehensive review of systems was negative except for: Constitutional: positive for fatigue Respiratory: positive for cough, dyspnea on exertion and emphysema Cardiovascular: positive for dyspnea and lower extremity edema  HOME MEDS: Current Outpatient  Prescriptions  Medication Sig Dispense Refill  . acetaminophen (TYLENOL) 325 MG tablet Take 650 mg by mouth every 6 (six) hours as needed for mild pain.     Marland Kitchen albuterol (PROVENTIL HFA;VENTOLIN HFA) 108 (90 BASE) MCG/ACT inhaler Inhale 2 puffs into the lungs every 6 (six) hours as needed for wheezing or shortness of breath. 1 Inhaler 3  . aspirin EC 81 MG tablet Take 81 mg by mouth at bedtime.    Marland Kitchen atorvastatin (LIPITOR) 20 MG tablet Take 20 mg by mouth at bedtime.    . cholecalciferol (VITAMIN D) 1000 UNITS tablet Take 1,000 Units by mouth at bedtime.     Marland Kitchen escitalopram (LEXAPRO) 10 MG tablet Take 10 mg by mouth daily.    . fenofibrate 160 MG tablet Take 160 mg by mouth daily.    . furosemide (LASIX) 40 MG tablet Take 1 tablet (40 mg total) by mouth 2 (two) times daily. 60 tablet 0  . gabapentin (NEURONTIN) 100 MG capsule Take 100-200 mg by mouth 2 (two) times daily. Take 1 capsule (100 mg) morning, take 2 capsules (200  mg) at bedtime    . glipiZIDE (GLUCOTROL XL) 10 MG 24 hr tablet Take 10 mg by mouth daily.  11  . iron polysaccharides (NIFEREX) 150 MG capsule Take 1 capsule (150 mg total) by mouth 2 (two) times daily. 60 capsule 0  . metoprolol tartrate (LOPRESSOR) 25 MG tablet Take 1 tablet (25 mg total) by mouth 2 (two) times daily. 60 tablet 0  . Multiple Vitamin (MULTIVITAMIN WITH MINERALS) TABS tablet Take 1 tablet by mouth daily.    . OXYGEN Inhale into the lungs.    . pantoprazole (PROTONIX) 40 MG tablet Take 1 tablet (40 mg total) by mouth daily. 30 tablet 0  . Pramoxine-Menthol (GOLD BOND MAXIMUM RELIEF) 1-1 % CREA Apply 1 application topically 2 (two) times daily as needed (for would care).     No current facility-administered medications for this visit.    LABS/IMAGING: No results found for this or any previous visit (from the past 48 hour(s)). No results found.  VITALS: BP 116/46 mmHg  Pulse 54  Ht 5\' 2"  (1.575 m)  Wt 159 lb 1.6 oz (72.167 kg)  BMI 29.09  kg/m2  EXAM: General appearance: alert, no distress and On 3 L oxygen nasal cannula Neck: JVD - 3 cm above sternal notch and no carotid bruit Lungs: diminished breath sounds bibasilar Heart: regular rate and rhythm, S1, S2 normal, no murmur, click, rub or gallop Abdomen: soft, non-tender; bowel sounds normal; no masses,  no organomegaly Extremities: edema 2+ left lower extremity 1+ right lower extremity edema Pulses: 2+ and symmetric Skin: Skin color, texture, turgor normal. No rashes or lesions Neurologic: Grossly normal Psych: Mood, affect normal  EKG: A-fib at 56  ASSESSMENT: 1. New onset atrial fibrillation-CHADSVASC score of 6 2. Recent anemia-unknown source 3. Recent acute diastolic heart failure exacerbation 4. Hypertensive heart disease with moderate LVH 5. Hypertension - controlled 6. Dyslipidemia 7. Diabetes type 2 8. Family history of heart disease 9. COPD on 3 L O2 continuous  PLAN: 1.   Mrs. Burdge was again admitted this time for an anemia of unknown source. It was thought to be due to GI bleeding. She was not on any anticoagulation. There is associated new onset atrial fibrillation which she is in persistently. Her CHADSVASC score is 6, therefore she is at very high risk of stroke greater than 10% annually. When compare that to her HAS-BLED score, it would seem that it would be more favorable to consider anticoagulation given no clear source of her anemia. We discussed alternatives for anticoagulation it seems that the safest option may be warfarin which could be reversed if she has some obvious GI bleeding. I recommend starting on warfarin with a goal INR in the low end of the 2-3 range.  Chrystie Nose, MD, [facility] Attending Cardiologist [facility]  Chrystie Nose 12/07/2014, 5:14 PM

## 2014-12-07 NOTE — Patient Instructions (Signed)
Your physician recommends that you schedule a follow-up appointment in: 3 months with Dr. [name]  We are starting you on coumadin and we want you to come in on Monday to have you coumadin  Level checked

## 2014-12-09 NOTE — Addendum Note (Signed)
Addended by: Willia Craze on: 12/09/2014 08:04 AM   Modules accepted: Orders

## 2014-12-12 ENCOUNTER — Ambulatory Visit (INDEPENDENT_AMBULATORY_CARE_PROVIDER_SITE_OTHER): Payer: Medicare Other | Admitting: Pharmacist Clinician (PhC)/ Clinical Pharmacy Specialist

## 2014-12-12 DIAGNOSIS — Z7901 Long term (current) use of anticoagulants: Secondary | ICD-10-CM | POA: Diagnosis not present

## 2014-12-12 DIAGNOSIS — I481 Persistent atrial fibrillation: Secondary | ICD-10-CM | POA: Diagnosis not present

## 2014-12-12 DIAGNOSIS — I4819 Other persistent atrial fibrillation: Secondary | ICD-10-CM

## 2014-12-12 LAB — POCT INR: INR: 2.2

## 2014-12-13 ENCOUNTER — Inpatient Hospital Stay (HOSPITAL_COMMUNITY)
Admission: EM | Admit: 2014-12-13 | Discharge: 2014-12-17 | DRG: 291 | Disposition: A | Discharge status: Home / Self Care | Payer: Medicare Other | Attending: Internal Medicine | Admitting: Internal Medicine

## 2014-12-13 ENCOUNTER — Emergency Department (HOSPITAL_COMMUNITY): Payer: Medicare Other

## 2014-12-13 ENCOUNTER — Encounter (HOSPITAL_COMMUNITY): Payer: Self-pay | Admitting: Emergency Medicine

## 2014-12-13 DIAGNOSIS — Z90710 Acquired absence of both cervix and uterus: Secondary | ICD-10-CM

## 2014-12-13 DIAGNOSIS — I481 Persistent atrial fibrillation: Secondary | ICD-10-CM | POA: Diagnosis present

## 2014-12-13 DIAGNOSIS — Z880 Allergy status to penicillin: Secondary | ICD-10-CM

## 2014-12-13 DIAGNOSIS — G4733 Obstructive sleep apnea (adult) (pediatric): Secondary | ICD-10-CM | POA: Diagnosis present

## 2014-12-13 DIAGNOSIS — Z9111 Patient's noncompliance with dietary regimen: Secondary | ICD-10-CM | POA: Diagnosis present

## 2014-12-13 DIAGNOSIS — J9620 Acute and chronic respiratory failure, unspecified whether with hypoxia or hypercapnia: Secondary | ICD-10-CM | POA: Diagnosis present

## 2014-12-13 DIAGNOSIS — Z91048 Other nonmedicinal substance allergy status: Secondary | ICD-10-CM

## 2014-12-13 DIAGNOSIS — I130 Hypertensive heart and chronic kidney disease with heart failure and stage 1 through stage 4 chronic kidney disease, or unspecified chronic kidney disease: Secondary | ICD-10-CM | POA: Diagnosis not present

## 2014-12-13 DIAGNOSIS — Z9981 Dependence on supplemental oxygen: Secondary | ICD-10-CM

## 2014-12-13 DIAGNOSIS — I429 Cardiomyopathy, unspecified: Secondary | ICD-10-CM | POA: Diagnosis present

## 2014-12-13 DIAGNOSIS — J9622 Acute and chronic respiratory failure with hypercapnia: Secondary | ICD-10-CM | POA: Diagnosis present

## 2014-12-13 DIAGNOSIS — Z885 Allergy status to narcotic agent status: Secondary | ICD-10-CM

## 2014-12-13 DIAGNOSIS — E1122 Type 2 diabetes mellitus with diabetic chronic kidney disease: Secondary | ICD-10-CM | POA: Diagnosis present

## 2014-12-13 DIAGNOSIS — E876 Hypokalemia: Secondary | ICD-10-CM | POA: Diagnosis not present

## 2014-12-13 DIAGNOSIS — H3530 Unspecified macular degeneration: Secondary | ICD-10-CM | POA: Diagnosis present

## 2014-12-13 DIAGNOSIS — F419 Anxiety disorder, unspecified: Secondary | ICD-10-CM | POA: Diagnosis present

## 2014-12-13 DIAGNOSIS — J9621 Acute and chronic respiratory failure with hypoxia: Secondary | ICD-10-CM | POA: Diagnosis present

## 2014-12-13 DIAGNOSIS — Z9114 Patient's other noncompliance with medication regimen: Secondary | ICD-10-CM | POA: Diagnosis present

## 2014-12-13 DIAGNOSIS — D509 Iron deficiency anemia, unspecified: Secondary | ICD-10-CM | POA: Diagnosis present

## 2014-12-13 DIAGNOSIS — N184 Chronic kidney disease, stage 4 (severe): Secondary | ICD-10-CM | POA: Diagnosis present

## 2014-12-13 DIAGNOSIS — I5030 Unspecified diastolic (congestive) heart failure: Secondary | ICD-10-CM

## 2014-12-13 DIAGNOSIS — I5033 Acute on chronic diastolic (congestive) heart failure: Secondary | ICD-10-CM | POA: Diagnosis not present

## 2014-12-13 DIAGNOSIS — I480 Paroxysmal atrial fibrillation: Secondary | ICD-10-CM | POA: Diagnosis present

## 2014-12-13 DIAGNOSIS — E0849 Diabetes mellitus due to underlying condition with other diabetic neurological complication: Secondary | ICD-10-CM | POA: Diagnosis present

## 2014-12-13 DIAGNOSIS — Z888 Allergy status to other drugs, medicaments and biological substances status: Secondary | ICD-10-CM

## 2014-12-13 DIAGNOSIS — I2510 Atherosclerotic heart disease of native coronary artery without angina pectoris: Secondary | ICD-10-CM | POA: Diagnosis present

## 2014-12-13 DIAGNOSIS — I509 Heart failure, unspecified: Secondary | ICD-10-CM

## 2014-12-13 DIAGNOSIS — J449 Chronic obstructive pulmonary disease, unspecified: Secondary | ICD-10-CM | POA: Diagnosis present

## 2014-12-13 DIAGNOSIS — N289 Disorder of kidney and ureter, unspecified: Secondary | ICD-10-CM

## 2014-12-13 DIAGNOSIS — Z79899 Other long term (current) drug therapy: Secondary | ICD-10-CM

## 2014-12-13 DIAGNOSIS — Z7982 Long term (current) use of aspirin: Secondary | ICD-10-CM

## 2014-12-13 DIAGNOSIS — K219 Gastro-esophageal reflux disease without esophagitis: Secondary | ICD-10-CM | POA: Diagnosis present

## 2014-12-13 DIAGNOSIS — N179 Acute kidney failure, unspecified: Secondary | ICD-10-CM | POA: Diagnosis present

## 2014-12-13 DIAGNOSIS — Z7901 Long term (current) use of anticoagulants: Secondary | ICD-10-CM

## 2014-12-13 DIAGNOSIS — E1140 Type 2 diabetes mellitus with diabetic neuropathy, unspecified: Secondary | ICD-10-CM | POA: Diagnosis present

## 2014-12-13 DIAGNOSIS — E0590 Thyrotoxicosis, unspecified without thyrotoxic crisis or storm: Secondary | ICD-10-CM | POA: Diagnosis present

## 2014-12-13 DIAGNOSIS — E1165 Type 2 diabetes mellitus with hyperglycemia: Secondary | ICD-10-CM | POA: Diagnosis present

## 2014-12-13 DIAGNOSIS — H109 Unspecified conjunctivitis: Secondary | ICD-10-CM | POA: Diagnosis not present

## 2014-12-13 DIAGNOSIS — I10 Essential (primary) hypertension: Secondary | ICD-10-CM | POA: Diagnosis present

## 2014-12-13 LAB — CBC
HCT: 30.1 % — ABNORMAL LOW (ref 36.0–46.0)
HEMOGLOBIN: 9.1 g/dL — AB (ref 12.0–15.0)
MCH: 26.5 pg (ref 26.0–34.0)
MCHC: 30.2 g/dL (ref 30.0–36.0)
MCV: 87.8 fL (ref 78.0–100.0)
Platelets: 339 10*3/uL (ref 150–400)
RBC: 3.43 MIL/uL — ABNORMAL LOW (ref 3.87–5.11)
RDW: 17.6 % — ABNORMAL HIGH (ref 11.5–15.5)
WBC: 9.5 10*3/uL (ref 4.0–10.5)

## 2014-12-13 LAB — I-STAT CHEM 8, ED
BUN: 35 mg/dL — ABNORMAL HIGH (ref 6–20)
Calcium, Ion: 1.06 mmol/L — ABNORMAL LOW (ref 1.13–1.30)
Chloride: 96 mmol/L — ABNORMAL LOW (ref 101–111)
Creatinine, Ser: 2.4 mg/dL — ABNORMAL HIGH (ref 0.44–1.00)
Glucose, Bld: 250 mg/dL — ABNORMAL HIGH (ref 65–99)
HCT: 30 % — ABNORMAL LOW (ref 36.0–46.0)
HEMOGLOBIN: 10.2 g/dL — AB (ref 12.0–15.0)
Potassium: 3.5 mmol/L (ref 3.5–5.1)
SODIUM: 139 mmol/L (ref 135–145)
TCO2: 29 mmol/L (ref 0–100)

## 2014-12-13 LAB — BASIC METABOLIC PANEL
ANION GAP: 12 (ref 5–15)
BUN: 32 mg/dL — ABNORMAL HIGH (ref 6–20)
CHLORIDE: 97 mmol/L — AB (ref 101–111)
CO2: 30 mmol/L (ref 22–32)
CREATININE: 2.54 mg/dL — AB (ref 0.44–1.00)
Calcium: 8.5 mg/dL — ABNORMAL LOW (ref 8.9–10.3)
GFR, EST AFRICAN AMERICAN: 19 mL/min — AB (ref >60)
GFR, EST NON AFRICAN AMERICAN: 17 mL/min — AB (ref >60)
Glucose, Bld: 250 mg/dL — ABNORMAL HIGH (ref 65–99)
POTASSIUM: 3.6 mmol/L (ref 3.5–5.1)
SODIUM: 139 mmol/L (ref 135–145)

## 2014-12-13 LAB — BRAIN NATRIURETIC PEPTIDE: B NATRIURETIC PEPTIDE 5: 581.2 pg/mL — AB (ref 0.0–100.0)

## 2014-12-13 LAB — I-STAT ARTERIAL BLOOD GAS, ED
ACID-BASE EXCESS: 6 mmol/L — AB (ref 0.0–2.0)
ACID-BASE EXCESS: 8 mmol/L — AB (ref 0.0–2.0)
Bicarbonate: 33.5 mEq/L — ABNORMAL HIGH (ref 20.0–24.0)
Bicarbonate: 33.6 mEq/L — ABNORMAL HIGH (ref 20.0–24.0)
O2 Saturation: 92 %
O2 Saturation: 97 %
PCO2 ART: 63.4 mmHg — AB (ref 35.0–45.0)
PH ART: 7.411 (ref 7.350–7.450)
TCO2: 35 mmol/L (ref 0–100)
TCO2: 35 mmol/L (ref 0–100)
pCO2 arterial: 52.7 mmHg — ABNORMAL HIGH (ref 35.0–45.0)
pH, Arterial: 7.329 — ABNORMAL LOW (ref 7.350–7.450)
pO2, Arterial: 68 mmHg — ABNORMAL LOW (ref 80.0–100.0)
pO2, Arterial: 91 mmHg (ref 80.0–100.0)

## 2014-12-13 LAB — PROTIME-INR
INR: 2.44 — ABNORMAL HIGH (ref 0.00–1.49)
PROTHROMBIN TIME: 26.2 s — AB (ref 11.6–15.2)

## 2014-12-13 LAB — I-STAT TROPONIN, ED: Troponin i, poc: 0.01 ng/mL (ref 0.00–0.08)

## 2014-12-13 MED ORDER — FUROSEMIDE 10 MG/ML IJ SOLN
80.0000 mg | Freq: Once | INTRAMUSCULAR | Status: AC
  Administered 2014-12-13: 80 mg via INTRAVENOUS
  Filled 2014-12-13: qty 8

## 2014-12-13 MED ORDER — ALBUTEROL SULFATE (2.5 MG/3ML) 0.083% IN NEBU
5.0000 mg | INHALATION_SOLUTION | Freq: Once | RESPIRATORY_TRACT | Status: AC
  Administered 2014-12-13: 5 mg via RESPIRATORY_TRACT
  Filled 2014-12-13: qty 6

## 2014-12-13 MED ORDER — POTASSIUM CHLORIDE CRYS ER 20 MEQ PO TBCR: 20.0000 meq | EXTENDED_RELEASE_TABLET | Freq: Once | ORAL | Status: DC

## 2014-12-13 NOTE — Progress Notes (Signed)
Pt placed on bipap 12/6 50% at 1930. ABG obtained CO2 is 63.4 . Adjustments to setting made are ipap 16/epap 6 45%.  Pt tolerating at this time.  RN aware of ABG and changes made.

## 2014-12-13 NOTE — ED Notes (Signed)
RT at bedside.

## 2014-12-13 NOTE — ED Provider Notes (Signed)
CSN: [id number]     Arrival date & time 12/13/14  1923 History   First MD Initiated Contact with Patient 12/13/14 1958     Chief Complaint  Patient presents with  . Shortness of Breath  . Congestive Heart Failure   level V caveat secondary to respiratory distress (Consider location/radiation/quality/duration/timing/severity/associated sxs/prior Treatment) HPI 81 year old female history of congestive heart failure presents today with a set of worsening shortness of breath over several hours this afternoon. On oxygen at home but came in because she was severely dyspneic even on her oxygen. She denies pain. She has chronic pitting edema that she does not think is worse in usual. She denies any fever or chills. Cough is at baseline. Past Medical History  Diagnosis Date  . Hypertension   . Neuropathy   . Anxiety   . PONV (postoperative nausea and vomiting)   . CHF (congestive heart failure)   . COPD (chronic obstructive pulmonary disease)     on home, nocturnal oxygen.   . Family history of adverse reaction to anesthesia   . Cardiomyopathy   . Diabetes mellitus without complication     TYPE 2  . Macular degeneration, bilateral    Past Surgical History  Procedure Laterality Date  . Hip fracture surgery  2004  . Abdominal hysterectomy    . Vein surgery    . Esophagogastroduodenoscopy N/A 12/01/2014    Procedure: ESOPHAGOGASTRODUODENOSCOPY (EGD);  Surgeon: Hilarie Fredrickson, MD;  Location: [facility] ENDOSCOPY;  Service: Endoscopy;  Laterality: N/A;   Family History  Problem Relation Age of Onset  . Colon cancer Father 72    advanced when discovered, no surgery or therapy.  this led to his death   History  Substance Use Topics  . Smoking status: Never Smoker   . Smokeless tobacco: Never Used  . Alcohol Use: No   OB History    No data available     Review of Systems  All other systems reviewed and are negative.     Allergies  Other; Codeine; Penicillins; Procaine; and Yellow dyes  (non-tartrazine)  Home Medications   Prior to Admission medications   Medication Sig Start Date End Date Taking? Authorizing Provider  acetaminophen (TYLENOL) 325 MG tablet Take 650 mg by mouth every 6 (six) hours as needed for mild pain.     Historical Provider, MD  albuterol (PROVENTIL HFA;VENTOLIN HFA) 108 (90 BASE) MCG/ACT inhaler Inhale 2 puffs into the lungs every 6 (six) hours as needed for wheezing or shortness of breath. 06/11/13   Nishant Dhungel, MD  amLODipine (NORVASC) 10 MG tablet Take 10 mg by mouth daily. 12/10/14   Historical Provider, MD  aspirin EC 81 MG tablet Take 81 mg by mouth at bedtime.    Historical Provider, MD  atorvastatin (LIPITOR) 20 MG tablet Take 20 mg by mouth at bedtime. 10/12/14 10/12/15  Historical Provider, MD  cholecalciferol (VITAMIN D) 1000 UNITS tablet Take 1,000 Units by mouth at bedtime.     Historical Provider, MD  escitalopram (LEXAPRO) 10 MG tablet Take 10 mg by mouth daily.    Historical Provider, MD  fenofibrate 160 MG tablet Take 160 mg by mouth daily.    Historical Provider, MD  furosemide (LASIX) 40 MG tablet Take 1 tablet (40 mg total) by mouth 2 (two) times daily. 12/02/14   Lora Paula, MD  gabapentin (NEURONTIN) 100 MG capsule Take 100-200 mg by mouth 2 (two) times daily. Take 1 capsule (100 mg) morning, take 2 capsules (200 mg)  at bedtime    Historical Provider, MD  glipiZIDE (GLUCOTROL XL) 10 MG 24 hr tablet Take 10 mg by mouth daily. 10/11/14   Historical Provider, MD  hydrALAZINE (APRESOLINE) 25 MG tablet Take 25 mg by mouth 2 (two) times daily. 12/05/14   Historical Provider, MD  iron polysaccharides (NIFEREX) 150 MG capsule Take 1 capsule (150 mg total) by mouth 2 (two) times daily. 12/02/14   Lora Paula, MD  isosorbide mononitrate (IMDUR) 30 MG 24 hr tablet Take 30 mg by mouth daily. 12/05/14   Historical Provider, MD  metoprolol tartrate (LOPRESSOR) 25 MG tablet Take 1 tablet (25 mg total) by mouth 2 (two) times daily. 12/02/14    Lora Paula, MD  Multiple Vitamin (MULTIVITAMIN WITH MINERALS) TABS tablet Take 1 tablet by mouth daily.    Historical Provider, MD  OXYGEN Inhale into the lungs.    Historical Provider, MD  pantoprazole (PROTONIX) 40 MG tablet Take 1 tablet (40 mg total) by mouth daily. 12/02/14   Lora Paula, MD  Pramoxine-Menthol (GOLD BOND MAXIMUM RELIEF) 1-1 % CREA Apply 1 application topically 2 (two) times daily as needed (for would care).    Historical Provider, MD  warfarin (COUMADIN) 5 MG tablet Take 1 tablet (5 mg total) by mouth daily. 12/07/14   Chrystie Nose, MD   BP 112/61 mmHg  Pulse 85  Temp(Src) 99.1 F (37.3 C) (Oral)  Resp 33  SpO2 93% Physical Exam  Constitutional: She is oriented to person, place, and time. She appears well-developed and well-nourished. She appears distressed.  HENT:  Head: Normocephalic and atraumatic.  Right Ear: External ear normal.  Left Ear: External ear normal.  Nose: Nose normal.  Mouth/Throat: Oropharynx is clear and moist.  Eyes: Pupils are equal, round, and reactive to light.  Neck: Normal range of motion.  Cardiovascular: Normal rate and normal heart sounds.   Pulmonary/Chest: She is in respiratory distress.  Crackles one half lung bases.  Abdominal: Soft. Bowel sounds are normal.  Musculoskeletal: She exhibits edema.  Neurological: She is alert and oriented to person, place, and time.  Skin: Skin is warm.  Psychiatric: She has a normal mood and affect.  Nursing note and vitals reviewed.   ED Course  Procedures (including critical care time) Labs Review Labs Reviewed  BASIC METABOLIC PANEL - Abnormal; Notable for the following:    Chloride 97 (*)    Glucose, Bld 250 (*)    BUN 32 (*)    Creatinine, Ser 2.54 (*)    Calcium 8.5 (*)    GFR calc non Af Amer 17 (*)    GFR calc Af Amer 19 (*)    All other components within normal limits  CBC - Abnormal; Notable for the following:    RBC 3.43 (*)    Hemoglobin 9.1 (*)    HCT 30.1  (*)    RDW 17.6 (*)    All other components within normal limits  PROTIME-INR - Abnormal; Notable for the following:    Prothrombin Time 26.2 (*)    INR 2.44 (*)    All other components within normal limits  BRAIN NATRIURETIC PEPTIDE - Abnormal; Notable for the following:    B Natriuretic Peptide 581.2 (*)    All other components within normal limits  I-STAT CHEM 8, ED - Abnormal; Notable for the following:    Chloride 96 (*)    BUN 35 (*)    Creatinine, Ser 2.40 (*)    Glucose, Bld 250 (*)  Calcium, Ion 1.06 (*)    Hemoglobin 10.2 (*)    HCT 30.0 (*)    All other components within normal limits  I-STAT ARTERIAL BLOOD GAS, ED - Abnormal; Notable for the following:    pH, Arterial 7.329 (*)    pCO2 arterial 63.4 (*)    pO2, Arterial 68.0 (*)    Bicarbonate 33.5 (*)    Acid-Base Excess 6.0 (*)    All other components within normal limits  I-STAT TROPOININ, ED  I-STAT TROPOININ, ED    Imaging Review Dg Chest Portable 1 View  12/13/2014   CLINICAL DATA:  Worsening shortness of breath, chest tightness  EXAM: PORTABLE CHEST - 1 VIEW  COMPARISON:  11/29/2014  FINDINGS: Patchy airspace opacities throughout both lungs involving bases more than apices, increased since previous exam. There is blunting of the right lateral costophrenic angle suggesting small effusion. Mild cardiomegaly stable. Multiple old left rib fractures. No pneumothorax.  IMPRESSION: 1. Progressive bilateral edema/infiltrates with probable small right effusion.   Electronically Signed   By: Corlis Leak M.D.   On: 12/13/2014 20:32     EKG Interpretation   Date/Time:  Tuesday December 13 2014 19:44:21 EDT Ventricular Rate:  89 PR Interval:  62 QRS Duration: 85 QT Interval:  382 QTC Calculation: 465 R Axis:   66 Text Interpretation:  Sinus rhythm Consider right atrial enlargement  Confirmed by Jerolene Kupfer MD, Duwayne Heck [phone]) on 12/13/2014 9:10:56 PM      MDM   Final diagnoses:  Acute on chronic congestive heart  failure, unspecified congestive heart failure type    1- respiratory distress-  Secondary to pulmonary edema.  Patient on bipap and given lasix with clinical improvement.  Repeat abg pending    2- renal insufficiency. - appears acutely worsening with today's at 2.5 and most recent at 1.6 3- anemia- hematoma and has improved from last prior with hemoglobin today at 10.2  Discussed with Dr. Daneil Dolin. They will see in consult. Advises admit to medicine service. Unassigned medicine paged.  Margarita Grizzle, MD 12/13/14 [phone]

## 2014-12-13 NOTE — ED Notes (Signed)
Pt. reports worsening SOB /chest tightness onset this evening , denies cough , no nausea or diaphoresis . Denies fever or chills.

## 2014-12-13 NOTE — ED Notes (Signed)
MD at bedside. 

## 2014-12-13 NOTE — ED Notes (Signed)
Called RT about patient's albuterol.

## 2014-12-13 NOTE — ED Notes (Signed)
Pt placed on Bipap by resp. 12/6 50%.

## 2014-12-14 DIAGNOSIS — J9621 Acute and chronic respiratory failure with hypoxia: Secondary | ICD-10-CM | POA: Diagnosis present

## 2014-12-14 DIAGNOSIS — I480 Paroxysmal atrial fibrillation: Secondary | ICD-10-CM | POA: Diagnosis not present

## 2014-12-14 DIAGNOSIS — E1165 Type 2 diabetes mellitus with hyperglycemia: Secondary | ICD-10-CM | POA: Diagnosis present

## 2014-12-14 DIAGNOSIS — Z9111 Patient's noncompliance with dietary regimen: Secondary | ICD-10-CM | POA: Diagnosis present

## 2014-12-14 DIAGNOSIS — E1122 Type 2 diabetes mellitus with diabetic chronic kidney disease: Secondary | ICD-10-CM | POA: Diagnosis present

## 2014-12-14 DIAGNOSIS — Z880 Allergy status to penicillin: Secondary | ICD-10-CM | POA: Diagnosis not present

## 2014-12-14 DIAGNOSIS — G4733 Obstructive sleep apnea (adult) (pediatric): Secondary | ICD-10-CM | POA: Diagnosis present

## 2014-12-14 DIAGNOSIS — I4891 Unspecified atrial fibrillation: Secondary | ICD-10-CM | POA: Diagnosis not present

## 2014-12-14 DIAGNOSIS — J438 Other emphysema: Secondary | ICD-10-CM

## 2014-12-14 DIAGNOSIS — K219 Gastro-esophageal reflux disease without esophagitis: Secondary | ICD-10-CM | POA: Diagnosis present

## 2014-12-14 DIAGNOSIS — Z91048 Other nonmedicinal substance allergy status: Secondary | ICD-10-CM | POA: Diagnosis not present

## 2014-12-14 DIAGNOSIS — Z9981 Dependence on supplemental oxygen: Secondary | ICD-10-CM | POA: Diagnosis not present

## 2014-12-14 DIAGNOSIS — E1140 Type 2 diabetes mellitus with diabetic neuropathy, unspecified: Secondary | ICD-10-CM | POA: Diagnosis present

## 2014-12-14 DIAGNOSIS — I2510 Atherosclerotic heart disease of native coronary artery without angina pectoris: Secondary | ICD-10-CM | POA: Diagnosis present

## 2014-12-14 DIAGNOSIS — E0590 Thyrotoxicosis, unspecified without thyrotoxic crisis or storm: Secondary | ICD-10-CM | POA: Diagnosis present

## 2014-12-14 DIAGNOSIS — I10 Essential (primary) hypertension: Secondary | ICD-10-CM | POA: Diagnosis not present

## 2014-12-14 DIAGNOSIS — E1142 Type 2 diabetes mellitus with diabetic polyneuropathy: Secondary | ICD-10-CM

## 2014-12-14 DIAGNOSIS — Z794 Long term (current) use of insulin: Secondary | ICD-10-CM

## 2014-12-14 DIAGNOSIS — J9622 Acute and chronic respiratory failure with hypercapnia: Secondary | ICD-10-CM

## 2014-12-14 DIAGNOSIS — I130 Hypertensive heart and chronic kidney disease with heart failure and stage 1 through stage 4 chronic kidney disease, or unspecified chronic kidney disease: Principal | ICD-10-CM

## 2014-12-14 DIAGNOSIS — Z90710 Acquired absence of both cervix and uterus: Secondary | ICD-10-CM | POA: Diagnosis not present

## 2014-12-14 DIAGNOSIS — J449 Chronic obstructive pulmonary disease, unspecified: Secondary | ICD-10-CM

## 2014-12-14 DIAGNOSIS — Z7982 Long term (current) use of aspirin: Secondary | ICD-10-CM | POA: Diagnosis not present

## 2014-12-14 DIAGNOSIS — Z79899 Other long term (current) drug therapy: Secondary | ICD-10-CM | POA: Diagnosis not present

## 2014-12-14 DIAGNOSIS — I1310 Hypertensive heart and chronic kidney disease without heart failure, with stage 1 through stage 4 chronic kidney disease, or unspecified chronic kidney disease: Secondary | ICD-10-CM | POA: Insufficient documentation

## 2014-12-14 DIAGNOSIS — I481 Persistent atrial fibrillation: Secondary | ICD-10-CM

## 2014-12-14 DIAGNOSIS — E876 Hypokalemia: Secondary | ICD-10-CM | POA: Diagnosis not present

## 2014-12-14 DIAGNOSIS — Z7901 Long term (current) use of anticoagulants: Secondary | ICD-10-CM

## 2014-12-14 DIAGNOSIS — H109 Unspecified conjunctivitis: Secondary | ICD-10-CM | POA: Diagnosis not present

## 2014-12-14 DIAGNOSIS — N179 Acute kidney failure, unspecified: Secondary | ICD-10-CM | POA: Diagnosis present

## 2014-12-14 DIAGNOSIS — J9620 Acute and chronic respiratory failure, unspecified whether with hypoxia or hypercapnia: Secondary | ICD-10-CM | POA: Diagnosis not present

## 2014-12-14 DIAGNOSIS — Z7951 Long term (current) use of inhaled steroids: Secondary | ICD-10-CM

## 2014-12-14 DIAGNOSIS — I509 Heart failure, unspecified: Secondary | ICD-10-CM

## 2014-12-14 DIAGNOSIS — N184 Chronic kidney disease, stage 4 (severe): Secondary | ICD-10-CM | POA: Diagnosis present

## 2014-12-14 DIAGNOSIS — Z9114 Patient's other noncompliance with medication regimen: Secondary | ICD-10-CM | POA: Diagnosis present

## 2014-12-14 DIAGNOSIS — I429 Cardiomyopathy, unspecified: Secondary | ICD-10-CM | POA: Diagnosis present

## 2014-12-14 DIAGNOSIS — Z885 Allergy status to narcotic agent status: Secondary | ICD-10-CM | POA: Diagnosis not present

## 2014-12-14 DIAGNOSIS — Z888 Allergy status to other drugs, medicaments and biological substances status: Secondary | ICD-10-CM | POA: Diagnosis not present

## 2014-12-14 DIAGNOSIS — F419 Anxiety disorder, unspecified: Secondary | ICD-10-CM | POA: Diagnosis present

## 2014-12-14 DIAGNOSIS — H3530 Unspecified macular degeneration: Secondary | ICD-10-CM | POA: Diagnosis present

## 2014-12-14 DIAGNOSIS — N183 Chronic kidney disease, stage 3 (moderate): Secondary | ICD-10-CM

## 2014-12-14 DIAGNOSIS — I5033 Acute on chronic diastolic (congestive) heart failure: Secondary | ICD-10-CM

## 2014-12-14 DIAGNOSIS — N289 Disorder of kidney and ureter, unspecified: Secondary | ICD-10-CM

## 2014-12-14 DIAGNOSIS — D509 Iron deficiency anemia, unspecified: Secondary | ICD-10-CM

## 2014-12-14 DIAGNOSIS — E0840 Diabetes mellitus due to underlying condition with diabetic neuropathy, unspecified: Secondary | ICD-10-CM | POA: Diagnosis not present

## 2014-12-14 LAB — URINALYSIS, ROUTINE W REFLEX MICROSCOPIC
Bilirubin Urine: NEGATIVE
Glucose, UA: NEGATIVE mg/dL
HGB URINE DIPSTICK: NEGATIVE
KETONES UR: NEGATIVE mg/dL
Leukocytes, UA: NEGATIVE
Nitrite: NEGATIVE
PH: 6.5 (ref 5.0–8.0)
Protein, ur: 100 mg/dL — AB
Specific Gravity, Urine: 1.01 (ref 1.005–1.030)
UROBILINOGEN UA: 0.2 mg/dL (ref 0.0–1.0)

## 2014-12-14 LAB — LIPID PANEL
CHOLESTEROL: 126 mg/dL (ref 0–200)
HDL: 40 mg/dL — AB (ref >40)
LDL Cholesterol: 72 mg/dL (ref 0–99)
TRIGLYCERIDES: 71 mg/dL (ref <150)
Total CHOL/HDL Ratio: 3.2 RATIO
VLDL: 14 mg/dL (ref 0–40)

## 2014-12-14 LAB — COMPREHENSIVE METABOLIC PANEL
ALT: 12 U/L — ABNORMAL LOW (ref 14–54)
AST: 16 U/L (ref 15–41)
Albumin: 2.9 g/dL — ABNORMAL LOW (ref 3.5–5.0)
Alkaline Phosphatase: 60 U/L (ref 38–126)
Anion gap: 10 (ref 5–15)
BUN: 30 mg/dL — AB (ref 6–20)
CHLORIDE: 95 mmol/L — AB (ref 101–111)
CO2: 35 mmol/L — AB (ref 22–32)
CREATININE: 2.34 mg/dL — AB (ref 0.44–1.00)
Calcium: 8.4 mg/dL — ABNORMAL LOW (ref 8.9–10.3)
GFR, EST AFRICAN AMERICAN: 21 mL/min — AB (ref >60)
GFR, EST NON AFRICAN AMERICAN: 19 mL/min — AB (ref >60)
GLUCOSE: 242 mg/dL — AB (ref 65–99)
POTASSIUM: 3.2 mmol/L — AB (ref 3.5–5.1)
Sodium: 140 mmol/L (ref 135–145)
TOTAL PROTEIN: 5.8 g/dL — AB (ref 6.5–8.1)
Total Bilirubin: 0.9 mg/dL (ref 0.3–1.2)

## 2014-12-14 LAB — GLUCOSE, CAPILLARY
GLUCOSE-CAPILLARY: 197 mg/dL — AB (ref 65–99)
Glucose-Capillary: 291 mg/dL — ABNORMAL HIGH (ref 65–99)
Glucose-Capillary: 267 mg/dL — ABNORMAL HIGH (ref 65–99)
Glucose-Capillary: 185 mg/dL — ABNORMAL HIGH (ref 65–99)

## 2014-12-14 LAB — PROCALCITONIN: Procalcitonin: <0.1 ng/mL

## 2014-12-14 LAB — CBC
HEMATOCRIT: 27.5 % — AB (ref 36.0–46.0)
Hemoglobin: 8.3 g/dL — ABNORMAL LOW (ref 12.0–15.0)
MCH: 26.4 pg (ref 26.0–34.0)
MCHC: 30.2 g/dL (ref 30.0–36.0)
MCV: 87.6 fL (ref 78.0–100.0)
Platelets: 304 10*3/uL (ref 150–400)
RBC: 3.14 MIL/uL — ABNORMAL LOW (ref 3.87–5.11)
RDW: 17.7 % — ABNORMAL HIGH (ref 11.5–15.5)
WBC: 8.6 10*3/uL (ref 4.0–10.5)

## 2014-12-14 LAB — TROPONIN I: TROPONIN I: 0.03 ng/mL (ref <0.031)

## 2014-12-14 LAB — URINE MICROSCOPIC-ADD ON

## 2014-12-14 LAB — PROTIME-INR
INR: 2.86 — ABNORMAL HIGH (ref 0.00–1.49)
Prothrombin Time: 29.5 seconds — ABNORMAL HIGH (ref 11.6–15.2)

## 2014-12-14 LAB — CBG MONITORING, ED: GLUCOSE-CAPILLARY: 222 mg/dL — AB (ref 65–99)

## 2014-12-14 LAB — MAGNESIUM: Magnesium: 2.1 mg/dL (ref 1.7–2.4)

## 2014-12-14 MED ORDER — INSULIN ASPART 100 UNIT/ML ~~LOC~~ SOLN
0.0000 [IU] | Freq: Three times a day (TID) | SUBCUTANEOUS | Status: DC
  Administered 2014-12-14: 5 [IU] via SUBCUTANEOUS
  Administered 2014-12-14: 2 [IU] via SUBCUTANEOUS
  Administered 2014-12-14 – 2014-12-15 (×2): 5 [IU] via SUBCUTANEOUS
  Administered 2014-12-15: 1 [IU] via SUBCUTANEOUS
  Administered 2014-12-16: 2 [IU] via SUBCUTANEOUS
  Administered 2014-12-16: 1 [IU] via SUBCUTANEOUS
  Administered 2014-12-17: 2 [IU] via SUBCUTANEOUS
  Administered 2014-12-17: 1 [IU] via SUBCUTANEOUS

## 2014-12-14 MED ORDER — GABAPENTIN 100 MG PO CAPS
100.0000 mg | ORAL_CAPSULE | Freq: Every day | ORAL | Status: DC
  Administered 2014-12-14 – 2014-12-17 (×4): 100 mg via ORAL
  Filled 2014-12-14 (×5): qty 1

## 2014-12-14 MED ORDER — METOPROLOL TARTRATE 25 MG PO TABS
25.0000 mg | ORAL_TABLET | Freq: Two times a day (BID) | ORAL | Status: DC
  Administered 2014-12-14 – 2014-12-17 (×6): 25 mg via ORAL
  Filled 2014-12-14 (×9): qty 1

## 2014-12-14 MED ORDER — ESCITALOPRAM OXALATE 10 MG PO TABS
10.0000 mg | ORAL_TABLET | Freq: Every day | ORAL | Status: DC
  Administered 2014-12-14 – 2014-12-17 (×4): 10 mg via ORAL
  Filled 2014-12-14 (×4): qty 1

## 2014-12-14 MED ORDER — PRAMOXINE-MENTHOL 1-1 % EX CREA: 1.0000 "application " | TOPICAL_CREAM | Freq: Two times a day (BID) | CUTANEOUS | Status: DC | PRN

## 2014-12-14 MED ORDER — ISOSORBIDE MONONITRATE ER 30 MG PO TB24
30.0000 mg | ORAL_TABLET | Freq: Every day | ORAL | Status: DC
  Administered 2014-12-14 – 2014-12-17 (×4): 30 mg via ORAL
  Filled 2014-12-14 (×4): qty 1

## 2014-12-14 MED ORDER — SODIUM CHLORIDE 0.9 % IJ SOLN
3.0000 mL | Freq: Two times a day (BID) | INTRAMUSCULAR | Status: DC
  Administered 2014-12-14 – 2014-12-17 (×8): 3 mL via INTRAVENOUS

## 2014-12-14 MED ORDER — FUROSEMIDE 10 MG/ML IJ SOLN
40.0000 mg | Freq: Two times a day (BID) | INTRAMUSCULAR | Status: DC
  Administered 2014-12-14 – 2014-12-15 (×4): 40 mg via INTRAVENOUS
  Filled 2014-12-14 (×7): qty 4

## 2014-12-14 MED ORDER — HYDRALAZINE HCL 25 MG PO TABS
25.0000 mg | ORAL_TABLET | Freq: Two times a day (BID) | ORAL | Status: DC
  Administered 2014-12-14 – 2014-12-17 (×7): 25 mg via ORAL
  Filled 2014-12-14 (×9): qty 1

## 2014-12-14 MED ORDER — ASPIRIN EC 81 MG PO TBEC
81.0000 mg | DELAYED_RELEASE_TABLET | Freq: Every day | ORAL | Status: DC
  Administered 2014-12-14: 81 mg via ORAL
  Filled 2014-12-14 (×2): qty 1

## 2014-12-14 MED ORDER — ALBUTEROL SULFATE (2.5 MG/3ML) 0.083% IN NEBU: 2.5000 mg | INHALATION_SOLUTION | RESPIRATORY_TRACT | Status: DC | PRN

## 2014-12-14 MED ORDER — POTASSIUM CHLORIDE CRYS ER 20 MEQ PO TBCR
40.0000 meq | EXTENDED_RELEASE_TABLET | Freq: Once | ORAL | Status: AC
  Administered 2014-12-14: 40 meq via ORAL
  Filled 2014-12-14: qty 2

## 2014-12-14 MED ORDER — FENOFIBRATE 160 MG PO TABS
160.0000 mg | ORAL_TABLET | Freq: Every day | ORAL | Status: DC
  Administered 2014-12-14 – 2014-12-17 (×4): 160 mg via ORAL
  Filled 2014-12-14 (×4): qty 1

## 2014-12-14 MED ORDER — IPRATROPIUM-ALBUTEROL 0.5-2.5 (3) MG/3ML IN SOLN
3.0000 mL | Freq: Four times a day (QID) | RESPIRATORY_TRACT | Status: DC
  Administered 2014-12-14 – 2014-12-16 (×11): 3 mL via RESPIRATORY_TRACT
  Filled 2014-12-14 (×11): qty 3

## 2014-12-14 MED ORDER — GABAPENTIN 100 MG PO CAPS
200.0000 mg | ORAL_CAPSULE | Freq: Every day | ORAL | Status: DC
  Administered 2014-12-14 – 2014-12-17 (×3): 200 mg via ORAL
  Filled 2014-12-14 (×6): qty 2

## 2014-12-14 MED ORDER — ACETAMINOPHEN 650 MG RE SUPP: 650.0000 mg | Freq: Four times a day (QID) | RECTAL | Status: DC | PRN

## 2014-12-14 MED ORDER — ACETAMINOPHEN 325 MG PO TABS: 650.0000 mg | ORAL_TABLET | Freq: Four times a day (QID) | ORAL | Status: DC | PRN

## 2014-12-14 MED ORDER — ADULT MULTIVITAMIN W/MINERALS CH
1.0000 | ORAL_TABLET | Freq: Every day | ORAL | Status: DC
  Administered 2014-12-14 – 2014-12-17 (×4): 1 via ORAL
  Filled 2014-12-14 (×4): qty 1

## 2014-12-14 MED ORDER — VITAMIN D3 25 MCG (1000 UNIT) PO TABS
1000.0000 [IU] | ORAL_TABLET | Freq: Every day | ORAL | Status: DC
  Administered 2014-12-14 – 2014-12-16 (×3): 1000 [IU] via ORAL
  Filled 2014-12-14 (×4): qty 1

## 2014-12-14 MED ORDER — POLYSACCHARIDE IRON COMPLEX 150 MG PO CAPS
150.0000 mg | ORAL_CAPSULE | Freq: Two times a day (BID) | ORAL | Status: DC
  Administered 2014-12-14 – 2014-12-17 (×7): 150 mg via ORAL
  Filled 2014-12-14 (×9): qty 1

## 2014-12-14 MED ORDER — ATORVASTATIN CALCIUM 20 MG PO TABS
20.0000 mg | ORAL_TABLET | Freq: Every day | ORAL | Status: DC
  Administered 2014-12-14 – 2014-12-16 (×3): 20 mg via ORAL
  Filled 2014-12-14 (×4): qty 1

## 2014-12-14 MED ORDER — PANTOPRAZOLE SODIUM 40 MG PO TBEC
40.0000 mg | DELAYED_RELEASE_TABLET | Freq: Every day | ORAL | Status: DC
  Administered 2014-12-14 – 2014-12-17 (×4): 40 mg via ORAL
  Filled 2014-12-14 (×3): qty 1

## 2014-12-14 MED ORDER — IPRATROPIUM-ALBUTEROL 0.5-2.5 (3) MG/3ML IN SOLN: 3.0000 mL | Freq: Four times a day (QID) | RESPIRATORY_TRACT | Status: DC

## 2014-12-14 MED ORDER — INSULIN GLARGINE 100 UNIT/ML ~~LOC~~ SOLN
10.0000 [IU] | Freq: Every day | SUBCUTANEOUS | Status: DC
  Administered 2014-12-14 – 2014-12-16 (×4): 10 [IU] via SUBCUTANEOUS
  Filled 2014-12-14 (×5): qty 0.1

## 2014-12-14 NOTE — Progress Notes (Signed)
ANTICOAGULATION CONSULT NOTE - Initial Consult  Pharmacy Consult for heparin Indication: atrial fibrillation  Allergies  Allergen Reactions  . Other     novocaine broke mouth out  . Codeine Hives  . Penicillins Hives  . Procaine Hives  . Yellow Dyes (Non-Tartrazine) Nausea And Vomiting    "deathly allergic" No other information given to explain reaction.    Patient Measurements: Height: 5\' 2"  (157.5 cm) Weight: 156 lb 3.2 oz (70.852 kg) IBW/kg (Calculated) : 50.1 Heparin Dosing Weight: 65kg  Vital Signs: Temp: 100 F (37.8 C) (07/19 2253) Temp Source: Rectal (07/19 2253) BP: 139/47 mmHg (07/20 0200) Pulse Rate: 77 (07/20 0200)  Labs:  Recent Labs  12/12/14 1045 12/13/14 1934 12/13/14 2016  HGB  --  9.1* 10.2*  HCT  --  30.1* 30.0*  PLT  --  339  --   LABPROT  --  26.2*  --   INR 2.2 2.44*  --   CREATININE  --  2.54* 2.40*    Estimated Creatinine Clearance: 17.2 mL/min (by C-G formula based on Cr of 2.4).   Medical History: Past Medical History  Diagnosis Date  . Hypertension   . Neuropathy   . Anxiety   . PONV (postoperative nausea and vomiting)   . CHF (congestive heart failure)   . COPD (chronic obstructive pulmonary disease)     on home, nocturnal oxygen.   . Family history of adverse reaction to anesthesia   . Cardiomyopathy   . Diabetes mellitus without complication     TYPE 2  . Macular degeneration, bilateral     Medications:  Prescriptions prior to admission  Medication Sig Dispense Refill Last Dose  . acetaminophen (TYLENOL) 325 MG tablet Take 650 mg by mouth every 6 (six) hours as needed for mild pain.    12/13/2014 at Unknown time  . albuterol (PROVENTIL HFA;VENTOLIN HFA) 108 (90 BASE) MCG/ACT inhaler Inhale 2 puffs into the lungs every 6 (six) hours as needed for wheezing or shortness of breath. 1 Inhaler 3 12/13/2014 at Unknown time  . amLODipine (NORVASC) 10 MG tablet Take 10 mg by mouth daily.  4 12/13/2014 at Unknown time  . aspirin  EC 81 MG tablet Take 81 mg by mouth at bedtime.   12/13/2014 at Unknown time  . atorvastatin (LIPITOR) 20 MG tablet Take 20 mg by mouth at bedtime.   12/12/2014 at Unknown time  . cholecalciferol (VITAMIN D) 1000 UNITS tablet Take 1,000 Units by mouth at bedtime.    12/12/2014 at Unknown time  . escitalopram (LEXAPRO) 10 MG tablet Take 10 mg by mouth daily.   12/13/2014 at Unknown time  . fenofibrate 160 MG tablet Take 160 mg by mouth daily.   12/13/2014 at Unknown time  . furosemide (LASIX) 40 MG tablet Take 1 tablet (40 mg total) by mouth 2 (two) times daily. 60 tablet 0 12/13/2014 at Unknown time  . gabapentin (NEURONTIN) 100 MG capsule Take 100-200 mg by mouth 2 (two) times daily. Take 1 capsule (100 mg) morning, take 2 capsules (200 mg) at bedtime   12/13/2014 at Unknown time  . glipiZIDE (GLUCOTROL XL) 10 MG 24 hr tablet Take 10 mg by mouth daily.  11 12/13/2014 at Unknown time  . hydrALAZINE (APRESOLINE) 25 MG tablet Take 25 mg by mouth 2 (two) times daily.  3 12/13/2014 at Unknown time  . iron polysaccharides (NIFEREX) 150 MG capsule Take 1 capsule (150 mg total) by mouth 2 (two) times daily. 60 capsule 0 12/13/2014 at  Unknown time  . isosorbide mononitrate (IMDUR) 30 MG 24 hr tablet Take 30 mg by mouth daily.  3 12/13/2014 at Unknown time  . metoprolol tartrate (LOPRESSOR) 25 MG tablet Take 1 tablet (25 mg total) by mouth 2 (two) times daily. 60 tablet 0 12/13/2014 at Unknown time  . Multiple Vitamin (MULTIVITAMIN WITH MINERALS) TABS tablet Take 1 tablet by mouth daily.   12/13/2014 at Unknown time  . pantoprazole (PROTONIX) 40 MG tablet Take 1 tablet (40 mg total) by mouth daily. 30 tablet 0 12/13/2014 at Unknown time  . warfarin (COUMADIN) 5 MG tablet Take 1 tablet (5 mg total) by mouth daily. (Patient taking differently: Take 2.5-5 mg by mouth daily. Take 1 tablet by mouth one day then take half of the  the other) 90 tablet 3 12/12/2014 at Unknown time  . OXYGEN Inhale into the lungs.   Taking  .  Pramoxine-Menthol (GOLD BOND MAXIMUM RELIEF) 1-1 % CREA Apply 1 application topically 2 (two) times daily as needed (for would care).   Taking   Scheduled:  . aspirin EC  81 mg Oral QHS  . atorvastatin  20 mg Oral QHS  . cholecalciferol  1,000 Units Oral QHS  . escitalopram  10 mg Oral Daily  . fenofibrate  160 mg Oral Daily  . furosemide  40 mg Intravenous BID  . gabapentin  100-200 mg Oral BID  . hydrALAZINE  25 mg Oral BID  . insulin aspart  0-9 Units Subcutaneous TID WC  . insulin glargine  10 Units Subcutaneous QHS  . ipratropium-albuterol  3 mL Nebulization Q6H  . iron polysaccharides  150 mg Oral BID  . isosorbide mononitrate  30 mg Oral Daily  . metoprolol tartrate  25 mg Oral BID  . multivitamin with minerals  1 tablet Oral Daily  . pantoprazole  40 mg Oral Daily  . potassium chloride  20 mEq Oral Once  . sodium chloride  3 mL Intravenous Q12H    Assessment: 81yo female c/o SOB and chest tightness, had recently been dx'd w/ acute HF and Afib and started on Coumadin, plan for possible cardioversion, to hold Coumadin and start heparin bridge when INR <2; current INR 2.44 and 2.2 2d ago, last dose of Coumadin taken 7/18.  Goal of Therapy:  Heparin level 0.3-0.7 units/ml Monitor platelets by anticoagulation protocol: Yes   Plan:  Will start heparin when INR<2.  Vernard Gambles, PharmD, BCPS  12/14/2014,2:44 AM

## 2014-12-14 NOTE — Progress Notes (Signed)
Briefly seen today - has diuresed about 1.6L so far.  Very frustrating situation. I just saw her in the office and she has been re-admitted a total of 8x for CHF over the past 2 years. Her home diuretic regimen does not seem to be working. Continue IV diuretics for now - consider more bioavailable diuretic such as torsemide +/- zaroxolyn after discharge.   Chrystie Nose, MD, [facility] Attending Cardiologist [facility]

## 2014-12-14 NOTE — Consult Note (Signed)
CARDIOLOGY INPATIENT CONSULTATION NOTE  Patient ID: Alexis [name] MRN: [id number], DOB/AGE: 1931/11/15   Admit date: 12/13/2014   Primary Physician: Elizabeth Palau, FNP Primary Cardiologist: Dr. [name]  Reason for Consult:   Shortness of breath  Requesting Physician: Dr. Rosalia Hammers   HPI: This is a 81 y.o. female with no known history of CAD and but has history of ?obstructive airway disease (no PFTs in the system, dx 2 years ago) on 2 L/min [place] (since last 2 years), moderate LVH, HTN, DM2, HTN, unknown anemia, persistent atrial fibrillation, GERD who presented with SOB and wheezing to the ED.  Patient was recently admitted with diagnosis of acute diastolic heart failure and was diagnosed with new onset atrial fibrillation (CHADS2vASC = 6) and was started on coumadin. She was discharged on 40 mg BID of lasix and has been doing well. However she continued to sleep in recliner but denied having leg edema. She said that her dry weight is 153 lbs and she has not been compliant with salt intake. She has been drinking about 60 oz of water every day approximately. She has not been weighing her. She lives with her son. Her daughter is in the room with her.  She denied having diagnosed with sleep apnea in the past. She saw Dr. [name] last week and was feeling better at that time.  Today, on admission, her BNP is in 500s which is lower than what she had on her previous admission. She also had a nuclear stress test in the past which was negative for inducible ischemia. In the office last week she weighed 158 lbs even though her weight was 151 at discharge. In the ED, she weighs 159 lbs. Patient also has pressure ulcers and is not very mobile at home. She is getting weaker every day per her. Today in the evening she developed acute SOB and wheezing for which she came to the ED. Symptoms were progressively worsening and were acute in nature. She was found to have pCO of 64 and pulse ox in 70s. She was started  on bipap with improvement in pulse ox and CO2 down to 52. Her PH also resolved. She is being admitted to medicine service and cardiology is being consulted.  She was also given 80 mg IV of lasix in the ED with albuterol neb treatment.   Data review:  CXR showed bilateral edema with small bilateral effusions.  EKG showed atrial fibrillation 89 bpm, non specific ST T wave changes in the lateral leads Echo 7/7 showed preserved LV function >65%, mild LVH, no significant valvular disease, aortic sclerosis. Review of prior echoes demonstrated conflicting IVSd and PWd even though in the notes, its mentioned that patient has hypertrophic cardiomyopathy.     Problem List: Past Medical History  Diagnosis Date  . Hypertension   . Neuropathy   . Anxiety   . PONV (postoperative nausea and vomiting)   . CHF (congestive heart failure)   . COPD (chronic obstructive pulmonary disease)     on home, nocturnal oxygen.   . Family history of adverse reaction to anesthesia   . Cardiomyopathy   . Diabetes mellitus without complication     TYPE 2  . Macular degeneration, bilateral     Past Surgical History  Procedure Laterality Date  . Hip fracture surgery  2004  . Abdominal hysterectomy    . Vein surgery    . Esophagogastroduodenoscopy N/A 12/01/2014    Procedure: ESOPHAGOGASTRODUODENOSCOPY (EGD);  Surgeon: Hilarie Fredrickson, MD;  Location:  [facility];  Service: Endoscopy;  Laterality: N/A;     Allergies:  Allergies  Allergen Reactions  . Other     novocaine broke mouth out  . Codeine Hives  . Penicillins Hives  . Procaine Hives  . Yellow Dyes (Non-Tartrazine) Nausea And Vomiting    "deathly allergic" No other information given to explain reaction.     Home Medications Current Facility-Administered Medications  Medication Dose Route Frequency Provider Last Rate Last Dose  . potassium chloride SA (K-DUR,KLOR-CON) CR tablet 20 mEq  20 mEq Oral Once Margarita Grizzle, MD   Stopped at 12/13/14 2026    Current Outpatient Prescriptions  Medication Sig Dispense Refill  . acetaminophen (TYLENOL) 325 MG tablet Take 650 mg by mouth every 6 (six) hours as needed for mild pain.     Marland Kitchen albuterol (PROVENTIL HFA;VENTOLIN HFA) 108 (90 BASE) MCG/ACT inhaler Inhale 2 puffs into the lungs every 6 (six) hours as needed for wheezing or shortness of breath. 1 Inhaler 3  . amLODipine (NORVASC) 10 MG tablet Take 10 mg by mouth daily.  4  . aspirin EC 81 MG tablet Take 81 mg by mouth at bedtime.    Marland Kitchen atorvastatin (LIPITOR) 20 MG tablet Take 20 mg by mouth at bedtime.    . cholecalciferol (VITAMIN D) 1000 UNITS tablet Take 1,000 Units by mouth at bedtime.     Marland Kitchen escitalopram (LEXAPRO) 10 MG tablet Take 10 mg by mouth daily.    . fenofibrate 160 MG tablet Take 160 mg by mouth daily.    . furosemide (LASIX) 40 MG tablet Take 1 tablet (40 mg total) by mouth 2 (two) times daily. 60 tablet 0  . gabapentin (NEURONTIN) 100 MG capsule Take 100-200 mg by mouth 2 (two) times daily. Take 1 capsule (100 mg) morning, take 2 capsules (200 mg) at bedtime    . glipiZIDE (GLUCOTROL XL) 10 MG 24 hr tablet Take 10 mg by mouth daily.  11  . hydrALAZINE (APRESOLINE) 25 MG tablet Take 25 mg by mouth 2 (two) times daily.  3  . iron polysaccharides (NIFEREX) 150 MG capsule Take 1 capsule (150 mg total) by mouth 2 (two) times daily. 60 capsule 0  . isosorbide mononitrate (IMDUR) 30 MG 24 hr tablet Take 30 mg by mouth daily.  3  . metoprolol tartrate (LOPRESSOR) 25 MG tablet Take 1 tablet (25 mg total) by mouth 2 (two) times daily. 60 tablet 0  . Multiple Vitamin (MULTIVITAMIN WITH MINERALS) TABS tablet Take 1 tablet by mouth daily.    . pantoprazole (PROTONIX) 40 MG tablet Take 1 tablet (40 mg total) by mouth daily. 30 tablet 0  . warfarin (COUMADIN) 5 MG tablet Take 1 tablet (5 mg total) by mouth daily. (Patient taking differently: Take 2.5-5 mg by mouth daily. Take 1 tablet by mouth one day then take half of the 5mg  the other) 90 tablet  3  . OXYGEN Inhale into the lungs.    . Pramoxine-Menthol (GOLD BOND MAXIMUM RELIEF) 1-1 % CREA Apply 1 application topically 2 (two) times daily as needed (for would care).       Family History  Problem Relation Age of Onset  . Colon cancer Father 31    advanced when discovered, no surgery or therapy.  this led to his death     History   Social History  . Marital Status: Widowed    Spouse Name: N/A  . Number of Children: N/A  . Years of Education: N/A  Occupational History  . Not on file.   Social History Main Topics  . Smoking status: Never Smoker   . Smokeless tobacco: Never Used  . Alcohol Use: No  . Drug Use: No  . Sexual Activity: Not on file   Other Topics Concern  . Not on file   Social History Narrative     Review of Systems: General: fatigue increase weight negative for chills, fever, night sweats  Cardiovascular: leg edema, dyspnea but no chest pain  Dermatological: negative for rash Respiratory: cough, wheezing sob Urologic: negative for hematuria Abdominal: dark stools attributed to iron pills, no bleeding negative for nausea, vomiting, diarrhea, bright red blood per rectum, melena, or hematemesis Neurologic: negative for visual changes, syncope, or dizziness Hematology: idiopathic normocytic anemia Psychiatry: non suicidal/homicidal  Musculoskeletal: back pain   Physical Exam: Vitals: BP 131/75 mmHg  Pulse 77  Temp(Src) 100 F (37.8 C) (Rectal)  Resp 33  SpO2 99% General: in mild acute respiratory distress Neck: JVP mildly elevated to 10 cm, neck supple Heart: regular rate and rhythm, S1, S2, systolic grade II/VI aortic sclerosis murmur with S 2 preserved Lungs: using accessory respiratory muscles, bilateral basilar crackles present, no wheezing  GI: non tender, non distended, bowel sounds present Extremities: no edema Neuro: AAO x 3  Psych: normal affect, no anxiety   Labs:   Results for orders placed or performed during the hospital  encounter of 12/13/14 (from the past 24 hour(s))  Basic metabolic panel     Status: Abnormal   Collection Time: 12/13/14  7:34 PM  Result Value Ref Range   Sodium 139 135 - 145 mmol/L   Potassium 3.6 3.5 - 5.1 mmol/L   Chloride 97 (L) 101 - 111 mmol/L   CO2 30 22 - 32 mmol/L   Glucose, Bld 250 (H) 65 - 99 mg/dL   BUN 32 (H) 6 - 20 mg/dL   Creatinine, Ser 1.61 (H) 0.44 - 1.00 mg/dL   Calcium 8.5 (L) 8.9 - 10.3 mg/dL   GFR calc non Af Amer 17 (L) >60 mL/min   GFR calc Af Amer 19 (L) >60 mL/min   Anion gap 12 5 - 15  CBC     Status: Abnormal   Collection Time: 12/13/14  7:34 PM  Result Value Ref Range   WBC 9.5 4.0 - 10.5 K/uL   RBC 3.43 (L) 3.87 - 5.11 MIL/uL   Hemoglobin 9.1 (L) 12.0 - 15.0 g/dL   HCT 09.6 (L) 04.5 - 40.9 %   MCV 87.8 78.0 - 100.0 fL   MCH 26.5 26.0 - 34.0 pg   MCHC 30.2 30.0 - 36.0 g/dL   RDW 81.1 (H) 91.4 - 78.2 %   Platelets 339 150 - 400 K/uL  Protime-INR - (order if Patient is taking Coumadin / Warfarin)     Status: Abnormal   Collection Time: 12/13/14  7:34 PM  Result Value Ref Range   Prothrombin Time 26.2 (H) 11.6 - 15.2 seconds   INR 2.44 (H) 0.00 - 1.49  Brain natriuretic peptide     Status: Abnormal   Collection Time: 12/13/14  7:34 PM  Result Value Ref Range   B Natriuretic Peptide 581.2 (H) 0.0 - 100.0 pg/mL  I-stat troponin, ED     Status: None   Collection Time: 12/13/14  7:39 PM  Result Value Ref Range   Troponin i, poc 0.01 0.00 - 0.08 ng/mL   Comment 3          I-stat chem  8, ED  (not at [facility], [facility])     Status: Abnormal   Collection Time: 12/13/14  8:16 PM  Result Value Ref Range   Sodium 139 135 - 145 mmol/L   Potassium 3.5 3.5 - 5.1 mmol/L   Chloride 96 (L) 101 - 111 mmol/L   BUN 35 (H) 6 - 20 mg/dL   Creatinine, Ser 8.46 (H) 0.44 - 1.00 mg/dL   Glucose, Bld 962 (H) 65 - 99 mg/dL   Calcium, Ion 9.52 (L) 1.13 - 1.30 mmol/L   TCO2 29 0 - 100 mmol/L   Hemoglobin 10.2 (L) 12.0 - 15.0 g/dL   HCT 84.1 (L) 32.4 - 40.1 %  I-Stat  Arterial Blood Gas, ED - (order at [facility] and [facility] only)     Status: Abnormal   Collection Time: 12/13/14  8:27 PM  Result Value Ref Range   pH, Arterial 7.329 (L) 7.350 - 7.450   pCO2 arterial 63.4 (HH) 35.0 - 45.0 mmHg   pO2, Arterial 68.0 (L) 80.0 - 100.0 mmHg   Bicarbonate 33.5 (H) 20.0 - 24.0 mEq/L   TCO2 35 0 - 100 mmol/L   O2 Saturation 92.0 %   Acid-Base Excess 6.0 (H) 0.0 - 2.0 mmol/L   Patient temperature 98.0 F    Collection site RADIAL, ALLEN'S TEST ACCEPTABLE    Drawn by RT    Sample type ARTERIAL    Comment NOTIFIED PHYSICIAN   I-Stat arterial blood gas, ED     Status: Abnormal   Collection Time: 12/13/14 11:10 PM  Result Value Ref Range   pH, Arterial 7.411 7.350 - 7.450   pCO2 arterial 52.7 (H) 35.0 - 45.0 mmHg   pO2, Arterial 91.0 80.0 - 100.0 mmHg   Bicarbonate 33.6 (H) 20.0 - 24.0 mEq/L   TCO2 35 0 - 100 mmol/L   O2 Saturation 97.0 %   Acid-Base Excess 8.0 (H) 0.0 - 2.0 mmol/L   Patient temperature 98.0 F    Collection site RADIAL, ALLEN'S TEST ACCEPTABLE    Drawn by RT    Sample type ARTERIAL      Radiology/Studies: Dg Chest 2 View  11/29/2014   CLINICAL DATA:  Shortness of breath.  EXAM: CHEST  2 VIEW  COMPARISON:  11/03/2014 .  FINDINGS: Cardiomegaly with pulmonary vascular prominence and diffuse interstitial prominence with small right pleural effusion. These findings consistent with congestive heart failure .  IMPRESSION: Congestive heart failure with pulmonary interstitial edema and small right pleural effusion. Pulmonary interstitial edema has progressed from prior study of 11/03/2014.   Electronically Signed   By: Maisie Fus  Register   On: 11/29/2014 14:50   Dg Chest Portable 1 View  12/13/2014   CLINICAL DATA:  Worsening shortness of breath, chest tightness  EXAM: PORTABLE CHEST - 1 VIEW  COMPARISON:  11/29/2014  FINDINGS: Patchy airspace opacities throughout both lungs involving bases more than apices, increased since previous exam. There is blunting of the  right lateral costophrenic angle suggesting small effusion. Mild cardiomegaly stable. Multiple old left rib fractures. No pneumothorax.  IMPRESSION: 1. Progressive bilateral edema/infiltrates with probable small right effusion.   Electronically Signed   By: Corlis Leak M.D.   On: 12/13/2014 20:32    EKG: atrial fibrillation, non specific ST/T wave changes, 89 bpm, normal axis, normal QRS  Echo:  7/7 showed preserved LV function >65%, mild LVH, no significant valvular disease, aortic sclerosis. Review of prior echoes demonstrated conflicting IVSd and PWd even though in the notes, its mentioned that patient  has hypertrophic cardiomyopathy.   Nuclear stress test 05/2013 Lexiscan, negative for ischemia or fixed defects. Normal LVEF and normal wall motion.   Cardiac cath: not performed  Medical decision making:  Discussed care with the patient and daughter Discussed care with the physician  Reviewed labs and imaging personally Reviewed prior records  ASSESSMENT AND PLAN:  This is a 81 y.o. female with *no known history of CAD and but has history of ?obstructive airway disease (no PFTs in the system, dx 2 years ago) on 2 L/min [place] (since last 2 years), moderate LVH, HTN, DM2, HTN, unknown anemia, persistent atrial fibrillation, GERD who presented with SOB and wheezing to the ED. Patient is in mild to moderate diastolic heart failure and possible ?COPD exacerbation in the setting of acute renal failure.    Principal Problem:   Acute on chronic respiratory failure Active Problems:   Hypertension   (HFpEF) heart failure with preserved ejection fraction   Diabetes mellitus due to underlying condition with neurologic manifestation   COPD (chronic obstructive pulmonary disease)   Atrial fibrillation   Renal insufficiency   Acute on chronic heart failure with preserved ejection fraction/diastolic heart failure NYHA class IV, AHA stage C, elevated BNP, CXR showed pulmonary edema and pleural  effusions  Mild left ventricular hypertrophy  Persistent atrial fibrillation, rate controlled but not rhythm controlled, symptomatic with heart failure, CHA2DS2VASc = 6 on anticoagulation with coumadin for easy reversibility in the presence of anemia  Oxygen dependence for chronic hypercapnic respiratory failure  Chronic lung disease of unknown etiology, PFTs not available  Chronic kidney disease stage 3  Poor mobility, deconditioning/sarcopenia  Acute on chronic hypercapnic, hypoxic respiratory failure required bipap, using accessory respiratory muscles, ABG showed hypercapnia and hypoxia  Anemia, normocytic normochromic on iron pills   Negative nuclear stress test within 2 years  Dyslipidemia last lipid panel from 2010  Uncontrolled diabetes mellitus, no hbA1c available  Non compliance with fluids  Non compliance with medications  Cardiorenal syndrome  PLAN:   Check urine sodium, urea, creatinine and calculate feurea, fena  Lasix 40 mg IV BID  Daily standing weights, goal weight 153 lbs  Strict I/Os, low sodium diet  Oxygen prn   Nebs treatment, treat COPD per primary team  Keep SBP <140 mmHg  Check BMP BID  Keep potassium >4, mag >2, calcium >9  Evaluate for causes of anemia  Control DM  Continue hydralazine, imdur home dose  Stop warfarin and start on IV heparin drip when INR <2 for possible cardioversion  Stop amlodipine  Once patient is improved and diuresed well, consider performing TEE and DCCV to achieve rhythm control in this patient because she is fairly symptomatic with afib  Also consider starting beta blocker once patient is more stable  Check hbA1c    Signed, Joellyn Rued, MD MS 12/14/2014, 12:14 AM

## 2014-12-14 NOTE — H&P (Signed)
Date: 12/14/2014               Patient Name:  Alexis [name] MRN: [id number]  DOB: [birth year] Age / Sex: 81 y.o., female   PCP: Elizabeth Palau, FNP         Medical Service: Internal Medicine Teaching Service         Attending Physician: Dr. Margarita Grizzle, MD    First Contact: Dr. Isabella Bowens Pager: [phone]  Second Contact: Dr. Yetta Barre Pager: [phone]       After Hours (After 5p/  First Contact Pager: [phone]  weekends / holidays): Second Contact Pager: [phone]   Chief Complaint: SOB  History of Present Illness: Ms. Fujii is an 81 year old woman with history of dCHF, COPD on 2-3 LN [place] at night and as needed, HTN, DM2, CKD3, and afib, presenting with dyspnea.   She was recently discharged from [facility] on 7/8 for treatment of acute dCHF exacerbation. At that time, she was diuresed to dry weight of 151 lb.  She was also noticed to have new onset afib at that time. Due to unknown source of GI bleeding, she was not started on anticoagulation at that time (CHADSVASc 6).  Outpatient followup with Cardiology opted to start Warfarin given her elevated risk of stroke.  During that outpatient visit (7/13), her weight was noticed to be increased to 158 pounds.  Today, she complains of acute onset dyspnea beginning around 4 pm.  She had just finished eating and was sitting in her recliner when she became acutely short of breath.  She reports feeling completely normal before the acute event.  She denies chest pain, palpitations, or heaves surrounding the episode.  She denies dietary or medication noncompliance.  She reports she is taking her Lasix as prescribed from her last discharge, with 1 pill BID.  However, she is not sure of the pill dosage.  She denies recent fever or sick contacts.   O2 sat in the ED was 70% on presentation.  The patient was started was given albuterol and started on BiPAP, with resolution of symptoms.  She was off BiPAP, satting >92% on 2-4 L [place] at time of  interview.  During the physical exam, the patient complained of mild right parasternal chest pain with deep inhalation, relieved with leaning forward.  She had not noticed this pain before.  Meds: Current Facility-Administered Medications  Medication Dose Route Frequency Provider Last Rate Last Dose  . potassium chloride SA (K-DUR,KLOR-CON) CR tablet 20 mEq  20 mEq Oral Once Margarita Grizzle, MD   Stopped at 12/13/14 2026   Current Outpatient Prescriptions  Medication Sig Dispense Refill  . acetaminophen (TYLENOL) 325 MG tablet Take 650 mg by mouth every 6 (six) hours as needed for mild pain.     Marland Kitchen albuterol (PROVENTIL HFA;VENTOLIN HFA) 108 (90 BASE) MCG/ACT inhaler Inhale 2 puffs into the lungs every 6 (six) hours as needed for wheezing or shortness of breath. 1 Inhaler 3  . amLODipine (NORVASC) 10 MG tablet Take 10 mg by mouth daily.  4  . aspirin EC 81 MG tablet Take 81 mg by mouth at bedtime.    Marland Kitchen atorvastatin (LIPITOR) 20 MG tablet Take 20 mg by mouth at bedtime.    . cholecalciferol (VITAMIN D) 1000 UNITS tablet Take 1,000 Units by mouth at bedtime.     Marland Kitchen escitalopram (LEXAPRO) 10 MG tablet Take 10 mg by mouth daily.    . fenofibrate 160 MG tablet Take 160 mg by mouth  daily.    . furosemide (LASIX) 40 MG tablet Take 1 tablet (40 mg total) by mouth 2 (two) times daily. 60 tablet 0  . gabapentin (NEURONTIN) 100 MG capsule Take 100-200 mg by mouth 2 (two) times daily. Take 1 capsule (100 mg) morning, take 2 capsules (200 mg) at bedtime    . glipiZIDE (GLUCOTROL XL) 10 MG 24 hr tablet Take 10 mg by mouth daily.  11  . hydrALAZINE (APRESOLINE) 25 MG tablet Take 25 mg by mouth 2 (two) times daily.  3  . iron polysaccharides (NIFEREX) 150 MG capsule Take 1 capsule (150 mg total) by mouth 2 (two) times daily. 60 capsule 0  . isosorbide mononitrate (IMDUR) 30 MG 24 hr tablet Take 30 mg by mouth daily.  3  . metoprolol tartrate (LOPRESSOR) 25 MG tablet Take 1 tablet (25 mg total) by mouth 2 (two)  times daily. 60 tablet 0  . Multiple Vitamin (MULTIVITAMIN WITH MINERALS) TABS tablet Take 1 tablet by mouth daily.    . pantoprazole (PROTONIX) 40 MG tablet Take 1 tablet (40 mg total) by mouth daily. 30 tablet 0  . warfarin (COUMADIN) 5 MG tablet Take 1 tablet (5 mg total) by mouth daily. (Patient taking differently: Take 2.5-5 mg by mouth daily. Take 1 tablet by mouth one day then take half of the  the other) 90 tablet 3  . OXYGEN Inhale into the lungs.    . Pramoxine-Menthol (GOLD BOND MAXIMUM RELIEF) 1-1 % CREA Apply 1 application topically 2 (two) times daily as needed (for would care).      Allergies: Allergies as of 12/13/2014 - Review Complete 12/13/2014  Allergen Reaction Noted  . Other  06/09/2013  . Codeine Hives 06/09/2013  . Penicillins Hives 06/09/2013  . Procaine Hives 07/15/2013  . Yellow dyes (non-tartrazine) Nausea And Vomiting 07/21/2013   Past Medical History  Diagnosis Date  . Hypertension   . Neuropathy   . Anxiety   . PONV (postoperative nausea and vomiting)   . CHF (congestive heart failure)   . COPD (chronic obstructive pulmonary disease)     on home, nocturnal oxygen.   . Family history of adverse reaction to anesthesia   . Cardiomyopathy   . Diabetes mellitus without complication     TYPE 2  . Macular degeneration, bilateral    Past Surgical History  Procedure Laterality Date  . Hip fracture surgery  2004  . Abdominal hysterectomy    . Vein surgery    . Esophagogastroduodenoscopy N/A 12/01/2014    Procedure: ESOPHAGOGASTRODUODENOSCOPY (EGD);  Surgeon: Hilarie Fredrickson, MD;  Location: [facility] ENDOSCOPY;  Service: Endoscopy;  Laterality: N/A;   Family History  Problem Relation Age of Onset  . Colon cancer Father 69    advanced when discovered, no surgery or therapy.  this led to his death   History   Social History  . Marital Status: Widowed    Spouse Name: N/A  . Number of Children: N/A  . Years of Education: N/A   Occupational History  . Not  on file.   Social History Main Topics  . Smoking status: Never Smoker   . Smokeless tobacco: Never Used  . Alcohol Use: No  . Drug Use: No  . Sexual Activity: Not on file   Other Topics Concern  . Not on file   Social History Narrative    Review of Systems: Pertinent items are noted in HPI.  Physical Exam: Blood pressure 131/75, pulse 77, temperature 100 F (37.8  C), temperature source Rectal, resp. rate 33, SpO2 99 %. Physical Exam  Constitutional: She is oriented to person, place, and time. She appears well-developed and well-nourished. No distress.  HENT:  Head: Normocephalic and atraumatic.  Eyes: EOM are normal.  Neck: Normal range of motion. No tracheal deviation present.  JVD noticeable at 2 cm above the clavicle with bed at 30 degrees.  Cardiovascular: Normal rate, normal heart sounds and intact distal pulses.   Irregularly irregular rhythm.  Pulmonary/Chest: Effort normal.  Minimal wheezing. Crackles appreciated in all lung fields, L>R.  Abdominal: Soft. Bowel sounds are normal. She exhibits distension. There is no tenderness. There is no rebound and no guarding.  Musculoskeletal:  Trace edema to knee bilaterally  Neurological: She is alert and oriented to person, place, and time.  Skin: Skin is warm and dry. She is not diaphoretic.     Lab results: Basic Metabolic Panel:  Recent Labs  40/98/11 1934 12/13/14 2016  NA 139 139  K 3.6 3.5  CL 97* 96*  CO2 30  --   GLUCOSE 250* 250*  BUN 32* 35*  CREATININE 2.54* 2.40*  CALCIUM 8.5*  --    Liver Function Tests: No results for input(s): AST, ALT, ALKPHOS, BILITOT, PROT, ALBUMIN in the last 72 hours. No results for input(s): LIPASE, AMYLASE in the last 72 hours. No results for input(s): AMMONIA in the last 72 hours. CBC:  Recent Labs  12/13/14 1934 12/13/14 2016  WBC 9.5  --   HGB 9.1* 10.2*  HCT 30.1* 30.0*  MCV 87.8  --   PLT 339  --    Cardiac Enzymes: No results for input(s): CKTOTAL,  CKMB, CKMBINDEX, TROPONINI in the last 72 hours. BNP: No results for input(s): PROBNP in the last 72 hours. D-Dimer: No results for input(s): DDIMER in the last 72 hours. CBG: No results for input(s): GLUCAP in the last 72 hours. Hemoglobin A1C: No results for input(s): HGBA1C in the last 72 hours. Fasting Lipid Panel: No results for input(s): CHOL, HDL, LDLCALC, TRIG, CHOLHDL, LDLDIRECT in the last 72 hours. Thyroid Function Tests: No results for input(s): TSH, T4TOTAL, FREET4, T3FREE, THYROIDAB in the last 72 hours. Anemia Panel: No results for input(s): VITAMINB12, FOLATE, FERRITIN, TIBC, IRON, RETICCTPCT in the last 72 hours. Coagulation:  Recent Labs  12/12/14 1045 12/13/14 1934  LABPROT  --  26.2*  INR 2.2 2.44*   Urine Drug Screen: Drugs of Abuse  No results found for: LABOPIA, COCAINSCRNUR, LABBENZ, AMPHETMU, THCU, LABBARB  Alcohol Level: No results for input(s): ETH in the last 72 hours. Urinalysis: No results for input(s): COLORURINE, LABSPEC, PHURINE, GLUCOSEU, HGBUR, BILIRUBINUR, KETONESUR, PROTEINUR, UROBILINOGEN, NITRITE, LEUKOCYTESUR in the last 72 hours.  Invalid input(s): APPERANCEUR Misc. Labs: BNP: 580 (720 and 466 on previous admissions; no dry weight baseline available) Procalcitonin: negative  Imaging results:  Dg Chest Portable 1 View  12/13/2014   CLINICAL DATA:  Worsening shortness of breath, chest tightness  EXAM: PORTABLE CHEST - 1 VIEW  COMPARISON:  11/29/2014  FINDINGS: Patchy airspace opacities throughout both lungs involving bases more than apices, increased since previous exam. There is blunting of the right lateral costophrenic angle suggesting small effusion. Mild cardiomegaly stable. Multiple old left rib fractures. No pneumothorax.  IMPRESSION: 1. Progressive bilateral edema/infiltrates with probable small right effusion.   Electronically Signed   By: Corlis Leak M.D.   On: 12/13/2014 20:32    Other results: EKG: NS vs afib. Regular rhythm  without obvious p-waves.  Assessment &  Plan by Problem: Principal Problem:   Acute on chronic respiratory failure Active Problems:   Hypertension   (HFpEF) heart failure with preserved ejection fraction   Diabetes mellitus due to underlying condition with neurologic manifestation   COPD (chronic obstructive pulmonary disease)   Atrial fibrillation   Renal insufficiency  Ms. Helmke is an 81 year old woman with history of dCHF, COPD on 2-3 LN [place] at night and as needed, HTN, DM2, CKD3, and afib, presenting with dyspnea.   Acute on Chronic Hypoxic Respiratory Failure: Patient presents with sudden onset acute dyspnea with O2 sats < 70%.  Etiology unclear at this time, but most likely CHF exacerbation.  She has had multiple admissions for dCHF exacerbation, and patient now has increased weight, pleural effusions, and BNP above normal limits.  She has some abdominal distension and her symptoms improved with IV Lasix and BiPAP in the ED.  She could have a contribution from symptomatic afib, possibly provoking the CHF exacerbation.  However, there is concern for PE given recent addition of Warfarin and sudden onset dyspnea, though patient's symptoms reversed with albuterol and BiPAP.  Patient's creatinine elevated to 2.4 (baseline 1.2-1.4), so CTA should be avoided if possible.  No evidence of infection c/f COPD exacerbation.  Her chest pain relieved with leaning forward is c/f pericardial effusion or pericarditis.  However, no ECG changes or effusion present on CXR.  Consider repeat echo if symptom persists following diuresis.  Per Cardiology consult, will hold Warfarin and bridge with Heparin for possible cardioversion. - Troponin recheck - Lasix 40 IV BID - I/Os, daily weights - [place] - ASA - Metoprolol tartrate 25 mg BID - Hydralazine 25 mg BID - Imdur 24hr 30 mg daily - Atorva 20 - Hold Warfarin - Heparin gtt - Hold Amlodipine per cardiology  dCHF: EF 55-60-% in July 2016.  Cardiology  following. - Lasix 40 IV BID - ASA - Metoprolol tartrate 25 mg BID - Hydralazine 25 mg BID - Imdur 24hr 30 mg daily - Atorva 20 - I/Os, daily weights - [place]  Atrial Fibrillation: CHADS-VASc 6. Cardiology considering possible cardioversion. - Hold Warfarin - Heparin gtt - Hold Amlodipine per cardiology  AKI on CKD3:  Creatinine 2.4, with baseline of 1.2-1.4.  Discharge creatinine 1.6 on 7/8. Likely pre-renal given CHF exacerbation.  Will check FeUrea and CTM - FeUrea  COPD: on 2-3L [place] at home.  - Duonebs/Albuterol  DMII with peripheral neuropathy: A1c 7.7% on 7/6.   - Hold home Glipizide 10 mg daily. - Lantus 10 units qHS - SSI - Gabapentin 100 mg BID  HTN: stable - Metoprolol tartrate 25 mg BID - Hydralazine 25 mg BID - Imdur 24hr 30 mg daily - Hold Amlodipine 10 mg daily  Anemia: improved. Hgb 9.1 from 7.6 on discharge 7/8. CTM  FEN/GI - Protonix - Carb/HH diet  DVT Ppx: Heparin gtt  Dispo: Disposition is deferred at this time, awaiting improvement of current medical problems.   The patient does have a current PCP Elizabeth Palau, FNP) and does need an [facility] hospital follow-up appointment after discharge.  The patient does not have transportation limitations that hinder transportation to clinic appointments.  Signed: Jana Half, MD, PhD 12/14/2014, 12:53 AM

## 2014-12-14 NOTE — ED Notes (Signed)
Patient on % liter via [place]

## 2014-12-14 NOTE — ED Notes (Signed)
Patient sitting up in bed stating "I feel a lot better"

## 2014-12-14 NOTE — Progress Notes (Signed)
Subjective: No acute events overnight. She reports her breathing has improved. Denies chest pain or palpitations.  Objective: Vital signs in last 24 hours: Filed Vitals:   12/14/14 0238 12/14/14 0945 12/14/14 1027 12/14/14 1316  BP: 141/53  122/42 110/55  Pulse: 72  58 66  Temp: 98.5 F (36.9 C)  97.5 F (36.4 C) 97.6 F (36.4 C)  TempSrc: Oral  Oral Oral  Resp: Height:  (1.575 m)     Weight: 156 lb 3.2 oz (70.852 kg)     SpO2: 96% 96% 97% 98%   Weight change:   Intake/Output Summary (Last 24 hours) at 12/14/14 1319 Last data filed at 12/14/14 1316  Gross per 24 hour  Intake    683 ml  Output   2382 ml  Net  -1699 ml   General Apperance: NAD HEENT: Normocephalic, atraumatic, PERRL, EOMI, anicteric sclera Neck: Supple, trachea midline Lungs: Bibasilar rales. No wheezes or rhonchi. Breathing comfortably Heart: Regular rate and rhythm, no murmur/rub/gallop Abdomen: Soft, nontender, nondistended, no rebound/guarding Extremities: Normal, atraumatic, warm and well perfused, 1+ BLE edema Pulses: 2+ throughout Skin: No rashes or lesions Neurologic: Alert and oriented x 3. CNII-XII intact. Normal strength and sensation  Lab Results: Basic Metabolic Panel:  Recent Labs Lab 12/13/14 1934 12/13/14 2016 12/14/14 0325  NA 139 139 140  K 3.6 3.5 3.2*  CL 97* 96* 95*  CO2 30  --  35*  GLUCOSE 250* 250* 242*  BUN 32* 35* 30*  CREATININE 2.54* 2.40* 2.34*  CALCIUM 8.5*  --  8.4*  MG  --   --  2.1   Liver Function Tests:  Recent Labs Lab 12/14/14 0325  AST 16  ALT 12*  ALKPHOS 60  BILITOT 0.9  PROT 5.8*  ALBUMIN 2.9*   CBC:  Recent Labs Lab 12/13/14 1934 12/13/14 2016 12/14/14 0325  WBC 9.5  --  8.6  HGB 9.1* 10.2* 8.3*  HCT 30.1* 30.0* 27.5*  MCV 87.8  --  87.6  PLT 339  --  304   Cardiac Enzymes:  Recent Labs Lab 12/14/14 0325  TROPONINI 0.03    BNP:    Component Value Date/Time   BNP 581.2* 12/13/2014 1934    CBG:  Recent Labs Lab 12/14/14 0218 12/14/14 0600 12/14/14 1148  GLUCAP 222* 197* 291*   Hemoglobin A1C: No results for input(s): HGBA1C in the last 168 hours.   Fasting Lipid Panel:  Recent Labs Lab 12/14/14 0325  CHOL 126  HDL 40*  LDLCALC 72  TRIG 71  CHOLHDL 3.2   Coagulation:  Recent Labs Lab 12/12/14 1045 12/13/14 1934 12/14/14 0325  LABPROT  --  26.2* 29.5*  INR 2.2 2.44* 2.86*   Studies/Results: Dg Chest Portable 1 View  12/13/2014   CLINICAL DATA:  Worsening shortness of breath, chest tightness  EXAM: PORTABLE CHEST - 1 VIEW  COMPARISON:  11/29/2014  FINDINGS: Patchy airspace opacities throughout both lungs involving bases more than apices, increased since previous exam. There is blunting of the right lateral costophrenic angle suggesting small effusion. Mild cardiomegaly stable. Multiple old left rib fractures. No pneumothorax.  IMPRESSION: 1. Progressive bilateral edema/infiltrates with probable small right effusion.   Electronically Signed   By: Corlis Leak M.D.   On: 12/13/2014 20:32   Medications: I have reviewed the patient's current medications. Scheduled Meds: . aspirin EC  81 mg Oral QHS  . atorvastatin  20 mg Oral QHS  . cholecalciferol  1,000 Units  Oral QHS  . escitalopram  10 mg Oral Daily  . fenofibrate  160 mg Oral Daily  . furosemide  40 mg Intravenous BID  . gabapentin  100 mg Oral Daily  . gabapentin  200 mg Oral QHS  . hydrALAZINE  25 mg Oral BID  . insulin aspart  0-9 Units Subcutaneous TID WC  . insulin glargine  10 Units Subcutaneous QHS  . ipratropium-albuterol  3 mL Nebulization Q6H  . iron polysaccharides  150 mg Oral BID  . isosorbide mononitrate  30 mg Oral Daily  . metoprolol tartrate  25 mg Oral BID  . multivitamin with minerals  1 tablet Oral Daily  . pantoprazole  40 mg Oral Daily  . sodium chloride  3 mL Intravenous Q12H   Continuous Infusions:  PRN Meds:.acetaminophen **OR** acetaminophen,  albuterol Assessment/Plan: Alexis [name] is an 81 year old woman with history of dCHF, COPD on 2-3 L [place] at night and as needed, HTN, DM2, CKD3, and afib, presenting with dyspnea.   Acute on chronic hypoxic respiratory failure in setting of acute exacerbation of diastolic CHF: Elevated BNP and CXR with pulmonary edema and pleural effusions. Troponin 0.03 this morning.  - Cardiology following, appreciate recommendations - Lasix 40 IV BID - I/Os, daily weights - Tele - ASA 81mg  daily  - Metoprolol tartrate 25 mg BID - Hydralazine 25 mg BID - Imdur 24hr 30 mg daily - Atorva 20  Atrial Fibrillation: CHADS-VASc 6.  - Hold Warfarin - Heparin gtt  AKI on CKD3: Creatinine 2.4, with baseline of 1.2-1.4. Discharge creatinine 1.6 on 7/8. Likely pre-renal given CHF exacerbation.  -Continue to monitor  COPD: on 2-3L [place] at home.  - Duonebs/Albuterol  DMII with peripheral neuropathy: A1c 7.7% on 7/6.  - Hold home Glipizide 10 mg daily. - Lantus 10 units qHS - SSI - Gabapentin 100 mg BID  HTN: stable - Metoprolol tartrate 25 mg BID - Hydralazine 25 mg BID - Imdur 24hr 30 mg daily - Hold Amlodipine 10 mg daily  Anemia: improved. Hgb 9.1 from 7.6 on discharge 7/8. CTM  FEN/GI - Protonix - Carb/HH diet  VTE Ppx: Heparin gtt  Dispo: Disposition is deferred at this time, awaiting improvement of current medical problems.  Anticipated discharge in approximately 1-2 day(s).   The patient does have a current PCP Alexis Palau, FNP) and does not need an [facility] hospital follow-up appointment after discharge.  The patient does not have transportation limitations that hinder transportation to clinic appointments.  .Services Needed at time of discharge: Y = Yes, Blank = No PT:   OT:   RN:   Equipment:   Other:     LOS: 0 days   Lora Paula, MD 12/14/2014, 1:19 PM

## 2014-12-14 NOTE — Progress Notes (Signed)
Removed patient from Bipap placed on 3.5 lpm. Tolerating well. Last ABG showed improvement in C02 level. Will continue to monitor.

## 2014-12-14 NOTE — Plan of Care (Signed)
Problem: Problem: Cardiovascular Progression Goal: ANTICOAGULATION THERAPY Outcome: Not Met (add Reason) Patient was on Heparin drip but now has  Been disontinued secondary to dark tarry stools

## 2014-12-14 NOTE — Procedures (Signed)
Pt refused to wear BiPAP because she states that "I have to get up and pee too much".  Pt in resting well on 3L [place]. RT will continue to monitor pt and placed if needed.

## 2014-12-15 DIAGNOSIS — E0840 Diabetes mellitus due to underlying condition with diabetic neuropathy, unspecified: Secondary | ICD-10-CM

## 2014-12-15 DIAGNOSIS — E0590 Thyrotoxicosis, unspecified without thyrotoxic crisis or storm: Secondary | ICD-10-CM

## 2014-12-15 DIAGNOSIS — I10 Essential (primary) hypertension: Secondary | ICD-10-CM

## 2014-12-15 DIAGNOSIS — I480 Paroxysmal atrial fibrillation: Secondary | ICD-10-CM

## 2014-12-15 DIAGNOSIS — J9620 Acute and chronic respiratory failure, unspecified whether with hypoxia or hypercapnia: Secondary | ICD-10-CM

## 2014-12-15 LAB — BASIC METABOLIC PANEL
Anion gap: 8 (ref 5–15)
BUN: 29 mg/dL — ABNORMAL HIGH (ref 6–20)
CALCIUM: 9 mg/dL (ref 8.9–10.3)
CHLORIDE: 99 mmol/L — AB (ref 101–111)
CO2: 36 mmol/L — AB (ref 22–32)
Creatinine, Ser: 2.03 mg/dL — ABNORMAL HIGH (ref 0.44–1.00)
GFR, EST AFRICAN AMERICAN: 25 mL/min — AB (ref >60)
GFR, EST NON AFRICAN AMERICAN: 22 mL/min — AB (ref >60)
Glucose, Bld: 137 mg/dL — ABNORMAL HIGH (ref 65–99)
POTASSIUM: 3.4 mmol/L — AB (ref 3.5–5.1)
Sodium: 143 mmol/L (ref 135–145)

## 2014-12-15 LAB — GLUCOSE, CAPILLARY
Glucose-Capillary: 118 mg/dL — ABNORMAL HIGH (ref 65–99)
Glucose-Capillary: 121 mg/dL — ABNORMAL HIGH (ref 65–99)
Glucose-Capillary: 294 mg/dL — ABNORMAL HIGH (ref 65–99)
Glucose-Capillary: 247 mg/dL — ABNORMAL HIGH (ref 65–99)

## 2014-12-15 LAB — CBC
HCT: 27.8 % — ABNORMAL LOW (ref 36.0–46.0)
HEMOGLOBIN: 8.5 g/dL — AB (ref 12.0–15.0)
MCH: 26.7 pg (ref 26.0–34.0)
MCHC: 30.6 g/dL (ref 30.0–36.0)
MCV: 87.4 fL (ref 78.0–100.0)
Platelets: 319 10*3/uL (ref 150–400)
RBC: 3.18 MIL/uL — AB (ref 3.87–5.11)
RDW: 17.9 % — ABNORMAL HIGH (ref 11.5–15.5)
WBC: 7.9 10*3/uL (ref 4.0–10.5)

## 2014-12-15 LAB — PROTIME-INR
INR: 2.7 — ABNORMAL HIGH (ref 0.00–1.49)
Prothrombin Time: 28.3 seconds — ABNORMAL HIGH (ref 11.6–15.2)

## 2014-12-15 LAB — TSH: TSH: 0.261 u[IU]/mL — ABNORMAL LOW (ref 0.350–4.500)

## 2014-12-15 LAB — MAGNESIUM: MAGNESIUM: 2.1 mg/dL (ref 1.7–2.4)

## 2014-12-15 LAB — T4, FREE: Free T4: 1.34 ng/dL — ABNORMAL HIGH (ref 0.61–1.12)

## 2014-12-15 LAB — PHOSPHORUS: PHOSPHORUS: 2.7 mg/dL (ref 2.5–4.6)

## 2014-12-15 MED ORDER — POTASSIUM CHLORIDE CRYS ER 20 MEQ PO TBCR
30.0000 meq | EXTENDED_RELEASE_TABLET | Freq: Once | ORAL | Status: AC
  Administered 2014-12-15: 30 meq via ORAL
  Filled 2014-12-15: qty 1

## 2014-12-15 MED ORDER — WARFARIN SODIUM 2 MG PO TABS
2.0000 mg | ORAL_TABLET | Freq: Once | ORAL | Status: AC
  Administered 2014-12-15: 2 mg via ORAL
  Filled 2014-12-15: qty 1

## 2014-12-15 MED ORDER — WARFARIN - PHARMACIST DOSING INPATIENT
Freq: Every day | Status: DC
  Administered 2014-12-15 – 2014-12-16 (×2)

## 2014-12-15 MED ORDER — POTASSIUM CHLORIDE CRYS ER 20 MEQ PO TBCR: 20.0000 meq | EXTENDED_RELEASE_TABLET | Freq: Every day | ORAL | Status: DC

## 2014-12-15 MED ORDER — POTASSIUM CHLORIDE CRYS ER 20 MEQ PO TBCR
20.0000 meq | EXTENDED_RELEASE_TABLET | Freq: Two times a day (BID) | ORAL | Status: DC
  Administered 2014-12-15 – 2014-12-17 (×4): 20 meq via ORAL
  Filled 2014-12-15 (×5): qty 1

## 2014-12-15 NOTE — Progress Notes (Signed)
Heart Failure Navigator Consult Note  Presentation: Alexis [name] is a 81 y/o female with PMH of chronic diastolic CHF, COPD, HTN, DM, CKD stage 3, afib who p/w acute onset SOB * 1 day. Patient was recently discharged from [facility] on 7/8 for acute dCHF exacerbation. She has followed up with her cardiologist after discharge and appeared to be doing well till yesterday when she developed acute onset dyspnea while sitting. No CP, no palpitations, no diaphoresis, no lightheadedness, no syncope. She is compliant with her meds and diet. In ED she was noted to be in acute CHF exacerbation and was initially started on BIPAP. She feels much better today after diuresing 1.6 litres yesterday.  Past Medical History  Diagnosis Date  . Hypertension   . Neuropathy   . Anxiety   . PONV (postoperative nausea and vomiting)   . CHF (congestive heart failure)   . COPD (chronic obstructive pulmonary disease)     on home, nocturnal oxygen.   . Family history of adverse reaction to anesthesia   . Cardiomyopathy   . Diabetes mellitus without complication     TYPE 2  . Macular degeneration, bilateral     History   Social History  . Marital Status: Widowed    Spouse Name: N/A  . Number of Children: N/A  . Years of Education: N/A   Social History Main Topics  . Smoking status: Never Smoker   . Smokeless tobacco: Never Used  . Alcohol Use: No  . Drug Use: No  . Sexual Activity: Not on file   Other Topics Concern  . None   Social History Narrative    ECHO:Study Conclusions-12/01/14  - Left ventricle: The cavity size was normal. Systolic function was normal. The estimated ejection fraction was in the range of 55% to 60%. Wall motion was normal; there were no regional wall motion abnormalities. - Aortic valve: Valve area (VTI): 1.78 cm^2. Valve area (Vmax): 1.82 cm^2. Valve area (Vmean): 1.64 cm^2. - Mitral valve: There was mild regurgitation. Valve area by continuity equation  (using LVOT flow): 1.79 cm^2. - Left atrium: The atrium was mildly dilated.  Transthoracic echocardiography. M-mode, complete 2D, spectral Doppler, and color Doppler. Birthdate: Patient birthdate: September 16, 1931. Age: Patient is 81 yr old. Sex: Gender: female. BMI: 30 kg/m^2. Blood pressure:   124/42 Patient status: Inpatient. Study date: Study date: 12/01/2014. Study time: 03:46 PM. Location: Echo laboratory.  BNP    Component Value Date/Time   BNP 581.2* 12/13/2014 1934    ProBNP    Component Value Date/Time   PROBNP 2482.0* 05/12/2014 0350     Education Assessment and Provision:  Detailed education and instructions provided on heart failure disease management including the following:  Signs and symptoms of Heart Failure When to call the physician Importance of daily weights Low sodium diet Fluid restriction Medication management Anticipated future follow-up appointments  Patient education given on each of the above topics.  Patient acknowledges understanding and acceptance of all instructions.  I spoke with Alexis [name] regarding her HF.  She tells me that she has a scale at home, weighs daily and denies any recent weight gains.  Of note her recent weights as noted in chart are 12/02/14-154.9lbs, 12/07/14-158.7lbs and 12/14/14-155.8 lbs.  She denies any large increase in Sodium intake and tells me that her son does most of the cooking.  We reviewed briefly a low sodium diet and high sodium foods to avoid.  She also denies any issue with getting or taking prescribed  medications.  She follows with Dr. [name] as an outpatient.  I have encouraged her to call me back if she has any issues or questions regarding her HF after discharge.  Education Materials:  "Living Better With Heart Failure" Booklet, Daily Weight Tracker Tool  High Risk Criteria for Readmission and/or Poor Patient Outcomes:   EF <30%- No 55-60% -diastolic HF  2 or more admissions in 6 months-  Yes  Difficult social situation- No-lives with son  Demonstrates medication noncompliance- No--denies     Barriers of Care:  Knowledge and compliance  Discharge Planning:  Plans to discharge to home with her son in [place].  She may benefit from [facility] for ongoing symptom recognition, compliance and education reinforcement.

## 2014-12-15 NOTE — Progress Notes (Addendum)
Patient seen and examined. Case d/w residents in detail. I agree with findings and plan as documented in Dr. Joaquin Music note.  Patient feels better today. Decreased SOB but still with bibasilar crackles on exam. Will c/w IV diuresis today and possibly transition to PO torsemide in the AM. Weight has improved to 155 lbs and creatinine is improving with diuresis. Cardiology recommendations appreciated. Will f/u repeat TSH and free T4. If patient is hyperthyroid it may be contributing to afib and will need to be treated. Patient may eventually need TEE/cardioversion after 4 weeks of therapeutic a/c.   C/w coumadin. INR is therapeutic. Will d/w cardio as to whether patient will need to be on both coumadin as well as asa given recent GI bleed. If possible will d/c asa and continue only with coumadin.

## 2014-12-15 NOTE — Progress Notes (Signed)
I spoke with pharmacist. Not clear why Heparin was started "for possible cardioversion" on admission since pt is on Coumadin and has been therapeutic. Will cancel Heparin order, continue Coumadin.   Corine Shelter PA-C 12/15/2014 11:34 AM

## 2014-12-15 NOTE — Progress Notes (Signed)
ANTICOAGULATION CONSULT NOTE - Initial Consult  Pharmacy Consult for Warfarin Indication: atrial fibrillation  Allergies  Allergen Reactions  . Other     novocaine broke mouth out  . Codeine Hives  . Penicillins Hives  . Procaine Hives  . Yellow Dyes (Non-Tartrazine) Nausea And Vomiting    "deathly allergic" No other information given to explain reaction.    Patient Measurements: Height: 5\' 2"  (157.5 cm) Weight: 155 lb 1.6 oz (70.353 kg) (b scale) IBW/kg (Calculated) : 50.1   Vital Signs: Temp: 98.2 F (36.8 C) (07/21 1030) Temp Source: Oral (07/21 1030) BP: 141/49 mmHg (07/21 1030) Pulse Rate: 75 (07/21 1030)  Labs:  Recent Labs  12/13/14 1934 12/13/14 2016 12/14/14 0325 12/15/14 0419  HGB 9.1* 10.2* 8.3* 8.5*  HCT 30.1* 30.0* 27.5* 27.8*  PLT 339  --  304 319  LABPROT 26.2*  --  29.5* 28.3*  INR 2.44*  --  2.86* 2.70*  CREATININE 2.54* 2.40* 2.34* 2.03*  TROPONINI  --   --  0.03  --     Estimated Creatinine Clearance: 20.3 mL/min (by C-G formula based on Cr of 2.03).   Medical History: Past Medical History  Diagnosis Date  . Hypertension   . Neuropathy   . Anxiety   . PONV (postoperative nausea and vomiting)   . CHF (congestive heart failure)   . COPD (chronic obstructive pulmonary disease)     on home, nocturnal oxygen.   . Family history of adverse reaction to anesthesia   . Cardiomyopathy   . Diabetes mellitus without complication     TYPE 2  . Macular degeneration, bilateral     Medications:  Scheduled:  . aspirin EC  81 mg Oral QHS  . atorvastatin  20 mg Oral QHS  . cholecalciferol  1,000 Units Oral QHS  . escitalopram  10 mg Oral Daily  . fenofibrate  160 mg Oral Daily  . furosemide  40 mg Intravenous BID  . gabapentin  100 mg Oral Daily  . gabapentin  200 mg Oral QHS  . hydrALAZINE  25 mg Oral BID  . insulin aspart  0-9 Units Subcutaneous TID WC  . insulin glargine  10 Units Subcutaneous QHS  . ipratropium-albuterol  3 mL  Nebulization Q6H  . iron polysaccharides  150 mg Oral BID  . isosorbide mononitrate  30 mg Oral Daily  . metoprolol tartrate  25 mg Oral BID  . multivitamin with minerals  1 tablet Oral Daily  . pantoprazole  40 mg Oral Daily  . sodium chloride  3 mL Intravenous Q12H    Assessment: 81yo female c/o SOB and chest tightness, had recently been dx'd w/ acute HF and Afib and started on Coumadin, plan for possible cardioversion.    Heparin has been discontinued and pharmacy has been consulted to resume coumadin.  Patient PTA coumadin dose was Alternate 5mg  one day then 2.5 mg the other day.  Current INR 2.7  NR Trend: Results for PARYS, SABATINI (MRN [id number]) as of 12/15/2014 11:39  Ref. Range 12/12/2014 10:45 12/13/2014 19:34 12/14/2014 03:25 12/15/2014 04:19  INR Latest Ref Range: 0.00-1.49  2.2 2.44 (H) 2.86 (H) 2.70 (H)     Goal of Therapy:  INR 2-3 Monitor platelets by anticoagulation protocol: Yes   Plan:  -Coumadin 2 mg x 1 dose tonight -Daily PT/INR -Follow CBC -Monitor for s/sx bleeding   Javier Mamone P Zanaiya Calabria Pharm.D., BCPS 12/15/2014,11:46 AM

## 2014-12-15 NOTE — Progress Notes (Signed)
Subjective: No acute events overnight. She is doing well overall. Breathing has improved. Denies chest pain or palpitations. Voiding without difficulty.  Objective: Vital signs in last 24 hours: Filed Vitals:   12/14/14 2038 12/14/14 2111 12/15/14 0153 12/15/14 0415  BP:  145/58  132/55  Pulse:  87  66  Temp:  97.8 F (36.6 C)  98 F (36.7 C)  TempSrc:  Oral  Oral  Resp:  17  18  Height:      Weight:    155 lb 1.6 oz (70.353 kg)  SpO2: 91% 94% 95% 98%   Weight change: -1 lb 1.6 oz (-0.499 kg)  Intake/Output Summary (Last 24 hours) at 12/15/14 0851 Last data filed at 12/15/14 0816  Gross per 24 hour  Intake   1220 ml  Output   2875 ml  Net  -1655 ml   General Apperance: NAD HEENT: Normocephalic, atraumatic, PERRL, EOMI, anicteric sclera Neck: Supple, trachea midline Lungs: Bibasilar rales. No wheezes or rhonchi. Breathing comfortably Heart: Regular rate and rhythm, no murmur/rub/gallop Abdomen: Soft, nontender, nondistended, no rebound/guarding Extremities: Normal, atraumatic, warm and well perfused, 1+ BLE edema Pulses: 2+ throughout Skin: No rashes or lesions Neurologic: Alert and oriented x 3. CNII-XII intact. Normal strength and sensation  Lab Results: Basic Metabolic Panel:  Recent Labs Lab 12/14/14 0325 12/15/14 0419  NA 140 143  K 3.2* 3.4*  CL 95* 99*  CO2 35* 36*  GLUCOSE 242* 137*  BUN 30* 29*  CREATININE 2.34* 2.03*  CALCIUM 8.4* 9.0  MG 2.1 2.1  PHOS  --  2.7   Liver Function Tests:  Recent Labs Lab 12/14/14 0325  AST 16  ALT 12*  ALKPHOS 60  BILITOT 0.9  PROT 5.8*  ALBUMIN 2.9*   CBC:  Recent Labs Lab 12/14/14 0325 12/15/14 0419  WBC 8.6 7.9  HGB 8.3* 8.5*  HCT 27.5* 27.8*  MCV 87.6 87.4  PLT 304 319   Cardiac Enzymes:  Recent Labs Lab 12/14/14 0325  TROPONINI 0.03    BNP:    Component Value Date/Time   BNP 581.2* 12/13/2014 1934   CBG:  Recent Labs Lab 12/14/14 0218 12/14/14 0600 12/14/14 1148  12/14/14 1650 12/14/14 2106 12/15/14 0623  GLUCAP 222* 197* 291* 267* 185* 118*   Hemoglobin A1C: No results for input(s): HGBA1C in the last 168 hours.   Fasting Lipid Panel:  Recent Labs Lab 12/14/14 0325  CHOL 126  HDL 40*  LDLCALC 72  TRIG 71  CHOLHDL 3.2   Coagulation:  Recent Labs Lab 12/12/14 1045 12/13/14 1934 12/14/14 0325 12/15/14 0419  LABPROT  --  26.2* 29.5* 28.3*  INR 2.2 2.44* 2.86* 2.70*   Studies/Results: Dg Chest Portable 1 View  12/13/2014   CLINICAL DATA:  Worsening shortness of breath, chest tightness  EXAM: PORTABLE CHEST - 1 VIEW  COMPARISON:  11/29/2014  FINDINGS: Patchy airspace opacities throughout both lungs involving bases more than apices, increased since previous exam. There is blunting of the right lateral costophrenic angle suggesting small effusion. Mild cardiomegaly stable. Multiple old left rib fractures. No pneumothorax.  IMPRESSION: 1. Progressive bilateral edema/infiltrates with probable small right effusion.   Electronically Signed   By: Corlis Leak M.D.   On: 12/13/2014 20:32   Medications: I have reviewed the patient's current medications. Scheduled Meds: . aspirin EC  81 mg Oral QHS  . atorvastatin  20 mg Oral QHS  . cholecalciferol  1,000 Units Oral QHS  . escitalopram  10 mg Oral Daily  .  fenofibrate  160 mg Oral Daily  . furosemide  40 mg Intravenous BID  . gabapentin  100 mg Oral Daily  . gabapentin  200 mg Oral QHS  . hydrALAZINE  25 mg Oral BID  . insulin aspart  0-9 Units Subcutaneous TID WC  . insulin glargine  10 Units Subcutaneous QHS  . ipratropium-albuterol  3 mL Nebulization Q6H  . iron polysaccharides  150 mg Oral BID  . isosorbide mononitrate  30 mg Oral Daily  . metoprolol tartrate  25 mg Oral BID  . multivitamin with minerals  1 tablet Oral Daily  . pantoprazole  40 mg Oral Daily  . potassium chloride  30 mEq Oral Once  . sodium chloride  3 mL Intravenous Q12H   Continuous Infusions:  PRN  Meds:.acetaminophen **OR** acetaminophen, albuterol Assessment/Plan: [name] is an 81 year old woman with history of dCHF, COPD on 2-3 L [place] at night and as needed, HTN, DM2, CKD3, and afib, presenting with dyspnea.   Acute on chronic hypoxic respiratory failure in setting of acute exacerbation of diastolic CHF: Elevated BNP and CXR with pulmonary edema and pleural effusions. 2.9L UOP yesterday and net -3.2 L since admission. Weight 155 from 156 at admission. - Cardiology following, appreciate recommendations - Lasix 40 IV BID - I/Os, daily weights - Tele - ASA 81mg  daily  - Metoprolol tartrate 25 mg BID - Hydralazine 25 mg BID - Imdur 24hr 30 mg daily - Atorva 20  Atrial Fibrillation: CHADS-VASc 6.  - Hold Warfarin - Heparin gtt when INR less than 2  AKI on CKD3: Creatinine 2.4, with baseline of 1.2-1.4. Discharge creatinine 1.6 on 7/8. Likely pre-renal given CHF exacerbation. Cr down to 2.03 today. -Continue to monitor  COPD: on 2-3L [place] at home.  - Duonebs/Albuterol  DMII with peripheral neuropathy: A1c 7.7% on 7/6.  - Hold home Glipizide 10 mg daily. - Lantus 10 units qHS - SSI - Gabapentin 100 mg BID  HTN: stable - Metoprolol tartrate 25 mg BID - Hydralazine 25 mg BID - Imdur 24hr 30 mg daily - Hold Amlodipine 10 mg daily  Anemia: improved. Hgb 9.1 from 7.6 on discharge 7/8. CTM  FEN/GI - Protonix - Carb/HH diet  VTE Ppx: Heparin gtt  Dispo: Disposition is deferred at this time, awaiting improvement of current medical problems.  Anticipated discharge in approximately 1-2 day(s).   The patient does have a current PCP Elizabeth Palau, FNP) and does not need an [facility] hospital follow-up appointment after discharge.  The patient does not have transportation limitations that hinder transportation to clinic appointments.  .Services Needed at time of discharge: Y = Yes, Blank = No PT:   OT:   RN:   Equipment:   Other:     LOS: 1 day   Lora Paula, MD 12/15/2014, 8:51 AM

## 2014-12-15 NOTE — Progress Notes (Signed)
Patient: Alexis [name] / Admit Date: 12/13/2014 / Date of Encounter: 12/15/2014, 12:16 PM   Subjective: No CP or SOB. Says she feels pretty good today. We discussed low salt diet and fluid restriction.   Objective: Telemetry: rate controlled atrial fib Physical Exam: Blood pressure 141/49, pulse 75, temperature 98.2 F (36.8 C), temperature source Oral, resp. rate 18, height  (1.575 m), weight 155 lb 1.6 oz (70.353 kg), SpO2 95 %. General: Well developed, well nourished WF, in no acute distress. Head: Normocephalic, atraumatic, sclera non-icteric, no xanthomas, nares are without discharge. Neck: JVP not elevated. Lungs: Faint crackles at bases, no wheezes or rhonchi. Breathing is unlabored. Heart: Irregularly irregular, rate controlled, S1 S2 without murmurs, rubs, or gallops.  Abdomen: Soft, non-tender, non-distended with normoactive bowel sounds. No rebound/guarding. Extremities: No clubbing or cyanosis. No edema. Distal pedal pulses are 2+ and equal bilaterally. Neuro: Alert and oriented X 3. Moves all extremities spontaneously. Psych:  Responds to questions appropriately with a normal affect.   Intake/Output Summary (Last 24 hours) at 12/15/14 1216 Last data filed at 12/15/14 1114  Gross per 24 hour  Intake    840 ml  Output   3025 ml  Net  -2185 ml    Inpatient Medications:  . aspirin EC  81 mg Oral QHS  . atorvastatin  20 mg Oral QHS  . cholecalciferol  1,000 Units Oral QHS  . escitalopram  10 mg Oral Daily  . fenofibrate  160 mg Oral Daily  . furosemide  40 mg Intravenous BID  . gabapentin  100 mg Oral Daily  . gabapentin  200 mg Oral QHS  . hydrALAZINE  25 mg Oral BID  . insulin aspart  0-9 Units Subcutaneous TID WC  . insulin glargine  10 Units Subcutaneous QHS  . ipratropium-albuterol  3 mL Nebulization Q6H  . iron polysaccharides  150 mg Oral BID  . isosorbide mononitrate  30 mg Oral Daily  . metoprolol tartrate  25 mg Oral BID  . multivitamin  with minerals  1 tablet Oral Daily  . pantoprazole  40 mg Oral Daily  . sodium chloride  3 mL Intravenous Q12H   Infusions:    Labs:  Recent Labs  12/14/14 0325 12/15/14 0419  NA 140 143  K 3.2* 3.4*  CL 95* 99*  CO2 35* 36*  GLUCOSE 242* 137*  BUN 30* 29*  CREATININE 2.34* 2.03*  CALCIUM 8.4* 9.0  MG 2.1 2.1  PHOS  --  2.7    Recent Labs  12/14/14 0325  AST 16  ALT 12*  ALKPHOS 60  BILITOT 0.9  PROT 5.8*  ALBUMIN 2.9*    Recent Labs  12/14/14 0325 12/15/14 0419  WBC 8.6 7.9  HGB 8.3* 8.5*  HCT 27.5* 27.8*  MCV 87.6 87.4  PLT 304 319    Recent Labs  12/14/14 0325  TROPONINI 0.03   Invalid input(s): POCBNP No results for input(s): HGBA1C in the last 72 hours.   Radiology/Studies:  Dg Chest 2 View  11/29/2014   CLINICAL DATA:  Shortness of breath.  EXAM: CHEST  2 VIEW  COMPARISON:  11/03/2014 .  FINDINGS: Cardiomegaly with pulmonary vascular prominence and diffuse interstitial prominence with small right pleural effusion. These findings consistent with congestive heart failure .  IMPRESSION: Congestive heart failure with pulmonary interstitial edema and small right pleural effusion. Pulmonary interstitial edema has progressed from prior study of 11/03/2014.   Electronically Signed   By: Maisie Fus  Register   On:  11/29/2014 14:50   Dg Chest Portable 1 View  12/13/2014   CLINICAL DATA:  Worsening shortness of breath, chest tightness  EXAM: PORTABLE CHEST - 1 VIEW  COMPARISON:  11/29/2014  FINDINGS: Patchy airspace opacities throughout both lungs involving bases more than apices, increased since previous exam. There is blunting of the right lateral costophrenic angle suggesting small effusion. Mild cardiomegaly stable. Multiple old left rib fractures. No pneumothorax.  IMPRESSION: 1. Progressive bilateral edema/infiltrates with probable small right effusion.   Electronically Signed   By: Corlis Leak M.D.   On: 12/13/2014 20:32     Assessment and Plan  27F with  CAD, ?obstructive airway dz on 2L [place], mod LVH, HTN, DM, anemia (EGD 11/2014: incidental esophageal diverticulum and stricture but otherwise unremarkable, not felt to be a candidate for more aggressive workup), CKD III, recently diagnosed persistent atrial fib, GERD, OSA< readmitted with CHF.   1. Acute on chronic respiratory failure 2. Acute on chronic diastolic CHF in the setting of salt restriction noncompliance 3. Chronic lung disease of unknown etiology/h/o obstructive airways disease 4. Probable deconditioning 5. CKD stage III-IV (recent baseline unclear, variable from 1.4-1.9) 6. Persistent atrial fibrillation, recently diagnosed 7. Recent abnormal thyroid function with suppressed TSH and low free T4, felt to require outpatient wrokup 8. Hypokalemia 9. Recent severe anemia of unknown cause, see above, generally stable since last admission  Has diuresed from a reported 159lb in ED -> 155lb today with -3.8L thus far. During last admission, d/c weight was 155lb. From the admission prior to that her d/c weight was 151lb. Her creatinine continues to improve with diuresis. Still some crackles on exam. Would continue IV Lasix through today and consider changing to torsemide tomorrow if she continues to feel good. She received of KCl this AM - will give another this evening and start BID tomorrow which may need to be further titrated tomorrow based on adjustment of diuretic. Mobilize with PT.  Not sure she needs to be on both aspirin and Coumadin given her h/o bleeding - will discuss with MD. At this time I do not see any plans for DCCV. Since she is currently rate controlled and asymptomatic in atrial fib, it is reasonable to follow this for now. If she underwent DCCV she would require 4 weeks of uninterrupted anticoagulation - even though she is tolerating Coumadin thus far, this anticoagulant was chosen for its easier reversibility given recent significant anemia. It is reasonable to  follow the trajectory of anemia on uninterrupted anticoagulation for now and consider this as an outpatient. IM note says to hold Coumadin but not clear what for - this has been resumed per pharmacy notes.   Also of note she had evidence of hyperthyroidism last admission which could contribute to recurrent cardiac issues. Will recheck today. If these labs are similar to last admission would involve endocrine opinion for treatment.    Signed, Ronie Spies PA-C Pager: [phone]

## 2014-12-16 DIAGNOSIS — H10022 Other mucopurulent conjunctivitis, left eye: Secondary | ICD-10-CM

## 2014-12-16 DIAGNOSIS — J9621 Acute and chronic respiratory failure with hypoxia: Secondary | ICD-10-CM

## 2014-12-16 LAB — BASIC METABOLIC PANEL
Anion gap: 9 (ref 5–15)
BUN: 30 mg/dL — AB (ref 6–20)
CALCIUM: 8.7 mg/dL — AB (ref 8.9–10.3)
CO2: 34 mmol/L — ABNORMAL HIGH (ref 22–32)
CREATININE: 2.05 mg/dL — AB (ref 0.44–1.00)
Chloride: 98 mmol/L — ABNORMAL LOW (ref 101–111)
GFR calc Af Amer: 25 mL/min — ABNORMAL LOW (ref >60)
GFR calc non Af Amer: 22 mL/min — ABNORMAL LOW (ref >60)
Glucose, Bld: 107 mg/dL — ABNORMAL HIGH (ref 65–99)
Potassium: 3.7 mmol/L (ref 3.5–5.1)
Sodium: 141 mmol/L (ref 135–145)

## 2014-12-16 LAB — GLUCOSE, CAPILLARY
GLUCOSE-CAPILLARY: 90 mg/dL (ref 65–99)
GLUCOSE-CAPILLARY: 122 mg/dL — AB (ref 65–99)
GLUCOSE-CAPILLARY: 169 mg/dL — AB (ref 65–99)
GLUCOSE-CAPILLARY: 252 mg/dL — AB (ref 65–99)

## 2014-12-16 LAB — PROTIME-INR
INR: 2.72 — AB (ref 0.00–1.49)
Prothrombin Time: 28.5 seconds — ABNORMAL HIGH (ref 11.6–15.2)

## 2014-12-16 LAB — CBC
HEMATOCRIT: 25.2 % — AB (ref 36.0–46.0)
Hemoglobin: 7.7 g/dL — ABNORMAL LOW (ref 12.0–15.0)
MCH: 26.9 pg (ref 26.0–34.0)
MCHC: 30.6 g/dL (ref 30.0–36.0)
MCV: 88.1 fL (ref 78.0–100.0)
PLATELETS: 299 10*3/uL (ref 150–400)
RBC: 2.86 MIL/uL — ABNORMAL LOW (ref 3.87–5.11)
RDW: 18.1 % — ABNORMAL HIGH (ref 11.5–15.5)
WBC: 5.8 10*3/uL (ref 4.0–10.5)

## 2014-12-16 LAB — PHOSPHORUS: PHOSPHORUS: 2.6 mg/dL (ref 2.5–4.6)

## 2014-12-16 LAB — MAGNESIUM: Magnesium: 2.1 mg/dL (ref 1.7–2.4)

## 2014-12-16 MED ORDER — TORSEMIDE 20 MG PO TABS
20.0000 mg | ORAL_TABLET | Freq: Two times a day (BID) | ORAL | Status: DC
  Administered 2014-12-16 – 2014-12-17 (×2): 20 mg via ORAL
  Filled 2014-12-16 (×4): qty 1

## 2014-12-16 MED ORDER — METHIMAZOLE 5 MG PO TABS
5.0000 mg | ORAL_TABLET | Freq: Every day | ORAL | Status: DC
  Administered 2014-12-16 – 2014-12-17 (×2): 5 mg via ORAL
  Filled 2014-12-16 (×2): qty 1

## 2014-12-16 MED ORDER — FUROSEMIDE 10 MG/ML IJ SOLN
40.0000 mg | Freq: Two times a day (BID) | INTRAMUSCULAR | Status: DC
  Administered 2014-12-16: 40 mg via INTRAVENOUS

## 2014-12-16 MED ORDER — IPRATROPIUM-ALBUTEROL 0.5-2.5 (3) MG/3ML IN SOLN
3.0000 mL | Freq: Two times a day (BID) | RESPIRATORY_TRACT | Status: DC
  Administered 2014-12-17: 3 mL via RESPIRATORY_TRACT
  Filled 2014-12-16: qty 3

## 2014-12-16 MED ORDER — ERYTHROMYCIN 5 MG/GM OP OINT
TOPICAL_OINTMENT | Freq: Four times a day (QID) | OPHTHALMIC | Status: DC
  Administered 2014-12-16 (×3): 1 via OPHTHALMIC
  Administered 2014-12-17: 07:00:00 via OPHTHALMIC
  Filled 2014-12-16: qty 3.5

## 2014-12-16 NOTE — Progress Notes (Signed)
ANTICOAGULATION CONSULT NOTE   Pharmacy Consult for Warfarin Indication: atrial fibrillation  Allergies  Allergen Reactions  . Other     novocaine broke mouth out  . Codeine Hives  . Penicillins Hives  . Procaine Hives  . Yellow Dyes (Non-Tartrazine) Nausea And Vomiting    "deathly allergic" No other information given to explain reaction.    Patient Measurements: Height: 5\' 2"  (157.5 cm) Weight: 154 lb 6.4 oz (70.035 kg) IBW/kg (Calculated) : 50.1   Vital Signs: Temp: 98.4 F (36.9 C) (07/22 0615) Temp Source: Oral (07/22 0615) BP: 124/50 mmHg (07/22 0944)  Labs:  Recent Labs  12/14/14 0325 12/15/14 0419 12/16/14 0501  HGB 8.3* 8.5* 7.7*  HCT 27.5* 27.8* 25.2*  PLT 304 319 299  LABPROT 29.5* 28.3* 28.5*  INR 2.86* 2.70* 2.72*  CREATININE 2.34* 2.03* 2.05*  TROPONINI 0.03  --   --     Estimated Creatinine Clearance: 20.1 mL/min (by C-G formula based on Cr of 2.05).   Medical History: Past Medical History  Diagnosis Date  . Hypertension   . Neuropathy   . Anxiety   . PONV (postoperative nausea and vomiting)   . CHF (congestive heart failure)   . COPD (chronic obstructive pulmonary disease)     on home, nocturnal oxygen.   . Family history of adverse reaction to anesthesia   . Cardiomyopathy   . Diabetes mellitus without complication     TYPE 2  . Macular degeneration, bilateral     Medications:  Scheduled:  . atorvastatin  20 mg Oral QHS  . cholecalciferol  1,000 Units Oral QHS  . escitalopram  10 mg Oral Daily  . fenofibrate  160 mg Oral Daily  . furosemide  40 mg Intravenous BID  . gabapentin  100 mg Oral Daily  . gabapentin  200 mg Oral QHS  . hydrALAZINE  25 mg Oral BID  . insulin aspart  0-9 Units Subcutaneous TID WC  . insulin glargine  10 Units Subcutaneous QHS  . ipratropium-albuterol  3 mL Nebulization Q6H  . iron polysaccharides  150 mg Oral BID  . isosorbide mononitrate  30 mg Oral Daily  . metoprolol tartrate  25 mg Oral BID  .  multivitamin with minerals  1 tablet Oral Daily  . pantoprazole  40 mg Oral Daily  . potassium chloride  20 mEq Oral BID  . sodium chloride  3 mL Intravenous Q12H  . Warfarin - Pharmacist Dosing Inpatient   Does not apply q1800    Assessment: 81yo female c/o SOB and chest tightness, had recently been dx'd w/ acute HF and Afib and started on Coumadin, plan for possible cardioversion.    Heparin has been discontinued and pharmacy has been consulted to resume coumadin.  Patient PTA coumadin dose was Alternate 5mg  one day then 2.5 mg the other day.  Current INR 2.72  Goal of Therapy:  INR 2-3 Monitor platelets by anticoagulation protocol: Yes   Plan:  -Coumadin 1 mg x 1 dose tonight -Daily PT/INR -Follow CBC -Monitor for s/sx bleeding   Alexis [name] Pharm.D 12/16/2014,10:13 AM

## 2014-12-16 NOTE — Care Management Note (Signed)
Case Management Note  Patient Details  Name: Alexis [name] MRN: [id number] Date of Birth: 1931-07-22  Subjective/Objective:      Admitted with Acute on Chronic Respiratory Failure              Action/Plan: Patient is active with [facility] for HHPT as prior to admission, pt would benefit from a Disease Management Program for CHF; CM will continue to follow for DCP.  Expected Discharge Date:   possibly 12/19/2014               Expected Discharge Plan:  Home w Home Health Services  Discharge planning Services  CM Consult    Choice offered to:  Patient  HH Arranged:  PT [facility] Agency:  [facility]  Status of Service:  In process, will continue to follow  Reola Mosher [phone] 12/16/2014, 11:18 AM

## 2014-12-16 NOTE — Care Management Important Message (Signed)
Important Message  Patient Details  Name: Alexis [name] MRN: [id number] Date of Birth: [birth year]   Medicare Important Message Given:  Yes-second notification given    [name] 12/16/2014, 12:24 PM

## 2014-12-16 NOTE — Progress Notes (Signed)
Subjective: No acute events overnight. She is doing well overall. She has R eyelid crusting but no changes in her vision.  Objective: Vital signs in last 24 hours: Filed Vitals:   12/16/14 0132 12/16/14 0615 12/16/14 0712 12/16/14 0732  BP:  134/51    Pulse:      Temp:  98.4 F (36.9 C)    TempSrc:  Oral    Resp:  16    Height:      Weight:   154 lb 6.4 oz (70.035 kg)   SpO2: 96% 94%  95%   Weight change:   Intake/Output Summary (Last 24 hours) at 12/16/14 0751 Last data filed at 12/15/14 2100  Gross per 24 hour  Intake    420 ml  Output   1350 ml  Net   -930 ml   General Apperance: NAD HEENT: Normocephalic, atraumatic, PERRL, EOMI, anicteric sclera, R conjunctiva injected but no drainage appreciated Neck: Supple, trachea midline Lungs: CTAB. No wheezes or rhonchi. Breathing comfortably Heart: Regular rate and rhythm, no murmur/rub/gallop Abdomen: Soft, nontender, nondistended, no rebound/guarding Extremities: Normal, atraumatic, warm and well perfused, 1+ BLE edema Pulses: 2+ throughout Skin: No rashes or lesions Neurologic: Alert and oriented x 3. CNII-XII intact. Normal strength and sensation  Lab Results: Basic Metabolic Panel:  Recent Labs Lab 12/15/14 0419 12/16/14 0501  NA 143 141  K 3.4* 3.7  CL 99* 98*  CO2 36* 34*  GLUCOSE 137* 107*  BUN 29* 30*  CREATININE 2.03* 2.05*  CALCIUM 9.0 8.7*  MG 2.1 2.1  PHOS 2.7 2.6   Liver Function Tests:  Recent Labs Lab 12/14/14 0325  AST 16  ALT 12*  ALKPHOS 60  BILITOT 0.9  PROT 5.8*  ALBUMIN 2.9*   CBC:  Recent Labs Lab 12/15/14 0419 12/16/14 0501  WBC 7.9 5.8  HGB 8.5* 7.7*  HCT 27.8* 25.2*  MCV 87.4 88.1  PLT 319 299   Cardiac Enzymes:  Recent Labs Lab 12/14/14 0325  TROPONINI 0.03    BNP:    Component Value Date/Time   BNP 581.2* 12/13/2014 1934   CBG:  Recent Labs Lab 12/14/14 2106 12/15/14 0623 12/15/14 1116 12/15/14 1634 12/15/14 2054 12/16/14 0657  GLUCAP 185*  118* 121* 294* 247* 90   Hemoglobin A1C: No results for input(s): HGBA1C in the last 168 hours.   Fasting Lipid Panel:  Recent Labs Lab 12/14/14 0325  CHOL 126  HDL 40*  LDLCALC 72  TRIG 71  CHOLHDL 3.2   Coagulation:  Recent Labs Lab 12/13/14 1934 12/14/14 0325 12/15/14 0419 12/16/14 0501  LABPROT 26.2* 29.5* 28.3* 28.5*  INR 2.44* 2.86* 2.70* 2.72*   Studies/Results: No results found. Medications: I have reviewed the patient's current medications. Scheduled Meds: . atorvastatin  20 mg Oral QHS  . cholecalciferol  1,000 Units Oral QHS  . escitalopram  10 mg Oral Daily  . fenofibrate  160 mg Oral Daily  . gabapentin  100 mg Oral Daily  . gabapentin  200 mg Oral QHS  . hydrALAZINE  25 mg Oral BID  . insulin aspart  0-9 Units Subcutaneous TID WC  . insulin glargine  10 Units Subcutaneous QHS  . ipratropium-albuterol  3 mL Nebulization Q6H  . iron polysaccharides  150 mg Oral BID  . isosorbide mononitrate  30 mg Oral Daily  . metoprolol tartrate  25 mg Oral BID  . multivitamin with minerals  1 tablet Oral Daily  . pantoprazole  40 mg Oral Daily  . potassium  chloride  20 mEq Oral BID  . sodium chloride  3 mL Intravenous Q12H  . Warfarin - Pharmacist Dosing Inpatient   Does not apply q1800   Continuous Infusions:  PRN Meds:.acetaminophen **OR** acetaminophen, albuterol Assessment/Plan: [name] is an 81 year old woman with history of dCHF, COPD on 2-3 L [place] at night and as needed, HTN, DM2, CKD3, and afib, presenting with dyspnea.   Acute on chronic hypoxic respiratory failure in setting of acute exacerbation of diastolic CHF: Elevated BNP and CXR with pulmonary edema and pleural effusions. 1.4L UOP yesterday and net -3.9 L since admission. Weight 154 from 156 at admission. She was last discharged at weight of 154. - Cardiology following, appreciate recommendations - d/c Lasix 40 IV BID, start torsemide 20mg  PO BID - ASA 81mg  daily  - Metoprolol tartrate 25  mg BID - Hydralazine 25 mg BID - Imdur 24hr 30 mg daily - Atorva 20 - Likely home tomorrow  Atrial Fibrillation: CHADS-VASc 6.  - Continue warfarin  Hyperthyroid: TSH 0.261 low and free T4 1.34 high. - tapazole 5 mg daily per cardiology - She will need outpatient endocrine follow up  Bacterial conjunctivitis: Erythromycin ophthalmic ointment R eye QID. May reduce to twice daily, if there is improvement in symptoms after a few days. Continue to complete a 5 day course (end after December 20, 2014)  AKI on CKD3: Creatinine 2.4, with baseline of 1.2-1.4. Discharge creatinine 1.6 on 7/8. Likely pre-renal given CHF exacerbation. Cr stable at 2.05 today. -Continue to monitor  COPD: on 2-3L  at home.  - Duonebs/Albuterol  DMII with peripheral neuropathy: A1c 7.7% on 7/6.  - Hold home Glipizide 10 mg daily. - Lantus 10 units qHS - SSI - Gabapentin 100 mg BID  HTN: stable - Metoprolol tartrate 25 mg BID - Hydralazine 25 mg BID - Imdur 24hr 30 mg daily - Hold Amlodipine 10 mg daily  Iron Deficiency Anemia: Hgb 7.7 today - Continue iron 150mg  BID - Continue to monitor  FEN/GI - Protonix - Carb/HH diet  VTE Ppx: Coumadin  Dispo: Disposition is deferred at this time, awaiting improvement of current medical problems.  Anticipated discharge in approximately 0-1 day(s).   The patient does have a current PCP Alexis Palau, FNP) and does not need an [facility] hospital follow-up appointment after discharge.  The patient does not have transportation limitations that hinder transportation to clinic appointments.  .Services Needed at time of discharge: Y = Yes, Blank = No PT:   OT:   RN:   Equipment:   Other:     LOS: 2 days   Lora Paula, MD 12/16/2014, 7:51 AM

## 2014-12-16 NOTE — Progress Notes (Signed)
Patient seen and examined. Case d/w residents in detail. I agree with findings and plan as documented in Dr. Joaquin Music note  Patient is much improved. Decreased crackles on lung exam. No LE edema. Would d/c IV lasix and start PO torsemide. Cardio recommendations appreciated. Patient also noted to have possible mild hyperthyroidism which may be contributing to her afib. Was started on tapazole. Outpatient PCP f/u. C/w home cardiac meds.   Of note, she also had R eye erythema and discharge- likely conjunctivitis. Started on erythromycin eye ointment.  Likely d/c in AM

## 2014-12-16 NOTE — Evaluation (Signed)
Physical Therapy Evaluation/ Discharge Patient Details Name: Alexis [name] MRN: [id number] DOB: [birth year] Today's Date: 12/16/2014   History of Present Illness  Pt admitted with acute on chronic respiratory failure. PMH - COPD, CAD, CHF  Clinical Impression  Pt with good mobility, maintaining sats 93-96% on 2L throughout activity. Pt reports she lives with son who works, she has started using RW all the time and needs BSC due to lasix. Pt reports she received HHPT previously and does not need it again. Pt with limited function at baseline and states she is currently at baseline without further therapy needs. Recommend pt work on controlled descent with sitting, use RW at all times and ambulate daily with nursing assist. Will sign off as pt report of baseline and currently mod I with basic mobility without desire for further therapy.     Follow Up Recommendations Supervision - Intermittent    Equipment Recommendations  3in1 (PT)    Recommendations for Other Services       Precautions / Restrictions Precautions Precautions: Fall      Mobility  Bed Mobility Overal bed mobility: Modified Independent                Transfers Overall transfer level: Needs assistance   Transfers: Sit to/from Stand Sit to Stand: Supervision         General transfer comment: cues for controlled descent to surface  Ambulation/Gait Ambulation/Gait assistance: Modified independent (Device/Increase time) Ambulation Distance (Feet): 175 Feet Assistive device: Rolling walker (2 wheeled) Gait Pattern/deviations: Step-through pattern;Decreased stride length   Gait velocity interpretation: Below normal speed for age/gender    Stairs            Wheelchair Mobility    Modified Rankin (Stroke Patients Only)       Balance Overall balance assessment: History of Falls   Sitting balance-Leahy Scale: Good       Standing balance-Leahy Scale: Fair                                Pertinent Vitals/Pain Pain Assessment: No/denies pain    Home Living Family/patient expects to be discharged to:: Private residence Living Arrangements: Children Available Help at Discharge: Family;Available PRN/intermittently Type of Home: House       Home Layout: One level Home Equipment: Walker - 2 wheels;Cane - single point;Shower seat;Toilet riser      Prior Function Level of Independence: Independent with assistive device(s)         Comments: Uses walker at home. History of 2 falls this year     Hand Dominance        Extremity/Trunk Assessment   Upper Extremity Assessment: Overall WFL for tasks assessed           Lower Extremity Assessment: Generalized weakness      Cervical / Trunk Assessment: Normal  Communication      Cognition Arousal/Alertness: Awake/alert Behavior During Therapy: WFL for tasks assessed/performed Overall Cognitive Status: Within Functional Limits for tasks assessed                      General Comments      Exercises        Assessment/Plan    PT Assessment Patent does not need any further PT services  PT Diagnosis     PT Problem List    PT Treatment Interventions     PT Goals (Current goals can be found  in the Care Plan section) Acute Rehab PT Goals PT Goal Formulation: All assessment and education complete, DC therapy    Frequency     Barriers to discharge        Co-evaluation               End of Session Equipment Utilized During Treatment: Oxygen Activity Tolerance: Patient tolerated treatment well Patient left: in chair;with call bell/phone within reach;with chair alarm set Nurse Communication: Mobility status         Time: 2233-6122 PT Time Calculation (min) (ACUTE ONLY): 21 min   Charges:   PT Evaluation $Initial PT Evaluation Tier I: 1 Procedure     PT G CodesDelorse Lek 12/16/2014, 12:56 PM Delaney Meigs, PT [phone]

## 2014-12-16 NOTE — Progress Notes (Signed)
Patient: Alexis [name] / Admit Date: 12/13/2014 / Date of Encounter: 12/16/2014, 9:21 AM   Subjective: Denies CP or SOB but has not been out of bed. Labs confirm hyperthyroidism.   Objective: Telemetry: atrial fib, rate controlled, occasional PVCs Physical Exam: Blood pressure 134/51, pulse 80, temperature 98.4 F (36.9 C), temperature source Oral, resp. rate 16, height  (1.575 m), weight 154 lb 6.4 oz (70.035 kg), SpO2 95 %. General: Well developed, well nourished WF, in no acute distress. Head: Normocephalic, atraumatic, sclera non-icteric, no xanthomas, nares are without discharge. Neck: JVP not elevated. Lungs: Bilateral crackles at bases, no wheezes or rhonchi. Breathing is unlabored. Heart: Irregularly irregular, rate controlled, S1 S2 without murmurs, rubs, or gallops.  Abdomen: Soft, non-tender, non-distended with normoactive bowel sounds. No rebound/guarding. Extremities: No clubbing or cyanosis. No edema. Distal pedal pulses are 2+ and equal bilaterally. Neuro: Alert and oriented X 3. Moves all extremities spontaneously. Psych: Responds to questions appropriately with a normal affect.   Intake/Output Summary (Last 24 hours) at 12/16/14 0921 Last data filed at 12/16/14 0911  Gross per 24 hour  Intake    540 ml  Output   1050 ml  Net   -510 ml    Inpatient Medications:  . atorvastatin  20 mg Oral QHS  . cholecalciferol  1,000 Units Oral QHS  . escitalopram  10 mg Oral Daily  . fenofibrate  160 mg Oral Daily  . gabapentin  100 mg Oral Daily  . gabapentin  200 mg Oral QHS  . hydrALAZINE  25 mg Oral BID  . insulin aspart  0-9 Units Subcutaneous TID WC  . insulin glargine  10 Units Subcutaneous QHS  . ipratropium-albuterol  3 mL Nebulization Q6H  . iron polysaccharides  150 mg Oral BID  . isosorbide mononitrate  30 mg Oral Daily  . metoprolol tartrate  25 mg Oral BID  . multivitamin with minerals  1 tablet Oral Daily  . pantoprazole  40 mg Oral Daily    . potassium chloride  20 mEq Oral BID  . sodium chloride  3 mL Intravenous Q12H  . Warfarin - Pharmacist Dosing Inpatient   Does not apply q1800   Infusions:    Labs:  Recent Labs  12/15/14 0419 12/16/14 0501  NA 143 141  K 3.4* 3.7  CL 99* 98*  CO2 36* 34*  GLUCOSE 137* 107*  BUN 29* 30*  CREATININE 2.03* 2.05*  CALCIUM 9.0 8.7*  MG 2.1 2.1  PHOS 2.7 2.6    Recent Labs  12/14/14 0325  AST 16  ALT 12*  ALKPHOS 60  BILITOT 0.9  PROT 5.8*  ALBUMIN 2.9*    Recent Labs  12/15/14 0419 12/16/14 0501  WBC 7.9 5.8  HGB 8.5* 7.7*  HCT 27.8* 25.2*  MCV 87.4 88.1  PLT 319 299    Recent Labs  12/14/14 0325  TROPONINI 0.03   Radiology/Studies:  Dg Chest 2 View  11/29/2014   CLINICAL DATA:  Shortness of breath.  EXAM: CHEST  2 VIEW  COMPARISON:  11/03/2014 .  FINDINGS: Cardiomegaly with pulmonary vascular prominence and diffuse interstitial prominence with small right pleural effusion. These findings consistent with congestive heart failure .  IMPRESSION: Congestive heart failure with pulmonary interstitial edema and small right pleural effusion. Pulmonary interstitial edema has progressed from prior study of 11/03/2014.   Electronically Signed   By: Maisie Fus  Register   On: 11/29/2014 14:50   Dg Chest Portable 1 View  12/13/2014  CLINICAL DATA:  Worsening shortness of breath, chest tightness  EXAM: PORTABLE CHEST - 1 VIEW  COMPARISON:  11/29/2014  FINDINGS: Patchy airspace opacities throughout both lungs involving bases more than apices, increased since previous exam. There is blunting of the right lateral costophrenic angle suggesting small effusion. Mild cardiomegaly stable. Multiple old left rib fractures. No pneumothorax.  IMPRESSION: 1. Progressive bilateral edema/infiltrates with probable small right effusion.   Electronically Signed   By: Corlis Leak M.D.   On: 12/13/2014 20:32     Assessment and Plan  55F with CAD, ?obstructive airway dz on 2L , mod LVH, HTN,  DM, anemia (EGD 11/2014: incidental esophageal diverticulum and stricture but otherwise unremarkable, not felt to be a candidate for more aggressive workup), CKD III, recently diagnosed persistent atrial fib, GERD, OSA, readmitted with CHF.   1. Acute on chronic respiratory failure 2. Acute on chronic diastolic CHF in the setting of salt restriction noncompliance 3. Chronic lung disease of unknown etiology/h/o obstructive airways disease 4. Probable deconditioning 5. CKD stage III-IV (recent baseline unclear, variable from 1.4-1.9) 6. Persistent atrial fibrillation, recently diagnosed 7. Hyperthyroidism, newly diagnosed 8. Hypokalemia 9. Recent severe anemia of unknown cause, see above  Weight only down 1 more lb (2lb since admission). Not clear if I/O's accurate. During last admission, d/c weight was 155lb but the admission before that she was 151lb at discharge. She still has crackles on exam. Will discuss with Dr. [name] but may want to continue Lasix another day before changing to oral form. Cr relatively stable today. Continue potassium. PT eval ordered yesterday - it would be helpful for her to get out of bed to assess her functional status. Continue rate control/anticoagulation for afib for now - we are deferring consideration of DCCV as outpatient.  Will need rx of hyperthyroidism - per IM. She also appears to have right eye blepharitis that will need treatment. F/u Hgb in AM due to slight downtrend.  Signed, Ronie Spies PA-C Pager: [phone]

## 2014-12-17 LAB — BASIC METABOLIC PANEL
Anion gap: 8 (ref 5–15)
BUN: 32 mg/dL — AB (ref 6–20)
CALCIUM: 8.6 mg/dL — AB (ref 8.9–10.3)
CO2: 32 mmol/L (ref 22–32)
CREATININE: 2.07 mg/dL — AB (ref 0.44–1.00)
Chloride: 97 mmol/L — ABNORMAL LOW (ref 101–111)
GFR calc Af Amer: 25 mL/min — ABNORMAL LOW (ref >60)
GFR calc non Af Amer: 21 mL/min — ABNORMAL LOW (ref >60)
GLUCOSE: 162 mg/dL — AB (ref 65–99)
Potassium: 3.6 mmol/L (ref 3.5–5.1)
SODIUM: 137 mmol/L (ref 135–145)

## 2014-12-17 LAB — CBC
HEMATOCRIT: 25.5 % — AB (ref 36.0–46.0)
Hemoglobin: 7.8 g/dL — ABNORMAL LOW (ref 12.0–15.0)
MCH: 26.5 pg (ref 26.0–34.0)
MCHC: 30.6 g/dL (ref 30.0–36.0)
MCV: 86.7 fL (ref 78.0–100.0)
PLATELETS: 331 10*3/uL (ref 150–400)
RBC: 2.94 MIL/uL — ABNORMAL LOW (ref 3.87–5.11)
RDW: 17.9 % — AB (ref 11.5–15.5)
WBC: 5 10*3/uL (ref 4.0–10.5)

## 2014-12-17 LAB — BRAIN NATRIURETIC PEPTIDE: B NATRIURETIC PEPTIDE 5: 759.7 pg/mL — AB (ref 0.0–100.0)

## 2014-12-17 LAB — GLUCOSE, CAPILLARY
GLUCOSE-CAPILLARY: 123 mg/dL — AB (ref 65–99)
GLUCOSE-CAPILLARY: 190 mg/dL — AB (ref 65–99)

## 2014-12-17 LAB — PROTIME-INR
INR: 2.73 — ABNORMAL HIGH (ref 0.00–1.49)
PROTHROMBIN TIME: 28.5 s — AB (ref 11.6–15.2)

## 2014-12-17 LAB — T3: T3 TOTAL: 80 ng/dL (ref 71–180)

## 2014-12-17 MED ORDER — WARFARIN SODIUM 2.5 MG PO TABS: 2.5000 mg | ORAL_TABLET | Freq: Every day | ORAL | Status: DC

## 2014-12-17 MED ORDER — ERYTHROMYCIN 5 MG/GM OP OINT
TOPICAL_OINTMENT | Freq: Four times a day (QID) | OPHTHALMIC | Status: DC
Start: 2014-12-17 — End: 2014-12-24

## 2014-12-17 MED ORDER — POTASSIUM CHLORIDE CRYS ER 20 MEQ PO TBCR: 20.0000 meq | EXTENDED_RELEASE_TABLET | Freq: Two times a day (BID) | ORAL | Status: DC

## 2014-12-17 MED ORDER — METHIMAZOLE 5 MG PO TABS: 5.0000 mg | ORAL_TABLET | Freq: Every day | ORAL | Status: DC

## 2014-12-17 MED ORDER — TORSEMIDE 20 MG PO TABS: 20.0000 mg | ORAL_TABLET | Freq: Two times a day (BID) | ORAL | Status: DC

## 2014-12-17 NOTE — Progress Notes (Signed)
Pt d/c'd to home with family. D/c instructuctions given to and d/w pt and daughter. Verbalizes understanding

## 2014-12-17 NOTE — Care Management Note (Signed)
Case Management Note  Patient Details  Name: Alexis [name] MRN: [id number] Date of Birth: 1931/12/16  Subjective/Objective:                   Acute dCHF exacerbation  Action/Plan:  D/c planning.  Expected Discharge Date:  12/17/14              Expected Discharge Plan:  Home In-House Referral:  None  Discharge planning Services  CM Consult  Post Acute Care Choice:    Choice offered to:  Patient  DME Arranged:  3 in 1 DME Agency:   [facility]   HH Arranged:   HH Agency:    Status of Service: completed.  Medicare Important Message Given:  Yes-second notification given Date Medicare IM Given:   Medicare IM give by:    Date Additional Medicare IM Given:  Additional Medicare Important Message give by:  Per note, Creed Copper on 12/16/14.   If discussed at Long Length of Stay Meetings, dates discussed:    Additional Comments:  1:50pm   Received referral for BSC 3 in 1 from patient's nurse Gigi Gin) via phone. Spoke with patient & son Haaniya Brewton via phone, verified plan for d/c today and no other CM needs at this time.  Patient gave me verbal permission to speak with her son Aliyyah Trenkamp , who was visiting her in the room and coordinate DME.   DME order for [facility] 3 in 1 verified and confirmed  with provider ([facility]).   Spoke with Fayrene Fearing @ [facility], states 3 in 1 will be delivered to patient's room prior to discharge today.  TCT patient's nurse and advised of above.   AVS updated.  Per PT notes, patient does not  want and will no longer need HHPT.    TCT [facility], spoke with [facility] states hospital liaison has already rolled over phone line to the main office for the today.   Advised I will follow up with hospital liaison tomorrow with an update.   Keeyon Privitera H. Excell Seltzer RN, BSN, CCM 12/17/2014, 5:11 PM

## 2014-12-17 NOTE — Discharge Summary (Signed)
Name: Alexis [name] MRN: [id number] DOB: 1931-11-01 81 y.o. 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     albuterol 108 (90 BASE) MCG/ACT inhaler  Commonly known as:  PROVENTIL HFA;VENTOLIN HFA  Inhale 2 puffs into the lungs every 6 (six) hours as needed for wheezing or shortness of breath.     cholecalciferol 1000 UNITS tablet  Commonly known as:  VITAMIN D  Take 1,000 Units by mouth at bedtime.     erythromycin ophthalmic ointment  Place into the right eye every 6 (six) hours. Use 4 times daily until 12/21/14, then STOP. DO NOT USE UNTIL EMPTY.     escitalopram 10 MG tablet  Commonly known as:  LEXAPRO  Take 10 mg by mouth daily.     fenofibrate 160 MG tablet  Take 160 mg by mouth daily.     gabapentin 100 MG capsule  Commonly known as:  NEURONTIN  Take 100-200 mg by mouth 2 (two) times daily. Take 1 capsule (100 mg) morning, take 2 capsules (200 mg) at bedtime     glipiZIDE 10 MG 24 hr tablet  Commonly known as:  GLUCOTROL XL  Take 10 mg by mouth daily.     GOLD BOND MAXIMUM RELIEF 1-1 % Crea  Generic drug:  Pramoxine-Menthol  Apply 1  application topically 2 (two) times daily as needed (for would care).     hydrALAZINE 25 MG tablet  Commonly known as:  APRESOLINE  Take 25 mg by mouth 2 (two) times daily.     iron polysaccharides 150 MG capsule  Commonly known as:  NIFEREX  Take 1 capsule (150 mg total) by mouth 2 (two) times daily.     isosorbide mononitrate 30 MG 24 hr tablet  Commonly known as:  IMDUR  Take 30 mg by mouth daily.     LIPITOR 20 MG tablet  Generic drug:  atorvastatin  Take 20 mg by mouth at bedtime.     methimazole 5 MG tablet  Commonly known as:  TAPAZOLE  Take 1 tablet (5 mg total) by mouth daily.     metoprolol tartrate 25 MG tablet  Commonly known as:  LOPRESSOR  Take 1 tablet (25 mg total) by mouth 2 (two) times daily.     multivitamin with minerals Tabs tablet  Take 1 tablet by mouth daily.     OXYGEN  Inhale into the lungs.     pantoprazole 40 MG tablet  Commonly known as:  PROTONIX  Take 1 tablet (40 mg total) by mouth daily.     potassium  chloride SA 20 MEQ tablet  Commonly known as:  K-DUR,KLOR-CON  Take 1 tablet (20 mEq total) by mouth 2 (two) times daily.     torsemide 20 MG tablet  Commonly known as:  DEMADEX  Take 1 tablet (20 mg total) by mouth 2 (two) times daily.     warfarin 2.5 MG tablet  Commonly known as:  COUMADIN  Take 1 tablet (2.5 mg total) by mouth daily.        Disposition and follow-up:   Alexis [name] was discharged from [facility] in Stable condition.  At the hospital follow up visit please address:  1.  Acute on Chronic Hypoxic Respiratory Failure Secondary to Acute Exacerbation of (HFpEF) Heart Failure with Preserved Ejection Fraction: She was continued on ASA, Metoprolol tartrate 25 mg BID, Hydralazine 25 mg BID, Imdur 24hr 30 mg daily, Atorva 20. Her home Amlodipine was held per cardiology. With diuresis her weight came down to 152 prior to discharge. She was started on torsemide 20mg  PO BID.   Atrial  Fibrillation, Hyperthyroidism: She was started on Tapazole 5 mg daily. TSH mildly decreased (0.261), fT4 mildly increased (1.34).She will need outpatient endocrine follow up.  AKI on CKD3:Her creatinine trended down to 2.07  2.  Labs / imaging needed at time of follow-up: BMP  3.  Pending labs/ test needing follow-up: None  Follow-up Appointments: Follow-up Information    Follow up with Elizabeth Palau, FNP On 12/21/2014.   Specialty:  Nurse Practitioner   Why:  1:00 PM for hospital follow up   Contact information:   [address] [place] [address] [phone]       Follow up with Chrystie Nose, MD.   Specialty:  Cardiology   Why:  Please follow up in 1-2 weeks.    Contact information:   [address] [place] [address] [phone]       Follow up with Talmage Coin, MD.   Specialty:  Endocrinology   Contact information:   [address] [place] [address] [phone]       Follow up with [facility].   Why:  Will deliver beside commode to room prior to discharge.   Contact information:   [address] [place] [address] [phone]       Consultations: Treatment Team:  Joellyn Rued, MD (cardiology)  Procedures Performed:  Dg Chest 2 View  11/29/2014   CLINICAL DATA:  Shortness of breath.  EXAM: CHEST  2 VIEW  COMPARISON:  11/03/2014 .  FINDINGS: Cardiomegaly with pulmonary vascular prominence and diffuse interstitial prominence with small right pleural effusion. These findings consistent with congestive heart failure .  IMPRESSION: Congestive heart failure with pulmonary interstitial edema and small right pleural effusion. Pulmonary interstitial edema has progressed from prior study of 11/03/2014.   Electronically Signed   By: Maisie Fus  Register   On: 11/29/2014 14:50   Dg Chest Portable 1 View  12/13/2014   CLINICAL DATA:  Worsening shortness of breath, chest tightness  EXAM:  PORTABLE CHEST - 1 VIEW  COMPARISON:  11/29/2014  FINDINGS: Patchy airspace opacities throughout both lungs involving bases more than apices, increased since previous exam. There is blunting of the right lateral costophrenic angle suggesting small effusion. Mild cardiomegaly stable. Multiple old left rib fractures. No pneumothorax.  IMPRESSION: 1. Progressive bilateral edema/infiltrates with probable small right effusion.   Electronically Signed   By: Ronald Pippins.D.  On: 12/13/2014 20:32    Admission HPI: Ms. Zavada is an 81 year old woman with history of dCHF, COPD on 2-3 LN [place] at night and as needed, HTN, DM2, CKD3, and afib, presenting with dyspnea.   She was recently discharged from [facility] on 7/8 for treatment of acute dCHF exacerbation. At that time, she was diuresed to dry weight of 151 lb. She was also noticed to have new onset afib at that time. Due to unknown source of GI bleeding, she was not started on anticoagulation at that time (CHADSVASc 6). Outpatient followup with Cardiology opted to start Warfarin given her elevated risk of stroke. During that outpatient visit (7/13), her weight was noticed to be increased to 158 pounds.  Today, she complains of acute onset dyspnea beginning around 4 pm. She had just finished eating and was sitting in her recliner when she became acutely short of breath. She reports feeling completely normal before the acute event. She denies chest pain, palpitations, or heaves surrounding the episode. She denies dietary or medication noncompliance. She reports she is taking her Lasix as prescribed from her last discharge, with 1 pill BID. However, she is not sure of the pill dosage. She denies recent fever or sick contacts.   O2 sat in the ED was 70% on presentation. The patient was started was given albuterol and started on BiPAP, with resolution of symptoms. She was off BiPAP, satting >92% on 2-4 L [place] at time of interview. During the  physical exam, the patient complained of mild right parasternal chest pain with deep inhalation, relieved with leaning forward. She had not noticed this pain before.   Hospital Course by problem list:   1. Acute on Chronic Hypoxic Respiratory Failure Secondary to Acute Exacerbation of (HFpEF) Heart Failure with Preserved Ejection Fraction: Patient presents with sudden onset acute dyspnea with O2 sats < 70%.Most likely CHF exacerbation.She had some abdominal distension and her symptoms improved with IV Lasix and BiPAP in the ED. She was seen in consultation by cardiology. She was admitted for further evaluation. She was started on Lasix 40 IV BID. She was continued on ASA, Metoprolol tartrate 25 mg BID, Hydralazine 25 mg BID, Imdur 24hr 30 mg daily, Atorva 20. Her home Amlodipine was held per cardiology. With diuresis her weight came down to 152 prior to discharge. Her IV Lasix was discontinued and she was started on torsemide 20mg  PO BID.   2. Atrial Fibrillation, Hyperthyroidism: CHADS-VASc 6. She remained rate controlled on metoprolol. She was started on Tapazole 5 mg daily in the setting of suspected hyperthyroidism. TSH mildly decreased (0.261), fT4 mildly increased (1.34).She will need outpatient endocrine follow up. She was continued on warfarin.  3. AKI on CKD3: Creatinine 2.4, with baseline of 1.2-1.4. Discharge creatinine 1.6 on 7/8. Likely pre-renal given CHF exacerbation. Her creatinine trended down to 2.07  Discharge Vitals:   BP 127/41 mmHg  Pulse 61  Temp(Src) 98.4 F (36.9 C) (Oral)  Resp 18  Ht 5\' 2"  (1.575 m)  Wt 152 lb 3.2 oz (69.037 kg)  BMI 27.83 kg/m2  SpO2 98%  Discharge Labs:  Results for orders placed or performed during the hospital encounter of 12/13/14 (from the past 24 hour(s))  Glucose, capillary     Status: Abnormal   Collection Time: 12/16/14 11:18 AM  Result Value Ref Range   Glucose-Capillary 122 (H) 65 - 99 mg/dL  Glucose, capillary     Status:  Abnormal   Collection Time: 12/16/14  4:28 PM  Result Value Ref Range   Glucose-Capillary 169 (H) 65 - 99 mg/dL  Glucose, capillary     Status: Abnormal   Collection Time: 12/16/14  9:22 PM  Result Value Ref Range   Glucose-Capillary 252 (H) 65 - 99 mg/dL  Brain natriuretic peptide     Status: Abnormal   Collection Time: 12/17/14  2:50 AM  Result Value Ref Range   B Natriuretic Peptide 759.7 (H) 0.0 - 100.0 pg/mL  Protime-INR     Status: Abnormal   Collection Time: 12/17/14  2:55 AM  Result Value Ref Range   Prothrombin Time 28.5 (H) 11.6 - 15.2 seconds   INR 2.73 (H) 0.00 - 1.49  Basic metabolic panel     Status: Abnormal   Collection Time: 12/17/14  2:55 AM  Result Value Ref Range   Sodium 137 135 - 145 mmol/L   Potassium 3.6 3.5 - 5.1 mmol/L   Chloride 97 (L) 101 - 111 mmol/L   CO2 32 22 - 32 mmol/L   Glucose, Bld 162 (H) 65 - 99 mg/dL   BUN 32 (H) 6 - 20 mg/dL   Creatinine, Ser 1.61 (H) 0.44 - 1.00 mg/dL   Calcium 8.6 (L) 8.9 - 10.3 mg/dL   GFR calc non Af Amer 21 (L) >60 mL/min   GFR calc Af Amer 25 (L) >60 mL/min   Anion gap 8 5 - 15  CBC     Status: Abnormal   Collection Time: 12/17/14  2:55 AM  Result Value Ref Range   WBC 5.0 4.0 - 10.5 K/uL   RBC 2.94 (L) 3.87 - 5.11 MIL/uL   Hemoglobin 7.8 (L) 12.0 - 15.0 g/dL   HCT 09.6 (L) 04.5 - 40.9 %   MCV 86.7 78.0 - 100.0 fL   MCH 26.5 26.0 - 34.0 pg   MCHC 30.6 30.0 - 36.0 g/dL   RDW 81.1 (H) 91.4 - 78.2 %   Platelets 331 150 - 400 K/uL  Glucose, capillary     Status: Abnormal   Collection Time: 12/17/14  6:27 AM  Result Value Ref Range   Glucose-Capillary 123 (H) 65 - 99 mg/dL    Signed: Lora Paula, MD 12/19/2014, 2:55 PM    Services Ordered on Discharge: none Equipment Ordered on Discharge: 3 in 1

## 2014-12-17 NOTE — Progress Notes (Addendum)
ANTICOAGULATION CONSULT NOTE   Pharmacy Consult for Warfarin Indication: atrial fibrillation  Allergies  Allergen Reactions  . Other     novocaine broke mouth out  . Codeine Hives  . Penicillins Hives  . Procaine Hives  . Yellow Dyes (Non-Tartrazine) Nausea And Vomiting    "deathly allergic" No other information given to explain reaction.    Patient Measurements: Height: 5\' 2"  (157.5 cm) Weight: 152 lb 3.2 oz (69.037 kg) IBW/kg (Calculated) : 50.1   Vital Signs: Temp: 98.4 F (36.9 C) (07/23 0441) Temp Source: Oral (07/23 0441) BP: 113/36 mmHg (07/23 0932) Pulse Rate: 61 (07/23 0441)  Labs:  Recent Labs  12/15/14 0419 12/16/14 0501 12/17/14 0255  HGB 8.5* 7.7* 7.8*  HCT 27.8* 25.2* 25.5*  PLT 319 299 331  LABPROT 28.3* 28.5* 28.5*  INR 2.70* 2.72* 2.73*  CREATININE 2.03* 2.05* 2.07*    Estimated Creatinine Clearance: 19.7 mL/min (by C-G formula based on Cr of 2.07).   Medical History: Past Medical History  Diagnosis Date  . Hypertension   . Neuropathy   . Anxiety   . PONV (postoperative nausea and vomiting)   . CHF (congestive heart failure)   . COPD (chronic obstructive pulmonary disease)     on home, nocturnal oxygen.   . Family history of adverse reaction to anesthesia   . Cardiomyopathy   . Diabetes mellitus without complication     TYPE 2  . Macular degeneration, bilateral     Medications:  Scheduled:  . atorvastatin  20 mg Oral QHS  . cholecalciferol  1,000 Units Oral QHS  . erythromycin   Right Eye 4 times per day  . escitalopram  10 mg Oral Daily  . fenofibrate  160 mg Oral Daily  . gabapentin  100 mg Oral Daily  . gabapentin  200 mg Oral QHS  . hydrALAZINE  25 mg Oral BID  . insulin aspart  0-9 Units Subcutaneous TID WC  . insulin glargine  10 Units Subcutaneous QHS  . ipratropium-albuterol  3 mL Nebulization BID  . iron polysaccharides  150 mg Oral BID  . isosorbide mononitrate  30 mg Oral Daily  . methimazole  5 mg Oral Daily   . metoprolol tartrate  25 mg Oral BID  . multivitamin with minerals  1 tablet Oral Daily  . pantoprazole  40 mg Oral Daily  . potassium chloride  20 mEq Oral BID  . sodium chloride  3 mL Intravenous Q12H  . torsemide  20 mg Oral BID  . Warfarin - Pharmacist Dosing Inpatient   Does not apply q1800    Assessment: 81yo female c/o SOB and chest tightness, had recently been dx'd w/ acute HF and Afib and on Coumadin.   Pharmacy has been consulted to resume coumadin.  Patient PTA coumadin dose was Alternate 5mg  one day then 2.5 mg the other day.  Current INR 2.73. She has been here since 7/20 and has received one dose of coumadin on 7/21 and prior to this she last took coumadin at home on 7/18.   Goal of Therapy:  INR 2-3 Monitor platelets by anticoagulation protocol: Yes   Plan:  -With consistently elevated INR would consider holding the dose today then begin 2.5mg /day on 12/18/14 with close INR follow-up (consider Tuesday or Wed?) -Daily PT/INR in remains inpatient  Harland German, Pharm D 12/17/2014 9:49 AM

## 2014-12-17 NOTE — Progress Notes (Signed)
Subjective:  Feels better today and wants to go home.  No shortness of breath or chest pain.  Objective:  Vital Signs in the last 24 hours: BP 113/36 mmHg  Pulse 61  Temp(Src) 98.4 F (36.9 C) (Oral)  Resp 18  Ht 5\' 2"  (1.575 m)  Wt 69.037 kg (152 lb 3.2 oz)  BMI 27.83 kg/m2  SpO2 98%  Physical Exam: Elderly female in no acute distress Lungs:  Clear  Cardiac:  Irregular rhythm, normal S1 and S2, no S3, 1 to 2/6 systolic murmur Extremities:  No edema present  Intake/Output from previous day: 07/22 0701 - 07/23 0700 In: 460 [P.O.:460] Out: 1701 [Urine:1700; Stool:1] Weight Filed Weights   12/15/14 0415 12/16/14 0712 12/17/14 0441  Weight: 70.353 kg (155 lb 1.6 oz) 70.035 kg (154 lb 6.4 oz) 69.037 kg (152 lb 3.2 oz)    Lab Results: Basic Metabolic Panel:  Recent Labs  38/46/65 0501 12/17/14 0255  NA 141 137  K 3.7 3.6  CL 98* 97*  CO2 34* 32  GLUCOSE 107* 162*  BUN 30* 32*  CREATININE 2.05* 2.07*    CBC:  Recent Labs  12/16/14 0501 12/17/14 0255  WBC 5.8 5.0  HGB 7.7* 7.8*  HCT 25.2* 25.5*  MCV 88.1 86.7  PLT 299 331    BNP    Component Value Date/Time   BNP 759.7* 12/17/2014 0250    PROTIME: Lab Results  Component Value Date   INR 2.73* 12/17/2014   INR 2.72* 12/16/2014   INR 2.70* 12/15/2014    Telemetry: Atrial fibrillation with occasional PVCs  Assessment/Plan:  1.  Acute on chronic diastolic heart failure likely worse with severe anemia 2.  Persistent atrial fibrillation 3.  Stage 3-4 chronic kidney disease 4.  Recently diagnosed hyperthyroidism  Recommendations:  She is significantly anemic but feels reasonable.  From a cardiac viewpoint K to go home on torsemide 20 mg twice daily.  Needs cardiology follow-up in one to 2 weeks on discharge.  He also will need endocrinology follow-up.      Darden Palmer  MD [facility]  12/17/2014, 12:27 PM

## 2014-12-17 NOTE — Discharge Instructions (Signed)
1. You have a follow up appointment as follows:  Alexis [name]  On 12/21/2014 1:00 PM for hospital follow up  [address] Alexis [name] [place] [address] [phone]  Alexis Nose, MD  Please follow up in 1-2 weeks.  [address] Alexis [name] [address] [place] [address] [phone]  2. Please take all medications as previously prescribed with the following changes:  STOP taking Lasix. Please take Torsemide 20 mg twice daily instead.   Start taking Tapazole 5 mg daily for your thyroid.   Take potassium 20 mEq twice daily for low potassium.   Use erythromycin eye ointment four times daily for 4 more days (end date 12/20/14). DO NOT CONTINUE TO USE AFTER THIS.   Take Coumadin 2.5 mg daily until you see your PCP next week on Wednesday at which time you NEED TO HAVE YOUR INR CHECKED.   Please make an appointment with an endocrinologist. A number for one is listed below:  Alexis Coin, MD  [address] [place] [address] [phone]  3. If you have worsening of your symptoms or new symptoms arise, please call the clinic [phone]), or go to the ER immediately if symptoms are severe.  You have done a great job in taking all your medications. Please continue to do this.

## 2014-12-17 NOTE — Progress Notes (Signed)
Subjective: No complaints today. Breathing well. Ready to go home.   Objective: Vital signs in last 24 hours: Filed Vitals:   12/16/14 1935 12/16/14 2125 12/16/14 2320 12/17/14 0441  BP:  126/43 122/44 127/41  Pulse: 76 82 72 61  Temp:  98.5 F (36.9 C)  98.4 F (36.9 C)  TempSrc:  Oral  Oral  Resp: 18 18  18   Height:      Weight:    152 lb 3.2 oz (69.037 kg)  SpO2: 98% 94%  95%   Weight change:   Intake/Output Summary (Last 24 hours) at 12/17/14 0711 Last data filed at 12/17/14 0442  Gross per 24 hour  Intake    460 ml  Output   1701 ml  Net  -1241 ml   Physical Exam: General: Elderly white female, alert, cooperative, NAD. On [place].  HEENT: PERRL, EOMI. Moist mucus membranes Neck: Full range of motion without pain, supple, no lymphadenopathy or carotid bruits Lungs: Air entry equal bilaterally, normal work of respiration, no wheezes or rhonchi. Bibasilar rales.  Heart: RRR, no murmurs, gallops, or rubs Abdomen: Soft, non-tender, non-distended, BS + Extremities: No cyanosis, clubbing, or edema Neurologic: Alert & oriented X3, cranial nerves II-XII intact, strength grossly intact, sensation intact to light touch   Lab Results: Basic Metabolic Panel:  Recent Labs Lab 12/15/14 0419 12/16/14 0501 12/17/14 0255  NA 143 141 137  K 3.4* 3.7 3.6  CL 99* 98* 97*  CO2 36* 34* 32  GLUCOSE 137* 107* 162*  BUN 29* 30* 32*  CREATININE 2.03* 2.05* 2.07*  CALCIUM 9.0 8.7* 8.6*  MG 2.1 2.1  --   PHOS 2.7 2.6  --    Liver Function Tests:  Recent Labs Lab 12/14/14 0325  AST 16  ALT 12*  ALKPHOS 60  BILITOT 0.9  PROT 5.8*  ALBUMIN 2.9*   CBC:  Recent Labs Lab 12/16/14 0501 12/17/14 0255  WBC 5.8 5.0  HGB 7.7* 7.8*  HCT 25.2* 25.5*  MCV 88.1 86.7  PLT 299 331   Cardiac Enzymes:  Recent Labs Lab 12/14/14 0325  TROPONINI 0.03    BNP:    Component Value Date/Time   BNP 759.7* 12/17/2014 0250   CBG:  Recent Labs Lab 12/15/14 2054  12/16/14 0657 12/16/14 1118 12/16/14 1628 12/16/14 2122 12/17/14 0627  GLUCAP 247* 90 122* 169* 252* 123*    Fasting Lipid Panel:  Recent Labs Lab 12/14/14 0325  CHOL 126  HDL 40*  LDLCALC 72  TRIG 71  CHOLHDL 3.2   Coagulation:  Recent Labs Lab 12/14/14 0325 12/15/14 0419 12/16/14 0501 12/17/14 0255  LABPROT 29.5* 28.3* 28.5* 28.5*  INR 2.86* 2.70* 2.72* 2.73*    Medications: I have reviewed the patient's current medications. Scheduled Meds: . atorvastatin  20 mg Oral QHS  . cholecalciferol  1,000 Units Oral QHS  . erythromycin   Right Eye 4 times per day  . escitalopram  10 mg Oral Daily  . fenofibrate  160 mg Oral Daily  . gabapentin  100 mg Oral Daily  . gabapentin  200 mg Oral QHS  . hydrALAZINE  25 mg Oral BID  . insulin aspart  0-9 Units Subcutaneous TID WC  . insulin glargine  10 Units Subcutaneous QHS  . ipratropium-albuterol  3 mL Nebulization BID  . iron polysaccharides  150 mg Oral BID  . isosorbide mononitrate  30 mg Oral Daily  . methimazole  5 mg Oral Daily  . metoprolol tartrate  25 mg  Oral BID  . multivitamin with minerals  1 tablet Oral Daily  . pantoprazole  40 mg Oral Daily  . potassium chloride  20 mEq Oral BID  . sodium chloride  3 mL Intravenous Q12H  . torsemide  20 mg Oral BID  . Warfarin - Pharmacist Dosing Inpatient   Does not apply q1800   Continuous Infusions:  PRN Meds:.acetaminophen **OR** acetaminophen, albuterol   Assessment/Plan: 81 y.o. female w/ PMHx of chronic dCHF, COPD, DM type II, HTN, admitted for acute on chronic diastolic CHF.   Acute on Chronic dCHF: Weight 152 lbs today, decreased from yesterday. UO ~2L in past 24 hours. Cr stable. Still w/ mild bibasilar crackles. No SOB.  -Cardiology following, appreciate recommendations -Continue Torsemide 20 mg bid - ASA -Lopressor 25 mg bid -Hydralazine 25 mg bid, Imdur 30 mg daily -Continue statin  Atrial Fibrillation: CHADS-VASc 6. ZOX0.73 today. Started on  Tapazole 5 mg daily yesterday in the setting of suspected hyperthyroidism. TSH mildly decreased (0.261), fT4 mildly increased (1.34).  -Continue Coumadin 2.5 mg daily; hold today, resume tomorrow. Will need repeat INR on Tuesday or Wednesday.  -Continue Tapazole for now. Patient will need repeat TFT's w/ PCP.    Hyperthyroidism: Thyroid studies as above. May be abnormal in the setting of acute illness given 2 recent hospital admissions.  -Continue Tapazole 5 mg daily per cardiology -She will need outpatient endocrine follow up  Bacterial conjunctivitis: Somewhat improved.  -Continue Erythromycin ophthalmic ointment R eye qid. Continue to complete a 5 day course (end date 12/20/14)  AKI on CKD3: Creatinine currently stable at 2.07, with previous baseline of 1.2-1.4.Discharge creatinine 1.6 on 7/8. Trend as follows:   Recent Labs Lab 12/13/14 2016 12/14/14 0325 12/15/14 0419 12/16/14 0501 12/17/14 0255  CREATININE 2.40* 2.34* 2.03* 2.05* 2.07*  -Repeat BMP in PCP clinic  COPD: On 2-3L [place] at home.  -Duonebs/Albuterol  DM type II w/ Peripheral Neuropathy: HbA1c 7.7% on 7/6.  -Hold home Glipizide 10 mg daily. -Lantus 10 units qhs -SSI -Gabapentin 100 mg bid  HTN: Stable.  -Continue Metoprolol tartrate 25 mg bid, Hydralazine 25 mg bid, Imdur 24hr 30 mg daily -Continue to hold Amlodipine 10 mg daily given normotensive. Can possibly restart in clinic.   Iron Deficiency Anemia: Hb stable, 7.8 today.  -Continue iron 150 mg bid -Repeat CBC in clinic  VTE Ppx: Coumadin  Dispo: Anticipated discharge today.  The patient does have a current PCP Elizabeth Palau, FNP) and does not need an [facility] hospital follow-up appointment after discharge.  The patient does not have transportation limitations that hinder transportation to clinic appointments.  .Services Needed at time of discharge: Y = Yes, Blank = No PT:   OT:   RN:   Equipment:   Other:     LOS: 3 days   Courtney Paris,  MD 12/17/2014, 7:11 AM

## 2014-12-18 NOTE — Progress Notes (Signed)
TCT Tiffany @ [facility] [phone], advised patient d/c'd on 12/17/14.    Patient did not want or need additional HHPT services at this time.  Patient evaluated by PT during hospital stay and no recommendation for continuation of HHPT.   States she will notify office.

## 2014-12-19 ENCOUNTER — Encounter: Payer: Self-pay | Admitting: Pulmonary Disease

## 2014-12-19 ENCOUNTER — Ambulatory Visit (INDEPENDENT_AMBULATORY_CARE_PROVIDER_SITE_OTHER): Payer: Medicare Other | Admitting: Pulmonary Disease

## 2014-12-19 ENCOUNTER — Ambulatory Visit (INDEPENDENT_AMBULATORY_CARE_PROVIDER_SITE_OTHER): Payer: Medicare Other | Admitting: Pharmacist Clinician (PhC)/ Clinical Pharmacy Specialist

## 2014-12-19 VITALS — BP 116/60 | HR 68 | Temp 97.1°F | Ht 62.0 in | Wt 154.4 lb

## 2014-12-19 DIAGNOSIS — Z7901 Long term (current) use of anticoagulants: Secondary | ICD-10-CM

## 2014-12-19 DIAGNOSIS — I480 Paroxysmal atrial fibrillation: Secondary | ICD-10-CM | POA: Diagnosis not present

## 2014-12-19 DIAGNOSIS — I481 Persistent atrial fibrillation: Secondary | ICD-10-CM | POA: Diagnosis not present

## 2014-12-19 DIAGNOSIS — R0902 Hypoxemia: Secondary | ICD-10-CM | POA: Diagnosis not present

## 2014-12-19 DIAGNOSIS — I4819 Other persistent atrial fibrillation: Secondary | ICD-10-CM

## 2014-12-19 LAB — POCT INR: INR: 2.2

## 2014-12-19 NOTE — Patient Instructions (Signed)
1. Your breathing problems are likely due to your heart failure and intermittent abnormal heart rhythm. Please follow Dr. Blanchie Dessert instructions. 2. You could possibly have COPD (chronic obstructive pulmonary disease) given her exposure to tobacco smoke from her husband. We are going to check breathing tests before your next appointment to determine if this is the case. 3. I am going to check an overnight oxygen test to determine if you need oxygen while sleeping. He should perform this on room air (off her oxygen) if you can tolerate it. If not, please weigh her oxygen and simply document how much oxygen you're using during the test. 4. We are going to do a walk test before your next appointment to determine how much oxygen you need volume or exerting yourself. 5. We will schedule you back to clinic in 4-6 weeks but feel free to call my office if you have any further questions or concerns.

## 2014-12-19 NOTE — Progress Notes (Signed)
Subjective:    Patient ID: Alexis [name], female    DOB: 1931/05/29, 81 y.o.   MRN: [id number]  HPI She has been hospitalized twice in July for breathing problems.  She reports during each discharge she felt like her breathing had significantly improved on discharge.  She was discharged after her recent hospitalization on 2 L/m continuously. Before that she was using oxygen at night while sleeping. She has been using it more over the last 2 months because she feels it helps with her dyspnea. She reports her dyspnea is primarily with exertion.  She was started on Coumadin recently because of her paroxysmal atrial fibrillation. She reports that her last episode of dyspnea was sudden in onset after having no problems during that same day with physical therapy. She denies any coughing.  She reports rare wheezing. She denies any history of bronchitis but has pneumonia many times, but the last time was a couple of years ago. She denies any palpitations, chest pain, or chest tightness. No reflux or dyspepsia. No morning brash water taste. She denies any abdominal pain, nausea, or emesis. She denies any hematochezia but has had melena. She did undergo an upper endoscopy during her recent admissions without finding a source of bleeding.    Review of Systems She denies any rashes or abnormal bruising. No focal weakness, numbness, or tingling. A pertinent 14 point review of systems is negative except as per the history of presenting illness.  Allergies  Allergen Reactions  . Other     novocaine broke mouth out  . Codeine Hives  . Penicillins Hives  . Procaine Hives  . Yellow Dyes (Non-Tartrazine) Nausea And Vomiting    "deathly allergic" No other information given to explain reaction.   Current Outpatient Prescriptions on File Prior to Visit  Medication Sig Dispense Refill  . acetaminophen (TYLENOL) 325 MG tablet Take 650 mg by mouth every 6 (six) hours as needed for mild pain.     Marland Kitchen albuterol  (PROVENTIL HFA;VENTOLIN HFA) 108 (90 BASE) MCG/ACT inhaler Inhale 2 puffs into the lungs every 6 (six) hours as needed for wheezing or shortness of breath. 1 Inhaler 3  . atorvastatin (LIPITOR) 20 MG tablet Take 20 mg by mouth at bedtime.    . cholecalciferol (VITAMIN D) 1000 UNITS tablet Take 1,000 Units by mouth at bedtime.     Marland Kitchen erythromycin ophthalmic ointment Place into the right eye every 6 (six) hours. Use 4 times daily until 12/21/14, then STOP. DO NOT USE UNTIL EMPTY. 3.5 g 0  . escitalopram (LEXAPRO) 10 MG tablet Take 10 mg by mouth daily.    . fenofibrate 160 MG tablet Take 160 mg by mouth daily.    Marland Kitchen gabapentin (NEURONTIN) 100 MG capsule Take 100-200 mg by mouth 2 (two) times daily. Take 1 capsule (100 mg) morning, take 2 capsules (200 mg) at bedtime    . glipiZIDE (GLUCOTROL XL) 10 MG 24 hr tablet Take 10 mg by mouth daily.  11  . hydrALAZINE (APRESOLINE) 25 MG tablet Take 25 mg by mouth 2 (two) times daily.  3  . iron polysaccharides (NIFEREX) 150 MG capsule Take 1 capsule (150 mg total) by mouth 2 (two) times daily. 60 capsule 0  . isosorbide mononitrate (IMDUR) 30 MG 24 hr tablet Take 30 mg by mouth daily.  3  . methimazole (TAPAZOLE) 5 MG tablet Take 1 tablet (5 mg total) by mouth daily. 30 tablet 2  . metoprolol tartrate (LOPRESSOR) 25 MG tablet Take  1 tablet (25 mg total) by mouth 2 (two) times daily. 60 tablet 0  . Multiple Vitamin (MULTIVITAMIN WITH MINERALS) TABS tablet Take 1 tablet by mouth daily.    . OXYGEN Inhale into the lungs.    . pantoprazole (PROTONIX) 40 MG tablet Take 1 tablet (40 mg total) by mouth daily. 30 tablet 0  . potassium chloride SA (K-DUR,KLOR-CON) 20 MEQ tablet Take 1 tablet (20 mEq total) by mouth 2 (two) times daily. 60 tablet 2  . Pramoxine-Menthol (GOLD BOND MAXIMUM RELIEF) 1-1 % CREA Apply 1 application topically 2 (two) times daily as needed (for would care).    . torsemide (DEMADEX) 20 MG tablet Take 1 tablet (20 mg total) by mouth 2 (two) times  daily. 60 tablet 2  . warfarin (COUMADIN) 2.5 MG tablet Take 1 tablet (2.5 mg total) by mouth daily. 10 tablet 0   No current facility-administered medications on file prior to visit.   Past Medical History  Diagnosis Date  . Hypertension   . Neuropathy   . Anxiety   . PONV (postoperative nausea and vomiting)   . CHF (congestive heart failure)   . COPD (chronic obstructive pulmonary disease)     on home, nocturnal oxygen.   . Family history of adverse reaction to anesthesia   . Cardiomyopathy   . Diabetes mellitus without complication     TYPE 2  . Macular degeneration, bilateral   . Varicose veins   . GIB (gastrointestinal bleeding)   . Paroxysmal atrial fibrillation    Past Surgical History  Procedure Laterality Date  . Hip fracture surgery  2004  . Abdominal hysterectomy    . Vein surgery    . Esophagogastroduodenoscopy N/A 12/01/2014    Procedure: ESOPHAGOGASTRODUODENOSCOPY (EGD);  Surgeon: Hilarie Fredrickson, MD;  Location: [facility] ENDOSCOPY;  Service: Endoscopy;  Laterality: N/A;   Family History  Problem Relation Age of Onset  . Colon cancer Father 50    advanced when discovered, no surgery or therapy.  this led to his death  . Breast cancer Sister   . Cancer Brother   . Breast cancer Mother   . Lung cancer Brother    History   Social History  . Marital Status: Widowed    Spouse Name: N/A  . Number of Children: Y  . Years of Education: N/A   Occupational History  . retired    Social History Main Topics  . Smoking status: Passive Smoke Exposure - Never Smoker  . Smokeless tobacco: Never Used  . Alcohol Use: No  . Drug Use: No  . Sexual Activity: Not on file   Other Topics Concern  . None   Social History Narrative   Originally from [place]. She has always lived in [place] & had no travel. She has significant smoke exposure through her husband for 60 years. She has no pets currently. No prior bird or mold exposure. 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        Objective:   Physical Exam Blood pressure 116/60, pulse 68, temperature 97.1 F (36.2 C), temperature source Oral, height 5\' 2"  (1.575 m), weight 154 lb 6.4 oz (70.035 kg), SpO2 97 %. General:  Awake. Alert. No acute distress. Accompanied by daughter today.  Integument:  Warm & dry. No rash on exposed skin. No bruising. Lymphatics:  No appreciated cervical or  supraclavicular lymphadenoapthy. HEENT:  Moist mucus membranes. No oral ulcers. No scleral injection or icterus. PERRL. Cardiovascular:  Regular rate. No edema. No appreciable JVD.  Pulmonary:  Mild bilateral inspiratory crackles. Symmetric chest wall expansion. No accessory muscle use on supplemental oxygen. Abdomen: Soft. Normal bowel sounds. Nondistended. Grossly nontender. Musculoskeletal:  Normal bulk and tone. Hand grip strength 5/5 bilaterally. No joint deformity or effusion appreciated. Neurological:  CN 2-12 grossly in tact. No meningismus. Moving all 4 extremities equally. Symmetric patellar deep tendon reflexes. Psychiatric:  Mood and affect congruent. Speech normal rhythm, rate & tone.   IMAGING CXR PA/LAT 11/29/14 (personally reviewed by me): Cardiomegaly. Blunting of the right costophrenic angle suggestive of a small right pleural effusion. Bilateral interstitial prominence suggestive of interstitial edema. Mediastinum otherwise normal in contour. No focal consolidation or air bronchograms appreciated.  CARDIAC TTE (12/01/14):  LV normal in size.Systolic EF 55-60 percent.Normal wall motion. Mildly enlarged left atrium along with mild mitral regurgitation. RV size and function normal. Mild tricuspid regurgitation and trivial pulmonic regurgitation. Pulmonary artery normal in size with systolic pressure within no pericardial effusion.  TTE (04/13/09):  LV normal in size with moderate  concentric LVH. Systolic EF 60-65%. Normal  Wall motion. LA mildly dilated. RV normal in size and function. No tricuspid regurgitation Pulmonary artery normal in size systolic pressure within normal range. No pericardial effusion.   LABS 12/17/14 BNP: 759.7 INR: 2.7 CBC: 5.0/7.8/25.5/331 BMP: 137/06/97/32/32/2.07/162/8.6    Assessment & Plan:  81 year old female with known history of diastolic congestive heart failure and paroxysmal atrial fibrillation with recent recurrent hospitalization for congestive heart failure. Certainly the patient's history of tobacco exposure through her husband would raise the possibility of underlying COPD with her dyspnea on exertion. However, given the acute onset of her dyspnea which precipitated her recent admission to the hospital. We did briefly review the need for daily weights which the patient is currently performing. We are also going to assess the correct flow of oxygen needed to maintain her saturation while sleeping & with exertion before her next appointment. I instructed the patient and her daughter to contact my office for any further questions or concerns.  1. Possible COPD: Checking for pulmonary function testing without bronchodilator challenge given paroxysmal atrial fibrillation. If this does indicate COPD plan for alpha-1 antitrypsin testing. 2. Hypoxia: Checking 6 minute walk test before next appointment along with overnight pulse oximetry on room air. I have instructed the patient to use her oxygen while sleeping if this is intolerable to perform on room air. 3. History of diastolic congestive heart failure: Patient appears euvolemic today. Continuing on diuretics. Managed by Dr. [name]. 4. Follow-up: Patient to return to clinic in 4-6 weeks or sooner if needed.

## 2014-12-20 ENCOUNTER — Ambulatory Visit: Payer: Medicare Other | Admitting: Cardiology

## 2014-12-21 ENCOUNTER — Encounter (HOSPITAL_COMMUNITY): Payer: Self-pay | Admitting: Family Medicine

## 2014-12-21 ENCOUNTER — Inpatient Hospital Stay (HOSPITAL_COMMUNITY)
Admission: EM | Admit: 2014-12-21 | Discharge: 2014-12-24 | DRG: 291 | Disposition: A | Discharge status: Home Health | Payer: Medicare Other | Attending: Internal Medicine | Admitting: Internal Medicine

## 2014-12-21 ENCOUNTER — Emergency Department (HOSPITAL_COMMUNITY): Payer: Medicare Other

## 2014-12-21 DIAGNOSIS — Z7901 Long term (current) use of anticoagulants: Secondary | ICD-10-CM

## 2014-12-21 DIAGNOSIS — Z7722 Contact with and (suspected) exposure to environmental tobacco smoke (acute) (chronic): Secondary | ICD-10-CM | POA: Diagnosis present

## 2014-12-21 DIAGNOSIS — Z90710 Acquired absence of both cervix and uterus: Secondary | ICD-10-CM | POA: Diagnosis not present

## 2014-12-21 DIAGNOSIS — F419 Anxiety disorder, unspecified: Secondary | ICD-10-CM | POA: Diagnosis present

## 2014-12-21 DIAGNOSIS — J449 Chronic obstructive pulmonary disease, unspecified: Secondary | ICD-10-CM | POA: Diagnosis present

## 2014-12-21 DIAGNOSIS — J90 Pleural effusion, not elsewhere classified: Secondary | ICD-10-CM

## 2014-12-21 DIAGNOSIS — N179 Acute kidney failure, unspecified: Secondary | ICD-10-CM | POA: Diagnosis present

## 2014-12-21 DIAGNOSIS — R0603 Acute respiratory distress: Secondary | ICD-10-CM

## 2014-12-21 DIAGNOSIS — Z9981 Dependence on supplemental oxygen: Secondary | ICD-10-CM | POA: Diagnosis not present

## 2014-12-21 DIAGNOSIS — E1122 Type 2 diabetes mellitus with diabetic chronic kidney disease: Secondary | ICD-10-CM | POA: Diagnosis present

## 2014-12-21 DIAGNOSIS — E0590 Thyrotoxicosis, unspecified without thyrotoxic crisis or storm: Secondary | ICD-10-CM | POA: Diagnosis present

## 2014-12-21 DIAGNOSIS — H3530 Unspecified macular degeneration: Secondary | ICD-10-CM | POA: Diagnosis present

## 2014-12-21 DIAGNOSIS — I5033 Acute on chronic diastolic (congestive) heart failure: Secondary | ICD-10-CM | POA: Diagnosis present

## 2014-12-21 DIAGNOSIS — D509 Iron deficiency anemia, unspecified: Secondary | ICD-10-CM | POA: Diagnosis present

## 2014-12-21 DIAGNOSIS — I509 Heart failure, unspecified: Secondary | ICD-10-CM | POA: Diagnosis not present

## 2014-12-21 DIAGNOSIS — I429 Cardiomyopathy, unspecified: Secondary | ICD-10-CM | POA: Diagnosis present

## 2014-12-21 DIAGNOSIS — I130 Hypertensive heart and chronic kidney disease with heart failure and stage 1 through stage 4 chronic kidney disease, or unspecified chronic kidney disease: Principal | ICD-10-CM | POA: Diagnosis present

## 2014-12-21 DIAGNOSIS — Z888 Allergy status to other drugs, medicaments and biological substances status: Secondary | ICD-10-CM | POA: Diagnosis not present

## 2014-12-21 DIAGNOSIS — I480 Paroxysmal atrial fibrillation: Secondary | ICD-10-CM | POA: Diagnosis present

## 2014-12-21 DIAGNOSIS — J9621 Acute and chronic respiratory failure with hypoxia: Secondary | ICD-10-CM | POA: Diagnosis present

## 2014-12-21 DIAGNOSIS — Z880 Allergy status to penicillin: Secondary | ICD-10-CM | POA: Diagnosis not present

## 2014-12-21 DIAGNOSIS — Z79899 Other long term (current) drug therapy: Secondary | ICD-10-CM | POA: Diagnosis not present

## 2014-12-21 DIAGNOSIS — E1165 Type 2 diabetes mellitus with hyperglycemia: Secondary | ICD-10-CM | POA: Diagnosis present

## 2014-12-21 DIAGNOSIS — IMO0002 Reserved for concepts with insufficient information to code with codable children: Secondary | ICD-10-CM | POA: Diagnosis present

## 2014-12-21 DIAGNOSIS — E1140 Type 2 diabetes mellitus with diabetic neuropathy, unspecified: Secondary | ICD-10-CM | POA: Diagnosis present

## 2014-12-21 DIAGNOSIS — Z885 Allergy status to narcotic agent status: Secondary | ICD-10-CM | POA: Diagnosis not present

## 2014-12-21 DIAGNOSIS — D649 Anemia, unspecified: Secondary | ICD-10-CM | POA: Diagnosis present

## 2014-12-21 DIAGNOSIS — I8390 Asymptomatic varicose veins of unspecified lower extremity: Secondary | ICD-10-CM | POA: Diagnosis present

## 2014-12-21 DIAGNOSIS — Z91048 Other nonmedicinal substance allergy status: Secondary | ICD-10-CM

## 2014-12-21 DIAGNOSIS — R0602 Shortness of breath: Secondary | ICD-10-CM | POA: Diagnosis not present

## 2014-12-21 DIAGNOSIS — N184 Chronic kidney disease, stage 4 (severe): Secondary | ICD-10-CM | POA: Diagnosis present

## 2014-12-21 DIAGNOSIS — J9600 Acute respiratory failure, unspecified whether with hypoxia or hypercapnia: Secondary | ICD-10-CM | POA: Diagnosis present

## 2014-12-21 DIAGNOSIS — J80 Acute respiratory distress syndrome: Secondary | ICD-10-CM | POA: Diagnosis present

## 2014-12-21 DIAGNOSIS — I10 Essential (primary) hypertension: Secondary | ICD-10-CM | POA: Diagnosis present

## 2014-12-21 DIAGNOSIS — J189 Pneumonia, unspecified organism: Secondary | ICD-10-CM

## 2014-12-21 DIAGNOSIS — I5030 Unspecified diastolic (congestive) heart failure: Secondary | ICD-10-CM

## 2014-12-21 DIAGNOSIS — J9601 Acute respiratory failure with hypoxia: Secondary | ICD-10-CM | POA: Diagnosis not present

## 2014-12-21 DIAGNOSIS — J438 Other emphysema: Secondary | ICD-10-CM

## 2014-12-21 LAB — TROPONIN I: Troponin I: <0.03 ng/mL (ref <0.031)

## 2014-12-21 LAB — BASIC METABOLIC PANEL
Anion gap: 11 (ref 5–15)
BUN: 48 mg/dL — AB (ref 6–20)
CHLORIDE: 101 mmol/L (ref 101–111)
CO2: 26 mmol/L (ref 22–32)
Calcium: 8.8 mg/dL — ABNORMAL LOW (ref 8.9–10.3)
Creatinine, Ser: 2.95 mg/dL — ABNORMAL HIGH (ref 0.44–1.00)
GFR calc non Af Amer: 14 mL/min — ABNORMAL LOW (ref >60)
GFR, EST AFRICAN AMERICAN: 16 mL/min — AB (ref >60)
Glucose, Bld: 188 mg/dL — ABNORMAL HIGH (ref 65–99)
Potassium: 5 mmol/L (ref 3.5–5.1)
Sodium: 138 mmol/L (ref 135–145)

## 2014-12-21 LAB — CBC WITH DIFFERENTIAL/PLATELET
Basophils Absolute: 0.1 10*3/uL (ref 0.0–0.1)
Basophils Relative: 1 % (ref 0–1)
EOS ABS: 0.4 10*3/uL (ref 0.0–0.7)
Eosinophils Relative: 4 % (ref 0–5)
HEMATOCRIT: 28.8 % — AB (ref 36.0–46.0)
HEMOGLOBIN: 8.8 g/dL — AB (ref 12.0–15.0)
LYMPHS ABS: 0.8 10*3/uL (ref 0.7–4.0)
LYMPHS PCT: 8 % — AB (ref 12–46)
MCH: 26.7 pg (ref 26.0–34.0)
MCHC: 30.6 g/dL (ref 30.0–36.0)
MCV: 87.5 fL (ref 78.0–100.0)
Monocytes Absolute: 0.5 10*3/uL (ref 0.1–1.0)
Monocytes Relative: 5 % (ref 3–12)
NEUTROS ABS: 8.5 10*3/uL — AB (ref 1.7–7.7)
Neutrophils Relative %: 82 % — ABNORMAL HIGH (ref 43–77)
Platelets: 424 10*3/uL — ABNORMAL HIGH (ref 150–400)
RBC: 3.29 MIL/uL — AB (ref 3.87–5.11)
RDW: 18.2 % — ABNORMAL HIGH (ref 11.5–15.5)
WBC: 10.3 10*3/uL (ref 4.0–10.5)

## 2014-12-21 LAB — MRSA PCR SCREENING: MRSA BY PCR: NEGATIVE

## 2014-12-21 LAB — BLOOD GAS, ARTERIAL
Acid-Base Excess: 3.7 mmol/L — ABNORMAL HIGH (ref 0.0–2.0)
Bicarbonate: 28.8 mEq/L — ABNORMAL HIGH (ref 20.0–24.0)
Drawn by: 227661
FIO2: 0.55
O2 Saturation: 89.8 %
PATIENT TEMPERATURE: 98.6
PO2 ART: 58.8 mmHg — AB (ref 80.0–100.0)
TCO2: 30.4 mmol/L (ref 0–100)
pCO2 arterial: 52.9 mmHg — ABNORMAL HIGH (ref 35.0–45.0)
pH, Arterial: 7.355 (ref 7.350–7.450)

## 2014-12-21 LAB — GLUCOSE, CAPILLARY
GLUCOSE-CAPILLARY: 160 mg/dL — AB (ref 65–99)
GLUCOSE-CAPILLARY: 208 mg/dL — AB (ref 65–99)

## 2014-12-21 LAB — BRAIN NATRIURETIC PEPTIDE: B NATRIURETIC PEPTIDE 5: 557.5 pg/mL — AB (ref 0.0–100.0)

## 2014-12-21 LAB — PROTIME-INR
INR: 2.38 — AB (ref 0.00–1.49)
PROTHROMBIN TIME: 25.7 s — AB (ref 11.6–15.2)

## 2014-12-21 LAB — PROCALCITONIN: Procalcitonin: <0.1 ng/mL

## 2014-12-21 MED ORDER — ATORVASTATIN CALCIUM 20 MG PO TABS
20.0000 mg | ORAL_TABLET | Freq: Every day | ORAL | Status: DC
  Administered 2014-12-21 – 2014-12-23 (×3): 20 mg via ORAL
  Filled 2014-12-21 (×3): qty 1

## 2014-12-21 MED ORDER — ACETAMINOPHEN 325 MG PO TABS: 650.0000 mg | ORAL_TABLET | Freq: Four times a day (QID) | ORAL | Status: DC | PRN

## 2014-12-21 MED ORDER — METHIMAZOLE 5 MG PO TABS
5.0000 mg | ORAL_TABLET | Freq: Two times a day (BID) | ORAL | Status: DC
  Administered 2014-12-21 – 2014-12-22 (×3): 5 mg via ORAL
  Filled 2014-12-21 (×6): qty 1

## 2014-12-21 MED ORDER — ADULT MULTIVITAMIN W/MINERALS CH
1.0000 | ORAL_TABLET | Freq: Every day | ORAL | Status: DC
Start: 2014-12-21 — End: 2014-12-24
  Administered 2014-12-21 – 2014-12-24 (×4): 1 via ORAL
  Filled 2014-12-21 (×4): qty 1

## 2014-12-21 MED ORDER — WARFARIN - PHARMACIST DOSING INPATIENT: Freq: Every day | Status: DC

## 2014-12-21 MED ORDER — VANCOMYCIN HCL 10 G IV SOLR
1250.0000 mg | Freq: Once | INTRAVENOUS | Status: AC
  Administered 2014-12-21: 1250 mg via INTRAVENOUS
  Filled 2014-12-21 (×2): qty 1250

## 2014-12-21 MED ORDER — INSULIN GLARGINE 100 UNIT/ML ~~LOC~~ SOLN
10.0000 [IU] | Freq: Every day | SUBCUTANEOUS | Status: DC
  Administered 2014-12-21 – 2014-12-22 (×2): 10 [IU] via SUBCUTANEOUS
  Filled 2014-12-21 (×2): qty 0.1

## 2014-12-21 MED ORDER — AZTREONAM 1 G IJ SOLR
500.0000 mg | Freq: Three times a day (TID) | INTRAMUSCULAR | Status: DC
  Filled 2014-12-21 (×2): qty 0.5

## 2014-12-21 MED ORDER — WARFARIN SODIUM 2.5 MG PO TABS
2.5000 mg | ORAL_TABLET | Freq: Once | ORAL | Status: AC
  Administered 2014-12-21: 2.5 mg via ORAL
  Filled 2014-12-21 (×2): qty 1

## 2014-12-21 MED ORDER — POLYSACCHARIDE IRON COMPLEX 150 MG PO CAPS
150.0000 mg | ORAL_CAPSULE | Freq: Two times a day (BID) | ORAL | Status: DC
  Administered 2014-12-21 – 2014-12-24 (×7): 150 mg via ORAL
  Filled 2014-12-21 (×7): qty 1

## 2014-12-21 MED ORDER — FUROSEMIDE 10 MG/ML IJ SOLN
80.0000 mg | Freq: Two times a day (BID) | INTRAMUSCULAR | Status: DC
  Administered 2014-12-21 – 2014-12-22 (×3): 80 mg via INTRAVENOUS
  Filled 2014-12-21 (×3): qty 8

## 2014-12-21 MED ORDER — ISOSORBIDE MONONITRATE ER 30 MG PO TB24
30.0000 mg | ORAL_TABLET | Freq: Every day | ORAL | Status: DC
  Administered 2014-12-21 – 2014-12-24 (×4): 30 mg via ORAL
  Filled 2014-12-21 (×4): qty 1

## 2014-12-21 MED ORDER — IPRATROPIUM-ALBUTEROL 0.5-2.5 (3) MG/3ML IN SOLN
3.0000 mL | Freq: Four times a day (QID) | RESPIRATORY_TRACT | Status: DC
  Administered 2014-12-21: 3 mL via RESPIRATORY_TRACT

## 2014-12-21 MED ORDER — ALBUTEROL SULFATE (2.5 MG/3ML) 0.083% IN NEBU: 2.5000 mg | INHALATION_SOLUTION | RESPIRATORY_TRACT | Status: DC | PRN

## 2014-12-21 MED ORDER — DEXTROSE 5 % IV SOLN
2.0000 g | Freq: Once | INTRAVENOUS | Status: AC
  Administered 2014-12-21: 2 g via INTRAVENOUS
  Filled 2014-12-21 (×2): qty 2

## 2014-12-21 MED ORDER — IPRATROPIUM-ALBUTEROL 0.5-2.5 (3) MG/3ML IN SOLN
3.0000 mL | Freq: Once | RESPIRATORY_TRACT | Status: AC
  Administered 2014-12-21: 3 mL via RESPIRATORY_TRACT
  Filled 2014-12-21: qty 3

## 2014-12-21 MED ORDER — PANTOPRAZOLE SODIUM 40 MG PO TBEC
40.0000 mg | DELAYED_RELEASE_TABLET | Freq: Every day | ORAL | Status: DC
  Administered 2014-12-21 – 2014-12-24 (×4): 40 mg via ORAL
  Filled 2014-12-21 (×4): qty 1

## 2014-12-21 MED ORDER — ERYTHROMYCIN 5 MG/GM OP OINT
TOPICAL_OINTMENT | Freq: Four times a day (QID) | OPHTHALMIC | Status: AC
  Filled 2014-12-21 (×2): qty 3.5

## 2014-12-21 MED ORDER — FUROSEMIDE 10 MG/ML IJ SOLN
80.0000 mg | Freq: Once | INTRAMUSCULAR | Status: AC
  Administered 2014-12-21: 80 mg via INTRAVENOUS
  Filled 2014-12-21: qty 8

## 2014-12-21 MED ORDER — LEVALBUTEROL HCL 0.63 MG/3ML IN NEBU
0.6300 mg | INHALATION_SOLUTION | RESPIRATORY_TRACT | Status: DC
  Administered 2014-12-21 – 2014-12-22 (×5): 0.63 mg via RESPIRATORY_TRACT
  Filled 2014-12-21 (×5): qty 3

## 2014-12-21 MED ORDER — GABAPENTIN 100 MG PO CAPS: 100.0000 mg | ORAL_CAPSULE | Freq: Two times a day (BID) | ORAL | Status: DC

## 2014-12-21 MED ORDER — ESCITALOPRAM OXALATE 10 MG PO TABS
10.0000 mg | ORAL_TABLET | Freq: Every day | ORAL | Status: DC
  Administered 2014-12-21 – 2014-12-24 (×4): 10 mg via ORAL
  Filled 2014-12-21 (×4): qty 1

## 2014-12-21 MED ORDER — HYDRALAZINE HCL 25 MG PO TABS
25.0000 mg | ORAL_TABLET | Freq: Two times a day (BID) | ORAL | Status: DC
  Administered 2014-12-21 – 2014-12-24 (×7): 25 mg via ORAL
  Filled 2014-12-21 (×7): qty 1

## 2014-12-21 MED ORDER — POTASSIUM CHLORIDE CRYS ER 20 MEQ PO TBCR
20.0000 meq | EXTENDED_RELEASE_TABLET | Freq: Two times a day (BID) | ORAL | Status: DC
  Administered 2014-12-21 – 2014-12-23 (×6): 20 meq via ORAL
  Filled 2014-12-21 (×7): qty 1

## 2014-12-21 MED ORDER — VITAMIN D 1000 UNITS PO TABS
1000.0000 [IU] | ORAL_TABLET | Freq: Every day | ORAL | Status: DC
  Administered 2014-12-21 – 2014-12-23 (×3): 1000 [IU] via ORAL
  Filled 2014-12-21 (×3): qty 1

## 2014-12-21 MED ORDER — VANCOMYCIN HCL IN DEXTROSE 1-5 GM/200ML-% IV SOLN: 1000.0000 mg | INTRAVENOUS | Status: DC

## 2014-12-21 MED ORDER — METHYLPREDNISOLONE SODIUM SUCC 125 MG IJ SOLR
125.0000 mg | Freq: Once | INTRAMUSCULAR | Status: AC
  Administered 2014-12-21: 125 mg via INTRAVENOUS
  Filled 2014-12-21: qty 2

## 2014-12-21 MED ORDER — FENOFIBRATE 160 MG PO TABS
160.0000 mg | ORAL_TABLET | Freq: Every day | ORAL | Status: DC
  Administered 2014-12-21 – 2014-12-24 (×4): 160 mg via ORAL
  Filled 2014-12-21 (×4): qty 1

## 2014-12-21 MED ORDER — SODIUM CHLORIDE 0.9 % IJ SOLN
3.0000 mL | Freq: Two times a day (BID) | INTRAMUSCULAR | Status: DC
  Administered 2014-12-21 – 2014-12-24 (×6): 3 mL via INTRAVENOUS

## 2014-12-21 MED ORDER — METOPROLOL TARTRATE 25 MG PO TABS
25.0000 mg | ORAL_TABLET | Freq: Two times a day (BID) | ORAL | Status: DC
  Administered 2014-12-21 – 2014-12-24 (×7): 25 mg via ORAL
  Filled 2014-12-21 (×7): qty 1

## 2014-12-21 MED ORDER — CHLORHEXIDINE GLUCONATE 0.12 % MT SOLN
15.0000 mL | Freq: Two times a day (BID) | OROMUCOSAL | Status: DC
  Administered 2014-12-21 – 2014-12-24 (×6): 15 mL via OROMUCOSAL
  Filled 2014-12-21 (×4): qty 15

## 2014-12-21 MED ORDER — GABAPENTIN 100 MG PO CAPS
200.0000 mg | ORAL_CAPSULE | Freq: Every day | ORAL | Status: DC
  Administered 2014-12-21 – 2014-12-23 (×3): 200 mg via ORAL
  Filled 2014-12-21 (×3): qty 2

## 2014-12-21 MED ORDER — INSULIN ASPART 100 UNIT/ML ~~LOC~~ SOLN
0.0000 [IU] | Freq: Three times a day (TID) | SUBCUTANEOUS | Status: DC
  Administered 2014-12-21: 3 [IU] via SUBCUTANEOUS
  Administered 2014-12-22 (×2): 11 [IU] via SUBCUTANEOUS
  Administered 2014-12-22: 5 [IU] via SUBCUTANEOUS

## 2014-12-21 MED ORDER — GABAPENTIN 100 MG PO CAPS
100.0000 mg | ORAL_CAPSULE | Freq: Every morning | ORAL | Status: DC
  Administered 2014-12-22 – 2014-12-24 (×3): 100 mg via ORAL
  Filled 2014-12-21 (×3): qty 1

## 2014-12-21 MED ORDER — INSULIN ASPART 100 UNIT/ML ~~LOC~~ SOLN
0.0000 [IU] | Freq: Every day | SUBCUTANEOUS | Status: DC
  Administered 2014-12-22: 4 [IU] via SUBCUTANEOUS

## 2014-12-21 NOTE — Progress Notes (Signed)
Pt became nauseated and was having respiratory distress while head of the bed was laid back to change sheets. Suction put at bedside, BIPAP on, sating 93%, IV solumedrol and lasix administered. Pt resting comfortably

## 2014-12-21 NOTE — ED Notes (Signed)
Pt here for SOB that started this am. sts recently admitted to the hospital for the same. Pt labored breathing. sats 70% on 2 L. Pt on home O2.

## 2014-12-21 NOTE — ED Notes (Signed)
Pt using bedpan. 

## 2014-12-21 NOTE — Consult Note (Cosign Needed)
CARDIOLOGY CONSULT NOTE  Patient ID: Alexis [name], MRN: [id number], DOB/AGE: 1932-03-09 81 y.o. Admit date: 12/21/2014 Date of Consult: 12/21/2014  Primary Physician: Vicenta Aly, [place] Primary Cardiologist: Dr Debara Pickett Referring Physician: Dr Heber Many  Chief Complaint: Shortness of Breath Reason for Consultation: CHF  HPI: This is an 81 year old woman with diastolic heart failure. She presented today with acute shortness of breath and is felt to again be in heart failure. This is her third hospital admission this month. She was last discharged 4 days ago. She has just seen her pulmonologist and cardiologist within the last few weeks.  At the time of my interview, there are multiple family members at the bedside. The patient is on BiPAP and is unable to provide a history. She is awake and alert. Last night when she went to bed she was in her normal state of health. This morning after awaking she developed acute shortness of breath and EMS was called. She had no chest pain or pressure associated with this. She denies edema, orthopnea, or PND. She has been recently discharged on home oxygen. The patient and family report good compliance with her medications. She has not been eating salt. She has been wearing oxygen.  The patient was last seen by Dr. Debara Pickett on July 13. I have reviewed his comprehensive note. She has undergone nuclear stress testing demonstrating vigorous left ventricular function and no ischemia. Her LVEF was greater than 70%. She was noted to have paroxysmal atrial fibrillation and was started on warfarin because of a CHADS-Vasc of 6.  Medical History:  Past Medical History  Diagnosis Date  . Hypertension   . Neuropathy   . Anxiety   . PONV (postoperative nausea and vomiting)   . CHF (congestive heart failure)   . COPD (chronic obstructive pulmonary disease)     on home, nocturnal oxygen.   . Family history of adverse reaction to anesthesia   . Cardiomyopathy   .  Diabetes mellitus without complication     TYPE 2  . Macular degeneration, bilateral   . Varicose veins   . GIB (gastrointestinal bleeding)   . Paroxysmal atrial fibrillation   . Shortness of breath dyspnea       Surgical History:  Past Surgical History  Procedure Laterality Date  . Hip fracture surgery  2004  . Abdominal hysterectomy    . Vein surgery    . Esophagogastroduodenoscopy N/A 12/01/2014    Procedure: ESOPHAGOGASTRODUODENOSCOPY (EGD);  Surgeon: Irene Shipper, MD;  [facility] ENDOSCOPY;  Service: Endoscopy;  Laterality: N/A;     Home Meds: Prior to Admission medications   Medication Sig Start Date End Date Taking? Authorizing Provider  acetaminophen (TYLENOL) 325 MG tablet Take 650 mg by mouth every 6 (six) hours as needed for mild pain.    Yes Historical Provider, MD  albuterol (PROVENTIL HFA;VENTOLIN HFA) 108 (90 BASE) MCG/ACT inhaler Inhale 2 puffs into the lungs every 6 (six) hours as needed for wheezing or shortness of breath. 06/11/13  Yes Nishant Dhungel, MD  atorvastatin (LIPITOR) 20 MG tablet Take 20 mg by mouth at bedtime. 10/12/14 10/12/15 Yes Historical Provider, MD  cholecalciferol (VITAMIN D) 1000 UNITS tablet Take 1,000 Units by mouth at bedtime.    Yes Historical Provider, MD  erythromycin ophthalmic ointment Place into the right eye every 6 (six) hours. Use 4 times daily until 12/21/14, then STOP. DO NOT USE UNTIL EMPTY. 12/17/14  Yes Corky Sox, MD  escitalopram (LEXAPRO) 10 MG tablet Take 10  mg by mouth daily.   Yes Historical Provider, MD  fenofibrate 160 MG tablet Take 160 mg by mouth daily.   Yes Historical Provider, MD  gabapentin (NEURONTIN) 100 MG capsule Take 100-200 mg by mouth 2 (two) times daily. Take 1 capsule (100 mg) morning, take 2 capsules (200 mg) at bedtime   Yes Historical Provider, MD  glipiZIDE (GLUCOTROL XL) 10 MG 24 hr tablet Take 10 mg by mouth daily. 10/11/14  Yes Historical Provider, MD  hydrALAZINE (APRESOLINE) 25 MG tablet Take 25 mg  by mouth 2 (two) times daily. 12/05/14  Yes Historical Provider, MD  iron polysaccharides (NIFEREX) 150 MG capsule Take 1 capsule (150 mg total) by mouth 2 (two) times daily. 12/02/14  Yes Milagros Loll, MD  isosorbide mononitrate (IMDUR) 30 MG 24 hr tablet Take 30 mg by mouth daily. 12/05/14  Yes Historical Provider, MD  methimazole (TAPAZOLE) 5 MG tablet Take 1 tablet (5 mg total) by mouth daily. Patient taking differently: Take 5 mg by mouth 2 (two) times daily.  12/17/14  Yes Corky Sox, MD  metoprolol tartrate (LOPRESSOR) 25 MG tablet Take 1 tablet (25 mg total) by mouth 2 (two) times daily. 12/02/14  Yes Milagros Loll, MD  Multiple Vitamin (MULTIVITAMIN WITH MINERALS) TABS tablet Take 1 tablet by mouth daily.   Yes Historical Provider, MD  OXYGEN Inhale into the lungs.   Yes Historical Provider, MD  pantoprazole (PROTONIX) 40 MG tablet Take 1 tablet (40 mg total) by mouth daily. 12/02/14  Yes Milagros Loll, MD  potassium chloride SA (K-DUR,KLOR-CON) 20 MEQ tablet Take 1 tablet (20 mEq total) by mouth 2 (two) times daily. 12/17/14  Yes Corky Sox, MD  Pramoxine-Menthol (GOLD BOND MAXIMUM RELIEF) 1-1 % CREA Apply 1 application topically 2 (two) times daily as needed (for would care).   Yes Historical Provider, MD  torsemide (DEMADEX) 20 MG tablet Take 1 tablet (20 mg total) by mouth 2 (two) times daily. 12/17/14  Yes Corky Sox, MD  warfarin (COUMADIN) 2.5 MG tablet Take 1 tablet (2.5 mg total) by mouth daily. 12/18/14  Yes Corky Sox, MD    Inpatient Medications:  . atorvastatin  20 mg Oral QHS  . cholecalciferol  1,000 Units Oral QHS  . erythromycin   Right Eye 4 times per day  . escitalopram  10 mg Oral Daily  . fenofibrate  160 mg Oral Daily  . furosemide  80 mg Intravenous BID  . [START ON 12/22/2014] gabapentin  100 mg Oral q morning - 10a   And  . gabapentin  200 mg Oral QHS  . hydrALAZINE  25 mg Oral BID  . insulin aspart  0-15 Units Subcutaneous TID WC  . insulin aspart   0-5 Units Subcutaneous QHS  . insulin glargine  10 Units Subcutaneous QHS  . iron polysaccharides  150 mg Oral BID  . isosorbide mononitrate  30 mg Oral Daily  . levalbuterol  0.63 mg Nebulization Q4H  . methimazole  5 mg Oral BID  . metoprolol tartrate  25 mg Oral BID  . multivitamin with minerals  1 tablet Oral Daily  . pantoprazole  40 mg Oral Daily  . potassium chloride SA  20 mEq Oral BID  . sodium chloride  3 mL Intravenous Q12H  . warfarin  2.5 mg Oral ONCE-1800  . Warfarin - Pharmacist Dosing Inpatient   Does not apply q1800      Allergies:  Allergies  Allergen Reactions  .  Other     novocaine broke mouth out  . Codeine Hives  . Penicillins Hives  . Procaine Hives  . Yellow Dyes (Non-Tartrazine) Nausea And Vomiting    "deathly allergic" No other information given to explain reaction.    History   Social History  . Marital Status: Widowed    Spouse Name: N/A  . Number of Children: Y  . Years of Education: N/A   Occupational History  . retired    Social History Main Topics  . Smoking status: Passive Smoke Exposure - Never Smoker  . Smokeless tobacco: Never Used  . Alcohol Use: No  . Drug Use: No  . Sexual Activity: Not on file   Other Topics Concern  . Not on file   Social History Narrative   Originally from [place]. She has always lived in [place] & had no travel. She has significant smoke exposure through her husband for 60 years. She has no pets currently. No prior bird or mold exposure. She did work outside of the home [employer] medical [employer]. 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      Family History  Problem Relation Age of Onset  . Colon cancer Father 58    advanced when discovered, no surgery or therapy.  this led to his death  . Breast cancer Sister   . Cancer Brother   . Breast cancer Mother   . Lung cancer Brother      Review of Systems: General: negative  for chills, fever, night sweats or weight changes.  ENT: negative for rhinorrhea or epistaxis Cardiovascular: negative for chest pain, palpitations, or paroxysmal nocturnal dyspnea Dermatological: negative for rash Respiratory: Positive shortness of breath GI: Positive for dark stools on iron replacement GU: no hematuria, urgency, or frequency Neurologic: negative for visual changes, syncope, headache, or dizziness Heme: Positive for easy bruising Endo: negative for excessive thirst, thyroid disorder, or flushing Musculoskeletal: negative for joint pain or swelling, negative for myalgias  All other systems reviewed and are otherwise negative except as noted above.  Physical Exam: Blood pressure 168/65, pulse 93, temperature 99 F (37.2 C), temperature source Oral, resp. rate 28, SpO2 95 %. Pt is alert and oriented, elderly patient, appears comfortable on BiPAP HEENT: normal Neck: JVP moderately elevated. Carotid upstrokes normal without bruits. No thyromegaly. Lungs: equal expansion, coarse breath sounds bilaterally CV: Apex is discrete and nondisplaced, RRR grade 2/6 systolic ejection murmur along the left sternal border Abd: soft, NT, +BS, no bruit, no hepatosplenomegaly Back: no CVA tenderness Ext: no C/C/E Skin: warm and dry without rash Neuro: CNII-XII intact             Strength intact = bilaterally    Labs:  Recent Labs  12/21/14 0745  TROPONINI <0.03   Lab Results  Component Value Date   WBC 10.3 12/21/2014   HGB 8.8* 12/21/2014   HCT 28.8* 12/21/2014   MCV 87.5 12/21/2014   PLT 424* 12/21/2014    Recent Labs Lab 12/21/14 0745  NA 138  K 5.0  CL 101  CO2 26  BUN 48*  CREATININE 2.95*  CALCIUM 8.8*  GLUCOSE 188*   Lab Results  Component Value Date   CHOL 126 12/14/2014   HDL 40* 12/14/2014   LDLCALC 72 12/14/2014   TRIG 71 12/14/2014   No results found for: DDIMER  Radiology/Studies:  Dg Chest 2 View  11/29/2014   CLINICAL DATA:  Shortness of  breath.  EXAM: CHEST  2 VIEW  COMPARISON:  11/03/2014 .  FINDINGS: Cardiomegaly with pulmonary vascular prominence and diffuse interstitial prominence with small right pleural effusion. These findings consistent with congestive heart failure .  IMPRESSION: Congestive heart failure with pulmonary interstitial edema and small right pleural effusion. Pulmonary interstitial edema has progressed from prior study of 11/03/2014.   Electronically Signed   By: Marcello Moores  Register   On: 11/29/2014 14:50   Dg Chest Portable 1 View  12/21/2014   CLINICAL DATA:  Acute shortness of breath.  EXAM: PORTABLE CHEST - 1 VIEW  COMPARISON:  December 13, 2014.  FINDINGS: Stable cardiomegaly. No pneumothorax is noted. Minimal right pleural effusion is noted. Bilateral perihilar and basilar opacities are noted concerning for edema or pneumonia. Bony thorax is intact.  IMPRESSION: Bilateral perihilar and basilar opacities are noted concerning for edema or pneumonia. Minimal right pleural effusion is noted.   Electronically Signed   By: Marijo Conception, M.D.   On: 12/21/2014 08:06   Dg Chest Portable 1 View  12/13/2014   CLINICAL DATA:  Worsening shortness of breath, chest tightness  EXAM: PORTABLE CHEST - 1 VIEW  COMPARISON:  11/29/2014  FINDINGS: Patchy airspace opacities throughout both lungs involving bases more than apices, increased since previous exam. There is blunting of the right lateral costophrenic angle suggesting small effusion. Mild cardiomegaly stable. Multiple old left rib fractures. No pneumothorax.  IMPRESSION: 1. Progressive bilateral edema/infiltrates with probable small right effusion.   Electronically Signed   By: Lucrezia Europe M.D.   On: 12/13/2014 20:32    EKG: Normal sinus rhythm 84 bpm, nonspecific ST change  Cardiac Studies: Echo 12/01/2014: Study Conclusions  - Left ventricle: The cavity size was normal. Systolic function was normal. The estimated ejection fraction was in the range of 55% to 60%. Wall  motion was normal; there were no regional wall motion abnormalities. - Aortic valve: Valve area (VTI): 1.78 cm^2. Valve area (Vmax): 1.82 cm^2. Valve area (Vmean): 1.64 cm^2. - Mitral valve: There was mild regurgitation. Valve area by continuity equation (using LVOT flow): 1.79 cm^2. - Left atrium: The atrium was mildly dilated.  ASSESSMENT AND PLAN:  1. Acute on chronic respiratory failure, suspect primarily related to congestive heart failure/pulmonary edema  2. Acute on chronic diastolic heart failure. It is difficult to find an exact trigger. She has been evaluated for ischemic heart disease with a normal Myoview scan. She does not have significant valvular heart disease. She reports compliance with both her diuretics and sodium restriction. She appears to have an extremely narrow therapeutic window with respect to her volume status.  3. Acute on chronic kidney injury, creatinine now up to 3.0, eGFR < 20  4. Malignant hypertension with congestive heart failure  For now I agree with the use of IV furosemide for diuresis. She appears to be improving and respiratory is going to attempt to transition her off of BiPAP. Her troponins are all normal and I do not think there is a high likelihood of ischemic heart disease considering her recent nuclear stress scan demonstrating normal myocardial perfusion. The patient appears to have very severe diastolic heart failure with a narrow treatment window for diuretic therapy. Torsemide at higher doses may be required and prn use of metolazone may be necessary as well. I am concerned about her progressive renal dysfunction and this will be carefully monitored. Will follow-up in the am to assess her clinical response to IV lasix. Will continue to  titrate other cardiac medications as tolerated to optimize afterload reduction and BP control. Medical program is carefully reviewed today. She is not a candidate for ACE or ARB in the setting of advanced renal  disease.  SignedSherren Mocha MD, [facility] 12/21/2014, 5:22 PM

## 2014-12-21 NOTE — H&P (Signed)
Date: 12/21/2014               Patient Name:  Alexis [name] MRN: [id number]  DOB: 17-Dec-1931 Age / Sex: 81 y.o., female   PCP: Elizabeth Palau, FNP         Medical Service: Internal Medicine Teaching Service         Attending Physician: Dr. Earl Lagos, MD    First Contact: Dr. Isabella Bowens Pager: [phone]  Second Contact: Dr. Yetta Barre Pager: [phone]       After Hours (After 5p/  First Contact Pager: [phone]  weekends / holidays): Second Contact Pager: [phone]   Chief Complaint: dyspnea  History of Present Illness: Alexis [name] is a 81 yo F with PMH of dCHF, paroxsymal Afib, HTN, COPD on chronic home O2, and DM who returns to the ED today with complaints of increasing dyspnea.  She was discharged from the hospital on Sunday the 23rd after treatment for acute CHF exacerbation.  On Monday she went to see Dr Jamison Neighbor (Pulmonology) for further evaluation of her hypoxia.  At that visit she was still on 4L of supplemental O2 via [place] with O2 sat of 97% and her hypoxia was felt to be mostly related to heart failure and PAF but PFTs were ordered to further evaluate her COPD.  She reports that this morning she felt that she was more short of breath especially with any activity or lying flat.  She does report compliance with all of her medications including torsemide.  She denies any fever/chills.  She has a very mild non productive cough.  Meds: Current Facility-Administered Medications  Medication Dose Route Frequency Provider Last Rate Last Dose  . aztreonam (AZACTAM) 2 g in dextrose 5 % 50 mL IVPB  2 g Intravenous Once Blane Ohara, MD      . vancomycin (VANCOCIN) 1,250 mg in sodium chloride 0.9 % 250 mL IVPB  1,250 mg Intravenous Once Thuy D Dang, RPH      . [START ON 12/23/2014] vancomycin (VANCOCIN) IVPB 1000 mg/200 mL premix  1,000 mg Intravenous Q48H Gerilyn Nestle, [facility]       Current Outpatient Prescriptions  Medication Sig Dispense Refill  . acetaminophen (TYLENOL) 325 MG tablet  Take 650 mg by mouth every 6 (six) hours as needed for mild pain.     Marland Kitchen albuterol (PROVENTIL HFA;VENTOLIN HFA) 108 (90 BASE) MCG/ACT inhaler Inhale 2 puffs into the lungs every 6 (six) hours as needed for wheezing or shortness of breath. 1 Inhaler 3  . atorvastatin (LIPITOR) 20 MG tablet Take 20 mg by mouth at bedtime.    . cholecalciferol (VITAMIN D) 1000 UNITS tablet Take 1,000 Units by mouth at bedtime.     Marland Kitchen erythromycin ophthalmic ointment Place into the right eye every 6 (six) hours. Use 4 times daily until 12/21/14, then STOP. DO NOT USE UNTIL EMPTY. 3.5 g 0  . escitalopram (LEXAPRO) 10 MG tablet Take 10 mg by mouth daily.    . fenofibrate 160 MG tablet Take 160 mg by mouth daily.    Marland Kitchen gabapentin (NEURONTIN) 100 MG capsule Take 100-200 mg by mouth 2 (two) times daily. Take 1 capsule (100 mg) morning, take 2 capsules (200 mg) at bedtime    . glipiZIDE (GLUCOTROL XL) 10 MG 24 hr tablet Take 10 mg by mouth daily.  11  . hydrALAZINE (APRESOLINE) 25 MG tablet Take 25 mg by mouth 2 (two) times daily.  3  . iron polysaccharides (NIFEREX) 150 MG capsule  Take 1 capsule (150 mg total) by mouth 2 (two) times daily. 60 capsule 0  . isosorbide mononitrate (IMDUR) 30 MG 24 hr tablet Take 30 mg by mouth daily.  3  . methimazole (TAPAZOLE) 5 MG tablet Take 1 tablet (5 mg total) by mouth daily. (Patient taking differently: Take 5 mg by mouth 2 (two) times daily. ) 30 tablet 2  . metoprolol tartrate (LOPRESSOR) 25 MG tablet Take 1 tablet (25 mg total) by mouth 2 (two) times daily. 60 tablet 0  . Multiple Vitamin (MULTIVITAMIN WITH MINERALS) TABS tablet Take 1 tablet by mouth daily.    . OXYGEN Inhale into the lungs.    . pantoprazole (PROTONIX) 40 MG tablet Take 1 tablet (40 mg total) by mouth daily. 30 tablet 0  . potassium chloride SA (K-DUR,KLOR-CON) 20 MEQ tablet Take 1 tablet (20 mEq total) by mouth 2 (two) times daily. 60 tablet 2  . Pramoxine-Menthol (GOLD BOND MAXIMUM RELIEF) 1-1 % CREA Apply 1  application topically 2 (two) times daily as needed (for would care).    . torsemide (DEMADEX) 20 MG tablet Take 1 tablet (20 mg total) by mouth 2 (two) times daily. 60 tablet 2  . warfarin (COUMADIN) 2.5 MG tablet Take 1 tablet (2.5 mg total) by mouth daily. 10 tablet 0    Allergies: Allergies as of 12/21/2014 - Review Complete 12/21/2014  Allergen Reaction Noted  . Other  06/09/2013  . Codeine Hives 06/09/2013  . Penicillins Hives 06/09/2013  . Procaine Hives 07/15/2013  . Yellow dyes (non-tartrazine) Nausea And Vomiting 07/21/2013   Past Medical History  Diagnosis Date  . Hypertension   . Neuropathy   . Anxiety   . PONV (postoperative nausea and vomiting)   . CHF (congestive heart failure)   . COPD (chronic obstructive pulmonary disease)     on home, nocturnal oxygen.   . Family history of adverse reaction to anesthesia   . Cardiomyopathy   . Diabetes mellitus without complication     TYPE 2  . Macular degeneration, bilateral   . Varicose veins   . GIB (gastrointestinal bleeding)   . Paroxysmal atrial fibrillation    Past Surgical History  Procedure Laterality Date  . Hip fracture surgery  2004  . Abdominal hysterectomy    . Vein surgery    . Esophagogastroduodenoscopy N/A 12/01/2014    Procedure: ESOPHAGOGASTRODUODENOSCOPY (EGD);  Surgeon: Hilarie Fredrickson, MD;  Location: [facility] ENDOSCOPY;  Service: Endoscopy;  Laterality: N/A;   Family History  Problem Relation Age of Onset  . Colon cancer Father 16    advanced when discovered, no surgery or therapy.  this led to his death  . Breast cancer Sister   . Cancer Brother   . Breast cancer Mother   . Lung cancer Brother    History   Social History  . Marital Status: Widowed    Spouse Name: N/A  . Number of Children: Y  . Years of Education: N/A   Occupational History  . retired    Social History Main Topics  . Smoking status: Passive Smoke Exposure - Never Smoker  . Smokeless tobacco: Never Used  . Alcohol Use: No    . Drug Use: No  . Sexual Activity: Not on file   Other Topics Concern  . Not on file   Social History Narrative   Originally from [place]. She has always lived in [place] & had no travel. She has significant smoke exposure through her husband for 60 years. She  has no pets currently. No prior bird or mold exposure. 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     Review of Systems: Review of Systems  Constitutional: Negative for fever, chills and malaise/fatigue.  Eyes: Negative for blurred vision.  Respiratory: Positive for shortness of breath. Negative for cough, hemoptysis, sputum production and wheezing.   Cardiovascular: Positive for leg swelling. Negative for chest pain and orthopnea.  Gastrointestinal: Negative for abdominal pain, blood in stool and melena.  Genitourinary: Negative for dysuria.  Neurological: Negative for dizziness and headaches.  Psychiatric/Behavioral: Negative for depression.     Physical Exam: Blood pressure 181/73, pulse 82, temperature 98.5 F (36.9 C), temperature source Oral, resp. rate 28, SpO2 91 %. Physical Exam  Constitutional: She is oriented to person, place, and time and well-developed, well-nourished, and in no distress.  On 4L via [place], O2 sat 92%  HENT:  Head: Normocephalic and atraumatic.  Eyes: Conjunctivae are normal.  Cardiovascular: Normal rate, regular rhythm and normal heart sounds.   No murmur heard. Pulmonary/Chest: Effort normal. No respiratory distress. She has no wheezes. She has rales (bibasilar).  Abdominal: Soft. Bowel sounds are normal. She exhibits no distension. There is no tenderness.  Musculoskeletal: She exhibits edema (1+ bilateral pedal edema).  Neurological: She is alert and oriented to person, place, and time.  Nursing note and vitals reviewed.    Lab results: Basic Metabolic  Panel:  Recent Labs  12/21/14 0745  NA 138  K 5.0  CL 101  CO2 26  GLUCOSE 188*  BUN 48*  CREATININE 2.95*  CALCIUM 8.8*   CBC:  Recent Labs  12/21/14 0745  WBC 10.3  NEUTROABS 8.5*  HGB 8.8*  HCT 28.8*  MCV 87.5  PLT 424*   Cardiac Enzymes:  Recent Labs  12/21/14 0745  TROPONINI <0.03   BNP:557  Coagulation:  Recent Labs  12/19/14 1222 12/21/14 0914  LABPROT  --  25.7*  INR 2.2 2.38*     Imaging results:  Dg Chest Portable 1 View  12/21/2014   CLINICAL DATA:  Acute shortness of breath.  EXAM: PORTABLE CHEST - 1 VIEW  COMPARISON:  December 13, 2014.  FINDINGS: Stable cardiomegaly. No pneumothorax is noted. Minimal right pleural effusion is noted. Bilateral perihilar and basilar opacities are noted concerning for edema or pneumonia. Bony thorax is intact.  IMPRESSION: Bilateral perihilar and basilar opacities are noted concerning for edema or pneumonia. Minimal right pleural effusion is noted.   Electronically Signed   By: Lupita Raider, M.D.   On: 12/21/2014 08:06    Other results: EKG: normal EKG, normal sinus rhythm, 1st degree AV block. Otherwise unchanged  Assessment & Plan by Problem: Principal Problem:   Acute on chronic respiratory failure with hypoxia due to acute on chronic (HFpEF) heart failure with preserved ejection fraction - Patient presents for recurrent admission again with hypoxic respiratory failure.  On exam she does have some bibasilar rales, and lower extremity edema.  Her weight is up only 2 lbs but this is near the weight she presented at last admission, her BNP is elevated at 555 but is lower than the last admission.  I am concern that her primary issue this admission is mild hypervolemia on top of other pulmonary issues.  So I will start treatment with some IV diuresis.  However the possibility of a superimposed PNA  has been brought up by the ED providers, I do not see much evidence of this on exam or by portable Xray but at this point  it would not be unreasonable to continue empiric IV antibiotics especially since there was concern initially for hypothermia.  I do not see much evidence for an acute COPD exacerbation.  She is currently theraputic on anticoagulation and does not have significant risk factors for DVT/PE. - Admit to telemetry - IV lasix 80mg  BID - Continue Vanc and Aztreonam for now, will check a procalcintonin to assist with decision to d/c antibiotics.  Can also hopefully repeat a 2 view CXR in the AM.  Will follow up blood cultures. - Duoneb treatments scheduled q6    Hypertension - Continue home hydralazine, imdur and metoprolol.    Type 2 diabetes mellitus, uncontrolled - Hold home glipizide - Lantus 10 u QHS - SSI-M    COPD (chronic obstructive pulmonary disease) - This has not been fully classified, she did recently see Dr Jamison Neighbor who ordered PFTS. - No wheezing on exam today, will treat for now with scheduled duonebs q6h    Normocytic anemia - 2/2 previous blood loss. Denies current melna or bloody stools.  Has been started on supplemental iron.  EGD previously did not show cause, felt to high risk for colonoscopy. - Monitor with intermittent CBC, transfuse as needed.    Paroxysmal atrial fibrillation -Currently in NSR.  On warfarin at [facility] for A/C  Hyperthyroidism -Continue home methimazole  Dispo: Disposition is deferred at this time, awaiting improvement of current medical problems. Anticipated discharge in approximately 2 day(s).   The patient does have a current PCP Elizabeth Palau, FNP) and does not need an [facility] hospital follow-up appointment after discharge.  The patient does not have transportation limitations that hinder transportation to clinic appointments.  Signed: Gust Rung, DO 12/21/2014, 9:49 AM

## 2014-12-21 NOTE — ED Provider Notes (Signed)
CSN: [id number]     Arrival date & time 12/21/14  0709 History   First MD Initiated Contact with Patient 12/21/14 [phone]     Chief Complaint  Patient presents with  . Shortness of Breath     (Consider location/radiation/quality/duration/timing/severity/associated sxs/prior Treatment) HPI Comments: 81 year old female with history of diastolic heart failure, respiratory failure, kidney injury, diabetes, COPD on 3 L home oxygen, anemia, age or fibrillation, recent hospitalization for 1 week for respiratory failure presents with worsening shortness of breath this morning. Worse with activity and lying flat. No known weight gain. No significant leg swelling. This is similar to previous. No blood clot history no recent surgeries, no active cancer, no unilateral leg swelling. No fever however patient has had a mild cough, nonproductive.  Patient is a 81 y.o. female presenting with shortness of breath. The history is provided by the patient.  Shortness of Breath Associated symptoms: cough   Associated symptoms: no abdominal pain, no chest pain, no fever, no headaches, no neck pain, no rash and no vomiting     Past Medical History  Diagnosis Date  . Hypertension   . Neuropathy   . Anxiety   . PONV (postoperative nausea and vomiting)   . CHF (congestive heart failure)   . COPD (chronic obstructive pulmonary disease)     on home, nocturnal oxygen.   . Family history of adverse reaction to anesthesia   . Cardiomyopathy   . Diabetes mellitus without complication     TYPE 2  . Macular degeneration, bilateral   . Varicose veins   . GIB (gastrointestinal bleeding)   . Paroxysmal atrial fibrillation    Past Surgical History  Procedure Laterality Date  . Hip fracture surgery  2004  . Abdominal hysterectomy    . Vein surgery    . Esophagogastroduodenoscopy N/A 12/01/2014    Procedure: ESOPHAGOGASTRODUODENOSCOPY (EGD);  Surgeon: Hilarie Fredrickson, MD;  Location: [facility];  Service: Endoscopy;   Laterality: N/A;   Family History  Problem Relation Age of Onset  . Colon cancer Father 27    advanced when discovered, no surgery or therapy.  this led to his death  . Breast cancer Sister   . Cancer Brother   . Breast cancer Mother   . Lung cancer Brother    History  Substance Use Topics  . Smoking status: Passive Smoke Exposure - Never Smoker  . Smokeless tobacco: Never Used  . Alcohol Use: No   OB History    No data available     Review of Systems  Constitutional: Positive for appetite change. Negative for fever and chills.  HENT: Negative for congestion.   Eyes: Negative for visual disturbance.  Respiratory: Positive for cough and shortness of breath.   Cardiovascular: Negative for chest pain.  Gastrointestinal: Negative for vomiting and abdominal pain.  Genitourinary: Negative for dysuria and flank pain.  Musculoskeletal: Negative for back pain, neck pain and neck stiffness.  Skin: Negative for rash.  Neurological: Positive for weakness (general). Negative for light-headedness and headaches.      Allergies  Other; Codeine; Penicillins; Procaine; and Yellow dyes (non-tartrazine)  Home Medications   Prior to Admission medications   Medication Sig Start Date End Date Taking? Authorizing Provider  acetaminophen (TYLENOL) 325 MG tablet Take 650 mg by mouth every 6 (six) hours as needed for mild pain.    Yes Historical Provider, MD  albuterol (PROVENTIL HFA;VENTOLIN HFA) 108 (90 BASE) MCG/ACT inhaler Inhale 2 puffs into the lungs every 6 (six)  hours as needed for wheezing or shortness of breath. 06/11/13  Yes Nishant Dhungel, MD  atorvastatin (LIPITOR) 20 MG tablet Take 20 mg by mouth at bedtime. 10/12/14 10/12/15 Yes Historical Provider, MD  cholecalciferol (VITAMIN D) 1000 UNITS tablet Take 1,000 Units by mouth at bedtime.    Yes Historical Provider, MD  erythromycin ophthalmic ointment Place into the right eye every 6 (six) hours. Use 4 times daily until 12/21/14, then  STOP. DO NOT USE UNTIL EMPTY. 12/17/14  Yes Courtney Paris, MD  escitalopram (LEXAPRO) 10 MG tablet Take 10 mg by mouth daily.   Yes Historical Provider, MD  fenofibrate 160 MG tablet Take 160 mg by mouth daily.   Yes Historical Provider, MD  gabapentin (NEURONTIN) 100 MG capsule Take 100-200 mg by mouth 2 (two) times daily. Take 1 capsule (100 mg) morning, take 2 capsules (200 mg) at bedtime   Yes Historical Provider, MD  glipiZIDE (GLUCOTROL XL) 10 MG 24 hr tablet Take 10 mg by mouth daily. 10/11/14  Yes Historical Provider, MD  hydrALAZINE (APRESOLINE) 25 MG tablet Take 25 mg by mouth 2 (two) times daily. 12/05/14  Yes Historical Provider, MD  iron polysaccharides (NIFEREX) 150 MG capsule Take 1 capsule (150 mg total) by mouth 2 (two) times daily. 12/02/14  Yes Lora Paula, MD  isosorbide mononitrate (IMDUR) 30 MG 24 hr tablet Take 30 mg by mouth daily. 12/05/14  Yes Historical Provider, MD  methimazole (TAPAZOLE) 5 MG tablet Take 1 tablet (5 mg total) by mouth daily. Patient taking differently: Take 5 mg by mouth 2 (two) times daily.  12/17/14  Yes Courtney Paris, MD  metoprolol tartrate (LOPRESSOR) 25 MG tablet Take 1 tablet (25 mg total) by mouth 2 (two) times daily. 12/02/14  Yes Lora Paula, MD  Multiple Vitamin (MULTIVITAMIN WITH MINERALS) TABS tablet Take 1 tablet by mouth daily.   Yes Historical Provider, MD  OXYGEN Inhale into the lungs.   Yes Historical Provider, MD  pantoprazole (PROTONIX) 40 MG tablet Take 1 tablet (40 mg total) by mouth daily. 12/02/14  Yes Lora Paula, MD  potassium chloride SA (K-DUR,KLOR-CON) 20 MEQ tablet Take 1 tablet (20 mEq total) by mouth 2 (two) times daily. 12/17/14  Yes Courtney Paris, MD  Pramoxine-Menthol (GOLD BOND MAXIMUM RELIEF) 1-1 % CREA Apply 1 application topically 2 (two) times daily as needed (for would care).   Yes Historical Provider, MD  torsemide (DEMADEX) 20 MG tablet Take 1 tablet (20 mg total) by mouth 2 (two) times daily. 12/17/14  Yes Courtney Paris, MD  warfarin (COUMADIN) 2.5 MG tablet Take 1 tablet (2.5 mg total) by mouth daily. 12/18/14  Yes Courtney Paris, MD   BP 181/73 mmHg  Pulse 82  Temp(Src) 98.5 F (36.9 C) (Oral)  Resp 28  SpO2 91% Physical Exam  Constitutional: She is oriented to person, place, and time. She appears well-developed. She appears distressed.  HENT:  Head: Normocephalic and atraumatic.  Eyes: Right eye exhibits no discharge. Left eye exhibits no discharge.  Neck: Normal range of motion. Neck supple. No tracheal deviation present.  Cardiovascular: Normal rate and regular rhythm.   Pulmonary/Chest: She is in respiratory distress ( tachypnea and crackles lower and mid x-ray wheezes anterior and upper). She has wheezes. She has rales.  Abdominal: Soft. She exhibits no distension. There is tenderness (abdominal breathing). There is no guarding.  Musculoskeletal: She exhibits no edema.  Neurological: She is alert and oriented to person, place,  and time.  Skin: Skin is warm. No rash noted.  Psychiatric: She has a normal mood and affect.  Nursing note and vitals reviewed.   ED Course  Procedures (including critical care time) CRITICAL CARE Performed by: Enid Skeens   Total critical care time: 35 m in  Critical care time was exclusive of separately billable procedures and treating other patients.  Critical care was necessary to treat or prevent imminent or life-threatening deterioration.  Critical care was time spent personally by me on the following activities: development of treatment plan with patient and/or surrogate as well as nursing, discussions with consultants, evaluation of patient's response to treatment, examination of patient, obtaining history from patient or surrogate, ordering and performing treatments and interventions, ordering and review of laboratory studies, ordering and review of radiographic studies, pulse oximetry and re-evaluation of patient's condition.     EMERGENCY  DEPARTMENT [place] CARDIAC EXAM "Study: Limited Ultrasound of the heart and pericardium"  INDICATIONS:Dyspnea Multiple views of the heart and pericardium were obtained in real-time with a multi-frequency probe.  PERFORMED ZO:XWRUEA  IMAGES ARCHIVED?: Yes  FINDINGS: No pericardial effusion, Normal contractility, IVC dilated and Tamponade physiology absent  LIMITATIONS:  Emergent procedure  VIEWS USED: Subcostal 4 chamber, Parasternal long axis, Parasternal short axis, Apical 4 chamber  and Inferior Vena Cava  INTERPRETATION: Cardiac activity present, Pericardial effusioin absent, Cardiac tamponade absent, Probable elevated CVP and Normal contractility  CPT Code: 54098-11 (limited transthoracic cardiac)  EMERGENCY DEPARTMENT [place] LUNG EXAM "Study: Limited Ultrasound of the lung and thorax"   INDICATIONS: Dyspnea  Multiple views of both lungs using sagittal orientation were obtained.  PERFORMED BY: Myself  IMAGES ARCHIVED?: Yes  FINDINGS: Pleural Effusion and B lines  LIMITATIONS: Body habitus and Respiratory Distress  VIEWS USED: Anterior Lung Fields and Posterior Lung Fields  INTERPRETATION: No Pneumothorax, Pulmonary Edema, Pleural Effusion  CPT Code: 91478-29 (limited transthoracic lung)   Labs Review Labs Reviewed  BASIC METABOLIC PANEL - Abnormal; Notable for the following:    Glucose, Bld 188 (*)    BUN 48 (*)    Creatinine, Ser 2.95 (*)    Calcium 8.8 (*)    GFR calc non Af Amer 14 (*)    GFR calc Af Amer 16 (*)    All other components within normal limits  CBC WITH DIFFERENTIAL/PLATELET - Abnormal; Notable for the following:    RBC 3.29 (*)    Hemoglobin 8.8 (*)    HCT 28.8 (*)    RDW 18.2 (*)    Platelets 424 (*)    Neutrophils Relative % 82 (*)    Neutro Abs 8.5 (*)    Lymphocytes Relative 8 (*)    All other components within normal limits  BRAIN NATRIURETIC PEPTIDE - Abnormal; Notable for the following:    B Natriuretic Peptide 557.5 (*)    All other  components within normal limits  PROTIME-INR - Abnormal; Notable for the following:    Prothrombin Time 25.7 (*)    INR 2.38 (*)    All other components within normal limits  CULTURE, BLOOD (ROUTINE X 2)  CULTURE, BLOOD (ROUTINE X 2)  TROPONIN I    Imaging Review Dg Chest Portable 1 View  12/21/2014   CLINICAL DATA:  Acute shortness of breath.  EXAM: PORTABLE CHEST - 1 VIEW  COMPARISON:  December 13, 2014.  FINDINGS: Stable cardiomegaly. No pneumothorax is noted. Minimal right pleural effusion is noted. Bilateral perihilar and basilar opacities are noted concerning for edema or pneumonia.  Bony thorax is intact.  IMPRESSION: Bilateral perihilar and basilar opacities are noted concerning for edema or pneumonia. Minimal right pleural effusion is noted.   Electronically Signed   By: Lupita Raider, M.D.   On: 12/21/2014 08:06     EKG Interpretation   Date/Time:  Wednesday December 21 2014 07:12:42 EDT Ventricular Rate:  84 PR Interval:  218 QRS Duration: 82 QT Interval:  356 QTC Calculation: 420 R Axis:   49 Text Interpretation:  Sinus rhythm with 1st degree A-V block Otherwise  normal ECG Confirmed by Lovel Suazo  MD, Eligah Anello ([id number]) on 12/21/2014 8:07:33 AM      MDM   Final diagnoses:  HCAP (healthcare-associated pneumonia)  Acute respiratory distress  Anemia, unspecified anemia type  Pleural effusion, bilateral   Patient presents hypoxic with significant respiratory difficulty and effort of breathing. Patient has multiple reasons to be short of breath but that ultrasound should be lines pleural effusion grossly normal ejection fraction concern for mostly acute pulmonary edema however patient also has wheezes and COPD history. Plan for blood work, ABG, Lasix and possible BiPAP depending on patient's clinical status. Patient is requiring 4 L nasal cannula and has increased effort breathing.  Spoke with IM, accepted stepdown.  The patients results and plan were reviewed and discussed.   Any  x-rays performed were independently reviewed by myself.   Differential diagnosis were considered with the presenting HPI.  Medications  aztreonam (AZACTAM) 2 g in dextrose 5 % 50 mL IVPB (not administered)  vancomycin (VANCOCIN) 1,250 mg in sodium chloride 0.9 % 250 mL IVPB (not administered)  vancomycin (VANCOCIN) IVPB 1000 mg/200 mL premix (not administered)  ipratropium-albuterol (DUONEB) 0.5-2.5 (3) MG/3ML nebulizer solution 3 mL (3 mLs Nebulization Given 12/21/14 0825)  furosemide (LASIX) injection 80 mg (80 mg Intravenous Given 12/21/14 0855)    Filed Vitals:   12/21/14 0720 12/21/14 0732 12/21/14 0825  BP: 183/60 181/73   Pulse: 85 82   Temp: 97.9 F (36.6 C) 98.5 F (36.9 C)   TempSrc:  Oral   Resp: 35 28   SpO2: 73% 90% 91%    Final diagnoses:  HCAP (healthcare-associated pneumonia)  Acute respiratory distress  Anemia, unspecified anemia type  Pleural effusion, bilateral    Admission/ observation were discussed with the admitting physician, patient and/or family and they are comfortable with the plan.      Blane Ohara, MD 12/21/14 [phone]

## 2014-12-21 NOTE — Progress Notes (Signed)
Tapazole 5mg  moved to 19:30, paged pharmacy still awaiting medication

## 2014-12-21 NOTE — Progress Notes (Signed)
Pt desat 76% while using bedpan. Respiratory in the room, pt transitioned to venturi mask on 50%. MD notified

## 2014-12-21 NOTE — Progress Notes (Signed)
Took pt off BiPAP at approximately 2315 to take her bedtime medicine. Placed pt on 6LNC. Pt O2 sats 93-95%. Pt is maintaining her sats on [facility] and is not in any distress. Discussed with respiratory therapist; will leave pt on nasal cannula for now and call respiratory if pt begins to show signs of distress.

## 2014-12-21 NOTE — Progress Notes (Addendum)
ANTIBIOTIC CONSULT NOTE - INITIAL  Pharmacy Consult:  Vancomycin Indication:  PNA  Allergies  Allergen Reactions  . Other     novocaine broke mouth out  . Codeine Hives  . Penicillins Hives  . Procaine Hives  . Yellow Dyes (Non-Tartrazine) Nausea And Vomiting    "deathly allergic" No other information given to explain reaction.    Patient Measurements: Height = 62 inches Weight = 70 kg  Vital Signs: Temp: 98.5 F (36.9 C) (07/27 0732) Temp Source: Oral (07/27 0732) BP: 181/73 mmHg (07/27 0732) Pulse Rate: 82 (07/27 0732)  Labs:  Recent Labs  12/21/14 0745  WBC 10.3  HGB 8.8*  PLT 424*  CREATININE 2.95*   Estimated Creatinine Clearance: 14 mL/min (by C-G formula based on Cr of 2.95). No results for input(s): VANCOTROUGH, VANCOPEAK, VANCORANDOM, GENTTROUGH, GENTPEAK, GENTRANDOM, TOBRATROUGH, TOBRAPEAK, TOBRARND, AMIKACINPEAK, AMIKACINTROU, AMIKACIN in the last 72 hours.   Microbiology: Recent Results (from the past 720 hour(s))  Culture, Urine     Status: None   Collection Time: 12/01/14  3:30 PM  Result Value Ref Range Status   Specimen Description URINE, RANDOM  Final   Special Requests NONE  Final   Culture NO GROWTH 2 DAYS  Final   Report Status 12/03/2014 FINAL  Final    Medical History: Past Medical History  Diagnosis Date  . Hypertension   . Neuropathy   . Anxiety   . PONV (postoperative nausea and vomiting)   . CHF (congestive heart failure)   . COPD (chronic obstructive pulmonary disease)     on home, nocturnal oxygen.   . Family history of adverse reaction to anesthesia   . Cardiomyopathy   . Diabetes mellitus without complication     TYPE 2  . Macular degeneration, bilateral   . Varicose veins   . GIB (gastrointestinal bleeding)   . Paroxysmal atrial fibrillation       Assessment: 82 YOM recently admitted for dCHF exacerbation.  Patient returned today with SOB and nonproductive cough.  Pharmacy consulted to initiate vancomycin for  PNA.  Noted patient has a history of CKD3 and her SCr is elevated at 2.95 (estimated CrCL 14 ml/min).   Goal of Therapy:  Vancomycin trough level 15-20 mcg/ml   Plan:  - Vanc 1gm IV Q48H x8 days total - Monitor renal fxn, clinical progress, vanc trough if indicated - F/U continuation of Gram negative coverage, anticoagualtion   Pihu Basil D. Laney Potash, PharmD, BCPS Pager:  [phone] 12/21/2014, 8:55 AM  ======================================   Addendum: - continue Azactam - resume Coumadin from PTA, INR therapeutic at 2.38 - home Coumadin dose: 2.5mg  PO daily, last dose 7/26   Goal of Therapy: INR 2 - 3   Plan: - Azactam 500mg  IV Q8H - Coumadin 2.5mg  PO today - Daily PT / INR    Driana Dazey D. Laney Potash, PharmD, BCPS Pager:  [phone] 12/21/2014, 12:36 PM

## 2014-12-22 ENCOUNTER — Inpatient Hospital Stay (HOSPITAL_COMMUNITY): Payer: Medicare Other

## 2014-12-22 ENCOUNTER — Encounter (HOSPITAL_COMMUNITY): Payer: Self-pay | Admitting: Cardiovascular Disease

## 2014-12-22 DIAGNOSIS — I130 Hypertensive heart and chronic kidney disease with heart failure and stage 1 through stage 4 chronic kidney disease, or unspecified chronic kidney disease: Principal | ICD-10-CM

## 2014-12-22 DIAGNOSIS — Z7901 Long term (current) use of anticoagulants: Secondary | ICD-10-CM

## 2014-12-22 DIAGNOSIS — Z79899 Other long term (current) drug therapy: Secondary | ICD-10-CM

## 2014-12-22 DIAGNOSIS — I5033 Acute on chronic diastolic (congestive) heart failure: Secondary | ICD-10-CM | POA: Diagnosis present

## 2014-12-22 DIAGNOSIS — E1122 Type 2 diabetes mellitus with diabetic chronic kidney disease: Secondary | ICD-10-CM

## 2014-12-22 DIAGNOSIS — Z794 Long term (current) use of insulin: Secondary | ICD-10-CM

## 2014-12-22 DIAGNOSIS — Z9981 Dependence on supplemental oxygen: Secondary | ICD-10-CM

## 2014-12-22 DIAGNOSIS — N179 Acute kidney failure, unspecified: Secondary | ICD-10-CM

## 2014-12-22 DIAGNOSIS — D509 Iron deficiency anemia, unspecified: Secondary | ICD-10-CM

## 2014-12-22 DIAGNOSIS — E213 Hyperparathyroidism, unspecified: Secondary | ICD-10-CM

## 2014-12-22 DIAGNOSIS — E1165 Type 2 diabetes mellitus with hyperglycemia: Secondary | ICD-10-CM

## 2014-12-22 DIAGNOSIS — J449 Chronic obstructive pulmonary disease, unspecified: Secondary | ICD-10-CM

## 2014-12-22 DIAGNOSIS — R0602 Shortness of breath: Secondary | ICD-10-CM

## 2014-12-22 DIAGNOSIS — N183 Chronic kidney disease, stage 3 (moderate): Secondary | ICD-10-CM

## 2014-12-22 LAB — BASIC METABOLIC PANEL
Anion gap: 12 (ref 5–15)
BUN: 51 mg/dL — ABNORMAL HIGH (ref 6–20)
CO2: 27 mmol/L (ref 22–32)
CREATININE: 2.69 mg/dL — AB (ref 0.44–1.00)
Calcium: 9.1 mg/dL (ref 8.9–10.3)
Chloride: 104 mmol/L (ref 101–111)
GFR calc Af Amer: 18 mL/min — ABNORMAL LOW (ref >60)
GFR calc non Af Amer: 16 mL/min — ABNORMAL LOW (ref >60)
Glucose, Bld: 259 mg/dL — ABNORMAL HIGH (ref 65–99)
POTASSIUM: 4.7 mmol/L (ref 3.5–5.1)
SODIUM: 143 mmol/L (ref 135–145)

## 2014-12-22 LAB — CBC
HCT: 25.6 % — ABNORMAL LOW (ref 36.0–46.0)
HEMOGLOBIN: 7.8 g/dL — AB (ref 12.0–15.0)
MCH: 26.4 pg (ref 26.0–34.0)
MCHC: 30.5 g/dL (ref 30.0–36.0)
MCV: 86.5 fL (ref 78.0–100.0)
Platelets: 361 10*3/uL (ref 150–400)
RBC: 2.96 MIL/uL — ABNORMAL LOW (ref 3.87–5.11)
RDW: 18.4 % — ABNORMAL HIGH (ref 11.5–15.5)
WBC: 8 10*3/uL (ref 4.0–10.5)

## 2014-12-22 LAB — GLUCOSE, CAPILLARY
GLUCOSE-CAPILLARY: 325 mg/dL — AB (ref 65–99)
Glucose-Capillary: 218 mg/dL — ABNORMAL HIGH (ref 65–99)
Glucose-Capillary: 318 mg/dL — ABNORMAL HIGH (ref 65–99)
Glucose-Capillary: 322 mg/dL — ABNORMAL HIGH (ref 65–99)

## 2014-12-22 LAB — PROTIME-INR
INR: 3.22 — ABNORMAL HIGH (ref 0.00–1.49)
Prothrombin Time: 32.2 seconds — ABNORMAL HIGH (ref 11.6–15.2)

## 2014-12-22 MED ORDER — PREDNISONE 20 MG PO TABS
40.0000 mg | ORAL_TABLET | Freq: Every day | ORAL | Status: DC
  Administered 2014-12-22 – 2014-12-24 (×3): 40 mg via ORAL
  Filled 2014-12-22 (×3): qty 2

## 2014-12-22 MED ORDER — LEVALBUTEROL HCL 0.63 MG/3ML IN NEBU
0.6300 mg | INHALATION_SOLUTION | Freq: Three times a day (TID) | RESPIRATORY_TRACT | Status: DC
  Administered 2014-12-22 – 2014-12-24 (×6): 0.63 mg via RESPIRATORY_TRACT
  Filled 2014-12-22 (×7): qty 3

## 2014-12-22 MED ORDER — WARFARIN SODIUM 1 MG PO TABS
1.0000 mg | ORAL_TABLET | Freq: Once | ORAL | Status: AC
  Administered 2014-12-22: 1 mg via ORAL
  Filled 2014-12-22: qty 1

## 2014-12-22 NOTE — Progress Notes (Signed)
[facility]  Patient Status: Active (receiving services up to time of hospitalization)  [facility] is providing the following services: PT  Would benefit from RN services. Please order if you agree.  If patient discharges after hours, please call [phone].   Alexis [name] 12/22/2014, 10:54 AM

## 2014-12-22 NOTE — Progress Notes (Signed)
Utilization review completed. Niv Darley, RN, BSN. 

## 2014-12-22 NOTE — Progress Notes (Signed)
    Subjective:  Feeling much better today. Shortness of breath improved. No chest pain or pressure.  Objective:  Vital Signs in the last 24 hours: Temp:  [98 F (36.7 C)-99.1 F (37.3 C)] 98.6 F (37 C) (07/28 0754) Pulse Rate:  [63-93] 70 (07/28 0754) Resp:  [14-28] 17 (07/28 0737) BP: (116-168)/(35-65) 129/42 mmHg (07/28 0754) SpO2:  [87 %-99 %] 99 % (07/28 0754) FiO2 (%):  [40 %-100 %] 40 % (07/27 2030) Weight:  [151 lb (68.493 kg)] 151 lb (68.493 kg) (07/28 0520)  Intake/Output from previous day: 07/27 0701 - 07/28 0700 In: 420 [P.O.:120; I.V.:300] Out: 1400 [Urine:1400]  Physical Exam: Pt is alert and oriented, pleasant elderly appearing woman in NAD HEENT: normal Neck: JVP - normal Lungs: Coarse bilaterally with scattered rhonchi CV: RRR without murmur or gallop Abd: soft, NT, Positive BS, no hepatomegaly Ext: no C/C/E Skin: warm/dry no rash   Lab Results:  Recent Labs  12/21/14 0745 12/22/14 0408  WBC 10.3 8.0  HGB 8.8* 7.8*  PLT 424* 361    Recent Labs  12/21/14 0745 12/22/14 0408  NA 138 143  K 5.0 4.7  CL 101 104  CO2 26 27  GLUCOSE 188* 259*  BUN 48* 51*  CREATININE 2.95* 2.69*    Recent Labs  12/21/14 0745  TROPONINI <0.03   Tele: Personally reviewed: Normal sinus rhythm  Assessment/Plan:  1. Acute on chronic respiratory failure, improved 2. Acute on chronic diastolic heart failure 3. Acute on chronic kidney injury, improving 4. Malignant hypertension with congestive heart failure.   The patient looks much better today. She is 1 L negative on IV Lasix. I think she will benefit from another 24 hours of IV diuresis. Her renal function will have to be watched carefully. Fortunately creatinine is trending down even with diuresis. I would anticipate converting her to torsemide tomorrow. Extensive discussion about heart failure management as an outpatient. She avoids salt and reports compliance with her medications. She will need to start  doing regular daily weights. Suspect a portion of her refractory symptoms are related to underlying lung disease as a complicating factor for her difficult to manage diastolic congestive heart failure.   Tonny Bollman, M.D. 12/22/2014, 12:32 PM

## 2014-12-22 NOTE — Discharge Instructions (Addendum)
Some medications were added or changed for you:  Please take your prednisone (2 tablets) tomorrow morning. Please increase your torsemide to 2 tablets twice a day. Decrease your potassium supplements to 1 tablet daily starting on 12/25/2014. Please ask your primary care physician to recheck your potassium level.  Please call and schedule a follow up appointment with your primary care provider and cardiologist for a visit within the next 1 to 2 weeks.   Information on my medicine - Coumadin   (Warfarin)  This medication education was reviewed with me or my healthcare representative as part of my discharge preparation.  Why was Coumadin prescribed for you? Coumadin was prescribed for you because you have a blood clot or a medical condition that can cause an increased risk of forming blood clots. Blood clots can cause serious health problems by blocking the flow of blood to the heart, lung, or brain. Coumadin can prevent harmful blood clots from forming. As a reminder your indication for Coumadin is:   Stroke Prevention Because Of Atrial Fibrillation  What test will check on my response to Coumadin? While on Coumadin (warfarin) you will need to have an INR test regularly to ensure that your dose is keeping you in the desired range. The INR (international normalized ratio) number is calculated from the result of the laboratory test called prothrombin time (PT).  If an INR APPOINTMENT HAS NOT ALREADY BEEN MADE FOR YOU please schedule an appointment to have this lab work done by your health care provider within 7 days. Your INR goal is usually a number between:  2 to 3 or your provider may give you a more narrow range like 2-2.5.  Ask your health care provider during an office visit what your goal INR is.  What  do you need to  know  About  COUMADIN? Take Coumadin (warfarin) exactly as prescribed by your healthcare provider about the same time each day.  DO NOT stop taking without talking to the  doctor who prescribed the medication.  Stopping without other blood clot prevention medication to take the place of Coumadin may increase your risk of developing a new clot or stroke.  Get refills before you run out.  What do you do if you miss a dose? If you miss a dose, take it as soon as you remember on the same day then continue your regularly scheduled regimen the next day.  Do not take two doses of Coumadin at the same time.  Important Safety Information A possible side effect of Coumadin (Warfarin) is an increased risk of bleeding. You should call your healthcare provider right away if you experience any of the following: ? Bleeding from an injury or your nose that does not stop. ? Unusual colored urine (red or dark brown) or unusual colored stools (red or black). ? Unusual bruising for unknown reasons. ? A serious fall or if you hit your head (even if there is no bleeding).  Some foods or medicines interact with Coumadin (warfarin) and might alter your response to warfarin. To help avoid this: ? Eat a balanced diet, maintaining a consistent amount of Vitamin K. ? Notify your provider about major diet changes you plan to make. ? Avoid alcohol or limit your intake to 1 drink for women and 2 drinks for men per day. (1 drink is 5 oz. wine, 12 oz. beer, or 1.5 oz. liquor.)  Make sure that ANY health care provider who prescribes medication for you knows that you are taking  Coumadin (warfarin).  Also make sure the healthcare provider who is monitoring your Coumadin knows when you have started a new medication including herbals and non-prescription products.  Coumadin (Warfarin)  Major Drug Interactions  Increased Warfarin Effect Decreased Warfarin Effect  Alcohol (large quantities) Antibiotics (esp. Septra/Bactrim, Flagyl, Cipro) Amiodarone (Cordarone) Aspirin (ASA) Cimetidine (Tagamet) Megestrol (Megace) NSAIDs (ibuprofen, naproxen, etc.) Piroxicam (Feldene) Propafenone (Rythmol  SR) Propranolol (Inderal) Isoniazid (INH) Posaconazole (Noxafil) Barbiturates (Phenobarbital) Carbamazepine (Tegretol) Chlordiazepoxide (Librium) Cholestyramine (Questran) Griseofulvin Oral Contraceptives Rifampin Sucralfate (Carafate) Vitamin K   Coumadin (Warfarin) Major Herbal Interactions  Increased Warfarin Effect Decreased Warfarin Effect  Garlic Ginseng Ginkgo biloba Coenzyme Q10 Green tea St. Johns wort    Coumadin (Warfarin) FOOD Interactions  Eat a consistent number of servings per week of foods HIGH in Vitamin K (1 serving =  cup)  Collards (cooked, or boiled & drained) Kale (cooked, or boiled & drained) Mustard greens (cooked, or boiled & drained) Parsley *serving size only =  cup Spinach (cooked, or boiled & drained) Swiss chard (cooked, or boiled & drained) Turnip greens (cooked, or boiled & drained)  Eat a consistent number of servings per week of foods MEDIUM-HIGH in Vitamin K (1 serving = 1 cup)  Asparagus (cooked, or boiled & drained) Broccoli (cooked, boiled & drained, or raw & chopped) Brussel sprouts (cooked, or boiled & drained) *serving size only =  cup Lettuce, raw (green leaf, endive, romaine) Spinach, raw Turnip greens, raw & chopped   These websites have more information on Coumadin (warfarin):  http://www.king-russell.com/; https://www.hines.net/;

## 2014-12-22 NOTE — Progress Notes (Signed)
ANTICOAGULATION CONSULT NOTE  Pharmacy Consult for warfarin Indication: atrial fibrillation  Allergies  Allergen Reactions  . Other     novocaine broke mouth out  . Codeine Hives  . Penicillins Hives  . Procaine Hives  . Yellow Dyes (Non-Tartrazine) Nausea And Vomiting    "deathly allergic" No other information given to explain reaction.    Patient Measurements: Weight: 151 lb (68.493 kg) Heparin Dosing Weight:   Vital Signs: Temp: 97.7 F (36.5 C) (07/28 1511) Temp Source: Oral (07/28 1511) BP: 123/43 mmHg (07/28 1511) Pulse Rate: 60 (07/28 1151)  Labs:  Recent Labs  12/21/14 0745 12/21/14 0914 12/22/14 0408  HGB 8.8*  --  7.8*  HCT 28.8*  --  25.6*  PLT 424*  --  361  LABPROT  --  25.7* 32.2*  INR  --  2.38* 3.22*  CREATININE 2.95*  --  2.69*  TROPONINI <0.03  --   --     Estimated Creatinine Clearance: 15.1 mL/min (by C-G formula based on Cr of 2.69).   Medical History: Past Medical History  Diagnosis Date  . Hypertension   . Neuropathy   . Anxiety   . PONV (postoperative nausea and vomiting)   . CHF (congestive heart failure)   . COPD (chronic obstructive pulmonary disease)     on home, nocturnal oxygen.   . Family history of adverse reaction to anesthesia   . Cardiomyopathy   . Diabetes mellitus without complication     TYPE 2  . Macular degeneration, bilateral   . Varicose veins   . GIB (gastrointestinal bleeding)   . Paroxysmal atrial fibrillation   . Shortness of breath dyspnea   . Acute on chronic diastolic heart failure 12/22/2014    Medications:  Prescriptions prior to admission  Medication Sig Dispense Refill Last Dose  . acetaminophen (TYLENOL) 325 MG tablet Take 650 mg by mouth every 6 (six) hours as needed for mild pain.    Past Month at Unknown time  . albuterol (PROVENTIL HFA;VENTOLIN HFA) 108 (90 BASE) MCG/ACT inhaler Inhale 2 puffs into the lungs every 6 (six) hours as needed for wheezing or shortness of breath. 1 Inhaler 3  12/20/2014 at Unknown time  . atorvastatin (LIPITOR) 20 MG tablet Take 20 mg by mouth at bedtime.   12/20/2014 at Unknown time  . cholecalciferol (VITAMIN D) 1000 UNITS tablet Take 1,000 Units by mouth at bedtime.    12/20/2014 at Unknown time  . erythromycin ophthalmic ointment Place into the right eye every 6 (six) hours. Use 4 times daily until 12/21/14, then STOP. DO NOT USE UNTIL EMPTY. 3.5 g 0 12/20/2014 at Unknown time  . escitalopram (LEXAPRO) 10 MG tablet Take 10 mg by mouth daily.   12/20/2014 at Unknown time  . fenofibrate 160 MG tablet Take 160 mg by mouth daily.   12/20/2014 at Unknown time  . gabapentin (NEURONTIN) 100 MG capsule Take 100-200 mg by mouth 2 (two) times daily. Take 1 capsule (100 mg) morning, take 2 capsules (200 mg) at bedtime   12/20/2014 at Unknown time  . glipiZIDE (GLUCOTROL XL) 10 MG 24 hr tablet Take 10 mg by mouth daily.  11 12/20/2014 at Unknown time  . hydrALAZINE (APRESOLINE) 25 MG tablet Take 25 mg by mouth 2 (two) times daily.  3 12/20/2014 at Unknown time  . iron polysaccharides (NIFEREX) 150 MG capsule Take 1 capsule (150 mg total) by mouth 2 (two) times daily. 60 capsule 0 12/20/2014 at Unknown time  . isosorbide mononitrate (  IMDUR) 30 MG 24 hr tablet Take 30 mg by mouth daily.  3 12/20/2014 at Unknown time  . methimazole (TAPAZOLE) 5 MG tablet Take 1 tablet (5 mg total) by mouth daily. (Patient taking differently: Take 5 mg by mouth 2 (two) times daily. ) 30 tablet 2 12/20/2014 at Unknown time  . metoprolol tartrate (LOPRESSOR) 25 MG tablet Take 1 tablet (25 mg total) by mouth 2 (two) times daily. 60 tablet 0 12/20/2014 at 530PM  . Multiple Vitamin (MULTIVITAMIN WITH MINERALS) TABS tablet Take 1 tablet by mouth daily.   12/20/2014 at Unknown time  . OXYGEN Inhale into the lungs.   12/21/2014 at Unknown time  . pantoprazole (PROTONIX) 40 MG tablet Take 1 tablet (40 mg total) by mouth daily. 30 tablet 0 12/20/2014 at Unknown time  . potassium chloride SA (K-DUR,KLOR-CON) 20  MEQ tablet Take 1 tablet (20 mEq total) by mouth 2 (two) times daily. 60 tablet 2 12/20/2014 at Unknown time  . Pramoxine-Menthol (GOLD BOND MAXIMUM RELIEF) 1-1 % CREA Apply 1 application topically 2 (two) times daily as needed (for would care).   12/20/2014 at Unknown time  . torsemide (DEMADEX) 20 MG tablet Take 1 tablet (20 mg total) by mouth 2 (two) times daily. 60 tablet 2 12/20/2014 at Unknown time  . warfarin (COUMADIN) 2.5 MG tablet Take 1 tablet (2.5 mg total) by mouth daily. 10 tablet 0 12/20/2014 at 530PM    Assessment: Admitted with acute on chronic resp failure due to HF.  Pt is on warfarin  PTA 2.5 mg po daily. INR 2.3 > 3.2.  Goal of Therapy:  Heparin level 0.3-0.7 units/ml Monitor platelets by anticoagulation protocol: Yes   Plan:  -Warfarin 1 mg po x1 -Daily INR   Agapito Games, PharmD, BCPS Clinical Pharmacist Pager: [phone] 12/22/2014 4:17 PM

## 2014-12-22 NOTE — Progress Notes (Signed)
Subjective: Desaturation to 70s while on bedpan yesterday afternoon. She was placed on BiPAP with improvement in respiratory status. Transitioned to 6L [place]. Doing well this morning. She reports her breathing has improved.   Objective: Vital signs in last 24 hours: Filed Vitals:   12/22/14 0416 12/22/14 0520 12/22/14 0737 12/22/14 0754  BP:   129/49 129/42  Pulse: 70  73 70  Temp:  98 F (36.7 C)  98.6 F (37 C)  TempSrc:  Oral  Oral  Resp: 14  17   Weight:  151 lb (68.493 kg)    SpO2: 98%  99% 99%   Weight change:   Intake/Output Summary (Last 24 hours) at 12/22/14 1039 Last data filed at 12/21/14 2300  Gross per 24 hour  Intake    420 ml  Output   1200 ml  Net   -780 ml   General Apperance: NAD HEENT: Normocephalic, atraumatic, PERRL, EOMI, anicteric sclera Neck: Supple, trachea midline Lungs: Bibasilar rales. No wheezes, rhonchi. Breathing comfortably on [employer] Heart: Regular rate and rhythm, no murmur/rub/gallop Abdomen: Soft, nontender, nondistended, no rebound/guarding Extremities: Normal, atraumatic, warm and well perfused, trace edema Pulses: 2+ throughout Skin: No rashes or lesions Neurologic: Alert and oriented x 3. CNII-XII intact. Normal strength and sensation  Lab Results: Basic Metabolic Panel:  Recent Labs Lab 12/16/14 0501  12/21/14 0745 12/22/14 0408  NA 141  < > 138 143  K 3.7  < > 5.0 4.7  CL 98*  < > 101 104  CO2 34*  < > 26 27  GLUCOSE 107*  < > 188* 259*  BUN 30*  < > 48* 51*  CREATININE 2.05*  < > 2.95* 2.69*  CALCIUM 8.7*  < > 8.8* 9.1  MG 2.1  --   --   --   PHOS 2.6  --   --   --   < > = values in this interval not displayed.   CBC:  Recent Labs Lab 12/21/14 0745 12/22/14 0408  WBC 10.3 8.0  NEUTROABS 8.5*  --   HGB 8.8* 7.8*  HCT 28.8* 25.6*  MCV 87.5 86.5  PLT 424* 361   Cardiac Enzymes:  Recent Labs Lab 12/21/14 0745  TROPONINI <0.03   BNP (last 3 results)  Recent Labs  12/13/14 1934 12/17/14 0250  12/21/14 0745  BNP 581.2* 759.7* 557.5*   CBG:  Recent Labs Lab 12/16/14 2122 12/17/14 0627 12/17/14 1116 12/21/14 1627 12/21/14 2252 12/22/14 0757  GLUCAP 252* 123* 190* 160* 208* 218*   Thyroid Function Tests:  Recent Labs Lab 12/15/14 1340  TSH 0.261*  FREET4 1.34*   Coagulation:  Recent Labs Lab 12/16/14 0501 12/17/14 0255 12/19/14 1222 12/21/14 0914 12/22/14 0408  LABPROT 28.5* 28.5*  --  25.7* 32.2*  INR 2.72* 2.73* 2.2 2.38* 3.22*   Micro Results: Recent Results (from the past 240 hour(s))  MRSA PCR Screening     Status: None   Collection Time: 12/21/14  1:22 PM  Result Value Ref Range Status   MRSA by PCR NEGATIVE NEGATIVE Final    Comment:        The GeneXpert MRSA Assay (FDA approved for NASAL specimens only), is one component of a comprehensive MRSA colonization surveillance program. It is not intended to diagnose MRSA infection nor to guide or monitor treatment for MRSA infections.    Studies/Results: X-ray Chest Pa And Lateral  12/22/2014   CLINICAL DATA:  Hypoxia  EXAM: CHEST  2 VIEW  COMPARISON:  December 21, 2014  FINDINGS: There is persistent patchy airspace disease in the lower lobes. There has been clearing of interstitial edema from the upper lobes. No new opacity. Heart is borderline enlarged with pulmonary vascularity within normal limits. No adenopathy. There is degenerative change in the thoracic spine.  IMPRESSION: Mild cardiomegaly with bibasilar airspace opacity. Suspect edema, although superimposed pneumonia in the lower lobes cannot be excluded. There has been interval clearing of interstitial edema from the upper lobes compared to 1 day prior. No new opacity.   Electronically Signed   By: Bretta Bang III M.D.   On: 12/22/2014 08:04   Dg Chest Portable 1 View  12/21/2014   CLINICAL DATA:  Acute shortness of breath.  EXAM: PORTABLE CHEST - 1 VIEW  COMPARISON:  December 13, 2014.  FINDINGS: Stable cardiomegaly. No pneumothorax is  noted. Minimal right pleural effusion is noted. Bilateral perihilar and basilar opacities are noted concerning for edema or pneumonia. Bony thorax is intact.  IMPRESSION: Bilateral perihilar and basilar opacities are noted concerning for edema or pneumonia. Minimal right pleural effusion is noted.   Electronically Signed   By: Lupita Raider, M.D.   On: 12/21/2014 08:06   Medications: I have reviewed the patient's current medications. Scheduled Meds: . atorvastatin  20 mg Oral QHS  . chlorhexidine  15 mL Mouth Rinse BID  . cholecalciferol  1,000 Units Oral QHS  . escitalopram  10 mg Oral Daily  . fenofibrate  160 mg Oral Daily  . furosemide  80 mg Intravenous BID  . gabapentin  100 mg Oral q morning - 10a   And  . gabapentin  200 mg Oral QHS  . hydrALAZINE  25 mg Oral BID  . insulin aspart  0-15 Units Subcutaneous TID WC  . insulin aspart  0-5 Units Subcutaneous QHS  . insulin glargine  10 Units Subcutaneous QHS  . iron polysaccharides  150 mg Oral BID  . isosorbide mononitrate  30 mg Oral Daily  . levalbuterol  0.63 mg Nebulization TID  . methimazole  5 mg Oral BID  . metoprolol tartrate  25 mg Oral BID  . multivitamin with minerals  1 tablet Oral Daily  . pantoprazole  40 mg Oral Daily  . potassium chloride SA  20 mEq Oral BID  . predniSONE  40 mg Oral Q breakfast  . sodium chloride  3 mL Intravenous Q12H  . Warfarin - Pharmacist Dosing Inpatient   Does not apply q1800   Continuous Infusions:  PRN Meds:.acetaminophen, albuterol Assessment/Plan:  Acute on chronic respiratory failure with hypoxia due to acute on chronic (HFpEF) heart failure with preserved ejection fraction: Received solumedrol 125mg  x 1 yesterday. Off of BiPAP this morning. On 6L [place]. Procalcitonin negative making bacterial pulmonary infection unlikely. Repeat CXR 2v with mild cardiomegaly with bibasilar airspace opacity and interval clearing of interstitial edema from the upper lobes compared to 1 day prior. No  new opacity. - Continue IV lasix 80mg  BID - Discontinue Vanc and Aztreonam. - Neb treatments scheduled q4h - Transition solumedrol to prednisone 40mg  daily for 5 days (end after December 26, 2014).  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  -Continue to monitor  Hypertension: Continue home hydralazine, imdur and metoprolol.  Type 2 diabetes mellitus, uncontrolled - Hold home glipizide - Lantus 10 u QHS - SSI-M  COPD (chronic obstructive pulmonary disease): -Plan as above  Paroxysmal atrial fibrillation: Currently in NSR. On warfarin at target INR for  A/C  Hyperthyroidism: Continue home methimazole  Iron Deficiency Anemia: Denies current melna or bloody stools. Hb stable, 7.8 today.  -Continue iron 150 mg bid -Continue to monitor  VTE ppx: Coumadin  Dispo: Disposition is deferred at this time, awaiting improvement of current medical problems.  Anticipated discharge in approximately 1-2 day(s).   The patient does have a current PCP Elizabeth Palau, FNP) and does not need an [facility] hospital follow-up appointment after discharge.  The patient does not have transportation limitations that hinder transportation to clinic appointments.  .Services Needed at time of discharge: Y = Yes, Blank = No PT:   OT:   RN:   Equipment:   Other:     LOS: 1 day   Lora Paula, MD 12/22/2014, 10:39 AM

## 2014-12-22 NOTE — Progress Notes (Signed)
Patient seen and examined. Case d/w residents in detail.  HPI: 81 y/o female with PMH of chronic dCHF, pAfib, HTNm COPD on home O2, DM with recent admissions for CHF exacerbations (last dc'd on 7/23) p/w worsening SOB * 1 day. Patient states that on the morning of her admission her baseline SOB acutely worsened and she had orthopnea and dyspnea with minimal exertion. She also complained assoc non productive cough. She reports compliance with meds and diet.  No fevers/chills, no CP, no abd pain, no n/v, no diarrhea.   Physical Exam: Gen: AAO*3, NAD CVS: RRR, normal heart sounds Lungs: bibasilar crackles + Abd: soft, non tender, BS + Ext: trace b/l LE edema +  Assessment and Plan: 81 y/o female with worsening SOB likely secondary to recurrent dCHF exacerbation  Acute on chronic dCHF exacerbation: - C/w IV lasix. Patient is negative approx 1 litre - Will likely need increased dose of torsemide on d/c - Strict I's and O's, daily weights - Will monitor renal function on diuresis - c/w hydralazine, imdur, beta blocker - Patient rapidly improving with diuresis and procalcitonin is normal. Will d/c abx today and monitor. Unlikely PNA - Cradiology recommendations appreciated  AKI on CKD: - likely cardiorenal in etiology - Improving on diuresis. Will monitor BMP  Possible assoc COPD exacerbation: - c/w nebs - complete 5 day course of PO prednisone

## 2014-12-22 NOTE — Progress Notes (Deleted)
Patient stated that they would put BIPAP on when she is ready to go to bed. Pt notified that RT is available if assistance is needed.

## 2014-12-23 LAB — BASIC METABOLIC PANEL
ANION GAP: 8 (ref 5–15)
BUN: 71 mg/dL — ABNORMAL HIGH (ref 6–20)
CO2: 28 mmol/L (ref 22–32)
Calcium: 9.1 mg/dL (ref 8.9–10.3)
Chloride: 99 mmol/L — ABNORMAL LOW (ref 101–111)
Creatinine, Ser: 2.97 mg/dL — ABNORMAL HIGH (ref 0.44–1.00)
GFR calc Af Amer: 16 mL/min — ABNORMAL LOW (ref >60)
GFR, EST NON AFRICAN AMERICAN: 14 mL/min — AB (ref >60)
Glucose, Bld: 309 mg/dL — ABNORMAL HIGH (ref 65–99)
Potassium: 4.9 mmol/L (ref 3.5–5.1)
Sodium: 135 mmol/L (ref 135–145)

## 2014-12-23 LAB — PROTIME-INR
INR: 4.06 — ABNORMAL HIGH (ref 0.00–1.49)
PROTHROMBIN TIME: 38.4 s — AB (ref 11.6–15.2)

## 2014-12-23 LAB — CBC
HCT: 25.3 % — ABNORMAL LOW (ref 36.0–46.0)
Hemoglobin: 7.8 g/dL — ABNORMAL LOW (ref 12.0–15.0)
MCH: 26.4 pg (ref 26.0–34.0)
MCHC: 30.8 g/dL (ref 30.0–36.0)
MCV: 85.8 fL (ref 78.0–100.0)
PLATELETS: 376 10*3/uL (ref 150–400)
RBC: 2.95 MIL/uL — ABNORMAL LOW (ref 3.87–5.11)
RDW: 18.4 % — ABNORMAL HIGH (ref 11.5–15.5)
WBC: 10.2 10*3/uL (ref 4.0–10.5)

## 2014-12-23 LAB — PROCALCITONIN: PROCALCITONIN: 0.35 ng/mL

## 2014-12-23 LAB — GLUCOSE, CAPILLARY
GLUCOSE-CAPILLARY: 334 mg/dL — AB (ref 65–99)
GLUCOSE-CAPILLARY: 498 mg/dL — AB (ref 65–99)
Glucose-Capillary: 287 mg/dL — ABNORMAL HIGH (ref 65–99)
Glucose-Capillary: 158 mg/dL — ABNORMAL HIGH (ref 65–99)

## 2014-12-23 LAB — MAGNESIUM: Magnesium: 2.4 mg/dL (ref 1.7–2.4)

## 2014-12-23 LAB — PHOSPHORUS: Phosphorus: 4.3 mg/dL (ref 2.5–4.6)

## 2014-12-23 MED ORDER — METHIMAZOLE 5 MG PO TABS
5.0000 mg | ORAL_TABLET | Freq: Every day | ORAL | Status: DC
  Administered 2014-12-23 – 2014-12-24 (×2): 5 mg via ORAL
  Filled 2014-12-23 (×2): qty 1

## 2014-12-23 MED ORDER — INSULIN ASPART 100 UNIT/ML ~~LOC~~ SOLN: 0.0000 [IU] | Freq: Three times a day (TID) | SUBCUTANEOUS | Status: DC

## 2014-12-23 MED ORDER — INSULIN ASPART 100 UNIT/ML ~~LOC~~ SOLN
0.0000 [IU] | Freq: Every day | SUBCUTANEOUS | Status: DC
  Administered 2014-12-23: 5 [IU] via SUBCUTANEOUS

## 2014-12-23 MED ORDER — INSULIN GLARGINE 100 UNIT/ML ~~LOC~~ SOLN
15.0000 [IU] | Freq: Every day | SUBCUTANEOUS | Status: DC
  Administered 2014-12-23: 15 [IU] via SUBCUTANEOUS
  Filled 2014-12-23 (×2): qty 0.15

## 2014-12-23 MED ORDER — INSULIN ASPART 100 UNIT/ML ~~LOC~~ SOLN
0.0000 [IU] | Freq: Three times a day (TID) | SUBCUTANEOUS | Status: DC
  Administered 2014-12-23: 15 [IU] via SUBCUTANEOUS
  Administered 2014-12-23: 4 [IU] via SUBCUTANEOUS
  Administered 2014-12-23 – 2014-12-24 (×3): 11 [IU] via SUBCUTANEOUS

## 2014-12-23 NOTE — Progress Notes (Signed)
Dr. Ladona Ridgel notified that patient blood sugar is 498. She had just drank some prune juice to assist with constipation. He stated to give the max of 5 units of correction insulin at this time , no further orders. Patient is asymtomatic at this time. [facility] [employer]

## 2014-12-23 NOTE — Progress Notes (Signed)
Patient seen and examined. Case d/w residents in detail. I agree with findings and plan as documented in Dr. Joaquin Music note.  Patient is much improved today. Lung exam is much improved. Still with mild bibasilar rales. Will hold diuretics today and resume PO torsemide in AM but at increased dose - 40 mg bid. Cardio recommendations appreciated. Possible d/c in AM

## 2014-12-23 NOTE — Progress Notes (Signed)
    Subjective:  Feeling much better. Breathing improved. No chest pain.  Objective:  Vital Signs in the last 24 hours: Temp:  [97.7 F (36.5 C)-98.8 F (37.1 C)] 98.8 F (37.1 C) (07/29 1121) Pulse Rate:  [64-82] 64 (07/29 1121) Resp:  [16-19] 16 (07/29 1121) BP: (123-142)/(42-52) 137/47 mmHg (07/29 1121) SpO2:  [96 %-99 %] 99 % (07/29 1121) Weight:  [150 lb 6.4 oz (68.221 kg)] 150 lb 6.4 oz (68.221 kg) (07/29 0500)  Intake/Output from previous day: 07/28 0701 - 07/29 0700 In: 243 [P.O.:240; I.V.:3] Out: 1300 [Urine:1300]  Physical Exam: Pt is alert and oriented, pleasant elderly woman in NAD HEENT: normal Neck: JVP - normal Lungs: Coarse bilaterally but improved aeration CV: RRR without murmur or gallop Abd: soft, NT, Positive BS, no hepatomegaly Ext: no C/C/E, distal pulses intact and equal Skin: warm/dry no rash   Lab Results:  Recent Labs  12/22/14 0408 12/23/14 0450  WBC 8.0 10.2  HGB 7.8* 7.8*  PLT 361 376    Recent Labs  12/22/14 0408 12/23/14 0450  NA 143 135  K 4.7 4.9  CL 104 99*  CO2 27 28  GLUCOSE 259* 309*  BUN 51* 71*  CREATININE 2.69* 2.97*    Recent Labs  12/21/14 0745  TROPONINI <0.03   Tele: Personally reviewed: Normal sinus rhythm  Assessment/Plan:  1. Acute on chronic respiratory failure, improved 2. Acute on chronic diastolic heart failure, improved 3. Acute on chronic kidney injury, creatinine back up today 4. Malignant hypertension with congestive heart failure - blood pressure now well controlled  The patient is clinically improved. I think we will have to tolerate a higher creatinine. I agree with the family medicine service that her oral diarrhetic should probably be held today. Would recommend starting torsemide 40 mg twice daily tomorrow which is double her previous home dose. She will require close clinical follow-up considering her frequent hospitalizations admissions for acute heart failure.  Tonny Bollman,  M.D. 12/23/2014, 1:21 PM

## 2014-12-23 NOTE — Progress Notes (Signed)
Subjective: No acute events overnight. Doing well overall. She reports her dyspnea has improved and is close to her baseline. Denies CP. Tolerating PO intake without nausea/vomiting.  Objective: Vital signs in last 24 hours: Filed Vitals:   12/22/14 2042 12/23/14 0035 12/23/14 0500 12/23/14 0728  BP:  131/42 142/47   Pulse:  67 66   Temp:  98.2 F (36.8 C) 98 F (36.7 C) 98.4 F (36.9 C)  TempSrc:  Oral Oral Oral  Resp:  19 18   Weight:   150 lb 6.4 oz (68.221 kg)   SpO2: 96% 96% 96%    Weight change: -9.6 oz (-0.272 kg)  Intake/Output Summary (Last 24 hours) at 12/23/14 0824 Last data filed at 12/23/14 0500  Gross per 24 hour  Intake    243 ml  Output   1300 ml  Net  -1057 ml   General Apperance: NAD HEENT: Normocephalic, atraumatic, PERRL, EOMI, anicteric sclera Neck: Supple, trachea midline Lungs: Bibasilar rales. No wheezes, rhonchi. Breathing comfortably on [place] Heart: Regular rate and rhythm, no murmur/rub/gallop Abdomen: Soft, nontender, nondistended, no rebound/guarding Extremities: Normal, atraumatic, warm and well perfused, trace edema Pulses: 2+ throughout Skin: No rashes or lesions Neurologic: Alert and oriented x 3. CNII-XII intact. Normal strength and sensation  Lab Results: Basic Metabolic Panel:  Recent Labs Lab 12/22/14 0408 12/23/14 0450  NA 143 135  K 4.7 4.9  CL 104 99*  CO2 27 28  GLUCOSE 259* 309*  BUN 51* 71*  CREATININE 2.69* 2.97*  CALCIUM 9.1 9.1  MG  --  2.4  PHOS  --  4.3     CBC:  Recent Labs Lab 12/21/14 0745 12/22/14 0408 12/23/14 0450  WBC 10.3 8.0 10.2  NEUTROABS 8.5*  --   --   HGB 8.8* 7.8* 7.8*  HCT 28.8* 25.6* 25.3*  MCV 87.5 86.5 85.8  PLT 424* 361 376   Cardiac Enzymes:  Recent Labs Lab 12/21/14 0745  TROPONINI <0.03   BNP (last 3 results)  Recent Labs  12/13/14 1934 12/17/14 0250 12/21/14 0745  BNP 581.2* 759.7* 557.5*   CBG:  Recent Labs Lab 12/21/14 2252 12/22/14 0757 12/22/14 1153  12/22/14 1623 12/22/14 2141 12/23/14 0726  GLUCAP 208* 218* 318* 322* 325* 287*   Thyroid Function Tests: No results for input(s): TSH, T4TOTAL, FREET4, T3FREE, THYROIDAB in the last 168 hours. Coagulation:  Recent Labs Lab 12/17/14 0255 12/19/14 1222 12/21/14 0914 12/22/14 0408 12/23/14 0450  LABPROT 28.5*  --  25.7* 32.2* 38.4*  INR 2.73* 2.2 2.38* 3.22* 4.06*   Micro Results: Recent Results (from the past 240 hour(s))  Culture, blood (routine x 2)     Status: None (Preliminary result)   Collection Time: 12/21/14  7:45 AM  Result Value Ref Range Status   Specimen Description BLOOD LEFT ARM  Final   Special Requests BOTTLES DRAWN AEROBIC AND ANAEROBIC 5CC  Final   Culture NO GROWTH 1 DAY  Final   Report Status PENDING  Incomplete  Culture, blood (routine x 2)     Status: None (Preliminary result)   Collection Time: 12/21/14  9:15 AM  Result Value Ref Range Status   Specimen Description BLOOD RIGHT ARM  Final   Special Requests BOTTLES DRAWN AEROBIC AND ANAEROBIC 3CC  Final   Culture NO GROWTH 1 DAY  Final   Report Status PENDING  Incomplete  MRSA PCR Screening     Status: None   Collection Time: 12/21/14  1:22 PM  Result Value Ref  Range Status   MRSA by PCR NEGATIVE NEGATIVE Final    Comment:        The GeneXpert MRSA Assay (FDA approved for NASAL specimens only), is one component of a comprehensive MRSA colonization surveillance program. It is not intended to diagnose MRSA infection nor to guide or monitor treatment for MRSA infections.    Studies/Results: X-ray Chest Pa And Lateral  12/22/2014   CLINICAL DATA:  Hypoxia  EXAM: CHEST  2 VIEW  COMPARISON:  December 21, 2014  FINDINGS: There is persistent patchy airspace disease in the lower lobes. There has been clearing of interstitial edema from the upper lobes. No new opacity. Heart is borderline enlarged with pulmonary vascularity within normal limits. No adenopathy. There is degenerative change in the thoracic  spine.  IMPRESSION: Mild cardiomegaly with bibasilar airspace opacity. Suspect edema, although superimposed pneumonia in the lower lobes cannot be excluded. There has been interval clearing of interstitial edema from the upper lobes compared to 1 day prior. No new opacity.   Electronically Signed   By: Bretta Bang III M.D.   On: 12/22/2014 08:04   Medications: I have reviewed the patient's current medications. Scheduled Meds: . atorvastatin  20 mg Oral QHS  . chlorhexidine  15 mL Mouth Rinse BID  . cholecalciferol  1,000 Units Oral QHS  . escitalopram  10 mg Oral Daily  . fenofibrate  160 mg Oral Daily  . furosemide  80 mg Intravenous BID  . gabapentin  100 mg Oral q morning - 10a   And  . gabapentin  200 mg Oral QHS  . hydrALAZINE  25 mg Oral BID  . insulin aspart  0-20 Units Subcutaneous TID WC  . insulin aspart  0-5 Units Subcutaneous QHS  . insulin glargine  15 Units Subcutaneous QHS  . iron polysaccharides  150 mg Oral BID  . isosorbide mononitrate  30 mg Oral Daily  . levalbuterol  0.63 mg Nebulization TID  . methimazole  5 mg Oral Daily  . metoprolol tartrate  25 mg Oral BID  . multivitamin with minerals  1 tablet Oral Daily  . pantoprazole  40 mg Oral Daily  . potassium chloride SA  20 mEq Oral BID  . predniSONE  40 mg Oral Q breakfast  . sodium chloride  3 mL Intravenous Q12H  . Warfarin - Pharmacist Dosing Inpatient   Does not apply q1800   Continuous Infusions:  PRN Meds:.acetaminophen, albuterol Assessment/Plan:  Acute on chronic respiratory failure with hypoxia due to acute on chronic (HFpEF) heart failure with preserved ejection fraction: Received solumedrol 125mg  x 1 hospital day 1. Off of BiPAP. Procalcitonin negative making bacterial pulmonary infection unlikely. Repeat CXR 2v with mild cardiomegaly with bibasilar airspace opacity and interval clearing of interstitial edema from the upper lobes compared to 1 day prior. No new opacity. Now down to 2.5 L [place].  Worsening kidney function today - Cardiology following, appreciate recommendations - Hold IV lasix 80mg  BID - Neb treatments scheduled q4h - Continue prednisone 40mg  daily for 5 days (end after December 26, 2014).  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  -Continue to monitor  Hypertension: Continue home hydralazine, imdur and metoprolol.  Type 2 diabetes mellitus, uncontrolled - Hold home glipizide - Lantus 15 u QHS - SSI-M  COPD (chronic obstructive pulmonary disease): -Plan as above  Paroxysmal atrial fibrillation: On warfarin.  Hyperthyroidism: Continue home methimazole  Iron Deficiency Anemia: Denies current melna or bloody stools.  Hb stable, 7.8 today.  -Continue iron 150 mg bid -Continue to monitor  VTE ppx: Coumadin  Dispo: Disposition is deferred at this time, awaiting improvement of current medical problems.  Anticipated discharge in approximately 1 day(s).   The patient does have a current PCP Elizabeth Palau, FNP) and does not need an [facility] hospital follow-up appointment after discharge.  The patient does not have transportation limitations that hinder transportation to clinic appointments.  .Services Needed at time of discharge: Y = Yes, Blank = No PT:   OT:   RN:   Equipment:   Other:     LOS: 2 days   Lora Paula, MD 12/23/2014, 8:24 AM

## 2014-12-23 NOTE — Care Management Note (Signed)
Case Management Note  Patient Details  Name: HELVI SRADER MRN: [id number] Date of Birth: 08/14/1931  Subjective/Objective:   Pt admitted for Acute on Chronic Respiratory Failure. Pt was active with [facility] for PT services- pt will benefit from [facility] RN as well.                  Action/Plan: MD please write orders for [facility] services/ F2F if the plan continues to be for Home. [facility] is aware pt is inpatient. SOC to begin withitn 24-48 hrs of d/c. No further needs from CM at this time.    Expected Discharge Date:                  Expected Discharge Plan:  Home w Home Health Services  In-House Referral:     Discharge planning Services  CM Consult  Post Acute Care Choice:  Home Health, Resumption of Svcs/PTA Provider Choice offered to:  Patient  DME Arranged:    DME Agency:     HH Arranged:  RN, PT HH Agency:  [facility]  Status of Service:  In process, will continue to follow  Medicare Important Message Given:    Date Medicare IM Given:    Medicare IM give by:    Date Additional Medicare IM Given:    Additional Medicare Important Message give by:     If discussed at Long Length of Stay Meetings, dates discussed:    Additional Comments:  Gala Lewandowsky, RN 12/23/2014, 12:05 PM

## 2014-12-23 NOTE — Evaluation (Signed)
Physical Therapy Evaluation Patient Details Name: Alexis [name] MRN: [id number] DOB: Jul 29, 1931 Today's Date: 12/23/2014   History of Present Illness  Alexis [name] is a 81 yo F with PMH of dCHF, paroxsymal Afib, HTN, COPD on chronic home O2, and DM who returns to the ED today with complaints of increasing dyspnea. She was discharged from the hospital on Sunday the 23rd after treatment for acute CHF exacerbation.  Clinical Impression  Pt admitted with/for dCHF and COPD exacerbation.  Pt currently limited functionally due to the problems listed below.  (see problems list.)  Pt will benefit from PT to maximize function and safety to be able to get home safely with available assist of family.     Follow Up Recommendations Supervision/Assistance - 24 hour    Equipment Recommendations       Recommendations for Other Services       Precautions / Restrictions Precautions Precautions: Fall Restrictions Weight Bearing Restrictions: No      Mobility  Bed Mobility               General bed mobility comments: already OOB  Transfers Overall transfer level: Needs assistance Equipment used: Rolling walker (2 wheeled) Transfers: Sit to/from Stand Sit to Stand: Supervision         General transfer comment: cues for hand placement  Ambulation/Gait Ambulation/Gait assistance: Supervision Ambulation Distance (Feet): 160 Feet Assistive device: Rolling walker (2 wheeled) Gait Pattern/deviations: Step-through pattern Gait velocity: slower Gait velocity interpretation: at or above normal speed for age/gender General Gait Details: slower steps, but steady  Stairs            Wheelchair Mobility    Modified Rankin (Stroke Patients Only)       Balance Overall balance assessment: Needs assistance Sitting-balance support: Feet supported Sitting balance-Leahy Scale: Good       Standing balance-Leahy Scale: Fair                                Pertinent Vitals/Pain Pain Assessment: No/denies pain    Home Living Family/patient expects to be discharged to:: Private residence Living Arrangements: Children Available Help at Discharge:  (son work about 7 hours/day and pt is alone) Type of Home: House Home Access: Stairs to enter Entrance Stairs-Rails: None Secretary/administrator of Steps: 1 Home Layout: One level Home Equipment: Shower seat;Bedside commode;Walker - 4 wheels Additional Comments: Pt sleeps in recliner    Prior Function Level of Independence: Independent with assistive device(s)         Comments: Uses walker at home. History of 2 falls this year     Hand Dominance        Extremity/Trunk Assessment   Upper Extremity Assessment: Generalized weakness           Lower Extremity Assessment: Generalized weakness;Overall [facility] for tasks assessed      Cervical / Trunk Assessment: Normal  Communication   Communication: HOH  Cognition Arousal/Alertness: Awake/alert Behavior During Therapy: WFL for tasks assessed/performed Overall Cognitive Status: Within Functional Limits for tasks assessed                      General Comments General comments (skin integrity, edema, etc.): Sats on 2L during ambulation at 93% and EHR 83 bpm    Exercises        Assessment/Plan    PT Assessment Patient needs continued PT services  PT Diagnosis Generalized weakness  PT Problem List Decreased strength;Decreased activity tolerance;Decreased balance;Cardiopulmonary status limiting activity  PT Treatment Interventions DME instruction;Gait training;Functional mobility training;Therapeutic activities;Therapeutic exercise;Balance training;Patient/family education;Stair training   PT Goals (Current goals can be found in the Care Plan section) Acute Rehab PT Goals Patient Stated Goal: Return home PT Goal Formulation: All assessment and education complete, DC therapy Time For Goal Achievement:  12/30/14 Potential to Achieve Goals: Good    Frequency Min 3X/week   Barriers to discharge        Co-evaluation               End of Session Equipment Utilized During Treatment: Oxygen Activity Tolerance: Patient tolerated treatment well Patient left: in chair;with call bell/phone within reach;with chair alarm set Nurse Communication: Mobility status         Time: 1610-9604 PT Time Calculation (min) (ACUTE ONLY): 32 min   Charges:   PT Evaluation $Initial PT Evaluation Tier I: 1 Procedure PT Treatments $Gait Training: 8-22 mins   PT G Codes:        Aasha Dina, Eliseo Gum 12/23/2014, 11:32 AM

## 2014-12-23 NOTE — Progress Notes (Signed)
Occupational Therapy Evaluation Patient Details Name: JOYCELYN [name] MRN: [id number] DOB: 1932/02/20 Today's Date: 12/23/2014    History of Present Illness Alexis [name] is a 81 yo F with PMH of dCHF, paroxsymal Afib, HTN, COPD on chronic home O2, and DM who returns to the ED today with complaints of increasing dyspnea. She was discharged from the hospital on Sunday the 23rd after treatment for acute CHF exacerbation.   Clinical Impression   PTA, pt mod i with mobility and ADL. Pt will benefit from education regarding energy conservation techniques. Pt safe to d/C home with intermittent S when medically stable. Pt states family can provide necessary level of S    Follow Up Recommendations  No OT follow up;Supervision - Intermittent    Equipment Recommendations  None recommended by OT    Recommendations for Other Services       Precautions / Restrictions Precautions Precautions: Fall      Mobility Bed Mobility Overal bed mobility: Modified Independent             General bed mobility comments: already OOB  Transfers Overall transfer level: Needs assistance Equipment used: Rolling walker (2 wheeled) Transfers: Sit to/from Stand Sit to Stand: Supervision         General transfer comment: cues for hand placement    Balance Overall balance assessment: Needs assistance Sitting-balance support: Feet supported Sitting balance-Leahy Scale: Good       Standing balance-Leahy Scale: Fair                              ADL Overall ADL's : At baseline                                             Vision     Perception     Praxis      Pertinent Vitals/Pain Pain Assessment: No/denies pain     Hand Dominance     Extremity/Trunk Assessment Upper Extremity Assessment Upper Extremity Assessment: Generalized weakness   Lower Extremity Assessment Lower Extremity Assessment: Defer to PT evaluation   Cervical /  Trunk Assessment Cervical / Trunk Assessment: Normal   Communication Communication Communication: HOH   Cognition Arousal/Alertness: Awake/alert Behavior During Therapy: WFL for tasks assessed/performed Overall Cognitive Status: Within Functional Limits for tasks assessed           Alexis [name], OTR/L  [phone] 12/23/2014           General Comments       Exercises       Shoulder Instructions      Home Living Family/patient expects to be discharged to:: Private residence Living Arrangements: Children Available Help at Discharge:  (son work about 7 hours/day and pt is alone) Type of Home: House Home Access: Stairs to enter Secretary/administrator of Steps: 1 Entrance Stairs-Rails: None Home Layout: One level     Bathroom Shower/Tub: Walk-in shower;Door   [employer] Toilet: Handicapped height Bathroom Accessibility: Yes   Home Equipment: Shower seat;Bedside commode;Walker - 4 wheels   Additional Comments: Pt sleeps in recliner      Prior Functioning/Environment Level of Independence: Independent with assistive device(s)        Comments: Uses walker at home. History of 2 falls this year    OT Diagnosis: Generalized weakness   OT Problem List: Decreased activity  tolerance;Cardiopulmonary status limiting activity   OT Treatment/Interventions: Self-care/ADL training;Energy conservation;Therapeutic activities;Patient/family education    OT Goals(Current goals can be found in the care plan section) Acute Rehab OT Goals Patient Stated Goal: Return home OT Goal Formulation: With patient Time For Goal Achievement: 12/30/14 Potential to Achieve Goals: Good  OT Frequency: Min 2X/week   Barriers to D/C:            Co-evaluation              End of Session Equipment Utilized During Treatment: Rolling walker;Oxygen Nurse Communication: Mobility status  Activity Tolerance: Patient tolerated treatment well Patient left: in chair;with call bell/phone  within reach   Time: 1258-1319 OT Time Calculation (min): 21 min Charges:  OT General Charges $OT Visit: 1 Procedure OT Evaluation $Initial OT Evaluation Tier I: 1 Procedure G-Codes:    Alexis [name],Alexis [name] 01-11-2015, 1:28 PM

## 2014-12-23 NOTE — Progress Notes (Signed)
ANTICOAGULATION CONSULT NOTE  Pharmacy Consult for warfarin Indication: atrial fibrillation  Allergies  Allergen Reactions  . Other     novocaine broke mouth out  . Codeine Hives  . Penicillins Hives  . Procaine Hives  . Yellow Dyes (Non-Tartrazine) Nausea And Vomiting    "deathly allergic" No other information given to explain reaction.    Patient Measurements: Weight: 150 lb 6.4 oz (68.221 kg) Heparin Dosing Weight:   Vital Signs: Temp: 98.4 F (36.9 C) (07/29 0728) Temp Source: Oral (07/29 0728) BP: 142/47 mmHg (07/29 0500) Pulse Rate: 66 (07/29 0500)  Labs:  Recent Labs  12/21/14 0745 12/21/14 0914 12/22/14 0408 12/23/14 0450  HGB 8.8*  --  7.8* 7.8*  HCT 28.8*  --  25.6* 25.3*  PLT 424*  --  361 376  LABPROT  --  25.7* 32.2* 38.4*  INR  --  2.38* 3.22* 4.06*  CREATININE 2.95*  --  2.69* 2.97*  TROPONINI <0.03  --   --   --     Estimated Creatinine Clearance: 13.7 mL/min (by C-G formula based on Cr of 2.97).   Medical History: Past Medical History  Diagnosis Date  . Hypertension   . Neuropathy   . Anxiety   . PONV (postoperative nausea and vomiting)   . CHF (congestive heart failure)   . COPD (chronic obstructive pulmonary disease)     on home, nocturnal oxygen.   . Family history of adverse reaction to anesthesia   . Cardiomyopathy   . Diabetes mellitus without complication     TYPE 2  . Macular degeneration, bilateral   . Varicose veins   . GIB (gastrointestinal bleeding)   . Paroxysmal atrial fibrillation   . Shortness of breath dyspnea   . Acute on chronic diastolic heart failure 12/22/2014    Medications:  Prescriptions prior to admission  Medication Sig Dispense Refill Last Dose  . acetaminophen (TYLENOL) 325 MG tablet Take 650 mg by mouth every 6 (six) hours as needed for mild pain.    Past Month at Unknown time  . albuterol (PROVENTIL HFA;VENTOLIN HFA) 108 (90 BASE) MCG/ACT inhaler Inhale 2 puffs into the lungs every 6 (six) hours as  needed for wheezing or shortness of breath. 1 Inhaler 3 12/20/2014 at Unknown time  . atorvastatin (LIPITOR) 20 MG tablet Take 20 mg by mouth at bedtime.   12/20/2014 at Unknown time  . cholecalciferol (VITAMIN D) 1000 UNITS tablet Take 1,000 Units by mouth at bedtime.    12/20/2014 at Unknown time  . erythromycin ophthalmic ointment Place into the right eye every 6 (six) hours. Use 4 times daily until 12/21/14, then STOP. DO NOT USE UNTIL EMPTY. 3.5 g 0 12/20/2014 at Unknown time  . escitalopram (LEXAPRO) 10 MG tablet Take 10 mg by mouth daily.   12/20/2014 at Unknown time  . fenofibrate 160 MG tablet Take 160 mg by mouth daily.   12/20/2014 at Unknown time  . gabapentin (NEURONTIN) 100 MG capsule Take 100-200 mg by mouth 2 (two) times daily. Take 1 capsule (100 mg) morning, take 2 capsules (200 mg) at bedtime   12/20/2014 at Unknown time  . glipiZIDE (GLUCOTROL XL) 10 MG 24 hr tablet Take 10 mg by mouth daily.  11 12/20/2014 at Unknown time  . hydrALAZINE (APRESOLINE) 25 MG tablet Take 25 mg by mouth 2 (two) times daily.  3 12/20/2014 at Unknown time  . iron polysaccharides (NIFEREX) 150 MG capsule Take 1 capsule (150 mg total) by mouth 2 (two)  times daily. 60 capsule 0 12/20/2014 at Unknown time  . isosorbide mononitrate (IMDUR) 30 MG 24 hr tablet Take 30 mg by mouth daily.  3 12/20/2014 at Unknown time  . methimazole (TAPAZOLE) 5 MG tablet Take 1 tablet (5 mg total) by mouth daily. (Patient taking differently: Take 5 mg by mouth 2 (two) times daily. ) 30 tablet 2 12/20/2014 at Unknown time  . metoprolol tartrate (LOPRESSOR) 25 MG tablet Take 1 tablet (25 mg total) by mouth 2 (two) times daily. 60 tablet 0 12/20/2014 at 530PM  . Multiple Vitamin (MULTIVITAMIN WITH MINERALS) TABS tablet Take 1 tablet by mouth daily.   12/20/2014 at Unknown time  . OXYGEN Inhale into the lungs.   12/21/2014 at Unknown time  . pantoprazole (PROTONIX) 40 MG tablet Take 1 tablet (40 mg total) by mouth daily. 30 tablet 0 12/20/2014 at  Unknown time  . potassium chloride SA (K-DUR,KLOR-CON) 20 MEQ tablet Take 1 tablet (20 mEq total) by mouth 2 (two) times daily. 60 tablet 2 12/20/2014 at Unknown time  . Pramoxine-Menthol (GOLD BOND MAXIMUM RELIEF) 1-1 % CREA Apply 1 application topically 2 (two) times daily as needed (for would care).   12/20/2014 at Unknown time  . torsemide (DEMADEX) 20 MG tablet Take 1 tablet (20 mg total) by mouth 2 (two) times daily. 60 tablet 2 12/20/2014 at Unknown time  . warfarin (COUMADIN) 2.5 MG tablet Take 1 tablet (2.5 mg total) by mouth daily. 10 tablet 0 12/20/2014 at 530PM    Assessment: Admitted with acute on chronic resp failure due to HF.  Pt is on warfarin  PTA 2.5 mg po daily. INR 2.3 > 3.2 > 4. No s/sx bleeding.  Goal of Therapy:  Heparin level 0.3-0.7 units/ml Monitor platelets by anticoagulation protocol: Yes   Plan:  -Hold warfarin -Daily INR   Agapito Games, PharmD, BCPS Clinical Pharmacist Pager: [phone] 12/23/2014 8:10 AM

## 2014-12-24 DIAGNOSIS — J9601 Acute respiratory failure with hypoxia: Secondary | ICD-10-CM

## 2014-12-24 DIAGNOSIS — I509 Heart failure, unspecified: Secondary | ICD-10-CM

## 2014-12-24 DIAGNOSIS — I10 Essential (primary) hypertension: Secondary | ICD-10-CM

## 2014-12-24 DIAGNOSIS — I480 Paroxysmal atrial fibrillation: Secondary | ICD-10-CM

## 2014-12-24 LAB — GLUCOSE, CAPILLARY
GLUCOSE-CAPILLARY: 259 mg/dL — AB (ref 65–99)
Glucose-Capillary: 275 mg/dL — ABNORMAL HIGH (ref 65–99)

## 2014-12-24 LAB — BASIC METABOLIC PANEL
ANION GAP: 6 (ref 5–15)
ANION GAP: 10 (ref 5–15)
BUN: 85 mg/dL — ABNORMAL HIGH (ref 6–20)
BUN: 81 mg/dL — ABNORMAL HIGH (ref 6–20)
CALCIUM: 9.3 mg/dL (ref 8.9–10.3)
CALCIUM: 9.4 mg/dL (ref 8.9–10.3)
CHLORIDE: 99 mmol/L — AB (ref 101–111)
CO2: 29 mmol/L (ref 22–32)
CO2: 29 mmol/L (ref 22–32)
CREATININE: 3 mg/dL — AB (ref 0.44–1.00)
Chloride: 95 mmol/L — ABNORMAL LOW (ref 101–111)
Creatinine, Ser: 3.04 mg/dL — ABNORMAL HIGH (ref 0.44–1.00)
GFR calc Af Amer: 16 mL/min — ABNORMAL LOW (ref >60)
GFR calc Af Amer: 16 mL/min — ABNORMAL LOW (ref >60)
GFR calc non Af Amer: 14 mL/min — ABNORMAL LOW (ref >60)
GFR, EST NON AFRICAN AMERICAN: 14 mL/min — AB (ref >60)
GLUCOSE: 377 mg/dL — AB (ref 65–99)
Glucose, Bld: 325 mg/dL — ABNORMAL HIGH (ref 65–99)
POTASSIUM: 5.6 mmol/L — AB (ref 3.5–5.1)
Potassium: 5.3 mmol/L — ABNORMAL HIGH (ref 3.5–5.1)
SODIUM: 134 mmol/L — AB (ref 135–145)
Sodium: 134 mmol/L — ABNORMAL LOW (ref 135–145)

## 2014-12-24 LAB — CBC
HCT: 25.4 % — ABNORMAL LOW (ref 36.0–46.0)
Hemoglobin: 7.7 g/dL — ABNORMAL LOW (ref 12.0–15.0)
MCH: 25.8 pg — ABNORMAL LOW (ref 26.0–34.0)
MCHC: 30.3 g/dL (ref 30.0–36.0)
MCV: 84.9 fL (ref 78.0–100.0)
Platelets: 390 10*3/uL (ref 150–400)
RBC: 2.99 MIL/uL — ABNORMAL LOW (ref 3.87–5.11)
RDW: 18.3 % — AB (ref 11.5–15.5)
WBC: 7.8 10*3/uL (ref 4.0–10.5)

## 2014-12-24 LAB — PROTIME-INR
INR: 2.98 — ABNORMAL HIGH (ref 0.00–1.49)
Prothrombin Time: 30.4 seconds — ABNORMAL HIGH (ref 11.6–15.2)

## 2014-12-24 MED ORDER — ALBUTEROL SULFATE (2.5 MG/3ML) 0.083% IN NEBU: 2.5000 mg | INHALATION_SOLUTION | Freq: Four times a day (QID) | RESPIRATORY_TRACT | Status: DC | PRN

## 2014-12-24 MED ORDER — IPRATROPIUM-ALBUTEROL 0.5-2.5 (3) MG/3ML IN SOLN: 3.0000 mL | Freq: Four times a day (QID) | RESPIRATORY_TRACT | Status: DC | PRN

## 2014-12-24 MED ORDER — TORSEMIDE 20 MG PO TABS
40.0000 mg | ORAL_TABLET | Freq: Two times a day (BID) | ORAL | Status: DC
  Administered 2014-12-24: 40 mg via ORAL
  Filled 2014-12-24: qty 2

## 2014-12-24 MED ORDER — TORSEMIDE 20 MG PO TABS: 40.0000 mg | ORAL_TABLET | Freq: Two times a day (BID) | ORAL | Status: DC

## 2014-12-24 MED ORDER — PREDNISONE 20 MG PO TABS: 40.0000 mg | ORAL_TABLET | Freq: Every day | ORAL | Status: DC

## 2014-12-24 MED ORDER — POTASSIUM CHLORIDE CRYS ER 20 MEQ PO TBCR: 20.0000 meq | EXTENDED_RELEASE_TABLET | Freq: Every day | ORAL | Status: DC

## 2014-12-24 NOTE — Progress Notes (Signed)
ANTICOAGULATION CONSULT NOTE  Pharmacy Consult for warfarin Indication: atrial fibrillation  Allergies  Allergen Reactions  . Other     novocaine broke mouth out  . Codeine Hives  . Penicillins Hives  . Procaine Hives  . Yellow Dyes (Non-Tartrazine) Nausea And Vomiting    "deathly allergic" No other information given to explain reaction.    Patient Measurements: Weight: 149 lb 8 oz (67.813 kg) Heparin Dosing Weight:   Vital Signs: Temp: 97.7 F (36.5 C) (07/30 0400) Temp Source: Oral (07/30 0400) BP: 140/44 mmHg (07/30 0400) Pulse Rate: 50 (07/30 0400)  Labs:  Recent Labs  12/21/14 0745  12/22/14 0408 12/23/14 0450 12/24/14 0259  HGB 8.8*  --  7.8* 7.8* 7.7*  HCT 28.8*  --  25.6* 25.3* 25.4*  PLT 424*  --  361 376 390  LABPROT  --   < > 32.2* 38.4* 30.4*  INR  --   < > 3.22* 4.06* 2.98*  CREATININE 2.95*  --  2.69* 2.97* 3.04*  TROPONINI <0.03  --   --   --   --   < > = values in this interval not displayed.  Estimated Creatinine Clearance: 13.3 mL/min (by C-G formula based on Cr of 3.04).   Medical History: Past Medical History  Diagnosis Date  . Hypertension   . Neuropathy   . Anxiety   . PONV (postoperative nausea and vomiting)   . CHF (congestive heart failure)   . COPD (chronic obstructive pulmonary disease)     on home, nocturnal oxygen.   . Family history of adverse reaction to anesthesia   . Cardiomyopathy   . Diabetes mellitus without complication     TYPE 2  . Macular degeneration, bilateral   . Varicose veins   . GIB (gastrointestinal bleeding)   . Paroxysmal atrial fibrillation   . Shortness of breath dyspnea   . Acute on chronic diastolic heart failure 12/22/2014    Medications:  Prescriptions prior to admission  Medication Sig Dispense Refill Last Dose  . acetaminophen (TYLENOL) 325 MG tablet Take 650 mg by mouth every 6 (six) hours as needed for mild pain.    Past Month at Unknown time  . albuterol (PROVENTIL HFA;VENTOLIN HFA) 108  (90 BASE) MCG/ACT inhaler Inhale 2 puffs into the lungs every 6 (six) hours as needed for wheezing or shortness of breath. 1 Inhaler 3 12/20/2014 at Unknown time  . atorvastatin (LIPITOR) 20 MG tablet Take 20 mg by mouth at bedtime.   12/20/2014 at Unknown time  . cholecalciferol (VITAMIN D) 1000 UNITS tablet Take 1,000 Units by mouth at bedtime.    12/20/2014 at Unknown time  . erythromycin ophthalmic ointment Place into the right eye every 6 (six) hours. Use 4 times daily until 12/21/14, then STOP. DO NOT USE UNTIL EMPTY. 3.5 g 0 12/20/2014 at Unknown time  . escitalopram (LEXAPRO) 10 MG tablet Take 10 mg by mouth daily.   12/20/2014 at Unknown time  . fenofibrate 160 MG tablet Take 160 mg by mouth daily.   12/20/2014 at Unknown time  . gabapentin (NEURONTIN) 100 MG capsule Take 100-200 mg by mouth 2 (two) times daily. Take 1 capsule (100 mg) morning, take 2 capsules (200 mg) at bedtime   12/20/2014 at Unknown time  . glipiZIDE (GLUCOTROL XL) 10 MG 24 hr tablet Take 10 mg by mouth daily.  11 12/20/2014 at Unknown time  . hydrALAZINE (APRESOLINE) 25 MG tablet Take 25 mg by mouth 2 (two) times daily.  3  12/20/2014 at Unknown time  . iron polysaccharides (NIFEREX) 150 MG capsule Take 1 capsule (150 mg total) by mouth 2 (two) times daily. 60 capsule 0 12/20/2014 at Unknown time  . isosorbide mononitrate (IMDUR) 30 MG 24 hr tablet Take 30 mg by mouth daily.  3 12/20/2014 at Unknown time  . methimazole (TAPAZOLE) 5 MG tablet Take 1 tablet (5 mg total) by mouth daily. (Patient taking differently: Take 5 mg by mouth 2 (two) times daily. ) 30 tablet 2 12/20/2014 at Unknown time  . metoprolol tartrate (LOPRESSOR) 25 MG tablet Take 1 tablet (25 mg total) by mouth 2 (two) times daily. 60 tablet 0 12/20/2014 at 530PM  . Multiple Vitamin (MULTIVITAMIN WITH MINERALS) TABS tablet Take 1 tablet by mouth daily.   12/20/2014 at Unknown time  . OXYGEN Inhale into the lungs.   12/21/2014 at Unknown time  . pantoprazole (PROTONIX) 40 MG  tablet Take 1 tablet (40 mg total) by mouth daily. 30 tablet 0 12/20/2014 at Unknown time  . potassium chloride SA (K-DUR,KLOR-CON) 20 MEQ tablet Take 1 tablet (20 mEq total) by mouth 2 (two) times daily. 60 tablet 2 12/20/2014 at Unknown time  . Pramoxine-Menthol (GOLD BOND MAXIMUM RELIEF) 1-1 % CREA Apply 1 application topically 2 (two) times daily as needed (for would care).   12/20/2014 at Unknown time  . torsemide (DEMADEX) 20 MG tablet Take 1 tablet (20 mg total) by mouth 2 (two) times daily. 60 tablet 2 12/20/2014 at Unknown time  . warfarin (COUMADIN) 2.5 MG tablet Take 1 tablet (2.5 mg total) by mouth daily. 10 tablet 0 12/20/2014 at 530PM    Assessment: Admitted with acute on chronic resp failure due to HF.  Pt is on warfarin  PTA 2.5 mg po daily. INR jumped to 4 yesterday, now down to 2.98. No s/sx bleeding.  Goal of Therapy:  Heparin level 0.3-0.7 units/ml Monitor platelets by anticoagulation protocol: Yes   Plan:  -Warfarin 1 mg po x1 -Daily INR -Recommend discontinuing Kdur, given potassium = 5.6 today -Pt will likely require reduced home warfarin regimen   Agapito Games, PharmD, BCPS Clinical Pharmacist Pager: [phone] 12/24/2014 7:39 AM

## 2014-12-24 NOTE — Progress Notes (Signed)
Subjective: No acute events overnight. Doing well overall. Denies dyspnea or CP. Tolerating PO intake without nausea/vomiting.  Objective: Vital signs in last 24 hours: Filed Vitals:   12/23/14 2009 12/24/14 0400 12/24/14 0821 12/24/14 0938  BP: 138/45 140/44 115/50   Pulse: 51 50 52   Temp: 98.1 F (36.7 C) 97.7 F (36.5 C)    TempSrc: Oral Oral    Resp: 16 16    Height:    (1.575 m)   Weight:  149 lb 8 oz (67.813 kg)    SpO2: 99% 100%  100%   Weight change: -1 lb 2.2 oz (-0.517 kg)  Intake/Output Summary (Last 24 hours) at 12/24/14 1010 Last data filed at 12/24/14 0830  Gross per 24 hour  Intake    840 ml  Output    325 ml  Net    515 ml   General Apperance: NAD HEENT: Normocephalic, atraumatic, PERRL, EOMI, anicteric sclera Neck: Supple, trachea midline Lungs: CTAB. No wheezes, rhonchi. Breathing comfortably on [place] Heart: Regular rate and rhythm, no murmur/rub/gallop Abdomen: Soft, nontender, nondistended, no rebound/guarding Extremities: Normal, atraumatic, warm and well perfused, trace edema Pulses: 2+ throughout Skin: No rashes or lesions Neurologic: Alert and oriented x 3. CNII-XII intact. Normal strength and sensation  Lab Results: Basic Metabolic Panel:  Recent Labs Lab 12/23/14 0450 12/24/14 0259  NA 135 134*  K 4.9 5.6*  CL 99* 99*  CO2 28 29  GLUCOSE 309* 377*  BUN 71* 85*  CREATININE 2.97* 3.04*  CALCIUM 9.1 9.3  MG 2.4  --   PHOS 4.3  --      CBC:  Recent Labs Lab 12/21/14 0745  12/23/14 0450 12/24/14 0259  WBC 10.3  < > 10.2 7.8  NEUTROABS 8.5*  --   --   --   HGB 8.8*  < > 7.8* 7.7*  HCT 28.8*  < > 25.3* 25.4*  MCV 87.5  < > 85.8 84.9  PLT 424*  < > 376 390  < > = values in this interval not displayed. Cardiac Enzymes:  Recent Labs Lab 12/21/14 0745  TROPONINI <0.03   BNP (last 3 results)  Recent Labs  12/13/14 1934 12/17/14 0250 12/21/14 0745  BNP 581.2* 759.7* 557.5*   CBG:  Recent Labs Lab  12/22/14 2141 12/23/14 0726 12/23/14 1118 12/23/14 1647 12/23/14 2108 12/24/14 0734  GLUCAP 325* 287* 158* 334* 498* 275*   Thyroid Function Tests: No results for input(s): TSH, T4TOTAL, FREET4, T3FREE, THYROIDAB in the last 168 hours. Coagulation:  Recent Labs Lab 12/21/14 0914 12/22/14 0408 12/23/14 0450 12/24/14 0259  LABPROT 25.7* 32.2* 38.4* 30.4*  INR 2.38* 3.22* 4.06* 2.98*   Micro Results: Recent Results (from the past 240 hour(s))  Culture, blood (routine x 2)     Status: None (Preliminary result)   Collection Time: 12/21/14  7:45 AM  Result Value Ref Range Status   Specimen Description BLOOD LEFT ARM  Final   Special Requests BOTTLES DRAWN AEROBIC AND ANAEROBIC 5CC  Final   Culture NO GROWTH 2 DAYS  Final   Report Status PENDING  Incomplete  Culture, blood (routine x 2)     Status: None (Preliminary result)   Collection Time: 12/21/14  9:15 AM  Result Value Ref Range Status   Specimen Description BLOOD RIGHT ARM  Final   Special Requests BOTTLES DRAWN AEROBIC AND ANAEROBIC 3CC  Final   Culture NO GROWTH 2 DAYS  Final   Report Status PENDING  Incomplete  MRSA PCR Screening     Status: None   Collection Time: 12/21/14  1:22 PM  Result Value Ref Range Status   MRSA by PCR NEGATIVE NEGATIVE Final    Comment:        The GeneXpert MRSA Assay (FDA approved for NASAL specimens only), is one component of a comprehensive MRSA colonization surveillance program. It is not intended to diagnose MRSA infection nor to guide or monitor treatment for MRSA infections.    Studies/Results: No results found. Medications: I have reviewed the patient's current medications. Scheduled Meds: . atorvastatin  20 mg Oral QHS  . chlorhexidine  15 mL Mouth Rinse BID  . cholecalciferol  1,000 Units Oral QHS  . escitalopram  10 mg Oral Daily  . fenofibrate  160 mg Oral Daily  . gabapentin  100 mg Oral q morning - 10a   And  . gabapentin  200 mg Oral QHS  . hydrALAZINE  25 mg  Oral BID  . insulin aspart  0-20 Units Subcutaneous TID WC  . insulin aspart  0-5 Units Subcutaneous QHS  . insulin glargine  15 Units Subcutaneous QHS  . iron polysaccharides  150 mg Oral BID  . isosorbide mononitrate  30 mg Oral Daily  . levalbuterol  0.63 mg Nebulization TID  . methimazole  5 mg Oral Daily  . metoprolol tartrate  25 mg Oral BID  . multivitamin with minerals  1 tablet Oral Daily  . pantoprazole  40 mg Oral Daily  . predniSONE  40 mg Oral Q breakfast  . sodium chloride  3 mL Intravenous Q12H  . torsemide  40 mg Oral BID  . Warfarin - Pharmacist Dosing Inpatient   Does not apply q1800   Continuous Infusions:  PRN Meds:.acetaminophen, albuterol Assessment/Plan:  Acute on chronic respiratory failure with hypoxia due to acute on chronic (HFpEF) heart failure with preserved ejection fraction: Received solumedrol 125mg  x 1 hospital day 1. Off of BiPAP. Procalcitonin negative making bacterial pulmonary infection unlikely. Repeat CXR 2v with mild cardiomegaly with bibasilar airspace opacity and interval clearing of interstitial edema from the upper lobes compared to 1 day prior. No new opacity. Now down to 3 L [place]. Creatinine 3.04 today from 2.97. Diuresis held yesterday. - Cardiology following, appreciate recommendations - Start Torsemide 40mg  BID today - Neb treatments scheduled q4h - Continue prednisone 40mg  daily for 5 days of steroids (end after December 25, 2014). - Potassium elevated to 5.6, no EKG changes. Will recheck BMP this afternoon.  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 May have to tolerate higher baseline creatinine per cardiology. -Continue to monitor  Hypertension: Continue home hydralazine, imdur and metoprolol.  Type 2 diabetes mellitus, uncontrolled - Hold home glipizide - Lantus 15 u QHS - SSI-R  COPD (chronic obstructive pulmonary disease): -Plan as above  Paroxysmal atrial fibrillation:  On warfarin.  Hyperthyroidism: Continue home methimazole  Iron Deficiency Anemia: Denies current melna or bloody stools. Hb stable, 7.8 today.  -Continue iron 150 mg bid -Continue to monitor  VTE ppx: Coumadin  Dispo: Likely home today.  The patient does have a current PCP Elizabeth Palau, FNP) and does not need an [facility] hospital follow-up appointment after discharge.  The patient does not have transportation limitations that hinder transportation to clinic appointments.  .Services Needed at time of discharge: Y = Yes, Blank = No PT:   OT:   RN:   Equipment:   Other:     LOS:  3 days   Lora Paula, MD 12/24/2014, 10:10 AM

## 2014-12-24 NOTE — Progress Notes (Addendum)
Occupational Therapy Treatment Patient Details Name: Alexis [name] MRN: [id number] DOB: 06/22/1931 Today's Date: 12/24/2014    History of present illness Alexis [name] is a 81 y.o. F with PMH of dCHF, paroxsymal Afib, HTN, COPD on chronic home O2, and DM who returns to the ED today with complaints of increasing dyspnea. She was discharged from the hospital on Sunday the 23rd after treatment for acute CHF exacerbation.   OT comments  Education provided in session and energy conservation handout given. Feel pt is safe to d/c home, from OT standpoint.   Follow Up Recommendations  No OT follow up;Supervision - Intermittent    Equipment Recommendations  None recommended by OT    Recommendations for Other Services      Precautions / Restrictions Precautions Precautions: Fall Restrictions Weight Bearing Restrictions: No       Mobility Bed Mobility               General bed mobility comments: not assessed  Transfers Overall transfer level: Needs assistance   Transfers: Sit to/from Stand; Stand pivot Stand pivot: Supervision Sit to Stand: Supervision         General transfer comment: cue for hand placement    Balance    No physical assistance needed for balance.                                ADL Overall ADL's : Needs assistance/impaired     Grooming: Wash/dry hands;Brushing hair;Set up; Supervision/safety; Standing (items already at sink; cue to locate soap)                   Toilet Transfer: Supervision/safety;Ambulation; BSC; stand pivot           Functional mobility during ADLs: Supervision/safety;Rolling walker-cues to side step in tight area General ADL Comments: Educated on energy conservation techniques.       Vision                     Perception     Praxis      Cognition  Awake/Alert Behavior During Therapy: WFL for tasks assessed/performed Overall Cognitive Status: Within Functional Limits  for tasks assessed       Memory: Decreased short-term memory               Extremity/Trunk Assessment               Exercises     Shoulder Instructions       General Comments      Pertinent Vitals/ Pain       Pain Assessment: No/denies pain; O2 at 98% towards end of session with pt on O2.   Home Living                                          Prior Functioning/Environment              Frequency Min 2X/week     Progress Toward Goals  OT Goals(current goals can now be found in the care plan section)  Progress towards OT goals: Goals met/education completed, patient discharged from OT Acute Rehab OT Goals Patient Stated Goal: go home OT Goal Formulation: With patient Time For Goal Achievement: 12/30/14 Potential to Achieve Goals: Good ADL Goals Additional ADL Goal #1: Pt will verbalize  understanding of use of energy conservation techniques for ADL   Plan Discharge plan remains appropriate    Co-evaluation                 End of Session Equipment Utilized During Treatment: Gait belt;Oxygen;Rolling walker   Activity Tolerance Patient tolerated treatment well   Patient Left in chair;with call bell/phone within reach;with family/visitor present (MD? in room)   Nurse Communication          Time: 0970-4492 OT Time Calculation (min): 13 min  Charges: OT General Charges $OT Visit: 1 Procedure OT Treatments $Self Care/Home Management : 8-22 mins  Benito Mccreedy OTR/L [phone] 12/24/2014, 11:21 AM

## 2014-12-24 NOTE — Progress Notes (Addendum)
    Subjective:  Feeling much better. Breathing improved. No chest pain.   Marland Kitchen atorvastatin  20 mg Oral QHS  . chlorhexidine  15 mL Mouth Rinse BID  . cholecalciferol  1,000 Units Oral QHS  . escitalopram  10 mg Oral Daily  . fenofibrate  160 mg Oral Daily  . gabapentin  100 mg Oral q morning - 10a   And  . gabapentin  200 mg Oral QHS  . hydrALAZINE  25 mg Oral BID  . insulin aspart  0-20 Units Subcutaneous TID WC  . insulin aspart  0-5 Units Subcutaneous QHS  . insulin glargine  15 Units Subcutaneous QHS  . iron polysaccharides  150 mg Oral BID  . isosorbide mononitrate  30 mg Oral Daily  . levalbuterol  0.63 mg Nebulization TID  . methimazole  5 mg Oral Daily  . metoprolol tartrate  25 mg Oral BID  . multivitamin with minerals  1 tablet Oral Daily  . pantoprazole  40 mg Oral Daily  . predniSONE  40 mg Oral Q breakfast  . sodium chloride  3 mL Intravenous Q12H  . torsemide  40 mg Oral BID  . Warfarin - Pharmacist Dosing Inpatient   Does not apply q1800     Objective:  Vital Signs in the last 24 hours: Temp:  [97.7 F (36.5 C)-98.8 F (37.1 C)] 97.7 F (36.5 C) (07/30 0400) Pulse Rate:  [50-64] 52 (07/30 1046) Resp:  [16] 16 (07/30 0400) BP: (115-140)/(42-50) 132/42 mmHg (07/30 1046) SpO2:  [99 %-100 %] 100 % (07/30 0938) Weight:  [149 lb 8 oz (67.813 kg)] 149 lb 8 oz (67.813 kg) (07/30 0400)  Intake/Output from previous day: 07/29 0701 - 07/30 0700 In: 480 [P.O.:480] Out: 400 [Urine:400]  Physical Exam: Pt is alert and oriented, pleasant elderly woman in NAD HEENT: normal Neck: JVP - normal Lungs: Coarse bilaterally but improved aeration CV: RRR without murmur or gallop Abd: soft, NT, Positive BS, no hepatomegaly Ext: no C/C/E, distal pulses intact and equal Skin: warm/dry no rash   Lab Results:  Recent Labs  12/23/14 0450 12/24/14 0259  WBC 10.2 7.8  HGB 7.8* 7.7*  PLT 376 390    Recent Labs  12/23/14 0450 12/24/14 0259  NA 135 134*  K 4.9  5.6*  CL 99* 99*  CO2 28 29  GLUCOSE 309* 377*  BUN 71* 85*  CREATININE 2.97* 3.04*   Tele: Personally reviewed: Normal sinus rhythm   Assessment/Plan:   1. Acute on chronic respiratory failure, improved 2. Acute on chronic diastolic heart failure, improved 3. Acute on chronic kidney injury, creatinine back up today 4. Malignant hypertension with congestive heart failure - blood pressure now well controlled  The patient is clinically improved. Started on torsemide 40 mg twice daily this am which is double her previous home dose. She will require close clinical follow-up considering her frequent hospitalizations admissions for acute heart failure.  She will be discharged today, we will arrange a follow up with Dr [name].   Lars Masson, M.D. 12/24/2014, 11:03 AM

## 2014-12-24 NOTE — Care Management Note (Signed)
Case Management Note  Patient Details  Name: Alexis [name] MRN: [id number] Date of Birth: 12/11/1931  Subjective/Objective:                   Acute on Chronic Respiratory Failure Action/Plan: Discharge planning  Expected Discharge Date:  12/24/14                Expected Discharge Plan:  Home w Home Health Services  In-House Referral:     Discharge planning Services  CM Consult  Post Acute Care Choice:  Home Health, Resumption of Svcs/PTA Provider Choice offered to:  Patient  DME Arranged:  Nebulizer machine DME Agency:     HH Arranged:  RN, PT HH Agency:  [facility]  Status of Service:  Completed, signed off  Medicare Important Message Given:    Date Medicare IM Given:    Medicare IM give by:    Date Additional Medicare IM Given:    Additional Medicare Important Message give by:     If discussed at Long Length of Stay Meetings, dates discussed:    Additional Comments: CM met with pt in room to see if she had a nebulizer machine at home.  MD has called in prescriptions for neb treatme4nts.  CM requested order for Neb machine.  Order placed.  CM called [facility] DME rep, James to please deliver the neb machine so pt can be discharged.  CM called [facility] rep, Tiffany to notify of discharge today.  No other CM needs were communicated. ,  [place], RN 12/24/2014, 2:58 PM  

## 2014-12-25 NOTE — Discharge Summary (Signed)
Name: Alexis [name] MRN: [id number] DOB: [birth year] 81 y.o. PCP: Elizabeth Palau, FNP  Date of Admission: 12/21/2014  7:30 AM Date of Discharge: 12/24/2014 Attending Physician: Earl Lagos, MD  Discharge Diagnosis: Principal Problem:   Acute on chronic respiratory failure with hypoxia Active Problems:   Type 2 diabetes mellitus, uncontrolled   (HFpEF) heart failure with preserved ejection fraction   COPD (chronic obstructive pulmonary disease)  Discharge Medications:   Medication List    STOP taking these medications        erythromycin ophthalmic ointment      TAKE these medications        acetaminophen 325 MG tablet  Commonly known as:  TYLENOL  Take 650 mg by mouth every 6 (six) hours as needed for mild pain.     albuterol 108 (90 BASE) MCG/ACT inhaler  Commonly known as:  PROVENTIL HFA;VENTOLIN HFA  Inhale 2 puffs into the lungs every 6 (six) hours as needed for wheezing or shortness of breath.     albuterol (2.5 MG/3ML) 0.083% nebulizer solution  Commonly known as:  PROVENTIL  Take 3 mLs (2.5 mg total) by nebulization every 6 (six) hours as needed for wheezing or shortness of breath.     cholecalciferol 1000 UNITS tablet  Commonly known as:  VITAMIN D  Take 1,000 Units by mouth at bedtime.     escitalopram 10 MG tablet  Commonly known as:  LEXAPRO  Take 10 mg by mouth daily.     fenofibrate 160 MG tablet  Take 160 mg by mouth daily.     gabapentin 100 MG capsule  Commonly known as:  NEURONTIN  Take 100-200 mg by mouth 2 (two) times daily. Take 1 capsule (100 mg) morning, take 2 capsules (200 mg) at bedtime     glipiZIDE 10 MG 24 hr tablet  Commonly known as:  GLUCOTROL XL  Take 10 mg by mouth daily.     GOLD BOND MAXIMUM RELIEF 1-1 % Crea  Generic drug:  Pramoxine-Menthol  Apply 1 application topically 2 (two) times daily as needed (for would care).     hydrALAZINE 25 MG tablet  Commonly known as:  APRESOLINE  Take 25 mg by mouth 2  (two) times daily.     iron polysaccharides 150 MG capsule  Commonly known as:  NIFEREX  Take 1 capsule (150 mg total) by mouth 2 (two) times daily.     isosorbide mononitrate 30 MG 24 hr tablet  Commonly known as:  IMDUR  Take 30 mg by mouth daily.     LIPITOR 20 MG tablet  Generic drug:  atorvastatin  Take 20 mg by mouth at bedtime.     methimazole 5 MG tablet  Commonly known as:  TAPAZOLE  Take 1 tablet (5 mg total) by mouth daily.     metoprolol tartrate 25 MG tablet  Commonly known as:  LOPRESSOR  Take 1 tablet (25 mg total) by mouth 2 (two) times daily.     multivitamin with minerals Tabs tablet  Take 1 tablet by mouth daily.     OXYGEN  Inhale into the lungs.     pantoprazole 40 MG tablet  Commonly known as:  PROTONIX  Take 1 tablet (40 mg total) by mouth daily.     potassium chloride SA 20 MEQ tablet  Commonly known as:  K-DUR,KLOR-CON  Take 1 tablet (20 mEq total) by mouth daily.     predniSONE 20 MG tablet  Commonly known as:  DELTASONE  Take 2 tablets (40 mg total) by mouth daily with breakfast.     torsemide 20 MG tablet  Commonly known as:  DEMADEX  Take 2 tablets (40 mg total) by mouth 2 (two) times daily.     warfarin 2.5 MG tablet  Commonly known as:  COUMADIN  Take 1 tablet (2.5 mg total) by mouth daily.        Disposition and follow-up:   Ms.Robertta E Schlossberg was discharged from [facility] in Stable condition.  At the hospital follow up visit please address:  1. Acute on chronic respiratory failure with hypoxia due to acute on chronic (HFpEF) heart failure with preserved ejection fraction: May have to tolerate higher baseline creatinine per cardiology. She was started on torsemide 40mg  BID. Potassium elevated to 5.6, no EKG changes. Recheck after starting torsemide 40mg  BID was 5.3.   2.  Labs / imaging needed at time of follow-up: BMP  3.  Pending labs/ test needing follow-up: None  Follow-up Appointments:       Follow-up Information    Schedule an appointment as soon as possible for a visit with Vicenta Aly, [place].   Specialty:  Nurse Practitioner   Why:  Please call to set up a hospital follow up appointment within a week.   Contact information:   [place] [address] [phone]       Follow up with Pixie Casino, MD.   Specialty:  Cardiology   Why:  Our office will call you with a follow-up appointment   Contact information:   [place] [address] [phone]       Follow up with [facility].   Why:  home health services and nebulizer machine.   Contact information:   [address] [place] [address] [phone]       Discharge Instructions: Discharge Instructions    DME Nebulizer machine    Complete by:  As directed            Consultations: Treatment Team:  R[facility], MD  Procedures Performed:  X-ray Chest Pa And Lateral  12/22/2014   CLINICAL DATA:  Hypoxia  EXAM: CHEST  2 VIEW  COMPARISON:  December 21, 2014  FINDINGS: There is persistent patchy airspace disease in the lower lobes. There has been clearing of interstitial edema from the upper lobes. No new opacity. Heart is borderline enlarged with pulmonary vascularity within normal limits. No adenopathy. There is degenerative change in the thoracic spine.  IMPRESSION: Mild cardiomegaly with bibasilar airspace opacity. Suspect edema, although superimposed pneumonia in the lower lobes cannot be excluded. There has been interval clearing of interstitial edema from the upper lobes compared to 1 day prior. No new opacity.   Electronically Signed   By: Lowella Grip III M.D.   On: 12/22/2014 08:04   Dg Chest 2 View  11/29/2014   CLINICAL DATA:  Shortness of breath.  EXAM: CHEST  2 VIEW  COMPARISON:  11/03/2014 .  FINDINGS: Cardiomegaly with pulmonary vascular prominence and diffuse interstitial prominence with small right  pleural effusion. These findings consistent with congestive heart failure .  IMPRESSION: Congestive heart failure with pulmonary interstitial edema and small right pleural effusion. Pulmonary interstitial edema has progressed from prior study of 11/03/2014.   Electronically Signed   By: Marcello Moores  Register   On: 11/29/2014 14:50   Dg Chest Portable 1 View  12/21/2014   CLINICAL DATA:  Acute shortness  of breath.  EXAM: PORTABLE CHEST - 1 VIEW  COMPARISON:  December 13, 2014.  FINDINGS: Stable cardiomegaly. No pneumothorax is noted. Minimal right pleural effusion is noted. Bilateral perihilar and basilar opacities are noted concerning for edema or pneumonia. Bony thorax is intact.  IMPRESSION: Bilateral perihilar and basilar opacities are noted concerning for edema or pneumonia. Minimal right pleural effusion is noted.   Electronically Signed   By: Marijo Conception, M.D.   On: 12/21/2014 08:06   Dg Chest Portable 1 View  12/13/2014   CLINICAL DATA:  Worsening shortness of breath, chest tightness  EXAM: PORTABLE CHEST - 1 VIEW  COMPARISON:  11/29/2014  FINDINGS: Patchy airspace opacities throughout both lungs involving bases more than apices, increased since previous exam. There is blunting of the right lateral costophrenic angle suggesting small effusion. Mild cardiomegaly stable. Multiple old left rib fractures. No pneumothorax.  IMPRESSION: 1. Progressive bilateral edema/infiltrates with probable small right effusion.   Electronically Signed   By: Lucrezia Europe M.D.   On: 12/13/2014 20:32    Admission HPI: RICHEL MILLSPAUGH is a 81 yo F with PMH of dCHF, paroxsymal Afib, HTN, COPD on chronic home O2, and DM who returns to the ED today with complaints of increasing dyspnea. She was discharged from the hospital on Sunday the 23rd after treatment for acute CHF exacerbation. On Monday she went to see Dr Ashok Cordia (Pulmonology) for further evaluation of her hypoxia. At that visit she was still on 4L of supplemental O2  via [place] with O2 sat of 97% and her hypoxia was felt to be mostly related to heart failure and PAF but PFTs were ordered to further evaluate her COPD. She reports that this morning she felt that she was more short of breath especially with any activity or lying flat. She does report compliance with all of her medications including torsemide. She denies any fever/chills. She has a very mild non productive cough.  Hospital Course by problem list:  1. Acute on chronic respiratory failure with hypoxia due to acute on chronic (HFpEF) heart failure with preserved ejection fraction: Patient presents for recurrent admission again with hypoxic respiratory failure. On exam she does have some bibasilar rales, and lower extremity edema. Her weight is up only 2 lbs but this is near the weight she presented at last admission, her BNP is elevated at 555 but is lower than the last admission.Concern that her primary issue this admission is mild hypervolemia on top of other pulmonary issues.Possibility of a superimposed PNA brought up by the ED providers, and continued empiric IV antibiotics since there was concern initially for hypothermia.She was admitted to telemetry and started on IV lasix $Remove'80mg'iairmJD$  BID. Neb treatments scheduled q6hr. She was seen in consultation by cardiology. Desaturation to 70s while on bedpan afternoon of hospital day 1. She was placed on BiPAP with improvement in respiratory status. She was also given solumedrol $RemoveBeforeDE'125mg'tkHBHwHPWjQbZsn$  once and increased scheduled neb treatments to q4hr. Transitioned to 6L [place] the following morning. Procalcitonin negative making bacterial pulmonary infection unlikely. Repeat CXR 2v with mild cardiomegaly with bibasilar airspace opacity and interval clearing of interstitial edema from the upper lobes compared to 1 day prior. No new opacity. Antibiotics were discontinued. She was transitioned to prednisone $RemoveBefor'40mg'RGitHZhJEVBH$  daily to complete 5 days of steroids (end after July 31). Her supplemental  oxygen was weaned down to her home 3L [place]. She had worsening renal function on 12/23/2014. Diuresis was held. Creatinine the following day was 3.04. May have  to tolerate higher baseline creatinine per cardiology. She was started on torsemide 40mg  BID. Potassium elevated to 5.6, no EKG changes. Recheck after starting torsemide 40mg  BID was 5.3. By discharge, she had resolution of her symptoms.    Discharge Vitals:   BP 141/54 mmHg  Pulse 60  Temp(Src) 98.2 F (36.8 C) (Oral)  Resp 18  Ht 5\' 2"  (1.575 m)  Wt 149 lb 8 oz (67.813 kg)  BMI 27.34 kg/m2  SpO2 100%  Discharge Labs:  Results for orders placed or performed during the hospital encounter of 12/21/14 (from the past 72 hour(s))  Protime-INR     Status: Abnormal   Collection Time: 12/23/14  4:50 AM  Result Value Ref Range   Prothrombin Time 38.4 (H) 11.6 - 15.2 seconds   INR 4.06 (H) 0.00 - 1.49  Procalcitonin     Status: None   Collection Time: 12/23/14  4:50 AM  Result Value Ref Range   Procalcitonin 0.35 ng/mL    Comment:        Interpretation: PCT (Procalcitonin) <= 0.5 ng/mL: Systemic infection (sepsis) is not likely. Local bacterial infection is possible. (NOTE)         ICU PCT Algorithm               Non ICU PCT Algorithm    ----------------------------     ------------------------------         PCT < 0.25 ng/mL                 PCT < 0.1 ng/mL     Stopping of antibiotics            Stopping of antibiotics       strongly encouraged.               strongly encouraged.    ----------------------------     ------------------------------       PCT level decrease by               PCT < 0.25 ng/mL       >= 80% from peak PCT       OR PCT 0.25 - 0.5 ng/mL          Stopping of antibiotics                                             encouraged.     Stopping of antibiotics           encouraged.    ----------------------------     ------------------------------       PCT level decrease by              PCT >= 0.25 ng/mL       <  80% from peak PCT        AND PCT >= 0.5 ng/mL            Continuin g antibiotics                                              encouraged.       Continuing antibiotics            encouraged.    ----------------------------     ------------------------------     PCT  level increase compared          PCT > 0.5 ng/mL         with peak PCT AND          PCT >= 0.5 ng/mL             Escalation of antibiotics                                          strongly encouraged.      Escalation of antibiotics        strongly encouraged.   Basic metabolic panel     Status: Abnormal   Collection Time: 12/23/14  4:50 AM  Result Value Ref Range   Sodium 135 135 - 145 mmol/L    Comment: DELTA CHECK NOTED   Potassium 4.9 3.5 - 5.1 mmol/L   Chloride 99 (L) 101 - 111 mmol/L   CO2 28 22 - 32 mmol/L   Glucose, Bld 309 (H) 65 - 99 mg/dL   BUN 71 (H) 6 - 20 mg/dL   Creatinine, Ser 2.97 (H) 0.44 - 1.00 mg/dL   Calcium 9.1 8.9 - 10.3 mg/dL   GFR calc non Af Amer 14 (L) >60 mL/min   GFR calc Af Amer 16 (L) >60 mL/min    Comment: (NOTE) The eGFR has been calculated using the CKD EPI equation. This calculation has not been validated in all clinical situations. eGFR's persistently <60 mL/min signify possible Chronic Kidney Disease.    Anion gap 8 5 - 15  Magnesium     Status: None   Collection Time: 12/23/14  4:50 AM  Result Value Ref Range   Magnesium 2.4 1.7 - 2.4 mg/dL  Phosphorus     Status: None   Collection Time: 12/23/14  4:50 AM  Result Value Ref Range   Phosphorus 4.3 2.5 - 4.6 mg/dL  CBC     Status: Abnormal   Collection Time: 12/23/14  4:50 AM  Result Value Ref Range   WBC 10.2 4.0 - 10.5 K/uL   RBC 2.95 (L) 3.87 - 5.11 MIL/uL   Hemoglobin 7.8 (L) 12.0 - 15.0 g/dL   HCT 25.3 (L) 36.0 - 46.0 %   MCV 85.8 78.0 - 100.0 fL   MCH 26.4 26.0 - 34.0 pg   MCHC 30.8 30.0 - 36.0 g/dL   RDW 18.4 (H) 11.5 - 15.5 %   Platelets 376 150 - 400 K/uL  Glucose, capillary     Status: Abnormal   Collection  Time: 12/23/14  7:26 AM  Result Value Ref Range   Glucose-Capillary 287 (H) 65 - 99 mg/dL  Glucose, capillary     Status: Abnormal   Collection Time: 12/23/14 11:18 AM  Result Value Ref Range   Glucose-Capillary 158 (H) 65 - 99 mg/dL  Glucose, capillary     Status: Abnormal   Collection Time: 12/23/14  4:47 PM  Result Value Ref Range   Glucose-Capillary 334 (H) 65 - 99 mg/dL  Glucose, capillary     Status: Abnormal   Collection Time: 12/23/14  9:08 PM  Result Value Ref Range   Glucose-Capillary 498 (H) 65 - 99 mg/dL  Protime-INR     Status: Abnormal   Collection Time: 12/24/14  2:59 AM  Result Value Ref Range   Prothrombin Time 30.4 (H) 11.6 - 15.2 seconds   INR 2.98 (H) 0.00 - 1.49  Basic metabolic panel     Status: Abnormal   Collection Time: 12/24/14  2:59 AM  Result Value Ref Range   Sodium 134 (L) 135 - 145 mmol/L   Potassium 5.6 (H) 3.5 - 5.1 mmol/L   Chloride 99 (L) 101 - 111 mmol/L   CO2 29 22 - 32 mmol/L   Glucose, Bld 377 (H) 65 - 99 mg/dL   BUN 85 (H) 6 - 20 mg/dL   Creatinine, Ser 3.04 (H) 0.44 - 1.00 mg/dL   Calcium 9.3 8.9 - 10.3 mg/dL   GFR calc non Af Amer 14 (L) >60 mL/min   GFR calc Af Amer 16 (L) >60 mL/min    Comment: (NOTE) The eGFR has been calculated using the CKD EPI equation. This calculation has not been validated in all clinical situations. eGFR's persistently <60 mL/min signify possible Chronic Kidney Disease.    Anion gap 6 5 - 15  CBC     Status: Abnormal   Collection Time: 12/24/14  2:59 AM  Result Value Ref Range   WBC 7.8 4.0 - 10.5 K/uL   RBC 2.99 (L) 3.87 - 5.11 MIL/uL   Hemoglobin 7.7 (L) 12.0 - 15.0 g/dL   HCT 25.4 (L) 36.0 - 46.0 %   MCV 84.9 78.0 - 100.0 fL   MCH 25.8 (L) 26.0 - 34.0 pg   MCHC 30.3 30.0 - 36.0 g/dL   RDW 18.3 (H) 11.5 - 15.5 %   Platelets 390 150 - 400 K/uL  Glucose, capillary     Status: Abnormal   Collection Time: 12/24/14  7:34 AM  Result Value Ref Range   Glucose-Capillary 275 (H) 65 - 99 mg/dL    Comment 1 Notify RN   Glucose, capillary     Status: Abnormal   Collection Time: 12/24/14 11:51 AM  Result Value Ref Range   Glucose-Capillary 259 (H) 65 - 99 mg/dL   Comment 1 Notify RN   Basic metabolic panel     Status: Abnormal   Collection Time: 12/24/14  1:11 PM  Result Value Ref Range   Sodium 134 (L) 135 - 145 mmol/L   Potassium 5.3 (H) 3.5 - 5.1 mmol/L   Chloride 95 (L) 101 - 111 mmol/L   CO2 29 22 - 32 mmol/L   Glucose, Bld 325 (H) 65 - 99 mg/dL   BUN 81 (H) 6 - 20 mg/dL   Creatinine, Ser 3.00 (H) 0.44 - 1.00 mg/dL   Calcium 9.4 8.9 - 10.3 mg/dL   GFR calc non Af Amer 14 (L) >60 mL/min   GFR calc Af Amer 16 (L) >60 mL/min    Comment: (NOTE) The eGFR has been calculated using the CKD EPI equation. This calculation has not been validated in all clinical situations. eGFR's persistently <60 mL/min signify possible Chronic Kidney Disease.    Anion gap 10 5 - 15     Signed: Milagros Loll, MD 12/25/2014, 10:50 PM    Services Ordered on Discharge: [facility] RN Equipment Ordered on Discharge: Nebulizer machine

## 2014-12-26 ENCOUNTER — Telehealth: Payer: Self-pay | Admitting: Pulmonary Disease

## 2014-12-26 ENCOUNTER — Ambulatory Visit (INDEPENDENT_AMBULATORY_CARE_PROVIDER_SITE_OTHER): Payer: Medicare Other | Admitting: Pharmacist Clinician (PhC)/ Clinical Pharmacy Specialist

## 2014-12-26 ENCOUNTER — Telehealth: Payer: Self-pay | Admitting: Internal Medicine

## 2014-12-26 DIAGNOSIS — I481 Persistent atrial fibrillation: Secondary | ICD-10-CM | POA: Diagnosis not present

## 2014-12-26 DIAGNOSIS — I480 Paroxysmal atrial fibrillation: Secondary | ICD-10-CM | POA: Diagnosis not present

## 2014-12-26 DIAGNOSIS — I4819 Other persistent atrial fibrillation: Secondary | ICD-10-CM

## 2014-12-26 DIAGNOSIS — Z7901 Long term (current) use of anticoagulants: Secondary | ICD-10-CM

## 2014-12-26 LAB — CULTURE, BLOOD (ROUTINE X 2)
CULTURE: NO GROWTH
Culture: NO GROWTH

## 2014-12-26 LAB — POCT INR: INR: 2

## 2014-12-26 MED ORDER — WARFARIN SODIUM 2.5 MG PO TABS: 2.5000 mg | ORAL_TABLET | Freq: Every day | ORAL | Status: DC

## 2014-12-26 NOTE — Telephone Encounter (Signed)
It was pt's daughter Cristal Ford [phone]) I reviewed the jury summons she brought in for pt and unfortunately we can not help b/c date she is requested for is 12/29/14, and our letter would have to be mailed in 10 days ahead  I did see that they could request to not be asked to serve due to her being 81 y/o or older- they just have to take her birth certificate to the courthouse   I advised that we would be okay with filling out handicap placard b/c the pt does use portable o2  She is aware JN out of the office until next wk  Will call her once it is ready  Form given to Mindy to have JN sign  Thanks!

## 2014-12-26 NOTE — Telephone Encounter (Signed)
TCM Phone call.Marland Kitchen Appt on 01/04/15 at 12:10 w/ Tereso Newcomer at [facility] .Marland Kitchen Please call .Marland Kitchen Thanks

## 2014-12-26 NOTE — Telephone Encounter (Signed)
LMTCB

## 2014-12-27 ENCOUNTER — Encounter: Payer: Self-pay | Admitting: *Deleted

## 2014-12-27 NOTE — Telephone Encounter (Signed)
Attempted to call patient; unable to leave message at this current time.

## 2014-12-27 NOTE — Telephone Encounter (Signed)
Duplicate message. Michaelle Copas summons addressed in other message. 12/26/2014 Nothing further needed.

## 2014-12-28 NOTE — Telephone Encounter (Signed)
Form completed and Alexis [name] is aware it is ready for pick up. Nothing further needed

## 2014-12-28 NOTE — Telephone Encounter (Signed)
Dr. Jamison Neighbor is not in the office until next week and will give to him when he return.

## 2014-12-28 NOTE — Telephone Encounter (Signed)
Alexis [name] - has JN signed this yet.

## 2014-12-29 ENCOUNTER — Other Ambulatory Visit: Payer: Self-pay | Admitting: Pulmonary Disease

## 2015-01-02 ENCOUNTER — Ambulatory Visit: Payer: Medicare Other | Admitting: Pharmacist Clinician (PhC)/ Clinical Pharmacy Specialist

## 2015-01-02 ENCOUNTER — Ambulatory Visit (INDEPENDENT_AMBULATORY_CARE_PROVIDER_SITE_OTHER): Payer: Medicare Other | Admitting: Pharmacist Clinician (PhC)/ Clinical Pharmacy Specialist

## 2015-01-02 DIAGNOSIS — I480 Paroxysmal atrial fibrillation: Secondary | ICD-10-CM

## 2015-01-02 DIAGNOSIS — Z7901 Long term (current) use of anticoagulants: Secondary | ICD-10-CM

## 2015-01-02 LAB — POCT INR: INR: 1.7

## 2015-01-03 NOTE — Progress Notes (Signed)
Cardiology Office Note   Date:  01/03/2015   ID:  Alexis [name], DOB 1932/04/22, MRN [id number]  PCP:  Elizabeth Palau, FNP  Cardiologist:  Dr. K. [place] Hilty   Electrophysiologist:  n/a  Chief Complaint  Patient presents with  . Congestive Heart Failure  . Atrial Fibrillation  . Hospitalization Follow-up     History of Present Illness: Alexis [name] is a 81 y.o. female with a hx of    Studies/Reports Reviewed Today:  Echo 12/01/14 - Left ventricle: The cavity size was normal. Systolic function was normal. The estimated ejection fraction was in the range of 55% to 60%. Wall motion was normal; there were no regional wall motion abnormalities. - Aortic valve: Valve area (VTI): 1.78 cm^2. Valve area (Vmax): 1.82 cm^2. Valve area (Vmean): 1.64 cm^2. - Mitral valve: There was mild regurgitation. Valve area by continuity equation (using LVOT flow): 1.79 cm^2. - Left atrium: The atrium was mildly dilated.  Myoview 07/21/13 Low risk stress nuclear study with mild anterior wall breast attenuation artifact.   Past Medical History  Diagnosis Date  . Hypertension   . Neuropathy   . Anxiety   . PONV (postoperative nausea and vomiting)   . CHF (congestive heart failure)   . COPD (chronic obstructive pulmonary disease)     on home, nocturnal oxygen.   . Family history of adverse reaction to anesthesia   . Cardiomyopathy   . Diabetes mellitus without complication     TYPE 2  . Macular degeneration, bilateral   . Varicose veins   . GIB (gastrointestinal bleeding)   . Paroxysmal atrial fibrillation   . Shortness of breath dyspnea   . Acute on chronic diastolic heart failure 12/22/2014    Past Surgical History  Procedure Laterality Date  . Hip fracture surgery  2004  . Abdominal hysterectomy    . Vein surgery    . Esophagogastroduodenoscopy N/A 12/01/2014    Procedure: ESOPHAGOGASTRODUODENOSCOPY (EGD);  Surgeon: Hilarie Fredrickson, MD;  Location: [facility]  ENDOSCOPY;  Service: Endoscopy;  Laterality: N/A;     Current Outpatient Prescriptions  Medication Sig Dispense Refill  . acetaminophen (TYLENOL) 325 MG tablet Take 650 mg by mouth every 6 (six) hours as needed for mild pain.     Marland Kitchen albuterol (PROVENTIL HFA;VENTOLIN HFA) 108 (90 BASE) MCG/ACT inhaler Inhale 2 puffs into the lungs every 6 (six) hours as needed for wheezing or shortness of breath. 1 Inhaler 3  . albuterol (PROVENTIL) (2.5 MG/3ML) 0.083% nebulizer solution Take 3 mLs (2.5 mg total) by nebulization every 6 (six) hours as needed for wheezing or shortness of breath. 75 mL 0  . atorvastatin (LIPITOR) 20 MG tablet Take 20 mg by mouth at bedtime.    . cholecalciferol (VITAMIN D) 1000 UNITS tablet Take 1,000 Units by mouth at bedtime.     Marland Kitchen escitalopram (LEXAPRO) 10 MG tablet Take 10 mg by mouth daily.    . fenofibrate 160 MG tablet Take 160 mg by mouth daily.    Marland Kitchen gabapentin (NEURONTIN) 100 MG capsule Take 100-200 mg by mouth 2 (two) times daily. Take 1 capsule (100 mg) morning, take 2 capsules (200 mg) at bedtime    . glipiZIDE (GLUCOTROL XL) 10 MG 24 hr tablet Take 10 mg by mouth daily.  11  . hydrALAZINE (APRESOLINE) 25 MG tablet Take 25 mg by mouth 2 (two) times daily.  3  . iron polysaccharides (NIFEREX) 150 MG capsule Take 1 capsule (150 mg total) by mouth 2 (two)  times daily. 60 capsule 0  . isosorbide mononitrate (IMDUR) 30 MG 24 hr tablet Take 30 mg by mouth daily.  3  . methimazole (TAPAZOLE) 5 MG tablet Take 1 tablet (5 mg total) by mouth daily. (Patient taking differently: Take 5 mg by mouth 2 (two) times daily. ) 30 tablet 2  . metoprolol tartrate (LOPRESSOR) 25 MG tablet Take 1 tablet (25 mg total) by mouth 2 (two) times daily. 60 tablet 0  . Multiple Vitamin (MULTIVITAMIN WITH MINERALS) TABS tablet Take 1 tablet by mouth daily.    . OXYGEN Inhale into the lungs.    . pantoprazole (PROTONIX) 40 MG tablet Take 1 tablet (40 mg total) by mouth daily. 30 tablet 0  . potassium  chloride SA (K-DUR,KLOR-CON) 20 MEQ tablet Take 1 tablet (20 mEq total) by mouth daily. 60 tablet 2  . Pramoxine-Menthol (GOLD BOND MAXIMUM RELIEF) 1-1 % CREA Apply 1 application topically 2 (two) times daily as needed (for would care).    . predniSONE (DELTASONE) 20 MG tablet Take 2 tablets (40 mg total) by mouth daily with breakfast. 2 tablet 0  . torsemide (DEMADEX) 20 MG tablet Take 2 tablets (40 mg total) by mouth 2 (two) times daily. 120 tablet 2  . warfarin (COUMADIN) 2.5 MG tablet Take 1 tablet (2.5 mg total) by mouth daily. 30 tablet 3   No current facility-administered medications for this visit.    Allergies:   Other; Codeine; Penicillins; Procaine; and Yellow dyes (non-tartrazine)    Social History:  The patient  reports that she has been passively smoking.  She has never used smokeless tobacco. She reports that she does not drink alcohol or use illicit drugs.   Family History:  The patient's family history includes Breast cancer in her mother and sister; Cancer in her brother; Colon cancer (age of onset: 71) in her father; Lung cancer in her brother.    ROS:   Please see the history of present illness.   ROS    PHYSICAL EXAM: VS:  There were no vitals taken for this visit.    Wt Readings from Last 3 Encounters:  12/24/14 149 lb 8 oz (67.813 kg)  12/19/14 154 lb 6.4 oz (70.035 kg)  12/17/14 152 lb 3.2 oz (69.037 kg)     GEN: Well nourished, well developed, in no acute distress HEENT: normal Neck: no JVD, no carotid bruits, no masses Cardiac:  Normal S1/S2, RRR; no murmur ,  no rubs or gallops, no edema  Respiratory:  clear to auscultation bilaterally, no wheezing, rhonchi or rales. GI: soft, nontender, nondistended, + BS MS: no deformity or atrophy Skin: warm and dry  Neuro:  CNs II-XII intact, Strength and sensation are intact Psych: Normal affect   EKG:  EKG is ordered today.  It demonstrates:      Recent Labs: 05/12/2014: Pro B Natriuretic peptide (BNP)  2482.0* 12/14/2014: ALT 12* 12/15/2014: TSH 0.261* 12/21/2014: B Natriuretic Peptide 557.5* 12/23/2014: Magnesium 2.4 12/24/2014: BUN 81*; Creatinine, Ser 3.00*; Hemoglobin 7.7*; Platelets 390; Potassium 5.3*; Sodium 134*    Lipid Panel    Component Value Date/Time   CHOL 126 12/14/2014 0325   TRIG 71 12/14/2014 0325   HDL 40* 12/14/2014 0325   CHOLHDL 3.2 12/14/2014 0325   VLDL 14 12/14/2014 0325   LDLCALC 72 12/14/2014 0325      ASSESSMENT AND PLAN:  No diagnosis found.     Medication Changes: Current medicines are reviewed at length with the patient today.  Concerns regarding  medicines are as outlined above.  The following changes have been made:   Discontinued Medications   No medications on file   Modified Medications   No medications on file   New Prescriptions   No medications on file     Labs/ tests ordered today include:   No orders of the defined types were placed in this encounter.     Disposition:   FU with    Signed, Tereso Newcomer, PA-C, MHS 01/03/2015 10:07 PM    [facility] [address] [place], [place], [place]  [address] Phone: [phone]; Fax: [phone]    This encounter was created in error - please disregard.

## 2015-01-04 ENCOUNTER — Encounter: Payer: Medicare Other | Admitting: Physician Assistant

## 2015-01-04 ENCOUNTER — Telehealth: Payer: Self-pay | Admitting: *Deleted

## 2015-01-04 NOTE — Telephone Encounter (Signed)
Faxed order r/t PT/INR to [facility] 

## 2015-01-06 ENCOUNTER — Telehealth: Payer: Self-pay | Admitting: Internal Medicine

## 2015-01-06 ENCOUNTER — Encounter: Payer: Self-pay | Admitting: Physician Assistant

## 2015-01-06 NOTE — Telephone Encounter (Signed)
Would advise to increase demedex to 20 mg q8hrs for 3 days, then go back to twice daily if weight goes back down closer to her prior weight of 147.  Thanks.  Dr. Rexene Edison

## 2015-01-06 NOTE — Telephone Encounter (Signed)
Returned call to Kathrynn Humble RN Dr.Hilty advised to increase demadex to 20 mg 2 tablets every 8 hrs for 3 days only then return to normal dose 20 mg 2 tablets twice a day if weight goes back down closer to her prior weight 147.Stated she is going back to see pt on Mon 8/15 and will call back if needed.

## 2015-01-06 NOTE — Telephone Encounter (Signed)
Received a call from Kathrynn Humble RN with [facility].She was calling to report to Dr.Hilty patient has increased swelling in ankles today, 1+pitting edema.Weight 152 lbs.147 lbs on 8/4.Stated she has been eating more.Patient feels fine.Pt takes demadex 20 mg 2 tablets twice a day.Message sent to Dr.Hilty for advice.

## 2015-01-09 ENCOUNTER — Ambulatory Visit (INDEPENDENT_AMBULATORY_CARE_PROVIDER_SITE_OTHER): Payer: Medicare Other | Admitting: Pharmacist Clinician (PhC)/ Clinical Pharmacy Specialist

## 2015-01-09 DIAGNOSIS — I480 Paroxysmal atrial fibrillation: Secondary | ICD-10-CM

## 2015-01-09 DIAGNOSIS — Z7901 Long term (current) use of anticoagulants: Secondary | ICD-10-CM

## 2015-01-09 LAB — POCT INR: INR: 1.7

## 2015-01-16 ENCOUNTER — Ambulatory Visit (INDEPENDENT_AMBULATORY_CARE_PROVIDER_SITE_OTHER): Payer: Medicare Other | Admitting: Pharmacist Clinician (PhC)/ Clinical Pharmacy Specialist

## 2015-01-16 DIAGNOSIS — I480 Paroxysmal atrial fibrillation: Secondary | ICD-10-CM

## 2015-01-16 DIAGNOSIS — Z7901 Long term (current) use of anticoagulants: Secondary | ICD-10-CM

## 2015-01-16 LAB — POCT INR: INR: 3.8

## 2015-01-17 ENCOUNTER — Telehealth: Payer: Self-pay

## 2015-01-17 NOTE — Telephone Encounter (Signed)
Orders for INR faxed to [facility]. 

## 2015-01-19 ENCOUNTER — Telehealth: Payer: Self-pay | Admitting: *Deleted

## 2015-01-19 NOTE — Telephone Encounter (Signed)
Faxed signed orders r/t PT/INR to [facility] 

## 2015-01-20 ENCOUNTER — Telehealth: Payer: Self-pay | Admitting: *Deleted

## 2015-01-20 ENCOUNTER — Telehealth: Payer: Self-pay | Admitting: Pulmonary Disease

## 2015-01-20 DIAGNOSIS — J9621 Acute and chronic respiratory failure with hypoxia: Secondary | ICD-10-CM

## 2015-01-20 NOTE — Telephone Encounter (Signed)
OVERNIGHT PULSE OX 01/05/15:  Performed on room air. 6 hours & 18 minutes of analyzed data. 52 desaturations w/ lowest saturation of 78%. 53 minutes spent with Saturation less than or equal to 88%.

## 2015-01-20 NOTE — Telephone Encounter (Signed)
-----   Message from Roslynn Amble, MD sent at 01/20/2015 11:04 AM EDT ----- Regarding: Overnight Pulse Ox Please call the patient and inform her that her overnight pulse ox on room air showed she does need oxygen while she's sleeping. Check with her to see how much she's been using while sleeping & then we need to re-order an overnight pulse ox on that level of oxygen. Thanks.

## 2015-01-20 NOTE — Telephone Encounter (Signed)
Called pt and is aware of results. She reports she normally uses 2 liters o2 while sleeping.  New ONO order placed to be done on 2 liters. Nothing further needed

## 2015-01-21 ENCOUNTER — Other Ambulatory Visit: Payer: Self-pay | Admitting: Pulmonary Disease

## 2015-01-23 ENCOUNTER — Ambulatory Visit (INDEPENDENT_AMBULATORY_CARE_PROVIDER_SITE_OTHER): Payer: Medicare Other | Admitting: Endocrinology

## 2015-01-23 ENCOUNTER — Encounter: Payer: Self-pay | Admitting: Endocrinology

## 2015-01-23 VITALS — BP 140/62 | HR 62 | Temp 97.9°F | Ht 62.0 in | Wt 145.0 lb

## 2015-01-23 DIAGNOSIS — E0590 Thyrotoxicosis, unspecified without thyrotoxic crisis or storm: Secondary | ICD-10-CM

## 2015-01-23 LAB — TSH: TSH: 0.68 u[IU]/mL (ref 0.35–4.50)

## 2015-01-23 LAB — T4, FREE: FREE T4: 1.28 ng/dL (ref 0.60–1.60)

## 2015-01-23 LAB — POCT INR: INR: 2.9

## 2015-01-23 MED ORDER — METHIMAZOLE 5 MG PO TABS: 5.0000 mg | ORAL_TABLET | ORAL | Status: DC

## 2015-01-23 NOTE — Progress Notes (Signed)
Subjective:    Patient ID: Alexis [name], female    DOB: 1931/09/13, 81 y.o.   MRN: [id number]  HPI 1 month ago, pt was dx'ed with hyperthyroidism, when she was in the hospital for CHF.  she has never been known to have thyroid problems before.  she has never had XRT to the anterior neck, or thyroid surgery.  she has never had thyroid imaging.  she does not consume kelp or any other non-prescribed thyroid medication.  she has never been on amiodarone.   She was rx'ed tapazole.  She has slight hoarseness, but assoc slight weight loss.   Past Medical History  Diagnosis Date  . Hypertension   . Neuropathy   . Anxiety   . PONV (postoperative nausea and vomiting)   . CHF (congestive heart failure)   . COPD (chronic obstructive pulmonary disease)     on home, nocturnal oxygen.   . Family history of adverse reaction to anesthesia   . Cardiomyopathy   . Diabetes mellitus without complication     TYPE 2  . Macular degeneration, bilateral   . Varicose veins   . GIB (gastrointestinal bleeding)   . Paroxysmal atrial fibrillation   . Shortness of breath dyspnea   . Acute on chronic diastolic heart failure 12/22/2014    Past Surgical History  Procedure Laterality Date  . Hip fracture surgery  2004  . Abdominal hysterectomy    . Vein surgery    . Esophagogastroduodenoscopy N/A 12/01/2014    Procedure: ESOPHAGOGASTRODUODENOSCOPY (EGD);  Surgeon: Hilarie Fredrickson, MD;  Location[facility] ENDOSCOPY;  Service: Endoscopy;  Laterality: N/A;    Social History   Social History  . Marital Status: Widowed    Spouse Name: N/A  . Number of Children: Y  . Years of Education: N/A   Occupational History  . retired    Social History Main Topics  . Smoking status: Passive Smoke Exposure - Never Smoker  . Smokeless tobacco: Never Used  . Alcohol Use: No  . Drug Use: No  . Sexual Activity: Not on file   Other Topics Concern  . Not on file   Social History Narrative   Originally from [place]. She has  always lived in [place] & had no travel. She has significant smoke exposure through her husband for 60 years. She has no pets currently. No prior bird or mold exposure. 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     Current Outpatient Prescriptions on File Prior to Visit  Medication Sig Dispense Refill  . acetaminophen (TYLENOL) 325 MG tablet Take 650 mg by mouth every 6 (six) hours as needed for mild pain.     Marland Kitchen albuterol (PROVENTIL HFA;VENTOLIN HFA) 108 (90 BASE) MCG/ACT inhaler Inhale 2 puffs into the lungs every 6 (six) hours as needed for wheezing or shortness of breath. 1 Inhaler 3  . albuterol (PROVENTIL) (2.5 MG/3ML) 0.083% nebulizer solution Take 3 mLs (2.5 mg total) by nebulization every 6 (six) hours as needed for wheezing or shortness of breath. 75 mL 0  . atorvastatin (LIPITOR) 20 MG tablet Take 20 mg by mouth at bedtime.    . cholecalciferol (VITAMIN D) 1000 UNITS tablet Take 1,000 Units by mouth at bedtime.     Marland Kitchen escitalopram (LEXAPRO) 10 MG tablet Take 10 mg by mouth daily.    . fenofibrate 160 MG  tablet Take 160 mg by mouth daily.    Marland Kitchen gabapentin (NEURONTIN) 100 MG capsule Take 100-200 mg by mouth 2 (two) times daily. Take 1 capsule (100 mg) morning, take 2 capsules (200 mg) at bedtime    . glipiZIDE (GLUCOTROL XL) 10 MG 24 hr tablet Take 10 mg by mouth daily.  11  . hydrALAZINE (APRESOLINE) 25 MG tablet Take 25 mg by mouth 2 (two) times daily.  3  . iron polysaccharides (NIFEREX) 150 MG capsule Take 1 capsule (150 mg total) by mouth 2 (two) times daily. 60 capsule 0  . isosorbide mononitrate (IMDUR) 30 MG 24 hr tablet Take 30 mg by mouth daily.  3  . metoprolol tartrate (LOPRESSOR) 25 MG tablet Take 1 tablet (25 mg total) by mouth 2 (two) times daily. 60 tablet 0  . Multiple Vitamin (MULTIVITAMIN WITH MINERALS) TABS tablet Take 1 tablet by  mouth daily.    . OXYGEN Inhale into the lungs.    . pantoprazole (PROTONIX) 40 MG tablet Take 1 tablet (40 mg total) by mouth daily. 30 tablet 0  . potassium chloride SA (K-DUR,KLOR-CON) 20 MEQ tablet Take 1 tablet (20 mEq total) by mouth daily. 60 tablet 2  . Pramoxine-Menthol (GOLD BOND MAXIMUM RELIEF) 1-1 % CREA Apply 1 application topically 2 (two) times daily as needed (for would care).    . predniSONE (DELTASONE) 20 MG tablet Take 2 tablets (40 mg total) by mouth daily with breakfast. 2 tablet 0  . torsemide (DEMADEX) 20 MG tablet Take 2 tablets (40 mg total) by mouth 2 (two) times daily. 120 tablet 2  . warfarin (COUMADIN) 2.5 MG tablet Take 1 tablet (2.5 mg total) by mouth daily. 30 tablet 3   No current facility-administered medications on file prior to visit.    Allergies  Allergen Reactions  . Other     novocaine broke mouth out  . Codeine Hives  . Penicillins Hives  . Procaine Hives  . Yellow Dyes (Non-Tartrazine) Nausea And Vomiting    "deathly allergic" No other information given to explain reaction.    Family History  Problem Relation Age of Onset  . Colon cancer Father 41    advanced when discovered, no surgery or therapy.  this led to his death  . Breast cancer Sister   . Cancer Brother   . Breast cancer Mother   . Lung cancer Brother   . Thyroid disease Neg Hx     BP 140/62 mmHg  Pulse 62  Temp(Src) 97.9 F (36.6 C) (Oral)  Ht 5\' 2"  (1.575 m)  Wt 145 lb (65.772 kg)  BMI 26.51 kg/m2  SpO2 92%   Review of Systems denies fever, headache, neck pain, double vision, palpitations, sob, diarrhea, polyuria, muscle weakness, excessive diaphoresis, tremor, anxiety, heat intolerance, and rhinorrhea.  She attributes easy bruising to coumadin.    Objective:   Physical Exam VS: see vs page GEN: no distress HEAD: head: no deformity eyes: no periorbital swelling, no proptosis external nose and ears are normal mouth: no lesion seen NECK: supple, thyroid is not  enlarged CHEST WALL: no deformity LUNGS:  Clear to auscultation CV: reg rate and rhythm, no murmur ABD: abdomen is soft, nontender.  no hepatosplenomegaly.  not distended.  no hernia MUSCULOSKELETAL: muscle bulk and strength are grossly normal.  no obvious joint swelling.  gait is normal and steady EXTEMITIES: no deformity.  no edema PULSES: no carotid bruit.   NEURO:  cn 2-12 grossly intact.   readily moves all  4's.  sensation is intact to touch on all 4's. SKIN:  Normal texture and temperature.  No rash or suspicious lesion is visible.   NODES:  None palpable at the neck PSYCH: alert, well-oriented.  Does not appear anxious nor depressed.    Lab Results  Component Value Date   CREATININE 3.00* 12/24/2014   BUN 81* 12/24/2014   NA 134* 12/24/2014   K 5.3* 12/24/2014   CL 95* 12/24/2014   CO2 29 12/24/2014   Lab Results  Component Value Date   HGBA1C 7.7* 11/30/2014    I have reviewed outside records: in the hospital, pt was noted to have suppressed TSH, and started on tapazole.  Radiol: CXR (12/21/14): no mention is made of a goiter  Lab Results  Component Value Date   TSH 0.68 01/23/2015   T3TOTAL 80 12/15/2014      Assessment & Plan:  Hyperthyroidism, new, uncertain etiology.  Improved on tapazole.  i have reduced tapazole to TIW  Patient is advised the following: Patient Instructions  blood tests are requested for you today.  We'll let you know about the results. if ever you have fever while taking methimazole, stop it and call us, because of the risk of a rare side-effect Please come back for a follow-up appointment in 1 month.       Hyperthyroidism The thyroid is a large gland located in the lower front part of your neck. The thyroid helps control metabolism. Metabolism is how your body uses food. It controls metabolism with the hormone thyroxine. When the thyroid is overactive, it produces too much hormone. When this happens, these following problems may  occur:   Nervousness  Heat intolerance  Weight loss (in spite of increase food intake)  Diarrhea  Change in hair or skin texture  Palpitations (heart skipping or having extra beats)  Tachycardia (rapid heart rate)  Loss of menstruation (amenorrhea)  Shaking of the hands CAUSES  Grave's Disease (the immune system attacks the thyroid gland). This is the most common cause.  Inflammation of the thyroid gland.  Tumor (usually benign) in the thyroid gland or elsewhere.  Excessive use of thyroid medications (both prescription and 'natural').  Excessive ingestion of Iodine. DIAGNOSIS  To prove hyperthyroidism, your caregiver may do blood tests and ultrasound tests. Sometimes the signs are hidden. It may be necessary for your caregiver to watch this illness with blood tests, either before or after diagnosis and treatment. TREATMENT Short-term treatment There are several treatments to control symptoms. Drugs called beta blockers may give some relief. Drugs that decrease hormone production will provide temporary relief in many people. These measures will usually not give permanent relief. Definitive therapy There are treatments available which can be discussed between you and your caregiver which will permanently treat the problem. These treatments range from surgery (removal of the thyroid), to the use of radioactive iodine (destroys the thyroid by radiation), to the use of antithyroid drugs (interfere with hormone synthesis). The first two treatments are permanent and usually successful. They most often require hormone replacement therapy for life. This is because it is impossible to remove or destroy the exact amount of thyroid required to make a person euthyroid (normal). HOME CARE INSTRUCTIONS  See your caregiver if the problems you are being treated for get worse. Examples of this would be the problems listed above. SEEK MEDICAL CARE IF: Your general condition worsens. MAKE SURE  YOU:   Understand these instructions.  Will watch your condition.  Will get help  right away if you are not doing well or get worse. Document Released: 05/13/2005 Document Revised: 08/05/2011 Document Reviewed: 09/24/2006 [facility] Patient Information 2015 [place], [place]. This information is not intended to replace advice given to you by your health care provider. Make sure you discuss any questions you have with your health care provider.

## 2015-01-23 NOTE — Patient Instructions (Addendum)
blood tests are requested for you today.  We'll let you know about the results. if ever you have fever while taking methimazole, stop it and call us, because of the risk of a rare side-effect Please come back for a follow-up appointment in 1 month.       Hyperthyroidism The thyroid is a large gland located in the lower front part of your neck. The thyroid helps control metabolism. Metabolism is how your body uses food. It controls metabolism with the hormone thyroxine. When the thyroid is overactive, it produces too much hormone. When this happens, these following problems may occur:   Nervousness  Heat intolerance  Weight loss (in spite of increase food intake)  Diarrhea  Change in hair or skin texture  Palpitations (heart skipping or having extra beats)  Tachycardia (rapid heart rate)  Loss of menstruation (amenorrhea)  Shaking of the hands CAUSES  Grave's Disease (the immune system attacks the thyroid gland). This is the most common cause.  Inflammation of the thyroid gland.  Tumor (usually benign) in the thyroid gland or elsewhere.  Excessive use of thyroid medications (both prescription and 'natural').  Excessive ingestion of Iodine. DIAGNOSIS  To prove hyperthyroidism, your caregiver may do blood tests and ultrasound tests. Sometimes the signs are hidden. It may be necessary for your caregiver to watch this illness with blood tests, either before or after diagnosis and treatment. TREATMENT Short-term treatment There are several treatments to control symptoms. Drugs called beta blockers may give some relief. Drugs that decrease hormone production will provide temporary relief in many people. These measures will usually not give permanent relief. Definitive therapy There are treatments available which can be discussed between you and your caregiver which will permanently treat the problem. These treatments range from surgery (removal of the thyroid), to the use of  radioactive iodine (destroys the thyroid by radiation), to the use of antithyroid drugs (interfere with hormone synthesis). The first two treatments are permanent and usually successful. They most often require hormone replacement therapy for life. This is because it is impossible to remove or destroy the exact amount of thyroid required to make a person euthyroid (normal). HOME CARE INSTRUCTIONS  See your caregiver if the problems you are being treated for get worse. Examples of this would be the problems listed above. SEEK MEDICAL CARE IF: Your general condition worsens. MAKE SURE YOU:   Understand these instructions.  Will watch your condition.  Will get help right away if you are not doing well or get worse. Document Released: 05/13/2005 Document Revised: 08/05/2011 Document Reviewed: 09/24/2006 [facility] Patient Information 2015 [place], [place]. This information is not intended to replace advice given to you by your health care provider. Make sure you discuss any questions you have with your health care provider.

## 2015-01-24 ENCOUNTER — Ambulatory Visit (INDEPENDENT_AMBULATORY_CARE_PROVIDER_SITE_OTHER): Payer: Medicare Other | Admitting: Pharmacist Clinician (PhC)/ Clinical Pharmacy Specialist

## 2015-01-24 DIAGNOSIS — I480 Paroxysmal atrial fibrillation: Secondary | ICD-10-CM

## 2015-01-24 DIAGNOSIS — Z7901 Long term (current) use of anticoagulants: Secondary | ICD-10-CM

## 2015-01-25 ENCOUNTER — Telehealth: Payer: Self-pay | Admitting: Pharmacist Clinician (PhC)/ Clinical Pharmacy Specialist

## 2015-01-25 NOTE — Telephone Encounter (Signed)
Dt called to report patient noticed blood in urine this am.  Pt went to PCP for urinalysis this afternoon.  Dtr reports that blood was noted only with first void of the morning.  Returned call, advised that they did the right thing.  Dtr states that patient missed warfarin and evening meds for 2 days this week.  Suggested they continue with warfarin pending results from PCP.  If pt needs antibiotic they are to call with name of med. Otherwise continue warfarin.

## 2015-01-27 ENCOUNTER — Ambulatory Visit: Payer: Medicare Other

## 2015-01-27 ENCOUNTER — Ambulatory Visit: Payer: Medicare Other | Admitting: Pulmonary Disease

## 2015-01-30 ENCOUNTER — Other Ambulatory Visit: Payer: Self-pay | Admitting: Internal Medicine

## 2015-01-31 ENCOUNTER — Ambulatory Visit (INDEPENDENT_AMBULATORY_CARE_PROVIDER_SITE_OTHER): Payer: Medicare Other | Admitting: Pharmacist Clinician (PhC)/ Clinical Pharmacy Specialist

## 2015-01-31 DIAGNOSIS — Z7901 Long term (current) use of anticoagulants: Secondary | ICD-10-CM

## 2015-01-31 DIAGNOSIS — I480 Paroxysmal atrial fibrillation: Secondary | ICD-10-CM

## 2015-01-31 LAB — POCT INR: INR: 2.8

## 2015-02-02 ENCOUNTER — Telehealth: Payer: Self-pay | Admitting: *Deleted

## 2015-02-02 NOTE — Telephone Encounter (Signed)
Faxed signed order r/t PT/INR 

## 2015-02-06 ENCOUNTER — Ambulatory Visit (INDEPENDENT_AMBULATORY_CARE_PROVIDER_SITE_OTHER): Payer: Medicare Other | Admitting: Pharmacist Clinician (PhC)/ Clinical Pharmacy Specialist

## 2015-02-06 DIAGNOSIS — I480 Paroxysmal atrial fibrillation: Secondary | ICD-10-CM

## 2015-02-06 DIAGNOSIS — Z7901 Long term (current) use of anticoagulants: Secondary | ICD-10-CM

## 2015-02-06 LAB — PROTIME-INR: INR: 2.9 — AB (ref <=1.1)

## 2015-02-14 ENCOUNTER — Telehealth: Payer: Self-pay | Admitting: *Deleted

## 2015-02-14 ENCOUNTER — Telehealth: Payer: Self-pay | Admitting: Internal Medicine

## 2015-02-14 LAB — POCT INR: INR: 1.9

## 2015-02-14 NOTE — Telephone Encounter (Signed)
Faxed orders r/t [facility] to [facility] 

## 2015-02-14 NOTE — Telephone Encounter (Signed)
Diane is calling to speak with a nurse about the pt's medications. Please assist her  Thanks

## 2015-02-14 NOTE — Telephone Encounter (Signed)
Medications clarified w/ HHRN.

## 2015-02-15 ENCOUNTER — Telehealth: Payer: Self-pay | Admitting: Pharmacist Clinician (PhC)/ Clinical Pharmacy Specialist

## 2015-02-15 ENCOUNTER — Ambulatory Visit (INDEPENDENT_AMBULATORY_CARE_PROVIDER_SITE_OTHER): Payer: Medicare Other | Admitting: Pharmacist Clinician (PhC)/ Clinical Pharmacy Specialist

## 2015-02-15 DIAGNOSIS — Z7901 Long term (current) use of anticoagulants: Secondary | ICD-10-CM

## 2015-02-15 DIAGNOSIS — I480 Paroxysmal atrial fibrillation: Secondary | ICD-10-CM

## 2015-02-15 NOTE — Telephone Encounter (Signed)
Home Health nurse is returning your call.

## 2015-02-20 ENCOUNTER — Other Ambulatory Visit: Payer: Self-pay | Admitting: Pulmonary Disease

## 2015-02-21 LAB — POCT INR: INR: 2.1

## 2015-02-22 ENCOUNTER — Telehealth: Payer: Self-pay | Admitting: *Deleted

## 2015-02-22 ENCOUNTER — Ambulatory Visit (INDEPENDENT_AMBULATORY_CARE_PROVIDER_SITE_OTHER): Payer: Medicare Other | Admitting: Pharmacist Clinician (PhC)/ Clinical Pharmacy Specialist

## 2015-02-22 DIAGNOSIS — Z7901 Long term (current) use of anticoagulants: Secondary | ICD-10-CM

## 2015-02-22 DIAGNOSIS — I480 Paroxysmal atrial fibrillation: Secondary | ICD-10-CM

## 2015-02-22 NOTE — Telephone Encounter (Signed)
Faxed signed order related to PT/INR to [facility]

## 2015-02-23 ENCOUNTER — Ambulatory Visit: Payer: Medicare Other | Admitting: Endocrinology

## 2015-02-26 ENCOUNTER — Other Ambulatory Visit: Payer: Self-pay | Admitting: Internal Medicine

## 2015-02-27 ENCOUNTER — Ambulatory Visit (INDEPENDENT_AMBULATORY_CARE_PROVIDER_SITE_OTHER): Payer: Medicare Other | Admitting: Endocrinology

## 2015-02-27 VITALS — Ht 62.0 in

## 2015-02-27 DIAGNOSIS — E0840 Diabetes mellitus due to underlying condition with diabetic neuropathy, unspecified: Secondary | ICD-10-CM

## 2015-02-27 NOTE — Telephone Encounter (Signed)
Rx(s) sent to pharmacy electronically.  

## 2015-02-28 ENCOUNTER — Ambulatory Visit (INDEPENDENT_AMBULATORY_CARE_PROVIDER_SITE_OTHER): Payer: Medicare Other | Admitting: Pharmacist Clinician (PhC)/ Clinical Pharmacy Specialist

## 2015-02-28 DIAGNOSIS — Z7901 Long term (current) use of anticoagulants: Secondary | ICD-10-CM

## 2015-02-28 DIAGNOSIS — I480 Paroxysmal atrial fibrillation: Secondary | ICD-10-CM

## 2015-02-28 LAB — POCT INR: INR: 2.1

## 2015-03-02 ENCOUNTER — Telehealth: Payer: Self-pay | Admitting: *Deleted

## 2015-03-02 NOTE — Telephone Encounter (Signed)
Faxed order r/t PT/INR to [facility] 

## 2015-03-05 NOTE — Progress Notes (Signed)
   Subjective:    Patient ID: Alexis [name], female    DOB: 1931-06-11, 81 y.o.   MRN: [id number]  HPI -   Review of Systems     Objective:   Physical Exam        Assessment & Plan:

## 2015-03-14 ENCOUNTER — Ambulatory Visit (INDEPENDENT_AMBULATORY_CARE_PROVIDER_SITE_OTHER): Payer: Medicare Other | Admitting: Pharmacist Clinician (PhC)/ Clinical Pharmacy Specialist

## 2015-03-14 DIAGNOSIS — Z7901 Long term (current) use of anticoagulants: Secondary | ICD-10-CM

## 2015-03-14 DIAGNOSIS — I480 Paroxysmal atrial fibrillation: Secondary | ICD-10-CM

## 2015-03-14 LAB — POCT INR: INR: 1.7

## 2015-03-31 ENCOUNTER — Ambulatory Visit (INDEPENDENT_AMBULATORY_CARE_PROVIDER_SITE_OTHER): Payer: Medicare Other | Admitting: Pharmacist Clinician (PhC)/ Clinical Pharmacy Specialist

## 2015-03-31 DIAGNOSIS — Z7901 Long term (current) use of anticoagulants: Secondary | ICD-10-CM

## 2015-03-31 DIAGNOSIS — I4819 Other persistent atrial fibrillation: Secondary | ICD-10-CM

## 2015-03-31 DIAGNOSIS — I481 Persistent atrial fibrillation: Secondary | ICD-10-CM

## 2015-03-31 DIAGNOSIS — I480 Paroxysmal atrial fibrillation: Secondary | ICD-10-CM | POA: Diagnosis not present

## 2015-03-31 LAB — POCT INR: INR: 1.5

## 2015-04-08 ENCOUNTER — Other Ambulatory Visit: Payer: Self-pay | Admitting: Pulmonary Disease

## 2015-04-10 ENCOUNTER — Other Ambulatory Visit: Payer: Self-pay | Admitting: Pulmonary Disease

## 2015-04-12 ENCOUNTER — Other Ambulatory Visit: Payer: Self-pay | Admitting: Internal Medicine

## 2015-04-12 ENCOUNTER — Other Ambulatory Visit: Payer: Self-pay | Admitting: Pulmonary Disease

## 2015-04-12 MED ORDER — TORSEMIDE 20 MG PO TABS: 40.0000 mg | ORAL_TABLET | Freq: Two times a day (BID) | ORAL | Status: DC

## 2015-04-12 NOTE — Telephone Encounter (Signed)
°*  STAT* If patient is at the pharmacy, call can be transferred to refill team.   1. Which medications need to be refilled? (please list name of each medication and dose if known) Torsemide 20mg   2. Which pharmacy/location (including street and city if local pharmacy) is medication to be sent to?[facility] on [address]  3. Do they need a 30 day or 90 day supply? 30

## 2015-04-12 NOTE — Telephone Encounter (Signed)
Rx(s) sent to pharmacy electronically.  

## 2015-04-17 ENCOUNTER — Ambulatory Visit: Payer: Medicare Other | Admitting: Pharmacist Clinician (PhC)/ Clinical Pharmacy Specialist

## 2015-04-18 ENCOUNTER — Ambulatory Visit (INDEPENDENT_AMBULATORY_CARE_PROVIDER_SITE_OTHER): Payer: Medicare Other | Admitting: Pharmacist Clinician (PhC)/ Clinical Pharmacy Specialist

## 2015-04-18 DIAGNOSIS — I4819 Other persistent atrial fibrillation: Secondary | ICD-10-CM

## 2015-04-18 DIAGNOSIS — Z7901 Long term (current) use of anticoagulants: Secondary | ICD-10-CM | POA: Diagnosis not present

## 2015-04-18 DIAGNOSIS — I481 Persistent atrial fibrillation: Secondary | ICD-10-CM | POA: Diagnosis not present

## 2015-04-18 DIAGNOSIS — I480 Paroxysmal atrial fibrillation: Secondary | ICD-10-CM

## 2015-04-18 LAB — POCT INR: INR: 2.7

## 2015-04-19 ENCOUNTER — Telehealth: Payer: Self-pay | Admitting: Internal Medicine

## 2015-04-19 NOTE — Telephone Encounter (Signed)
Routed message to  [facility] office- Office closed at present time

## 2015-04-19 NOTE — Telephone Encounter (Signed)
Spoke to [place]  Patient was seen by Alinda Dooms NP, had labs drawn yesterday   Sherrie would like Dr [name] review results .  Kidney value has decrease , more anemic  Judeth Cornfield states there office has already contacted patient.   RN will give labs to DR [facility] to review and contact office with his comments

## 2015-04-19 NOTE — Telephone Encounter (Signed)
The numbers are better than they were in 11/2014 (creatinine was 3 and H/H was 7/21).   Dr. Rexene Edison

## 2015-04-20 ENCOUNTER — Emergency Department (HOSPITAL_COMMUNITY)
Admission: EM | Admit: 2015-04-20 | Discharge: 2015-04-20 | Disposition: A | Discharge status: Home / Self Care | Payer: Medicare Other | Attending: Emergency Medicine | Admitting: Emergency Medicine

## 2015-04-20 ENCOUNTER — Encounter (HOSPITAL_COMMUNITY): Payer: Self-pay

## 2015-04-20 DIAGNOSIS — E162 Hypoglycemia, unspecified: Secondary | ICD-10-CM

## 2015-04-20 DIAGNOSIS — G629 Polyneuropathy, unspecified: Secondary | ICD-10-CM | POA: Insufficient documentation

## 2015-04-20 DIAGNOSIS — F419 Anxiety disorder, unspecified: Secondary | ICD-10-CM | POA: Diagnosis not present

## 2015-04-20 DIAGNOSIS — I480 Paroxysmal atrial fibrillation: Secondary | ICD-10-CM | POA: Insufficient documentation

## 2015-04-20 DIAGNOSIS — E11649 Type 2 diabetes mellitus with hypoglycemia without coma: Secondary | ICD-10-CM | POA: Insufficient documentation

## 2015-04-20 DIAGNOSIS — Z8719 Personal history of other diseases of the digestive system: Secondary | ICD-10-CM | POA: Insufficient documentation

## 2015-04-20 DIAGNOSIS — Z79899 Other long term (current) drug therapy: Secondary | ICD-10-CM | POA: Diagnosis not present

## 2015-04-20 DIAGNOSIS — I5033 Acute on chronic diastolic (congestive) heart failure: Secondary | ICD-10-CM | POA: Insufficient documentation

## 2015-04-20 DIAGNOSIS — I10 Essential (primary) hypertension: Secondary | ICD-10-CM | POA: Diagnosis not present

## 2015-04-20 DIAGNOSIS — J449 Chronic obstructive pulmonary disease, unspecified: Secondary | ICD-10-CM | POA: Diagnosis not present

## 2015-04-20 LAB — CBG MONITORING, ED
GLUCOSE-CAPILLARY: 114 mg/dL — AB (ref 65–99)
Glucose-Capillary: 81 mg/dL (ref 65–99)
Glucose-Capillary: 74 mg/dL (ref 65–99)
Glucose-Capillary: 117 mg/dL — ABNORMAL HIGH (ref 65–99)
Glucose-Capillary: 91 mg/dL (ref 65–99)

## 2015-04-20 LAB — BASIC METABOLIC PANEL
ANION GAP: 7 (ref 5–15)
BUN: 44 mg/dL — ABNORMAL HIGH (ref 6–20)
CALCIUM: 9 mg/dL (ref 8.9–10.3)
CO2: 30 mmol/L (ref 22–32)
Chloride: 106 mmol/L (ref 101–111)
Creatinine, Ser: 2.33 mg/dL — ABNORMAL HIGH (ref 0.44–1.00)
GFR, EST AFRICAN AMERICAN: 21 mL/min — AB (ref >60)
GFR, EST NON AFRICAN AMERICAN: 18 mL/min — AB (ref >60)
Glucose, Bld: 76 mg/dL (ref 65–99)
Potassium: 3.6 mmol/L (ref 3.5–5.1)
Sodium: 143 mmol/L (ref 135–145)

## 2015-04-20 LAB — CBC WITH DIFFERENTIAL/PLATELET
BASOS ABS: 0 10*3/uL (ref 0.0–0.1)
Basophils Relative: 1 %
Eosinophils Absolute: 0.1 10*3/uL (ref 0.0–0.7)
Eosinophils Relative: 2 %
HCT: 33.5 % — ABNORMAL LOW (ref 36.0–46.0)
HEMOGLOBIN: 10.7 g/dL — AB (ref 12.0–15.0)
LYMPHS PCT: 15 %
Lymphs Abs: 0.7 10*3/uL (ref 0.7–4.0)
MCH: 25.3 pg — ABNORMAL LOW (ref 26.0–34.0)
MCHC: 31.9 g/dL (ref 30.0–36.0)
MCV: 79.2 fL (ref 78.0–100.0)
Monocytes Absolute: 0.2 10*3/uL (ref 0.1–1.0)
Monocytes Relative: 5 %
Neutro Abs: 3.7 10*3/uL (ref 1.7–7.7)
Neutrophils Relative %: 77 %
Platelets: 200 10*3/uL (ref 150–400)
RBC: 4.23 MIL/uL (ref 3.87–5.11)
RDW: 21.6 % — ABNORMAL HIGH (ref 11.5–15.5)
WBC: 4.7 10*3/uL (ref 4.0–10.5)

## 2015-04-20 LAB — PROTIME-INR
INR: 2.66 — ABNORMAL HIGH (ref 0.00–1.49)
PROTHROMBIN TIME: 28 s — AB (ref 11.6–15.2)

## 2015-04-20 MED ORDER — ALBUTEROL SULFATE HFA 108 (90 BASE) MCG/ACT IN AERS
2.0000 | INHALATION_SPRAY | RESPIRATORY_TRACT | Status: DC | PRN
  Administered 2015-04-20: 2 via RESPIRATORY_TRACT
  Filled 2015-04-20: qty 6.7

## 2015-04-20 NOTE — ED Notes (Signed)
Phlebotomy at the bedside  

## 2015-04-20 NOTE — ED Notes (Signed)
Per EMS, Son found patient obtunded in the living room. Son called EMS. When EMS arrived, pt's CBG 45. Patient was given D50 at 0730. Pt became alert and oriented x4. Pt was then able to eat something. Pt's CBG increased to 150s and Last CBG 114. Patient is alert and oriented x4 upon arrival to the ED. Denies nausea, vomiting, diarrhea, and pain. Vitals per EMS: 168/68, 66 HR, 97% on RA.

## 2015-04-20 NOTE — ED Notes (Signed)
MD Belfi at the bedside  

## 2015-04-20 NOTE — ED Provider Notes (Signed)
CSN: [id number]     Arrival date & time 04/20/15  0818 History   First MD Initiated Contact with Patient 04/20/15 [phone]     Chief Complaint  Patient presents with  . Hypoglycemia     (Consider location/radiation/quality/duration/timing/severity/associated sxs/prior Treatment) HPI Comments: Patient presents with hypoglycemia. She has a history of diabetes mellitus. She is on glipizide 10 mg tablets. She's not on insulin injections. She states she normally takes her glipizide every morning. She has not taken it this morning but did take her dose last night. She was found by her son to be less responsive than normal. He was not able to wake her up this morning. EMS was called and her blood sugar was found to be 45. She was given D50 by EMS as well as some graham crackers. Her blood sugar increased to the 150s. The patient states that her blood sugar has frequently been low in the mornings, around 60. She saw her primary care physician yesterday who decreased her glipizide to 5 mg daily. She is not yet changed to this new dose. She denies any other recent illnesses. No cough congestion or fevers. She denies any chest pain or shortness of breath. She denies any urinary symptoms.  Patient is a 81 y.o. female presenting with hypoglycemia.  Hypoglycemia Associated symptoms: no dizziness, no shortness of breath, no speech difficulty, no sweats, no vomiting and no weakness     Past Medical History  Diagnosis Date  . Hypertension   . Neuropathy (HCC)   . Anxiety   . PONV (postoperative nausea and vomiting)   . CHF (congestive heart failure) (HCC)   . COPD (chronic obstructive pulmonary disease) (HCC)     on home, nocturnal oxygen.   . Family history of adverse reaction to anesthesia   . Cardiomyopathy (HCC)   . Diabetes mellitus without complication (HCC)     TYPE 2  . Macular degeneration, bilateral   . Varicose veins   . GIB (gastrointestinal bleeding)   . Paroxysmal atrial fibrillation (HCC)    . Shortness of breath dyspnea   . Acute on chronic diastolic heart failure (HCC) 12/22/2014   Past Surgical History  Procedure Laterality Date  . Hip fracture surgery  2004  . Abdominal hysterectomy    . Vein surgery    . Esophagogastroduodenoscopy N/A 12/01/2014    Procedure: ESOPHAGOGASTRODUODENOSCOPY (EGD);  Surgeon: Hilarie Fredrickson, MD;  Location: [facility] ENDOSCOPY;  Service: Endoscopy;  Laterality: N/A;   Family History  Problem Relation Age of Onset  . Colon cancer Father 46    advanced when discovered, no surgery or therapy.  this led to his death  . Breast cancer Sister   . Cancer Brother   . Breast cancer Mother   . Lung cancer Brother   . Thyroid disease Neg Hx    Social History  Substance Use Topics  . Smoking status: Passive Smoke Exposure - Never Smoker  . Smokeless tobacco: Never Used  . Alcohol Use: No   OB History    No data available     Review of Systems  Constitutional: Negative for fever, chills, diaphoresis and fatigue.  HENT: Negative for congestion, rhinorrhea and sneezing.   Eyes: Negative.   Respiratory: Negative for cough, chest tightness and shortness of breath.   Cardiovascular: Negative for chest pain and leg swelling.  Gastrointestinal: Negative for nausea, vomiting, abdominal pain, diarrhea and blood in stool.  Genitourinary: Negative for frequency, hematuria, flank pain and difficulty urinating.  Musculoskeletal: Negative  for back pain and arthralgias.  Skin: Negative for rash.  Neurological: Negative for dizziness, speech difficulty, weakness, numbness and headaches.      Allergies  Other; Codeine; Penicillins; Procaine; and Yellow dyes (non-tartrazine)  Home Medications   Prior to Admission medications   Medication Sig Start Date End Date Taking? Authorizing Provider  acetaminophen (TYLENOL) 325 MG tablet Take 650 mg by mouth every 6 (six) hours as needed for mild pain.     Historical Provider, MD  albuterol (PROVENTIL HFA;VENTOLIN HFA)  108 (90 BASE) MCG/ACT inhaler Inhale 2 puffs into the lungs every 6 (six) hours as needed for wheezing or shortness of breath. 06/11/13   Nishant Dhungel, MD  albuterol (PROVENTIL) (2.5 MG/3ML) 0.083% nebulizer solution Take 3 mLs (2.5 mg total) by nebulization every 6 (six) hours as needed for wheezing or shortness of breath. 12/24/14   Lora Paula, MD  atorvastatin (LIPITOR) 20 MG tablet Take 20 mg by mouth at bedtime. 10/12/14 10/12/15  Historical Provider, MD  cholecalciferol (VITAMIN D) 1000 UNITS tablet Take 1,000 Units by mouth at bedtime.     Historical Provider, MD  escitalopram (LEXAPRO) 10 MG tablet Take 10 mg by mouth daily.    Historical Provider, MD  fenofibrate 160 MG tablet Take 160 mg by mouth daily.    Historical Provider, MD  gabapentin (NEURONTIN) 100 MG capsule Take 100-200 mg by mouth 2 (two) times daily. Take 1 capsule (100 mg) morning, take 2 capsules (200 mg) at bedtime    Historical Provider, MD  glipiZIDE (GLUCOTROL XL) 10 MG 24 hr tablet Take 10 mg by mouth daily. 10/11/14   Historical Provider, MD  hydrALAZINE (APRESOLINE) 25 MG tablet Take 25 mg by mouth 2 (two) times daily. 12/05/14   Historical Provider, MD  hydrALAZINE (APRESOLINE) 25 MG tablet TAKE 1 TABLET (25 MG TOTAL) BY MOUTH 2 (TWO) TIMES DAILY. 01/31/15   Chrystie Nose, MD  iron polysaccharides (NIFEREX) 150 MG capsule Take 1 capsule (150 mg total) by mouth 2 (two) times daily. 12/02/14   Lora Paula, MD  isosorbide mononitrate (IMDUR) 30 MG 24 hr tablet Take 30 mg by mouth daily. 12/05/14   Historical Provider, MD  isosorbide mononitrate (IMDUR) 30 MG 24 hr tablet TAKE 1 TABLET BY MOUTH EVERY DAY 01/31/15   Chrystie Nose, MD  methimazole (TAPAZOLE) 5 MG tablet Take 1 tablet (5 mg total) by mouth 3 (three) times a week. 01/23/15   Romero Belling, MD  metoprolol tartrate (LOPRESSOR) 25 MG tablet TAKE 1 TABLET BY MOUTH TWICE A DAY 02/27/15   Chrystie Nose, MD  Multiple Vitamin (MULTIVITAMIN WITH MINERALS) TABS  tablet Take 1 tablet by mouth daily.    Historical Provider, MD  OXYGEN Inhale into the lungs.    Historical Provider, MD  pantoprazole (PROTONIX) 40 MG tablet Take 1 tablet (40 mg total) by mouth daily. 12/02/14   Lora Paula, MD  potassium chloride SA (K-DUR,KLOR-CON) 20 MEQ tablet Take 1 tablet (20 mEq total) by mouth daily. 12/25/14   Lora Paula, MD  Pramoxine-Menthol (GOLD BOND MAXIMUM RELIEF) 1-1 % CREA Apply 1 application topically 2 (two) times daily as needed (for would care).    Historical Provider, MD  predniSONE (DELTASONE) 20 MG tablet Take 2 tablets (40 mg total) by mouth daily with breakfast. 12/25/14   Lora Paula, MD  torsemide (DEMADEX) 20 MG tablet Take 2 tablets (40 mg total) by mouth 2 (two) times daily. 04/12/15   Lisette Abu  Hilty, MD  warfarin (COUMADIN) 2.5 MG tablet Take 1 tablet (2.5 mg total) by mouth daily. 12/26/14   Chrystie Nose, MD   BP 130/49 mmHg  Pulse 76  Temp(Src) 97.4 F (36.3 C) (Oral)  Resp 15  SpO2 92% Physical Exam  Constitutional: She is oriented to person, place, and time. She appears well-developed and well-nourished.  HENT:  Head: Normocephalic and atraumatic.  Eyes: Pupils are equal, round, and reactive to light.  Neck: Normal range of motion. Neck supple.  Cardiovascular: Normal rate, regular rhythm and normal heart sounds.   Pulmonary/Chest: Effort normal. No respiratory distress. She has wheezes. She has no rales. She exhibits no tenderness.  Mild expiratory wheezes  Abdominal: Soft. Bowel sounds are normal. There is no tenderness. There is no rebound and no guarding.  Musculoskeletal: Normal range of motion. She exhibits no edema.  Lymphadenopathy:    She has no cervical adenopathy.  Neurological: She is alert and oriented to person, place, and time.  Skin: Skin is warm and dry. No rash noted.  Psychiatric: She has a normal mood and affect.    ED Course  Procedures (including critical care time) Labs Review Labs  Reviewed  BASIC METABOLIC PANEL - Abnormal; Notable for the following:    BUN 44 (*)    Creatinine, Ser 2.33 (*)    GFR calc non Af Amer 18 (*)    GFR calc Af Amer 21 (*)    All other components within normal limits  CBC WITH DIFFERENTIAL/PLATELET - Abnormal; Notable for the following:    Hemoglobin 10.7 (*)    HCT 33.5 (*)    MCH 25.3 (*)    RDW 21.6 (*)    All other components within normal limits  PROTIME-INR - Abnormal; Notable for the following:    Prothrombin Time 28.0 (*)    INR 2.66 (*)    All other components within normal limits  CBG MONITORING, ED - Abnormal; Notable for the following:    Glucose-Capillary 117 (*)    All other components within normal limits  CBG MONITORING, ED - Abnormal; Notable for the following:    Glucose-Capillary 114 (*)    All other components within normal limits  CBG MONITORING, ED  CBG MONITORING, ED  CBG MONITORING, ED    Imaging Review No results found. I have personally reviewed and evaluated these images and lab results as part of my medical decision-making.   EKG Interpretation None      MDM   Final diagnoses:  Hypoglycemia    Patient presents with hypoglycemia. She is on glipizide but did not take it today. Her last use was yesterday morning. She was monitored here for about 6 hours and had no episodes of hypoglycemia. Her other labs are unremarkable. She was discharged home in good condition. She was advised not to take her glipizide today but to start tomorrow at her lower dose. She was encouraged to have close follow-up with her PCP. Her creatinine is elevated but is similar to her baseline values.    Rolan Bucco, MD 04/20/15 [phone]

## 2015-04-20 NOTE — ED Notes (Signed)
Ordered Meal Tray  

## 2015-04-20 NOTE — Discharge Instructions (Signed)
Do not take your glipizide today.  Start tomorrow at your 5mg  dose.  Hypoglycemia Hypoglycemia occurs when the glucose in your blood is too low. Glucose is a type of sugar that is your body's main energy source. Hormones, such as insulin and glucagon, control the level of glucose in the blood. Insulin lowers blood glucose and glucagon increases blood glucose. Having too much insulin in your blood stream, or not eating enough food containing sugar, can result in hypoglycemia. Hypoglycemia can happen to people with or without diabetes. It can develop quickly and can be a medical emergency.  CAUSES   Missing or delaying meals.  Not eating enough carbohydrates at meals.  Taking too much diabetes medicine.  Not timing your oral diabetes medicine or insulin doses with meals, snacks, and exercise.  Nausea and vomiting.  Certain medicines.  Severe illnesses, such as hepatitis, kidney disorders, and certain eating disorders.  Increased activity or exercise without eating something extra or adjusting medicines.  Drinking too much alcohol.  A nerve disorder that affects body functions like your heart rate, blood pressure, and digestion (autonomic neuropathy).  A condition where the stomach muscles do not function properly (gastroparesis). Therefore, medicines and food may not absorb properly.  Rarely, a tumor of the pancreas can produce too much insulin. SYMPTOMS   Hunger.  Sweating (diaphoresis).  Change in body temperature.  Shakiness.  Headache.  Anxiety.  Lightheadedness.  Irritability.  Difficulty concentrating.  Dry mouth.  Tingling or numbness in the hands or feet.  Restless sleep or sleep disturbances.  Altered speech and coordination.  Change in mental status.  Seizures or prolonged convulsions.  Combativeness.  Drowsiness (lethargic).  Weakness.  Increased heart rate or palpitations.  Confusion.  Pale, gray skin color.  Blurred or double  vision.  Fainting. DIAGNOSIS  A physical exam and medical history will be performed. Your caregiver may make a diagnosis based on your symptoms. Blood tests and other lab tests may be performed to confirm a diagnosis. Once the diagnosis is made, your caregiver will see if your signs and symptoms go away once your blood glucose is raised.  TREATMENT  Usually, you can easily treat your hypoglycemia when you notice symptoms.  Check your blood glucose. If it is less than 70 mg/dl, take one of the following:   3-4 glucose tablets.    cup juice.    cup regular soda.   1 cup skim milk.   -1 tube of glucose gel.   5-6 hard candies.   Avoid high-fat drinks or food that may delay a rise in blood glucose levels.  Do not take more than the recommended amount of sugary foods, drinks, gel, or tablets. Doing so will cause your blood glucose to go too high.   Wait 10-15 minutes and recheck your blood glucose. If it is still less than 70 mg/dl or below your target range, repeat treatment.   Eat a snack if it is more than 1 hour until your next meal.  There may be a time when your blood glucose may go so low that you are unable to treat yourself at home when you start to notice symptoms. You may need someone to help you. You may even faint or be unable to swallow. If you cannot treat yourself, someone will need to bring you to the hospital.  HOME CARE INSTRUCTIONS  If you have diabetes, follow your diabetes management plan by:  Taking your medicines as directed.  Following your exercise plan.  Following  your meal plan. Do not skip meals. Eat on time.  Testing your blood glucose regularly. Check your blood glucose before and after exercise. If you exercise longer or different than usual, be sure to check blood glucose more frequently.  Wearing your medical alert jewelry that says you have diabetes.  Identify the cause of your hypoglycemia. Then, develop ways to prevent the  recurrence of hypoglycemia.  Do not take a hot bath or shower right after an insulin shot.  Always carry treatment with you. Glucose tablets are the easiest to carry.  If you are going to drink alcohol, drink it only with meals.  Tell friends or family members ways to keep you safe during a seizure. This may include removing hard or sharp objects from the area or turning you on your side.  Maintain a healthy weight. SEEK MEDICAL CARE IF:   You are having problems keeping your blood glucose in your target range.  You are having frequent episodes of hypoglycemia.  You feel you might be having side effects from your medicines.  You are not sure why your blood glucose is dropping so low.  You notice a change in vision or a new problem with your vision. SEEK IMMEDIATE MEDICAL CARE IF:   Confusion develops.  A change in mental status occurs.  The inability to swallow develops.  Fainting occurs.   This information is not intended to replace advice given to you by your health care provider. Make sure you discuss any questions you have with your health care provider.   Document Released: 05/13/2005 Document Revised: 05/18/2013 Document Reviewed: 01/17/2015 Elsevier Interactive Patient Education [employer]

## 2015-04-21 ENCOUNTER — Other Ambulatory Visit: Payer: Self-pay | Admitting: Internal Medicine

## 2015-05-08 ENCOUNTER — Other Ambulatory Visit: Payer: Self-pay | Admitting: Internal Medicine

## 2015-05-08 ENCOUNTER — Ambulatory Visit: Payer: Medicare Other | Admitting: Pharmacist Clinician (PhC)/ Clinical Pharmacy Specialist

## 2015-06-11 ENCOUNTER — Emergency Department (HOSPITAL_COMMUNITY): Payer: Medicare Other

## 2015-06-11 ENCOUNTER — Inpatient Hospital Stay (HOSPITAL_COMMUNITY)
Admission: EM | Admit: 2015-06-11 | Discharge: 2015-06-15 | DRG: 189 | Disposition: A | Discharge status: Home Health | Payer: Medicare Other | Attending: Family Medicine | Admitting: Family Medicine

## 2015-06-11 ENCOUNTER — Encounter (HOSPITAL_COMMUNITY): Payer: Self-pay | Admitting: Emergency Medicine

## 2015-06-11 DIAGNOSIS — E1122 Type 2 diabetes mellitus with diabetic chronic kidney disease: Secondary | ICD-10-CM | POA: Diagnosis present

## 2015-06-11 DIAGNOSIS — E0590 Thyrotoxicosis, unspecified without thyrotoxic crisis or storm: Secondary | ICD-10-CM | POA: Diagnosis present

## 2015-06-11 DIAGNOSIS — I5032 Chronic diastolic (congestive) heart failure: Secondary | ICD-10-CM

## 2015-06-11 DIAGNOSIS — H3530 Unspecified macular degeneration: Secondary | ICD-10-CM | POA: Diagnosis present

## 2015-06-11 DIAGNOSIS — Z79899 Other long term (current) drug therapy: Secondary | ICD-10-CM

## 2015-06-11 DIAGNOSIS — Z90710 Acquired absence of both cervix and uterus: Secondary | ICD-10-CM | POA: Diagnosis not present

## 2015-06-11 DIAGNOSIS — Z7952 Long term (current) use of systemic steroids: Secondary | ICD-10-CM

## 2015-06-11 DIAGNOSIS — Z7984 Long term (current) use of oral hypoglycemic drugs: Secondary | ICD-10-CM | POA: Diagnosis not present

## 2015-06-11 DIAGNOSIS — I10 Essential (primary) hypertension: Secondary | ICD-10-CM | POA: Diagnosis present

## 2015-06-11 DIAGNOSIS — Z885 Allergy status to narcotic agent status: Secondary | ICD-10-CM

## 2015-06-11 DIAGNOSIS — Z7982 Long term (current) use of aspirin: Secondary | ICD-10-CM | POA: Diagnosis not present

## 2015-06-11 DIAGNOSIS — D649 Anemia, unspecified: Secondary | ICD-10-CM | POA: Diagnosis present

## 2015-06-11 DIAGNOSIS — E1140 Type 2 diabetes mellitus with diabetic neuropathy, unspecified: Secondary | ICD-10-CM | POA: Diagnosis present

## 2015-06-11 DIAGNOSIS — J9620 Acute and chronic respiratory failure, unspecified whether with hypoxia or hypercapnia: Secondary | ICD-10-CM | POA: Diagnosis present

## 2015-06-11 DIAGNOSIS — E1165 Type 2 diabetes mellitus with hyperglycemia: Secondary | ICD-10-CM | POA: Diagnosis present

## 2015-06-11 DIAGNOSIS — J811 Chronic pulmonary edema: Secondary | ICD-10-CM | POA: Diagnosis present

## 2015-06-11 DIAGNOSIS — Z66 Do not resuscitate: Secondary | ICD-10-CM | POA: Diagnosis present

## 2015-06-11 DIAGNOSIS — J9621 Acute and chronic respiratory failure with hypoxia: Secondary | ICD-10-CM | POA: Diagnosis not present

## 2015-06-11 DIAGNOSIS — F3181 Bipolar II disorder: Secondary | ICD-10-CM | POA: Diagnosis present

## 2015-06-11 DIAGNOSIS — J441 Chronic obstructive pulmonary disease with (acute) exacerbation: Secondary | ICD-10-CM | POA: Diagnosis present

## 2015-06-11 DIAGNOSIS — R0602 Shortness of breath: Secondary | ICD-10-CM

## 2015-06-11 DIAGNOSIS — Z794 Long term (current) use of insulin: Secondary | ICD-10-CM

## 2015-06-11 DIAGNOSIS — I248 Other forms of acute ischemic heart disease: Secondary | ICD-10-CM | POA: Diagnosis present

## 2015-06-11 DIAGNOSIS — N289 Disorder of kidney and ureter, unspecified: Secondary | ICD-10-CM

## 2015-06-11 DIAGNOSIS — I132 Hypertensive heart and chronic kidney disease with heart failure and with stage 5 chronic kidney disease, or end stage renal disease: Secondary | ICD-10-CM | POA: Diagnosis present

## 2015-06-11 DIAGNOSIS — F419 Anxiety disorder, unspecified: Secondary | ICD-10-CM | POA: Diagnosis present

## 2015-06-11 DIAGNOSIS — Z9981 Dependence on supplemental oxygen: Secondary | ICD-10-CM | POA: Diagnosis not present

## 2015-06-11 DIAGNOSIS — Z880 Allergy status to penicillin: Secondary | ICD-10-CM | POA: Diagnosis not present

## 2015-06-11 DIAGNOSIS — Z9109 Other allergy status, other than to drugs and biological substances: Secondary | ICD-10-CM

## 2015-06-11 DIAGNOSIS — N185 Chronic kidney disease, stage 5: Secondary | ICD-10-CM | POA: Diagnosis present

## 2015-06-11 DIAGNOSIS — I509 Heart failure, unspecified: Secondary | ICD-10-CM | POA: Diagnosis not present

## 2015-06-11 DIAGNOSIS — E1159 Type 2 diabetes mellitus with other circulatory complications: Secondary | ICD-10-CM | POA: Diagnosis not present

## 2015-06-11 DIAGNOSIS — I5033 Acute on chronic diastolic (congestive) heart failure: Secondary | ICD-10-CM | POA: Diagnosis present

## 2015-06-11 DIAGNOSIS — Z888 Allergy status to other drugs, medicaments and biological substances status: Secondary | ICD-10-CM

## 2015-06-11 DIAGNOSIS — Z7901 Long term (current) use of anticoagulants: Secondary | ICD-10-CM

## 2015-06-11 DIAGNOSIS — IMO0002 Reserved for concepts with insufficient information to code with codable children: Secondary | ICD-10-CM | POA: Diagnosis present

## 2015-06-11 DIAGNOSIS — I480 Paroxysmal atrial fibrillation: Secondary | ICD-10-CM

## 2015-06-11 LAB — BRAIN NATRIURETIC PEPTIDE: B Natriuretic Peptide: 914.1 pg/mL — ABNORMAL HIGH (ref 0.0–100.0)

## 2015-06-11 LAB — BASIC METABOLIC PANEL
Anion gap: 15 (ref 5–15)
BUN: 51 mg/dL — AB (ref 6–20)
CHLORIDE: 100 mmol/L — AB (ref 101–111)
CO2: 27 mmol/L (ref 22–32)
Calcium: 9.1 mg/dL (ref 8.9–10.3)
Creatinine, Ser: 2.59 mg/dL — ABNORMAL HIGH (ref 0.44–1.00)
GFR calc Af Amer: 19 mL/min — ABNORMAL LOW (ref >60)
GFR calc non Af Amer: 16 mL/min — ABNORMAL LOW (ref >60)
GLUCOSE: 174 mg/dL — AB (ref 65–99)
POTASSIUM: 4.1 mmol/L (ref 3.5–5.1)
Sodium: 142 mmol/L (ref 135–145)

## 2015-06-11 LAB — CBC
HCT: 30.2 % — ABNORMAL LOW (ref 36.0–46.0)
Hemoglobin: 9.1 g/dL — ABNORMAL LOW (ref 12.0–15.0)
MCH: 25.7 pg — ABNORMAL LOW (ref 26.0–34.0)
MCHC: 30.1 g/dL (ref 30.0–36.0)
MCV: 85.3 fL (ref 78.0–100.0)
Platelets: 311 10*3/uL (ref 150–400)
RBC: 3.54 MIL/uL — ABNORMAL LOW (ref 3.87–5.11)
RDW: 19 % — ABNORMAL HIGH (ref 11.5–15.5)
WBC: 8.5 10*3/uL (ref 4.0–10.5)

## 2015-06-11 LAB — PROTIME-INR
INR: 2.69 — ABNORMAL HIGH (ref 0.00–1.49)
Prothrombin Time: 28.2 seconds — ABNORMAL HIGH (ref 11.6–15.2)

## 2015-06-11 LAB — I-STAT TROPONIN, ED: Troponin i, poc: 0.02 ng/mL (ref 0.00–0.08)

## 2015-06-11 MED ORDER — METOPROLOL TARTRATE 25 MG PO TABS
25.0000 mg | ORAL_TABLET | Freq: Two times a day (BID) | ORAL | Status: DC
  Administered 2015-06-12 – 2015-06-15 (×7): 25 mg via ORAL
  Filled 2015-06-11 (×8): qty 1

## 2015-06-11 MED ORDER — GABAPENTIN 100 MG PO CAPS
100.0000 mg | ORAL_CAPSULE | Freq: Every day | ORAL | Status: DC
  Administered 2015-06-12 – 2015-06-15 (×4): 100 mg via ORAL
  Filled 2015-06-11 (×4): qty 1

## 2015-06-11 MED ORDER — METHIMAZOLE 5 MG PO TABS
5.0000 mg | ORAL_TABLET | Freq: Every day | ORAL | Status: DC
  Administered 2015-06-12 – 2015-06-15 (×4): 5 mg via ORAL
  Filled 2015-06-11 (×4): qty 1

## 2015-06-11 MED ORDER — ESCITALOPRAM OXALATE 10 MG PO TABS
10.0000 mg | ORAL_TABLET | Freq: Every day | ORAL | Status: DC
  Administered 2015-06-12 – 2015-06-15 (×4): 10 mg via ORAL
  Filled 2015-06-11 (×4): qty 1

## 2015-06-11 MED ORDER — NITROGLYCERIN 2 % TD OINT
1.0000 [in_us] | TOPICAL_OINTMENT | Freq: Once | TRANSDERMAL | Status: AC
  Administered 2015-06-11: 1 [in_us] via TOPICAL
  Filled 2015-06-11: qty 1

## 2015-06-11 MED ORDER — POTASSIUM CHLORIDE CRYS ER 20 MEQ PO TBCR
20.0000 meq | EXTENDED_RELEASE_TABLET | Freq: Every day | ORAL | Status: DC
  Administered 2015-06-12 – 2015-06-15 (×4): 20 meq via ORAL
  Filled 2015-06-11 (×4): qty 1

## 2015-06-11 MED ORDER — ONDANSETRON HCL 4 MG PO TABS: 4.0000 mg | ORAL_TABLET | Freq: Four times a day (QID) | ORAL | Status: DC | PRN

## 2015-06-11 MED ORDER — GABAPENTIN 100 MG PO CAPS
200.0000 mg | ORAL_CAPSULE | Freq: Every day | ORAL | Status: DC
  Administered 2015-06-12 – 2015-06-14 (×4): 200 mg via ORAL
  Filled 2015-06-11 (×4): qty 2

## 2015-06-11 MED ORDER — ONDANSETRON HCL 4 MG/2ML IJ SOLN: 4.0000 mg | Freq: Four times a day (QID) | INTRAMUSCULAR | Status: DC | PRN

## 2015-06-11 MED ORDER — ADULT MULTIVITAMIN W/MINERALS CH
1.0000 | ORAL_TABLET | Freq: Every day | ORAL | Status: DC
  Administered 2015-06-12 – 2015-06-15 (×4): 1 via ORAL
  Filled 2015-06-11 (×4): qty 1

## 2015-06-11 MED ORDER — ASPIRIN 81 MG PO CHEW: 324.0000 mg | CHEWABLE_TABLET | Freq: Once | ORAL | Status: DC

## 2015-06-11 MED ORDER — POLYETHYLENE GLYCOL 3350 17 G PO PACK: 17.0000 g | PACK | Freq: Every day | ORAL | Status: DC | PRN

## 2015-06-11 MED ORDER — PANTOPRAZOLE SODIUM 40 MG PO TBEC
40.0000 mg | DELAYED_RELEASE_TABLET | Freq: Every day | ORAL | Status: DC
  Administered 2015-06-12 – 2015-06-15 (×5): 40 mg via ORAL
  Filled 2015-06-11 (×5): qty 1

## 2015-06-11 MED ORDER — FUROSEMIDE 10 MG/ML IJ SOLN
40.0000 mg | Freq: Two times a day (BID) | INTRAMUSCULAR | Status: DC
Start: 2015-06-12 — End: 2015-06-12
  Administered 2015-06-12: 40 mg via INTRAVENOUS
  Filled 2015-06-11: qty 4

## 2015-06-11 MED ORDER — INSULIN ASPART 100 UNIT/ML ~~LOC~~ SOLN
0.0000 [IU] | Freq: Three times a day (TID) | SUBCUTANEOUS | Status: DC
  Administered 2015-06-12: 3 [IU] via SUBCUTANEOUS
  Administered 2015-06-12: 5 [IU] via SUBCUTANEOUS
  Administered 2015-06-12: 2 [IU] via SUBCUTANEOUS
  Administered 2015-06-13 – 2015-06-14 (×3): 3 [IU] via SUBCUTANEOUS
  Administered 2015-06-14 – 2015-06-15 (×2): 5 [IU] via SUBCUTANEOUS

## 2015-06-11 MED ORDER — ALBUTEROL SULFATE (2.5 MG/3ML) 0.083% IN NEBU: 2.5000 mg | INHALATION_SOLUTION | RESPIRATORY_TRACT | Status: DC | PRN

## 2015-06-11 MED ORDER — ISOSORBIDE MONONITRATE ER 30 MG PO TB24
30.0000 mg | ORAL_TABLET | Freq: Every day | ORAL | Status: DC
Start: 2015-06-12 — End: 2015-06-15
  Administered 2015-06-12 – 2015-06-15 (×3): 30 mg via ORAL
  Filled 2015-06-11 (×4): qty 1

## 2015-06-11 MED ORDER — ATORVASTATIN CALCIUM 20 MG PO TABS
20.0000 mg | ORAL_TABLET | Freq: Every day | ORAL | Status: DC
  Administered 2015-06-12 – 2015-06-14 (×4): 20 mg via ORAL
  Filled 2015-06-11 (×4): qty 1

## 2015-06-11 MED ORDER — LEVALBUTEROL HCL 0.63 MG/3ML IN NEBU
1.2500 mg | INHALATION_SOLUTION | Freq: Four times a day (QID) | RESPIRATORY_TRACT | Status: DC
  Administered 2015-06-12: 1.25 mg via RESPIRATORY_TRACT
  Filled 2015-06-11: qty 6

## 2015-06-11 MED ORDER — IPRATROPIUM BROMIDE 0.02 % IN SOLN
0.5000 mg | Freq: Once | RESPIRATORY_TRACT | Status: AC
  Administered 2015-06-11: 0.5 mg via RESPIRATORY_TRACT
  Filled 2015-06-11: qty 2.5

## 2015-06-11 MED ORDER — ALBUTEROL (5 MG/ML) CONTINUOUS INHALATION SOLN
15.0000 mg/h | INHALATION_SOLUTION | Freq: Once | RESPIRATORY_TRACT | Status: AC
  Administered 2015-06-11: 15 mg/h via RESPIRATORY_TRACT
  Filled 2015-06-11: qty 20

## 2015-06-11 MED ORDER — ASPIRIN EC 81 MG PO TBEC: 81.0000 mg | DELAYED_RELEASE_TABLET | Freq: Four times a day (QID) | ORAL | Status: DC | PRN

## 2015-06-11 MED ORDER — MAGNESIUM SULFATE 2 GM/50ML IV SOLN
2.0000 g | Freq: Once | INTRAVENOUS | Status: AC
  Administered 2015-06-11: 2 g via INTRAVENOUS
  Filled 2015-06-11: qty 50

## 2015-06-11 MED ORDER — ALPRAZOLAM 0.25 MG PO TABS
0.2500 mg | ORAL_TABLET | Freq: Every evening | ORAL | Status: DC | PRN
  Administered 2015-06-12: 0.25 mg via ORAL
  Filled 2015-06-11 (×2): qty 1

## 2015-06-11 MED ORDER — VITAMIN D 1000 UNITS PO TABS
1000.0000 [IU] | ORAL_TABLET | Freq: Every morning | ORAL | Status: DC
  Administered 2015-06-12 – 2015-06-15 (×4): 1000 [IU] via ORAL
  Filled 2015-06-11 (×4): qty 1

## 2015-06-11 MED ORDER — GLIPIZIDE ER 5 MG PO TB24
5.0000 mg | ORAL_TABLET | Freq: Every day | ORAL | Status: DC
  Administered 2015-06-12 – 2015-06-13 (×2): 5 mg via ORAL
  Filled 2015-06-11 (×4): qty 1

## 2015-06-11 MED ORDER — HYDRALAZINE HCL 25 MG PO TABS
25.0000 mg | ORAL_TABLET | Freq: Two times a day (BID) | ORAL | Status: DC
  Administered 2015-06-12 – 2015-06-15 (×7): 25 mg via ORAL
  Filled 2015-06-11 (×8): qty 1

## 2015-06-11 MED ORDER — MAGNESIUM SULFATE 2 GM/50ML IV SOLN
2.0000 g | Freq: Once | INTRAVENOUS | Status: AC
  Administered 2015-06-12: 2 g via INTRAVENOUS
  Filled 2015-06-11: qty 50

## 2015-06-11 MED ORDER — FENOFIBRATE 160 MG PO TABS
160.0000 mg | ORAL_TABLET | Freq: Every day | ORAL | Status: DC
  Administered 2015-06-12 – 2015-06-15 (×4): 160 mg via ORAL
  Filled 2015-06-11 (×4): qty 1

## 2015-06-11 MED ORDER — IPRATROPIUM BROMIDE 0.02 % IN SOLN
0.5000 mg | Freq: Four times a day (QID) | RESPIRATORY_TRACT | Status: DC
  Administered 2015-06-12: 0.5 mg via RESPIRATORY_TRACT
  Filled 2015-06-11: qty 2.5

## 2015-06-11 NOTE — ED Provider Notes (Signed)
CSN: [id number]     Arrival date & time 06/11/15  1703 History   First MD Initiated Contact with Patient 06/11/15 1729     Chief Complaint  Patient presents with  . Shortness of Breath     (Consider location/radiation/quality/duration/timing/severity/associated sxs/prior Treatment) HPI   82 year old female with history of COPD currently on home oxygen, CHF, hypertension, diabetes, paroxysmal atrial fibrillation currently on warfarin, anxiety brought here via EMS from home for evaluation of shortness of breath. Patient reports acute onset of shortness of breath, difficulty catching a deep breath, and having increased wheezing that started this afternoon while she was eating lunch. Symptom has been persistent but improves after patient receiving 5 mg of albuterol and 125 mg of Solu-Medrol he EMS prior to arrival. She states she wears supplemental oxygen at home at 3 L and she has not had any increase shortness of breath within the past several days. No recent sickness. She denies having fever, chills, headache, chest pain, diaphoresis, abdominal pain, nausea vomiting diarrhea, or rash. Patient felt her symptom is similar to prior COPD exacerbation. Denies any prior history of intubation or ICU stay.  Pt sleep in a recliner.  Laying flat worsen her sxs.      Past Medical History  Diagnosis Date  . Hypertension   . Neuropathy (HCC)   . Anxiety   . PONV (postoperative nausea and vomiting)   . CHF (congestive heart failure) (HCC)   . COPD (chronic obstructive pulmonary disease) (HCC)     on home, nocturnal oxygen.   . Family history of adverse reaction to anesthesia   . Cardiomyopathy (HCC)   . Diabetes mellitus without complication (HCC)     TYPE 2  . Macular degeneration, bilateral   . Varicose veins   . GIB (gastrointestinal bleeding)   . Paroxysmal atrial fibrillation (HCC)   . Shortness of breath dyspnea   . Acute on chronic diastolic heart failure (HCC) 12/22/2014   Past Surgical  History  Procedure Laterality Date  . Hip fracture surgery  2004  . Abdominal hysterectomy    . Vein surgery    . Esophagogastroduodenoscopy N/A 12/01/2014    Procedure: ESOPHAGOGASTRODUODENOSCOPY (EGD);  Surgeon: Hilarie Fredrickson, MD;  Location: [facility] ENDOSCOPY;  Service: Endoscopy;  Laterality: N/A;   Family History  Problem Relation Age of Onset  . Colon cancer Father 75    advanced when discovered, no surgery or therapy.  this led to his death  . Breast cancer Sister   . Cancer Brother   . Breast cancer Mother   . Lung cancer Brother   . Thyroid disease Neg Hx    Social History  Substance Use Topics  . Smoking status: Passive Smoke Exposure - Never Smoker  . Smokeless tobacco: Never Used  . Alcohol Use: No   OB History    No data available     Review of Systems  All other systems reviewed and are negative.     Allergies  Other; Codeine; Penicillins; Procaine; and Yellow dyes (non-tartrazine)  Home Medications   Prior to Admission medications   Medication Sig Start Date End Date Taking? Authorizing Provider  acetaminophen (TYLENOL) 325 MG tablet Take 650 mg by mouth every 6 (six) hours as needed for mild pain.     Historical Provider, MD  albuterol (PROVENTIL HFA;VENTOLIN HFA) 108 (90 BASE) MCG/ACT inhaler Inhale 2 puffs into the lungs every 6 (six) hours as needed for wheezing or shortness of breath. 06/11/13   Eddie North, MD  albuterol (PROVENTIL) (2.5 MG/3ML) 0.083% nebulizer solution Take 3 mLs (2.5 mg total) by nebulization every 6 (six) hours as needed for wheezing or shortness of breath. 12/24/14   Lora Paula, MD  atorvastatin (LIPITOR) 20 MG tablet Take 20 mg by mouth at bedtime. 10/12/14 10/12/15  Historical Provider, MD  cholecalciferol (VITAMIN D) 1000 UNITS tablet Take 1,000 Units by mouth at bedtime.     Historical Provider, MD  escitalopram (LEXAPRO) 10 MG tablet Take 10 mg by mouth daily.    Historical Provider, MD  fenofibrate 160 MG tablet Take 160 mg  by mouth daily.    Historical Provider, MD  gabapentin (NEURONTIN) 100 MG capsule Take 100-200 mg by mouth 2 (two) times daily. Take 1 capsule (100 mg) morning, take 2 capsules (200 mg) at bedtime    Historical Provider, MD  glipiZIDE (GLUCOTROL XL) 10 MG 24 hr tablet Take 10 mg by mouth daily. 10/11/14   Historical Provider, MD  hydrALAZINE (APRESOLINE) 25 MG tablet Take 25 mg by mouth 2 (two) times daily. 12/05/14   Historical Provider, MD  hydrALAZINE (APRESOLINE) 25 MG tablet TAKE 1 TABLET (25 MG TOTAL) BY MOUTH 2 (TWO) TIMES DAILY. 01/31/15   Chrystie Nose, MD  iron polysaccharides (NIFEREX) 150 MG capsule Take 1 capsule (150 mg total) by mouth 2 (two) times daily. 12/02/14   Lora Paula, MD  isosorbide mononitrate (IMDUR) 30 MG 24 hr tablet Take 30 mg by mouth daily. 12/05/14   Historical Provider, MD  isosorbide mononitrate (IMDUR) 30 MG 24 hr tablet TAKE 1 TABLET BY MOUTH EVERY DAY 01/31/15   Chrystie Nose, MD  methimazole (TAPAZOLE) 5 MG tablet Take 1 tablet (5 mg total) by mouth 3 (three) times a week. 01/23/15   Romero Belling, MD  metoprolol tartrate (LOPRESSOR) 25 MG tablet TAKE 1 TABLET BY MOUTH TWICE A DAY 02/27/15   Chrystie Nose, MD  Multiple Vitamin (MULTIVITAMIN WITH MINERALS) TABS tablet Take 1 tablet by mouth daily.    Historical Provider, MD  OXYGEN Inhale into the lungs.    Historical Provider, MD  pantoprazole (PROTONIX) 40 MG tablet Take 1 tablet (40 mg total) by mouth daily. 12/02/14   Lora Paula, MD  potassium chloride SA (K-DUR,KLOR-CON) 20 MEQ tablet Take 1 tablet (20 mEq total) by mouth daily. 12/25/14   Lora Paula, MD  Pramoxine-Menthol (GOLD BOND MAXIMUM RELIEF) 1-1 % CREA Apply 1 application topically 2 (two) times daily as needed (for would care).    Historical Provider, MD  predniSONE (DELTASONE) 20 MG tablet Take 2 tablets (40 mg total) by mouth daily with breakfast. 12/25/14   Lora Paula, MD  torsemide (DEMADEX) 20 MG tablet Take 2 tablets (40 mg  total) by mouth 2 (two) times daily. 04/12/15   Chrystie Nose, MD  warfarin (COUMADIN) 2.5 MG tablet Take 1-1.5 tablets by mouth daily as directed by coumadin clinic 04/24/15   Chrystie Nose, MD   BP 190/92 mmHg  Pulse 104  Temp(Src) 98.2 F (36.8 C) (Oral)  Resp 25  Ht 5\' 2"  (1.575 m)  Wt 64.411 kg  BMI 25.97 kg/m2  SpO2 92% Physical Exam  Constitutional: She appears well-developed and well-nourished. No distress.  Elderly Caucasian female in moderate respiratory discomfort  HENT:  Head: Atraumatic.  Mouth/Throat: Oropharynx is clear and moist.  Eyes: Conjunctivae are normal.  Neck: Neck supple. No JVD present.  Cardiovascular: Normal rate and regular rhythm.   Mild tachycardia  Pulmonary/Chest: She  has wheezes.  Inspiratory and expiratory wheezes with decreased breath sounds. Tachypnea with accessory muscle use.  Abdominal: Soft. There is no tenderness.  Musculoskeletal: She exhibits no edema.  Neurological: She is alert.  Skin: No rash noted.  Psychiatric: She has a normal mood and affect.  Nursing note and vitals reviewed.   ED Course  Procedures (including critical care time) Labs Review Labs Reviewed  BASIC METABOLIC PANEL - Abnormal; Notable for the following:    Chloride 100 (*)    Glucose, Bld 174 (*)    BUN 51 (*)    Creatinine, Ser 2.59 (*)    GFR calc non Af Amer 16 (*)    GFR calc Af Amer 19 (*)    All other components within normal limits  CBC - Abnormal; Notable for the following:    RBC 3.54 (*)    Hemoglobin 9.1 (*)    HCT 30.2 (*)    MCH 25.7 (*)    RDW 19.0 (*)    All other components within normal limits  PROTIME-INR - Abnormal; Notable for the following:    Prothrombin Time 28.2 (*)    INR 2.69 (*)    All other components within normal limits  BRAIN NATRIURETIC PEPTIDE - Abnormal; Notable for the following:    B Natriuretic Peptide 914.1 (*)    All other components within normal limits  I-STAT TROPOININ, ED    Imaging Review Dg  Chest Portable 1 View  06/11/2015  CLINICAL DATA:  Shortness of breath EXAM: PORTABLE CHEST 1 VIEW COMPARISON:  12/22/2014 chest radiograph. FINDINGS: Stable cardiomediastinal silhouette with mild cardiomegaly. No pneumothorax. Likely small right pleural effusion. No left pleural effusion. Diffuse fluffy and linear parahilar opacities throughout both lungs, worsened in the interval, probably moderate pulmonary edema. IMPRESSION: Mild cardiomegaly with diffuse fluffy and linear parahilar opacities throughout both lungs, favor moderate pulmonary edema due to moderate congestive heart failure. Small right pleural effusion. Electronically Signed   By: Delbert Phenix M.D.   On: 06/11/2015 17:57   I have personally reviewed and evaluated these images and lab results as part of my medical decision-making.   EKG Interpretation   Date/Time:  Sunday June 11 2015 17:11:44 EST Ventricular Rate:  85 PR Interval:  242 QRS Duration: 93 QT Interval:  382 QTC Calculation: 454 R Axis:   45 Text Interpretation:  Sinus rhythm Prolonged PR interval RAE, consider  biatrial enlargement Minimal ST depression, diffuse leads SINCE LAST  TRACING HEART RATE HAS INCREASED Confirmed by Ethelda Chick  MD, SAM [phone])  on 06/11/2015 9:16:19 PM     ED ECG REPORT   Date: 06/11/2015  Rate: 85  Rhythm: normal sinus rhythm  QRS Axis: normal  Intervals: PR prolonged  ST/T Wave abnormalities: nonspecific ST changes  Conduction Disutrbances:none  Narrative Interpretation:   Old EKG Reviewed: unchanged  I have personally reviewed the EKG tracing and agree with the computerized printout as noted.   MDM   Final diagnoses:  Acute on chronic respiratory failure with hypoxia (HCC)  Renal insufficiency  Paroxysmal atrial fibrillation (HCC)  Chronic pulmonary edema  Chronic diastolic congestive heart failure (HCC)    BP 104/58 mmHg  Pulse 95  Temp(Src) 98.2 F (36.8 C) (Oral)  Resp 26  Ht 5\' 2"  (1.575 m)  Wt 66.225  kg  BMI 26.70 kg/m2  SpO2 97%   5:50 PM Patient with history of COPD currently on home O2. With acute onset of shortness of breath consistence with acute on chronic respiratory  failure.  Hx of diastolic CHF. Unknown triggers. She is in moderate respiratory discomfort, actively wheezing and using accessory muscle. Patient has received Solu-Medrol and albuterol prior to arrival. [facility]: Lambert Rhonda W begin continuous nebs and will monitor closely.   9:06 PM Patient likely having acute on chronic respiratory failure with hypoxia. Elevated BNP at 914 and chest x-ray that shows pulmonary edema and small right pleural effusion.  Minimal improvement after receiving hour-long nebs. Decreased breath sounds with wheezing on reexamination. Magnesium sulfate given.  Care discussed with Dr. Ethelda Chick.  Will admit pt for further management of her COPD exacerbation.    9:28 PM Appreciate consultation from Dr. Robb Matar who agrees to see patient in the ED and will admitted to stepdown under his care. Holding orders placed. Nitropaste giving help with CHF exacerbation and her chest tightness.  Fayrene Helper, PA-C 06/11/15 2129  Doug Sou, MD 06/12/15 0000

## 2015-06-11 NOTE — H&P (Signed)
[facility] History and Physical  [name]:[id number] DOB: May 06, 1932 DOA: 06/11/2015  Referring physician: Fayrene Helper, PA-C PCP: Elizabeth Palau, FNP   Chief Complaint: Shortness of breath.  HPI: Alexis [name] is a 82 y.o. female with a past medical history of chronic respiratory failure, on home oxygen, COPD, diastolic dysfunction CHF, paroxysmal atrial fibrillation, hypertension, type 2 diabetes, diabetic neuropathy, anxiety who comes to the emergency department with complaints of worsening dyspnea, wheezing, cough and increased fatigue since earlier this afternoon which occurred while she was eating lunch.  Patient subsequently called EMS, still was having dyspnea after supplemental oxygen, albuterol nebulizer treatment and Solu-Medrol was given. She denies fever, chills, earaches, rhinorrhea, sore throat, productive cough, abdominal pain, nausea/emesis, diarrhea, melena, hematochezia, or GU symptoms. She denies chest pain, palpitations, dizziness, diaphoresis, recent edema, but complains of PND and orthopnea. She states that she has been sleeping on her recliner for the past 2 days.  When seen, in the stepdown unit, the patient stated that she felt a lot better. She was in no acute distress.  Review of Systems:  Constitutional:  Positive fatigue No weight loss, night sweats, Fevers, chills.  HEENT:  No headaches, Difficulty swallowing,Tooth/dental problems,Sore throat,  No sneezing, itching, ear ache, nasal congestion, post nasal drip,  Cardio-vascular: Positive Orthopnea, PND, occasional swelling in lower extremities, but none recently. No chest pain,  anasarca, dizziness, palpitations  GI:  No heartburn, indigestion, abdominal pain, nausea, vomiting, diarrhea, change in bowel habits, loss of appetite  Resp:  Positive dyspnea, nonproductive cough, wheezing. No hemoptysis.  Skin:  no rash or lesions.  GU:  no dysuria, change in color of urine, no  urgency or frequency. No flank pain.  Musculoskeletal:  Frequent oncologist and occasional  back pain.  Psych:  Complains of anxiety after bronchodilators and Solu-Medrol were given earlier. No change in mood or affect. History of depression on Lexapro. No memory loss.   Past Medical History  Diagnosis Date  . Hypertension   . Neuropathy (HCC)   . Anxiety   . PONV (postoperative nausea and vomiting)   . CHF (congestive heart failure) (HCC)   . COPD (chronic obstructive pulmonary disease) (HCC)     on home, nocturnal oxygen.   . Family history of adverse reaction to anesthesia   . Cardiomyopathy (HCC)   . Diabetes mellitus without complication (HCC)     TYPE 2  . Macular degeneration, bilateral   . Varicose veins   . GIB (gastrointestinal bleeding)   . Paroxysmal atrial fibrillation (HCC)   . Shortness of breath dyspnea   . Acute on chronic diastolic heart failure (HCC) 12/22/2014   Past Surgical History  Procedure Laterality Date  . Hip fracture surgery  2004  . Abdominal hysterectomy    . Vein surgery    . Esophagogastroduodenoscopy N/A 12/01/2014    Procedure: ESOPHAGOGASTRODUODENOSCOPY (EGD);  Surgeon: Hilarie Fredrickson, MD;  [facility] ENDOS[facility];  Service: Endoscopy;  Laterality: N/A;   Social History:  reports that she has been passively smoking.  She has never used smokeless tobacco. She reports that she does not drink alcohol or use illicit drugs.  Allergies  Allergen Reactions  . Yellow Dyes (Non-Tartrazine) Nausea And Vomiting    "deathly allergic" No other information given to explain reaction.  . Codeine Hives  . Other Other (See Comments)    novocaine broke mouth out  . Penicillins Hives  . Procaine Hives    Family History  Problem Relation Age of Onset  .  Colon cancer Father 32    advanced when discovered, no surgery or therapy.  this led to his death  . Breast cancer Sister   . Cancer Brother   . Breast cancer Mother   . Lung cancer Brother   . Thyroid  disease Neg Hx     Prior to Admission medications   Medication Sig Start Date End Date Taking? Authorizing Provider  albuterol (PROVENTIL HFA;VENTOLIN HFA) 108 (90 BASE) MCG/ACT inhaler Inhale 2 puffs into the lungs every 6 (six) hours as needed for wheezing or shortness of breath. 06/11/13  Yes Nishant Dhungel, MD  albuterol (PROVENTIL) (2.5 MG/3ML) 0.083% nebulizer solution Take 3 mLs (2.5 mg total) by nebulization every 6 (six) hours as needed for wheezing or shortness of breath. 12/24/14  Yes Lora Paula, MD  aspirin EC 81 MG tablet Take 81 mg by mouth every 6 (six) hours as needed for moderate pain.   Yes Historical Provider, MD  atorvastatin (LIPITOR) 20 MG tablet Take 20 mg by mouth at bedtime. 10/12/14 10/12/15 Yes Historical Provider, MD  cholecalciferol (VITAMIN D) 1000 UNITS tablet Take 1,000 Units by mouth every morning.    Yes Historical Provider, MD  escitalopram (LEXAPRO) 10 MG tablet Take 10 mg by mouth daily.   Yes Historical Provider, MD  gabapentin (NEURONTIN) 100 MG capsule Take 100 mg by mouth 2 (two) times daily. Take 1 capsule (100 mg) morning, take 2 capsules (200 mg) at bedtime   Yes Historical Provider, MD  glipiZIDE (GLUCOTROL XL) 5 MG 24 hr tablet Take 5 mg by mouth daily with breakfast.   Yes Historical Provider, MD  hydrALAZINE (APRESOLINE) 25 MG tablet TAKE 1 TABLET (25 MG TOTAL) BY MOUTH 2 (TWO) TIMES DAILY. 01/31/15  Yes Chrystie Nose, MD  isosorbide mononitrate (IMDUR) 30 MG 24 hr tablet TAKE 1 TABLET BY MOUTH EVERY DAY 01/31/15  Yes Chrystie Nose, MD  methimazole (TAPAZOLE) 5 MG tablet Take 1 tablet (5 mg total) by mouth 3 (three) times a week. Patient taking differently: Take 5 mg by mouth daily.  01/23/15  Yes Romero Belling, MD  metoprolol tartrate (LOPRESSOR) 25 MG tablet TAKE 1 TABLET BY MOUTH TWICE A DAY 02/27/15  Yes Chrystie Nose, MD  Multiple Vitamin (MULTIVITAMIN WITH MINERALS) TABS tablet Take 1 tablet by mouth daily.   Yes Historical Provider, MD    OXYGEN Inhale 3 L into the lungs at bedtime.    Yes Historical Provider, MD  polyethylene glycol (MIRALAX / GLYCOLAX) packet Take 17 g by mouth daily as needed for moderate constipation.   Yes Historical Provider, MD  potassium chloride SA (K-DUR,KLOR-CON) 20 MEQ tablet Take 1 tablet (20 mEq total) by mouth daily. 12/25/14  Yes Lora Paula, MD  torsemide (DEMADEX) 20 MG tablet Take 2 tablets (40 mg total) by mouth 2 (two) times daily. 04/12/15  Yes Chrystie Nose, MD  warfarin (COUMADIN) 2.5 MG tablet Take 1-1.5 tablets by mouth daily as directed by coumadin clinic Patient taking differently: Take 2.5-3.75 mg by mouth daily at 6 PM. Takes 1.5 tabs on mon only Takes 1 tab all other days 04/24/15  Yes Chrystie Nose, MD  fenofibrate 160 MG tablet Take 160 mg by mouth daily.    Historical Provider, MD  iron polysaccharides (NIFEREX) 150 MG capsule Take 1 capsule (150 mg total) by mouth 2 (two) times daily. 12/02/14   Lora Paula, MD  pantoprazole (PROTONIX) 40 MG tablet Take 1 tablet (40 mg total) by  mouth daily. 12/02/14   Lora Paula, MD  predniSONE (DELTASONE) 20 MG tablet Take 2 tablets (40 mg total) by mouth daily with breakfast. 12/25/14   Lora Paula, MD   Physical Exam: Filed Vitals:   06/11/15 2045 06/11/15 2115 06/11/15 2145 06/11/15 2200  BP: 108/57 104/58 98/48 108/56  Pulse: 141 95 122 109  Temp:    97.7 F (36.5 C)  TempSrc:    Oral  Resp: 27 26 18 17   Height:    5\' 2"  (1.575 m)  Weight:    64.9 kg (143 lb 1.3 oz)  SpO2: 95% 97% 97% 98%    Wt Readings from Last 3 Encounters:  06/11/15 64.9 kg (143 lb 1.3 oz)  01/23/15 65.772 kg (145 lb)  12/24/14 67.813 kg (149 lb 8 oz)    General:  Appears calm and comfortable Eyes: PERRL, normal lids, irises & conjunctiva ENT: grossly normal hearing, lips  and oral mucosa are mildly dry.  Neck: no LAD, masses or thyromegaly Cardiovascular:Irregularly irregular, no m/r/g.Trace  LE edema. Telemetry:Atrial  fibrillation.  Respiratory: Mild wheezing bilaterally, no accessory muscle use. Abdomen: soft, ntnd Skin: no rash or induration seen on limited exam Musculoskeletal: grossly normal tone BUE/BLE Psychiatric: grossly normal mood and affect, speech fluent and appropriate Neurologic:  awake, alert, oriented 3, grossly non-focal.          Labs on Admission:  Basic Metabolic Panel:  Recent Labs Lab 06/11/15 1755  NA 142  K 4.1  CL 100*  CO2 27  GLUCOSE 174*  BUN 51*  CREATININE 2.59*  CALCIUM 9.1   CBC:  Recent Labs Lab 06/11/15 1755  WBC 8.5  HGB 9.1*  HCT 30.2*  MCV 85.3  PLT 311    BNP (last 3 results)  Recent Labs  12/17/14 0250 12/21/14 0745 06/11/15 1755  BNP 759.7* 557.5* 914.1*     Radiological Exams on Admission: Dg Chest Portable 1 View  06/11/2015  CLINICAL DATA:  Shortness of breath EXAM: PORTABLE CHEST 1 VIEW COMPARISON:  12/22/2014 chest radiograph. FINDINGS: Stable cardiomediastinal silhouette with mild cardiomegaly. No pneumothorax. Likely small right pleural effusion. No left pleural effusion. Diffuse fluffy and linear parahilar opacities throughout both lungs, worsened in the interval, probably moderate pulmonary edema. IMPRESSION: Mild cardiomegaly with diffuse fluffy and linear parahilar opacities throughout both lungs, favor moderate pulmonary edema due to moderate congestive heart failure. Small right pleural effusion. Electronically Signed   By: Delbert Phenix M.D.   On: 06/11/2015 17:57  echocardiogram 12/01/2014 ------------------------------------------------------------------- LV EF: 55% -  60%  ------------------------------------------------------------------- Indications:   Dyspnea 786.09.  ------------------------------------------------------------------- History:  PMH:  Atrial fibrillation. Congestive heart failure. Chronic obstructive pulmonary disease. Risk factors: Hypertension. Diabetes  mellitus.  ------------------------------------------------------------------- Study Conclusions  - Left ventricle: The cavity size was normal. Systolic function was normal. The estimated ejection fraction was in the range of 55% to 60%. Wall motion was normal; there were no regional wall motion abnormalities. - Aortic valve: Valve area (VTI): 1.78 cm^2. Valve area (Vmax): 1.82 cm^2. Valve area (Vmean): 1.64 cm^2. - Mitral valve: There was mild regurgitation. Valve area by continuity equation (using LVOT flow): 1.79 cm^2. - Left atrium: The atrium was mildly dilated.  EKG: Independently reviewed. Vent. rate 85 BPM PR interval 242 ms QRS duration 93 ms QT/QTc 382/454 ms P-R-T axes 61 45 60 Sinus rhythm Prolonged PR interval RAE, consider biatrial enlargement Minimal ST depression, diffuse leads  Assessment/Plan Principal Problem:   Acute and chronic respiratory failure (acute-on-chronic) (HCC)  COPD exacerbation (HCC)   Acute on chronic diastolic CHF exacerbation. Admit to a stepdown for close monitoring. Continue supplemental oxygen. Continue bronchodilators. The patient received a single dose of Solu-Medrol 125 mg by EMS prior to arrival to the hospital. Furosemide 40 mg IVP every 12 hours instead of oral torsemide. Consider repeat echocardiogram. She remains a DO NOT RESUSCITATE DO NOT INTUBATE.  Active Problems:   Atrial fibrillation   CHA2DS2-VASc Score is 7.   Long-term (current) use of anticoagulants Continue metoprolol for rate control. Switch beta agonist bronchodilator to levalbuterol instead of albuterol if the patient becomes tachycardic. Continue anticoagulation with warfarin.    Hypertension Continue metoprolol and diuretic. Monitor blood pressure.    Type 2 diabetes mellitus, uncontrolled (HCC) Continue glipizide 5 mg by mouth daily. CBG monitoring with regular insulin sliding scale while in the hospital, the patient received  glucocorticoids earlier.      Renal insufficiency Monitor BUN/creatinine and electrolytes.      Hyperthyroidism Continue methimazole 5 mg by mouth daily. Check TSH.    Code Status: DNR/DNI. DVT Prophylaxis: The patient is on warfarin. Family Communication: Disposition Plan: Admit for acute on chronic respiratory failure treatment.   Time spent: Over 70 minutes were used during the process of this admission.    Bobette Mo [facility] Pager [phone].

## 2015-06-11 NOTE — ED Notes (Signed)
Pt reports sob worse on exertion. Ems- reports wheezing and decreased breaths sounds. 125 soul medrol IV given and 5mg  albuterol. Pt 82% on RA. Pt wears 3L [place] at home as needed. 92% on 4L [place].

## 2015-06-11 NOTE — ED Provider Notes (Signed)
Complains of shortness of breath onset 1 PM today typical of COPD she's had in the past. She also complains of heaviness in her chest since this afternoon. Treated with Solu-Medrol and albuterol in the field. Breathing is improved after receiving continuous nebulization here, however not at baseline. 6 reviewed by me in light of elevated BNP patient likely has elements of congestive heart failure as well as COPD, patient also with renal insufficiency, chronic, and anemia which could be contributing to renal insufficiency Results for orders placed or performed during the hospital encounter of 06/11/15  Basic metabolic panel  Result Value Ref Range   Sodium 142 135 - 145 mmol/L   Potassium 4.1 3.5 - 5.1 mmol/L   Chloride 100 (L) 101 - 111 mmol/L   CO2 27 22 - 32 mmol/L   Glucose, Bld 174 (H) 65 - 99 mg/dL   BUN 51 (H) 6 - 20 mg/dL   Creatinine, Ser 1.93 (H) 0.44 - 1.00 mg/dL   Calcium 9.1 8.9 - 79.0 mg/dL   GFR calc non Af Amer 16 (L) >60 mL/min   GFR calc Af Amer 19 (L) >60 mL/min   Anion gap 15 5 - 15  CBC  Result Value Ref Range   WBC 8.5 4.0 - 10.5 K/uL   RBC 3.54 (L) 3.87 - 5.11 MIL/uL   Hemoglobin 9.1 (L) 12.0 - 15.0 g/dL   HCT 24.0 (L) 97.3 - 53.2 %   MCV 85.3 78.0 - 100.0 fL   MCH 25.7 (L) 26.0 - 34.0 pg   MCHC 30.1 30.0 - 36.0 g/dL   RDW 99.2 (H) 42.6 - 83.4 %   Platelets 311 150 - 400 K/uL  Protime-INR  Result Value Ref Range   Prothrombin Time 28.2 (H) 11.6 - 15.2 seconds   INR 2.69 (H) 0.00 - 1.49  Brain natriuretic peptide  Result Value Ref Range   B Natriuretic Peptide 914.1 (H) 0.0 - 100.0 pg/mL  I-stat troponin, ED (not at [facility])  Result Value Ref Range   Troponin i, poc 0.02 0.00 - 0.08 ng/mL   Comment 3           Dg Chest Portable 1 View  06/11/2015  CLINICAL DATA:  Shortness of breath EXAM: PORTABLE CHEST 1 VIEW COMPARISON:  12/22/2014 chest radiograph. FINDINGS: Stable cardiomediastinal silhouette with mild cardiomegaly. No pneumothorax. Likely small  right pleural effusion. No left pleural effusion. Diffuse fluffy and linear parahilar opacities throughout both lungs, worsened in the interval, probably moderate pulmonary edema. IMPRESSION: Mild cardiomegaly with diffuse fluffy and linear parahilar opacities throughout both lungs, favor moderate pulmonary edema due to moderate congestive heart failure. Small right pleural effusion. Electronically Signed   By: Delbert Phenix M.D.   On: 06/11/2015 17:57     Doug Sou, MD 06/11/15 2120

## 2015-06-12 ENCOUNTER — Inpatient Hospital Stay (HOSPITAL_COMMUNITY): Payer: Medicare Other

## 2015-06-12 DIAGNOSIS — I10 Essential (primary) hypertension: Secondary | ICD-10-CM

## 2015-06-12 DIAGNOSIS — Z7901 Long term (current) use of anticoagulants: Secondary | ICD-10-CM

## 2015-06-12 DIAGNOSIS — J441 Chronic obstructive pulmonary disease with (acute) exacerbation: Secondary | ICD-10-CM

## 2015-06-12 DIAGNOSIS — N289 Disorder of kidney and ureter, unspecified: Secondary | ICD-10-CM

## 2015-06-12 DIAGNOSIS — I509 Heart failure, unspecified: Secondary | ICD-10-CM

## 2015-06-12 LAB — MRSA PCR SCREENING: MRSA BY PCR: NEGATIVE

## 2015-06-12 LAB — TSH: TSH: 1.132 u[IU]/mL (ref 0.350–4.500)

## 2015-06-12 LAB — GLUCOSE, CAPILLARY
GLUCOSE-CAPILLARY: 142 mg/dL — AB (ref 65–99)
Glucose-Capillary: 198 mg/dL — ABNORMAL HIGH (ref 65–99)
Glucose-Capillary: 213 mg/dL — ABNORMAL HIGH (ref 65–99)
Glucose-Capillary: 145 mg/dL — ABNORMAL HIGH (ref 65–99)

## 2015-06-12 LAB — CBC
HCT: 25 % — ABNORMAL LOW (ref 36.0–46.0)
Hemoglobin: 7.6 g/dL — ABNORMAL LOW (ref 12.0–15.0)
MCH: 25.8 pg — AB (ref 26.0–34.0)
MCHC: 30.4 g/dL (ref 30.0–36.0)
MCV: 84.7 fL (ref 78.0–100.0)
PLATELETS: 229 10*3/uL (ref 150–400)
RBC: 2.95 MIL/uL — AB (ref 3.87–5.11)
RDW: 19.2 % — ABNORMAL HIGH (ref 11.5–15.5)
WBC: 6 10*3/uL (ref 4.0–10.5)

## 2015-06-12 LAB — COMPREHENSIVE METABOLIC PANEL
ALK PHOS: 41 U/L (ref 38–126)
ALT: 15 U/L (ref 14–54)
AST: 20 U/L (ref 15–41)
Albumin: 2.8 g/dL — ABNORMAL LOW (ref 3.5–5.0)
Anion gap: 9 (ref 5–15)
BILIRUBIN TOTAL: 0.6 mg/dL (ref 0.3–1.2)
BUN: 56 mg/dL — ABNORMAL HIGH (ref 6–20)
CALCIUM: 8.6 mg/dL — AB (ref 8.9–10.3)
CO2: 28 mmol/L (ref 22–32)
CREATININE: 3.01 mg/dL — AB (ref 0.44–1.00)
Chloride: 103 mmol/L (ref 101–111)
GFR calc non Af Amer: 14 mL/min — ABNORMAL LOW (ref >60)
GFR, EST AFRICAN AMERICAN: 16 mL/min — AB (ref >60)
GLUCOSE: 241 mg/dL — AB (ref 65–99)
Potassium: 3.6 mmol/L (ref 3.5–5.1)
SODIUM: 140 mmol/L (ref 135–145)
TOTAL PROTEIN: 5.6 g/dL — AB (ref 6.5–8.1)

## 2015-06-12 LAB — PROTIME-INR
INR: 2.78 — AB (ref 0.00–1.49)
Prothrombin Time: 28.9 seconds — ABNORMAL HIGH (ref 11.6–15.2)

## 2015-06-12 LAB — TROPONIN I
Troponin I: 0.13 ng/mL — ABNORMAL HIGH (ref <0.031)
Troponin I: 0.35 ng/mL — ABNORMAL HIGH (ref <0.031)

## 2015-06-12 LAB — PHOSPHORUS: Phosphorus: 4.5 mg/dL (ref 2.5–4.6)

## 2015-06-12 MED ORDER — IPRATROPIUM BROMIDE 0.02 % IN SOLN
0.5000 mg | Freq: Three times a day (TID) | RESPIRATORY_TRACT | Status: DC
  Administered 2015-06-12 – 2015-06-15 (×9): 0.5 mg via RESPIRATORY_TRACT
  Filled 2015-06-12 (×10): qty 2.5

## 2015-06-12 MED ORDER — WARFARIN SODIUM 2.5 MG PO TABS
3.7500 mg | ORAL_TABLET | ORAL | Status: DC
  Filled 2015-06-12: qty 1

## 2015-06-12 MED ORDER — PREDNISONE 20 MG PO TABS: 40.0000 mg | ORAL_TABLET | Freq: Every day | ORAL | Status: DC

## 2015-06-12 MED ORDER — LEVALBUTEROL HCL 1.25 MG/0.5ML IN NEBU
1.2500 mg | INHALATION_SOLUTION | Freq: Four times a day (QID) | RESPIRATORY_TRACT | Status: DC | PRN
  Administered 2015-06-12: 1.25 mg via RESPIRATORY_TRACT
  Filled 2015-06-12: qty 0.5

## 2015-06-12 MED ORDER — LEVALBUTEROL HCL 0.63 MG/3ML IN NEBU: 0.6300 mg | INHALATION_SOLUTION | RESPIRATORY_TRACT | Status: DC | PRN

## 2015-06-12 MED ORDER — WARFARIN SODIUM 2.5 MG PO TABS
2.5000 mg | ORAL_TABLET | Freq: Once | ORAL | Status: DC
  Filled 2015-06-12 (×2): qty 1

## 2015-06-12 MED ORDER — WARFARIN - PHARMACIST DOSING INPATIENT: Freq: Every day | Status: DC

## 2015-06-12 MED ORDER — WARFARIN SODIUM 2.5 MG PO TABS
2.5000 mg | ORAL_TABLET | ORAL | Status: DC
  Administered 2015-06-12: 2.5 mg via ORAL
  Filled 2015-06-12 (×2): qty 1

## 2015-06-12 MED ORDER — WARFARIN SODIUM 1 MG PO TABS
2.5000 mg | ORAL_TABLET | Freq: Once | ORAL | Status: AC
  Administered 2015-06-12: 2.5 mg via ORAL

## 2015-06-12 MED ORDER — POLYSACCHARIDE IRON COMPLEX 150 MG PO CAPS
150.0000 mg | ORAL_CAPSULE | Freq: Two times a day (BID) | ORAL | Status: DC
  Administered 2015-06-12 – 2015-06-15 (×7): 150 mg via ORAL
  Filled 2015-06-12 (×8): qty 1

## 2015-06-12 MED ORDER — FUROSEMIDE 40 MG PO TABS
40.0000 mg | ORAL_TABLET | Freq: Two times a day (BID) | ORAL | Status: DC
  Administered 2015-06-12: 40 mg via ORAL
  Filled 2015-06-12: qty 1

## 2015-06-12 MED ORDER — PREDNISONE 20 MG PO TABS
20.0000 mg | ORAL_TABLET | Freq: Every day | ORAL | Status: DC
  Administered 2015-06-13 – 2015-06-15 (×3): 20 mg via ORAL
  Filled 2015-06-12 (×3): qty 1

## 2015-06-12 MED ORDER — WARFARIN SODIUM 7.5 MG PO TABS: 3.7500 mg | ORAL_TABLET | ORAL | Status: DC

## 2015-06-12 MED ORDER — LEVALBUTEROL HCL 0.63 MG/3ML IN NEBU
1.2500 mg | INHALATION_SOLUTION | Freq: Three times a day (TID) | RESPIRATORY_TRACT | Status: DC
  Administered 2015-06-12 – 2015-06-13 (×3): 1.25 mg via RESPIRATORY_TRACT
  Administered 2015-06-13 – 2015-06-14 (×3): 0.63 mg via RESPIRATORY_TRACT
  Administered 2015-06-15: 1.26 mg via RESPIRATORY_TRACT
  Filled 2015-06-12 (×11): qty 6

## 2015-06-12 NOTE — Progress Notes (Signed)
ANTICOAGULATION CONSULT NOTE - Follow Up Consult  Pharmacy Consult for Coumadin Indication: atrial fibrillation  Allergies  Allergen Reactions  . Yellow Dyes (Non-Tartrazine) Nausea And Vomiting    "deathly allergic" No other information given to explain reaction.  . Codeine Hives  . Other Other (See Comments)    novocaine broke mouth out  . Penicillins Hives  . Procaine Hives    Patient Measurements: Height: 5\' 2"  (157.5 cm) Weight: 143 lb 1.3 oz (64.9 kg) IBW/kg (Calculated) : 50.1  Vital Signs: Temp: 97.5 F (36.4 C) (01/16 0800) Temp Source: Oral (01/16 0800) BP: 123/61 mmHg (01/16 0800) Pulse Rate: 65 (01/16 0800)  Labs:  Recent Labs  06/11/15 1755 06/12/15 0100 06/12/15 0553  HGB 9.1*  --  7.6*  HCT 30.2*  --  25.0*  PLT 311  --  229  LABPROT 28.2*  --  28.9*  INR 2.69*  --  2.78*  CREATININE 2.59*  --  3.01*  TROPONINI  --  0.13* 0.35*    Estimated Creatinine Clearance: 13 mL/min (by C-G formula based on Cr of 3.01).  Assessment: 82yo female admitted for acute-on-chronic respiratory failure. On coumadin 2.5mg  daily exc 3.75mg  every Mon PTA  for h/o PAF. Last dose at home was taken on 1/14. INR on admit was theapeutic at 2.69. INR today remains therapeutic but up to 2.78.  Goal of Therapy:  INR 2-3 Monitor platelets by anticoagulation protocol: Yes   Plan:  Will give coumadin 2.5mg  PO x 1 tonight Monitor daily INR, CBC, s/s of bleed  Enzo Bi, PharmD, [facility] Clinical Pharmacist Pager [phone] 06/12/2015 9:14 AM

## 2015-06-12 NOTE — Progress Notes (Signed)
ANTICOAGULATION CONSULT NOTE - Initial Consult  Pharmacy Consult for Coumadin Indication: atrial fibrillation  Allergies  Allergen Reactions  . Yellow Dyes (Non-Tartrazine) Nausea And Vomiting    "deathly allergic" No other information given to explain reaction.  . Codeine Hives  . Other Other (See Comments)    novocaine broke mouth out  . Penicillins Hives  . Procaine Hives    Patient Measurements: Height: 5\' 2"  (157.5 cm) Weight: 143 lb 1.3 oz (64.9 kg) IBW/kg (Calculated) : 50.1  Vital Signs: Temp: 97.7 F (36.5 C) (01/16 0012) Temp Source: Oral (01/16 0012) BP: 104/50 mmHg (01/16 0012) Pulse Rate: 110 (01/16 0012)  Labs:  Recent Labs  06/11/15 1755  HGB 9.1*  HCT 30.2*  PLT 311  LABPROT 28.2*  INR 2.69*  CREATININE 2.59*    Estimated Creatinine Clearance: 15.1 mL/min (by C-G formula based on Cr of 2.59).   Medical History: Past Medical History  Diagnosis Date  . Hypertension   . Neuropathy (HCC)   . Anxiety   . PONV (postoperative nausea and vomiting)   . CHF (congestive heart failure) (HCC)   . COPD (chronic obstructive pulmonary disease) (HCC)     on home, nocturnal oxygen.   . Family history of adverse reaction to anesthesia   . Cardiomyopathy (HCC)   . Diabetes mellitus without complication (HCC)     TYPE 2  . Macular degeneration, bilateral   . Varicose veins   . GIB (gastrointestinal bleeding)   . Paroxysmal atrial fibrillation (HCC)   . Shortness of breath dyspnea   . Acute on chronic diastolic heart failure (HCC) 12/22/2014    Medications:  Prescriptions prior to admission  Medication Sig Dispense Refill Last Dose  . albuterol (PROVENTIL HFA;VENTOLIN HFA) 108 (90 BASE) MCG/ACT inhaler Inhale 2 puffs into the lungs every 6 (six) hours as needed for wheezing or shortness of breath. 1 Inhaler 3 06/11/2015 at Unknown time  . albuterol (PROVENTIL) (2.5 MG/3ML) 0.083% nebulizer solution Take 3 mLs (2.5 mg total) by nebulization every 6 (six)  hours as needed for wheezing or shortness of breath. 75 mL 0 06/11/2015 at Unknown time  . aspirin EC 81 MG tablet Take 81 mg by mouth every 6 (six) hours as needed for moderate pain.   06/10/2015 at Unknown time  . atorvastatin (LIPITOR) 20 MG tablet Take 20 mg by mouth at bedtime.   06/10/2015 at Unknown time  . cholecalciferol (VITAMIN D) 1000 UNITS tablet Take 1,000 Units by mouth every morning.    06/11/2015 at Unknown time  . escitalopram (LEXAPRO) 10 MG tablet Take 10 mg by mouth daily.   06/11/2015 at Unknown time  . gabapentin (NEURONTIN) 100 MG capsule Take 100 mg by mouth 2 (two) times daily. Take 1 capsule (100 mg) morning, take 2 capsules (200 mg) at bedtime   06/11/2015 at Unknown time  . glipiZIDE (GLUCOTROL XL) 5 MG 24 hr tablet Take 5 mg by mouth daily with breakfast.   06/11/2015 at Unknown time  . hydrALAZINE (APRESOLINE) 25 MG tablet TAKE 1 TABLET (25 MG TOTAL) BY MOUTH 2 (TWO) TIMES DAILY. 60 tablet 6 06/11/2015 at am  . isosorbide mononitrate (IMDUR) 30 MG 24 hr tablet TAKE 1 TABLET BY MOUTH EVERY DAY 30 tablet 6 06/11/2015 at Unknown time  . methimazole (TAPAZOLE) 5 MG tablet Take 1 tablet (5 mg total) by mouth 3 (three) times a week. (Patient taking differently: Take 5 mg by mouth daily. ) 30 tablet 2 06/11/2015 at Unknown time  .  metoprolol tartrate (LOPRESSOR) 25 MG tablet TAKE 1 TABLET BY MOUTH TWICE A DAY 60 tablet 9 06/11/2015 at 1000  . Multiple Vitamin (MULTIVITAMIN WITH MINERALS) TABS tablet Take 1 tablet by mouth daily.   06/11/2015 at Unknown time  . OXYGEN Inhale 3 L into the lungs at bedtime.    06/10/2015 at Unknown time  . polyethylene glycol (MIRALAX / GLYCOLAX) packet Take 17 g by mouth daily as needed for moderate constipation.   Past Month at Unknown time  . potassium chloride SA (K-DUR,KLOR-CON) 20 MEQ tablet Take 1 tablet (20 mEq total) by mouth daily. 60 tablet 2 06/11/2015 at Unknown time  . torsemide (DEMADEX) 20 MG tablet Take 2 tablets (40 mg total) by mouth 2 (two)  times daily. 120 tablet 8 06/11/2015 at am  . warfarin (COUMADIN) 2.5 MG tablet Take 1-1.5 tablets by mouth daily as directed by coumadin clinic (Patient taking differently: Take 2.5-3.75 mg by mouth daily at 6 PM. Takes 1.5 tabs on mon only Takes 1 tab all other days) 30 tablet 3 06/10/2015 at Unknown time  . fenofibrate 160 MG tablet Take 160 mg by mouth daily.   on hold  . iron polysaccharides (NIFEREX) 150 MG capsule Take 1 capsule (150 mg total) by mouth 2 (two) times daily. 60 capsule 0 Taking  . pantoprazole (PROTONIX) 40 MG tablet Take 1 tablet (40 mg total) by mouth daily. 30 tablet 0 Taking  . predniSONE (DELTASONE) 20 MG tablet Take 2 tablets (40 mg total) by mouth daily with breakfast. 2 tablet 0 Taking   Scheduled:  . atorvastatin  20 mg Oral QHS  . cholecalciferol  1,000 Units Oral q morning - 10a  . escitalopram  10 mg Oral Daily  . fenofibrate  160 mg Oral Daily  . furosemide  40 mg Intravenous Q12H  . gabapentin  100 mg Oral Daily  . gabapentin  200 mg Oral QHS  . glipiZIDE  5 mg Oral Q breakfast  . hydrALAZINE  25 mg Oral BID  . insulin aspart  0-15 Units Subcutaneous TID WC  . ipratropium  0.5 mg Nebulization Q6H  . isosorbide mononitrate  30 mg Oral Daily  . levalbuterol  1.25 mg Nebulization Q6H  . magnesium sulfate 1 - 4 g bolus IVPB  2 g Intravenous Once  . methimazole  5 mg Oral Daily  . metoprolol tartrate  25 mg Oral BID  . multivitamin with minerals  1 tablet Oral Daily  . pantoprazole  40 mg Oral Daily  . potassium chloride SA  20 mEq Oral Daily    Assessment: 82yo female admitted for acute-on-chronic respiratory failure to continue Coumadin during admission; current INR within goal w/ last dose taken 1/14.  Goal of Therapy:  INR 2-3   Plan:  Will continue home Coumadin regimen of 2.5mg  daily except 3.75mg  on Monday and monitor INR.  Vernard Gambles, PharmD, BCPS  06/12/2015,1:04 AM

## 2015-06-12 NOTE — Progress Notes (Signed)
06/12/2015 Rn from 2 central text Dr Sharon Seller at 1730 about trop1 at 0100 was 0.13 and it was 0.35 at 0553.[facility] RN.

## 2015-06-12 NOTE — Progress Notes (Signed)
  Echocardiogram 2D Echocardiogram has been performed.  Leta Jungling M 06/12/2015, 1:39 PM

## 2015-06-12 NOTE — Progress Notes (Signed)
[place] TEAM 1 - Stepdown/ICU TEAM PROGRESS NOTE  Alexis [name] ZOX:[id number] DOB: 1931-12-29 DOA: 06/11/2015 PCP: Elizabeth Palau, FNP  Admit HPI / Brief Narrative: 82 y.o. female with a history of chronic respiratory failure on home oxygen, COPD, diastolic dysfunction, paroxysmal atrial fibrillation, HTN, DM2 w/ neuropathy, and anxiety who came to the ED with complaints of worsening dyspnea, wheezing, cough and increased fatigue of acute onset.  Patient called EMS, and continued to have dyspnea after supplemental oxygen, albuterol nebulizer treatment and Solu-Medrol was given.   HPI/Subjective: The patient is sitting in a bedside chair.  She denies chest pain shortness breath nausea vomiting or abdominal pain.  She appears to be very mildly confused presently.  Assessment/Plan:  Acute on chronic respiratory failure - COPD exacerbation  Much improved at present - wean nebulizers - begin to ambulate  Chronic diastolic CHF exacerbation Dry weight approximately 68 kg July 2016 - currently 65 kg - no evidence of significant volume overload on physical exam  Atrial fibrillation CHA2DS2-VASc Score is 7 - continue anticoagulation with warfarin - rate presently controlled  Hypertension Blood pressures currently well-controlled  DM2 - uncontrolled Follow CBG trend - check A1c  Renal insufficiency Baseline creatinine appears to range 2.5-3.0 - creatinine presently at approximately baseline  Normocytic anemia Likely related to renal function - no evidence of blood loss - follow trend - check anemia panel in a.m.  Hyperthyroidism Continue methimazole  Code Status: DNR - NO CODE  Family Communication: no family present at time of exam Disposition Plan: Transfer to medical bed - ambulate - monitor oxygen saturation and wean as able - PT/OT  Consultants: none  Procedures: none  Antibiotics: None  DVT prophylaxis: Warfarin per pharmacy  Objective: Blood pressure  123/61, pulse 65, temperature 97.5 F (36.4 C), temperature source Oral, resp. rate 20, height  (1.575 m), weight 64.9 kg (143 lb 1.3 oz), SpO2 92 %. No intake or output data in the 24 hours ending 06/12/15 0815   Exam: General: No acute respiratory distress at rest in bedside chair Lungs: Distant breath sounds throughout - no focal crackles - no wheeze Cardiovascular: Regular rate without murmur gallop or rub normal S1 and S2 Abdomen: Nontender, nondistended, soft, bowel sounds positive, no rebound, no ascites, no appreciable mass Extremities: No significant cyanosis, clubbing, or edema bilateral lower extremities  Data Reviewed:  Basic Metabolic Panel:  Recent Labs Lab 06/11/15 1755 06/12/15 0100 06/12/15 0553  NA 142  --  140  K 4.1  --  3.6  CL 100*  --  103  CO2 27  --  28  GLUCOSE 174*  --  241*  BUN 51*  --  56*  CREATININE 2.59*  --  3.01*  CALCIUM 9.1  --  8.6*  PHOS  --  4.5  --     CBC:  Recent Labs Lab 06/11/15 1755 06/12/15 0553  WBC 8.5 6.0  HGB 9.1* 7.6*  HCT 30.2* 25.0*  MCV 85.3 84.7  PLT 311 229    Liver Function Tests:  Recent Labs Lab 06/12/15 0553  AST 20  ALT 15  ALKPHOS 41  BILITOT 0.6  PROT 5.6*  ALBUMIN 2.8*    Coags:  Recent Labs Lab 06/11/15 1755 06/12/15 0553  INR 2.69* 2.78*    Cardiac Enzymes:  Recent Labs Lab 06/12/15 0100 06/12/15 0553  TROPONINI 0.13* 0.35*    CBG: No results for input(s): GLUCAP in the last 168 hours.  Recent Results (from the past 240 hour(s))  MRSA PCR Screening     Status: None   Collection Time: 06/11/15 11:20 PM  Result Value Ref Range Status   MRSA by PCR NEGATIVE NEGATIVE Final    Comment:        The GeneXpert MRSA Assay (FDA approved for NASAL specimens only), is one component of a comprehensive MRSA colonization surveillance program. It is not intended to diagnose MRSA infection nor to guide or monitor treatment for MRSA infections.      Studies:   Recent  x-ray studies have been reviewed in detail by the Attending Physician  Scheduled Meds:  Scheduled Meds: . atorvastatin  20 mg Oral QHS  . cholecalciferol  1,000 Units Oral q morning - 10a  . escitalopram  10 mg Oral Daily  . fenofibrate  160 mg Oral Daily  . furosemide  40 mg Intravenous Q12H  . gabapentin  100 mg Oral Daily  . gabapentin  200 mg Oral QHS  . glipiZIDE  5 mg Oral Q breakfast  . hydrALAZINE  25 mg Oral BID  . insulin aspart  0-15 Units Subcutaneous TID WC  . ipratropium  0.5 mg Nebulization TID  . isosorbide mononitrate  30 mg Oral Daily  . levalbuterol  1.25 mg Nebulization TID  . methimazole  5 mg Oral Daily  . metoprolol tartrate  25 mg Oral BID  . multivitamin with minerals  1 tablet Oral Daily  . pantoprazole  40 mg Oral Daily  . potassium chloride SA  20 mEq Oral Daily  . warfarin  2.5 mg Oral Once per day on Sun Tue Wed Thu Fri Sat  . warfarin  3.75 mg Oral Q Mon-1800  . Warfarin - Pharmacist Dosing Inpatient   Does not apply q1800    Time spent on care of this patient: 35 mins   [facility] T , MD   [facility] Office  [phone] Pager - Text Page per Amion as per below:  On-Call/Text Page:      [email or web address]      password [email or web address]  If 7PM-7AM, please contact night-coverage [email or web address] Password [email or web address] 06/12/2015, 8:15 AM   LOS: 1 day

## 2015-06-12 NOTE — Progress Notes (Signed)
Pt has arrived to 2 west. Pt has been oriented to her room and is resting in her bed with the call light and phone within reach. Family is at bedside.   Berdine Dance RN, BSN

## 2015-06-12 NOTE — Progress Notes (Signed)
06/12/2015 bladder scan at 1350 patient had 163cc in bladder. Lovie Macadamia RN

## 2015-06-12 NOTE — Progress Notes (Signed)
Utilization review completed. Westlee Devita, RN, BSN. 

## 2015-06-12 NOTE — Clinical Documentation Improvement (Signed)
Internal Medicine  Can the diagnosis of CKD be further specified in progress notes and discharge summary?   CKD Stage I - GFR greater than or equal to 90  CKD Stage II - GFR 60-89  CKD Stage III - GFR 30-59  CKD Stage IV - GFR 15-29  CKD Stage V - GFR < 15  ESRD (End Stage Renal Disease)  Other condition  Unable to clinically determine   Supporting Information:   82 year old patient with history of Diabetes, hypertension, and CHF  Renal insufficiency Baseline creatinine appears to range 2.5-3.0 - creatinine presently at approximately baseline  Component     Latest Ref Rng 06/11/2015 06/12/2015           BUN     6 - 20 mg/dL 51 (H) 56 (H)  Creatinine     0.44 - 1.00 mg/dL 2.59 (H) 3.01 (H)                              EGFR (Non-African Amer.)     >60 mL/min 16 (L) 14 (L)       Treatment Renal insufficiency Monitor BUN/creatinine and electrolytes. Daily weights Strict I&O  Please exercise your independent, professional judgment when responding. A specific answer is not anticipated or expected.   Thank You, [place] [phone]

## 2015-06-13 ENCOUNTER — Inpatient Hospital Stay (HOSPITAL_COMMUNITY): Payer: Medicare Other

## 2015-06-13 DIAGNOSIS — E1159 Type 2 diabetes mellitus with other circulatory complications: Secondary | ICD-10-CM

## 2015-06-13 DIAGNOSIS — E1165 Type 2 diabetes mellitus with hyperglycemia: Secondary | ICD-10-CM

## 2015-06-13 LAB — BASIC METABOLIC PANEL
ANION GAP: 10 (ref 5–15)
BUN: 73 mg/dL — ABNORMAL HIGH (ref 6–20)
CALCIUM: 8.6 mg/dL — AB (ref 8.9–10.3)
CO2: 28 mmol/L (ref 22–32)
CREATININE: 3.71 mg/dL — AB (ref 0.44–1.00)
Chloride: 95 mmol/L — ABNORMAL LOW (ref 101–111)
GFR, EST AFRICAN AMERICAN: 12 mL/min — AB (ref >60)
GFR, EST NON AFRICAN AMERICAN: 11 mL/min — AB (ref >60)
Glucose, Bld: 130 mg/dL — ABNORMAL HIGH (ref 65–99)
Potassium: 4.7 mmol/L (ref 3.5–5.1)
SODIUM: 133 mmol/L — AB (ref 135–145)

## 2015-06-13 LAB — IRON AND TIBC
IRON: 39 ug/dL (ref 28–170)
SATURATION RATIOS: 8 % — AB (ref 10.4–31.8)
TIBC: 459 ug/dL — ABNORMAL HIGH (ref 250–450)
UIBC: 420 ug/dL

## 2015-06-13 LAB — CBC
HEMATOCRIT: 25.4 % — AB (ref 36.0–46.0)
HEMOGLOBIN: 7.7 g/dL — AB (ref 12.0–15.0)
MCH: 25.8 pg — AB (ref 26.0–34.0)
MCHC: 30.3 g/dL (ref 30.0–36.0)
MCV: 85.2 fL (ref 78.0–100.0)
Platelets: 261 10*3/uL (ref 150–400)
RBC: 2.98 MIL/uL — AB (ref 3.87–5.11)
RDW: 19.4 % — ABNORMAL HIGH (ref 11.5–15.5)
WBC: 7.7 10*3/uL (ref 4.0–10.5)

## 2015-06-13 LAB — RETICULOCYTES
RBC.: 2.98 MIL/uL — ABNORMAL LOW (ref 3.87–5.11)
Retic Count, Absolute: 77.5 10*3/uL (ref 19.0–186.0)
Retic Ct Pct: 2.6 % (ref 0.4–3.1)

## 2015-06-13 LAB — GLUCOSE, CAPILLARY
GLUCOSE-CAPILLARY: 80 mg/dL (ref 65–99)
GLUCOSE-CAPILLARY: 159 mg/dL — AB (ref 65–99)
Glucose-Capillary: 164 mg/dL — ABNORMAL HIGH (ref 65–99)
Glucose-Capillary: 198 mg/dL — ABNORMAL HIGH (ref 65–99)

## 2015-06-13 LAB — PROTIME-INR
INR: 3.37 — AB (ref 0.00–1.49)
PROTHROMBIN TIME: 33.4 s — AB (ref 11.6–15.2)

## 2015-06-13 LAB — TROPONIN I
TROPONIN I: 0.81 ng/mL — AB (ref <0.031)
TROPONIN I: 0.41 ng/mL — AB (ref <0.031)

## 2015-06-13 LAB — FOLATE: FOLATE: 18 ng/mL (ref >5.9)

## 2015-06-13 LAB — FERRITIN: Ferritin: 92 ng/mL (ref 11–307)

## 2015-06-13 LAB — VITAMIN B12: VITAMIN B 12: 583 pg/mL (ref 180–914)

## 2015-06-13 MED ORDER — FUROSEMIDE 10 MG/ML IJ SOLN
20.0000 mg | Freq: Four times a day (QID) | INTRAMUSCULAR | Status: AC
  Administered 2015-06-13 – 2015-06-14 (×2): 20 mg via INTRAVENOUS
  Filled 2015-06-13: qty 2

## 2015-06-13 MED ORDER — LEVALBUTEROL HCL 1.25 MG/0.5ML IN NEBU
INHALATION_SOLUTION | RESPIRATORY_TRACT | Status: AC
  Administered 2015-06-13: 1.25 mg
  Filled 2015-06-13: qty 0.5

## 2015-06-13 MED ORDER — SODIUM CHLORIDE 0.9 % IV BOLUS (SEPSIS)
500.0000 mL | Freq: Once | INTRAVENOUS | Status: AC
  Administered 2015-06-13: 500 mL via INTRAVENOUS

## 2015-06-13 MED ORDER — WARFARIN SODIUM 1 MG PO TABS
1.0000 mg | ORAL_TABLET | Freq: Once | ORAL | Status: AC
  Administered 2015-06-13: 1 mg via ORAL
  Filled 2015-06-13: qty 1

## 2015-06-13 MED ORDER — FUROSEMIDE 10 MG/ML IJ SOLN
INTRAMUSCULAR | Status: AC
  Administered 2015-06-13: 20 mg
  Filled 2015-06-13: qty 2

## 2015-06-13 NOTE — Progress Notes (Signed)
While patient is sleeping H.R. In the 40's

## 2015-06-13 NOTE — Progress Notes (Signed)
Shift event: LPN, Melanie, paged this NP because pt was feeling more dyspneic, especially on exertion. O2 sats were normal and respiratory effort not labored. Pt has respiratory failure and hx CHF. Lasix has been held for a day due to rising creatinine and no overload on exam. CXR r/p shows findings consistent with mild CHF. Will give low dose Lasix, 20mg  IV q 6 h x 2 and monitor for improvement. Labs in am.  Hortencia Conradi, NP [facility]

## 2015-06-13 NOTE — Progress Notes (Signed)
Spoke with K.Korby R./N. And she is aware of all abnl labs and dec. Urine output. See orders. R.N. Also aware.

## 2015-06-13 NOTE — Progress Notes (Signed)
Trop. 0.81 lab call with critical value. R.N. Aware and page Donnamarie Poag N.P. Awaiting return call.

## 2015-06-13 NOTE — Progress Notes (Addendum)
PROGRESS NOTE  Alexis [name] [id number]:[id number] DOB: Feb 05, 1932 DOA: 06/11/2015 PCP: Elizabeth Palau, FNP  Admit HPI / Brief Narrative: 82 y.o. female with a history of chronic respiratory failure on home oxygen, COPD, diastolic dysfunction, paroxysmal atrial fibrillation, HTN, DM2 w/ neuropathy, and anxiety who came to the ED with complaints of worsening dyspnea, wheezing, cough and increased fatigue of acute onset.  Patient called EMS, and continued to have dyspnea after supplemental oxygen, albuterol nebulizer treatment and Solu-Medrol was given. Troponins slightly elevated, suspect demand ischemia-- never had chest pain. Echo does not show wall motion abnormalities.   HPI/Subjective: No chest pain, breathing fine  Assessment/Plan: Acute on chronic respiratory failure - COPD exacerbation  Much improved at present - wean nebulizers - begin to ambulate in AM with PT  Chronic diastolic CHF exacerbation Dry weight approximately 68 kg July 2016 - currently 65 kg - no evidence of significant volume overload on physical exam  Elevated troponin -suspect from demand ischemia + renal failure  Atrial fibrillation CHA2DS2-VASc Score is 7 - continue anticoagulation with warfarin - rate presently controlled  Hypertension Blood pressures currently well-controlled  DM2 - uncontrolled Follow CBG trend - check A1c  CKD stage V Baseline creatinine appears to range 2.5-3.0 -Cr higher today- lasix stopped this AM -check in AM  Normocytic anemia Likely related to renal function - no evidence of blood loss - follow trend  If troponins continue to increase, may need transfusion of blood  Hyperthyroidism Continue methimazole  Code Status: DNR - NO CODE  Family Communication: no family present at time of exam Disposition Plan:  ambulate - monitor oxygen saturation and wean as able - PT/OT  Consultants: none  Procedures: none  Antibiotics: None  DVT prophylaxis: Warfarin per  pharmacy  Objective: Blood pressure 148/55, pulse 59, temperature 97.7 F (36.5 C), temperature source Oral, resp. rate 20, height 5\' 2"  (1.575 m), weight 67.268 kg (148 lb 4.8 oz), SpO2 97 %.  Intake/Output Summary (Last 24 hours) at 06/13/15 1455 Last data filed at 06/13/15 1249  Gross per 24 hour  Intake    720 ml  Output    301 ml  Net    419 ml     Exam: General: No acute respiratory distress at rest in bedside chair Lungs: Distant breath sounds throughout - no focal crackles - no wheeze Cardiovascular: Regular rate without murmur gallop or rub normal S1 and S2 Abdomen: Nontender, nondistended, soft, bowel sounds positive, no rebound, no ascites, no appreciable mass Extremities: No significant cyanosis, clubbing, or edema bilateral lower extremities  Data Reviewed:  Basic Metabolic Panel:  Recent Labs Lab 06/11/15 1755 06/12/15 0100 06/12/15 0553 06/13/15 0217  NA 142  --  140 133*  K 4.1  --  3.6 4.7  CL 100*  --  103 95*  CO2 27  --  28 28  GLUCOSE 174*  --  241* 130*  BUN 51*  --  56* 73*  CREATININE 2.59*  --  3.01* 3.71*  CALCIUM 9.1  --  8.6* 8.6*  PHOS  --  4.5  --   --     CBC:  Recent Labs Lab 06/11/15 1755 06/12/15 0553 06/13/15 0217  WBC 8.5 6.0 7.7  HGB 9.1* 7.6* 7.7*  HCT 30.2* 25.0* 25.4*  MCV 85.3 84.7 85.2  PLT 311 229 261    Liver Function Tests:  Recent Labs Lab 06/12/15 0553  AST 20  ALT 15  ALKPHOS 41  BILITOT 0.6  PROT 5.6*  ALBUMIN 2.8*    Coags:  Recent Labs Lab 06/11/15 1755 06/12/15 0553 06/13/15 0217  INR 2.69* 2.78* 3.37*    Cardiac Enzymes:  Recent Labs Lab 06/12/15 0100 06/12/15 0553 06/13/15 0217  TROPONINI 0.13* 0.35* 0.81*    CBG:  Recent Labs Lab 06/12/15 1214 06/12/15 1706 06/12/15 2045 06/13/15 0631 06/13/15 1109  GLUCAP 213* 145* 142* 80 159*    Recent Results (from the past 240 hour(s))  MRSA PCR Screening     Status: None   Collection Time: 06/11/15 11:20 PM  Result  Value Ref Range Status   MRSA by PCR NEGATIVE NEGATIVE Final    Comment:        The GeneXpert MRSA Assay (FDA approved for NASAL specimens only), is one component of a comprehensive MRSA colonization surveillance program. It is not intended to diagnose MRSA infection nor to guide or monitor treatment for MRSA infections.        Scheduled Meds:  Scheduled Meds: . atorvastatin  20 mg Oral QHS  . cholecalciferol  1,000 Units Oral q morning - 10a  . escitalopram  10 mg Oral Daily  . fenofibrate  160 mg Oral Daily  . gabapentin  100 mg Oral Daily  . gabapentin  200 mg Oral QHS  . glipiZIDE  5 mg Oral Q breakfast  . hydrALAZINE  25 mg Oral BID  . insulin aspart  0-15 Units Subcutaneous TID WC  . ipratropium  0.5 mg Nebulization TID  . iron polysaccharides  150 mg Oral BID  . isosorbide mononitrate  30 mg Oral Daily  . levalbuterol  1.25 mg Nebulization TID  . methimazole  5 mg Oral Daily  . metoprolol tartrate  25 mg Oral BID  . multivitamin with minerals  1 tablet Oral Daily  . pantoprazole  40 mg Oral Daily  . potassium chloride SA  20 mEq Oral Daily  . predniSONE  20 mg Oral Q breakfast  . warfarin  1 mg Oral ONCE-1800  . Warfarin - Pharmacist Dosing Inpatient   Does not apply q1800    Time spent on care of this patient: 25 mins   Mia Winthrop Juanetta Gosling , DO   [facility] Office  [phone] Pager - Text Page per Loretha Stapler as per below:  On-Call/Text Page:      [email or web address]      password [email or web address]  If 7PM-7AM, please contact night-coverage [email or web address] Password [email or web address] 06/13/2015, 2:55 PM   LOS: 2 days

## 2015-06-13 NOTE — Discharge Instructions (Signed)

## 2015-06-13 NOTE — Progress Notes (Signed)
ANTICOAGULATION CONSULT NOTE - Follow Up Consult  Pharmacy Consult for Coumadin Indication: atrial fibrillation  Allergies  Allergen Reactions  . Yellow Dyes (Non-Tartrazine) Nausea And Vomiting    "deathly allergic" No other information given to explain reaction.  . Codeine Hives  . Other Other (See Comments)    novocaine broke mouth out  . Penicillins Hives  . Procaine Hives    Patient Measurements: Height: 5\' 2"  (157.5 cm) Weight: 148 lb 4.8 oz (67.268 kg) IBW/kg (Calculated) : 50.1  Vital Signs: Temp: 97.5 F (36.4 C) (01/17 0406) Temp Source: Oral (01/17 0406) BP: 121/46 mmHg (01/17 0951) Pulse Rate: 50 (01/17 0951)  Labs:  Recent Labs  06/11/15 1755 06/12/15 0100 06/12/15 0553 06/13/15 0217  HGB 9.1*  --  7.6* 7.7*  HCT 30.2*  --  25.0* 25.4*  PLT 311  --  229 261  LABPROT 28.2*  --  28.9* 33.4*  INR 2.69*  --  2.78* 3.37*  CREATININE 2.59*  --  3.01* 3.71*  TROPONINI  --  0.13* 0.35* 0.81*    Estimated Creatinine Clearance: 10.7 mL/min (by C-G formula based on Cr of 3.71).  Assessment: 82yo female admitted for acute-on-chronic respiratory failure. On coumadin 2.5mg  daily exc 3.75mg  every Mon PTA  for h/o PAF. Last dose at home was taken on 1/14. INR on admit was theapeutic at 2.69. Given coumadin 2.5 mg 1/16 at 0215 and 2024 for total of 5 mg.  INR today slightly above goal at 3.37. Hg 7.7, pltc WNL, No bleeding noted. Pt allergic to yellow dye (2.5 mg and 5 mg tablets) Ok to use 1 mg Coumadin tablets with red dye.  Goal of Therapy:  INR 2-3 Monitor platelets by anticoagulation protocol: Yes   Plan:  Dc home dose Coumadin 1 mg po x 1 dose today Monitor daily INR, CBC, s/s of bleeding  Herby Abraham, Pharm.D. [phone] 06/13/2015 11:23 AM

## 2015-06-13 NOTE — Progress Notes (Signed)
OT Cancellation Note  Patient Details Name: Alexis [name] MRN: [id number] DOB: Oct 21, 1931   Cancelled Treatment:    Reason Eval/Treat Not Completed: Medical issues which prohibited therapy. Dr. Benjamine Mola requesting hold for today, begin therapy tomorrow.  Evern Bio 06/13/2015, 3:18 PM  [phone]

## 2015-06-13 NOTE — Progress Notes (Signed)
PT Cancellation Note  Patient Details Name: Alexis [name] MRN: [id number] DOB: 05-17-1932   Cancelled Treatment:    Reason Eval/Treat Not Completed: Medical issues which prohibited therapy.  Per Dr. Benjamine Mola, hold mobility today due to elevated troponin until Cardiac can evaluate.  Will see 1/18 as able. 06/13/2015   Bing, PT [phone]  (pager)   [place] Cost, Eliseo Gum 06/13/2015, 5:30 PM

## 2015-06-14 LAB — GLUCOSE, CAPILLARY
GLUCOSE-CAPILLARY: 108 mg/dL — AB (ref 65–99)
GLUCOSE-CAPILLARY: 167 mg/dL — AB (ref 65–99)
Glucose-Capillary: 243 mg/dL — ABNORMAL HIGH (ref 65–99)
Glucose-Capillary: 190 mg/dL — ABNORMAL HIGH (ref 65–99)

## 2015-06-14 LAB — BASIC METABOLIC PANEL
ANION GAP: 11 (ref 5–15)
BUN: 79 mg/dL — ABNORMAL HIGH (ref 6–20)
CALCIUM: 8.8 mg/dL — AB (ref 8.9–10.3)
CO2: 27 mmol/L (ref 22–32)
Chloride: 98 mmol/L — ABNORMAL LOW (ref 101–111)
Creatinine, Ser: 3.62 mg/dL — ABNORMAL HIGH (ref 0.44–1.00)
GFR calc Af Amer: 13 mL/min — ABNORMAL LOW (ref >60)
GFR calc non Af Amer: 11 mL/min — ABNORMAL LOW (ref >60)
GLUCOSE: 141 mg/dL — AB (ref 65–99)
POTASSIUM: 4.3 mmol/L (ref 3.5–5.1)
Sodium: 136 mmol/L (ref 135–145)

## 2015-06-14 LAB — CBC
HEMATOCRIT: 26.3 % — AB (ref 36.0–46.0)
Hemoglobin: 8.1 g/dL — ABNORMAL LOW (ref 12.0–15.0)
MCH: 26.2 pg (ref 26.0–34.0)
MCHC: 30.8 g/dL (ref 30.0–36.0)
MCV: 85.1 fL (ref 78.0–100.0)
Platelets: 247 10*3/uL (ref 150–400)
RBC: 3.09 MIL/uL — AB (ref 3.87–5.11)
RDW: 19 % — ABNORMAL HIGH (ref 11.5–15.5)
WBC: 8.5 10*3/uL (ref 4.0–10.5)

## 2015-06-14 LAB — PROTIME-INR
INR: 3.99 — ABNORMAL HIGH (ref 0.00–1.49)
Prothrombin Time: 37.9 seconds — ABNORMAL HIGH (ref 11.6–15.2)

## 2015-06-14 LAB — TYPE AND SCREEN
ABO/RH(D): O POS
Antibody Screen: NEGATIVE

## 2015-06-14 LAB — HEMOGLOBIN A1C
Hgb A1c MFr Bld: 6.3 % — ABNORMAL HIGH (ref 4.8–5.6)
Mean Plasma Glucose: 134 mg/dL

## 2015-06-14 MED ORDER — TORSEMIDE 20 MG PO TABS
20.0000 mg | ORAL_TABLET | Freq: Two times a day (BID) | ORAL | Status: DC
Start: 2015-06-14 — End: 2015-06-15
  Administered 2015-06-14 – 2015-06-15 (×2): 20 mg via ORAL
  Filled 2015-06-14 (×2): qty 1

## 2015-06-14 NOTE — Progress Notes (Signed)
K.Kirby R.N. Text page results of CXR

## 2015-06-14 NOTE — Progress Notes (Signed)
K.Kirby N.P. Text page and aware of inc. D.O.E. resp 24 and Patient c/o S.O.B. R.N. Also aware. See orders for [facility]

## 2015-06-14 NOTE — Evaluation (Addendum)
Physical Therapy Evaluation Patient Details Name: Alexis [name] MRN: [id number] DOB: 1931-10-22 Today's Date: 06/14/2015   History of Present Illness  Pt admitted with acute on chronic respiratory failure and elevated troponin. PMH: COPD with 02 dependence, afib, HTN, DM with neuropathy, anxiety, R hip fx 12 years ago.  Clinical Impression  Pt admitted with/for respiratory failure which has improved during stay, but pt presents as deconditioned.  Pt currently limited functionally due to the problems listed below.  (see problems list.)  Pt will benefit from PT to maximize function and safety to be able to get home safely with available assist of familty.     Follow Up Recommendations Home health PT    Equipment Recommendations  None recommended by PT    Recommendations for Other Services       Precautions / Restrictions Precautions Precautions: Fall Restrictions Weight Bearing Restrictions: No      Mobility  Bed Mobility               General bed mobility comments: pt up in the recliner  Transfers Overall transfer level: Needs assistance Equipment used: 4-wheeled walker   Sit to Stand: Supervision         General transfer comment: used rollator brakes appropriately  Ambulation/Gait Ambulation/Gait assistance: Supervision Ambulation Distance (Feet): 400 Feet Assistive device: 4-wheeled walker Gait Pattern/deviations: Step-through pattern   Gait velocity interpretation: Below normal speed for age/gender General Gait Details: safe and steady use of the rollator  Stairs            Wheelchair Mobility    Modified Rankin (Stroke Patients Only)       Balance     Sitting balance-Leahy Scale: Good       Standing balance-Leahy Scale: Fair                               Pertinent Vitals/Pain      Home Living Family/patient expects to be discharged to:: Private residence Living Arrangements: Children (Son) Available Help at  Discharge: Family;Available 24 hours/day Type of Home: House Home Access: Stairs to enter Entrance Stairs-Rails: None Entrance Stairs-Number of Steps: 1 Home Layout: One level Home Equipment: Shower seat;Bedside commode;Walker - 4 wheels;Other (comment) (02--3L) Additional Comments: Pt sleeps in recliner    Prior Function Level of Independence: Needs assistance   Gait / Transfers Assistance Needed: ambulates with rollator  ADL's / Homemaking Assistance Needed: son helps with LB dressing and does housekeeping and meal prep  Comments: hx of falls     Hand Dominance   Dominant Hand: Right    Extremity/Trunk Assessment   Upper Extremity Assessment: Defer to OT evaluation           Lower Extremity Assessment: Overall WFL for tasks assessed (mild proximal weakness)         Communication   Communication: HOH  Cognition Arousal/Alertness: Awake/alert Behavior During Therapy: WFL for tasks assessed/performed Overall Cognitive Status: Within Functional Limits for tasks assessed                      General Comments General comments (skin integrity, edema, etc.): sats 95% on 2L and EHR in the 80's    Exercises        Assessment/Plan    PT Assessment    PT Diagnosis Generalized weakness;Other (comment) (decr activity tolerance)   PT Problem List    PT Treatment Interventions  PT Goals (Current goals can be found in the Care Plan section) Acute Rehab PT Goals Patient Stated Goal: return home PT Goal Formulation: With patient Time For Goal Achievement: 06/21/15 Potential to Achieve Goals: Good    Frequency     Barriers to discharge        Co-evaluation               End of Session   Activity Tolerance: Patient tolerated treatment well Patient left: in chair;with call bell/phone within reach Nurse Communication: Mobility status         Time: 6010-9323 PT Time Calculation (min) (ACUTE ONLY): 24 min   Charges:   PT Evaluation $PT  Eval Moderate Complexity: 1 Procedure PT Treatments $Gait Training: 8-22 mins   PT G Codes:        Giulliana Mcroberts, Eliseo Gum 06/14/2015, 1:57 PM 06/14/2015  [place] Bing, PT [phone]  (pager)

## 2015-06-14 NOTE — Care Management Important Message (Signed)
Important Message  Patient Details  Name: Alexis [name] MRN: [id number] Date of Birth: 28-Feb-1932   Medicare Important Message Given:  Yes    Kyla Balzarine 06/14/2015, 10:54 AM

## 2015-06-14 NOTE — Evaluation (Signed)
Occupational Therapy Evaluation Patient Details Name: Alexis [name] MRN: [id number] DOB: 1932/02/22 Today's Date: 06/14/2015    History of Present Illness Pt admitted with acute on chronic respiratory failure and elevated troponin. PMH: COPD with 02 dependence, afib, HTN, DM with neuropathy, anxiety, R hip fx 12 years ago.   Clinical Impression   Pt was assisted for LB dressing and IADL prior to admission.  She ambulated with a rollator.  Pt requiring supervision for toileting and standing grooming this visit.  Pt with poor safety awareness with attempts to pick items up from the floor despite fall risk. Pt could benefit from a sock aid. Pt lives with a supportive son. Will follow acutely. Do not anticipate need for post acute OT.    Follow Up Recommendations  No OT follow up    Equipment Recommendations  None recommended by OT    Recommendations for Other Services       Precautions / Restrictions Precautions Precautions: Fall Restrictions Weight Bearing Restrictions: No      Mobility Bed Mobility               General bed mobility comments: pt in recliner, typically sleeps in recliner  Transfers Overall transfer level: Needs assistance Equipment used: Rolling walker (2 wheeled) Transfers: Sit to/from Stand Sit to Stand: Supervision              Balance Overall balance assessment: Needs assistance Sitting-balance support: Feet supported Sitting balance-Leahy Scale: Good       Standing balance-Leahy Scale: Fair Standing balance comment: pt picks items up off the floor despite knowing she is unsteady                            ADL Overall ADL's : Needs assistance/impaired Eating/Feeding: Independent;Sitting   Grooming: Wash/dry hands;Wash/dry face;Supervision/safety;Standing   Upper Body Bathing: Set up;Sitting   Lower Body Bathing: Minimal assistance;Sit to/from stand   Upper Body Dressing : Set up;Sitting   Lower Body  Dressing: Minimal assistance;Sit to/from stand Lower Body Dressing Details (indicate cue type and reason): can doff socks, min assist to donn L, mod assist to [employer]  Transfer: Supervision/safety;Ambulation;RW;BSC   Toileting- Architect and Hygiene: Supervision/safety;Sit to/from stand       Functional mobility during ADLs: Supervision/safety;Rolling walker       Vision Additional Comments: glasses are at home   Perception     Praxis      Pertinent Vitals/Pain Pain Assessment: No/denies pain     Hand Dominance Right   Extremity/Trunk Assessment Upper Extremity Assessment Upper Extremity Assessment: Overall WFL for tasks assessed (arthritic changes in hands)   Lower Extremity Assessment Lower Extremity Assessment: Defer to PT evaluation       Communication Communication Communication: HOH   Cognition Arousal/Alertness: Awake/alert Behavior During Therapy: WFL for tasks assessed/performed Overall Cognitive Status: Within Functional Limits for tasks assessed                     General Comments       Exercises       Shoulder Instructions      Home Living Family/patient expects to be discharged to:: Private residence Living Arrangements: Children (Son) Available Help at Discharge: Family;Available 24 hours/day Type of Home: House Home Access: Stairs to enter [employer] of Steps: 1 Entrance Stairs-Rails: None Home Layout: One level     Bathroom Shower/Tub: Walk-in shower;Door   [employer] Toilet: Handicapped height Bathroom  Accessibility: Yes How Accessible: Accessible via walker Home Equipment: Shower seat;Bedside commode;Walker - 4 wheels;Other (comment) (02--3L)   Additional Comments: Pt sleeps in recliner      Prior Functioning/Environment Level of Independence: Needs assistance  Gait / Transfers Assistance Needed: ambulates with rollator ADL's / Homemaking Assistance Needed: son helps with LB dressing and  does housekeeping and meal prep   Comments: hx of falls    OT Diagnosis: Generalized weakness   OT Problem List: Decreased activity tolerance;Impaired balance (sitting and/or standing);Decreased knowledge of use of DME or AE;Cardiopulmonary status limiting activity   OT Treatment/Interventions: Self-care/ADL training;DME and/or AE instruction;Patient/family education    OT Goals(Current goals can be found in the care plan section) Acute Rehab OT Goals Patient Stated Goal: return home OT Goal Formulation: With patient Time For Goal Achievement: 06/21/15 Potential to Achieve Goals: Good ADL Goals Pt Will Perform Grooming: with modified independence;standing Pt Will Perform Lower Body Bathing: with modified independence;sit to/from stand Pt Will Perform Lower Body Dressing: with modified independence;with adaptive equipment;sit to/from stand Pt Will Transfer to Toilet: with modified independence;ambulating;bedside commode (over toilet) Pt Will Perform Toileting - Clothing Manipulation and hygiene: with modified independence;sit to/from stand Additional ADL Goal #1: Pt will generalize energy conservation strategies in ADL and mobility.  OT Frequency: Min 2X/week   Barriers to D/C:            Co-evaluation              End of Session Equipment Utilized During Treatment: Oxygen;Rolling walker;Gait belt  Activity Tolerance: Patient tolerated treatment well Patient left: in chair;with call bell/phone within reach   Time: 1031-1110 OT Time Calculation (min): 39 min Charges:  OT General Charges $OT Visit: 1 Procedure OT Evaluation $OT Eval Moderate Complexity: 1 Procedure OT Treatments $Self Care/Home Management : 8-22 mins G-Codes:    Evern Bio 06/14/2015, 11:23 AM [phone]

## 2015-06-14 NOTE — Progress Notes (Signed)
ANTICOAGULATION CONSULT NOTE - Follow Up Consult  Pharmacy Consult for Coumadin Indication: atrial fibrillation  Allergies  Allergen Reactions  . Yellow Dyes (Non-Tartrazine) Nausea And Vomiting    "deathly allergic" No other information given to explain reaction.  . Codeine Hives  . Other Other (See Comments)    novocaine broke mouth out  . Penicillins Hives  . Procaine Hives    Patient Measurements: Height: 5\' 2"  (157.5 cm) Weight: 148 lb 9.4 oz (67.4 kg) IBW/kg (Calculated) : 50.1  Vital Signs: Temp: 97.8 F (36.6 C) (01/18 1438) Temp Source: Oral (01/18 1438) BP: 120/43 mmHg (01/18 1438) Pulse Rate: 67 (01/18 1438)  Labs:  Recent Labs  06/12/15 0553 06/13/15 0217 06/13/15 1531 06/14/15 0339  HGB 7.6* 7.7*  --  8.1*  HCT 25.0* 25.4*  --  26.3*  PLT 229 261  --  247  LABPROT 28.9* 33.4*  --  37.9*  INR 2.78* 3.37*  --  3.99*  CREATININE 3.01* 3.71*  --  3.62*  TROPONINI 0.35* 0.81* 0.41*  --     Estimated Creatinine Clearance: 11 mL/min (by C-G formula based on Cr of 3.62).  Assessment: 82yo female admitted for acute-on-chronic respiratory failure. On coumadin 2.5mg  daily exc 3.75mg  every Mon PTA  for h/o PAF. Last dose at home was taken on 1/14. INR on admit was theapeutic at 2.69.  INR today increased to 3.99. Hg 8.1, pltc WNL, No bleeding noted. H/o Prior GI bleed 11/2014.  Pt allergic to yellow dye (2.5 mg and 5 mg tablets) Ok to use 1 mg Coumadin tablets with red dye.  Goal of Therapy:  INR 2-3 Monitor platelets by anticoagulation protocol: Yes   Plan:  Hold coumadin today Monitor daily INR, CBC, s/s of bleeding  Noah Delaine, RPh Clinical Pharmacist Pager: [phone] 06/14/2015 3:25 PM

## 2015-06-14 NOTE — Progress Notes (Addendum)
PROGRESS NOTE  Alexis [name] QHU:[id number] DOB: 28-Apr-1932 DOA: 06/11/2015 PCP: Elizabeth Palau, FNP  Admit HPI / Brief Narrative: 82 y.o. ? chronic respiratory failure on home oxygen 2/2 to COPD,  diastolic dysfunction [prior nl Myoview EF 70%]  paroxysmal atrial fibrillation, CHad2Vasc2 score=7 on coumadin Prior GI bleed 11/2014  HTN,  DM2 w/ neuropathy,  and bipolar ii  Admtited 06/11/15 c worsening dyspnea, wheezing, cough and increased fatigue of acute onset.  Patient called EMS, and continued to have dyspnea after supplemental oxygen, albuterol nebulizer treatment and Solu-Medrol was given. Troponins slightly elevated, suspect demand ischemia-- never had chest pain. Echo does not show wall motion abnormalities.   HPI/Subjective:  Fair Some acute SOB overnight No cp No n/v No overt SOB Eating and drinking  Assessment/Plan: Acute on chronic respiratory failure - COPD exacerbation  Much improved at present - wean nebulizers - begin to ambulate in AM with PT  Chronic diastolic CHF exacerbation Dry weight approximately 68 kg July 2016 - currently 67 kg  - no evidence of significant volume overload on physical exam -needs further diuresis  Elevated troponin -suspect from demand ischemia + renal failure -no further work-up  Atrial fibrillation CHA2DS2-VASc Score is 7 - continue anticoagulation with warfarin - rate presently controlled -pharmacy to dose coumadin as supratherapeutic  Hypertension Blood pressures currently well-controlled  DM2 - well controlled, A1c 6.3  CBG trend 108-243  CKD stage V Baseline creatinine appears to range 2.5-3.0 -Cr higher today- lasix stopped 1/17 am -resumed Demadex at lower dose 40 bid-->20 bid 1.18 -long discussion with son and patient-unsure what dialysis entails and is DNR.  -will need input from her nephrologist D. Hyman Hopes as OP  Normocytic anemia Likely related to renal function - no evidence of blood loss - follow  trend  -get anemia panel to determine Iron sats and need for Erythropoietic factors If troponins continue to increase, may need transfusion of blood  Hyperthyroidism Continue methimazole  Code Status: DNR - NO CODE  Family Communication: d/w son bedsdie Disposition Plan:  ambulate - monitor oxygen saturation and wean as able - PT/OT  Consultants: none  Procedures: none  Antibiotics: None  DVT prophylaxis: Warfarin per pharmacy  Objective: Blood pressure 158/51, pulse 67, temperature 97.7 F (36.5 C), temperature source Oral, resp. rate 18, height 5\' 2"  (1.575 m), weight 67.4 kg (148 lb 9.4 oz), SpO2 98 %.  Intake/Output Summary (Last 24 hours) at 06/14/15 1258 Last data filed at 06/14/15 1139  Gross per 24 hour  Intake    702 ml  Output   3200 ml  Net  -2498 ml     Exam: General: No acute respiratory distress at rest in bedside chair Lungs: Distant breath sounds throughout - no focal crackles - no wheeze Cardiovascular: Regular rate without murmur gallop or rub normal S1 and S2 Abdomen: Nontender, nondistended, soft, bowel sounds positive, no rebound, no ascites, no appreciable mass Extremities: No significant cyanosis, clubbing, or edema bilateral lower extremities  Data Reviewed:  Basic Metabolic Panel:  Recent Labs Lab 06/11/15 1755 06/12/15 0100 06/12/15 0553 06/13/15 0217 06/14/15 0339  NA 142  --  140 133* 136  K 4.1  --  3.6 4.7 4.3  CL 100*  --  103 95* 98*  CO2 27  --  28 28 27   GLUCOSE 174*  --  241* 130* 141*  BUN 51*  --  56* 73* 79*  CREATININE 2.59*  --  3.01* 3.71* 3.62*  CALCIUM 9.1  --  8.6* 8.6* 8.8*  PHOS  --  4.5  --   --   --     CBC:  Recent Labs Lab 06/11/15 1755 06/12/15 0553 06/13/15 0217 06/14/15 0339  WBC 8.5 6.0 7.7 8.5  HGB 9.1* 7.6* 7.7* 8.1*  HCT 30.2* 25.0* 25.4* 26.3*  MCV 85.3 84.7 85.2 85.1  PLT 311 229 261 247    Liver Function Tests:  Recent Labs Lab 06/12/15 0553  AST 20  ALT 15  ALKPHOS 41   BILITOT 0.6  PROT 5.6*  ALBUMIN 2.8*    Coags:  Recent Labs Lab 06/11/15 1755 06/12/15 0553 06/13/15 0217 06/14/15 0339  INR 2.69* 2.78* 3.37* 3.99*    Cardiac Enzymes:  Recent Labs Lab 06/12/15 0100 06/12/15 0553 06/13/15 0217 06/13/15 1531  TROPONINI 0.13* 0.35* 0.81* 0.41*    CBG:  Recent Labs Lab 06/13/15 1109 06/13/15 1619 06/13/15 2138 06/14/15 0638 06/14/15 1138  GLUCAP 159* 164* 198* 108* 243*    Recent Results (from the past 240 hour(s))  MRSA PCR Screening     Status: None   Collection Time: 06/11/15 11:20 PM  Result Value Ref Range Status   MRSA by PCR NEGATIVE NEGATIVE Final    Comment:        The GeneXpert MRSA Assay (FDA approved for NASAL specimens only), is one component of a comprehensive MRSA colonization surveillance program. It is not intended to diagnose MRSA infection nor to guide or monitor treatment for MRSA infections.        Scheduled Meds:  Scheduled Meds: . atorvastatin  20 mg Oral QHS  . cholecalciferol  1,000 Units Oral q morning - 10a  . escitalopram  10 mg Oral Daily  . fenofibrate  160 mg Oral Daily  . gabapentin  100 mg Oral Daily  . gabapentin  200 mg Oral QHS  . hydrALAZINE  25 mg Oral BID  . insulin aspart  0-15 Units Subcutaneous TID WC  . ipratropium  0.5 mg Nebulization TID  . iron polysaccharides  150 mg Oral BID  . isosorbide mononitrate  30 mg Oral Daily  . levalbuterol  1.25 mg Nebulization TID  . methimazole  5 mg Oral Daily  . metoprolol tartrate  25 mg Oral BID  . multivitamin with minerals  1 tablet Oral Daily  . pantoprazole  40 mg Oral Daily  . potassium chloride SA  20 mEq Oral Daily  . predniSONE  20 mg Oral Q breakfast  . Warfarin - Pharmacist Dosing Inpatient   Does not apply q1800    Time spent on care of this patient: 25 mins   Pleas Koch, MD [facility] [phone]

## 2015-06-14 NOTE — Progress Notes (Signed)
Lasix 20 mg I.V. Given. R.N. Aware.

## 2015-06-15 LAB — BASIC METABOLIC PANEL
ANION GAP: 8 (ref 5–15)
BUN: 84 mg/dL — AB (ref 6–20)
CALCIUM: 8.6 mg/dL — AB (ref 8.9–10.3)
CO2: 29 mmol/L (ref 22–32)
Chloride: 102 mmol/L (ref 101–111)
Creatinine, Ser: 3.23 mg/dL — ABNORMAL HIGH (ref 0.44–1.00)
GFR calc Af Amer: 14 mL/min — ABNORMAL LOW (ref >60)
GFR, EST NON AFRICAN AMERICAN: 12 mL/min — AB (ref >60)
GLUCOSE: 223 mg/dL — AB (ref 65–99)
Potassium: 4.2 mmol/L (ref 3.5–5.1)
Sodium: 139 mmol/L (ref 135–145)

## 2015-06-15 LAB — VITAMIN B12: Vitamin B-12: 553 pg/mL (ref 180–914)

## 2015-06-15 LAB — GLUCOSE, CAPILLARY
Glucose-Capillary: 110 mg/dL — ABNORMAL HIGH (ref 65–99)
Glucose-Capillary: 213 mg/dL — ABNORMAL HIGH (ref 65–99)

## 2015-06-15 LAB — IRON AND TIBC
IRON: 15 ug/dL — AB (ref 28–170)
Saturation Ratios: 4 % — ABNORMAL LOW (ref 10.4–31.8)
TIBC: 405 ug/dL (ref 250–450)
UIBC: 390 ug/dL

## 2015-06-15 LAB — CBC
HEMATOCRIT: 24.3 % — AB (ref 36.0–46.0)
Hemoglobin: 7.4 g/dL — ABNORMAL LOW (ref 12.0–15.0)
MCH: 26.1 pg (ref 26.0–34.0)
MCHC: 30.5 g/dL (ref 30.0–36.0)
MCV: 85.6 fL (ref 78.0–100.0)
PLATELETS: 206 10*3/uL (ref 150–400)
RBC: 2.84 MIL/uL — ABNORMAL LOW (ref 3.87–5.11)
RDW: 19.1 % — AB (ref 11.5–15.5)
WBC: 5.4 10*3/uL (ref 4.0–10.5)

## 2015-06-15 LAB — RETICULOCYTES
RBC.: 2.84 MIL/uL — ABNORMAL LOW (ref 3.87–5.11)
RETIC COUNT ABSOLUTE: 62.5 10*3/uL (ref 19.0–186.0)
RETIC CT PCT: 2.2 % (ref 0.4–3.1)

## 2015-06-15 LAB — PROTIME-INR
INR: 3.89 — ABNORMAL HIGH (ref 0.00–1.49)
Prothrombin Time: 37.2 seconds — ABNORMAL HIGH (ref 11.6–15.2)

## 2015-06-15 LAB — FOLATE: Folate: 22.8 ng/mL (ref >5.9)

## 2015-06-15 LAB — FERRITIN: FERRITIN: 87 ng/mL (ref 11–307)

## 2015-06-15 MED ORDER — SODIUM CHLORIDE 0.9 % IV SOLN
125.0000 mg | Freq: Once | INTRAVENOUS | Status: AC
  Administered 2015-06-15: 125 mg via INTRAVENOUS
  Filled 2015-06-15: qty 10

## 2015-06-15 MED ORDER — WARFARIN SODIUM 1 MG PO TABS: 1.0000 mg | ORAL_TABLET | Freq: Every day | ORAL | Status: DC

## 2015-06-15 MED ORDER — TORSEMIDE 20 MG PO TABS: 20.0000 mg | ORAL_TABLET | Freq: Two times a day (BID) | ORAL | Status: DC

## 2015-06-15 MED ORDER — DARBEPOETIN ALFA 60 MCG/0.3ML IJ SOSY
60.0000 ug | PREFILLED_SYRINGE | Freq: Once | INTRAMUSCULAR | Status: AC
  Administered 2015-06-15: 60 ug via SUBCUTANEOUS
  Filled 2015-06-15: qty 0.3

## 2015-06-15 MED ORDER — GABAPENTIN 100 MG PO CAPS: 100.0000 mg | ORAL_CAPSULE | Freq: Every day | ORAL | Status: DC

## 2015-06-15 NOTE — Care Management Note (Addendum)
Case Management Note Donn Pierini RN, BSN Unit 2W-Case Manager [phone]  Patient Details  Name: Alexis [name] MRN: [id number] Date of Birth: 07/20/1931  Subjective/Objective:    Pt admitted with acute on chronic resp. Failure-                 Action/Plan: PTA pt lived at home- has RW, rollator, and home 02. Orders for HH-RN/PT- spoke with pt at bedside- choice offered for [facility] agencies in [facility]- per pt she would like to use [facility] for [facility] services- referral called to [place] with [facility] for [facility] -RN(disease management) and PT- pt's address and phone # confirmed in epic. Call made to [facility] with [facility] to confirm if pt's home 02 is with [facility] or not per pt she does not remember- states her baseline liter flow is 2-3L. -return call from [facility] with confirmation that pt is active with [facility] for home 02.  Expected Discharge Date:    06/15/15              Expected Discharge Plan:  Home w Home Health Services  In-House Referral:     Discharge planning Services  CM Consult  Post Acute Care Choice:  Home Health Choice offered to:  Patient  DME Arranged:  Oxygen DME Agency:     HH Arranged:  RN, Disease Management, PT HH Agency:  [facility]  Status of Service:  Completed, signed off  Medicare Important Message Given:  Yes Date Medicare IM Given:    Medicare IM give by:    Date Additional Medicare IM Given:    Additional Medicare Important Message give by:     If discussed at Long Length of Stay Meetings, dates discussed:    Discharge Disposition: Home/home health   Additional Comments:  Darrold Span, RN 06/15/2015, 10:21 AM

## 2015-06-15 NOTE — Discharge Summary (Signed)
Physician Discharge Summary  Alexis [name] VFI:[id number] DOB: Sep 04, 1931 DOA: 06/11/2015  PCP: Elizabeth Palau, FNP  Admit date: 06/11/2015 Discharge date: 06/15/2015  Time spent: 35 minutes  Recommendations for Outpatient Follow-up:  1. Consider transition from Coumadin to Xarelto as an outpatient 2. Please recheck INR and complete blood count within 1 week as dosage changes of Coumadin have been made and she is currently on 1 mg starting from 06/17/15 3. Please coordinate with Dr. Hyman Hopes nephrology and let patient have a frank discussion about dialysis-she does not have a fistula and will need a detailed discussion regarding dialysis risk and benefits if she wishes to proceed 4. She should have a TSH performed in about one month when she is stable clinically as she is on methimazole 5. Note dosage changes of Demadex from 40 twice a day to 20 twice a day by mouth 6. No dosage change of gabapentin from 200 twice a day to 100 daily 7. Caution advised regarding use of glipizide and renal failure 8. Would advise IV iron infusions and Aranesp every 2 weekly 60 g if patient is still anemic on follow-up blood work 9. Patient will get home health PT/RN and home oxygen on discharge  Discharge Diagnoses:  Principal Problem:   Acute and chronic respiratory failure (acute-on-chronic) (HCC) Active Problems:   Hypertension   Type 2 diabetes mellitus, uncontrolled (HCC)   COPD exacerbation (HCC)   Renal insufficiency   Long-term (current) use of anticoagulants   Acute on chronic diastolic heart failure (HCC)   Hyperthyroidism   Discharge Condition: Fair  Diet recommendation: Renal low-salt fluid restricted 1200 cc  Filed Weights   06/13/15 0352 06/14/15 0352 06/15/15 0455  Weight: 67.268 kg (148 lb 4.8 oz) 67.4 kg (148 lb 9.4 oz) 65.545 kg (144 lb 8 oz)    History of present illness:  81 y.o. ? chronic respiratory failure on home oxygen 2/2 to COPD,  diastolic dysfunction [prior nl  Myoview EF 70%] paroxysmal atrial fibrillation, CHad2Vasc2 score=7 on coumadin Prior GI bleed 11/2014 HTN,  DM2 w/ neuropathy,  and bipolar ii  Admtited 06/11/15 c worsening dyspnea, wheezing, cough and increased fatigue of acute onset.  Patient called EMS, and continued to have dyspnea after supplemental oxygen, albuterol nebulizer treatment and Solu-Medrol was given. Troponins slightly elevated, suspect demand ischemia-- never had chest pain. Echo does not show wall motion abnormalities.   Hospital Course:  Acute on chronic respiratory failure - multifactorial Much improved at present - wean nebulizers - begin to ambulate in AM with PT It was felt that she had multifactorial respiratory failure There was a component of acute COPD exacerbation and also acute heart failure She completed 4 days of steroids and this was ultimately discontinued on discharge  Chronic diastolic CHF exacerbation Echocardiogram this admission showed EF 55-60% with-jugular filling pressures moderate regurgitation and tricuspid Dry weight approximately 68 kg July 2016 - currently 67 kg  - no evidence of significant volume overload on physical exam She was diuresis with IV Lasix initially and then because of a bump in her creatinine she was transitioned to by mouth Demadex at lower dose of her usual 40 twice a day and instead placed on 20 twice a day on discharge She was net negative 2.3 L at time of discharge  Elevated troponin -suspect from demand ischemia + renal failure -no further work-up  Atrial fibrillation CHA2DS2-VASc Score is 7 - continue anticoagulation with warfarin - rate presently controlled -Her Coumadin was held and patient was instructed to  restart 1 mg of Coumadin from 06/17/15 and get an INR checked by 06/21/15 -Given difficulty with supratherapeutic INR and may be beneficial to consider novel oral anticoagulant as an outpatient  Hypertension Blood pressures currently  well-controlled  DM2 - well controlled, A1c 6.3 CBG trend 108-243  CKD stage V Baseline creatinine appears to range 2.5-3.0 -Cr higher today- lasix stopped 1/17 am -resumed Demadex at lower dose 40 bid-->20 bid 1.18 -long discussion with son and patient-unsure what dialysis entails and is DNR.  -will need input from her nephrologist D. Hyman Hopes as OP  Normocytic anemia Likely related to renal function - no evidence of blood loss - follow trend  -get anemia panel to determine Iron sats and need for Erythropoietic factors If troponins continue to increase, may need transfusion of blood  Hyperthyroidism Continue methimazole Screening TSH as OP needed   Discharge Exam: Filed Vitals:   06/14/15 2323 06/15/15 0455  BP: 122/57 128/42  Pulse: 62 53  Temp:  98.4 F (36.9 C)  Resp:  16    General: Alert oriented breathing better no other issues no chest pain Cardiovascular: S1-S2 no murmur rub or gallop Respiratory: Clinically is clear No LE edema  Discharge Instructions   Discharge Instructions    Diet - low sodium heart healthy    Complete by:  As directed      Discharge instructions    Complete by:  As directed   Do not take coumadin for 2 days as your coumadin/INR levels were high.  recommend you re-start at a lower dose of 1 mg daily You will need blood counts performed in about one week and irregular physician's office and you will also need potential IV iron and erythropoietin because you have low blood counts. You will notice that your dose of Demadex has changed so please consult your medication list for some of these changes Please continue regular oxygen as an outpatient We will ask home health to come and see and we will ask nurse as well as therapy to come and work with you. Monitor blood sugars carefully as when you have kidney problems some of the blood sugar medications can cause your sugar levels but amount     Increase activity slowly    Complete by:  As  directed           Current Discharge Medication List    CONTINUE these medications which have CHANGED   Details  gabapentin (NEURONTIN) 100 MG capsule Take 1 capsule (100 mg total) by mouth daily. Qty: 30 capsule, Refills: 0    torsemide (DEMADEX) 20 MG tablet Take 1 tablet (20 mg total) by mouth 2 (two) times daily. Qty: 60 tablet, Refills: 0      CONTINUE these medications which have NOT CHANGED   Details  albuterol (PROVENTIL HFA;VENTOLIN HFA) 108 (90 BASE) MCG/ACT inhaler Inhale 2 puffs into the lungs every 6 (six) hours as needed for wheezing or shortness of breath. Qty: 1 Inhaler, Refills: 3    albuterol (PROVENTIL) (2.5 MG/3ML) 0.083% nebulizer solution Take 3 mLs (2.5 mg total) by nebulization every 6 (six) hours as needed for wheezing or shortness of breath. Qty: 75 mL, Refills: 0    aspirin EC 81 MG tablet Take 81 mg by mouth every 6 (six) hours as needed for moderate pain.    atorvastatin (LIPITOR) 20 MG tablet Take 20 mg by mouth at bedtime.    cholecalciferol (VITAMIN D) 1000 UNITS tablet Take 1,000 Units by mouth every morning.  escitalopram (LEXAPRO) 10 MG tablet Take 10 mg by mouth daily.    glipiZIDE (GLUCOTROL XL) 5 MG 24 hr tablet Take 5 mg by mouth daily with breakfast.    hydrALAZINE (APRESOLINE) 25 MG tablet TAKE 1 TABLET (25 MG TOTAL) BY MOUTH 2 (TWO) TIMES DAILY. Qty: 60 tablet, Refills: 6    isosorbide mononitrate (IMDUR) 30 MG 24 hr tablet TAKE 1 TABLET BY MOUTH EVERY DAY Qty: 30 tablet, Refills: 6    methimazole (TAPAZOLE) 5 MG tablet Take 1 tablet (5 mg total) by mouth 3 (three) times a week. Qty: 30 tablet, Refills: 2    metoprolol tartrate (LOPRESSOR) 25 MG tablet TAKE 1 TABLET BY MOUTH TWICE A DAY Qty: 60 tablet, Refills: 9    Multiple Vitamin (MULTIVITAMIN WITH MINERALS) TABS tablet Take 1 tablet by mouth daily.    OXYGEN Inhale 3 L into the lungs at bedtime.     polyethylene glycol (MIRALAX / GLYCOLAX) packet Take 17 g by mouth  daily as needed for moderate constipation.    potassium chloride SA (K-DUR,KLOR-CON) 20 MEQ tablet Take 1 tablet (20 mEq total) by mouth daily. Qty: 60 tablet, Refills: 2    fenofibrate 160 MG tablet Take 160 mg by mouth daily.    iron polysaccharides (NIFEREX) 150 MG capsule Take 1 capsule (150 mg total) by mouth 2 (two) times daily. Qty: 60 capsule, Refills: 0    pantoprazole (PROTONIX) 40 MG tablet Take 1 tablet (40 mg total) by mouth daily. Qty: 30 tablet, Refills: 0    predniSONE (DELTASONE) 20 MG tablet Take 2 tablets (40 mg total) by mouth daily with breakfast. Qty: 2 tablet, Refills: 0      STOP taking these medications     warfarin (COUMADIN) 2.5 MG tablet        Allergies  Allergen Reactions  . Yellow Dyes (Non-Tartrazine) Nausea And Vomiting    "deathly allergic" No other information given to explain reaction.  . Codeine Hives  . Other Other (See Comments)    novocaine broke mouth out  . Penicillins Hives  . Procaine Hives      The results of significant diagnostics from this hospitalization (including imaging, microbiology, ancillary and laboratory) are listed below for reference.    Significant Diagnostic Studies: Dg Chest Port 1 View  06/13/2015  CLINICAL DATA:  Sudden onset of shortness of breath, congestion P EXAM: PORTABLE CHEST 1 VIEW COMPARISON:  06/11/2015 FINDINGS: Cardiomegaly with vascular congestion and bilateral airspace opacities most compatible with edema/CHF. No significant effusions. No acute bony abnormality. IMPRESSION: Moderate CHF changes, stable since prior study. Electronically Signed   By: Charlett Nose M.D.   On: 06/13/2015 21:16   Dg Chest Portable 1 View  06/11/2015  CLINICAL DATA:  Shortness of breath EXAM: PORTABLE CHEST 1 VIEW COMPARISON:  12/22/2014 chest radiograph. FINDINGS: Stable cardiomediastinal silhouette with mild cardiomegaly. No pneumothorax. Likely small right pleural effusion. No left pleural effusion. Diffuse fluffy and  linear parahilar opacities throughout both lungs, worsened in the interval, probably moderate pulmonary edema. IMPRESSION: Mild cardiomegaly with diffuse fluffy and linear parahilar opacities throughout both lungs, favor moderate pulmonary edema due to moderate congestive heart failure. Small right pleural effusion. Electronically Signed   By: Delbert Phenix M.D.   On: 06/11/2015 17:57    Microbiology: Recent Results (from the past 240 hour(s))  MRSA PCR Screening     Status: None   Collection Time: 06/11/15 11:20 PM  Result Value Ref Range Status   MRSA by PCR  NEGATIVE NEGATIVE Final    Comment:        The GeneXpert MRSA Assay (FDA approved for NASAL specimens only), is one component of a comprehensive MRSA colonization surveillance program. It is not intended to diagnose MRSA infection nor to guide or monitor treatment for MRSA infections.      Labs: Basic Metabolic Panel:  Recent Labs Lab 06/11/15 1755 06/12/15 0100 06/12/15 0553 06/13/15 0217 06/14/15 0339 06/15/15 0235  NA 142  --  140 133* 136 139  K 4.1  --  3.6 4.7 4.3 4.2  CL 100*  --  103 95* 98* 102  CO2 27  --  28 28 27 29   GLUCOSE 174*  --  241* 130* 141* 223*  BUN 51*  --  56* 73* 79* 84*  CREATININE 2.59*  --  3.01* 3.71* 3.62* 3.23*  CALCIUM 9.1  --  8.6* 8.6* 8.8* 8.6*  PHOS  --  4.5  --   --   --   --    Liver Function Tests:  Recent Labs Lab 06/12/15 0553  AST 20  ALT 15  ALKPHOS 41  BILITOT 0.6  PROT 5.6*  ALBUMIN 2.8*   No results for input(s): LIPASE, AMYLASE in the last 168 hours. No results for input(s): AMMONIA in the last 168 hours. CBC:  Recent Labs Lab 06/11/15 1755 06/12/15 0553 06/13/15 0217 06/14/15 0339 06/15/15 0235  WBC 8.5 6.0 7.7 8.5 5.4  HGB 9.1* 7.6* 7.7* 8.1* 7.4*  HCT 30.2* 25.0* 25.4* 26.3* 24.3*  MCV 85.3 84.7 85.2 85.1 85.6  PLT 311 229 261 247 206   Cardiac Enzymes:  Recent Labs Lab 06/12/15 0100 06/12/15 0553 06/13/15 0217 06/13/15 1531   TROPONINI 0.13* 0.35* 0.81* 0.41*   BNP: BNP (last 3 results)  Recent Labs  12/17/14 0250 12/21/14 0745 06/11/15 1755  BNP 759.7* 557.5* 914.1*    ProBNP (last 3 results) No results for input(s): PROBNP in the last 8760 hours.  CBG:  Recent Labs Lab 06/14/15 0638 06/14/15 1138 06/14/15 1617 06/14/15 2157 06/15/15 0614  GLUCAP 108* 243* 167* 190* 110*       Signed:  Rhetta Mura MD   [facility] 06/15/2015, 9:37 AM

## 2015-06-15 NOTE — Progress Notes (Signed)
Pt asymptomatic and resting, pt Hgb this morning decreased to 7.4, yesterday pt Hgb was 8.1, MD notified, no new orders at this time, RN will continue to monitor, pt call bell within reach and bed in lowest position.    Mila Palmer, RN 06/15/2015

## 2015-06-20 ENCOUNTER — Inpatient Hospital Stay (HOSPITAL_COMMUNITY)
Admission: EM | Admit: 2015-06-20 | Discharge: 2015-06-23 | DRG: 291 | Disposition: A | Discharge status: Home / Self Care | Payer: Medicare Other | Attending: Family Medicine | Admitting: Family Medicine

## 2015-06-20 ENCOUNTER — Encounter (HOSPITAL_COMMUNITY): Payer: Self-pay | Admitting: *Deleted

## 2015-06-20 ENCOUNTER — Emergency Department (HOSPITAL_COMMUNITY): Payer: Medicare Other

## 2015-06-20 DIAGNOSIS — J441 Chronic obstructive pulmonary disease with (acute) exacerbation: Secondary | ICD-10-CM | POA: Diagnosis present

## 2015-06-20 DIAGNOSIS — I429 Cardiomyopathy, unspecified: Secondary | ICD-10-CM | POA: Diagnosis present

## 2015-06-20 DIAGNOSIS — J9601 Acute respiratory failure with hypoxia: Secondary | ICD-10-CM | POA: Diagnosis not present

## 2015-06-20 DIAGNOSIS — Z800 Family history of malignant neoplasm of digestive organs: Secondary | ICD-10-CM | POA: Diagnosis not present

## 2015-06-20 DIAGNOSIS — I480 Paroxysmal atrial fibrillation: Secondary | ICD-10-CM | POA: Diagnosis present

## 2015-06-20 DIAGNOSIS — J45909 Unspecified asthma, uncomplicated: Secondary | ICD-10-CM | POA: Diagnosis present

## 2015-06-20 DIAGNOSIS — E0590 Thyrotoxicosis, unspecified without thyrotoxic crisis or storm: Secondary | ICD-10-CM | POA: Diagnosis present

## 2015-06-20 DIAGNOSIS — J9621 Acute and chronic respiratory failure with hypoxia: Secondary | ICD-10-CM | POA: Diagnosis present

## 2015-06-20 DIAGNOSIS — Z880 Allergy status to penicillin: Secondary | ICD-10-CM | POA: Diagnosis not present

## 2015-06-20 DIAGNOSIS — I130 Hypertensive heart and chronic kidney disease with heart failure and stage 1 through stage 4 chronic kidney disease, or unspecified chronic kidney disease: Principal | ICD-10-CM | POA: Diagnosis present

## 2015-06-20 DIAGNOSIS — E1165 Type 2 diabetes mellitus with hyperglycemia: Secondary | ICD-10-CM | POA: Diagnosis present

## 2015-06-20 DIAGNOSIS — IMO0002 Reserved for concepts with insufficient information to code with codable children: Secondary | ICD-10-CM | POA: Diagnosis present

## 2015-06-20 DIAGNOSIS — S99922S Unspecified injury of left foot, sequela: Secondary | ICD-10-CM

## 2015-06-20 DIAGNOSIS — N184 Chronic kidney disease, stage 4 (severe): Secondary | ICD-10-CM | POA: Diagnosis present

## 2015-06-20 DIAGNOSIS — E1140 Type 2 diabetes mellitus with diabetic neuropathy, unspecified: Secondary | ICD-10-CM | POA: Diagnosis present

## 2015-06-20 DIAGNOSIS — J9600 Acute respiratory failure, unspecified whether with hypoxia or hypercapnia: Secondary | ICD-10-CM | POA: Diagnosis present

## 2015-06-20 DIAGNOSIS — I10 Essential (primary) hypertension: Secondary | ICD-10-CM | POA: Diagnosis not present

## 2015-06-20 DIAGNOSIS — N179 Acute kidney failure, unspecified: Secondary | ICD-10-CM | POA: Diagnosis present

## 2015-06-20 DIAGNOSIS — H3530 Unspecified macular degeneration: Secondary | ICD-10-CM | POA: Diagnosis present

## 2015-06-20 DIAGNOSIS — Z9981 Dependence on supplemental oxygen: Secondary | ICD-10-CM

## 2015-06-20 DIAGNOSIS — I160 Hypertensive urgency: Secondary | ICD-10-CM | POA: Diagnosis present

## 2015-06-20 DIAGNOSIS — I5032 Chronic diastolic (congestive) heart failure: Secondary | ICD-10-CM | POA: Diagnosis not present

## 2015-06-20 DIAGNOSIS — E1122 Type 2 diabetes mellitus with diabetic chronic kidney disease: Secondary | ICD-10-CM | POA: Diagnosis present

## 2015-06-20 DIAGNOSIS — Z886 Allergy status to analgesic agent status: Secondary | ICD-10-CM | POA: Diagnosis not present

## 2015-06-20 DIAGNOSIS — J449 Chronic obstructive pulmonary disease, unspecified: Secondary | ICD-10-CM

## 2015-06-20 DIAGNOSIS — Z803 Family history of malignant neoplasm of breast: Secondary | ICD-10-CM

## 2015-06-20 DIAGNOSIS — Z90710 Acquired absence of both cervix and uterus: Secondary | ICD-10-CM

## 2015-06-20 DIAGNOSIS — I5033 Acute on chronic diastolic (congestive) heart failure: Secondary | ICD-10-CM | POA: Diagnosis present

## 2015-06-20 DIAGNOSIS — Z7901 Long term (current) use of anticoagulants: Secondary | ICD-10-CM | POA: Diagnosis not present

## 2015-06-20 DIAGNOSIS — Z801 Family history of malignant neoplasm of trachea, bronchus and lung: Secondary | ICD-10-CM | POA: Diagnosis not present

## 2015-06-20 DIAGNOSIS — Z888 Allergy status to other drugs, medicaments and biological substances status: Secondary | ICD-10-CM | POA: Diagnosis not present

## 2015-06-20 DIAGNOSIS — Z7982 Long term (current) use of aspirin: Secondary | ICD-10-CM | POA: Diagnosis not present

## 2015-06-20 DIAGNOSIS — Z79899 Other long term (current) drug therapy: Secondary | ICD-10-CM

## 2015-06-20 DIAGNOSIS — I509 Heart failure, unspecified: Secondary | ICD-10-CM | POA: Diagnosis not present

## 2015-06-20 DIAGNOSIS — D638 Anemia in other chronic diseases classified elsewhere: Secondary | ICD-10-CM | POA: Diagnosis present

## 2015-06-20 DIAGNOSIS — J189 Pneumonia, unspecified organism: Secondary | ICD-10-CM

## 2015-06-20 DIAGNOSIS — Z66 Do not resuscitate: Secondary | ICD-10-CM | POA: Diagnosis present

## 2015-06-20 LAB — COMPREHENSIVE METABOLIC PANEL
ALT: 19 U/L (ref 14–54)
AST: 27 U/L (ref 15–41)
Albumin: 3.7 g/dL (ref 3.5–5.0)
Alkaline Phosphatase: 62 U/L (ref 38–126)
Anion gap: 15 (ref 5–15)
BUN: 55 mg/dL — ABNORMAL HIGH (ref 6–20)
CALCIUM: 9.7 mg/dL (ref 8.9–10.3)
CHLORIDE: 103 mmol/L (ref 101–111)
CO2: 27 mmol/L (ref 22–32)
CREATININE: 2.49 mg/dL — AB (ref 0.44–1.00)
GFR, EST AFRICAN AMERICAN: 20 mL/min — AB (ref >60)
GFR, EST NON AFRICAN AMERICAN: 17 mL/min — AB (ref >60)
Glucose, Bld: 106 mg/dL — ABNORMAL HIGH (ref 65–99)
Potassium: 3.9 mmol/L (ref 3.5–5.1)
Sodium: 145 mmol/L (ref 135–145)
Total Bilirubin: 0.6 mg/dL (ref 0.3–1.2)
Total Protein: 7.3 g/dL (ref 6.5–8.1)

## 2015-06-20 LAB — POCT I-STAT 3, ART BLOOD GAS (G3+)
Acid-Base Excess: 2 mmol/L (ref 0.0–2.0)
Bicarbonate: 27.5 mEq/L — ABNORMAL HIGH (ref 20.0–24.0)
O2 Saturation: 98 %
PCO2 ART: 49.8 mmHg — AB (ref 35.0–45.0)
TCO2: 29 mmol/L (ref 0–100)
pH, Arterial: 7.351 (ref 7.350–7.450)
pO2, Arterial: 118 mmHg — ABNORMAL HIGH (ref 80.0–100.0)

## 2015-06-20 LAB — I-STAT ARTERIAL BLOOD GAS, ED
Acid-Base Excess: 3 mmol/L — ABNORMAL HIGH (ref 0.0–2.0)
Bicarbonate: 29 mEq/L — ABNORMAL HIGH (ref 20.0–24.0)
O2 Saturation: 96 %
PCO2 ART: 52.9 mmHg — AB (ref 35.0–45.0)
Patient temperature: 98.6
TCO2: 31 mmol/L (ref 0–100)
pH, Arterial: 7.346 — ABNORMAL LOW (ref 7.350–7.450)
pO2, Arterial: 89 mmHg (ref 80.0–100.0)

## 2015-06-20 LAB — CBC WITH DIFFERENTIAL/PLATELET
BASOS ABS: 0 10*3/uL (ref 0.0–0.1)
BASOS PCT: 0 %
EOS PCT: 3 %
Eosinophils Absolute: 0.3 10*3/uL (ref 0.0–0.7)
HCT: 33.9 % — ABNORMAL LOW (ref 36.0–46.0)
Hemoglobin: 10 g/dL — ABNORMAL LOW (ref 12.0–15.0)
Lymphocytes Relative: 15 %
Lymphs Abs: 1.6 10*3/uL (ref 0.7–4.0)
MCH: 25.9 pg — ABNORMAL LOW (ref 26.0–34.0)
MCHC: 29.5 g/dL — ABNORMAL LOW (ref 30.0–36.0)
MCV: 87.8 fL (ref 78.0–100.0)
MONO ABS: 0.4 10*3/uL (ref 0.1–1.0)
Monocytes Relative: 4 %
NEUTROS ABS: 8.2 10*3/uL — AB (ref 1.7–7.7)
Neutrophils Relative %: 78 %
PLATELETS: 385 10*3/uL (ref 150–400)
RBC: 3.86 MIL/uL — ABNORMAL LOW (ref 3.87–5.11)
RDW: 18.3 % — AB (ref 11.5–15.5)
WBC: 10.5 10*3/uL (ref 4.0–10.5)

## 2015-06-20 LAB — URINALYSIS, ROUTINE W REFLEX MICROSCOPIC
BILIRUBIN URINE: NEGATIVE
Glucose, UA: NEGATIVE mg/dL
Hgb urine dipstick: NEGATIVE
KETONES UR: NEGATIVE mg/dL
NITRITE: NEGATIVE
PH: 7 (ref 5.0–8.0)
Protein, ur: 100 mg/dL — AB
Specific Gravity, Urine: 1.012 (ref 1.005–1.030)

## 2015-06-20 LAB — URINE MICROSCOPIC-ADD ON: RBC / HPF: NONE SEEN RBC/hpf (ref 0–5)

## 2015-06-20 LAB — GLUCOSE, CAPILLARY: Glucose-Capillary: 146 mg/dL — ABNORMAL HIGH (ref 65–99)

## 2015-06-20 LAB — BRAIN NATRIURETIC PEPTIDE: B NATRIURETIC PEPTIDE 5: 1716.4 pg/mL — AB (ref 0.0–100.0)

## 2015-06-20 LAB — MRSA PCR SCREENING: MRSA by PCR: NEGATIVE

## 2015-06-20 LAB — PROTIME-INR
INR: 1.25 (ref 0.00–1.49)
PROTHROMBIN TIME: 15.8 s — AB (ref 11.6–15.2)

## 2015-06-20 LAB — TROPONIN I

## 2015-06-20 MED ORDER — DEXTROSE 5 % IV SOLN
INTRAVENOUS | Status: DC
  Administered 2015-06-20: 21:00:00 via INTRAVENOUS

## 2015-06-20 MED ORDER — METOPROLOL TARTRATE 12.5 MG HALF TABLET
12.5000 mg | ORAL_TABLET | Freq: Two times a day (BID) | ORAL | Status: DC
  Administered 2015-06-21 – 2015-06-23 (×5): 12.5 mg via ORAL
  Filled 2015-06-20 (×6): qty 1

## 2015-06-20 MED ORDER — METHIMAZOLE 5 MG PO TABS
5.0000 mg | ORAL_TABLET | ORAL | Status: DC
  Administered 2015-06-21: 5 mg via ORAL
  Filled 2015-06-20 (×2): qty 1

## 2015-06-20 MED ORDER — CLINDAMYCIN PHOSPHATE 600 MG/50ML IV SOLN
600.0000 mg | Freq: Three times a day (TID) | INTRAVENOUS | Status: DC
  Administered 2015-06-20 – 2015-06-21 (×3): 600 mg via INTRAVENOUS
  Filled 2015-06-20 (×3): qty 50

## 2015-06-20 MED ORDER — HEPARIN (PORCINE) IN NACL 100-0.45 UNIT/ML-% IJ SOLN
950.0000 [IU]/h | INTRAMUSCULAR | Status: DC
  Administered 2015-06-20: 800 [IU]/h via INTRAVENOUS
  Filled 2015-06-20: qty 250

## 2015-06-20 MED ORDER — WARFARIN - PHARMACIST DOSING INPATIENT
Freq: Every day | Status: DC
  Administered 2015-06-21: 18:00:00

## 2015-06-20 MED ORDER — HEPARIN BOLUS VIA INFUSION
2500.0000 [IU] | Freq: Once | INTRAVENOUS | Status: AC
  Administered 2015-06-20: 2500 [IU] via INTRAVENOUS
  Filled 2015-06-20: qty 2500

## 2015-06-20 MED ORDER — LORAZEPAM 2 MG/ML IJ SOLN: 1.0000 mg | Freq: Once | INTRAMUSCULAR | Status: DC

## 2015-06-20 MED ORDER — NITROGLYCERIN IN D5W 200-5 MCG/ML-% IV SOLN
5.0000 ug/min | INTRAVENOUS | Status: DC
  Administered 2015-06-20: 5 ug/min via INTRAVENOUS
  Filled 2015-06-20: qty 250

## 2015-06-20 MED ORDER — ALBUTEROL (5 MG/ML) CONTINUOUS INHALATION SOLN
10.0000 mg/h | INHALATION_SOLUTION | Freq: Once | RESPIRATORY_TRACT | Status: AC
  Administered 2015-06-20: 10 mg/h via RESPIRATORY_TRACT
  Filled 2015-06-20: qty 20

## 2015-06-20 MED ORDER — WARFARIN SODIUM 2 MG PO TABS
2.0000 mg | ORAL_TABLET | Freq: Once | ORAL | Status: DC
  Filled 2015-06-20: qty 1

## 2015-06-20 MED ORDER — ESCITALOPRAM OXALATE 10 MG PO TABS
10.0000 mg | ORAL_TABLET | Freq: Every day | ORAL | Status: DC
  Administered 2015-06-21 – 2015-06-23 (×3): 10 mg via ORAL
  Filled 2015-06-20 (×3): qty 1

## 2015-06-20 MED ORDER — FUROSEMIDE 10 MG/ML IJ SOLN
40.0000 mg | Freq: Once | INTRAMUSCULAR | Status: AC
  Administered 2015-06-20: 40 mg via INTRAVENOUS
  Filled 2015-06-20: qty 4

## 2015-06-20 MED ORDER — ISOSORBIDE MONONITRATE ER 30 MG PO TB24
30.0000 mg | ORAL_TABLET | Freq: Every day | ORAL | Status: DC
  Administered 2015-06-22 – 2015-06-23 (×2): 30 mg via ORAL
  Filled 2015-06-20 (×3): qty 1

## 2015-06-20 MED ORDER — NITROGLYCERIN 2 % TD OINT
1.0000 [in_us] | TOPICAL_OINTMENT | Freq: Once | TRANSDERMAL | Status: AC
  Administered 2015-06-20: 1 [in_us] via TOPICAL
  Filled 2015-06-20: qty 1

## 2015-06-20 MED ORDER — IPRATROPIUM BROMIDE 0.02 % IN SOLN
0.5000 mg | Freq: Once | RESPIRATORY_TRACT | Status: AC
  Administered 2015-06-20: 0.5 mg via RESPIRATORY_TRACT
  Filled 2015-06-20: qty 2.5

## 2015-06-20 MED ORDER — LORAZEPAM 2 MG/ML IJ SOLN
1.0000 mg | Freq: Once | INTRAMUSCULAR | Status: AC
  Administered 2015-06-20: 1 mg via INTRAVENOUS
  Filled 2015-06-20: qty 1

## 2015-06-20 MED ORDER — HYDRALAZINE HCL 20 MG/ML IJ SOLN: 10.0000 mg | INTRAMUSCULAR | Status: DC | PRN

## 2015-06-20 NOTE — ED Notes (Addendum)
Pt states she is able to breath better with bipap. 95%

## 2015-06-20 NOTE — ED Notes (Signed)
Pt comes from home with respiratory distress starting around 1500.  PT lunges diminished.  Pt was D/c'd last Thursday with fluid overload. Hx of CHF, asthma and COPD GEMS: 10 alb, 1atrovent, 125mg  solumedrol , 20 L hand. 204/86, 75, 97%, 20.

## 2015-06-20 NOTE — ED Notes (Signed)
X-ray at bedside

## 2015-06-20 NOTE — Progress Notes (Addendum)
ANTICOAGULATION and ANTIBIOTIC CONSULT NOTE - Initial Consult  Pharmacy Consult for Heparin > Coumadin and Zosyn Indication: atrial fibrillation; cellulitis  Allergies  Allergen Reactions  . Yellow Dyes (Non-Tartrazine) Nausea And Vomiting    "deathly allergic" No other information given to explain reaction.  . Codeine Hives  . Other Other (See Comments)    novocaine broke mouth out  . Penicillins Hives  . Procaine Hives    Patient Measurements:    Height 62 inches Weight 65.5 kg IBW 50.1 kg Heparin Dosing Weight: 63 kg  Vital Signs: Temp: 98.4 F (36.9 C) (01/24 1700) Temp Source: Oral (01/24 1700) BP: 152/50 mmHg (01/24 1908) Pulse Rate: 82 (01/24 1908)  Labs:  Recent Labs  06/20/15 1630  HGB 10.0*  HCT 33.9*  PLT 385  LABPROT 15.8*  INR 1.25  CREATININE 2.49*  TROPONINI <0.03    Estimated Creatinine Clearance: 15.7 mL/min (by C-G formula based on Cr of 2.49).   Medical History: Past Medical History  Diagnosis Date  . Hypertension   . Neuropathy (HCC)   . Anxiety   . PONV (postoperative nausea and vomiting)   . CHF (congestive heart failure) (HCC)   . COPD (chronic obstructive pulmonary disease) (HCC)     on home, nocturnal oxygen.   . Family history of adverse reaction to anesthesia   . Cardiomyopathy (HCC)   . Diabetes mellitus without complication (HCC)     TYPE 2  . Macular degeneration, bilateral   . Varicose veins   . GIB (gastrointestinal bleeding)   . Paroxysmal atrial fibrillation (HCC)   . Shortness of breath dyspnea   . Acute on chronic diastolic heart failure (HCC) 12/22/2014    Medications:  Prescriptions prior to admission  Medication Sig Dispense Refill Last Dose  . albuterol (PROVENTIL HFA;VENTOLIN HFA) 108 (90 BASE) MCG/ACT inhaler Inhale 2 puffs into the lungs every 6 (six) hours as needed for wheezing or shortness of breath. 1 Inhaler 3 PRN  . aspirin EC 81 MG tablet Take 81 mg by mouth every 6 (six) hours as needed for  moderate pain.   PRN  . atorvastatin (LIPITOR) 20 MG tablet Take 20 mg by mouth at bedtime.   06/19/2015 at Unknown time  . cholecalciferol (VITAMIN D) 1000 UNITS tablet Take 1,000 Units by mouth every morning.    06/20/2015 at Unknown time  . escitalopram (LEXAPRO) 10 MG tablet Take 10 mg by mouth daily.   06/20/2015 at Unknown time  . fenofibrate 160 MG tablet Take 160 mg by mouth daily.   06/20/2015 at Unknown time  . gabapentin (NEURONTIN) 100 MG capsule Take 1 capsule (100 mg total) by mouth daily. 30 capsule 0 06/19/2015 at Unknown time  . glipiZIDE (GLUCOTROL XL) 5 MG 24 hr tablet Take 5 mg by mouth daily with breakfast.   06/20/2015 at Unknown time  . hydrALAZINE (APRESOLINE) 25 MG tablet TAKE 1 TABLET (25 MG TOTAL) BY MOUTH 2 (TWO) TIMES DAILY. 60 tablet 6 06/20/2015 at Unknown time  . isosorbide mononitrate (IMDUR) 30 MG 24 hr tablet TAKE 1 TABLET BY MOUTH EVERY DAY 30 tablet 6 06/20/2015 at Unknown time  . methimazole (TAPAZOLE) 5 MG tablet Take 1 tablet (5 mg total) by mouth 3 (three) times a week. (Patient taking differently: Take 5 mg by mouth daily. ) 30 tablet 2 06/19/2015 at Unknown time  . metoprolol tartrate (LOPRESSOR) 25 MG tablet TAKE 1 TABLET BY MOUTH TWICE A DAY 60 tablet 9 06/20/2015 at Unknown time  .  Multiple Vitamin (MULTIVITAMIN WITH MINERALS) TABS tablet Take 1 tablet by mouth daily.   06/20/2015 at Unknown time  . OXYGEN Inhale 3 L into the lungs at bedtime.    06/19/2015 at Unknown time  . potassium chloride SA (K-DUR,KLOR-CON) 20 MEQ tablet Take 1 tablet (20 mEq total) by mouth daily. 60 tablet 2 06/20/2015 at Unknown time  . predniSONE (DELTASONE) 20 MG tablet Take 2 tablets (40 mg total) by mouth daily with breakfast. 2 tablet 0 06/20/2015 at Unknown time  . torsemide (DEMADEX) 20 MG tablet Take 1 tablet (20 mg total) by mouth 2 (two) times daily. 60 tablet 0 06/20/2015 at Unknown time  . warfarin (COUMADIN) 1 MG tablet Take 1 tablet (1 mg total) by mouth daily. 5 tablet 0  06/18/2015    Assessment: 82 yo F presented to ED with increased SOB.  Suspect HF exacerbation and has started on IV Lasix.  Pt has some baseline CKD with SCr 2.5 on admit.  Pt on Coumadin PTA with sub therapeutic INR.  Will start heparin > Coumadin bridge.  Also noted cellulitis, possible osteo of L great toe.  To begin Zosyn.  PMH: COPD on home O2, diastolic HF, PAF (Chads2Vasc=7), GIB (11/2014), HTN, DM, bipolar disorder  Goal of Therapy:  INR 2-3 Heparin level 0.3-0.7 units/ml Monitor platelets by anticoagulation protocol: Yes   Plan:  Heparin 2500 unit IV bolus x 1 Heparin infusion at 800 units/hr Heparin level in 8 hours Coumadin 2mg  PO x 1 tonight Daily heparin level, CBC, and INR.  Patient reports a PCN allergy = hives.  Paging NP oncall before starting Zosyn.  Pt has tolerated cephalosporins in the past.  Will follow-up.  [employer], Pharm.D., BCPS Clinical Pharmacist Pager [phone] 06/20/2015 8:28 PM   Addendum: Spoke with Dr. Clyde Lundborg about PCN allergy.  Will change antibiotics to Clindamycin for tonight.  Primary team to re-eval in the AM.  Dixie Dials, Pharm.D., BCPS Clinical Pharmacist Pager [phone] 06/20/2015 9:39 PM

## 2015-06-20 NOTE — ED Provider Notes (Signed)
CSN: [id number]     Arrival date & time 06/20/15  1523 History   First MD Initiated Contact with Patient 06/20/15 1525     Chief Complaint  Patient presents with  . Respiratory Distress     (Consider location/radiation/quality/duration/timing/severity/associated sxs/prior Treatment) HPI Patient discharged from the hospital 5 days ago. Patient reports she felt fairly well at that time. Today she developed shortness of breath over a short period of time. No chest pain. No fever no cough. Patient does have COPD and is on 2 L of home oxygen. She reports she has been compliant with her medications since discharge. She reports she was able to feel all prescribed medications. She has not had increasing lower extremity swelling. Incidentally she injured her left great toe but she is not sure how. No vomiting or diarrhea. No abdominal pain. Patient is a nonsmoker. EMS provided Solu-Medrol 125 mg, Atrovent one dose, continuous albuterol 10 mg.  Patient reports she would not want intubation or to be placed on a ventilator. Past Medical History  Diagnosis Date  . Hypertension   . Neuropathy (HCC)   . Anxiety   . PONV (postoperative nausea and vomiting)   . CHF (congestive heart failure) (HCC)   . COPD (chronic obstructive pulmonary disease) (HCC)     on home, nocturnal oxygen.   . Family history of adverse reaction to anesthesia   . Cardiomyopathy (HCC)   . Diabetes mellitus without complication (HCC)     TYPE 2  . Macular degeneration, bilateral   . Varicose veins   . GIB (gastrointestinal bleeding)   . Paroxysmal atrial fibrillation (HCC)   . Shortness of breath dyspnea   . Acute on chronic diastolic heart failure (HCC) 12/22/2014   Past Surgical History  Procedure Laterality Date  . Hip fracture surgery  2004  . Abdominal hysterectomy    . Vein surgery    . Esophagogastroduodenoscopy N/A 12/01/2014    Procedure: ESOPHAGOGASTRODUODENOSCOPY (EGD);  Surgeon: Hilarie Fredrickson, MD;  Location: [facility]  ENDOSCOPY;  Service: Endoscopy;  Laterality: N/A;   Family History  Problem Relation Age of Onset  . Colon cancer Father 40    advanced when discovered, no surgery or therapy.  this led to his death  . Breast cancer Sister   . Cancer Brother   . Breast cancer Mother   . Lung cancer Brother   . Thyroid disease Neg Hx    Social History  Substance Use Topics  . Smoking status: Passive Smoke Exposure - Never Smoker  . Smokeless tobacco: Never Used  . Alcohol Use: No   OB History    No data available     Review of Systems 10 Systems reviewed and are negative for acute change except as noted in the HPI. Allergies  Yellow dyes (non-tartrazine); Codeine; Other; Penicillins; and Procaine  Home Medications   Prior to Admission medications   Medication Sig Start Date End Date Taking? Authorizing Provider  albuterol (PROVENTIL HFA;VENTOLIN HFA) 108 (90 BASE) MCG/ACT inhaler Inhale 2 puffs into the lungs every 6 (six) hours as needed for wheezing or shortness of breath. 06/11/13  Yes Nishant Dhungel, MD  aspirin EC 81 MG tablet Take 81 mg by mouth every 6 (six) hours as needed for moderate pain.   Yes Historical Provider, MD  atorvastatin (LIPITOR) 20 MG tablet Take 20 mg by mouth at bedtime. 10/12/14 10/12/15 Yes Historical Provider, MD  cholecalciferol (VITAMIN D) 1000 UNITS tablet Take 1,000 Units by mouth every morning.  Yes Historical Provider, MD  escitalopram (LEXAPRO) 10 MG tablet Take 10 mg by mouth daily.   Yes Historical Provider, MD  fenofibrate 160 MG tablet Take 160 mg by mouth daily.   Yes Historical Provider, MD  gabapentin (NEURONTIN) 100 MG capsule Take 1 capsule (100 mg total) by mouth daily. 06/15/15  Yes Rhetta Mura, MD  glipiZIDE (GLUCOTROL XL) 5 MG 24 hr tablet Take 5 mg by mouth daily with breakfast.   Yes Historical Provider, MD  hydrALAZINE (APRESOLINE) 25 MG tablet TAKE 1 TABLET (25 MG TOTAL) BY MOUTH 2 (TWO) TIMES DAILY. 01/31/15  Yes Chrystie Nose, MD   isosorbide mononitrate (IMDUR) 30 MG 24 hr tablet TAKE 1 TABLET BY MOUTH EVERY DAY 01/31/15  Yes Chrystie Nose, MD  methimazole (TAPAZOLE) 5 MG tablet Take 1 tablet (5 mg total) by mouth 3 (three) times a week. Patient taking differently: Take 5 mg by mouth daily.  01/23/15  Yes Romero Belling, MD  metoprolol tartrate (LOPRESSOR) 25 MG tablet TAKE 1 TABLET BY MOUTH TWICE A DAY 02/27/15  Yes Chrystie Nose, MD  Multiple Vitamin (MULTIVITAMIN WITH MINERALS) TABS tablet Take 1 tablet by mouth daily.   Yes Historical Provider, MD  OXYGEN Inhale 3 L into the lungs at bedtime.    Yes Historical Provider, MD  potassium chloride SA (K-DUR,KLOR-CON) 20 MEQ tablet Take 1 tablet (20 mEq total) by mouth daily. 12/25/14  Yes Lora Paula, MD  predniSONE (DELTASONE) 20 MG tablet Take 2 tablets (40 mg total) by mouth daily with breakfast. 12/25/14  Yes Lora Paula, MD  torsemide (DEMADEX) 20 MG tablet Take 1 tablet (20 mg total) by mouth 2 (two) times daily. 06/15/15  Yes Rhetta Mura, MD  warfarin (COUMADIN) 1 MG tablet Take 1 tablet (1 mg total) by mouth daily. 06/17/15   Rhetta Mura, MD   BP 154/55 mmHg  Pulse 89  Resp 18  SpO2 95% Physical Exam  Constitutional: She is oriented to person, place, and time. She appears well-developed and well-nourished.  Patient is alert and nontoxic. She does have moderate respiratory distress. He is currently getting a nebulizer treatment through a nonrebreather mask. She is speaking in short sentences.  HENT:  Head: Normocephalic and atraumatic.  Mouth/Throat: Oropharynx is clear and moist.  Eyes: EOM are normal. Pupils are equal, round, and reactive to light.  Neck: Neck supple.  Cardiovascular: Normal rate, regular rhythm, normal heart sounds and intact distal pulses.   Pulmonary/Chest: She has rales.  Basal rails. Decreased breath sounds to mid lung fields. No significant wheeze.  Abdominal: Soft. Bowel sounds are normal. She exhibits no  distension. There is no tenderness.  Musculoskeletal: Normal range of motion. She exhibits no edema or tenderness.  great left toe has thickened and long toenail which appears to been partially avulsed. There is some fresh blood but no active bleeding. The toe in its entirety is ecchymotic appearing  Neurological: She is alert and oriented to person, place, and time. She has normal strength. She exhibits normal muscle tone. Coordination normal. GCS eye subscore is 4. GCS verbal subscore is 5. GCS motor subscore is 6.  Skin: Skin is warm, dry and intact.  Psychiatric: She has a normal mood and affect.    ED Course  Procedures (including critical care time) CRITICAL CARE Performed by: Arby Barrette   Total critical care time: 45 minutes  Critical care time was exclusive of separately billable procedures and treating other patients.  Critical care was  necessary to treat or prevent imminent or life-threatening deterioration.  Critical care was time spent personally by me on the following activities: development of treatment plan with patient and/or surrogate as well as nursing, discussions with consultants, evaluation of patient's response to treatment, examination of patient, obtaining history from patient or surrogate, ordering and performing treatments and interventions, ordering and review of laboratory studies, ordering and review of radiographic studies, pulse oximetry and re-evaluation of patient's condition. Labs Review Labs Reviewed  COMPREHENSIVE METABOLIC PANEL - Abnormal; Notable for the following:    Glucose, Bld 106 (*)    BUN 55 (*)    Creatinine, Ser 2.49 (*)    GFR calc non Af Amer 17 (*)    GFR calc Af Amer 20 (*)    All other components within normal limits  BRAIN NATRIURETIC PEPTIDE - Abnormal; Notable for the following:    B Natriuretic Peptide 1716.4 (*)    All other components within normal limits  CBC WITH DIFFERENTIAL/PLATELET - Abnormal; Notable for the  following:    RBC 3.86 (*)    Hemoglobin 10.0 (*)    HCT 33.9 (*)    MCH 25.9 (*)    MCHC 29.5 (*)    RDW 18.3 (*)    Neutro Abs 8.2 (*)    All other components within normal limits  PROTIME-INR - Abnormal; Notable for the following:    Prothrombin Time 15.8 (*)    All other components within normal limits  URINALYSIS, ROUTINE W REFLEX MICROSCOPIC (NOT AT [facility]) - Abnormal; Notable for the following:    APPearance HAZY (*)    Protein, ur 100 (*)    Leukocytes, UA SMALL (*)    All other components within normal limits  URINE MICROSCOPIC-ADD ON - Abnormal; Notable for the following:    Squamous Epithelial / LPF 0-5 (*)    Bacteria, UA FEW (*)    Casts HYALINE CASTS (*)    All other components within normal limits  CULTURE, BLOOD (ROUTINE X 2)  CULTURE, BLOOD (ROUTINE X 2)  TROPONIN I  BLOOD GAS, ARTERIAL    Imaging Review Dg Chest Port 1 View  06/20/2015  CLINICAL DATA:  Respiratory distress, shortness of breath, fluid overload, history CHF, asthma, COPD, diabetes mellitus, cardiomyopathy, hypertension, paroxysmal atrial fibrillation EXAM: PORTABLE CHEST 1 VIEW COMPARISON:  Portable exam 1540 hours compared to 06/13/2015 FINDINGS: Enlargement of cardiac silhouette with pulmonary vascular congestion. Interstitial infiltrates bilaterally compatible with pulmonary edema and CHF, little changed when accounting for differences in technique. Suspect small RIGHT basilar pleural effusion. No pneumothorax. Bones demineralized. IMPRESSION: Persistent CHF and suspect small RIGHT pleural effusion, little changed. Electronically Signed   By: Ulyses Southward M.D.   On: 06/20/2015 16:18   I have personally reviewed and evaluated these images and lab results as part of my medical decision-making.   EKG Interpretation   Date/Time:  Tuesday June 20 2015 15:34:33 EST Ventricular Rate:  73 PR Interval:  207 QRS Duration: 92 QT Interval:  402 QTC Calculation: 443 R Axis:   46 Text Interpretation:   Sinus rhythm Atrial premature complexes Left  ventricular hypertrophy Baseline wander in lead(s) I II aVR V1 V2 no  STEMI. NO ACUTE CHANGE Confirmed by Donnald Garre, MD, Lebron Conners [phone]) on  06/20/2015 4:15:38 PM      MDM   Final diagnoses:  Acute on chronic congestive heart failure, unspecified congestive heart failure type (HCC)  Chronic obstructive pulmonary disease, unspecified COPD type [facility])   Patient presents with acute  onset of severe shortness of breath. She had recent hospitalization for COPD with mild CHF exacerbation. At this time findings are more suggestive of CHF and diuresis has been initiated with Lasix IV as well as BiPAP application and nitroglycerin. Patient is responding positively to these interventions. She does not wish intubation. Patient was experiencing anxiety on the BiPAP. She has been given 1 mg of Ativan and is tolerating it well. At this time she will be admitted for ongoing treatment.    Arby Barrette, MD 06/20/15 [phone]

## 2015-06-20 NOTE — Progress Notes (Signed)
RN called RT to patient room due to patient having increased work of breathing.  Per MD placed patient on Bipap and started patient on continuous nebulizer treatment.  Patient states that she feels much better with the bipap.  Patient is currently tolerating well at this time.  Will continue to monitor.

## 2015-06-20 NOTE — Progress Notes (Signed)
Patient transferred from ED to 2H without any complications. RT will continue to monitor. 

## 2015-06-20 NOTE — ED Notes (Signed)
Attempted report 

## 2015-06-20 NOTE — H&P (Signed)
[facility] History and Physical  Alexis [name] NWG:[id number] DOB: Jul 19, 1931 DOA: 06/20/2015  Referring physician: ED PCP: Elizabeth Palau, FNP  Specialists: Ortho  Chief Complaint:  SOB  82 y.o. ? chronic respiratory failure on home oxygen 2/2 to COPD,  diastolic dysfunction [prior nl Myoview EF 70%] paroxysmal atrial fibrillation, CHad2Vasc2 score=7 on coumadin Prior GI bleed 11/2014 HTN,  DM2 w/ neuropathy,  and bipolar ii  Admtited 06/11/15 c worsening dyspnea, wheezing, cough and increased fatigue of acute onset-the patient was was diuresed in the hospital and treated also for acute exacerbation of COPD with steroids and then transition back to her lower dose of Demadex on discharge.  Because she had acute kidney failure was encouraged that she follow up with primary care physician as an outpatient and patient was discharged on 06/15/15  Represented with acute shortness of breath BUN/creatinine down to 55/2.4 LFTs normal BNP 1716 WBC 10 Hemoglobin 10 Platelets 385 Chest x-ray concerning for ulnar edema little change when accounting for differences in technique and small right pleural effusion POC troponin negative   She is not completely alert and oriented and has been given Ativan in the emergency room and is on BiPAP and unable to give me much history History is obtained from patient's daughter who is at bedside who states patient wasn't normal state of health until this morning when she started feeling short of breath after her bath no specific reports of chest pain fever chills cough or other issues    Past Medical History  Diagnosis Date  . Hypertension   . Neuropathy (HCC)   . Anxiety   . PONV (postoperative nausea and vomiting)   . CHF (congestive heart failure) (HCC)   . COPD (chronic obstructive pulmonary disease) (HCC)     on home, nocturnal oxygen.   . Family history of adverse reaction to anesthesia   . Cardiomyopathy (HCC)   .  Diabetes mellitus without complication (HCC)     TYPE 2  . Macular degeneration, bilateral   . Varicose veins   . GIB (gastrointestinal bleeding)   . Paroxysmal atrial fibrillation (HCC)   . Shortness of breath dyspnea   . Acute on chronic diastolic heart failure (HCC) 12/22/2014   Past Surgical History  Procedure Laterality Date  . Hip fracture surgery  2004  . Abdominal hysterectomy    . Vein surgery    . Esophagogastroduodenoscopy N/A 12/01/2014    Procedure: ESOPHAGOGASTRODUODENOSCOPY (EGD);  Surgeon: Hilarie Fredrickson, MD;  Location: [facility] ENDOSCOPY;  Service: Endoscopy;  Laterality: N/A;   Social History:  Social History   Social History Narrative   Originally from [place]. She has always lived in [place] & had no travel. She has significant smoke exposure through her husband for 60 years. She has no pets currently. No prior bird or mold exposure. 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     Allergies  Allergen Reactions  . Yellow Dyes (Non-Tartrazine) Nausea And Vomiting    "deathly allergic" No other information given to explain reaction.  . Codeine Hives  . Other Other (See Comments)    novocaine broke mouth out  . Penicillins Hives  . Procaine Hives    Family History  Problem Relation Age of Onset  . Colon cancer Father 33    advanced when discovered, no surgery or therapy.  this led to his death  .  Breast cancer Sister   . Cancer Brother   . Breast cancer Mother   . Lung cancer Brother   . Thyroid disease Neg Hx     Prior to Admission medications   Medication Sig Start Date End Date Taking? Authorizing Provider  albuterol (PROVENTIL HFA;VENTOLIN HFA) 108 (90 BASE) MCG/ACT inhaler Inhale 2 puffs into the lungs every 6 (six) hours as needed for wheezing or shortness of breath. 06/11/13  Yes Nishant Dhungel, MD  aspirin EC 81 MG tablet  Take 81 mg by mouth every 6 (six) hours as needed for moderate pain.   Yes Historical Provider, MD  atorvastatin (LIPITOR) 20 MG tablet Take 20 mg by mouth at bedtime. 10/12/14 10/12/15 Yes Historical Provider, MD  cholecalciferol (VITAMIN D) 1000 UNITS tablet Take 1,000 Units by mouth every morning.    Yes Historical Provider, MD  escitalopram (LEXAPRO) 10 MG tablet Take 10 mg by mouth daily.   Yes Historical Provider, MD  fenofibrate 160 MG tablet Take 160 mg by mouth daily.   Yes Historical Provider, MD  gabapentin (NEURONTIN) 100 MG capsule Take 1 capsule (100 mg total) by mouth daily. 06/15/15  Yes Rhetta Mura, MD  glipiZIDE (GLUCOTROL XL) 5 MG 24 hr tablet Take 5 mg by mouth daily with breakfast.   Yes Historical Provider, MD  hydrALAZINE (APRESOLINE) 25 MG tablet TAKE 1 TABLET (25 MG TOTAL) BY MOUTH 2 (TWO) TIMES DAILY. 01/31/15  Yes Chrystie Nose, MD  isosorbide mononitrate (IMDUR) 30 MG 24 hr tablet TAKE 1 TABLET BY MOUTH EVERY DAY 01/31/15  Yes Chrystie Nose, MD  methimazole (TAPAZOLE) 5 MG tablet Take 1 tablet (5 mg total) by mouth 3 (three) times a week. Patient taking differently: Take 5 mg by mouth daily.  01/23/15  Yes Romero Belling, MD  metoprolol tartrate (LOPRESSOR) 25 MG tablet TAKE 1 TABLET BY MOUTH TWICE A DAY 02/27/15  Yes Chrystie Nose, MD  Multiple Vitamin (MULTIVITAMIN WITH MINERALS) TABS tablet Take 1 tablet by mouth daily.   Yes Historical Provider, MD  OXYGEN Inhale 3 L into the lungs at bedtime.    Yes Historical Provider, MD  potassium chloride SA (K-DUR,KLOR-CON) 20 MEQ tablet Take 1 tablet (20 mEq total) by mouth daily. 12/25/14  Yes Lora Paula, MD  predniSONE (DELTASONE) 20 MG tablet Take 2 tablets (40 mg total) by mouth daily with breakfast. 12/25/14  Yes Lora Paula, MD  torsemide (DEMADEX) 20 MG tablet Take 1 tablet (20 mg total) by mouth 2 (two) times daily. 06/15/15  Yes Rhetta Mura, MD  warfarin (COUMADIN) 1 MG tablet Take 1 tablet (1 mg  total) by mouth daily. 06/17/15   Rhetta Mura, MD   Physical Exam: Filed Vitals:   06/20/15 1630 06/20/15 1645 06/20/15 1700 06/20/15 1715  BP: 209/84 194/109 190/64 154/55  Pulse: 87 93 88 89  Resp: 26 28 23 18   SpO2: 94% 93% 95% 95%   Tired-appearing Caucasian female no apparent distress but sleepy EOMI NCAT JVD elevated at bedside 5 cm Chest clinically clear S1-S2 tachycardic Abdomen distended no rebound no guarding No lower extremity edema however left toe shows swelling and darkness with some purulence  Labs on Admission:  Basic Metabolic Panel:  Recent Labs Lab 06/14/15 0339 06/15/15 0235 06/20/15 1630  NA 136 139 145  K 4.3 4.2 3.9  CL 98* 102 103  CO2 27 29 27   GLUCOSE 141* 223* 106*  BUN 79* 84* 55*  CREATININE 3.62* 3.23* 2.49*  CALCIUM  8.8* 8.6* 9.7   Liver Function Tests:  Recent Labs Lab 06/20/15 1630  AST 27  ALT 19  ALKPHOS 62  BILITOT 0.6  PROT 7.3  ALBUMIN 3.7   No results for input(s): LIPASE, AMYLASE in the last 168 hours. No results for input(s): AMMONIA in the last 168 hours. CBC:  Recent Labs Lab 06/14/15 0339 06/15/15 0235 06/20/15 1630  WBC 8.5 5.4 10.5  NEUTROABS  --   --  8.2*  HGB 8.1* 7.4* 10.0*  HCT 26.3* 24.3* 33.9*  MCV 85.1 85.6 87.8  PLT 247 206 385   Cardiac Enzymes:  Recent Labs Lab 06/20/15 1630  TROPONINI <0.03    BNP (last 3 results)  Recent Labs  12/21/14 0745 06/11/15 1755 06/20/15 1630  BNP 557.5* 914.1* 1716.4*    ProBNP (last 3 results) No results for input(s): PROBNP in the last 8760 hours.  CBG:  Recent Labs Lab 06/14/15 1138 06/14/15 1617 06/14/15 2157 06/15/15 0614 06/15/15 1121  GLUCAP 243* 167* 190* 110* 213*    Radiological Exams on Admission: Dg Chest Port 1 View  06/20/2015  CLINICAL DATA:  Respiratory distress, shortness of breath, fluid overload, history CHF, asthma, COPD, diabetes mellitus, cardiomyopathy, hypertension, paroxysmal atrial fibrillation EXAM:  PORTABLE CHEST 1 VIEW COMPARISON:  Portable exam 1540 hours compared to 06/13/2015 FINDINGS: Enlargement of cardiac silhouette with pulmonary vascular congestion. Interstitial infiltrates bilaterally compatible with pulmonary edema and CHF, little changed when accounting for differences in technique. Suspect small RIGHT basilar pleural effusion. No pneumothorax. Bones demineralized. IMPRESSION: Persistent CHF and suspect small RIGHT pleural effusion, little changed. Electronically Signed   By: Ulyses Southward M.D.   On: 06/20/2015 16:18      Assessment/Plan  Acute respiratory failure ABG on 70% with BiPAP shows pH 7.3, PCO2 52, PaO2 [address]*her therapist to increase oxygenation to 80% and we will reassess with another blood gas in about half an hour. I suspect she will benefit from aggressive diuresis and continued BiPAP overnight.  At this hospital visit she may benefit also from in detail goals discussion she has voiced in the past she does not want dialysis and was undecided on last admission regarding planning going forward   Probable acute exacerbation of heart failure echo performed on last admission 1/16 showed EF 55-60% with PA peak pressure 50 mmHg We will continue Lasix at a dose of 80 mg 3 times a day IV Her weight on last discharge was pulse 67 and she is below this so we will need to cautiously adjust this Given acute exacerbation of her Estrace status we will cut back her metoprolol from 25-12.5 twice a day I'll place when necessary orders for IV hydralazine as well She can also take Imdur 30 mg if she is awake enough For now I feel it is reasonable to continue nitroglycerin drip that was started in the emergency room   Hypertensive urgency Blood pressures on admission were 190s systolic and patient was started on nitroglycerin which is controlling her blood pressure at a GTT rate of 10 cc per hour  Acute exacerbation COPD  we will give IV Solu-Medrol 40 mg IV every 8 Nebulized  every 4 hourly albuterol scheduled Monitor  Possible osteomyelitis of left great toe versus cellulitis I would consider orthopedic consult in the morning--I have not called anyone today We will start Zosyn for cellulitis coverage of the lower extremity Review the wound clinically in a.m.  Atrial fibrillation [place] score 7 Continue metoprolol at lower dose 12.5 twice  a day Continue Coumadin when able and will bridge with heparin given elevated [place] score I'm not sure that patient has been taking her medications as her INR is 1.2  Kidney disease stage XLV Long discussion last visit to the hospital Patient does not wish to consider dialysis We will need to discuss this going forward  Hyperthyroidism Continue methimazole when able  Diabetes mellitus type 2 well controlled A1c 6.3 last admission Monitor every 4 hourly CBG checks Place on D5 50 cc per hour and close monitoring for hypoglycemia   Patient is critically ill and needs stepdown management. We will only order essential meds such as methimazole Imdur metoprolol Lexapro and hold all others She will need close monitoring but she is a DO NOT RESUSCITATE and I discussed this with her daughter

## 2015-06-21 ENCOUNTER — Inpatient Hospital Stay (HOSPITAL_COMMUNITY): Payer: Medicare Other

## 2015-06-21 DIAGNOSIS — I5033 Acute on chronic diastolic (congestive) heart failure: Secondary | ICD-10-CM

## 2015-06-21 DIAGNOSIS — J9601 Acute respiratory failure with hypoxia: Secondary | ICD-10-CM

## 2015-06-21 LAB — COMPREHENSIVE METABOLIC PANEL
ALBUMIN: 2.7 g/dL — AB (ref 3.5–5.0)
ALT: 14 U/L (ref 14–54)
AST: 19 U/L (ref 15–41)
Alkaline Phosphatase: 41 U/L (ref 38–126)
Anion gap: 18 — ABNORMAL HIGH (ref 5–15)
BILIRUBIN TOTAL: 0.7 mg/dL (ref 0.3–1.2)
BUN: 49 mg/dL — AB (ref 6–20)
CHLORIDE: 98 mmol/L — AB (ref 101–111)
CO2: 26 mmol/L (ref 22–32)
CREATININE: 2.47 mg/dL — AB (ref 0.44–1.00)
Calcium: 8.3 mg/dL — ABNORMAL LOW (ref 8.9–10.3)
GFR calc Af Amer: 20 mL/min — ABNORMAL LOW (ref >60)
GFR, EST NON AFRICAN AMERICAN: 17 mL/min — AB (ref >60)
GLUCOSE: 233 mg/dL — AB (ref 65–99)
Potassium: 3.9 mmol/L (ref 3.5–5.1)
SODIUM: 142 mmol/L (ref 135–145)
TOTAL PROTEIN: 5.5 g/dL — AB (ref 6.5–8.1)

## 2015-06-21 LAB — GLUCOSE, CAPILLARY
GLUCOSE-CAPILLARY: 157 mg/dL — AB (ref 65–99)
GLUCOSE-CAPILLARY: 176 mg/dL — AB (ref 65–99)
GLUCOSE-CAPILLARY: 192 mg/dL — AB (ref 65–99)
Glucose-Capillary: 223 mg/dL — ABNORMAL HIGH (ref 65–99)
Glucose-Capillary: 144 mg/dL — ABNORMAL HIGH (ref 65–99)
Glucose-Capillary: 156 mg/dL — ABNORMAL HIGH (ref 65–99)

## 2015-06-21 LAB — HEPARIN LEVEL (UNFRACTIONATED)
HEPARIN UNFRACTIONATED: 0.18 [IU]/mL — AB (ref 0.30–0.70)
HEPARIN UNFRACTIONATED: 0.41 [IU]/mL (ref 0.30–0.70)

## 2015-06-21 LAB — CBC
HEMATOCRIT: 26.1 % — AB (ref 36.0–46.0)
Hemoglobin: 7.9 g/dL — ABNORMAL LOW (ref 12.0–15.0)
MCH: 26.3 pg (ref 26.0–34.0)
MCHC: 30.3 g/dL (ref 30.0–36.0)
MCV: 87 fL (ref 78.0–100.0)
Platelets: 292 10*3/uL (ref 150–400)
RBC: 3 MIL/uL — ABNORMAL LOW (ref 3.87–5.11)
RDW: 18.4 % — AB (ref 11.5–15.5)
WBC: 7 10*3/uL (ref 4.0–10.5)

## 2015-06-21 LAB — PROTIME-INR
INR: 1.42 (ref 0.00–1.49)
INR: 1.4 (ref 0.00–1.49)
PROTHROMBIN TIME: 17.3 s — AB (ref 11.6–15.2)
Prothrombin Time: 17.5 s — ABNORMAL HIGH (ref 11.6–15.2)

## 2015-06-21 MED ORDER — DEXTROSE-NACL 5-0.45 % IV SOLN
INTRAVENOUS | Status: DC
  Administered 2015-06-21 – 2015-06-22 (×2): via INTRAVENOUS

## 2015-06-21 MED ORDER — HYDRALAZINE HCL 50 MG PO TABS
50.0000 mg | ORAL_TABLET | Freq: Two times a day (BID) | ORAL | Status: DC
  Administered 2015-06-21 – 2015-06-23 (×5): 50 mg via ORAL
  Filled 2015-06-21 (×5): qty 1

## 2015-06-21 MED ORDER — INSULIN ASPART 100 UNIT/ML ~~LOC~~ SOLN
0.0000 [IU] | Freq: Three times a day (TID) | SUBCUTANEOUS | Status: DC
  Administered 2015-06-21: 1 [IU] via SUBCUTANEOUS
  Administered 2015-06-22: 3 [IU] via SUBCUTANEOUS
  Administered 2015-06-22: 1 [IU] via SUBCUTANEOUS
  Administered 2015-06-22: 3 [IU] via SUBCUTANEOUS
  Administered 2015-06-23: 2 [IU] via SUBCUTANEOUS

## 2015-06-21 MED ORDER — GABAPENTIN 100 MG PO CAPS
100.0000 mg | ORAL_CAPSULE | Freq: Every day | ORAL | Status: DC
  Administered 2015-06-21 – 2015-06-22 (×2): 100 mg via ORAL
  Filled 2015-06-21 (×2): qty 1

## 2015-06-21 MED ORDER — WARFARIN SODIUM 1 MG PO TABS
1.0000 mg | ORAL_TABLET | Freq: Once | ORAL | Status: AC
  Administered 2015-06-21: 1 mg via ORAL
  Filled 2015-06-21 (×2): qty 0.5

## 2015-06-21 MED ORDER — FUROSEMIDE 10 MG/ML IJ SOLN
20.0000 mg | Freq: Two times a day (BID) | INTRAMUSCULAR | Status: DC
  Administered 2015-06-21 – 2015-06-23 (×5): 20 mg via INTRAVENOUS
  Filled 2015-06-21 (×5): qty 2

## 2015-06-21 MED ORDER — METHIMAZOLE 5 MG PO TABS
5.0000 mg | ORAL_TABLET | Freq: Every day | ORAL | Status: DC
  Administered 2015-06-22 – 2015-06-23 (×2): 5 mg via ORAL
  Filled 2015-06-21 (×2): qty 1

## 2015-06-21 NOTE — Progress Notes (Signed)
Orthopedic Tech Progress Note Patient Details:  Alexis [name] 1932-03-07 [id number]  Ortho Devices Type of Ortho Device: Postop shoe/boot Ortho Device/Splint Location: lle Ortho Device/Splint Interventions: Application   Makahla Kiser 06/21/2015, 2:02 PM

## 2015-06-21 NOTE — Consult Note (Signed)
ORTHOPAEDIC CONSULTATION  REQUESTING PHYSICIAN: Oswald Hillock, MD  Chief Complaint: L great toe fracture  HPI: Alexis [name] is a 82 y.o. female who complains of L foot pain.  Imaging revealed L great toe extra-articular fracture.  Patient cannot recall how the toe was injured, but remembers having pain in her toe for several weeks.  She thinks she may have possibly dropped something on the toe.  She has decreased sensation to the L foot due to DM neuropathy.   Past Medical History  Diagnosis Date  . Hypertension   . Neuropathy ([place])   . Anxiety   . PONV (postoperative nausea and vomiting)   . CHF (congestive heart failure) ([place])   . COPD (chronic obstructive pulmonary disease) (HCC)     on home, nocturnal oxygen.   . Family history of adverse reaction to anesthesia   . Cardiomyopathy ([place])   . Diabetes mellitus without complication ([place])     TYPE 2  . Macular degeneration, bilateral   . Varicose veins   . GIB (gastrointestinal bleeding)   . Paroxysmal atrial fibrillation (HCC)   . Shortness of breath dyspnea   . Acute on chronic diastolic heart failure ([place]) 12/22/2014   Past Surgical History  Procedure Laterality Date  . Hip fracture surgery  2004  . Abdominal hysterectomy    . Vein surgery    . Esophagogastroduodenoscopy N/A 12/01/2014    Procedure: ESOPHAGOGASTRODUODENOSCOPY (EGD);  Surgeon: Irene Shipper, MD;  Location: [facility] ENDOSCOPY;  Service: Endoscopy;  Laterality: N/A;   Social History   Social History  . Marital Status: Widowed    Spouse Name: N/A  . Number of Children: Y  . Years of Education: N/A   Occupational History  . retired    Social History Main Topics  . Smoking status: Passive Smoke Exposure - Never Smoker  . Smokeless tobacco: Never Used  . Alcohol Use: No  . Drug Use: No  . Sexual Activity: Not Asked   Other Topics Concern  . None   Social History Narrative   Originally from [place]. She has always lived in [place] & had no travel. She  has significant smoke exposure through her husband for 60 years. She has no pets currently. No prior bird or mold exposure. She did work outside of the home [employer] & medical equipments. 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    Family History  Problem Relation Age of Onset  . Colon cancer Father 56    advanced when discovered, no surgery or therapy.  this led to his death  . Breast cancer Sister   . Cancer Brother   . Breast cancer Mother   . Lung cancer Brother   . Thyroid disease Neg Hx    Allergies  Allergen Reactions  . Yellow Dyes (Non-Tartrazine) Nausea And Vomiting    "deathly allergic" No other information given to explain reaction.  . Codeine Hives  . Other Other (See Comments)    novocaine broke mouth out  . Penicillins Hives  . Procaine Hives   Prior to Admission medications   Medication Sig Start Date End Date Taking? Authorizing Provider  albuterol (PROVENTIL HFA;VENTOLIN HFA) 108 (90 BASE) MCG/ACT inhaler Inhale 2 puffs into the lungs every 6 (six) hours as needed for wheezing or shortness of breath. 06/11/13  Yes Nishant Dhungel, MD  aspirin EC 81 MG tablet Take 81 mg by mouth  every 6 (six) hours as needed for moderate pain.   Yes Historical Provider, MD  atorvastatin (LIPITOR) 20 MG tablet Take 20 mg by mouth at bedtime. 10/12/14 10/12/15 Yes Historical Provider, MD  cholecalciferol (VITAMIN D) 1000 UNITS tablet Take 1,000 Units by mouth every morning.    Yes Historical Provider, MD  escitalopram (LEXAPRO) 10 MG tablet Take 10 mg by mouth daily.   Yes Historical Provider, MD  fenofibrate 160 MG tablet Take 160 mg by mouth daily.   Yes Historical Provider, MD  gabapentin (NEURONTIN) 100 MG capsule Take 1 capsule (100 mg total) by mouth daily. 06/15/15  Yes Nita Sells, MD  glipiZIDE (GLUCOTROL XL) 5 MG 24 hr tablet Take 5 mg by mouth daily with breakfast.   Yes Historical  Provider, MD  hydrALAZINE (APRESOLINE) 25 MG tablet TAKE 1 TABLET (25 MG TOTAL) BY MOUTH 2 (TWO) TIMES DAILY. 01/31/15  Yes Pixie Casino, MD  isosorbide mononitrate (IMDUR) 30 MG 24 hr tablet TAKE 1 TABLET BY MOUTH EVERY DAY 01/31/15  Yes Pixie Casino, MD  methimazole (TAPAZOLE) 5 MG tablet Take 1 tablet (5 mg total) by mouth 3 (three) times a week. Patient taking differently: Take 5 mg by mouth daily.  01/23/15  Yes Renato Shin, MD  metoprolol tartrate (LOPRESSOR) 25 MG tablet TAKE 1 TABLET BY MOUTH TWICE A DAY 02/27/15  Yes Pixie Casino, MD  Multiple Vitamin (MULTIVITAMIN WITH MINERALS) TABS tablet Take 1 tablet by mouth daily.   Yes Historical Provider, MD  OXYGEN Inhale 3 L into the lungs at bedtime.    Yes Historical Provider, MD  potassium chloride SA (K-DUR,KLOR-CON) 20 MEQ tablet Take 1 tablet (20 mEq total) by mouth daily. 12/25/14  Yes Milagros Loll, MD  predniSONE (DELTASONE) 20 MG tablet Take 2 tablets (40 mg total) by mouth daily with breakfast. 12/25/14  Yes Milagros Loll, MD  torsemide (DEMADEX) 20 MG tablet Take 1 tablet (20 mg total) by mouth 2 (two) times daily. 06/15/15  Yes Nita Sells, MD  warfarin (COUMADIN) 1 MG tablet Take 1 tablet (1 mg total) by mouth daily. 06/17/15   Nita Sells, MD   X-ray Chest Pa And Lateral  06/21/2015  CLINICAL DATA:  Follow-up pneumonia.  Subsequent encounter. EXAM: CHEST  2 VIEW COMPARISON:  Chest radiograph performed 06/20/2015 FINDINGS: Small bilateral pleural effusions are seen. Patchy bilateral airspace opacification likely reflects worsening pulmonary edema, though pneumonia cannot be excluded given clinical concern. Underlying vascular congestion is seen. No pneumothorax identified. The cardiomediastinal silhouette is enlarged. No acute osseous abnormalities identified. IMPRESSION: Small bilateral pleural effusions noted. Patchy bilateral airspace opacification likely reflects worsening pulmonary edema, though pneumonia  cannot be excluded given clinical concern. Underlying vascular congestion and cardiomegaly. Electronically Signed   By: Garald Balding M.D.   On: 06/21/2015 00:35   Dg Chest Port 1 View  06/20/2015  CLINICAL DATA:  Respiratory distress, shortness of breath, fluid overload, history CHF, asthma, COPD, diabetes mellitus, cardiomyopathy, hypertension, paroxysmal atrial fibrillation EXAM: PORTABLE CHEST 1 VIEW COMPARISON:  Portable exam 1540 hours compared to 06/13/2015 FINDINGS: Enlargement of cardiac silhouette with pulmonary vascular congestion. Interstitial infiltrates bilaterally compatible with pulmonary edema and CHF, little changed when accounting for differences in technique. Suspect small RIGHT basilar pleural effusion. No pneumothorax. Bones demineralized. IMPRESSION: Persistent CHF and suspect small RIGHT pleural effusion, little changed. Electronically Signed   By: Lavonia Dana M.D.   On: 06/20/2015 16:18   Dg Foot Complete Left  06/21/2015  CLINICAL  DATA:  82 year old female with injury to the left foot and big toe with soft tissue wound. Diabetic. Initial encounter. EXAM: LEFT FOOT - COMPLETE 3+ VIEW COMPARISON:  None. FINDINGS: Sip dressing material about the left great toe. There is a mildly impacted but otherwise nondisplaced transverse fracture through the left big toe distal phalanx affecting the base, extra-articular. Great toe IP joint appears normal. First proximal phalanx and metatarsal are intact. MT P and IP joint subluxation elsewhere in the left foot. Probable healed fractures of the fourth and fifth proximal phalanges. No other acute fracture or dislocation identified. Pes planus. Small chronic appearing ossific fragments at the medial navicular and anterior inferior calcaneus. The calcaneus appears intact. No subcutaneous gas. IMPRESSION: Nondisplaced extra-articular fracture through the base of the left great toe distal phalanx. No other acute osseous abnormality identified.  Electronically Signed   By: Genevie Ann M.D.   On: 06/21/2015 10:25    Positive ROS: All other systems have been reviewed and were otherwise negative with the exception of those mentioned in the HPI and as above.  Labs cbc  Recent Labs  06/20/15 1630 06/21/15 0420  WBC 10.5 7.0  HGB 10.0* 7.9*  HCT 33.9* 26.1*  PLT 385 292    Labs inflam No results for input(s): CRP in the last 72 hours.  Invalid input(s): ESR  Labs coag  Recent Labs  06/21/15 0420 06/21/15 1318  INR 1.42 1.40     Recent Labs  06/20/15 1630 06/21/15 0337  NA 145 142  K 3.9 3.9  CL 103 98*  CO2 27 26  GLUCOSE 106* 233*  BUN 55* 49*  CREATININE 2.49* 2.47*  CALCIUM 9.7 8.3*    Physical Exam: Filed Vitals:   06/21/15 1700 06/21/15 1800  BP: 160/54 133/41  Pulse: 57 60  Temp: 98.8 F (37.1 C)   Resp: 21 24   General: Alert, no acute distress Cardiovascular: No pedal edema Respiratory: No cyanosis, no use of accessory musculature GI: No organomegaly, abdomen is soft and non-tender Skin: No lesions in the area of chief complaint other than those listed below in MSK exam.  Neurologic: Sensation intact distally Psychiatric: Patient is competent for consent with normal mood and affect Lymphatic: No axillary or cervical lymphadenopathy  MUSCULOSKELETAL:  L great toe has healing laceration to the nail fold.  No signs of infection.  No active drainage. Ecchymosis and swelling throughout the toe.  Intact ROM of the toes.  Sensation diminished throughout the foot due to DM nueropathy.   Other extremities are atraumatic with painless ROM and NVI.  Assessment: L great toe extra-articular fracture  Plan: Fracture is stable.  No indication for surgical intervention.  Will continue to follow as outpatient.  Will be WBAT in the LLE in the post op shoe.  Will need to keep a close eye on the laceration around the nail fold, as the patient has DM and has poor sensation due to neuropathy.  Dry dressings  have been applied for now.   [facility] [phone]   06/21/2015 6:38 PM

## 2015-06-21 NOTE — Care Management Note (Addendum)
Case Management Note  Patient Details  Name: Alexis [name] MRN: [id number] Date of Birth: Aug 20, 1931  Subjective/Objective:        Adm w resp distress, chf            Action/Plan: lives w son, pcp dr Dareen Piano   Expected Discharge Date:                  Expected Discharge Plan:     In-House Referral:     Discharge planning Services     Post Acute Care Choice:    Choice offered to:     DME Arranged:    DME Agency:     HH Arranged:    HH Agency:     Status of Service:     Medicare Important Message Given:    Date Medicare IM Given:    Medicare IM give by:    Date Additional Medicare IM Given:    Additional Medicare Important Message give by:     If discussed at Long Length of Stay Meetings, dates discussed:    Additional Comments: ur review done, act w [facility], chart states has rolator and home o2  Hanley Hays, RN 06/21/2015, 7:59 AM

## 2015-06-21 NOTE — Progress Notes (Signed)
Inpatient Diabetes Program Recommendations  AACE/ADA: New Consensus Statement on Inpatient Glycemic Control (2015)  Target Ranges:  Prepandial:   less than 140 mg/dL      Peak postprandial:   less than 180 mg/dL (1-2 hours)      Critically ill patients:  140 - 180 mg/dL   Review of Glycemic Control  Inpatient Diabetes Program Recommendations:  Correction (SSI): add Novolog sensitive scale  Thank you  Piedad Climes BSN, RN,CDE Inpatient Diabetes Coordinator [phone] (team pager)

## 2015-06-21 NOTE — Progress Notes (Signed)
ANTICOAGULATION CONSULT NOTE - Follow Up Consult  Pharmacy Consult for Heparin (while INR is sub-therapeutic) Indication: atrial fibrillation  Allergies  Allergen Reactions  . Yellow Dyes (Non-Tartrazine) Nausea And Vomiting    "deathly allergic" No other information given to explain reaction.  . Codeine Hives  . Other Other (See Comments)    novocaine broke mouth out  . Penicillins Hives  . Procaine Hives   Patient Measurements: Height: 5\' 2"  (157.5 cm) Weight: 141 lb 8.6 oz (64.2 kg) IBW/kg (Calculated) : 50.1  Vital Signs: Temp: 99.6 F (37.6 C) (01/25 1147) Temp Source: Oral (01/25 1147) BP: 161/46 mmHg (01/25 1500) Pulse Rate: 62 (01/25 1500)  Labs:  Recent Labs  06/20/15 1630 06/21/15 0337 06/21/15 0420 06/21/15 0500 06/21/15 1318  HGB 10.0*  --  7.9*  --   --   HCT 33.9*  --  26.1*  --   --   PLT 385  --  292  --   --   LABPROT 15.8*  --  17.5*  --  17.3*  INR 1.25  --  1.42  --  1.40  HEPARINUNFRC  --   --   --  0.18* 0.41  CREATININE 2.49* 2.47*  --   --   --   TROPONINI <0.03  --   --   --   --     Estimated Creatinine Clearance: 15.7 mL/min (by C-G formula based on Cr of 2.47).  Assessment: On heparin for afib while INR is subtheraputic. INR 1.4 today.  Heparin drip rate 950uts/hr  heparin level 0.41 at goal.  H/H dropped over night heparin now off   Goal of Therapy:  INR 2-3 Heparin level 0.3-0.7 units/ml Monitor platelets by anticoagulation protocol: Yes   Plan:  Hold heparin for now and check guiac stool F/u CBC Will continue low dose warfarin tonight - confirmed with MD   Leota Sauers Pharm.D. CPP, BCPS Clinical Pharmacist [phone] 06/21/2015 4:02 PM

## 2015-06-21 NOTE — Progress Notes (Signed)
[facility]  Patient Status: Active (receiving services up to time of hospitalization)  [facility] is providing the following services: RN and PT  If patient discharges after hours, please call [phone].   Alexis [name] 06/21/2015, 5:48 PM

## 2015-06-21 NOTE — Progress Notes (Signed)
MD made aware about  the  result  Of the x-ray of the left foot.

## 2015-06-21 NOTE — Progress Notes (Signed)
ANTICOAGULATION CONSULT NOTE - Follow Up Consult  Pharmacy Consult for Heparin (while INR is sub-therapeutic) Indication: atrial fibrillation  Allergies  Allergen Reactions  . Yellow Dyes (Non-Tartrazine) Nausea And Vomiting    "deathly allergic" No other information given to explain reaction.  . Codeine Hives  . Other Other (See Comments)    novocaine broke mouth out  . Penicillins Hives  . Procaine Hives   Patient Measurements: Height: 5\' 2"  (157.5 cm) Weight: 141 lb 8.6 oz (64.2 kg) IBW/kg (Calculated) : 50.1  Vital Signs: Temp: 98.5 F (36.9 C) (01/25 0327) Temp Source: Oral (01/25 0327) BP: 139/49 mmHg (01/25 0400) Pulse Rate: 69 (01/25 0400)  Labs:  Recent Labs  06/20/15 1630 06/21/15 0420 06/21/15 0500  HGB 10.0* 7.9*  --   HCT 33.9* 26.1*  --   PLT 385 292  --   LABPROT 15.8* 17.5*  --   INR 1.25 1.42  --   HEPARINUNFRC  --   --  0.18*  CREATININE 2.49*  --   --   TROPONINI <0.03  --   --     Estimated Creatinine Clearance: 15.6 mL/min (by C-G formula based on Cr of 2.49).  Assessment: On heparin for afib while INR is low, initial heparin level this AM is sub-therapeutic, no issues per RN.   Goal of Therapy:  Heparin level 0.3-0.7 units/ml Monitor platelets by anticoagulation protocol: Yes   Plan:  -Increase heparin to 950 units/hr -1300 HL  Juley Giovanetti 06/21/2015,5:15 AM

## 2015-06-21 NOTE — Progress Notes (Signed)
[facility] PROGRESS NOTE  Alexis [name] VZD:[id number] DOB: [birth year] DOA: 06/20/2015 PCP: Elizabeth Palau, FNP  Assessment/Plan: 1. Acute hypoxic respiratory failure- secondary to diastolic heart failure. Patient improved with one dose of Lasix given yesterday. We'll start Lasix 20 mg IV every 12 hours. Continue oxygen via nasal cannula. BiPAP when necessary. 2. Acute on chronic kidney disease stage IV- patient presented with BUN/creatinine of 55/2.49, today mildly improved to 49/2.47 after diuresis with Lasix. Will continue with IV Lasix, follow BMP in a.m. 3. Nondisplaced extra-articular fracture left great toe- x-ray of the foot showed nondisplaced extra-articular fracture through the base of the left great toe distal phalanx. Orthopedic consultation has been obtained. Patient will be put on postop shoe as per orthopedics recommendation. 4. Hypertensive urgency- patient was started on IV nitroglycerin for hypertensive urgency, blood pressure has improved at this time. Will discontinue nitroglycerin drip and start hydralazine 50 mg every 12 hours. 5. Diabetes mellitus- hold oral hypoglycemic agents, start sliding scale insulin with NovoLog. 6. Paroxysmal atrial fibrillation- Chad2vasc score is 7, continue Coumadin. INR is 1.42 Continue metoprolol 25 mg by mouth twice a day. Patient on so on IV heparin for bridging for Coumadin. Will discontinue heparin as patient hemoglobin has dropped to 7.9. 7. Hyperthyroidism- continue Synthroid 8. Anemia- today patient's hemoglobin is 7.9, dropped from 10.0 yesterday. She has a history of GI bleed in the past. Will discontinue heparin at this time. Check stool for occult blood.  Code Status: DO NOT RESUSCITATE Family Communication: No family at bedside Disposition Plan: Pending improvement in respiratory status   Consultants:  None  Procedures:  None  Antibiotics:  None  HPI/Subjective: 82 year old female with a history of  chronic respiratory failure on home oxygen due to COPD, diastolic heart failure, paroxysmal atrial fibrillation on Coumadin diabetes mellitus who was discharged on 06/15/2015 after patient was treated for CHF and COPD exacerbation. Patient returned to the emergency room with worsening shortness of breath and worsening of renal function with creatinine 2.49 Chest x-ray showed pulmonary edema, BNP was elevated to 1716. Patient was given 1 dose of Lasix 40 mg IV 1, had a good diuresis with total output 2300. Today creatinine improved to 2.47.  Patient was on BiPAP last night, currently not requiring BiPAP and is on 3 L oxygen via nasal cannula.   Objective: Filed Vitals:   06/21/15 1147 06/21/15 1200  BP:  146/49  Pulse: 61 59  Temp: 99.6 F (37.6 C)   Resp: 20 15    Intake/Output Summary (Last 24 hours) at 06/21/15 1343 Last data filed at 06/21/15 1200  Gross per 24 hour  Intake 783.95 ml  Output   2300 ml  Net -1516.05 ml   Filed Weights   06/20/15 1930  Weight: 64.2 kg (141 lb 8.6 oz)    Exam:   General:  Appears in no acute distress  Cardiovascular: S1-S2 regular  Respiratory: Bibasilar crackles  Abdomen: Soft nontender no organomegaly  Musculoskeletal: Ecchymosis noted on the left great toe   Data Reviewed: Basic Metabolic Panel:  Recent Labs Lab 06/15/15 0235 06/20/15 1630 06/21/15 0337  NA 139 145 142  K 4.2 3.9 3.9  CL 102 103 98*  CO2 29 27 26   GLUCOSE 223* 106* 233*  BUN 84* 55* 49*  CREATININE 3.23* 2.49* 2.47*  CALCIUM 8.6* 9.7 8.3*   Liver Function Tests:  Recent Labs Lab 06/20/15 1630 06/21/15 0337  AST 27 19  ALT 19 14  ALKPHOS 62 41  BILITOT 0.6 0.7  PROT 7.3 5.5*  ALBUMIN 3.7 2.7*   No results for input(s): LIPASE, AMYLASE in the last 168 hours. No results for input(s): AMMONIA in the last 168 hours. CBC:  Recent Labs Lab 06/15/15 0235 06/20/15 1630 06/21/15 0420  WBC 5.4 10.5 7.0  NEUTROABS  --  8.2*  --   HGB 7.4*  10.0* 7.9*  HCT 24.3* 33.9* 26.1*  MCV 85.6 87.8 87.0  PLT 206 385 292   Cardiac Enzymes:  Recent Labs Lab 06/20/15 1630  TROPONINI <0.03   BNP (last 3 results)  Recent Labs  12/21/14 0745 06/11/15 1755 06/20/15 1630  BNP 557.5* 914.1* 1716.4*    ProBNP (last 3 results) No results for input(s): PROBNP in the last 8760 hours.  CBG:  Recent Labs Lab 06/20/15 2139 06/20/15 2335 06/21/15 0331 06/21/15 0832 06/21/15 1150  GLUCAP 146* 176* 223* 157* 192*    Recent Results (from the past 240 hour(s))  MRSA PCR Screening     Status: None   Collection Time: 06/11/15 11:20 PM  Result Value Ref Range Status   MRSA by PCR NEGATIVE NEGATIVE Final    Comment:        The GeneXpert MRSA Assay (FDA approved for NASAL specimens only), is one component of a comprehensive MRSA colonization surveillance program. It is not intended to diagnose MRSA infection nor to guide or monitor treatment for MRSA infections.   Culture, blood (routine x 2)     Status: None (Preliminary result)   Collection Time: 06/20/15  4:20 PM  Result Value Ref Range Status   Specimen Description BLOOD RIGHT ANTECUBITAL  Final   Special Requests BOTTLES DRAWN AEROBIC AND ANAEROBIC 10CC  Final   Culture NO GROWTH < 24 HOURS  Final   Report Status PENDING  Incomplete  Culture, blood (routine x 2)     Status: None (Preliminary result)   Collection Time: 06/20/15  4:30 PM  Result Value Ref Range Status   Specimen Description BLOOD LEFT ARM  Final   Special Requests BOTTLES DRAWN AEROBIC AND ANAEROBIC 10CC  Final   Culture NO GROWTH < 24 HOURS  Final   Report Status PENDING  Incomplete  MRSA PCR Screening     Status: None   Collection Time: 06/20/15  8:02 PM  Result Value Ref Range Status   MRSA by PCR NEGATIVE NEGATIVE Final    Comment:        The GeneXpert MRSA Assay (FDA approved for NASAL specimens only), is one component of a comprehensive MRSA colonization surveillance program. It is  not intended to diagnose MRSA infection nor to guide or monitor treatment for MRSA infections.      Studies: X-ray Chest Pa And Lateral  06/21/2015  CLINICAL DATA:  Follow-up pneumonia.  Subsequent encounter. EXAM: CHEST  2 VIEW COMPARISON:  Chest radiograph performed 06/20/2015 FINDINGS: Small bilateral pleural effusions are seen. Patchy bilateral airspace opacification likely reflects worsening pulmonary edema, though pneumonia cannot be excluded given clinical concern. Underlying vascular congestion is seen. No pneumothorax identified. The cardiomediastinal silhouette is enlarged. No acute osseous abnormalities identified. IMPRESSION: Small bilateral pleural effusions noted. Patchy bilateral airspace opacification likely reflects worsening pulmonary edema, though pneumonia cannot be excluded given clinical concern. Underlying vascular congestion and cardiomegaly. Electronically Signed   By: Roanna Raider M.D.   On: 06/21/2015 00:35   Dg Chest Port 1 View  06/20/2015  CLINICAL DATA:  Respiratory distress, shortness of breath, fluid overload, history CHF, asthma, COPD, diabetes mellitus, cardiomyopathy, hypertension, paroxysmal atrial  fibrillation EXAM: PORTABLE CHEST 1 VIEW COMPARISON:  Portable exam 1540 hours compared to 06/13/2015 FINDINGS: Enlargement of cardiac silhouette with pulmonary vascular congestion. Interstitial infiltrates bilaterally compatible with pulmonary edema and CHF, little changed when accounting for differences in technique. Suspect small RIGHT basilar pleural effusion. No pneumothorax. Bones demineralized. IMPRESSION: Persistent CHF and suspect small RIGHT pleural effusion, little changed. Electronically Signed   By: Ulyses Southward M.D.   On: 06/20/2015 16:18   Dg Foot Complete Left  06/21/2015  CLINICAL DATA:  82 year old female with injury to the left foot and big toe with soft tissue wound. Diabetic. Initial encounter. EXAM: LEFT FOOT - COMPLETE 3+ VIEW COMPARISON:  None.  FINDINGS: Sip dressing material about the left great toe. There is a mildly impacted but otherwise nondisplaced transverse fracture through the left big toe distal phalanx affecting the base, extra-articular. Great toe IP joint appears normal. First proximal phalanx and metatarsal are intact. MT P and IP joint subluxation elsewhere in the left foot. Probable healed fractures of the fourth and fifth proximal phalanges. No other acute fracture or dislocation identified. Pes planus. Small chronic appearing ossific fragments at the medial navicular and anterior inferior calcaneus. The calcaneus appears intact. No subcutaneous gas. IMPRESSION: Nondisplaced extra-articular fracture through the base of the left great toe distal phalanx. No other acute osseous abnormality identified. Electronically Signed   By: Odessa Fleming M.D.   On: 06/21/2015 10:25    Scheduled Meds: . clindamycin (CLEOCIN) IV  600 mg Intravenous 3 times per day  . escitalopram  10 mg Oral Daily  . furosemide  20 mg Intravenous BID  . isosorbide mononitrate  30 mg Oral Daily  . methimazole  5 mg Oral Once per day on Mon Wed Fri  . metoprolol tartrate  12.5 mg Oral BID  . warfarin  2 mg Oral Once  . Warfarin - Pharmacist Dosing Inpatient   Does not apply q1800   Continuous Infusions: . dextrose 5 % and 0.45% NaCl 50 mL/hr at 06/21/15 0351  . heparin 950 Units/hr (06/21/15 0517)  . nitroGLYCERIN 5 mcg/min (06/20/15 1930)    Active Problems:   Congestive heart failure (CHF) (HCC)   Acute respiratory failure (HCC)    Time spent: 25 min    [facility]  [facility] Pager [phone]. If 7PM-7AM, please contact night-coverage at [email or web address], password [facility] 06/21/2015, 1:43 PM  LOS: 1 day

## 2015-06-21 NOTE — Progress Notes (Signed)
Left great toe purplish in color, warm to touch swollen with old blood. Cleansed and dressed, MD aware with order for x-ray. Bilateral lower ext. Elevated with pravalon boots.

## 2015-06-22 LAB — CBC
HEMATOCRIT: 27 % — AB (ref 36.0–46.0)
HEMOGLOBIN: 8.3 g/dL — AB (ref 12.0–15.0)
MCH: 26.6 pg (ref 26.0–34.0)
MCHC: 30.7 g/dL (ref 30.0–36.0)
MCV: 86.5 fL (ref 78.0–100.0)
Platelets: 283 10*3/uL (ref 150–400)
RBC: 3.12 MIL/uL — ABNORMAL LOW (ref 3.87–5.11)
RDW: 18.5 % — ABNORMAL HIGH (ref 11.5–15.5)
WBC: 9 10*3/uL (ref 4.0–10.5)

## 2015-06-22 LAB — GLUCOSE, CAPILLARY
GLUCOSE-CAPILLARY: 135 mg/dL — AB (ref 65–99)
GLUCOSE-CAPILLARY: 146 mg/dL — AB (ref 65–99)
Glucose-Capillary: 214 mg/dL — ABNORMAL HIGH (ref 65–99)
Glucose-Capillary: 202 mg/dL — ABNORMAL HIGH (ref 65–99)
Glucose-Capillary: 85 mg/dL (ref 65–99)

## 2015-06-22 LAB — HEMOGLOBIN A1C
HEMOGLOBIN A1C: 6.3 % — AB (ref 4.8–5.6)
Mean Plasma Glucose: 134 mg/dL

## 2015-06-22 LAB — BASIC METABOLIC PANEL
ANION GAP: 9 (ref 5–15)
BUN: 50 mg/dL — ABNORMAL HIGH (ref 6–20)
CHLORIDE: 102 mmol/L (ref 101–111)
CO2: 31 mmol/L (ref 22–32)
Calcium: 8.8 mg/dL — ABNORMAL LOW (ref 8.9–10.3)
Creatinine, Ser: 2.29 mg/dL — ABNORMAL HIGH (ref 0.44–1.00)
GFR calc Af Amer: 22 mL/min — ABNORMAL LOW (ref >60)
GFR, EST NON AFRICAN AMERICAN: 19 mL/min — AB (ref >60)
Glucose, Bld: 155 mg/dL — ABNORMAL HIGH (ref 65–99)
POTASSIUM: 3.5 mmol/L (ref 3.5–5.1)
SODIUM: 142 mmol/L (ref 135–145)

## 2015-06-22 LAB — TSH: TSH: 2.893 u[IU]/mL (ref 0.350–4.500)

## 2015-06-22 LAB — PROTIME-INR
INR: 1.32 (ref 0.00–1.49)
Prothrombin Time: 16.5 seconds — ABNORMAL HIGH (ref 11.6–15.2)

## 2015-06-22 MED ORDER — GABAPENTIN 100 MG PO CAPS: 100.0000 mg | ORAL_CAPSULE | Freq: Every day | ORAL | Status: DC

## 2015-06-22 MED ORDER — HYDRALAZINE HCL 20 MG/ML IJ SOLN: 10.0000 mg | Freq: Once | INTRAMUSCULAR | Status: DC

## 2015-06-22 MED ORDER — BISACODYL 10 MG RE SUPP: 10.0000 mg | Freq: Every day | RECTAL | Status: DC | PRN

## 2015-06-22 MED ORDER — WARFARIN SODIUM 2 MG PO TABS
2.0000 mg | ORAL_TABLET | Freq: Once | ORAL | Status: AC
  Administered 2015-06-22: 2 mg via ORAL
  Filled 2015-06-22: qty 1

## 2015-06-22 MED ORDER — WARFARIN SODIUM 1 MG PO TABS: 1.0000 mg | ORAL_TABLET | Freq: Once | ORAL | Status: DC

## 2015-06-22 NOTE — Progress Notes (Signed)
[facility] PROGRESS NOTE  Alexis [name] HWT:[id number] DOB: 21-Feb-1932 DOA: 06/20/2015 PCP: Elizabeth Palau, FNP  Assessment/Plan: 1. Acute hypoxic respiratory failure- improving, not requiring BiPAP anymore , secondary to diastolic heart failure. Patient improved with one dose of Lasix given on the day of admission . Was started on  Lasix 20 mg IV every 12 hours. Continue oxygen via nasal cannula.  2. Acute on chronic kidney disease stage IV- patient presented with BUN/creatinine of 55/2.49, today mildly improved to 50/2.29 after diuresis with Lasix. Will continue with IV Lasix, follow BMP in a.m. 3. Nondisplaced extra-articular fracture left great toe- x-ray of the foot showed nondisplaced extra-articular fracture through the base of the left great toe distal phalanx. Orthopedic consultation has been obtained. Patient will be put on postop shoe as per orthopedics recommendation. 4. Hypertensive urgency- patient was started on IV nitroglycerin for hypertensive urgency, blood pressure has improved at this time. Nitroglycerin drip was discontinued and patient started on  hydralazine 50 mg every 12 hours. 5. Diabetes mellitus- hold oral hypoglycemic agents, start sliding scale insulin with NovoLog. 6. Paroxysmal atrial fibrillation- Chad2vasc score is 7, continue Coumadin. INR is 1.32 Continue metoprolol 25 mg by mouth twice a day. Patient was on  IV heparin for bridging for Coumadin. Heparin was discontinued  as patient hemoglobin  dropped to 7.9. 7. Hyperthyroidism- continue Synthroid 8. Anemia- today patient's hemoglobin is 8.3, improved from yesterday 7.9. She has a history of GI bleed in the past. Heparin was discontinued. Check stool for occult blood.  Code Status: DO NOT RESUSCITATE Family Communication: No family at bedside Disposition Plan: Pending improvement in respiratory  status   Consultants:  None  Procedures:  None  Antibiotics:  None  HPI/Subjective: 82 year old female with a history of chronic respiratory failure on home oxygen due to COPD, diastolic heart failure, paroxysmal atrial fibrillation on Coumadin diabetes mellitus who was discharged on 06/15/2015 after patient was treated for CHF and COPD exacerbation. Patient returned to the emergency room with worsening shortness of breath and worsening of renal function with creatinine 2.49 Chest x-ray showed pulmonary edema, BNP was elevated to 1716. Patient was given 1 dose of Lasix 40 mg IV 1, had a good diuresis with total output 2300. Today creatinine improved to 2.47.  Patient is breathing better, good diuretic response with Lasix.   Objective: Filed Vitals:   06/22/15 1110 06/22/15 1213  BP: 156/61 160/58  Pulse: 73 76  Temp: 98.3 F (36.8 C)   Resp: 19     Intake/Output Summary (Last 24 hours) at 06/22/15 1332 Last data filed at 06/22/15 1213  Gross per 24 hour  Intake 1409.5 ml  Output   2450 ml  Net -1040.5 ml   Filed Weights   06/20/15 1930  Weight: 64.2 kg (141 lb 8.6 oz)    Exam:   General:  Appears in no acute distress  Cardiovascular: S1-S2 regular  Respiratory: Bibasilar crackles  Abdomen: Soft nontender no organomegaly  Musculoskeletal: Left foot and postop shoe  Data Reviewed: Basic Metabolic Panel:  Recent Labs Lab 06/20/15 1630 06/21/15 0337 06/22/15 0330  NA 145 142 142  K 3.9 3.9 3.5  CL 103 98* 102  CO2 27 26 31   GLUCOSE 106* 233* 155*  BUN 55* 49* 50*  CREATININE 2.49* 2.47* 2.29*  CALCIUM 9.7 8.3* 8.8*   Liver Function Tests:  Recent Labs Lab 06/20/15 1630 06/21/15 0337  AST 27 19  ALT 19 14  ALKPHOS 62 41  BILITOT 0.6 0.7  PROT 7.3 5.5*  ALBUMIN 3.7 2.7*   No results for input(s): LIPASE, AMYLASE in the last 168 hours. No results for input(s): AMMONIA in the last 168 hours. CBC:  Recent Labs Lab 06/20/15 1630  06/21/15 0420 06/22/15 0330  WBC 10.5 7.0 9.0  NEUTROABS 8.2*  --   --   HGB 10.0* 7.9* 8.3*  HCT 33.9* 26.1* 27.0*  MCV 87.8 87.0 86.5  PLT 385 292 283   Cardiac Enzymes:  Recent Labs Lab 06/20/15 1630  TROPONINI <0.03   BNP (last 3 results)  Recent Labs  12/21/14 0745 06/11/15 1755 06/20/15 1630  BNP 557.5* 914.1* 1716.4*    ProBNP (last 3 results) No results for input(s): PROBNP in the last 8760 hours.  CBG:  Recent Labs Lab 06/21/15 1742 06/21/15 1931 06/22/15 06/22/15 0751 06/22/15 1110  GLUCAP 144* 156* 146* 135* 214*    Recent Results (from the past 240 hour(s))  Culture, blood (routine x 2)     Status: None (Preliminary result)   Collection Time: 06/20/15  4:20 PM  Result Value Ref Range Status   Specimen Description BLOOD RIGHT ANTECUBITAL  Final   Special Requests BOTTLES DRAWN AEROBIC AND ANAEROBIC 10CC  Final   Culture NO GROWTH < 24 HOURS  Final   Report Status PENDING  Incomplete  Culture, blood (routine x 2)     Status: None (Preliminary result)   Collection Time: 06/20/15  4:30 PM  Result Value Ref Range Status   Specimen Description BLOOD LEFT ARM  Final   Special Requests BOTTLES DRAWN AEROBIC AND ANAEROBIC 10CC  Final   Culture NO GROWTH < 24 HOURS  Final   Report Status PENDING  Incomplete  MRSA PCR Screening     Status: None   Collection Time: 06/20/15  8:02 PM  Result Value Ref Range Status   MRSA by PCR NEGATIVE NEGATIVE Final    Comment:        The GeneXpert MRSA Assay (FDA approved for NASAL specimens only), is one component of a comprehensive MRSA colonization surveillance program. It is not intended to diagnose MRSA infection nor to guide or monitor treatment for MRSA infections.      Studies: X-ray Chest Pa And Lateral  06/21/2015  CLINICAL DATA:  Follow-up pneumonia.  Subsequent encounter. EXAM: CHEST  2 VIEW COMPARISON:  Chest radiograph performed 06/20/2015 FINDINGS: Small bilateral pleural effusions are seen.  Patchy bilateral airspace opacification likely reflects worsening pulmonary edema, though pneumonia cannot be excluded given clinical concern. Underlying vascular congestion is seen. No pneumothorax identified. The cardiomediastinal silhouette is enlarged. No acute osseous abnormalities identified. IMPRESSION: Small bilateral pleural effusions noted. Patchy bilateral airspace opacification likely reflects worsening pulmonary edema, though pneumonia cannot be excluded given clinical concern. Underlying vascular congestion and cardiomegaly. Electronically Signed   By: Roanna Raider M.D.   On: 06/21/2015 00:35   Dg Chest Port 1 View  06/20/2015  CLINICAL DATA:  Respiratory distress, shortness of breath, fluid overload, history CHF, asthma, COPD, diabetes mellitus, cardiomyopathy, hypertension, paroxysmal atrial fibrillation EXAM: PORTABLE CHEST 1 VIEW COMPARISON:  Portable exam 1540 hours compared to 06/13/2015 FINDINGS: Enlargement of cardiac silhouette with pulmonary vascular congestion. Interstitial infiltrates bilaterally compatible with pulmonary edema and CHF, little changed when accounting for differences in technique. Suspect small RIGHT basilar pleural effusion. No pneumothorax. Bones demineralized. IMPRESSION: Persistent CHF and suspect small RIGHT pleural effusion, little changed. Electronically Signed   By: Ulyses Southward M.D.   On: 06/20/2015 16:18   Dg Foot Complete  Left  06/21/2015  CLINICAL DATA:  82 year old female with injury to the left foot and big toe with soft tissue wound. Diabetic. Initial encounter. EXAM: LEFT FOOT - COMPLETE 3+ VIEW COMPARISON:  None. FINDINGS: Sip dressing material about the left great toe. There is a mildly impacted but otherwise nondisplaced transverse fracture through the left big toe distal phalanx affecting the base, extra-articular. Great toe IP joint appears normal. First proximal phalanx and metatarsal are intact. MT P and IP joint subluxation elsewhere in the  left foot. Probable healed fractures of the fourth and fifth proximal phalanges. No other acute fracture or dislocation identified. Pes planus. Small chronic appearing ossific fragments at the medial navicular and anterior inferior calcaneus. The calcaneus appears intact. No subcutaneous gas. IMPRESSION: Nondisplaced extra-articular fracture through the base of the left great toe distal phalanx. No other acute osseous abnormality identified. Electronically Signed   By: Odessa Fleming M.D.   On: 06/21/2015 10:25    Scheduled Meds: . escitalopram  10 mg Oral Daily  . furosemide  20 mg Intravenous BID  . gabapentin  100 mg Oral QHS  . hydrALAZINE  50 mg Oral Q12H  . insulin aspart  0-9 Units Subcutaneous TID WC  . isosorbide mononitrate  30 mg Oral Daily  . methimazole  5 mg Oral Daily  . metoprolol tartrate  12.5 mg Oral BID  . warfarin  1 mg Oral ONCE-1800  . Warfarin - Pharmacist Dosing Inpatient   Does not apply q1800   Continuous Infusions: . dextrose 5 % and 0.45% NaCl 50 mL/hr at 06/22/15 0313    Active Problems:   Congestive heart failure (CHF) (HCC)   Acute respiratory failure (HCC)    Time spent: 25 min    [facility]  [facility] Pager [phone]. If 7PM-7AM, please contact night-coverage at [email or web address], password [facility] 06/22/2015, 1:32 PM  LOS: 2 days

## 2015-06-22 NOTE — Progress Notes (Addendum)
Addendum: MD requests pharmacy be more aggressive with warfarin dose today since no longer on heparin. Will give warfarin 2 mg dose x 1 today and continue to monitor as below  ANTICOAGULATION CONSULT NOTE - Follow Up Consult  Pharmacy Consult for warfarin Indication: atrial fibrillation  Allergies  Allergen Reactions  . Yellow Dyes (Non-Tartrazine) Nausea And Vomiting    "deathly allergic" No other information given to explain reaction.  . Codeine Hives  . Other Other (See Comments)    novocaine broke mouth out  . Penicillins Hives  . Procaine Hives   Patient Measurements: Height: 5\' 2"  (157.5 cm) Weight: 141 lb 8.6 oz (64.2 kg) IBW/kg (Calculated) : 50.1  Vital Signs: Temp: 98.5 F (36.9 C) (01/26 0751) Temp Source: Oral (01/26 0751) BP: 142/51 mmHg (01/26 1000) Pulse Rate: 74 (01/26 1000)  Labs:  Recent Labs  06/20/15 1630 06/21/15 0337 06/21/15 0420 06/21/15 0500 06/21/15 1318 06/22/15 0330  HGB 10.0*  --  7.9*  --   --  8.3*  HCT 33.9*  --  26.1*  --   --  27.0*  PLT 385  --  292  --   --  283  LABPROT 15.8*  --  17.5*  --  17.3* 16.5*  INR 1.25  --  1.42  --  1.40 1.32  HEPARINUNFRC  --   --   --  0.18* 0.41  --   CREATININE 2.49* 2.47*  --   --   --  2.29*  TROPONINI <0.03  --   --   --   --   --     Estimated Creatinine Clearance: 16.9 mL/min (by C-G formula based on Cr of 2.29).  Assessment: On heparin for afib while INR is subtheraputic. INR 1.32 today.  H/H dropped 1/25 and heparin was dc'd. Was given 1 mg dose 1/25 PTA warfarin dose 1 mg daily  Goal of Therapy:  INR 2-3 Heparin level 0.3-0.7 units/ml Monitor platelets by anticoagulation protocol: Yes   Plan:  Give warfarin 2 mg po x 1 Monitor daily INR, CBC, clinical course, s/sx of bleed, PO intake, DDI  Thank you for allowing [place] to participate in this patients care. Signe Colt, PharmD  06/22/2015 11:03 AM

## 2015-06-22 NOTE — Progress Notes (Signed)
Patient has arrived on unit from 2S.Patient oriented to unit, assessed, placed on tele and verified, VS were normal.  Call bell and phone given to patient.

## 2015-06-23 DIAGNOSIS — I5032 Chronic diastolic (congestive) heart failure: Secondary | ICD-10-CM

## 2015-06-23 DIAGNOSIS — E1165 Type 2 diabetes mellitus with hyperglycemia: Secondary | ICD-10-CM

## 2015-06-23 DIAGNOSIS — I10 Essential (primary) hypertension: Secondary | ICD-10-CM

## 2015-06-23 DIAGNOSIS — I480 Paroxysmal atrial fibrillation: Secondary | ICD-10-CM

## 2015-06-23 DIAGNOSIS — E1159 Type 2 diabetes mellitus with other circulatory complications: Secondary | ICD-10-CM

## 2015-06-23 DIAGNOSIS — Z7901 Long term (current) use of anticoagulants: Secondary | ICD-10-CM

## 2015-06-23 DIAGNOSIS — E0590 Thyrotoxicosis, unspecified without thyrotoxic crisis or storm: Secondary | ICD-10-CM

## 2015-06-23 LAB — CBC
HCT: 26.2 % — ABNORMAL LOW (ref 36.0–46.0)
HEMOGLOBIN: 8.1 g/dL — AB (ref 12.0–15.0)
MCH: 26.7 pg (ref 26.0–34.0)
MCHC: 30.9 g/dL (ref 30.0–36.0)
MCV: 86.5 fL (ref 78.0–100.0)
PLATELETS: 269 10*3/uL (ref 150–400)
RBC: 3.03 MIL/uL — AB (ref 3.87–5.11)
RDW: 18.2 % — ABNORMAL HIGH (ref 11.5–15.5)
WBC: 6.6 10*3/uL (ref 4.0–10.5)

## 2015-06-23 LAB — BASIC METABOLIC PANEL
ANION GAP: 7 (ref 5–15)
Anion gap: 10 (ref 5–15)
BUN: 41 mg/dL — ABNORMAL HIGH (ref 6–20)
BUN: 39 mg/dL — AB (ref 6–20)
CHLORIDE: 105 mmol/L (ref 101–111)
CHLORIDE: 105 mmol/L (ref 101–111)
CO2: 30 mmol/L (ref 22–32)
CO2: 28 mmol/L (ref 22–32)
CREATININE: 2.07 mg/dL — AB (ref 0.44–1.00)
Calcium: 8.7 mg/dL — ABNORMAL LOW (ref 8.9–10.3)
Calcium: 9.4 mg/dL (ref 8.9–10.3)
Creatinine, Ser: 2.14 mg/dL — ABNORMAL HIGH (ref 0.44–1.00)
GFR calc Af Amer: 24 mL/min — ABNORMAL LOW (ref >60)
GFR calc non Af Amer: 21 mL/min — ABNORMAL LOW (ref >60)
GFR, EST AFRICAN AMERICAN: 25 mL/min — AB (ref >60)
GFR, EST NON AFRICAN AMERICAN: 20 mL/min — AB (ref >60)
GLUCOSE: 138 mg/dL — AB (ref 65–99)
Glucose, Bld: 269 mg/dL — ABNORMAL HIGH (ref 65–99)
POTASSIUM: 3.1 mmol/L — AB (ref 3.5–5.1)
POTASSIUM: 5 mmol/L (ref 3.5–5.1)
Sodium: 142 mmol/L (ref 135–145)
Sodium: 143 mmol/L (ref 135–145)

## 2015-06-23 LAB — GLUCOSE, CAPILLARY
GLUCOSE-CAPILLARY: 117 mg/dL — AB (ref 65–99)
Glucose-Capillary: 198 mg/dL — ABNORMAL HIGH (ref 65–99)

## 2015-06-23 LAB — PROTIME-INR
INR: 1.44 (ref 0.00–1.49)
Prothrombin Time: 17.7 seconds — ABNORMAL HIGH (ref 11.6–15.2)

## 2015-06-23 LAB — MAGNESIUM: Magnesium: 2 mg/dL (ref 1.7–2.4)

## 2015-06-23 MED ORDER — POTASSIUM CHLORIDE 10 MEQ/100ML IV SOLN
10.0000 meq | INTRAVENOUS | Status: AC
  Administered 2015-06-23 (×2): 10 meq via INTRAVENOUS
  Filled 2015-06-23 (×2): qty 100

## 2015-06-23 MED ORDER — WARFARIN SODIUM 2.5 MG PO TABS: 2.5000 mg | ORAL_TABLET | Freq: Every day | ORAL | Status: DC

## 2015-06-23 MED ORDER — HYDRALAZINE HCL 50 MG PO TABS: 50.0000 mg | ORAL_TABLET | Freq: Two times a day (BID) | ORAL | Status: DC

## 2015-06-23 MED ORDER — WARFARIN SODIUM 2 MG PO TABS: 2.0000 mg | ORAL_TABLET | Freq: Once | ORAL | Status: DC

## 2015-06-23 MED ORDER — POTASSIUM CHLORIDE CRYS ER 20 MEQ PO TBCR
40.0000 meq | EXTENDED_RELEASE_TABLET | ORAL | Status: AC
  Administered 2015-06-23 (×2): 40 meq via ORAL
  Filled 2015-06-23 (×2): qty 2

## 2015-06-23 NOTE — Care Management Important Message (Signed)
Important Message  Patient Details  Name: Alexis [name] MRN: [id number] Date of Birth: 1931-11-28   Medicare Important Message Given:  Yes    Ilham Roughton Abena 06/23/2015, 11:16 AM

## 2015-06-23 NOTE — Care Management Note (Addendum)
Case Management Note  Patient Details  Name: Alexis [name] MRN: [id number] Date of Birth: 26-Mar-1932  Subjective/Objective:        Adm w resp distress, chf            Action/Plan: lives w son, pcp dr Dareen Piano.  Pt is active with [facility] for RN and PT and home O2.  CM contacted agency and informed of admit.   Expected Discharge Date:                  Expected Discharge Plan:  Home w Home Health Services  In-House Referral:     Discharge planning Services     Post Acute Care Choice:  Resumption of Svcs/PTA Provider Choice offered to:  Patient  DME Arranged:    DME Agency:     HH Arranged:  RN, Disease Management, PT HH Agency:  [facility]  Status of Service:  Completed, signed off  Medicare Important Message Given:  Yes Date Medicare IM Given:    Medicare IM give by:    Date Additional Medicare IM Given:    Additional Medicare Important Message give by:     If discussed at Long Length of Stay Meetings, dates discussed:    Additional Comments: ur review done, act w [facility], chart states has rolator and home o2 06/23/2015  Pt offered choice for [facility], pt chose to resume services with [facility].  CM contacted [facility] and informed of discharge today, agency accepted resumption of services.  Pt liter flow requirement at discharge is less than current prescribed liter and therefore resumption for home O2 not required.    CM contacted [facility] and informed of admit, asked bedside nurse and MD via sticky note to resume home health Cherylann Parr, RN 06/23/2015, 4:20 PM

## 2015-06-23 NOTE — Discharge Summary (Addendum)
Physician Discharge Summary  Alexis [name] ZOX:[id number] DOB: 1932-05-09 DOA: 06/20/2015  PCP: Elizabeth Palau, FNP  Admit date: 06/20/2015 Discharge date: 06/23/2015  Time spent: 25* minutes  Recommendations for Outpatient Follow-up:  1. Follow-up orthopedic surgery in 2 weeks   Discharge Diagnoses:  Active Problems:   Diastolic congestive heart failure (HCC)   Hypertension   Type 2 diabetes mellitus, uncontrolled (HCC)   Paroxysmal atrial fibrillation (HCC)   Long-term (current) use of anticoagulants   Hyperthyroidism   Congestive heart failure (CHF) (HCC)   Acute respiratory failure (HCC)   Discharge Condition: Stable  Diet recommendation: Low-salt diet  Filed Weights   06/20/15 1930  Weight: 64.2 kg (141 lb 8.6 oz)    History of present illness:  82 year old female with a history of chronic respiratory failure on home oxygen due to COPD, diastolic heart failure, paroxysmal atrial fibrillation on Coumadin diabetes mellitus who was discharged on 06/15/2015 after patient was treated for CHF and COPD exacerbation. Patient returned to the emergency room with worsening shortness of breath and worsening of renal function with creatinine 2.49 Chest x-ray showed pulmonary edema, BNP was elevated to 1716. Patient was given 1 dose of Lasix 40 mg IV 1, had a good diuresis with total output 2300. Today creatinine improved to 2.47.  Hospital Course:  1. Acute hypoxic respiratory failure- resolved, not requiring BiPAP anymore , secondary to diastolic heart failure. Patient improved with one dose of Lasix given on the day of admission . Was started on Lasix 20 mg IV every 12 hours. Breathing is improved .Continue oxygen via nasal cannula.  2. Acute on chronic kidney disease stage IV- patient presented with BUN/creatinine of 55/2.49, today  diuresis creatinine is 2.07 after diuresis with  Lasix. Patient will continue with Demadex 20 mg by mouth twice a day at home. 3. Nondisplaced  extra-articular fracture left great toe- x-ray of the foot showed nondisplaced extra-articular fracture through the base of the left great toe distal phalanx. Orthopedic consultation has been obtained. Patient will be put on postop shoe as per orthopedics recommendation. Will follow-up orthopedic surgery in 2 weeks 4. Hypertensive urgency- patient was started on IV nitroglycerin for hypertensive urgency, blood pressure has improved at this time. Nitroglycerin drip was discontinued and patient started on hydralazine 50 mg every 12 hours. Also change metoprolol to 25 mg by mouth twice a day 5. Diabetes mellitus- continue Glucotrol 6. Paroxysmal atrial fibrillation- Chad2vasc score is 7, continue Coumadin. INR is 1.32 Continue metoprolol 25 mg by mouth twice a day. Patient was on IV heparin for bridging for Coumadin. Heparin was discontinued as patient hemoglobin dropped to 7.9. At this time patient's INR is 1.44, will change the dose of Coumadin to 2.5 mg by mouth daily. Will have home health RN to check the PT/INR. 7. Hyperthyroidism- continue Synthroid 8. Anemia- today patient's hemoglobin is 8.3, improved from yesterday 7.9. She has a history of GI bleed in the past. Heparin was discontinued. Likely secondary to anemia of chronic disease.  Patient's medication list showed prednisone 40 mg by mouth daily. I checked with the pharmacy and also confirmed with patient's pharmacy patient did not have this medication filled since last year. Will not resume prednisone at this time. No indication to give daily prednisone.  Procedures:  None  Consultations:  None   Discharge Exam: Filed Vitals:   06/23/15 1031 06/23/15 1433  BP: 153/46 159/46  Pulse: 62 60  Temp:  97.6 F (36.4 C)  Resp:  18  General: Appears in no acute distress Cardiovascular: S1-S2 regular Respiratory: Clear to auscultation bilaterally  Discharge Instructions   Discharge Instructions    Diet - low sodium heart  healthy    Complete by:  As directed      Discharge instructions    Complete by:  As directed      Increase activity slowly    Complete by:  As directed           Current Discharge Medication List    CONTINUE these medications which have CHANGED   Details  hydrALAZINE (APRESOLINE) 50 MG tablet Take 1 tablet (50 mg total) by mouth every 12 (twelve) hours. Qty: 60 tablet, Refills: 2    warfarin (COUMADIN) 2.5 MG tablet Take 1 tablet (2.5 mg total) by mouth daily at 6 PM. Qty: 30 tablet, Refills: 2      CONTINUE these medications which have NOT CHANGED   Details  albuterol (PROVENTIL HFA;VENTOLIN HFA) 108 (90 BASE) MCG/ACT inhaler Inhale 2 puffs into the lungs every 6 (six) hours as needed for wheezing or shortness of breath. Qty: 1 Inhaler, Refills: 3    aspirin EC 81 MG tablet Take 81 mg by mouth every 6 (six) hours as needed for moderate pain.    atorvastatin (LIPITOR) 20 MG tablet Take 20 mg by mouth at bedtime.    cholecalciferol (VITAMIN D) 1000 UNITS tablet Take 1,000 Units by mouth every morning.     escitalopram (LEXAPRO) 10 MG tablet Take 10 mg by mouth daily.    fenofibrate 160 MG tablet Take 160 mg by mouth daily.    gabapentin (NEURONTIN) 100 MG capsule Take 1 capsule (100 mg total) by mouth daily. Qty: 30 capsule, Refills: 0    glipiZIDE (GLUCOTROL XL) 5 MG 24 hr tablet Take 5 mg by mouth daily with breakfast.    isosorbide mononitrate (IMDUR) 30 MG 24 hr tablet TAKE 1 TABLET BY MOUTH EVERY DAY Qty: 30 tablet, Refills: 6    methimazole (TAPAZOLE) 5 MG tablet Take 1 tablet (5 mg total) by mouth 3 (three) times a week. Qty: 30 tablet, Refills: 2    metoprolol tartrate (LOPRESSOR) 25 MG tablet TAKE 1 TABLET BY MOUTH TWICE A DAY Qty: 60 tablet, Refills: 9    Multiple Vitamin (MULTIVITAMIN WITH MINERALS) TABS tablet Take 1 tablet by mouth daily.    OXYGEN Inhale 3 L into the lungs at bedtime.     potassium chloride SA (K-DUR,KLOR-CON) 20 MEQ tablet Take 1  tablet (20 mEq total) by mouth daily. Qty: 60 tablet, Refills: 2    torsemide (DEMADEX) 20 MG tablet Take 1 tablet (20 mg total) by mouth 2 (two) times daily. Qty: 60 tablet, Refills: 0      STOP taking these medications     predniSONE (DELTASONE) 20 MG tablet        Allergies  Allergen Reactions  . Yellow Dyes (Non-Tartrazine) Nausea And Vomiting    "deathly allergic" No other information given to explain reaction.  . Codeine Hives  . Other Other (See Comments)    novocaine broke mouth out  . Penicillins Hives  . Procaine Hives   Follow-up Information    Follow up with MURPHY, TIMOTHY D, MD In 2 weeks.   Specialty:  Orthopedic Surgery   Contact information:   [address] [place] [address] [phone]       Follow up with ANDERSON,TERESA, FNP In 2 weeks.   Specialty:  Nurse Practitioner   Contact  information:   [address] [place] [address] [phone]        The results of significant diagnostics from this hospitalization (including imaging, microbiology, ancillary and laboratory) are listed below for reference.    Significant Diagnostic Studies: X-ray Chest Pa And Lateral  06/21/2015  CLINICAL DATA:  Follow-up pneumonia.  Subsequent encounter. EXAM: CHEST  2 VIEW COMPARISON:  Chest radiograph performed 06/20/2015 FINDINGS: Small bilateral pleural effusions are seen. Patchy bilateral airspace opacification likely reflects worsening pulmonary edema, though pneumonia cannot be excluded given clinical concern. Underlying vascular congestion is seen. No pneumothorax identified. The cardiomediastinal silhouette is enlarged. No acute osseous abnormalities identified. IMPRESSION: Small bilateral pleural effusions noted. Patchy bilateral airspace opacification likely reflects worsening pulmonary edema, though pneumonia cannot be excluded given clinical concern. Underlying vascular congestion and cardiomegaly. Electronically Signed    By: Roanna Raider M.D.   On: 06/21/2015 00:35   Dg Chest Port 1 View  06/20/2015  CLINICAL DATA:  Respiratory distress, shortness of breath, fluid overload, history CHF, asthma, COPD, diabetes mellitus, cardiomyopathy, hypertension, paroxysmal atrial fibrillation EXAM: PORTABLE CHEST 1 VIEW COMPARISON:  Portable exam 1540 hours compared to 06/13/2015 FINDINGS: Enlargement of cardiac silhouette with pulmonary vascular congestion. Interstitial infiltrates bilaterally compatible with pulmonary edema and CHF, little changed when accounting for differences in technique. Suspect small RIGHT basilar pleural effusion. No pneumothorax. Bones demineralized. IMPRESSION: Persistent CHF and suspect small RIGHT pleural effusion, little changed. Electronically Signed   By: Ulyses Southward M.D.   On: 06/20/2015 16:18   Dg Chest Port 1 View  06/13/2015  CLINICAL DATA:  Sudden onset of shortness of breath, congestion P EXAM: PORTABLE CHEST 1 VIEW COMPARISON:  06/11/2015 FINDINGS: Cardiomegaly with vascular congestion and bilateral airspace opacities most compatible with edema/CHF. No significant effusions. No acute bony abnormality. IMPRESSION: Moderate CHF changes, stable since prior study. Electronically Signed   By: Charlett Nose M.D.   On: 06/13/2015 21:16   Dg Chest Portable 1 View  06/11/2015  CLINICAL DATA:  Shortness of breath EXAM: PORTABLE CHEST 1 VIEW COMPARISON:  12/22/2014 chest radiograph. FINDINGS: Stable cardiomediastinal silhouette with mild cardiomegaly. No pneumothorax. Likely small right pleural effusion. No left pleural effusion. Diffuse fluffy and linear parahilar opacities throughout both lungs, worsened in the interval, probably moderate pulmonary edema. IMPRESSION: Mild cardiomegaly with diffuse fluffy and linear parahilar opacities throughout both lungs, favor moderate pulmonary edema due to moderate congestive heart failure. Small right pleural effusion. Electronically Signed   By: Delbert Phenix M.D.    On: 06/11/2015 17:57   Dg Foot Complete Left  06/21/2015  CLINICAL DATA:  82 year old female with injury to the left foot and big toe with soft tissue wound. Diabetic. Initial encounter. EXAM: LEFT FOOT - COMPLETE 3+ VIEW COMPARISON:  None. FINDINGS: Sip dressing material about the left great toe. There is a mildly impacted but otherwise nondisplaced transverse fracture through the left big toe distal phalanx affecting the base, extra-articular. Great toe IP joint appears normal. First proximal phalanx and metatarsal are intact. MT P and IP joint subluxation elsewhere in the left foot. Probable healed fractures of the fourth and fifth proximal phalanges. No other acute fracture or dislocation identified. Pes planus. Small chronic appearing ossific fragments at the medial navicular and anterior inferior calcaneus. The calcaneus appears intact. No subcutaneous gas. IMPRESSION: Nondisplaced extra-articular fracture through the base of the left great toe distal phalanx. No other acute osseous abnormality identified. Electronically Signed   By: Althea Grimmer.D.  On: 06/21/2015 10:25    Microbiology: Recent Results (from the past 240 hour(s))  Culture, blood (routine x 2)     Status: None (Preliminary result)   Collection Time: 06/20/15  4:20 PM  Result Value Ref Range Status   Specimen Description BLOOD RIGHT ANTECUBITAL  Final   Special Requests BOTTLES DRAWN AEROBIC AND ANAEROBIC 10CC  Final   Culture NO GROWTH 3 DAYS  Final   Report Status PENDING  Incomplete  Culture, blood (routine x 2)     Status: None (Preliminary result)   Collection Time: 06/20/15  4:30 PM  Result Value Ref Range Status   Specimen Description BLOOD LEFT ARM  Final   Special Requests BOTTLES DRAWN AEROBIC AND ANAEROBIC 10CC  Final   Culture NO GROWTH 3 DAYS  Final   Report Status PENDING  Incomplete  MRSA PCR Screening     Status: None   Collection Time: 06/20/15  8:02 PM  Result Value Ref Range Status   MRSA by PCR NEGATIVE  NEGATIVE Final    Comment:        The GeneXpert MRSA Assay (FDA approved for NASAL specimens only), is one component of a comprehensive MRSA colonization surveillance program. It is not intended to diagnose MRSA infection nor to guide or monitor treatment for MRSA infections.      Labs: Basic Metabolic Panel:  Recent Labs Lab 06/20/15 1630 06/21/15 0337 06/22/15 0330 06/23/15 0256 06/23/15 1230 06/23/15 1443  NA 145 142 142 142  --  143  K 3.9 3.9 3.5 3.1*  --  5.0  CL 103 98* 102 105  --  105  CO2 27 26 31 30   --  28  GLUCOSE 106* 233* 155* 138*  --  269*  BUN 55* 49* 50* 41*  --  39*  CREATININE 2.49* 2.47* 2.29* 2.14*  --  2.07*  CALCIUM 9.7 8.3* 8.8* 8.7*  --  9.4  MG  --   --   --   --  2.0  --    Liver Function Tests:  Recent Labs Lab 06/20/15 1630 06/21/15 0337  AST 27 19  ALT 19 14  ALKPHOS 62 41  BILITOT 0.6 0.7  PROT 7.3 5.5*  ALBUMIN 3.7 2.7*   No results for input(s): LIPASE, AMYLASE in the last 168 hours. No results for input(s): AMMONIA in the last 168 hours. CBC:  Recent Labs Lab 06/20/15 1630 06/21/15 0420 06/22/15 0330 06/23/15 0256  WBC 10.5 7.0 9.0 6.6  NEUTROABS 8.2*  --   --   --   HGB 10.0* 7.9* 8.3* 8.1*  HCT 33.9* 26.1* 27.0* 26.2*  MCV 87.8 87.0 86.5 86.5  PLT 385 292 283 269   Cardiac Enzymes:  Recent Labs Lab 06/20/15 1630  TROPONINI <0.03   BNP: BNP (last 3 results)  Recent Labs  12/21/14 0745 06/11/15 1755 06/20/15 1630  BNP 557.5* 914.1* 1716.4*    ProBNP (last 3 results) No results for input(s): PROBNP in the last 8760 hours.  CBG:  Recent Labs Lab 06/22/15 1110 06/22/15 1644 06/22/15 2121 06/23/15 0624 06/23/15 1117  GLUCAP 214* 202* 85 117* 198*       Signed:  Mauro Kaufmann S MD.  [facility] 06/23/2015, 4:11 PM

## 2015-06-23 NOTE — Discharge Instructions (Signed)

## 2015-06-23 NOTE — Progress Notes (Signed)
ANTICOAGULATION CONSULT NOTE - Follow Up Consult  Pharmacy Consult for warfarin Indication: atrial fibrillation  Allergies  Allergen Reactions  . Yellow Dyes (Non-Tartrazine) Nausea And Vomiting    "deathly allergic" No other information given to explain reaction.  . Codeine Hives  . Other Other (See Comments)    novocaine broke mouth out  . Penicillins Hives  . Procaine Hives   Patient Measurements: Height: 5\' 2"  (157.5 cm) Weight: 141 lb 8.6 oz (64.2 kg) IBW/kg (Calculated) : 50.1  Vital Signs: Temp: 98.3 F (36.8 C) (01/27 0626) Temp Source: Oral (01/27 0626) BP: 152/2 mmHg (01/27 0626) Pulse Rate: 56 (01/27 0626)  Labs:  Recent Labs  06/20/15 1630 06/21/15 0337 06/21/15 0420 06/21/15 0500 06/21/15 1318 06/22/15 0330 06/23/15 0256  HGB 10.0*  --  7.9*  --   --  8.3* 8.1*  HCT 33.9*  --  26.1*  --   --  27.0* 26.2*  PLT 385  --  292  --   --  283 269  LABPROT 15.8*  --  17.5*  --  17.3* 16.5* 17.7*  INR 1.25  --  1.42  --  1.40 1.32 1.44  HEPARINUNFRC  --   --   --  0.18* 0.41  --   --   CREATININE 2.49* 2.47*  --   --   --  2.29* 2.14*  TROPONINI <0.03  --   --   --   --   --   --     Estimated Creatinine Clearance: 18.1 mL/min (by C-G formula based on Cr of 2.14).  Assessment: 83 yof continuing on warfarin for afib. Heparin d/c'd 1/25 due to drop in Hg. INR 1.32 today. Hg 8.1, plt wnl. No bleed documented. Will f/u with RN about ordering scds  PTA warfarin dose 1 mg daily  Goal of Therapy:  INR 2-3 Monitor platelets by anticoagulation protocol: Yes   Plan:  Warfarin 2 mg po x 1 dose tonight Daily INR Mon CBC, s/sx bleeding F/u scds?  Babs Bertin, PharmD, BCPS Clinical Pharmacist Pager [phone] 06/23/2015 10:22 AM

## 2015-06-23 NOTE — Progress Notes (Signed)
Order received to discharge.  Telemetry removed and CCMD notified.  IV removed with catheter intact.  Discharge education given to Pt with daughter at bedside.  [facility] [address].  Coumadin education given with emphasis on diet.  Pt indicates understanding and daughter states she will give information to Pt's son who cooks for Pt.  Pt stable at discharge, 98% on 2L of oxygen.  Notified Pt she has been stable on 2L.  All questions answered.  Notes made on discharge summary for follow up.  Suggested daughter call [facility] when Pt gets home to alert agency Pt has been discharged.  Daughter indicates understanding.  Pt assisted via WC to exit by this RN.  Pt stable, no chest pain or SOB.

## 2015-06-25 LAB — CULTURE, BLOOD (ROUTINE X 2)
Culture: NO GROWTH
Culture: NO GROWTH

## 2015-06-29 ENCOUNTER — Emergency Department (HOSPITAL_COMMUNITY)
Admission: EM | Admit: 2015-06-29 | Discharge: 2015-06-29 | Disposition: A | Discharge status: Home / Self Care | Payer: Medicare Other | Source: Home / Self Care | Attending: Emergency Medicine | Admitting: Emergency Medicine

## 2015-06-29 DIAGNOSIS — B3749 Other urogenital candidiasis: Secondary | ICD-10-CM | POA: Diagnosis present

## 2015-06-29 DIAGNOSIS — F419 Anxiety disorder, unspecified: Secondary | ICD-10-CM

## 2015-06-29 DIAGNOSIS — Z79899 Other long term (current) drug therapy: Secondary | ICD-10-CM

## 2015-06-29 DIAGNOSIS — E162 Hypoglycemia, unspecified: Secondary | ICD-10-CM

## 2015-06-29 DIAGNOSIS — N184 Chronic kidney disease, stage 4 (severe): Secondary | ICD-10-CM | POA: Diagnosis present

## 2015-06-29 DIAGNOSIS — E0590 Thyrotoxicosis, unspecified without thyrotoxic crisis or storm: Secondary | ICD-10-CM | POA: Diagnosis present

## 2015-06-29 DIAGNOSIS — Z7722 Contact with and (suspected) exposure to environmental tobacco smoke (acute) (chronic): Secondary | ICD-10-CM | POA: Diagnosis present

## 2015-06-29 DIAGNOSIS — I480 Paroxysmal atrial fibrillation: Secondary | ICD-10-CM | POA: Insufficient documentation

## 2015-06-29 DIAGNOSIS — D649 Anemia, unspecified: Secondary | ICD-10-CM | POA: Diagnosis present

## 2015-06-29 DIAGNOSIS — I129 Hypertensive chronic kidney disease with stage 1 through stage 4 chronic kidney disease, or unspecified chronic kidney disease: Secondary | ICD-10-CM | POA: Insufficient documentation

## 2015-06-29 DIAGNOSIS — Z66 Do not resuscitate: Secondary | ICD-10-CM | POA: Diagnosis present

## 2015-06-29 DIAGNOSIS — Z884 Allergy status to anesthetic agent status: Secondary | ICD-10-CM

## 2015-06-29 DIAGNOSIS — Z880 Allergy status to penicillin: Secondary | ICD-10-CM

## 2015-06-29 DIAGNOSIS — Z7901 Long term (current) use of anticoagulants: Secondary | ICD-10-CM

## 2015-06-29 DIAGNOSIS — Z9981 Dependence on supplemental oxygen: Secondary | ICD-10-CM

## 2015-06-29 DIAGNOSIS — N189 Chronic kidney disease, unspecified: Secondary | ICD-10-CM | POA: Insufficient documentation

## 2015-06-29 DIAGNOSIS — E1122 Type 2 diabetes mellitus with diabetic chronic kidney disease: Secondary | ICD-10-CM | POA: Diagnosis present

## 2015-06-29 DIAGNOSIS — I5033 Acute on chronic diastolic (congestive) heart failure: Secondary | ICD-10-CM | POA: Insufficient documentation

## 2015-06-29 DIAGNOSIS — E11649 Type 2 diabetes mellitus with hypoglycemia without coma: Secondary | ICD-10-CM

## 2015-06-29 DIAGNOSIS — J9621 Acute and chronic respiratory failure with hypoxia: Secondary | ICD-10-CM | POA: Diagnosis not present

## 2015-06-29 DIAGNOSIS — Z7984 Long term (current) use of oral hypoglycemic drugs: Secondary | ICD-10-CM

## 2015-06-29 DIAGNOSIS — J449 Chronic obstructive pulmonary disease, unspecified: Secondary | ICD-10-CM

## 2015-06-29 DIAGNOSIS — Z8669 Personal history of other diseases of the nervous system and sense organs: Secondary | ICD-10-CM | POA: Insufficient documentation

## 2015-06-29 DIAGNOSIS — I130 Hypertensive heart and chronic kidney disease with heart failure and stage 1 through stage 4 chronic kidney disease, or unspecified chronic kidney disease: Secondary | ICD-10-CM | POA: Diagnosis present

## 2015-06-29 DIAGNOSIS — I429 Cardiomyopathy, unspecified: Secondary | ICD-10-CM | POA: Diagnosis present

## 2015-06-29 DIAGNOSIS — Z8719 Personal history of other diseases of the digestive system: Secondary | ICD-10-CM | POA: Insufficient documentation

## 2015-06-29 DIAGNOSIS — S92402D Displaced unspecified fracture of left great toe, subsequent encounter for fracture with routine healing: Secondary | ICD-10-CM

## 2015-06-29 DIAGNOSIS — N390 Urinary tract infection, site not specified: Secondary | ICD-10-CM | POA: Insufficient documentation

## 2015-06-29 DIAGNOSIS — E1142 Type 2 diabetes mellitus with diabetic polyneuropathy: Secondary | ICD-10-CM | POA: Diagnosis present

## 2015-06-29 DIAGNOSIS — H3530 Unspecified macular degeneration: Secondary | ICD-10-CM | POA: Diagnosis present

## 2015-06-29 DIAGNOSIS — R001 Bradycardia, unspecified: Secondary | ICD-10-CM | POA: Diagnosis present

## 2015-06-29 DIAGNOSIS — Z7982 Long term (current) use of aspirin: Secondary | ICD-10-CM

## 2015-06-29 DIAGNOSIS — Z885 Allergy status to narcotic agent status: Secondary | ICD-10-CM

## 2015-06-29 DIAGNOSIS — Z91048 Other nonmedicinal substance allergy status: Secondary | ICD-10-CM

## 2015-06-29 LAB — BASIC METABOLIC PANEL
ANION GAP: 10 (ref 5–15)
BUN: 45 mg/dL — ABNORMAL HIGH (ref 6–20)
CHLORIDE: 104 mmol/L (ref 101–111)
CO2: 27 mmol/L (ref 22–32)
Calcium: 8.6 mg/dL — ABNORMAL LOW (ref 8.9–10.3)
Creatinine, Ser: 2.6 mg/dL — ABNORMAL HIGH (ref 0.44–1.00)
GFR, EST AFRICAN AMERICAN: 19 mL/min — AB (ref >60)
GFR, EST NON AFRICAN AMERICAN: 16 mL/min — AB (ref >60)
Glucose, Bld: 90 mg/dL (ref 65–99)
POTASSIUM: 4.7 mmol/L (ref 3.5–5.1)
SODIUM: 141 mmol/L (ref 135–145)

## 2015-06-29 LAB — CBC WITH DIFFERENTIAL/PLATELET
BASOS ABS: 0 10*3/uL (ref 0.0–0.1)
Basophils Relative: 0 %
Eosinophils Absolute: 0.1 10*3/uL (ref 0.0–0.7)
Eosinophils Relative: 3 %
HCT: 27.4 % — ABNORMAL LOW (ref 36.0–46.0)
HEMOGLOBIN: 8.5 g/dL — AB (ref 12.0–15.0)
Lymphocytes Relative: 8 %
Lymphs Abs: 0.3 10*3/uL — ABNORMAL LOW (ref 0.7–4.0)
MCH: 27.2 pg (ref 26.0–34.0)
MCHC: 31 g/dL (ref 30.0–36.0)
MCV: 87.8 fL (ref 78.0–100.0)
Monocytes Absolute: 0.4 10*3/uL (ref 0.1–1.0)
Monocytes Relative: 10 %
NEUTROS PCT: 80 %
Neutro Abs: 3.4 10*3/uL (ref 1.7–7.7)
PLATELETS: 236 10*3/uL (ref 150–400)
RBC: 3.12 MIL/uL — AB (ref 3.87–5.11)
RDW: 17.1 % — ABNORMAL HIGH (ref 11.5–15.5)
WBC: 4.3 10*3/uL (ref 4.0–10.5)

## 2015-06-29 LAB — URINALYSIS, ROUTINE W REFLEX MICROSCOPIC
Bilirubin Urine: NEGATIVE
Glucose, UA: NEGATIVE mg/dL
HGB URINE DIPSTICK: NEGATIVE
Ketones, ur: NEGATIVE mg/dL
NITRITE: NEGATIVE
PROTEIN: 100 mg/dL — AB
SPECIFIC GRAVITY, URINE: 1.014 (ref 1.005–1.030)
pH: 5 (ref 5.0–8.0)

## 2015-06-29 LAB — URINE MICROSCOPIC-ADD ON

## 2015-06-29 LAB — CBG MONITORING, ED
GLUCOSE-CAPILLARY: 80 mg/dL (ref 65–99)
GLUCOSE-CAPILLARY: 102 mg/dL — AB (ref 65–99)
GLUCOSE-CAPILLARY: 103 mg/dL — AB (ref 65–99)
Glucose-Capillary: 87 mg/dL (ref 65–99)

## 2015-06-29 MED ORDER — SODIUM CHLORIDE 0.9 % IV BOLUS (SEPSIS)
500.0000 mL | Freq: Once | INTRAVENOUS | Status: AC
  Administered 2015-06-29: 500 mL via INTRAVENOUS

## 2015-06-29 MED ORDER — DEXTROSE 5 % IV SOLN
1.0000 g | Freq: Once | INTRAVENOUS | Status: AC
  Administered 2015-06-29: 1 g via INTRAVENOUS
  Filled 2015-06-29: qty 10

## 2015-06-29 MED ORDER — CEPHALEXIN 500 MG PO CAPS: 500.0000 mg | ORAL_CAPSULE | Freq: Four times a day (QID) | ORAL | Status: DC

## 2015-06-29 NOTE — ED Notes (Signed)
Per ems, patient's blood glucose was 23, and patient was unresponsive. 1amp of d50 given in 20g IV in right hand. Repeat cbg is 229. 3rd ems run within the last 24 hours for low blood sugar. Son has been treating at home. Patient takes glipizide 5mg  xl. Patient alert on arrival. Always wears 3L nasal cannula. Patient is from home with hx of copd and chf.

## 2015-06-29 NOTE — ED Provider Notes (Signed)
CHIEF COMPLAINT: Hypoglycemia  HPI: Pt is a 82 y.o. female with history of hypertension, CHF, COPD on home oxygen, paroxysmal atrial fibrillation on Coumadin, chronic kidney disease, diabetes on glipizide who presents to the emergency department with hypoglycemia. Patient's son reports that she has had multiple episodes of hypoglycemia as night and early this morning. He states that he has come to call EMS to his house 3 separate times. Reports every time they fix her glucose she refuses transport until this time he finally convinced her to come to the hospital. States she last took her glipizide at 6 PM yesterday night. Has not had any changes in her medications recently. Is not on insulin. Denies taking extra medication. Reports she has been eating and drinking normally. No fevers, cough, chest pain or shortness of breath, vomiting or diarrhea, dysuria or hematuria. States she's been feeling well without complaints.  ROS: See HPI Constitutional: no fever  Eyes: no drainage  ENT: no runny nose   Cardiovascular:  no chest pain  Resp: no SOB  GI: no vomiting GU: no dysuria Integumentary: no rash  Allergy: no hives  Musculoskeletal: no leg swelling  Neurological: no slurred speech ROS otherwise negative  PAST MEDICAL HISTORY/PAST SURGICAL HISTORY:  Past Medical History  Diagnosis Date  . Hypertension   . Neuropathy (HCC)   . Anxiety   . PONV (postoperative nausea and vomiting)   . CHF (congestive heart failure) (HCC)   . COPD (chronic obstructive pulmonary disease) (HCC)     on home, nocturnal oxygen.   . Family history of adverse reaction to anesthesia   . Cardiomyopathy (HCC)   . Diabetes mellitus without complication (HCC)     TYPE 2  . Macular degeneration, bilateral   . Varicose veins   . GIB (gastrointestinal bleeding)   . Paroxysmal atrial fibrillation (HCC)   . Shortness of breath dyspnea   . Acute on chronic diastolic heart failure (HCC) 12/22/2014    MEDICATIONS:   Prior to Admission medications   Medication Sig Start Date End Date Taking? Authorizing Provider  albuterol (PROVENTIL HFA;VENTOLIN HFA) 108 (90 BASE) MCG/ACT inhaler Inhale 2 puffs into the lungs every 6 (six) hours as needed for wheezing or shortness of breath. 06/11/13   Nishant Dhungel, MD  aspirin EC 81 MG tablet Take 81 mg by mouth every 6 (six) hours as needed for moderate pain.    Historical Provider, MD  atorvastatin (LIPITOR) 20 MG tablet Take 20 mg by mouth at bedtime. 10/12/14 10/12/15  Historical Provider, MD  cholecalciferol (VITAMIN D) 1000 UNITS tablet Take 1,000 Units by mouth every morning.     Historical Provider, MD  escitalopram (LEXAPRO) 10 MG tablet Take 10 mg by mouth daily.    Historical Provider, MD  fenofibrate 160 MG tablet Take 160 mg by mouth daily.    Historical Provider, MD  gabapentin (NEURONTIN) 100 MG capsule Take 1 capsule (100 mg total) by mouth daily. 06/15/15   Rhetta Mura, MD  glipiZIDE (GLUCOTROL XL) 5 MG 24 hr tablet Take 5 mg by mouth daily with breakfast.    Historical Provider, MD  hydrALAZINE (APRESOLINE) 50 MG tablet Take 1 tablet (50 mg total) by mouth every 12 (twelve) hours. 06/23/15   Meredeth Ide, MD  isosorbide mononitrate (IMDUR) 30 MG 24 hr tablet TAKE 1 TABLET BY MOUTH EVERY DAY 01/31/15   Chrystie Nose, MD  methimazole (TAPAZOLE) 5 MG tablet Take 1 tablet (5 mg total) by mouth 3 (three) times a  week. Patient taking differently: Take 5 mg by mouth daily.  01/23/15   Romero Belling, MD  metoprolol tartrate (LOPRESSOR) 25 MG tablet TAKE 1 TABLET BY MOUTH TWICE A DAY 02/27/15   Chrystie Nose, MD  Multiple Vitamin (MULTIVITAMIN WITH MINERALS) TABS tablet Take 1 tablet by mouth daily.    Historical Provider, MD  OXYGEN Inhale 3 L into the lungs at bedtime.     Historical Provider, MD  potassium chloride SA (K-DUR,KLOR-CON) 20 MEQ tablet Take 1 tablet (20 mEq total) by mouth daily. 12/25/14   Lora Paula, MD  torsemide (DEMADEX) 20 MG tablet  Take 1 tablet (20 mg total) by mouth 2 (two) times daily. 06/15/15   Rhetta Mura, MD  warfarin (COUMADIN) 2.5 MG tablet Take 1 tablet (2.5 mg total) by mouth daily at 6 PM. 06/23/15   Meredeth Ide, MD    ALLERGIES:  Allergies  Allergen Reactions  . Yellow Dyes (Non-Tartrazine) Nausea And Vomiting    "deathly allergic" No other information given to explain reaction.  . Codeine Hives  . Other Other (See Comments)    novocaine broke mouth out  . Penicillins Hives  . Procaine Hives    SOCIAL HISTORY:  Social History  Substance Use Topics  . Smoking status: Passive Smoke Exposure - Never Smoker  . Smokeless tobacco: Never Used  . Alcohol Use: No    FAMILY HISTORY: Family History  Problem Relation Age of Onset  . Colon cancer Father 71    advanced when discovered, no surgery or therapy.  this led to his death  . Breast cancer Sister   . Cancer Brother   . Breast cancer Mother   . Lung cancer Brother   . Thyroid disease Neg Hx     EXAM: BP 122/46 mmHg  Pulse 54  Temp(Src) 97.4 F (36.3 C) (Oral)  Resp 16  Ht  (1.575 m)  Wt 140 lb (63.504 kg)  BMI 25.60 kg/m2  SpO2 99% CONSTITUTIONAL: Alert and oriented and responds appropriately to questions. Elderly, chronically ill-appearing, in no distress HEAD: Normocephalic EYES: Conjunctivae clear, PERRL ENT: normal nose; no rhinorrhea; moist mucous membranes; pharynx without lesions noted NECK: Supple, no meningismus, no LAD  CARD: RRR; S1 and S2 appreciated; no murmurs, no clicks, no rubs, no gallops RESP: Normal chest excursion without splinting or tachypnea; breath sounds clear and equal bilaterally; no wheezes, no rhonchi, no rales, no hypoxia or respiratory distress, speaking full sentences, chronically on oxygen ABD/GI: Normal bowel sounds; non-distended; soft, non-tender, no rebound, no guarding, no peritoneal signs BACK:  The back appears normal and is non-tender to palpation, there is no CVA tenderness EXT:  Normal ROM in all joints; non-tender to palpation; no edema; normal capillary refill; no cyanosis, no calf tenderness or swelling    SKIN: Normal color for age and race; warm NEURO: Moves all extremities equally, sensation to light touch intact diffusely, cranial nerves II through XII intact PSYCH: The patient's mood and manner are appropriate. Grooming and personal hygiene are appropriate.  MEDICAL DECISION MAKING: Patient here with recurrent hypoglycemia. Suspect this is from her glipizide. She has been monitored for several hours and her blood glucose has been stable, increasing slightly. She's been able to eat and drink without difficulty. Her labs show chronic anemia which is unchanged. She has mildly elevated creatinine from her baseline and has received a small amount of IV hydration. Urine does show large leukocytes and bacteria. Culture is pending. Discussed with patient and son  that this may be a urinary tract infection control being to some of her symptoms. Given ceftriaxone in the emergency part. Will discharge on Keflex. I feel given her last dose of glipizide was over 12 hours ago and her blood glucose has been stable that she can be discharged home. Son agrees to watch her blood glucose closely at home. They will contact her primary care provider for recommendations for what to do with her glipizide today. Have advised them if they're unable to get in touch with her primary care provider that they should take only half of her glipizide for the next several days until he can get in to see the PCP. Discussed return precautions. They verbalize understanding and are comfortable with this plan.       Layla Maw Gennesis Hogland, DO 06/29/15 [phone]

## 2015-06-29 NOTE — ED Notes (Signed)
Checked patient cbg it was 29 notified RN Haden of blood sugar

## 2015-06-29 NOTE — Discharge Instructions (Signed)
Blood Glucose Monitoring, Adult Monitoring your blood glucose (also know as blood sugar) helps you to manage your diabetes. It also helps you and your health care provider monitor your diabetes and determine how well your treatment plan is working. WHY SHOULD YOU MONITOR YOUR BLOOD GLUCOSE?  It can help you understand how food, exercise, and medicine affect your blood glucose.  It allows you to know what your blood glucose is at any given moment. You can quickly tell if you are having low blood glucose (hypoglycemia) or high blood glucose (hyperglycemia).  It can help you and your health care provider know how to adjust your medicines.  It can help you understand how to manage an illness or adjust medicine for exercise. WHEN SHOULD YOU TEST? Your health care provider will help you decide how often you should check your blood glucose. This may depend on the type of diabetes you have, your diabetes control, or the types of medicines you are taking. Be sure to write down all of your blood glucose readings so that this information can be reviewed with your health care provider. See below for examples of testing times that your health care provider may suggest. Type 1 Diabetes  Test at least 2 times per day if your diabetes is well controlled, if you are using an insulin pump, or if you perform multiple daily injections.  If your diabetes is not well controlled or if you are sick, you may need to test more often.  It is a good idea to also test:  Before every insulin injection.  Before and after exercise.  Between meals and 2 hours after a meal.  Occasionally between 2:00 a.m. and 3:00 a.m. Type 2 Diabetes  If you are taking insulin, test at least 2 times per day. However, it is best to test before every insulin injection.  If you take medicines by mouth (orally), test 2 times a day.  If you are on a controlled diet, test once a day.  If your diabetes is not well controlled or if you  are sick, you may need to monitor more often. HOW TO MONITOR YOUR BLOOD GLUCOSE Supplies Needed  Blood glucose meter.  Test strips for your meter. Each meter has its own strips. You must use the strips that go with your own meter.  A pricking needle (lancet).  A device that holds the lancet (lancing device).  A journal or log book to write down your results. Procedure  Wash your hands with soap and water. Alcohol is not preferred.  Prick the side of your finger (not the tip) with the lancet.  Gently milk the finger until a small drop of blood appears.  Follow the instructions that come with your meter for inserting the test strip, applying blood to the strip, and using your blood glucose meter. Other Areas to Get Blood for Testing Some meters allow you to use other areas of your body (other than your finger) to test your blood. These areas are called alternative sites. The most common alternative sites are:  The forearm.  The thigh.  The back area of the lower leg.  The palm of the hand. The blood flow in these areas is slower. Therefore, the blood glucose values you get may be delayed, and the numbers are different from what you would get from your fingers. Do not use alternative sites if you think you are having hypoglycemia. Your reading will not be accurate. Always use a finger if you are  having hypoglycemia. Also, if you cannot feel your lows (hypoglycemia unawareness), always use your fingers for your blood glucose checks. ADDITIONAL TIPS FOR GLUCOSE MONITORING  Do not reuse lancets.  Always carry your supplies with you.  All blood glucose meters have a 24-hour "hotline" number to call if you have questions or need help.  Adjust (calibrate) your blood glucose meter with a control solution after finishing a few boxes of strips. BLOOD GLUCOSE RECORD KEEPING It is a good idea to keep a daily record or log of your blood glucose readings. Most glucose meters, if not all,  keep your glucose records stored in the meter. Some meters come with the ability to download your records to your home computer. Keeping a record of your blood glucose readings is especially helpful if you are wanting to look for patterns. Make notes to go along with the blood glucose readings because you might forget what happened at that exact time. Keeping good records helps you and your health care provider to work together to achieve good diabetes management.    This information is not intended to replace advice given to you by your health care provider. Make sure you discuss any questions you have with your health care provider.   Document Released: 05/16/2003 Document Revised: 06/03/2014 Document Reviewed: 10/05/2012 Elsevier Interactive Patient Education 2016 Else[employer] Inc.  Urinary Tract Infection Urinary tract infections (UTIs) can develop anywhere along your urinary tract. Your urinary tract is your body's drainage system for removing wastes and extra water. Your urinary tract includes two kidneys, two ureters, a bladder, and a urethra. Your kidneys are a pair of bean-shaped organs. Each kidney is about the size of your fist. They are located below your ribs, one on each side of your spine. CAUSES Infections are caused by microbes, which are microscopic organisms, including fungi, viruses, and bacteria. These organisms are so small that they can only be seen through a microscope. Bacteria are the microbes that most commonly cause UTIs. SYMPTOMS  Symptoms of UTIs may vary by age and gender of the patient and by the location of the infection. Symptoms in young women typically include a frequent and intense urge to urinate and a painful, burning feeling in the bladder or urethra during urination. Older women and men are more likely to be tired, shaky, and weak and have muscle aches and abdominal pain. A fever may mean the infection is in your kidneys. Other symptoms of a kidney infection include  pain in your back or sides below the ribs, nausea, and vomiting. DIAGNOSIS To diagnose a UTI, your caregiver will ask you about your symptoms. Your caregiver will also ask you to provide a urine sample. The urine sample will be tested for bacteria and white blood cells. White blood cells are made by your body to help fight infection. TREATMENT  Typically, UTIs can be treated with medication. Because most UTIs are caused by a bacterial infection, they usually can be treated with the use of antibiotics. The choice of antibiotic and length of treatment depend on your symptoms and the type of bacteria causing your infection. HOME CARE INSTRUCTIONS  If you were prescribed antibiotics, take them exactly as your caregiver instructs you. Finish the medication even if you feel better after you have only taken some of the medication.  Drink enough water and fluids to keep your urine clear or pale yellow.  Avoid caffeine, tea, and carbonated beverages. They tend to irritate your bladder.  Empty your bladder often. Avoid  holding urine for long periods of time.  Empty your bladder before and after sexual intercourse.  After a bowel movement, women should cleanse from front to back. Use each tissue only once. SEEK MEDICAL CARE IF:   You have back pain.  You develop a fever.  Your symptoms do not begin to resolve within 3 days. SEEK IMMEDIATE MEDICAL CARE IF:   You have severe back pain or lower abdominal pain.  You develop chills.  You have nausea or vomiting.  You have continued burning or discomfort with urination. MAKE SURE YOU:   Understand these instructions.  Will watch your condition.  Will get help right away if you are not doing well or get worse.   This information is not intended to replace advice given to you by your health care provider. Make sure you discuss any questions you have with your health care provider.   Document Released: 02/20/2005 Document Revised: 02/01/2015  Document Reviewed: 06/21/2011 Elsevier Interactive Patient Education 2016 Elsevier Inc.  Hypoglycemia Hypoglycemia occurs when the glucose in your blood is too low. Glucose is a type of sugar that is your body's main energy source. Hormones, such as insulin and glucagon, control the level of glucose in the blood. Insulin lowers blood glucose and glucagon increases blood glucose. Having too much insulin in your blood stream, or not eating enough food containing sugar, can result in hypoglycemia. Hypoglycemia can happen to people with or without diabetes. It can develop quickly and can be a medical emergency.  CAUSES   Missing or delaying meals.  Not eating enough carbohydrates at meals.  Taking too much diabetes medicine.  Not timing your oral diabetes medicine or insulin doses with meals, snacks, and exercise.  Nausea and vomiting.  Certain medicines.  Severe illnesses, such as hepatitis, kidney disorders, and certain eating disorders.  Increased activity or exercise without eating something extra or adjusting medicines.  Drinking too much alcohol.  A nerve disorder that affects body functions like your heart rate, blood pressure, and digestion (autonomic neuropathy).  A condition where the stomach muscles do not function properly (gastroparesis). Therefore, medicines and food may not absorb properly.  Rarely, a tumor of the pancreas can produce too much insulin. SYMPTOMS   Hunger.  Sweating (diaphoresis).  Change in body temperature.  Shakiness.  Headache.  Anxiety.  Lightheadedness.  Irritability.  Difficulty concentrating.  Dry mouth.  Tingling or numbness in the hands or feet.  Restless sleep or sleep disturbances.  Altered speech and coordination.  Change in mental status.  Seizures or prolonged convulsions.  Combativeness.  Drowsiness (lethargic).  Weakness.  Increased heart rate or palpitations.  Confusion.  Pale, gray skin  color.  Blurred or double vision.  Fainting. DIAGNOSIS  A physical exam and medical history will be performed. Your caregiver may make a diagnosis based on your symptoms. Blood tests and other lab tests may be performed to confirm a diagnosis. Once the diagnosis is made, your caregiver will see if your signs and symptoms go away once your blood glucose is raised.  TREATMENT  Usually, you can easily treat your hypoglycemia when you notice symptoms.  Check your blood glucose. If it is less than 70 mg/dl, take one of the following:   3-4 glucose tablets.    cup juice.    cup regular soda.   1 cup skim milk.   -1 tube of glucose gel.   5-6 hard candies.   Avoid high-fat drinks or food that may delay a rise  in blood glucose levels.  Do not take more than the recommended amount of sugary foods, drinks, gel, or tablets. Doing so will cause your blood glucose to go too high.   Wait 10-15 minutes and recheck your blood glucose. If it is still less than 70 mg/dl or below your target range, repeat treatment.   Eat a snack if it is more than 1 hour until your next meal.  There may be a time when your blood glucose may go so low that you are unable to treat yourself at home when you start to notice symptoms. You may need someone to help you. You may even faint or be unable to swallow. If you cannot treat yourself, someone will need to bring you to the hospital.  HOME CARE INSTRUCTIONS  If you have diabetes, follow your diabetes management plan by:  Taking your medicines as directed.  Following your exercise plan.  Following your meal plan. Do not skip meals. Eat on time.  Testing your blood glucose regularly. Check your blood glucose before and after exercise. If you exercise longer or different than usual, be sure to check blood glucose more frequently.  Wearing your medical alert jewelry that says you have diabetes.  Identify the cause of your hypoglycemia. Then,  develop ways to prevent the recurrence of hypoglycemia.  Do not take a hot bath or shower right after an insulin shot.  Always carry treatment with you. Glucose tablets are the easiest to carry.  If you are going to drink alcohol, drink it only with meals.  Tell friends or family members ways to keep you safe during a seizure. This may include removing hard or sharp objects from the area or turning you on your side.  Maintain a healthy weight. SEEK MEDICAL CARE IF:   You are having problems keeping your blood glucose in your target range.  You are having frequent episodes of hypoglycemia.  You feel you might be having side effects from your medicines.  You are not sure why your blood glucose is dropping so low.  You notice a change in vision or a new problem with your vision. SEEK IMMEDIATE MEDICAL CARE IF:   Confusion develops.  A change in mental status occurs.  The inability to swallow develops.  Fainting occurs.   This information is not intended to replace advice given to you by your health care provider. Make sure you discuss any questions you have with your health care provider.   Document Released: 05/13/2005 Document Revised: 05/18/2013 Document Reviewed: 01/17/2015 Elsevier Interactive Patient Education [employer]

## 2015-06-30 ENCOUNTER — Inpatient Hospital Stay (HOSPITAL_COMMUNITY)
Admission: EM | Admit: 2015-06-30 | Discharge: 2015-07-05 | DRG: 637 | Disposition: A | Discharge status: Skilled Nursing Facility | Payer: Medicare Other | Attending: Internal Medicine | Admitting: Internal Medicine

## 2015-06-30 ENCOUNTER — Encounter (HOSPITAL_COMMUNITY): Payer: Self-pay

## 2015-06-30 DIAGNOSIS — R0602 Shortness of breath: Secondary | ICD-10-CM

## 2015-06-30 DIAGNOSIS — N184 Chronic kidney disease, stage 4 (severe): Secondary | ICD-10-CM

## 2015-06-30 DIAGNOSIS — J438 Other emphysema: Secondary | ICD-10-CM

## 2015-06-30 DIAGNOSIS — I5033 Acute on chronic diastolic (congestive) heart failure: Secondary | ICD-10-CM | POA: Diagnosis present

## 2015-06-30 DIAGNOSIS — E11649 Type 2 diabetes mellitus with hypoglycemia without coma: Secondary | ICD-10-CM | POA: Diagnosis present

## 2015-06-30 DIAGNOSIS — I5032 Chronic diastolic (congestive) heart failure: Secondary | ICD-10-CM

## 2015-06-30 DIAGNOSIS — E0590 Thyrotoxicosis, unspecified without thyrotoxic crisis or storm: Secondary | ICD-10-CM

## 2015-06-30 DIAGNOSIS — J449 Chronic obstructive pulmonary disease, unspecified: Secondary | ICD-10-CM | POA: Diagnosis present

## 2015-06-30 DIAGNOSIS — N179 Acute kidney failure, unspecified: Secondary | ICD-10-CM | POA: Diagnosis present

## 2015-06-30 DIAGNOSIS — I10 Essential (primary) hypertension: Secondary | ICD-10-CM

## 2015-06-30 DIAGNOSIS — R600 Localized edema: Secondary | ICD-10-CM

## 2015-06-30 DIAGNOSIS — E162 Hypoglycemia, unspecified: Secondary | ICD-10-CM

## 2015-06-30 DIAGNOSIS — I480 Paroxysmal atrial fibrillation: Secondary | ICD-10-CM

## 2015-06-30 DIAGNOSIS — D649 Anemia, unspecified: Secondary | ICD-10-CM

## 2015-06-30 DIAGNOSIS — N390 Urinary tract infection, site not specified: Secondary | ICD-10-CM | POA: Diagnosis present

## 2015-06-30 DIAGNOSIS — J9611 Chronic respiratory failure with hypoxia: Secondary | ICD-10-CM | POA: Diagnosis not present

## 2015-06-30 DIAGNOSIS — S92402A Displaced unspecified fracture of left great toe, initial encounter for closed fracture: Secondary | ICD-10-CM | POA: Diagnosis present

## 2015-06-30 LAB — CBC WITH DIFFERENTIAL/PLATELET
BASOS PCT: 0 %
Basophils Absolute: 0 10*3/uL (ref 0.0–0.1)
Eosinophils Absolute: 0.1 10*3/uL (ref 0.0–0.7)
Eosinophils Relative: 2 %
HEMATOCRIT: 30.1 % — AB (ref 36.0–46.0)
Hemoglobin: 9 g/dL — ABNORMAL LOW (ref 12.0–15.0)
LYMPHS PCT: 6 %
Lymphs Abs: 0.3 10*3/uL — ABNORMAL LOW (ref 0.7–4.0)
MCH: 26.5 pg (ref 26.0–34.0)
MCHC: 29.9 g/dL — AB (ref 30.0–36.0)
MCV: 88.8 fL (ref 78.0–100.0)
MONO ABS: 0.5 10*3/uL (ref 0.1–1.0)
MONOS PCT: 9 %
NEUTROS ABS: 4.5 10*3/uL (ref 1.7–7.7)
Neutrophils Relative %: 83 %
Platelets: 252 10*3/uL (ref 150–400)
RBC: 3.39 MIL/uL — ABNORMAL LOW (ref 3.87–5.11)
RDW: 17.3 % — AB (ref 11.5–15.5)
WBC: 5.4 10*3/uL (ref 4.0–10.5)

## 2015-06-30 LAB — URINALYSIS, ROUTINE W REFLEX MICROSCOPIC
BILIRUBIN URINE: NEGATIVE
GLUCOSE, UA: NEGATIVE mg/dL
KETONES UR: NEGATIVE mg/dL
Nitrite: NEGATIVE
PH: 5 (ref 5.0–8.0)
Protein, ur: 100 mg/dL — AB
Specific Gravity, Urine: 1.015 (ref 1.005–1.030)

## 2015-06-30 LAB — GLUCOSE, CAPILLARY
GLUCOSE-CAPILLARY: 119 mg/dL — AB (ref 65–99)
GLUCOSE-CAPILLARY: 154 mg/dL — AB (ref 65–99)
Glucose-Capillary: 101 mg/dL — ABNORMAL HIGH (ref 65–99)
Glucose-Capillary: 92 mg/dL (ref 65–99)

## 2015-06-30 LAB — URINE MICROSCOPIC-ADD ON

## 2015-06-30 LAB — COMPREHENSIVE METABOLIC PANEL
ALBUMIN: 3.3 g/dL — AB (ref 3.5–5.0)
ALK PHOS: 49 U/L (ref 38–126)
ALT: 30 U/L (ref 14–54)
ANION GAP: 9 (ref 5–15)
AST: 54 U/L — AB (ref 15–41)
BUN: 46 mg/dL — AB (ref 6–20)
CALCIUM: 8.8 mg/dL — AB (ref 8.9–10.3)
CO2: 27 mmol/L (ref 22–32)
Chloride: 105 mmol/L (ref 101–111)
Creatinine, Ser: 2.68 mg/dL — ABNORMAL HIGH (ref 0.44–1.00)
GFR calc Af Amer: 18 mL/min — ABNORMAL LOW (ref >60)
GFR calc non Af Amer: 16 mL/min — ABNORMAL LOW (ref >60)
GLUCOSE: 78 mg/dL (ref 65–99)
Potassium: 4.8 mmol/L (ref 3.5–5.1)
SODIUM: 141 mmol/L (ref 135–145)
Total Bilirubin: 0.3 mg/dL (ref 0.3–1.2)
Total Protein: 6.2 g/dL — ABNORMAL LOW (ref 6.5–8.1)

## 2015-06-30 LAB — CBG MONITORING, ED
GLUCOSE-CAPILLARY: 98 mg/dL (ref 65–99)
Glucose-Capillary: 88 mg/dL (ref 65–99)
Glucose-Capillary: 101 mg/dL — ABNORMAL HIGH (ref 65–99)
Glucose-Capillary: 85 mg/dL (ref 65–99)

## 2015-06-30 LAB — URINE CULTURE

## 2015-06-30 LAB — PROTIME-INR
INR: 1.42 (ref 0.00–1.49)
Prothrombin Time: 17.4 s — ABNORMAL HIGH (ref 11.6–15.2)

## 2015-06-30 LAB — TSH: TSH: 2.261 u[IU]/mL (ref 0.350–4.500)

## 2015-06-30 LAB — T4, FREE: Free T4: 1.01 ng/dL (ref 0.61–1.12)

## 2015-06-30 MED ORDER — CEPHALEXIN 500 MG PO CAPS
500.0000 mg | ORAL_CAPSULE | Freq: Four times a day (QID) | ORAL | Status: DC
  Administered 2015-06-30 (×3): 500 mg via ORAL
  Filled 2015-06-30 (×3): qty 1

## 2015-06-30 MED ORDER — ISOSORBIDE MONONITRATE ER 30 MG PO TB24
30.0000 mg | ORAL_TABLET | Freq: Every day | ORAL | Status: DC
  Administered 2015-06-30 – 2015-07-05 (×6): 30 mg via ORAL
  Filled 2015-06-30 (×6): qty 1

## 2015-06-30 MED ORDER — VITAMIN D 1000 UNITS PO TABS
1000.0000 [IU] | ORAL_TABLET | Freq: Every morning | ORAL | Status: DC
  Administered 2015-06-30 – 2015-07-05 (×6): 1000 [IU] via ORAL
  Filled 2015-06-30 (×6): qty 1

## 2015-06-30 MED ORDER — FENOFIBRATE 160 MG PO TABS
160.0000 mg | ORAL_TABLET | Freq: Every day | ORAL | Status: DC
  Administered 2015-06-30 – 2015-07-05 (×6): 160 mg via ORAL
  Filled 2015-06-30 (×6): qty 1

## 2015-06-30 MED ORDER — ADULT MULTIVITAMIN W/MINERALS CH
1.0000 | ORAL_TABLET | Freq: Every day | ORAL | Status: DC
  Administered 2015-06-30 – 2015-07-05 (×6): 1 via ORAL
  Filled 2015-06-30 (×6): qty 1

## 2015-06-30 MED ORDER — HYDRALAZINE HCL 50 MG PO TABS
50.0000 mg | ORAL_TABLET | Freq: Two times a day (BID) | ORAL | Status: DC
  Administered 2015-06-30 – 2015-07-05 (×11): 50 mg via ORAL
  Filled 2015-06-30 (×11): qty 1

## 2015-06-30 MED ORDER — METHIMAZOLE 5 MG PO TABS
5.0000 mg | ORAL_TABLET | Freq: Every day | ORAL | Status: DC
  Administered 2015-06-30 – 2015-07-05 (×5): 5 mg via ORAL
  Filled 2015-06-30 (×5): qty 1
  Filled 2015-06-30: qty 0.5

## 2015-06-30 MED ORDER — WARFARIN - PHARMACIST DOSING INPATIENT: Freq: Every day | Status: DC

## 2015-06-30 MED ORDER — SODIUM CHLORIDE 0.9 % IV SOLN
INTRAVENOUS | Status: DC
  Administered 2015-06-30: 13:00:00 via INTRAVENOUS

## 2015-06-30 MED ORDER — ONDANSETRON HCL 4 MG/2ML IJ SOLN: 4.0000 mg | Freq: Four times a day (QID) | INTRAMUSCULAR | Status: DC | PRN

## 2015-06-30 MED ORDER — GABAPENTIN 100 MG PO CAPS
100.0000 mg | ORAL_CAPSULE | Freq: Every day | ORAL | Status: DC
  Administered 2015-06-30 – 2015-07-05 (×6): 100 mg via ORAL
  Filled 2015-06-30 (×7): qty 1

## 2015-06-30 MED ORDER — METOPROLOL TARTRATE 25 MG PO TABS
25.0000 mg | ORAL_TABLET | Freq: Two times a day (BID) | ORAL | Status: DC
Start: 2015-06-30 — End: 2015-07-05
  Administered 2015-06-30 – 2015-07-05 (×11): 25 mg via ORAL
  Filled 2015-06-30 (×11): qty 1

## 2015-06-30 MED ORDER — ATORVASTATIN CALCIUM 20 MG PO TABS
20.0000 mg | ORAL_TABLET | Freq: Every day | ORAL | Status: DC
Start: 2015-06-30 — End: 2015-07-05
  Administered 2015-06-30 – 2015-07-04 (×5): 20 mg via ORAL
  Filled 2015-06-30 (×5): qty 1

## 2015-06-30 MED ORDER — ASPIRIN EC 81 MG PO TBEC
81.0000 mg | DELAYED_RELEASE_TABLET | Freq: Four times a day (QID) | ORAL | Status: DC | PRN
Start: 2015-06-30 — End: 2015-07-05

## 2015-06-30 MED ORDER — ALBUTEROL SULFATE (2.5 MG/3ML) 0.083% IN NEBU
2.5000 mg | INHALATION_SOLUTION | Freq: Four times a day (QID) | RESPIRATORY_TRACT | Status: DC | PRN
  Administered 2015-07-01: 2.5 mg via RESPIRATORY_TRACT
  Filled 2015-06-30: qty 3

## 2015-06-30 MED ORDER — ACETAMINOPHEN 325 MG PO TABS: 650.0000 mg | ORAL_TABLET | Freq: Four times a day (QID) | ORAL | Status: DC | PRN

## 2015-06-30 MED ORDER — WARFARIN SODIUM 2 MG PO TABS
4.0000 mg | ORAL_TABLET | Freq: Once | ORAL | Status: AC
  Administered 2015-06-30: 4 mg via ORAL
  Filled 2015-06-30: qty 2

## 2015-06-30 MED ORDER — ACETAMINOPHEN 650 MG RE SUPP: 650.0000 mg | Freq: Four times a day (QID) | RECTAL | Status: DC | PRN

## 2015-06-30 MED ORDER — INSULIN ASPART 100 UNIT/ML ~~LOC~~ SOLN
0.0000 [IU] | SUBCUTANEOUS | Status: DC
  Administered 2015-06-30 – 2015-07-01 (×3): 2 [IU] via SUBCUTANEOUS
  Administered 2015-07-02: 3 [IU] via SUBCUTANEOUS
  Administered 2015-07-02: 7 [IU] via SUBCUTANEOUS
  Administered 2015-07-02 (×2): 2 [IU] via SUBCUTANEOUS
  Administered 2015-07-03 (×2): 1 [IU] via SUBCUTANEOUS
  Administered 2015-07-03 (×2): 2 [IU] via SUBCUTANEOUS
  Administered 2015-07-03: 1 [IU] via SUBCUTANEOUS
  Administered 2015-07-04 (×3): 2 [IU] via SUBCUTANEOUS
  Administered 2015-07-05: 1 [IU] via SUBCUTANEOUS
  Administered 2015-07-05: 2 [IU] via SUBCUTANEOUS
  Administered 2015-07-05: 1 [IU] via SUBCUTANEOUS

## 2015-06-30 MED ORDER — SODIUM CHLORIDE 0.9 % IV SOLN
INTRAVENOUS | Status: DC
  Administered 2015-06-30: 15:00:00 via INTRAVENOUS

## 2015-06-30 MED ORDER — ESCITALOPRAM OXALATE 10 MG PO TABS
10.0000 mg | ORAL_TABLET | Freq: Every day | ORAL | Status: DC
  Administered 2015-06-30 – 2015-07-05 (×6): 10 mg via ORAL
  Filled 2015-06-30 (×6): qty 1

## 2015-06-30 MED ORDER — ONDANSETRON HCL 4 MG PO TABS: 4.0000 mg | ORAL_TABLET | Freq: Four times a day (QID) | ORAL | Status: DC | PRN

## 2015-06-30 NOTE — ED Notes (Addendum)
Pt given peanut butter crackers and orange juice, per verbal request of [place], [place]. Pt very lethargic.

## 2015-06-30 NOTE — ED Provider Notes (Signed)
CSN: [id number]     Arrival date & time 06/30/15  0818 History   First MD Initiated Contact with Patient 06/30/15 0830     Chief Complaint  Patient presents with  . Hypoglycemia   (Consider location/radiation/quality/duration/timing/severity/associated sxs/prior Treatment) HPI 82 y.o. female with a hx of HTN, CHF, Paroxysmal Afib on Coumadin, COPD, on Home O2 2LNC, CKD, DM on Glipizide presents to the Emergency Department today due to unresponsives this morning. She was seen here yesterday for similar episode of hypoglycemia. States that she took her glipizide the day before yesterday. No recent changes in medication. Normal PO intake. EMS reported glucose of 35 at bedside. Given 1 amp D50. No fevers, no CP/SOB/ABD pain, no N/V/D, no dysuria/hematuria. No other symptoms at this time.     Past Medical History  Diagnosis Date  . Hypertension   . Neuropathy (HCC)   . Anxiety   . PONV (postoperative nausea and vomiting)   . CHF (congestive heart failure) (HCC)   . COPD (chronic obstructive pulmonary disease) (HCC)     on home, nocturnal oxygen.   . Family history of adverse reaction to anesthesia   . Cardiomyopathy (HCC)   . Diabetes mellitus without complication (HCC)     TYPE 2  . Macular degeneration, bilateral   . Varicose veins   . GIB (gastrointestinal bleeding)   . Paroxysmal atrial fibrillation (HCC)   . Shortness of breath dyspnea   . Acute on chronic diastolic heart failure (HCC) 12/22/2014   Past Surgical History  Procedure Laterality Date  . Hip fracture surgery  2004  . Abdominal hysterectomy    . Vein surgery    . Esophagogastroduodenoscopy N/A 12/01/2014    Procedure: ESOPHAGOGASTRODUODENOSCOPY (EGD);  Surgeon: Hilarie Fredrickson, MD;  Location: [facility] ENDOSCOPY;  Service: Endoscopy;  Laterality: N/A;   Family History  Problem Relation Age of Onset  . Colon cancer Father 2    advanced when discovered, no surgery or therapy.  this led to his death  . Breast cancer Sister   .  Cancer Brother   . Breast cancer Mother   . Lung cancer Brother   . Thyroid disease Neg Hx    Social History  Substance Use Topics  . Smoking status: Passive Smoke Exposure - Never Smoker  . Smokeless tobacco: Never Used  . Alcohol Use: No   OB History    No data available     Review of Systems ROS reviewed and all are negative for acute change except as noted in the HPI.  Allergies  Yellow dyes (non-tartrazine); Codeine; Other; Penicillins; and Procaine  Home Medications   Prior to Admission medications   Medication Sig Start Date End Date Taking? Authorizing Provider  albuterol (PROVENTIL HFA;VENTOLIN HFA) 108 (90 BASE) MCG/ACT inhaler Inhale 2 puffs into the lungs every 6 (six) hours as needed for wheezing or shortness of breath. 06/11/13   Nishant Dhungel, MD  aspirin EC 81 MG tablet Take 81 mg by mouth every 6 (six) hours as needed for moderate pain.    Historical Provider, MD  atorvastatin (LIPITOR) 20 MG tablet Take 20 mg by mouth at bedtime. 10/12/14 10/12/15  Historical Provider, MD  cephALEXin (KEFLEX) 500 MG capsule Take 1 capsule (500 mg total) by mouth 4 (four) times daily. 06/29/15   Kristen N Ward, DO  cholecalciferol (VITAMIN D) 1000 UNITS tablet Take 1,000 Units by mouth every morning.     Historical Provider, MD  escitalopram (LEXAPRO) 10 MG tablet Take 10 mg  by mouth daily.    Historical Provider, MD  fenofibrate 160 MG tablet Take 160 mg by mouth daily.    Historical Provider, MD  gabapentin (NEURONTIN) 100 MG capsule Take 1 capsule (100 mg total) by mouth daily. 06/15/15   Rhetta Mura, MD  glipiZIDE (GLUCOTROL XL) 5 MG 24 hr tablet Take 5 mg by mouth daily with breakfast.    Historical Provider, MD  hydrALAZINE (APRESOLINE) 50 MG tablet Take 1 tablet (50 mg total) by mouth every 12 (twelve) hours. 06/23/15   Meredeth Ide, MD  isosorbide mononitrate (IMDUR) 30 MG 24 hr tablet TAKE 1 TABLET BY MOUTH EVERY DAY 01/31/15   Chrystie Nose, MD  methimazole (TAPAZOLE)  5 MG tablet Take 1 tablet (5 mg total) by mouth 3 (three) times a week. Patient taking differently: Take 5 mg by mouth daily.  01/23/15   Romero Belling, MD  metoprolol tartrate (LOPRESSOR) 25 MG tablet TAKE 1 TABLET BY MOUTH TWICE A DAY 02/27/15   Chrystie Nose, MD  Multiple Vitamin (MULTIVITAMIN WITH MINERALS) TABS tablet Take 1 tablet by mouth daily.    Historical Provider, MD  OXYGEN Inhale 3 L into the lungs at bedtime.     Historical Provider, MD  potassium chloride SA (K-DUR,KLOR-CON) 20 MEQ tablet Take 1 tablet (20 mEq total) by mouth daily. 12/25/14   Lora Paula, MD  torsemide (DEMADEX) 20 MG tablet Take 1 tablet (20 mg total) by mouth 2 (two) times daily. 06/15/15   Rhetta Mura, MD  warfarin (COUMADIN) 2.5 MG tablet Take 1 tablet (2.5 mg total) by mouth daily at 6 PM. 06/23/15   Meredeth Ide, MD   BP 137/48 mmHg  Pulse 52  Temp(Src) 96.5 F (35.8 C) (Oral)  Resp 16  Ht 5\' 2"  (1.575 m)  Wt 63.504 kg  BMI 25.60 kg/m2  SpO2 100%   Physical Exam  Constitutional: She is oriented to person, place, and time. She appears well-developed and well-nourished.  HENT:  Head: Normocephalic and atraumatic.  Eyes: EOM are normal.  Neck: Normal range of motion. Neck supple.  Cardiovascular: Normal rate, regular rhythm and normal heart sounds.   Pulmonary/Chest: Effort normal and breath sounds normal.  Abdominal: Soft. Bowel sounds are normal. There is no tenderness.  Musculoskeletal: Normal range of motion.  Neurological: She is alert and oriented to person, place, and time. She has normal strength. No cranial nerve deficit or sensory deficit.  Skin: Skin is warm and dry.  Psychiatric: She has a normal mood and affect. Her behavior is normal. Thought content normal.  Nursing note and vitals reviewed.  ED Course  Procedures (including critical care time) Labs Review Labs Reviewed  COMPREHENSIVE METABOLIC PANEL - Abnormal; Notable for the following:    BUN 46 (*)    Creatinine,  Ser 2.68 (*)    Calcium 8.8 (*)    Total Protein 6.2 (*)    Albumin 3.3 (*)    AST 54 (*)    GFR calc non Af Amer 16 (*)    GFR calc Af Amer 18 (*)    All other components within normal limits  CBC WITH DIFFERENTIAL/PLATELET - Abnormal; Notable for the following:    RBC 3.39 (*)    Hemoglobin 9.0 (*)    HCT 30.1 (*)    MCHC 29.9 (*)    RDW 17.3 (*)    Lymphs Abs 0.3 (*)    All other components within normal limits  CBG MONITORING, ED - Abnormal;  Notable for the following:    Glucose-Capillary 101 (*)    All other components within normal limits  URINALYSIS, ROUTINE W REFLEX MICROSCOPIC (NOT AT [facility])  TSH  T4, FREE  T4  CBG MONITORING, ED   Imaging Review No results found. I have personally reviewed and evaluated these images and lab results as part of my medical decision-making.   EKG Interpretation None     MDM  I have reviewed relevant laboratory values. I have reviewed relevant imaging studies. I personally interpreted the relevant EKG. I have reviewed the relevant previous healthcare records. I have reviewed EMS Documentation.I obtained HPI from historian.  Patient discussed with supervising physician  ED Course:  Assessment: 27y F with pmh HTN, CHF, Paroxysmal Afib on Coumadin, COPD, on Home O2 2LNC, CKD, DM on Glipizide presents for hypoglycemia with unresponsiveness this AM. Seen yesterday for similar complaint of Hypoglycemia. CBG 23 per EMS and given 1 amp D50 en route. Normally takes Glipizide 5mg  PO, but has not taken since Wednesday. She was set to see PCP today. Will do Thyroid workup due to bradycardia, hypothermia, hypoglycemia. Pt is alert and oriented. In NAD. Tolerating PO. No CP/SOB/ABD pain. Afebrile. No other symptoms noted.   Spoke with PCP Herold Harms PA) who agrees to discontinue Glipizide. She did note that she spoke with the patient's son and told them to DC the Glipizide previously.  Will Admit to medicine for observation and further workup for  Hypoglycemia.  Patient is in no acute distress. Vital Signs are stable.  Patient able to tolerate PO.   Disposition/Plan:  Admit Pt acknowledges and agrees with plan  Supervising Physician Marily Memos, MD   Final diagnoses:  Hypoglycemia      Audry Pili, PA-C 06/30/15 1209  Marily Memos, MD 07/01/15 1058

## 2015-06-30 NOTE — ED Notes (Signed)
Family and patient express concern about being sent home with same symptoms after seen here yesterday for same. Concerns expressed to MD and PA.

## 2015-06-30 NOTE — Progress Notes (Signed)
ANTICOAGULATION CONSULT NOTE - Initial Consult  Pharmacy Consult for Coumadin  Indication: atrial fibrillation  Allergies  Allergen Reactions  . Yellow Dyes (Non-Tartrazine) Nausea And Vomiting    "deathly allergic" No other information given to explain reaction.  . Codeine Hives  . Other Other (See Comments)    novocaine broke mouth out  . Penicillins Hives  . Procaine Hives    Patient Measurements: Height: 5\' 2"  (157.5 cm) Weight: 149 lb 1.6 oz (67.631 kg) IBW/kg (Calculated) : 50.1  Vital Signs: Temp: 98.8 F (37.1 C) (02/03 1338) Temp Source: Oral (02/03 1338) BP: 147/40 mmHg (02/03 1338) Pulse Rate: 58 (02/03 1338)  Labs:  Recent Labs  06/29/15 0610 06/30/15 0847 06/30/15 1410  HGB 8.5* 9.0*  --   HCT 27.4* 30.1*  --   PLT 236 252  --   LABPROT  --   --  17.4*  INR  --   --  1.42  CREATININE 2.60* 2.68*  --     Estimated Creatinine Clearance: 14.8 mL/min (by C-G formula based on Cr of 2.68).   Medical History: Past Medical History  Diagnosis Date  . Hypertension   . Neuropathy (HCC)   . Anxiety   . PONV (postoperative nausea and vomiting)   . CHF (congestive heart failure) (HCC)   . COPD (chronic obstructive pulmonary disease) (HCC)     on home, nocturnal oxygen.   . Family history of adverse reaction to anesthesia   . Cardiomyopathy (HCC)   . Diabetes mellitus without complication (HCC)     TYPE 2  . Macular degeneration, bilateral   . Varicose veins   . GIB (gastrointestinal bleeding)   . Paroxysmal atrial fibrillation (HCC)   . Shortness of breath dyspnea   . Acute on chronic diastolic heart failure (HCC) 12/22/2014   Assessment:  82 yr old female admitted with hypoglycemia.  To continue Coumadin for atrial fibrillation.  INR only 1.42 today.   Recent inpatient stay 1/24-1/26/17.   INRs were 1.32-1.44 during that admission.  Previously on Coumadin 1 mg daily, but increased to 2.5 mg daily at discharge 1/27.  Family reports that she's been  taking Coumadin 2.5 mg daily since that time. Last dose 2/2 pm.   Had heparin bridge 1/24-1/25 last admission; stopped due to Hgb 10>7.9. No bleeding.  Goal of Therapy:  INR 2-3 Monitor platelets by anticoagulation protocol: Yes   Plan:   Coumadin 4 mg x 1 today.  Daily PT/INR.  Intermittent CBC.  Dennie Fetters, [place] Pager: [phone] 06/30/2015,4:42 PM

## 2015-06-30 NOTE — ED Notes (Signed)
Pt given peanut butter crackers and orange juice.  

## 2015-06-30 NOTE — H&P (Signed)
[facility] History and Physical                                                                                    Alexis [name], is a 82 y.o. female  MRN: [id number]   DOB - 1932/05/04  Admit Date - 06/30/2015  Outpatient Primary MD for the patient is Elizabeth Palau, FNP  Referring MD: [place] / ER  PMH: Past Medical History  Diagnosis Date  . Hypertension   . Neuropathy (HCC)   . Anxiety   . PONV (postoperative nausea and vomiting)   . CHF (congestive heart failure) (HCC)   . COPD (chronic obstructive pulmonary disease) (HCC)     on home, nocturnal oxygen.   . Family history of adverse reaction to anesthesia   . Cardiomyopathy (HCC)   . Diabetes mellitus without complication (HCC)     TYPE 2  . Macular degeneration, bilateral   . Varicose veins   . GIB (gastrointestinal bleeding)   . Paroxysmal atrial fibrillation (HCC)   . Shortness of breath dyspnea   . Acute on chronic diastolic heart failure (HCC) 12/22/2014      PSH: Past Surgical History  Procedure Laterality Date  . Hip fracture surgery  2004  . Abdominal hysterectomy    . Vein surgery    . Esophagogastroduodenoscopy N/A 12/01/2014    Procedure: ESOPHAGOGASTRODUODENOSCOPY (EGD);  Surgeon: Hilarie Fredrickson, MD;  Location: [facility] ENDOSCOPY;  Service: Endoscopy;  Laterality: N/A;     CC:  Chief Complaint  Patient presents with  . Hypoglycemia     HPI: 82 year old female patient recently discharged on 1/27 admission for diastolic heart failure. Patient has underlying paroxysmal A. fib on warfarin, O2 dependent COPD (secondhand smoke exposure), chronic kidney disease stage IV diabetes on oral agents, hypertension, hyperthyroidism on Tapazole who presents to the hospital and the second time in the past 36 hours with symptomatic hypoglycemia. She initially presented to the ER on 2/2 after being found to have CBGs less than 30. Patient's son has been caring for his mother and has been attempting to feed her high  carbohydrate high-protein foods to keep her sugar up. EMS was called to the home 3 separate occasions in the past 24 hours prior to yesterday's visit and the patient refused transport once her glucose corrected. There are conflicting reports about when the patient last took her glipizide according to the ER physician's note from 2/2 the last dose had been on 2/1 at 6 PM. Other family members present today think the patient has not taken glipizide for at least 3-4 days. While being evaluated in the emergency department was determined patient had urinary tract infection and had begin a prescription for Keflex but this had not yet been filled. Over the past few weeks patient has been sleeping more frequently than usual and forgets to eat. At time she has forgotten foods on th the stove. Today on patient became less responsive and was noted her sugar was low again. EMS evaluated the patient at the home of her glucose was found to be 35 so she was given an amp of D50. No other symptoms associated. She was transported to [facility]  ER.  ER Evaluation and treatment: Oral temperature 96.5, BP 131/39, pulse 51, respirations 25, O2 sats 100% on 3 L oxygen EKG: Sinus bradycardia with ventricular rate 51 bpm, QTC 421 ms, normal R-wave progression, no ischemic changes, elevated J point in leads V5 and V6 somewhat in leads 1 and 2 Laboratory data: CBGs abdomen between 85 and 101 since arrival, BUN and creatinine 46/2.6 and at baseline with a GFR less than 20, albumin 3.3, AST 54, hemoglobin 9.0; repeat urinalysis with turbid-appearing urine, many bacteria, small hemoglobin, large leukocytes, nitrite negative, 100 protein, 6-30 RBCs, squamous epithelial 6-30, urine with yeast and WBCs too numerous to count, TSH 2.261 with free T4 1.01   Review of Systems   In addition to the HPI above,  No Fever-chills, myalgias or other constitutional symptoms No Headache, changes with Vision or hearing, new weakness, tingling, numbness in  any extremity, No problems swallowing food or Liquids, indigestion/reflux No Chest pain, Cough or Shortness of Breath, palpitations, orthopnea or DOE No Abdominal pain, N/V; no melena or hematochezia, no dark tarry stools No dysuria, hematuria or flank pain No new skin rashes, lesions, masses or bruises, No new joints pains-aches No recent weight gain or loss No polyuria, polydypsia or polyphagia,  *A full 10 point Review of Systems was done, except as stated above, all other Review of Systems were negative.  Social History Social History  Substance Use Topics  . Smoking status: Passive Smoke Exposure - Never Smoker  . Smokeless tobacco: Never Used  . Alcohol Use: No    Resides at: Private residence  Lives with: Son  Ambulatory status: Without assistive devices   Family History Family History  Problem Relation Age of Onset  . Colon cancer Father 78    advanced when discovered, no surgery or therapy.  this led to his death  . Breast cancer Sister   . Cancer Brother   . Breast cancer Mother   . Lung cancer Brother   . Thyroid disease Neg Hx      Prior to Admission medications   Medication Sig Start Date End Date Taking? Authorizing Provider  albuterol (PROVENTIL HFA;VENTOLIN HFA) 108 (90 BASE) MCG/ACT inhaler Inhale 2 puffs into the lungs every 6 (six) hours as needed for wheezing or shortness of breath. 06/11/13  Yes Nishant Dhungel, MD  aspirin EC 81 MG tablet Take 81 mg by mouth every 6 (six) hours as needed for moderate pain.   Yes Historical Provider, MD  atorvastatin (LIPITOR) 20 MG tablet Take 20 mg by mouth at bedtime. 10/12/14 10/12/15 Yes Historical Provider, MD  cholecalciferol (VITAMIN D) 1000 UNITS tablet Take 1,000 Units by mouth every morning.    Yes Historical Provider, MD  escitalopram (LEXAPRO) 10 MG tablet Take 10 mg by mouth daily.   Yes Historical Provider, MD  fenofibrate 160 MG tablet Take 160 mg by mouth daily.   Yes Historical Provider, MD    gabapentin (NEURONTIN) 100 MG capsule Take 1 capsule (100 mg total) by mouth daily. 06/15/15  Yes Rhetta Mura, MD  glipiZIDE (GLUCOTROL XL) 5 MG 24 hr tablet Take 5 mg by mouth daily with breakfast.   Yes Historical Provider, MD  hydrALAZINE (APRESOLINE) 50 MG tablet Take 1 tablet (50 mg total) by mouth every 12 (twelve) hours. 06/23/15  Yes Meredeth Ide, MD  isosorbide mononitrate (IMDUR) 30 MG 24 hr tablet TAKE 1 TABLET BY MOUTH EVERY DAY 01/31/15  Yes Chrystie Nose, MD  methimazole (TAPAZOLE) 5 MG tablet Take  1 tablet (5 mg total) by mouth 3 (three) times a week. Patient taking differently: Take 5 mg by mouth daily.  01/23/15  Yes Romero Belling, MD  metoprolol tartrate (LOPRESSOR) 25 MG tablet TAKE 1 TABLET BY MOUTH TWICE A DAY 02/27/15  Yes Chrystie Nose, MD  Multiple Vitamin (MULTIVITAMIN WITH MINERALS) TABS tablet Take 1 tablet by mouth daily.   Yes Historical Provider, MD  potassium chloride SA (K-DUR,KLOR-CON) 20 MEQ tablet Take 1 tablet (20 mEq total) by mouth daily. 12/25/14  Yes Lora Paula, MD  torsemide (DEMADEX) 20 MG tablet Take 1 tablet (20 mg total) by mouth 2 (two) times daily. 06/15/15  Yes Rhetta Mura, MD  warfarin (COUMADIN) 2.5 MG tablet Take 1 tablet (2.5 mg total) by mouth daily at 6 PM. 06/23/15  Yes Meredeth Ide, MD  cephALEXin (KEFLEX) 500 MG capsule Take 1 capsule (500 mg total) by mouth 4 (four) times daily. 06/29/15   Kristen N Ward, DO  OXYGEN Inhale 3 L into the lungs at bedtime.     Historical Provider, MD    Allergies  Allergen Reactions  . Yellow Dyes (Non-Tartrazine) Nausea And Vomiting    "deathly allergic" No other information given to explain reaction.  . Codeine Hives  . Other Other (See Comments)    novocaine broke mouth out  . Penicillins Hives  . Procaine Hives    Physical Exam  Vitals  Blood pressure 126/47, pulse 55, temperature 96.5 F (35.8 C), temperature source Oral, resp. rate 20, height 5\' 2"  (1.575 m), weight 140 lb  (63.504 kg), SpO2 99 %.   General:  In no acute distress, appears stated age  Psych:  Normal affect, Denies Suicidal or Homicidal ideations, Awake Alert, Oriented X 3. Speech and thought patterns are clear and appropriate  Neuro:   No focal neurological deficits, CN II through XII intact, Strength 5/5 all 4 extremities, Sensation intact all 4 extremities.  ENT:  Ears and Eyes appear Normal, Conjunctivae clear, PER. Moist oral mucosa without erythema or exudates.  Neck:  Supple, No lymphadenopathy appreciated  Respiratory:  Symmetrical chest wall movement, Good air movement bilaterally, CTAB. Room Air  Cardiac:  RRR, No Murmurs, no LE edema noted, no JVD, No carotid bruits, peripheral pulses palpable at 2+  Abdomen:  Positive bowel sounds, Soft, Non tender, Non distended,  No masses appreciated, no obvious hepatosplenomegaly  Skin:  No Cyanosis, Normal Skin Turgor, No Skin Rash or Bruise.  Extremities: Symmetrical,  no effusions, toe edematous with mild erythematous changes and abrasions on the dorsal surface but family reports this has markedly improved compared to when patient was last evaluated as an inpatient  Data Review  CBC  Recent Labs Lab 06/29/15 0610 06/30/15 0847  WBC 4.3 5.4  HGB 8.5* 9.0*  HCT 27.4* 30.1*  PLT 236 252  MCV 87.8 88.8  MCH 27.2 26.5  MCHC 31.0 29.9*  RDW 17.1* 17.3*  LYMPHSABS 0.3* 0.3*  MONOABS 0.4 0.5  EOSABS 0.1 0.1  BASOSABS 0.0 0.0    Chemistries   Recent Labs Lab 06/23/15 1230 06/23/15 1443 06/29/15 0610 06/30/15 0847  NA  --  143 141 141  K  --  5.0 4.7 4.8  CL  --  105 104 105  CO2  --  28 27 27   GLUCOSE  --  269* 90 78  BUN  --  39* 45* 46*  CREATININE  --  2.07* 2.60* 2.68*  CALCIUM  --  9.4 8.6* 8.8*  MG 2.0  --   --   --   AST  --   --   --  54*  ALT  --   --   --  30  ALKPHOS  --   --   --  49  BILITOT  --   --   --  0.3    estimated creatinine clearance is 14.4 mL/min (by C-G formula based on Cr of  2.68).   Recent Labs  06/30/15 0848  TSH 2.261    Coagulation profile No results for input(s): INR, PROTIME in the last 168 hours.  No results for input(s): DDIMER in the last 72 hours.  Cardiac Enzymes No results for input(s): CKMB, TROPONINI, MYOGLOBIN in the last 168 hours.  Invalid input(s): CK  Invalid input(s): POCBNP  Urinalysis    Component Value Date/Time   COLORURINE YELLOW 06/30/2015 1058   APPEARANCEUR TURBID* 06/30/2015 1058   LABSPEC 1.015 06/30/2015 1058   PHURINE 5.0 06/30/2015 1058   GLUCOSEU NEGATIVE 06/30/2015 1058   HGBUR SMALL* 06/30/2015 1058   BILIRUBINUR NEGATIVE 06/30/2015 1058   KETONESUR NEGATIVE 06/30/2015 1058   PROTEINUR 100* 06/30/2015 1058   UROBILINOGEN 0.2 12/14/2014 1821   NITRITE NEGATIVE 06/30/2015 1058   LEUKOCYTESUR LARGE* 06/30/2015 1058    Imaging results:   X-ray Chest Pa And Lateral  06/21/2015  CLINICAL DATA:  Follow-up pneumonia.  Subsequent encounter. EXAM: CHEST  2 VIEW COMPARISON:  Chest radiograph performed 06/20/2015 FINDINGS: Small bilateral pleural effusions are seen. Patchy bilateral airspace opacification likely reflects worsening pulmonary edema, though pneumonia cannot be excluded given clinical concern. Underlying vascular congestion is seen. No pneumothorax identified. The cardiomediastinal silhouette is enlarged. No acute osseous abnormalities identified. IMPRESSION: Small bilateral pleural effusions noted. Patchy bilateral airspace opacification likely reflects worsening pulmonary edema, though pneumonia cannot be excluded given clinical concern. Underlying vascular congestion and cardiomegaly. Electronically Signed   By: Roanna Raider M.D.   On: 06/21/2015 00:35   Dg Chest Port 1 View  06/20/2015  CLINICAL DATA:  Respiratory distress, shortness of breath, fluid overload, history CHF, asthma, COPD, diabetes mellitus, cardiomyopathy, hypertension, paroxysmal atrial fibrillation EXAM: PORTABLE CHEST 1 VIEW  COMPARISON:  Portable exam 1540 hours compared to 06/13/2015 FINDINGS: Enlargement of cardiac silhouette with pulmonary vascular congestion. Interstitial infiltrates bilaterally compatible with pulmonary edema and CHF, little changed when accounting for differences in technique. Suspect small RIGHT basilar pleural effusion. No pneumothorax. Bones demineralized. IMPRESSION: Persistent CHF and suspect small RIGHT pleural effusion, little changed. Electronically Signed   By: Ulyses Southward M.D.   On: 06/20/2015 16:18   Dg Chest Port 1 View  06/13/2015  CLINICAL DATA:  Sudden onset of shortness of breath, congestion P EXAM: PORTABLE CHEST 1 VIEW COMPARISON:  06/11/2015 FINDINGS: Cardiomegaly with vascular congestion and bilateral airspace opacities most compatible with edema/CHF. No significant effusions. No acute bony abnormality. IMPRESSION: Moderate CHF changes, stable since prior study. Electronically Signed   By: Charlett Nose M.D.   On: 06/13/2015 21:16   Dg Chest Portable 1 View  06/11/2015  CLINICAL DATA:  Shortness of breath EXAM: PORTABLE CHEST 1 VIEW COMPARISON:  12/22/2014 chest radiograph. FINDINGS: Stable cardiomediastinal silhouette with mild cardiomegaly. No pneumothorax. Likely small right pleural effusion. No left pleural effusion. Diffuse fluffy and linear parahilar opacities throughout both lungs, worsened in the interval, probably moderate pulmonary edema. IMPRESSION: Mild cardiomegaly with diffuse fluffy and linear parahilar opacities throughout both lungs, favor moderate pulmonary edema due to moderate congestive heart failure. Small right pleural  effusion. Electronically Signed   By: Delbert Phenix M.D.   On: 06/11/2015 17:57   Dg Foot Complete Left  06/21/2015  CLINICAL DATA:  82 year old female with injury to the left foot and big toe with soft tissue wound. Diabetic. Initial encounter. EXAM: LEFT FOOT - COMPLETE 3+ VIEW COMPARISON:  None. FINDINGS: Sip dressing material about the left great  toe. There is a mildly impacted but otherwise nondisplaced transverse fracture through the left big toe distal phalanx affecting the base, extra-articular. Great toe IP joint appears normal. First proximal phalanx and metatarsal are intact. MT P and IP joint subluxation elsewhere in the left foot. Probable healed fractures of the fourth and fifth proximal phalanges. No other acute fracture or dislocation identified. Pes planus. Small chronic appearing ossific fragments at the medial navicular and anterior inferior calcaneus. The calcaneus appears intact. No subcutaneous gas. IMPRESSION: Nondisplaced extra-articular fracture through the base of the left great toe distal phalanx. No other acute osseous abnormality identified. Electronically Signed   By: Odessa Fleming M.D.   On: 06/21/2015 10:25     EKG: (Independently reviewed) Sinus bradycardia with ventricular rate 51 bpm, QTC 421 ms, normal R-wave progression, no ischemic changes, elevated J point in leads V5 and V6 somewhat in leads 1 and 2    Assessment & Plan  Principal Problem:   Hypoglycemia associated with type 2 diabetes mellitus  -Etiology appears to be related to decreased clearance of oral hypoglycemic agent in patient with GFR less than 20 -Admit to floor/Obs -Permanently discontinue glyburide -Hemoglobin A1c last admission was 6.3 and patient can likely be controlled with diet management alone; if desire oral agent would recommend Januvia or Tradjenta -Follow CBGs every 4 hours/SSI  Active Problems:   CKD, stage IV  -Renal function stable and at baseline    UTI  -Begin Keflex prescribed on 2/2 -follow up on urine culture    Chronic diastolic heart failure, NYHA class 1  -Currently compensated -Patient actually appears somewhat volume depletion in setting of recurrent hypoglycemia and poor oral intake therefore will administer IV fluids at 50 mL per hour for 24 hours -Preadmission Lasix and potassium on hold    Hypertension -Blood  pressure well controlled so continue preadmission medications except for diuretics as noted above    COPD with Chronic respiratory failure with hypoxia  -Currently compensated -Continue home dose oxygen    Paroxysmal atrial fibrillation  -Currently rate controlled with underlying bradycardia -Pharmacy to manage warfarin -CHADVASc= 6     Normocytic anemia -Hemoglobin stable and at baseline    Hyperthyroidism -Continue Tapazole -TSH and T4 normal    Closed displaced fracture of left great toe -Continue boot -PT evaluation -After discharge follow-up with orthopedic physician as instructed with current appointment in place    DVT Prophylaxis: Warfarin  Family Communication:  Multiple family members at bedside   Code Status:  DO NOT RESUSCITATE  Condition:  Stable  Discharge disposition: Discharge back to home environment once proves no longer having episodes of significant hypoglycemia off medications  Time spent in minutes : 60      Jaala Bohle L. ANP on 06/30/2015 at 12:25 PM  You may contact me by going to [email or web address] - password [email or web address]  I am available from 7a-7p but please confirm I am on the schedule by going to Amion as above.   After 7p please contact night coverage person covering me after hours  [facility]

## 2015-06-30 NOTE — ED Notes (Signed)
Pt eating peanut butter crackers and drinking juice.

## 2015-06-30 NOTE — ED Notes (Signed)
Attempted to call report

## 2015-06-30 NOTE — ED Notes (Signed)
Pt arrives EMS with c/o unable to wake from sleep at home with CBG 35. Pt awakes with 1 amp dextrose 50 given by EMS. Pt denies pain on arrival. Pt seen here for same yesterday.

## 2015-06-30 NOTE — Progress Notes (Signed)
Pt is a non-tele per MD.

## 2015-07-01 ENCOUNTER — Observation Stay (HOSPITAL_COMMUNITY): Payer: Medicare Other

## 2015-07-01 DIAGNOSIS — Z880 Allergy status to penicillin: Secondary | ICD-10-CM | POA: Diagnosis not present

## 2015-07-01 DIAGNOSIS — I480 Paroxysmal atrial fibrillation: Secondary | ICD-10-CM | POA: Diagnosis present

## 2015-07-01 DIAGNOSIS — S92402D Displaced unspecified fracture of left great toe, subsequent encounter for fracture with routine healing: Secondary | ICD-10-CM | POA: Diagnosis not present

## 2015-07-01 DIAGNOSIS — E11649 Type 2 diabetes mellitus with hypoglycemia without coma: Secondary | ICD-10-CM | POA: Diagnosis present

## 2015-07-01 DIAGNOSIS — I429 Cardiomyopathy, unspecified: Secondary | ICD-10-CM | POA: Diagnosis present

## 2015-07-01 DIAGNOSIS — H3530 Unspecified macular degeneration: Secondary | ICD-10-CM | POA: Diagnosis present

## 2015-07-01 DIAGNOSIS — I5033 Acute on chronic diastolic (congestive) heart failure: Secondary | ICD-10-CM | POA: Diagnosis present

## 2015-07-01 DIAGNOSIS — J449 Chronic obstructive pulmonary disease, unspecified: Secondary | ICD-10-CM | POA: Diagnosis present

## 2015-07-01 DIAGNOSIS — Z7984 Long term (current) use of oral hypoglycemic drugs: Secondary | ICD-10-CM | POA: Diagnosis not present

## 2015-07-01 DIAGNOSIS — N184 Chronic kidney disease, stage 4 (severe): Secondary | ICD-10-CM | POA: Diagnosis present

## 2015-07-01 DIAGNOSIS — E162 Hypoglycemia, unspecified: Secondary | ICD-10-CM | POA: Diagnosis present

## 2015-07-01 DIAGNOSIS — B3749 Other urogenital candidiasis: Secondary | ICD-10-CM | POA: Diagnosis present

## 2015-07-01 DIAGNOSIS — J9621 Acute and chronic respiratory failure with hypoxia: Secondary | ICD-10-CM | POA: Diagnosis not present

## 2015-07-01 DIAGNOSIS — I130 Hypertensive heart and chronic kidney disease with heart failure and stage 1 through stage 4 chronic kidney disease, or unspecified chronic kidney disease: Secondary | ICD-10-CM | POA: Diagnosis present

## 2015-07-01 DIAGNOSIS — E0590 Thyrotoxicosis, unspecified without thyrotoxic crisis or storm: Secondary | ICD-10-CM | POA: Diagnosis present

## 2015-07-01 DIAGNOSIS — N390 Urinary tract infection, site not specified: Secondary | ICD-10-CM | POA: Diagnosis present

## 2015-07-01 DIAGNOSIS — Z7901 Long term (current) use of anticoagulants: Secondary | ICD-10-CM | POA: Diagnosis not present

## 2015-07-01 DIAGNOSIS — E1142 Type 2 diabetes mellitus with diabetic polyneuropathy: Secondary | ICD-10-CM | POA: Diagnosis present

## 2015-07-01 DIAGNOSIS — Z9981 Dependence on supplemental oxygen: Secondary | ICD-10-CM | POA: Diagnosis not present

## 2015-07-01 DIAGNOSIS — Z66 Do not resuscitate: Secondary | ICD-10-CM | POA: Diagnosis present

## 2015-07-01 DIAGNOSIS — E1122 Type 2 diabetes mellitus with diabetic chronic kidney disease: Secondary | ICD-10-CM | POA: Diagnosis present

## 2015-07-01 DIAGNOSIS — D649 Anemia, unspecified: Secondary | ICD-10-CM | POA: Diagnosis present

## 2015-07-01 DIAGNOSIS — Z885 Allergy status to narcotic agent status: Secondary | ICD-10-CM | POA: Diagnosis not present

## 2015-07-01 DIAGNOSIS — Z884 Allergy status to anesthetic agent status: Secondary | ICD-10-CM | POA: Diagnosis not present

## 2015-07-01 DIAGNOSIS — I5032 Chronic diastolic (congestive) heart failure: Secondary | ICD-10-CM | POA: Diagnosis not present

## 2015-07-01 DIAGNOSIS — Z7982 Long term (current) use of aspirin: Secondary | ICD-10-CM | POA: Diagnosis not present

## 2015-07-01 DIAGNOSIS — Z7722 Contact with and (suspected) exposure to environmental tobacco smoke (acute) (chronic): Secondary | ICD-10-CM | POA: Diagnosis present

## 2015-07-01 DIAGNOSIS — R001 Bradycardia, unspecified: Secondary | ICD-10-CM | POA: Diagnosis present

## 2015-07-01 DIAGNOSIS — Z91048 Other nonmedicinal substance allergy status: Secondary | ICD-10-CM | POA: Diagnosis not present

## 2015-07-01 DIAGNOSIS — J9611 Chronic respiratory failure with hypoxia: Secondary | ICD-10-CM | POA: Diagnosis not present

## 2015-07-01 LAB — T4: T4 TOTAL: 7.2 ug/dL (ref 4.5–12.0)

## 2015-07-01 LAB — PROTIME-INR
INR: 1.53 — ABNORMAL HIGH (ref 0.00–1.49)
PROTHROMBIN TIME: 18.4 s — AB (ref 11.6–15.2)

## 2015-07-01 LAB — GLUCOSE, CAPILLARY
GLUCOSE-CAPILLARY: 94 mg/dL (ref 65–99)
GLUCOSE-CAPILLARY: 103 mg/dL — AB (ref 65–99)
GLUCOSE-CAPILLARY: 194 mg/dL — AB (ref 65–99)
GLUCOSE-CAPILLARY: 157 mg/dL — AB (ref 65–99)
Glucose-Capillary: 176 mg/dL — ABNORMAL HIGH (ref 65–99)

## 2015-07-01 LAB — MRSA PCR SCREENING: MRSA by PCR: NEGATIVE

## 2015-07-01 MED ORDER — FUROSEMIDE 10 MG/ML IJ SOLN
60.0000 mg | Freq: Two times a day (BID) | INTRAMUSCULAR | Status: DC
  Administered 2015-07-01 – 2015-07-03 (×4): 60 mg via INTRAVENOUS
  Filled 2015-07-01 (×4): qty 6

## 2015-07-01 MED ORDER — WARFARIN SODIUM 4 MG PO TABS
4.0000 mg | ORAL_TABLET | Freq: Once | ORAL | Status: AC
  Administered 2015-07-01: 4 mg via ORAL
  Filled 2015-07-01: qty 1

## 2015-07-01 MED ORDER — FLUCONAZOLE 100 MG PO TABS
100.0000 mg | ORAL_TABLET | Freq: Every day | ORAL | Status: DC
  Administered 2015-07-01 – 2015-07-05 (×5): 100 mg via ORAL
  Filled 2015-07-01 (×6): qty 1

## 2015-07-01 MED ORDER — FUROSEMIDE 10 MG/ML IJ SOLN
40.0000 mg | Freq: Once | INTRAMUSCULAR | Status: AC
  Administered 2015-07-01: 40 mg via INTRAVENOUS

## 2015-07-01 MED ORDER — FUROSEMIDE 10 MG/ML IJ SOLN
40.0000 mg | Freq: Once | INTRAMUSCULAR | Status: AC
  Administered 2015-07-01: 40 mg via INTRAVENOUS
  Filled 2015-07-01: qty 4

## 2015-07-01 MED ORDER — FUROSEMIDE 10 MG/ML IJ SOLN
INTRAMUSCULAR | Status: AC
  Filled 2015-07-01: qty 4

## 2015-07-01 MED ORDER — LEVOFLOXACIN IN D5W 500 MG/100ML IV SOLN
500.0000 mg | INTRAVENOUS | Status: DC
  Administered 2015-07-01 – 2015-07-02 (×2): 500 mg via INTRAVENOUS
  Filled 2015-07-01 (×2): qty 100

## 2015-07-01 NOTE — Progress Notes (Signed)
Dr. Arthor Captain notified by text that if he wants patient cardiac monitored, he will need to enter the order.

## 2015-07-01 NOTE — Progress Notes (Signed)
Patient to be transferred to 3S02 stepdown report given.

## 2015-07-01 NOTE — Progress Notes (Signed)
PT Cancellation Note  Patient Details Name: Alexis [name] MRN: [id number] DOB: 03-May-1932   Cancelled Treatment:    Reason Eval/Treat Not Completed: Medical issues which prohibited therapy, per Nsg Randa Lynn) hold therapies today due to resp. Issues.    Fabio Asa 07/01/2015, 10:05 AM Charlotte Crumb, PT DPT  [phone]

## 2015-07-01 NOTE — Progress Notes (Signed)
[facility] PROGRESS NOTE   Alexis [name] ZOX:[id number] DOB: 1931-07-21 DOA: 06/30/2015 PCP: Elizabeth Palau, FNP  HPI/Subjective: The overall labored breathing and SOB this morning. CXR showed pulmonary edema.  Assessment/Plan: Principal Problem:   Hypoglycemia associated with type 2 diabetes mellitus (HCC) Active Problems:   Chronic diastolic heart failure, NYHA class 1 (HCC)   Hypertension   COPD (chronic obstructive pulmonary disease) (HCC)   Normocytic anemia   Paroxysmal atrial fibrillation (HCC)   Hyperthyroidism   Chronic respiratory failure with hypoxia (HCC)   Closed displaced fracture of left great toe   CKD (chronic kidney disease), stage IV (HCC)   UTI (lower urinary tract infection)   Hypoglycemia associated with diabetes (HCC)   Acute on chronic diastolic heart failure, NYHA class 1  -Patient developed shortness of breath, labored breathing overnight. -Being on normal saline at 50 mL per hour, also her preadmission Demadex held. -Chest x-ray showed pulmonary edema. -Patient transferred to stepdown, started on IV Lasix. BiPAP as needed.  Acute on chronic respiratory failure -Patient is on 2 L of oxygen at home, required more than 6 L of oxygen, currently on BiPAP. -This is secondary to pulmonary edema from acute on chronic diastolic CHF. -Started on diuresis, follow closely.  Hypoglycemia associated with type 2 diabetes mellitus  -Etiology appears to be related to decreased clearance of oral hypoglycemic agent in patient with GFR less than 20 -Admit to floor/Obs -Permanently discontinue glyburide, patient might do okay with only diet control.  CKD, stage IV  -Renal function stable and at baseline, on Demadex at home held on admission  UTI  -Started on Keflex in the ED, will switched to levofloxacin.  Hypertension -Blood pressure well controlled so continue preadmission medications except for diuretics as noted above  COPD with Chronic  respiratory failure with hypoxia  -Currently compensated -Continue home dose oxygen  Paroxysmal atrial fibrillation  -Currently rate controlled with underlying bradycardia -Pharmacy to manage warfarin -CHADVASc= 6  Normocytic anemia -Hemoglobin stable and at baseline  Hyperthyroidism -Continue Tapazole -TSH and T4 normal  Closed displaced fracture of left great toe -Continue boot -PT evaluation -After discharge follow-up with orthopedic physician as instructed with current appointment in place  Code Status: DNR Family Communication: Plan discussed with the patient. Disposition Plan: Remains inpatient Diet: Diet Heart Room service appropriate?: Yes; Fluid consistency:: Thin  Consultants:  None  Procedures:  None  Antibiotics:  None   Objective: Filed Vitals:   07/01/15 1229 07/01/15 1240  BP: 189/81 189/81  Pulse: 70 88  Temp: 98.1 F (36.7 C)   Resp: 20 30    Intake/Output Summary (Last 24 hours) at 07/01/15 1252 Last data filed at 07/01/15 1138  Gross per 24 hour  Intake 1182.5 ml  Output   1400 ml  Net -217.5 ml   Filed Weights   06/30/15 0827 06/30/15 1338 07/01/15 0401  Weight: 63.504 kg (140 lb) 67.631 kg (149 lb 1.6 oz) 68.04 kg (150 lb)    Exam: General: Alert and awake, oriented x3, not in any acute distress. HEENT: anicteric sclera, pupils reactive to light and accommodation, EOMI CVS: S1-S2 clear, no murmur rubs or gallops Chest: clear to auscultation bilaterally, no wheezing, rales or rhonchi Abdomen: soft nontender, nondistended, normal bowel sounds, no organomegaly Extremities: no cyanosis, clubbing or edema noted bilaterally Neuro: Cranial nerves II-XII intact, no focal neurological deficits  Data Reviewed: Basic Metabolic Panel:  Recent Labs Lab 06/29/15 0610 06/30/15 0847  NA 141 141  K 4.7 4.8  CL  104 105  CO2 27 27  GLUCOSE 90 78  BUN 45* 46*  CREATININE 2.60* 2.68*  CALCIUM 8.6* 8.8*   Liver Function  Tests:  Recent Labs Lab 06/30/15 0847  AST 54*  ALT 30  ALKPHOS 49  BILITOT 0.3  PROT 6.2*  ALBUMIN 3.3*   No results for input(s): LIPASE, AMYLASE in the last 168 hours. No results for input(s): AMMONIA in the last 168 hours. CBC:  Recent Labs Lab 06/29/15 0610 06/30/15 0847  WBC 4.3 5.4  NEUTROABS 3.4 4.5  HGB 8.5* 9.0*  HCT 27.4* 30.1*  MCV 87.8 88.8  PLT 236 252   Cardiac Enzymes: No results for input(s): CKTOTAL, CKMB, CKMBINDEX, TROPONINI in the last 168 hours. BNP (last 3 results)  Recent Labs  12/21/14 0745 06/11/15 1755 06/20/15 1630  BNP 557.5* 914.1* 1716.4*    ProBNP (last 3 results) No results for input(s): PROBNP in the last 8760 hours.  CBG:  Recent Labs Lab 06/30/15 2010 06/30/15 2338 07/01/15 0358 07/01/15 0756 07/01/15 1153  GLUCAP 154* 101* 94 103* 176*    Micro Recent Results (from the past 240 hour(s))  Urine culture     Status: None   Collection Time: 06/29/15  6:10 AM  Result Value Ref Range Status   Specimen Description URINE, CLEAN CATCH  Final   Special Requests NONE  Final   Culture MULTIPLE SPECIES PRESENT, SUGGEST RECOLLECTION  Final   Report Status 06/30/2015 FINAL  Final     Studies: Dg Chest Port 1 View  07/01/2015  CLINICAL DATA:  Shortness of breath. EXAM: PORTABLE CHEST 1 VIEW COMPARISON:  June 21, 2015. FINDINGS: Stable cardiomegaly. No pneumothorax or significant pleural effusion is noted. Mildly increased interstitial densities are noted throughout the lungs concerning for pulmonary edema. Bony thorax is unremarkable. IMPRESSION: Cardiomegaly and increased interstitial densities are noted bilaterally concerning for pulmonary edema. Electronically Signed   By: Lupita Raider, M.D.   On: 07/01/2015 10:44    Scheduled Meds: . atorvastatin  20 mg Oral QHS  . cholecalciferol  1,000 Units Oral q morning - 10a  . escitalopram  10 mg Oral Daily  . fenofibrate  160 mg Oral Daily  . fluconazole  100 mg Oral  Daily  . gabapentin  100 mg Oral Daily  . hydrALAZINE  50 mg Oral Q12H  . insulin aspart  0-9 Units Subcutaneous 6 times per day  . isosorbide mononitrate  30 mg Oral Daily  . levofloxacin (LEVAQUIN) IV  500 mg Intravenous Q24H  . methimazole  5 mg Oral Daily  . metoprolol tartrate  25 mg Oral BID  . multivitamin with minerals  1 tablet Oral Daily  . warfarin  4 mg Oral ONCE-1800  . Warfarin - Pharmacist Dosing Inpatient   Does not apply q1800   Continuous Infusions:      Time spent: 35 minutes    [employer] [facility]  [facility] Pager [phone] If 7PM-7AM, please contact night-coverage at [email or web address], password [facility] 07/01/2015, 12:52 PM  LOS: 1 day

## 2015-07-01 NOTE — Progress Notes (Addendum)
Patient very labored resp at 26 lung sounds very tight and diminished with wheezing bilateral. Neb TX given Dr Arthor Captain called 40 mg lasix iv ordered and given. No improvement at this time. Rapid response nurse called .

## 2015-07-01 NOTE — Significant Event (Signed)
Rapid Response Event Note  Overview: Time Called: 1000 Arrival Time: 1003 Event Type: Respiratory  Initial Focused Assessment:  Called by primary RN for patient with respiratory distress.  Upon my arrival to patients, patient sitting in chair on nasal cannula 3.5 LPM.    VS 176/54, HR 86, RR 36, Spo2 95%.  Skin is warm and dry.  Patient alert and oriented, SOB and increased WOB, pursed lip breathing noted.  Breath Sounds diminished at bases with crackles and faint expiratory wheezes.  As per RN, patient has received breathing treatment and 40 mg IV lasix.  Patient states "she is getting tired"   Interventions:  Dr. Arthor Captain paged and at bedside.  Called Xray for PCXR.  Patient voided 200cc of yellow urine and has had a solid medium formed BM.  PCXR obtained   Event Summary:  Patient to transfer to SDU   at      at          [facility], [place]

## 2015-07-01 NOTE — Progress Notes (Addendum)
ANTICOAGULATION CONSULT NOTE - Initial Consult  Pharmacy Consult for Coumadin  Indication: atrial fibrillation  Allergies  Allergen Reactions  . Yellow Dyes (Non-Tartrazine) Nausea And Vomiting    "deathly allergic" No other information given to explain reaction.  . Codeine Hives  . Other Other (See Comments)    novocaine broke mouth out  . Penicillins Hives  . Procaine Hives    Patient Measurements: Height: 5\' 2"  (157.5 cm) Weight: 150 lb (68.04 kg) IBW/kg (Calculated) : 50.1  Vital Signs: Temp: 98 F (36.7 C) (02/04 0401) Temp Source: Oral (02/04 0401) BP: 186/67 mmHg (02/04 1000) Pulse Rate: 86 (02/04 1000)  Labs:  Recent Labs  06/29/15 0610 06/30/15 0847 06/30/15 1410 07/01/15 0343  HGB 8.5* 9.0*  --   --   HCT 27.4* 30.1*  --   --   PLT 236 252  --   --   LABPROT  --   --  17.4* 18.4*  INR  --   --  1.42 1.53*  CREATININE 2.60* 2.68*  --   --     Estimated Creatinine Clearance: 14.9 mL/min (by C-G formula based on Cr of 2.68).   Medical History: Past Medical History  Diagnosis Date  . Hypertension   . Neuropathy (HCC)   . Anxiety   . PONV (postoperative nausea and vomiting)   . CHF (congestive heart failure) (HCC)   . COPD (chronic obstructive pulmonary disease) (HCC)     on home, nocturnal oxygen.   . Family history of adverse reaction to anesthesia   . Cardiomyopathy (HCC)   . Diabetes mellitus without complication (HCC)     TYPE 2  . Macular degeneration, bilateral   . Varicose veins   . GIB (gastrointestinal bleeding)   . Paroxysmal atrial fibrillation (HCC)   . Shortness of breath dyspnea   . Acute on chronic diastolic heart failure (HCC) 12/22/2014   Assessment:  82 yr old female admitted with hypoglycemia. To continue Coumadin for atrial fibrillation .Recent inpatient stay 1/24-1/26/17.   INRs were 1.32-1.44 during that admission.  Previously on Coumadin 1 mg daily, but increased to 2.5 mg daily at discharge 1/27.  Family reports that  she's been taking Coumadin 2.5 mg daily since that time. Last dose 2/2 pm.   Had heparin bridge 1/24-1/25 last admission; stopped due to Hgb 10>7.9.  INR remains subtherapuetic at 1.53.  No s/sx bleeding noted. Hgb 9, Plts 252  DDI: Levaquin and Fluconazole may increase INR.   Goal of Therapy:  INR 2-3 Monitor platelets by anticoagulation protocol: Yes   Plan:   Coumadin 4 mg x 1 today.  Daily PT/INR. CBC Q3days Monitor for s/sx of bleeding  Hazle Nordmann, PharmD Pharmacy Resident [phone] 07/01/2015,12:00 PM

## 2015-07-02 ENCOUNTER — Inpatient Hospital Stay (HOSPITAL_COMMUNITY): Payer: Medicare Other

## 2015-07-02 DIAGNOSIS — I5033 Acute on chronic diastolic (congestive) heart failure: Secondary | ICD-10-CM

## 2015-07-02 LAB — GLUCOSE, CAPILLARY
GLUCOSE-CAPILLARY: 149 mg/dL — AB (ref 65–99)
GLUCOSE-CAPILLARY: 156 mg/dL — AB (ref 65–99)
GLUCOSE-CAPILLARY: 212 mg/dL — AB (ref 65–99)
GLUCOSE-CAPILLARY: 230 mg/dL — AB (ref 65–99)
Glucose-Capillary: 182 mg/dL — ABNORMAL HIGH (ref 65–99)
Glucose-Capillary: 329 mg/dL — ABNORMAL HIGH (ref 65–99)
Glucose-Capillary: 209 mg/dL — ABNORMAL HIGH (ref 65–99)

## 2015-07-02 LAB — CBC
HCT: 26.7 % — ABNORMAL LOW (ref 36.0–46.0)
Hemoglobin: 8.1 g/dL — ABNORMAL LOW (ref 12.0–15.0)
MCH: 26.4 pg (ref 26.0–34.0)
MCHC: 30.3 g/dL (ref 30.0–36.0)
MCV: 87 fL (ref 78.0–100.0)
PLATELETS: 213 10*3/uL (ref 150–400)
RBC: 3.07 MIL/uL — AB (ref 3.87–5.11)
RDW: 16.8 % — ABNORMAL HIGH (ref 11.5–15.5)
WBC: 6.1 10*3/uL (ref 4.0–10.5)

## 2015-07-02 LAB — BASIC METABOLIC PANEL
Anion gap: 10 (ref 5–15)
BUN: 46 mg/dL — ABNORMAL HIGH (ref 6–20)
CHLORIDE: 100 mmol/L — AB (ref 101–111)
CO2: 29 mmol/L (ref 22–32)
CREATININE: 2.38 mg/dL — AB (ref 0.44–1.00)
Calcium: 8.6 mg/dL — ABNORMAL LOW (ref 8.9–10.3)
GFR, EST AFRICAN AMERICAN: 21 mL/min — AB (ref >60)
GFR, EST NON AFRICAN AMERICAN: 18 mL/min — AB (ref >60)
Glucose, Bld: 203 mg/dL — ABNORMAL HIGH (ref 65–99)
Potassium: 4.3 mmol/L (ref 3.5–5.1)
SODIUM: 139 mmol/L (ref 135–145)

## 2015-07-02 LAB — PROTIME-INR
INR: 2.73 — AB (ref 0.00–1.49)
Prothrombin Time: 28.5 seconds — ABNORMAL HIGH (ref 11.6–15.2)

## 2015-07-02 MED ORDER — WHITE PETROLATUM GEL
Status: AC
Start: 2015-07-02 — End: 2015-07-02
  Administered 2015-07-02: 08:00:00 via DENTAL
  Filled 2015-07-02: qty 1

## 2015-07-02 MED ORDER — LEVOFLOXACIN IN D5W 500 MG/100ML IV SOLN: 500.0000 mg | INTRAVENOUS | Status: DC

## 2015-07-02 MED ORDER — WARFARIN SODIUM 1 MG PO TABS
1.0000 mg | ORAL_TABLET | Freq: Once | ORAL | Status: DC
Start: 2015-07-02 — End: 2015-07-02
  Filled 2015-07-02: qty 1

## 2015-07-02 NOTE — Progress Notes (Signed)
Patient off BIPAP at this time. Resting. No distress noted. Vital signs WNL.

## 2015-07-02 NOTE — Evaluation (Signed)
Physical Therapy Evaluation Patient Details Name: Alexis [name] MRN: [id number] DOB: 1931/07/21 Today's Date: 07/02/2015   History of Present Illness  82 year old female with history of diastolic heart failure, atrial fibrillation, COPD, diabetes mellitus, that presented to the emergency department with complaints of hypoglycemia. Respiratory distress on 2/4 with transfer to [facility] unit  Clinical Impression  Pt admitted with above diagnosis. Pt currently with functional limitations due to the deficits listed below (see PT Problem List). Worth considering a post-acute rehab stay to maximize independence and safety with mobility prior to dc home; It also may help with decreasing readmissions;  Pt will benefit from skilled PT to increase their independence and safety with mobility to allow discharge to the venue listed below.       Follow Up Recommendations SNF    Equipment Recommendations  None recommended by PT    Recommendations for Other Services OT consult     Precautions / Restrictions Precautions Precautions: Fall Restrictions Weight Bearing Restrictions: No      Mobility  Bed Mobility Overal bed mobility: Needs Assistance Bed Mobility: Supine to Sit     Supine to sit: Min guard     General bed mobility comments: Cues for technique, and to scoot to EOB; typically sleeps in recliner  Transfers Overall transfer level: Needs assistance Equipment used: Rolling walker (2 wheeled) Transfers: Sit to/from Stand Sit to Stand: Min guard         General transfer comment: Cues for safety and hand placement  Ambulation/Gait Ambulation/Gait assistance: Min guard Ambulation Distance (Feet): 75 Feet Assistive device: Rolling walker (2 wheeled) Gait Pattern/deviations: Step-through pattern     General Gait Details: Cues to self-monitor for activity tolerance  Stairs            Wheelchair Mobility    Modified Rankin (Stroke Patients Only)       Balance  Overall balance assessment: Needs assistance   Sitting balance-Leahy Scale: Good       Standing balance-Leahy Scale: Fair                               Pertinent Vitals/Pain Pain Assessment: No/denies pain    Home Living Family/patient expects to be discharged to:: Private residence Living Arrangements: Children Available Help at Discharge: Family;Available 24 hours/day Type of Home: House Home Access: Stairs to enter Entrance Stairs-Rails: None Entrance Stairs-Number of Steps: 1 Home Layout: One level Home Equipment: Shower seat;Bedside commode;Walker - 4 wheels;Other (comment) Additional Comments: Pt sleeps in recliner    Prior Function Level of Independence: Needs assistance   Gait / Transfers Assistance Needed: ambulates with rollator  ADL's / Homemaking Assistance Needed: son helps with LB dressing and does housekeeping and meal prep  Comments: hx of falls     Hand Dominance   Dominant Hand: Right    Extremity/Trunk Assessment   Upper Extremity Assessment: Generalized weakness (arthritic changes in hands)           Lower Extremity Assessment: Generalized weakness         Communication   Communication: HOH  Cognition Arousal/Alertness: Awake/alert Behavior During Therapy: WFL for tasks assessed/performed Overall Cognitive Status: Within Functional Limits for tasks assessed                      General Comments General comments (skin integrity, edema, etc.): walked on supplemebtal O2    Exercises        Assessment/Plan  PT Assessment Patient needs continued PT services  PT Diagnosis Generalized weakness;Other (comment) (decr activity tolerance)   PT Problem List Decreased strength;Decreased activity tolerance;Decreased balance;Decreased mobility;Decreased knowledge of use of DME;Decreased knowledge of precautions;Cardiopulmonary status limiting activity  PT Treatment Interventions DME instruction;Gait training;Stair  training;Functional mobility training;Therapeutic activities;Therapeutic exercise;Patient/family education   PT Goals (Current goals can be found in the Care Plan section) Acute Rehab PT Goals Patient Stated Goal: return home PT Goal Formulation: With patient Time For Goal Achievement: 07/16/15 Potential to Achieve Goals: Good    Frequency Min 3X/week   Barriers to discharge        Co-evaluation               End of Session Equipment Utilized During Treatment: Gait belt;Oxygen Activity Tolerance: Patient tolerated treatment well Patient left: in chair;with call bell/phone within reach Nurse Communication: Mobility status         Time: 8119-1478 PT Time Calculation (min) (ACUTE ONLY): 30 min   Charges:   PT Evaluation $PT Eval Moderate Complexity: 1 Procedure PT Treatments $Gait Training: 8-22 mins   PT G Codes:        Van Clines Hamff 07/02/2015, 1:20 PM  Van Clines, [place]  Acute Rehabilitation Services Pager [phone] Office [phone]

## 2015-07-02 NOTE — Discharge Instructions (Signed)
Please read and follow all provided instructions.  Your diagnoses today include:  1. Hypoglycemia     Tests performed today include:  Vital signs. See below for your results today.   Medications prescribed:   None   Home care instructions:  Follow any educational materials contained in this packet. Please STOP taking the Glipizide. You will follow up with your PCP next week for further recommendations. Please record your blood sugars multiple times throughout the day (Before meals) to get a better idea of what medication to take.   Follow-up instructions: Please follow-up with your primary care provider in the next 48-72 hours for further evaluation of symptoms and treatment   Return instructions:   Please return to the Emergency Department if you do not get better, if you get worse, or new symptoms OR  - Fever (temperature greater than 101.65F)  - Bleeding that does not stop with holding pressure to the area    -Severe pain (please note that you may be more sore the day after your accident)  - Chest Pain  - Difficulty breathing  - Severe nausea or vomiting  - Inability to tolerate food and liquids  - Passing out  - Skin becoming red around your wounds  - Change in mental status (confusion or lethargy)  - New numbness or weakness     Please return if you have any other emergent concerns.  Additional Information:  Your vital signs today were: BP 131/39 mmHg   Pulse 51   Temp(Src) 96.5 F (35.8 C) (Oral)   Resp 25   Ht  (1.575 m)   Wt 63.504 kg   BMI 25.60 kg/m2   SpO2 100% If your blood pressure (BP) was elevated above 135/85 this visit, please have this repeated by your doctor within one month. ---------------  Information on my medicine - Coumadin   (Warfarin)  This medication education was reviewed with me or my healthcare representative as part of my discharge preparation.  The pharmacist that spoke with me during my hospital stay was:  Synetta Fail, RPH  Why  was Coumadin prescribed for you? Coumadin was prescribed for you because you have a blood clot or a medical condition that can cause an increased risk of forming blood clots. Blood clots can cause serious health problems by blocking the flow of blood to the heart, lung, or brain. Coumadin can prevent harmful blood clots from forming. As a reminder your indication for Coumadin is:   Stroke Prevention Because Of Atrial Fibrillation  What test will check on my response to Coumadin? While on Coumadin (warfarin) you will need to have an INR test regularly to ensure that your dose is keeping you in the desired range. The INR (international normalized ratio) number is calculated from the result of the laboratory test called prothrombin time (PT).  If an INR APPOINTMENT HAS NOT ALREADY BEEN MADE FOR YOU please schedule an appointment to have this lab work done by your health care provider within 7 days. Your INR goal is usually a number between:  2 to 3 or your provider may give you a more narrow range like 2-2.5.  Ask your health care provider during an office visit what your goal INR is.  What  do you need to  know  About  COUMADIN? Take Coumadin (warfarin) exactly as prescribed by your healthcare provider about the same time each day.  DO NOT stop taking without talking to the doctor who prescribed the medication.  Stopping without other blood clot prevention medication to take the place of Coumadin may increase your risk of developing a new clot or stroke.  Get refills before you run out.  What do you do if you miss a dose? If you miss a dose, take it as soon as you remember on the same day then continue your regularly scheduled regimen the next day.  Do not take two doses of Coumadin at the same time.  Important Safety Information A possible side effect of Coumadin (Warfarin) is an increased risk of bleeding. You should call your healthcare provider right away if you experience any of the  following: ? Bleeding from an injury or your nose that does not stop. ? Unusual colored urine (red or dark brown) or unusual colored stools (red or black). ? Unusual bruising for unknown reasons. ? A serious fall or if you hit your head (even if there is no bleeding).  Some foods or medicines interact with Coumadin (warfarin) and might alter your response to warfarin. To help avoid this: ? Eat a balanced diet, maintaining a consistent amount of Vitamin K. ? Notify your provider about major diet changes you plan to make. ? Avoid alcohol or limit your intake to 1 drink for women and 2 drinks for men per day. (1 drink is 5 oz. wine, 12 oz. beer, or 1.5 oz. liquor.)  Make sure that ANY health care provider who prescribes medication for you knows that you are taking Coumadin (warfarin).  Also make sure the healthcare provider who is monitoring your Coumadin knows when you have started a new medication including herbals and non-prescription products.  Coumadin (Warfarin)  Major Drug Interactions  Increased Warfarin Effect Decreased Warfarin Effect  Alcohol (large quantities) Antibiotics (esp. Septra/Bactrim, Flagyl, Cipro) Amiodarone (Cordarone) Aspirin (ASA) Cimetidine (Tagamet) Megestrol (Megace) NSAIDs (ibuprofen, naproxen, etc.) Piroxicam (Feldene) Propafenone (Rythmol SR) Propranolol (Inderal) Isoniazid (INH) Posaconazole (Noxafil) Barbiturates (Phenobarbital) Carbamazepine (Tegretol) Chlordiazepoxide (Librium) Cholestyramine (Questran) Griseofulvin Oral Contraceptives Rifampin Sucralfate (Carafate) Vitamin K   Coumadin (Warfarin) Major Herbal Interactions  Increased Warfarin Effect Decreased Warfarin Effect  Garlic Ginseng Ginkgo biloba Coenzyme Q10 Green tea St. Johns wort    Coumadin (Warfarin) FOOD Interactions  Eat a consistent number of servings per week of foods HIGH in Vitamin K (1 serving =  cup)  Collards (cooked, or boiled & drained) Kale (cooked, or  boiled & drained) Mustard greens (cooked, or boiled & drained) Parsley *serving size only =  cup Spinach (cooked, or boiled & drained) Swiss chard (cooked, or boiled & drained) Turnip greens (cooked, or boiled & drained)  Eat a consistent number of servings per week of foods MEDIUM-HIGH in Vitamin K (1 serving = 1 cup)  Asparagus (cooked, or boiled & drained) Broccoli (cooked, boiled & drained, or raw & chopped) Brussel sprouts (cooked, or boiled & drained) *serving size only =  cup Lettuce, raw (green leaf, endive, romaine) Spinach, raw Turnip greens, raw & chopped   These websites have more information on Coumadin (warfarin):  http://www.king-russell.com/; https://www.hines.net/;

## 2015-07-02 NOTE — Progress Notes (Signed)
ANTICOAGULATION CONSULT NOTE - Initial Consult  Pharmacy Consult for Coumadin  Indication: atrial fibrillation  Allergies  Allergen Reactions  . Yellow Dyes (Non-Tartrazine) Nausea And Vomiting    "deathly allergic" No other information given to explain reaction.  . Codeine Hives  . Other Other (See Comments)    novocaine broke mouth out  . Penicillins Hives  . Procaine Hives    Patient Measurements: Height: 5\' 2"  (157.5 cm) Weight: 150 lb (68.04 kg) IBW/kg (Calculated) : 50.1  Vital Signs: Temp: 98.7 F (37.1 C) (02/05 0722) Temp Source: Oral (02/05 0722) BP: 162/48 mmHg (02/05 0722) Pulse Rate: 61 (02/04 2308)  Labs:  Recent Labs  06/30/15 0847 06/30/15 1410 07/01/15 0343 07/02/15 0430  HGB 9.0*  --   --  8.1*  HCT 30.1*  --   --  26.7*  PLT 252  --   --  213  LABPROT  --  17.4* 18.4* 28.5*  INR  --  1.42 1.53* 2.73*  CREATININE 2.68*  --   --  2.38*    Estimated Creatinine Clearance: 16.8 mL/min (by C-G formula based on Cr of 2.38).   Medical History: Past Medical History  Diagnosis Date  . Hypertension   . Neuropathy (HCC)   . Anxiety   . PONV (postoperative nausea and vomiting)   . CHF (congestive heart failure) (HCC)   . COPD (chronic obstructive pulmonary disease) (HCC)     on home, nocturnal oxygen.   . Family history of adverse reaction to anesthesia   . Cardiomyopathy (HCC)   . Diabetes mellitus without complication (HCC)     TYPE 2  . Macular degeneration, bilateral   . Varicose veins   . GIB (gastrointestinal bleeding)   . Paroxysmal atrial fibrillation (HCC)   . Shortness of breath dyspnea   . Acute on chronic diastolic heart failure (HCC) 12/22/2014   Assessment:  82 yr old female admitted with hypoglycemia. To continue Coumadin for atrial fibrillation .DDI: Levaquin and Fluconazole may increase INR.  INR therapuetic at 2.73 (>1 point INR jump, likely due to DDI)  No s/sx bleeding noted. Hgb 9>8.1, Plts 252>213. Will hold dose  warfarin due to large jump in INR  Goal of Therapy:  INR 2-3 Monitor platelets by anticoagulation protocol: Yes   Plan:  Hold Coumadin today due to large jump in INR.   Daily PT/INR. CBC Q3days Monitor for s/sx of bleeding  Hazle Nordmann, PharmD Pharmacy Resident [phone] 07/02/2015,11:03 AM

## 2015-07-02 NOTE — Progress Notes (Signed)
[facility] PROGRESS NOTE   Alexis [name]:[id number] DOB: [birth year] DOA: 06/30/2015 PCP: Elizabeth Palau, FNP  HPI/Subjective: Seen with daughter at bedside, denies any shortness of breath. Was on BiPAP briefly yesterday, breathing improved after Lasix restarted.  Assessment/Plan: Principal Problem:   Hypoglycemia associated with type 2 diabetes mellitus (HCC) Active Problems:   Chronic diastolic heart failure, NYHA class 1 (HCC)   Hypertension   COPD (chronic obstructive pulmonary disease) (HCC)   Normocytic anemia   Paroxysmal atrial fibrillation (HCC)   Hyperthyroidism   Chronic respiratory failure with hypoxia (HCC)   Closed displaced fracture of left great toe   CKD (chronic kidney disease), stage IV (HCC)   UTI (lower urinary tract infection)   Hypoglycemia associated with diabetes (HCC)   Acute on chronic diastolic congestive heart failure, NYHA class 1 (HCC)   Acute on chronic diastolic heart failure, NYHA class 1  -Patient developed shortness of breath, labored breathing overnight. -Being on normal saline at 50 mL per hour, also her preadmission Demadex held. -Chest x-ray showed pulmonary edema. -Continue IV Lasix, BiPAP as needed, recheck chest x-ray in a.m.  Acute on chronic respiratory failure -Patient is on 2 L of oxygen at home, required more than 6 L of oxygen, currently on BiPAP. -This is secondary to pulmonary edema from acute on chronic diastolic CHF. -Started on diuresis, follow closely.  Hypoglycemia associated with type 2 diabetes mellitus  -Etiology appears to be related to decreased clearance of oral hypoglycemic agent in patient with GFR less than 20 -Admit to floor/Obs -Permanently discontinue glyburide, patient might do okay with only diet control.  CKD, stage IV  -Renal function stable and at baseline, on Demadex at home held on admission  UTI  -Started on Keflex in the ED, will switched to levofloxacin, adjusted to renal  function. -Urine grew yeast, Diflucan added.  Hypertension -Blood pressure well controlled so continue preadmission medications except for diuretics as noted above  COPD with Chronic respiratory failure with hypoxia  -Currently compensated -Continue home dose oxygen  Paroxysmal atrial fibrillation  -Currently rate controlled with underlying bradycardia -Pharmacy to manage warfarin -CHADVASc= 6  Normocytic anemia -Hemoglobin stable and at baseline  Hyperthyroidism -Continue Tapazole -TSH and T4 normal  Closed displaced fracture of left great toe -Continue boot -PT evaluation -After discharge follow-up with orthopedic physician as instructed with current appointment in place.   Code Status: DNR Family Communication: Plan discussed with the patient. Disposition Plan: Remains inpatient Diet: Diet Heart Room service appropriate?: Yes; Fluid consistency:: Thin  Consultants:  None  Procedures:  None  Antibiotics:  None   Objective: Filed Vitals:   07/02/15 0722 07/02/15 1100  BP: 162/48 147/52  Pulse:  60  Temp: 98.7 F (37.1 C)   Resp:  22    Intake/Output Summary (Last 24 hours) at 07/02/15 1137 Last data filed at 07/02/15 1000  Gross per 24 hour  Intake    340 ml  Output   2675 ml  Net  -2335 ml   Filed Weights   06/30/15 0827 06/30/15 1338 07/01/15 0401  Weight: 63.504 kg (140 lb) 67.631 kg (149 lb 1.6 oz) 68.04 kg (150 lb)    Exam: General: Alert and awake, oriented x3, not in any acute distress. HEENT: anicteric sclera, pupils reactive to light and accommodation, EOMI CVS: S1-S2 clear, no murmur rubs or gallops Chest: clear to auscultation bilaterally, no wheezing, rales or rhonchi Abdomen: soft nontender, nondistended, normal bowel sounds, no organomegaly Extremities: no cyanosis, clubbing or edema  noted bilaterally Neuro: Cranial nerves II-XII intact, no focal neurological deficits  Data Reviewed: Basic Metabolic Panel:  Recent  Labs Lab 06/29/15 0610 06/30/15 0847 07/02/15 0430  NA 141 141 139  K 4.7 4.8 4.3  CL 104 105 100*  CO2 27 27 29   GLUCOSE 90 78 203*  BUN 45* 46* 46*  CREATININE 2.60* 2.68* 2.38*  CALCIUM 8.6* 8.8* 8.6*   Liver Function Tests:  Recent Labs Lab 06/30/15 0847  AST 54*  ALT 30  ALKPHOS 49  BILITOT 0.3  PROT 6.2*  ALBUMIN 3.3*   No results for input(s): LIPASE, AMYLASE in the last 168 hours. No results for input(s): AMMONIA in the last 168 hours. CBC:  Recent Labs Lab 06/29/15 0610 06/30/15 0847 07/02/15 0430  WBC 4.3 5.4 6.1  NEUTROABS 3.4 4.5  --   HGB 8.5* 9.0* 8.1*  HCT 27.4* 30.1* 26.7*  MCV 87.8 88.8 87.0  PLT 236 252 213   Cardiac Enzymes: No results for input(s): CKTOTAL, CKMB, CKMBINDEX, TROPONINI in the last 168 hours. BNP (last 3 results)  Recent Labs  12/21/14 0745 06/11/15 1755 06/20/15 1630  BNP 557.5* 914.1* 1716.4*    ProBNP (last 3 results) No results for input(s): PROBNP in the last 8760 hours.  CBG:  Recent Labs Lab 07/01/15 1605 07/01/15 2033 07/01/15 2306 07/02/15 0438 07/02/15 0751  GLUCAP 194* 157* 230* 212* 182*    Micro Recent Results (from the past 240 hour(s))  Urine culture     Status: None   Collection Time: 06/29/15  6:10 AM  Result Value Ref Range Status   Specimen Description URINE, CLEAN CATCH  Final   Special Requests NONE  Final   Culture MULTIPLE SPECIES PRESENT, SUGGEST RECOLLECTION  Final   Report Status 06/30/2015 FINAL  Final  MRSA PCR Screening     Status: None   Collection Time: 07/01/15 10:45 AM  Result Value Ref Range Status   MRSA by PCR NEGATIVE NEGATIVE Final    Comment:        The GeneXpert MRSA Assay (FDA approved for NASAL specimens only), is one component of a comprehensive MRSA colonization surveillance program. It is not intended to diagnose MRSA infection nor to guide or monitor treatment for MRSA infections.      Studies: Dg Chest Port 1 View  07/01/2015  CLINICAL DATA:   Shortness of breath. EXAM: PORTABLE CHEST 1 VIEW COMPARISON:  June 21, 2015. FINDINGS: Stable cardiomegaly. No pneumothorax or significant pleural effusion is noted. Mildly increased interstitial densities are noted throughout the lungs concerning for pulmonary edema. Bony thorax is unremarkable. IMPRESSION: Cardiomegaly and increased interstitial densities are noted bilaterally concerning for pulmonary edema. Electronically Signed   By: Lupita Raider, M.D.   On: 07/01/2015 10:44    Scheduled Meds: . atorvastatin  20 mg Oral QHS  . cholecalciferol  1,000 Units Oral q morning - 10a  . escitalopram  10 mg Oral Daily  . fenofibrate  160 mg Oral Daily  . fluconazole  100 mg Oral Daily  . furosemide  60 mg Intravenous BID  . gabapentin  100 mg Oral Daily  . hydrALAZINE  50 mg Oral Q12H  . insulin aspart  0-9 Units Subcutaneous 6 times per day  . isosorbide mononitrate  30 mg Oral Daily  . levofloxacin (LEVAQUIN) IV  500 mg Intravenous Q24H  . methimazole  5 mg Oral Daily  . metoprolol tartrate  25 mg Oral BID  . multivitamin with minerals  1 tablet  Oral Daily  . Warfarin - Pharmacist Dosing Inpatient   Does not apply q1800   Continuous Infusions:      Time spent: 35 minutes    [facility] A  [facility] Pager [phone] If 7PM-7AM, please contact night-coverage at [email or web address], password [facility] 07/02/2015, 11:37 AM  LOS: 2 days

## 2015-07-03 LAB — BASIC METABOLIC PANEL
ANION GAP: 11 (ref 5–15)
BUN: 45 mg/dL — ABNORMAL HIGH (ref 6–20)
CHLORIDE: 99 mmol/L — AB (ref 101–111)
CO2: 31 mmol/L (ref 22–32)
CREATININE: 2.4 mg/dL — AB (ref 0.44–1.00)
Calcium: 8.9 mg/dL (ref 8.9–10.3)
GFR calc non Af Amer: 18 mL/min — ABNORMAL LOW (ref >60)
GFR, EST AFRICAN AMERICAN: 21 mL/min — AB (ref >60)
GLUCOSE: 125 mg/dL — AB (ref 65–99)
Potassium: 3.7 mmol/L (ref 3.5–5.1)
Sodium: 141 mmol/L (ref 135–145)

## 2015-07-03 LAB — CBC
HCT: 25.3 % — ABNORMAL LOW (ref 36.0–46.0)
HEMOGLOBIN: 7.9 g/dL — AB (ref 12.0–15.0)
MCH: 26.6 pg (ref 26.0–34.0)
MCHC: 31.2 g/dL (ref 30.0–36.0)
MCV: 85.2 fL (ref 78.0–100.0)
Platelets: 187 10*3/uL (ref 150–400)
RBC: 2.97 MIL/uL — AB (ref 3.87–5.11)
RDW: 16.5 % — ABNORMAL HIGH (ref 11.5–15.5)
WBC: 6.4 10*3/uL (ref 4.0–10.5)

## 2015-07-03 LAB — GLUCOSE, CAPILLARY
GLUCOSE-CAPILLARY: 161 mg/dL — AB (ref 65–99)
Glucose-Capillary: 126 mg/dL — ABNORMAL HIGH (ref 65–99)
Glucose-Capillary: 118 mg/dL — ABNORMAL HIGH (ref 65–99)
Glucose-Capillary: 172 mg/dL — ABNORMAL HIGH (ref 65–99)
Glucose-Capillary: 147 mg/dL — ABNORMAL HIGH (ref 65–99)
Glucose-Capillary: 135 mg/dL — ABNORMAL HIGH (ref 65–99)
Glucose-Capillary: 141 mg/dL — ABNORMAL HIGH (ref 65–99)
Glucose-Capillary: 128 mg/dL — ABNORMAL HIGH (ref 65–99)

## 2015-07-03 LAB — PROTIME-INR
INR: 3.41 — ABNORMAL HIGH (ref 0.00–1.49)
Prothrombin Time: 33.7 seconds — ABNORMAL HIGH (ref 11.6–15.2)

## 2015-07-03 MED ORDER — CHLORHEXIDINE GLUCONATE 0.12 % MT SOLN
15.0000 mL | Freq: Two times a day (BID) | OROMUCOSAL | Status: DC
  Administered 2015-07-03 – 2015-07-05 (×5): 15 mL via OROMUCOSAL
  Filled 2015-07-03 (×5): qty 15

## 2015-07-03 MED ORDER — TORSEMIDE 20 MG PO TABS
20.0000 mg | ORAL_TABLET | Freq: Every day | ORAL | Status: DC
  Administered 2015-07-03 – 2015-07-04 (×2): 20 mg via ORAL
  Filled 2015-07-03 (×2): qty 1

## 2015-07-03 MED ORDER — CETYLPYRIDINIUM CHLORIDE 0.05 % MT LIQD
7.0000 mL | Freq: Two times a day (BID) | OROMUCOSAL | Status: DC
  Administered 2015-07-03 – 2015-07-05 (×4): 7 mL via OROMUCOSAL

## 2015-07-03 MED ORDER — POTASSIUM CHLORIDE CRYS ER 20 MEQ PO TBCR
40.0000 meq | EXTENDED_RELEASE_TABLET | Freq: Every day | ORAL | Status: DC
  Administered 2015-07-03 – 2015-07-05 (×3): 40 meq via ORAL
  Filled 2015-07-03 (×3): qty 2

## 2015-07-03 MED ORDER — TORSEMIDE 20 MG PO TABS
40.0000 mg | ORAL_TABLET | Freq: Every day | ORAL | Status: DC
  Administered 2015-07-03 – 2015-07-05 (×3): 40 mg via ORAL
  Filled 2015-07-03 (×4): qty 2

## 2015-07-03 MED ORDER — LEVOFLOXACIN 500 MG PO TABS
500.0000 mg | ORAL_TABLET | ORAL | Status: DC
  Administered 2015-07-04: 500 mg via ORAL
  Filled 2015-07-03 (×2): qty 1

## 2015-07-03 NOTE — Progress Notes (Signed)
[facility] PROGRESS NOTE   Alexis [name] XBM:[id number] DOB: April 26, 1932 DOA: 06/30/2015 PCP: Elizabeth Palau, FNP  HPI/Subjective: Feels much better, seen with daughter at bedside. Transfer to regular bed, likely to SNF in a.m.  Assessment/Plan: Principal Problem:   Hypoglycemia associated with type 2 diabetes mellitus (HCC) Active Problems:   Chronic diastolic heart failure, NYHA class 1 (HCC)   Hypertension   COPD (chronic obstructive pulmonary disease) (HCC)   Normocytic anemia   Paroxysmal atrial fibrillation (HCC)   Hyperthyroidism   Chronic respiratory failure with hypoxia (HCC)   Closed displaced fracture of left great toe   CKD (chronic kidney disease), stage IV (HCC)   UTI (lower urinary tract infection)   Hypoglycemia associated with diabetes (HCC)   Acute on chronic diastolic congestive heart failure, NYHA class 1 (HCC)   Acute on chronic diastolic heart failure, NYHA class 1  -Patient developed shortness of breath, labored breathing overnight. -Being on normal saline at 50 mL per hour, also her preadmission Demadex held. -Chest x-ray showed pulmonary edema. -As the chest x-ray showed improvement of pulmonary edema, switch Lasix to oral.  Acute on chronic respiratory failure -Patient is on 2 L of oxygen at home, required more than 6 L of oxygen, currently on BiPAP. -This is secondary to pulmonary edema from acute on chronic diastolic CHF. -Continue on oral Demadex, follow closely.  Hypoglycemia associated with type 2 diabetes mellitus  -Etiology appears to be related to decreased clearance of oral hypoglycemic agent in patient with GFR less than 20 -Admit to floor/Obs -Permanently discontinue glyburide, patient might do okay with only diet control.  CKD, stage IV  -Renal function stable and at baseline, on Demadex at home held on admission  UTI  -Started on Keflex in the ED, will switched to levofloxacin, adjusted to renal function. -Urine  grew yeast, Diflucan added.  Hypertension -Blood pressure well controlled so continue preadmission medications except for diuretics as noted above  COPD with Chronic respiratory failure with hypoxia  -Currently compensated -Continue home dose oxygen  Paroxysmal atrial fibrillation  -Currently rate controlled with underlying bradycardia -Pharmacy to manage warfarin -CHADVASc= 6  Normocytic anemia -Hemoglobin stable and at baseline  Hyperthyroidism -Continue Tapazole -TSH and T4 normal  Closed displaced fracture of left great toe -Continue boot -PT evaluation -After discharge follow-up with orthopedic physician as instructed with current appointment in place.   Code Status: DNR Family Communication: Plan discussed with the patient. Disposition Plan: Remains inpatient Diet: Diet heart healthy/carb modified Room service appropriate?: Yes; Fluid consistency:: Thin; Fluid restriction:: 1500 mL Fluid  Consultants:  None  Procedures:  None  Antibiotics:  None   Objective: Filed Vitals:   07/03/15 0712 07/03/15 1125  BP:  148/48  Pulse:  70  Temp: 99 F (37.2 C) 98.3 F (36.8 C)  Resp:  19    Intake/Output Summary (Last 24 hours) at 07/03/15 1312 Last data filed at 07/03/15 1135  Gross per 24 hour  Intake    360 ml  Output   2700 ml  Net  -2340 ml   Filed Weights   06/30/15 0827 06/30/15 1338 07/01/15 0401  Weight: 63.504 kg (140 lb) 67.631 kg (149 lb 1.6 oz) 68.04 kg (150 lb)    Exam: General: Alert and awake, oriented x3, not in any acute distress. HEENT: anicteric sclera, pupils reactive to light and accommodation, EOMI CVS: S1-S2 clear, no murmur rubs or gallops Chest: clear to auscultation bilaterally, no wheezing, rales or rhonchi Abdomen: soft nontender, nondistended, normal  bowel sounds, no organomegaly Extremities: no cyanosis, clubbing or edema noted bilaterally Neuro: Cranial nerves II-XII intact, no focal neurological deficits  Data  Reviewed: Basic Metabolic Panel:  Recent Labs Lab 06/29/15 0610 06/30/15 0847 07/02/15 0430 07/03/15 0208  NA 141 141 139 141  K 4.7 4.8 4.3 3.7  CL 104 105 100* 99*  CO2 27 27 29 31   GLUCOSE 90 78 203* 125*  BUN 45* 46* 46* 45*  CREATININE 2.60* 2.68* 2.38* 2.40*  CALCIUM 8.6* 8.8* 8.6* 8.9   Liver Function Tests:  Recent Labs Lab 06/30/15 0847  AST 54*  ALT 30  ALKPHOS 49  BILITOT 0.3  PROT 6.2*  ALBUMIN 3.3*   No results for input(s): LIPASE, AMYLASE in the last 168 hours. No results for input(s): AMMONIA in the last 168 hours. CBC:  Recent Labs Lab 06/29/15 0610 06/30/15 0847 07/02/15 0430 07/03/15 0208  WBC 4.3 5.4 6.1 6.4  NEUTROABS 3.4 4.5  --   --   HGB 8.5* 9.0* 8.1* 7.9*  HCT 27.4* 30.1* 26.7* 25.3*  MCV 87.8 88.8 87.0 85.2  PLT 236 252 213 187   Cardiac Enzymes: No results for input(s): CKTOTAL, CKMB, CKMBINDEX, TROPONINI in the last 168 hours. BNP (last 3 results)  Recent Labs  12/21/14 0745 06/11/15 1755 06/20/15 1630  BNP 557.5* 914.1* 1716.4*    ProBNP (last 3 results) No results for input(s): PROBNP in the last 8760 hours.  CBG:  Recent Labs Lab 07/02/15 1909 07/02/15 2341 07/03/15 0337 07/03/15 0712 07/03/15 1124  GLUCAP 156* 149* 126* 118* 172*    Micro Recent Results (from the past 240 hour(s))  Urine culture     Status: None   Collection Time: 06/29/15  6:10 AM  Result Value Ref Range Status   Specimen Description URINE, CLEAN CATCH  Final   Special Requests NONE  Final   Culture MULTIPLE SPECIES PRESENT, SUGGEST RECOLLECTION  Final   Report Status 06/30/2015 FINAL  Final  MRSA PCR Screening     Status: None   Collection Time: 07/01/15 10:45 AM  Result Value Ref Range Status   MRSA by PCR NEGATIVE NEGATIVE Final    Comment:        The GeneXpert MRSA Assay (FDA approved for NASAL specimens only), is one component of a comprehensive MRSA colonization surveillance program. It is not intended to diagnose  MRSA infection nor to guide or monitor treatment for MRSA infections.      Studies: Dg Chest 2 View  07/02/2015  CLINICAL DATA:  SOB x 3 months. Hx of COPD, CHF, DM, and HTN. Pt is a nonsmoker. EXAM: CHEST  2 VIEW COMPARISON:  07/01/2015 FINDINGS: Stable enlarged cardiac silhouette. There is improvement in central venous pulmonary congestion pattern. Small effusions. Chronic bronchitic findings with hyperinflated lungs. IMPRESSION: Improvement in pulmonary edema / venous congestion pattern. Electronically Signed   By: Genevive Bi M.D.   On: 07/02/2015 13:42    Scheduled Meds: . antiseptic oral rinse  7 mL Mouth Rinse q12n4p  . atorvastatin  20 mg Oral QHS  . chlorhexidine  15 mL Mouth Rinse BID  . cholecalciferol  1,000 Units Oral q morning - 10a  . escitalopram  10 mg Oral Daily  . fenofibrate  160 mg Oral Daily  . fluconazole  100 mg Oral Daily  . gabapentin  100 mg Oral Daily  . hydrALAZINE  50 mg Oral Q12H  . insulin aspart  0-9 Units Subcutaneous 6 times per day  . isosorbide mononitrate  30 mg Oral Daily  . [START ON 07/04/2015] levofloxacin  500 mg Oral Q48H  . methimazole  5 mg Oral Daily  . metoprolol tartrate  25 mg Oral BID  . multivitamin with minerals  1 tablet Oral Daily  . potassium chloride  40 mEq Oral Daily  . torsemide  20 mg Oral Daily  . torsemide  40 mg Oral Daily  . Warfarin - Pharmacist Dosing Inpatient   Does not apply q1800   Continuous Infusions:      Time spent: 35 minutes    [facility] A  [facility] Pager [phone] If 7PM-7AM, please contact night-coverage at [email or web address], password [facility] 07/03/2015, 1:12 PM  LOS: 3 days

## 2015-07-03 NOTE — Progress Notes (Signed)
NURSING PROGRESS NOTE  Alexis [name] [id number] Transfer Data: 07/03/2015 6:40 PM Attending Provider: Clydia Llano, MD JEH:[id number], FNP Code Status: DNR   Alexis [name] is a 82 y.o. female patient transferred from 3S  -No acute distress noted.  -No complaints of shortness of breath.  -No complaints of chest pain.   Cardiac Monitoring: Box # 05 in place. Cardiac monitor yields:normal sinus rhythm.  Last Documented Vital Signs: Blood pressure 156/39, pulse 64, temperature 98.9 F (37.2 C), temperature source Oral, resp. rate 16, height 5\' 2"  (1.575 m), weight 62.4 kg (137 lb 9.1 oz), SpO2 100 %.  IV Fluids:  IV in place, occlusive dsg intact without redness, IV cath hand left, condition patent and no redness none.   Allergies:  Yellow dyes (non-tartrazine); Codeine; Other; Penicillins; and Procaine  Past Medical History:   has a past medical history of Hypertension; Neuropathy (HCC); Anxiety; PONV (postoperative nausea and vomiting); CHF (congestive heart failure) (HCC); COPD (chronic obstructive pulmonary disease) (HCC); Family history of adverse reaction to anesthesia; Cardiomyopathy (HCC); Diabetes mellitus without complication (HCC); Macular degeneration, bilateral; Varicose veins; GIB (gastrointestinal bleeding); Paroxysmal atrial fibrillation (HCC); Shortness of breath dyspnea; and Acute on chronic diastolic heart failure (HCC) (8/50/2774).  Past Surgical History:   has past surgical history that includes Hip fracture surgery (2004); Abdominal hysterectomy; Vein Surgery; and Esophagogastroduodenoscopy (N/A, 12/01/2014).  Social History:   reports that she has been passively smoking.  She has never used smokeless tobacco. She reports that she does not drink alcohol or use illicit drugs.  Skin: Discoloration to left great and second toe  Patient/Family orientated to room. Information packet given to patient/family. Admission inpatient armband information verified  with patient/family to include name and date of birth and placed on patient arm. Side rails up x 2, fall assessment and education completed with patient/family. Patient/family able to verbalize understanding of risk associated with falls and verbalized understanding to call for assistance before getting out of bed. Call light within reach. Patient/family able to voice and demonstrate understanding of unit orientation instructions.

## 2015-07-03 NOTE — Progress Notes (Signed)
Physical Therapy Treatment Patient Details Name: Alexis [name] MRN: [id number] DOB: 08/02/1931 Today's Date: 07/03/2015    History of Present Illness 82 year old female with history of diastolic heart failure, atrial fibrillation, COPD, diabetes mellitus, that presented to the emergency department with complaints of hypoglycemia. Respiratory distress on 2/4 with transfer to [facility] unit    PT Comments    Alexis [name] appears discouraged, provided encouragement and motivation and pt reports her goal is to work on her breathing.  Min assist for bed mobility and min guard assist for safe ambulation.  SpO2 down to 87% ambulating on RA.  Pt will benefit from continued skilled PT services to increase functional independence and safety.    Follow Up Recommendations  SNF     Equipment Recommendations  None recommended by PT    Recommendations for Other Services OT consult     Precautions / Restrictions Precautions Precautions: Fall Precaution Comments: monitor O2 Restrictions Weight Bearing Restrictions: No    Mobility  Bed Mobility Overal bed mobility: Needs Assistance Bed Mobility: Supine to Sit     Supine to sit: Min assist     General bed mobility comments: Min assist to manage Bil LEs initially due to pt fatigue.  Use of bed rail w/ HOB elevated and increased time.  Cues to scoot to EOB.  Transfers Overall transfer level: Needs assistance Equipment used: Rolling walker (2 wheeled) Transfers: Sit to/from Stand Sit to Stand: Min guard         General transfer comment: Cues for safety and hand placement  Ambulation/Gait Ambulation/Gait assistance: Min guard Ambulation Distance (Feet): 40 Feet Assistive device: Rolling walker (2 wheeled) Gait Pattern/deviations: Step-through pattern;Decreased stride length;Trunk flexed     General Gait Details: Cues for upright posture to increase lung capacity and for pursed lip breathing.  Pt fatigues quickly and requests  to return to room.  SpO2 down to 87% on RA.  Donned 2 LPM O2 via [place] and SpO2 returned to mid 90's.    Stairs            Wheelchair Mobility    Modified Rankin (Stroke Patients Only)       Balance Overall balance assessment: Needs assistance Sitting-balance support: Bilateral upper extremity supported;Feet supported Sitting balance-Leahy Scale: Fair     Standing balance support: Bilateral upper extremity supported;During functional activity Standing balance-Leahy Scale: Fair Standing balance comment: RW for support                    Cognition Arousal/Alertness: Awake/alert Behavior During Therapy: Flat affect Overall Cognitive Status: Within Functional Limits for tasks assessed                      Exercises General Exercises - Lower Extremity Ankle Circles/Pumps: AROM;Both;10 reps;Seated Long Arc Quad: AROM;Both;10 reps;Seated    General Comments General comments (skin integrity, edema, etc.): Pt presents as discouraged.  Encouraged pt to continue pushing herself to make progress.  Pt was agreeable to speak w/ Palliative Care for goals of care and would like a visit from the Chaplain.  RN placed both orders.      Pertinent Vitals/Pain Pain Assessment: No/denies pain    Home Living                      Prior Function            PT Goals (current goals can now be found in the care plan section) Acute Rehab  PT Goals Patient Stated Goal: to work on her breathing PT Goal Formulation: With patient Time For Goal Achievement: 07/16/15 Potential to Achieve Goals: Good Progress towards PT goals: Progressing toward goals    Frequency  Min 3X/week    PT Plan Current plan remains appropriate    Co-evaluation             End of Session Equipment Utilized During Treatment: Gait belt;Oxygen Activity Tolerance: Patient tolerated treatment well;Patient limited by fatigue Patient left: in chair;with call bell/phone within reach;with  family/visitor present     Time: 1257-1330 PT Time Calculation (min) (ACUTE ONLY): 33 min  Charges:  $Gait Training: 8-22 mins $Therapeutic Exercise: 8-22 mins                    G Codes:      Michail Jewels PT, [place] [phone] Pager: [phone] 07/03/2015, 1:58 PM

## 2015-07-03 NOTE — Progress Notes (Signed)
ANTICOAGULATION CONSULT NOTE  Pharmacy Consult for Warfarin Indication: atrial fibrillation  Allergies  Allergen Reactions  . Yellow Dyes (Non-Tartrazine) Nausea And Vomiting    "deathly allergic" No other information given to explain reaction.  . Codeine Hives  . Other Other (See Comments)    novocaine broke mouth out  . Penicillins Hives  . Procaine Hives    Patient Measurements: Height: 5\' 2"  (157.5 cm) Weight: 150 lb (68.04 kg) IBW/kg (Calculated) : 50.1  Vital Signs: Temp: 99 F (37.2 C) (02/06 0712) Temp Source: Oral (02/06 0712) BP: 143/47 mmHg (02/06 0340) Pulse Rate: 71 (02/06 0340)  Labs:  Recent Labs  07/01/15 0343 07/02/15 0430 07/03/15 0208  HGB  --  8.1* 7.9*  HCT  --  26.7* 25.3*  PLT  --  213 187  LABPROT 18.4* 28.5* 33.7*  INR 1.53* 2.73* 3.41*  CREATININE  --  2.38* 2.40*    Estimated Creatinine Clearance: 16.6 mL/min (by C-G formula based on Cr of 2.4).   Medical History: Past Medical History  Diagnosis Date  . Hypertension   . Neuropathy (HCC)   . Anxiety   . PONV (postoperative nausea and vomiting)   . CHF (congestive heart failure) (HCC)   . COPD (chronic obstructive pulmonary disease) (HCC)     on home, nocturnal oxygen.   . Family history of adverse reaction to anesthesia   . Cardiomyopathy (HCC)   . Diabetes mellitus without complication (HCC)     TYPE 2  . Macular degeneration, bilateral   . Varicose veins   . GIB (gastrointestinal bleeding)   . Paroxysmal atrial fibrillation (HCC)   . Shortness of breath dyspnea   . Acute on chronic diastolic heart failure (HCC) 12/22/2014   Assessment: 82 yr old female admitted with hypoglycemia. To continue warfarin for atrial fibrillation. DDI: Levaquin and Fluconazole may increase INR.   INR therapuetic at 2.73>>3.41 (likely due to DDI)  No s/sx bleeding noted.  Goal of Therapy:  INR 2-3 Monitor platelets by anticoagulation protocol: Yes   Plan:  Hold warfarin tonight  x1 Daily PT/INR CBC Q3days Monitor for s/sx of bleeding  Arlean Hopping. Newman Pies, PharmD, BCPS Clinical Pharmacist Pager [phone]  07/03/2015,9:47 AM

## 2015-07-03 NOTE — Progress Notes (Signed)
Pt provided an incentive spirometer and instructed on use of device in helping prevent pneumonia.  Pt eager and willing to learn use of and performed return demonstrations.  Maximum reached once during use was 750.  Majority of additional attempts resulted in 500.

## 2015-07-03 NOTE — Care Management Note (Signed)
Case Management Note  Patient Details  Name: Alexis [name] MRN: [id number] Date of Birth: 05/27/1932  Subjective/Objective:     Date: 07/03/15 Spoke with patient son Onalee Hua [phone] who patient lives with at bedside.  Introduced self as Sports coach and explained role in discharge planning and how to be reached.  Verified patient lives in town, with Onalee Hua. Has DME rolling walker,bsc and shower seat.  Expressed potential need for no other DME.  PT eval rec SNF for patient, Son would like to speake with CSW ,regarding SNF at time of discharge. Patient has insurance and has no problems getting medications.  Patient  is driven by daughter to MD appointments.  Verified patient has PCP Dareen Piano.   Plan: CM will continue to follow for discharge planning and [facility] resources.                Action/Plan:   Expected Discharge Date:                  Expected Discharge Plan:  Skilled Nursing Facility  In-House Referral:  Clinical Social Work  Discharge planning Services  CM Consult  Post Acute Care Choice:    Choice offered to:     DME Arranged:    DME Agency:     HH Arranged:    HH Agency:     Status of Service:  In process, will continue to follow  Medicare Important Message Given:    Date Medicare IM Given:    Medicare IM give by:    Date Additional Medicare IM Given:    Additional Medicare Important Message give by:     If discussed at Long Length of Stay Meetings, dates discussed:    Additional Comments:  Leone Haven, RN 07/03/2015, 12:45 PM

## 2015-07-03 NOTE — Progress Notes (Signed)
   07/03/15 1500  Clinical Encounter Type  Visited With Patient  Visit Type Initial;Psychological support;Spiritual support;Social support  Referral From Nurse  Spiritual Encounters  Spiritual Needs Emotional;Prayer  [facility] visited with pt and had pleasant conversation; pt initially quiet but opened up and was generally joyful at conclusion; pt not eating but if given choice, would like chocolate ice cream (if permitted); CH intends to follow-up and meet with family. 3:21 PM. Erline Levine

## 2015-07-03 NOTE — Progress Notes (Signed)
Report called to RN, Hansel Starling, for 5 west room 18.

## 2015-07-04 LAB — BASIC METABOLIC PANEL
ANION GAP: 14 (ref 5–15)
BUN: 44 mg/dL — ABNORMAL HIGH (ref 6–20)
CALCIUM: 9.3 mg/dL (ref 8.9–10.3)
CO2: 33 mmol/L — AB (ref 22–32)
Chloride: 96 mmol/L — ABNORMAL LOW (ref 101–111)
Creatinine, Ser: 2.19 mg/dL — ABNORMAL HIGH (ref 0.44–1.00)
GFR, EST AFRICAN AMERICAN: 23 mL/min — AB (ref >60)
GFR, EST NON AFRICAN AMERICAN: 20 mL/min — AB (ref >60)
Glucose, Bld: 132 mg/dL — ABNORMAL HIGH (ref 65–99)
Potassium: 3.3 mmol/L — ABNORMAL LOW (ref 3.5–5.1)
Sodium: 143 mmol/L (ref 135–145)

## 2015-07-04 LAB — GLUCOSE, CAPILLARY
GLUCOSE-CAPILLARY: 128 mg/dL — AB (ref 65–99)
GLUCOSE-CAPILLARY: 129 mg/dL — AB (ref 65–99)
GLUCOSE-CAPILLARY: 155 mg/dL — AB (ref 65–99)
GLUCOSE-CAPILLARY: 155 mg/dL — AB (ref 65–99)
GLUCOSE-CAPILLARY: 161 mg/dL — AB (ref 65–99)
Glucose-Capillary: 109 mg/dL — ABNORMAL HIGH (ref 65–99)

## 2015-07-04 LAB — CBC
HCT: 29.9 % — ABNORMAL LOW (ref 36.0–46.0)
HEMOGLOBIN: 9 g/dL — AB (ref 12.0–15.0)
MCH: 25.4 pg — ABNORMAL LOW (ref 26.0–34.0)
MCHC: 30.1 g/dL (ref 30.0–36.0)
MCV: 84.5 fL (ref 78.0–100.0)
Platelets: 251 10*3/uL (ref 150–400)
RBC: 3.54 MIL/uL — AB (ref 3.87–5.11)
RDW: 16.2 % — ABNORMAL HIGH (ref 11.5–15.5)
WBC: 5.4 10*3/uL (ref 4.0–10.5)

## 2015-07-04 LAB — PROTIME-INR
INR: 2.91 — AB (ref 0.00–1.49)
PROTHROMBIN TIME: 29.9 s — AB (ref 11.6–15.2)

## 2015-07-04 MED ORDER — MAGNESIUM SULFATE 2 GM/50ML IV SOLN
2.0000 g | Freq: Once | INTRAVENOUS | Status: AC
  Administered 2015-07-04: 2 g via INTRAVENOUS
  Filled 2015-07-04: qty 50

## 2015-07-04 MED ORDER — POTASSIUM CHLORIDE CRYS ER 20 MEQ PO TBCR
40.0000 meq | EXTENDED_RELEASE_TABLET | Freq: Once | ORAL | Status: AC
  Administered 2015-07-04: 40 meq via ORAL
  Filled 2015-07-04: qty 2

## 2015-07-04 MED ORDER — WARFARIN 0.5 MG HALF TABLET
0.5000 mg | ORAL_TABLET | Freq: Once | ORAL | Status: AC
  Administered 2015-07-04: 0.5 mg via ORAL
  Filled 2015-07-04: qty 1

## 2015-07-04 MED ORDER — TORSEMIDE 20 MG PO TABS: ORAL_TABLET | ORAL | Status: DC

## 2015-07-04 MED ORDER — HYDRALAZINE HCL 20 MG/ML IJ SOLN
5.0000 mg | Freq: Once | INTRAMUSCULAR | Status: AC
  Administered 2015-07-04: 5 mg via INTRAVENOUS
  Filled 2015-07-04: qty 1

## 2015-07-04 NOTE — Care Management Important Message (Signed)
Important Message  Patient Details  Name: CANDIES SEJOUR MRN: [id number] Date of Birth: 12/17/1931   Medicare Important Message Given:  Yes    Rayvon Char 07/04/2015, 10:42 AMImportant Message  Patient Details  Name: ROMOLA WORKS MRN: [id number] Date of Birth: 07-13-1931   Medicare Important Message Given:  Yes    Ananth Fiallos G 07/04/2015, 10:42 AM

## 2015-07-04 NOTE — Progress Notes (Signed)
Discharge canceled. Given potassium today we'll check potassium in a.m. Follow-up renal function and CBC as she has anemia as well. Likely to discharge in a.m. to a nursing home  Clint Lipps Pager: [phone] 07/04/2015, 4:43 PM

## 2015-07-04 NOTE — Discharge Summary (Signed)
Physician Discharge Summary  Alexis [name] VFI:[id number] DOB: 07/06/1931 DOA: 06/30/2015  PCP: Elizabeth Palau, FNP  Admit date: 06/30/2015 Discharge date: 07/04/2015  Time spent: 40 minutes  Recommendations for Outpatient Follow-up:  1. Follow up with the nursing home M.D. 2. Torsemide increased 40 mg in a.m. and 20 mg p.m. 3. Follow BMP closely. 4. Refer to cardiology as outpatient.   Discharge Diagnoses:  Principal Problem:   Hypoglycemia associated with type 2 diabetes mellitus (HCC) Active Problems:   Chronic diastolic heart failure, NYHA class 1 (HCC)   Hypertension   COPD (chronic obstructive pulmonary disease) (HCC)   Normocytic anemia   Paroxysmal atrial fibrillation (HCC)   Hyperthyroidism   Chronic respiratory failure with hypoxia (HCC)   Closed displaced fracture of left great toe   CKD (chronic kidney disease), stage IV (HCC)   UTI (lower urinary tract infection)   Hypoglycemia associated with diabetes (HCC)   Acute on chronic diastolic congestive heart failure, NYHA class 1 (HCC)   Discharge Condition: Stable  Diet recommendation: Carbohydrate modified/heart healthy diet, restrict fluids to 1500 mL per day  [facility] Weights   06/30/15 1338 07/01/15 0401 07/03/15 1828  Weight: 67.631 kg (149 lb 1.6 oz) 68.04 kg (150 lb) 62.4 kg (137 lb 9.1 oz)    History of present illness:  82 year old female patient recently discharged on 1/27 admission for diastolic heart failure. Patient has underlying paroxysmal A. fib on warfarin, O2 dependent COPD (secondhand smoke exposure), chronic kidney disease stage IV diabetes on oral agents, hypertension, hyperthyroidism on Tapazole who presents to the hospital and the second time in the past 36 hours with symptomatic hypoglycemia. She initially presented to the ER on 2/2 after being found to have CBGs less than 30. Patient's son has been caring for his mother and has been attempting to feed her high carbohydrate high-protein foods  to keep her sugar up. EMS was called to the home 3 separate occasions in the past 24 hours prior to yesterday's visit and the patient refused transport once her glucose corrected. There are conflicting reports about when the patient last took her glipizide according to the ER physician's note from 2/2 the last dose had been on 2/1 at 6 PM. Other family members present today think the patient has not taken glipizide for at least 3-4 days. While being evaluated in the emergency department was determined patient had urinary tract infection and had begin a prescription for Keflex but this had not yet been filled. Over the past few weeks patient has been sleeping more frequently than usual and forgets to eat. At time she has forgotten foods on th the stove. Today on patient became less responsive and was noted her sugar was low again. EMS evaluated the patient at the home of her glucose was found to be 35 so she was given an amp of D50. No other symptoms associated. She was transported to [facility] ER.  Hospital Course:   Acute on chronic diastolic heart failure, NYHA class 1  -Patient developed shortness of breath, labored breathing in the night after admission -She was on normal saline at 50 mL per hour, also her preadmission Demadex held. -Chest x-ray showed pulmonary edema. -This is treated aggressively with IV diuretics, on discharge Demadex increased 40 mg in a.m. and 20 mg at p.m. -Follow weight closely, increase diuretics as needed in the nursing home.  Acute on chronic respiratory failure -Patient is on 2 L of oxygen at home, required more than 6 L of oxygen, currently  on BiPAP. -This is secondary to pulmonary edema from acute on chronic diastolic CHF. -Continue on oral Demadex, follow closely.  Hypoglycemia associated with type 2 diabetes mellitus  -Etiology appears to be related to decreased clearance of oral hypoglycemic agent in patient with GFR less than 20 -Admit to floor/Obs -Permanently  discontinue glyburide, patient likely to do okay with only diet control.  CKD, stage IV  -Renal function stable and at baseline, on Demadex at home held on admission  UTI  -Started on Keflex in the ED, will switched to levofloxacin, adjusted to renal function. -Urine grew yeast,  Diflucan added, no need for antibiotic or antifungal on discharge.  Hypertension -Blood pressure well controlled so continue preadmission medications except for diuretics as noted above  COPD with Chronic respiratory failure with hypoxia  -Currently compensated -On 2 L of oxygen at home, continued.  Paroxysmal atrial fibrillation  -Currently rate controlled with underlying bradycardia -Pharmacy to manage warfarin, INR is 2.9 on discharge  -CHADVASc= 6  Normocytic anemia -Hemoglobin stable and at baseline  Hyperthyroidism -Continue Tapazole -TSH and T4 normal  Closed displaced fracture of left great toe -Continue boot -PT evaluation -After discharge follow-up with orthopedic physician as instructed with current appointment in place.   Procedures:  None  Consultations:  None  Discharge Exam: Filed Vitals:   07/04/15 1016 07/04/15 1417  BP: 145/72 143/41  Pulse:  62  Temp:  98.2 F (36.8 C)  Resp:  16   General: Alert and awake, oriented x3, not in any acute distress. HEENT: anicteric sclera, pupils reactive to light and accommodation, EOMI CVS: S1-S2 clear, no murmur rubs or gallops Chest: clear to auscultation bilaterally, no wheezing, rales or rhonchi Abdomen: soft nontender, nondistended, normal bowel sounds, no organomegaly Extremities: no cyanosis, clubbing or edema noted bilaterally Neuro: Cranial nerves II-XII intact, no focal neurological deficits  Discharge Instructions   Discharge Instructions    Diet - low sodium heart healthy    Complete by:  As directed      Increase activity slowly    Complete by:  As directed           Current Discharge Medication List     CONTINUE these medications which have CHANGED   Details  torsemide (DEMADEX) 20 MG tablet 40 mg in a.m. and 20 mg and p.m.      CONTINUE these medications which have NOT CHANGED   Details  albuterol (PROVENTIL HFA;VENTOLIN HFA) 108 (90 BASE) MCG/ACT inhaler Inhale 2 puffs into the lungs every 6 (six) hours as needed for wheezing or shortness of breath. Qty: 1 Inhaler, Refills: 3    aspirin EC 81 MG tablet Take 81 mg by mouth every 6 (six) hours as needed for moderate pain.    atorvastatin (LIPITOR) 20 MG tablet Take 20 mg by mouth at bedtime.    cholecalciferol (VITAMIN D) 1000 UNITS tablet Take 1,000 Units by mouth every morning.     escitalopram (LEXAPRO) 10 MG tablet Take 10 mg by mouth daily.    fenofibrate 160 MG tablet Take 160 mg by mouth daily.    gabapentin (NEURONTIN) 100 MG capsule Take 1 capsule (100 mg total) by mouth daily. Qty: 30 capsule, Refills: 0    hydrALAZINE (APRESOLINE) 50 MG tablet Take 1 tablet (50 mg total) by mouth every 12 (twelve) hours. Qty: 60 tablet, Refills: 2    isosorbide mononitrate (IMDUR) 30 MG 24 hr tablet TAKE 1 TABLET BY MOUTH EVERY DAY Qty: 30 tablet, Refills: 6  methimazole (TAPAZOLE) 5 MG tablet Take 1 tablet (5 mg total) by mouth 3 (three) times a week. Qty: 30 tablet, Refills: 2    metoprolol tartrate (LOPRESSOR) 25 MG tablet TAKE 1 TABLET BY MOUTH TWICE A DAY Qty: 60 tablet, Refills: 9    Multiple Vitamin (MULTIVITAMIN WITH MINERALS) TABS tablet Take 1 tablet by mouth daily.    potassium chloride SA (K-DUR,KLOR-CON) 20 MEQ tablet Take 1 tablet (20 mEq total) by mouth daily. Qty: 60 tablet, Refills: 2    warfarin (COUMADIN) 2.5 MG tablet Take 1 tablet (2.5 mg total) by mouth daily at 6 PM. Qty: 30 tablet, Refills: 2    OXYGEN Inhale 3 L into the lungs at bedtime.       STOP taking these medications     glipiZIDE (GLUCOTROL XL) 5 MG 24 hr tablet      cephALEXin (KEFLEX) 500 MG capsule        Allergies  Allergen  Reactions  . Yellow Dyes (Non-Tartrazine) Nausea And Vomiting    "deathly allergic" No other information given to explain reaction.  . Codeine Hives  . Other Other (See Comments)    novocaine broke mouth out  . Penicillins Hives  . Procaine Hives   Follow-up Information    Schedule an appointment as soon as possible for a visit with Elizabeth Palau, FNP.   Specialty:  Nurse Practitioner   Contact information:   [address] [place] [address] [phone]       Follow up with [facility] EMERGENCY DEPARTMENT.   Specialty:  Emergency Medicine   Why:  As needed, If symptoms worsen   Contact information:   [address] [id number] mc [place] [address] [phone]       The results of significant diagnostics from this hospitalization (including imaging, microbiology, ancillary and laboratory) are listed below for reference.    Significant Diagnostic Studies: Dg Chest 2 View  07/02/2015  CLINICAL DATA:  SOB x 3 months. Hx of COPD, CHF, DM, and HTN. Pt is a nonsmoker. EXAM: CHEST  2 VIEW COMPARISON:  07/01/2015 FINDINGS: Stable enlarged cardiac silhouette. There is improvement in central venous pulmonary congestion pattern. Small effusions. Chronic bronchitic findings with hyperinflated lungs. IMPRESSION: Improvement in pulmonary edema / venous congestion pattern. Electronically Signed   By: Genevive Bi M.D.   On: 07/02/2015 13:42   X-ray Chest Pa And Lateral  06/21/2015  CLINICAL DATA:  Follow-up pneumonia.  Subsequent encounter. EXAM: CHEST  2 VIEW COMPARISON:  Chest radiograph performed 06/20/2015 FINDINGS: Small bilateral pleural effusions are seen. Patchy bilateral airspace opacification likely reflects worsening pulmonary edema, though pneumonia cannot be excluded given clinical concern. Underlying vascular congestion is seen. No pneumothorax identified. The cardiomediastinal silhouette is enlarged. No acute osseous  abnormalities identified. IMPRESSION: Small bilateral pleural effusions noted. Patchy bilateral airspace opacification likely reflects worsening pulmonary edema, though pneumonia cannot be excluded given clinical concern. Underlying vascular congestion and cardiomegaly. Electronically Signed   By: Roanna Raider M.D.   On: 06/21/2015 00:35   Dg Chest Port 1 View  07/01/2015  CLINICAL DATA:  Shortness of breath. EXAM: PORTABLE CHEST 1 VIEW COMPARISON:  June 21, 2015. FINDINGS: Stable cardiomegaly. No pneumothorax or significant pleural effusion is noted. Mildly increased interstitial densities are noted throughout the lungs concerning for pulmonary edema. Bony thorax is unremarkable. IMPRESSION: Cardiomegaly and increased interstitial densities are noted bilaterally concerning for pulmonary edema. Electronically Signed   By: Lupita Raider, M.D.  On: 07/01/2015 10:44   Dg Chest Port 1 View  06/20/2015  CLINICAL DATA:  Respiratory distress, shortness of breath, fluid overload, history CHF, asthma, COPD, diabetes mellitus, cardiomyopathy, hypertension, paroxysmal atrial fibrillation EXAM: PORTABLE CHEST 1 VIEW COMPARISON:  Portable exam 1540 hours compared to 06/13/2015 FINDINGS: Enlargement of cardiac silhouette with pulmonary vascular congestion. Interstitial infiltrates bilaterally compatible with pulmonary edema and CHF, little changed when accounting for differences in technique. Suspect small RIGHT basilar pleural effusion. No pneumothorax. Bones demineralized. IMPRESSION: Persistent CHF and suspect small RIGHT pleural effusion, little changed. Electronically Signed   By: Ulyses Southward M.D.   On: 06/20/2015 16:18   Dg Chest Port 1 View  06/13/2015  CLINICAL DATA:  Sudden onset of shortness of breath, congestion P EXAM: PORTABLE CHEST 1 VIEW COMPARISON:  06/11/2015 FINDINGS: Cardiomegaly with vascular congestion and bilateral airspace opacities most compatible with edema/CHF. No significant effusions.  No acute bony abnormality. IMPRESSION: Moderate CHF changes, stable since prior study. Electronically Signed   By: Charlett Nose M.D.   On: 06/13/2015 21:16   Dg Chest Portable 1 View  06/11/2015  CLINICAL DATA:  Shortness of breath EXAM: PORTABLE CHEST 1 VIEW COMPARISON:  12/22/2014 chest radiograph. FINDINGS: Stable cardiomediastinal silhouette with mild cardiomegaly. No pneumothorax. Likely small right pleural effusion. No left pleural effusion. Diffuse fluffy and linear parahilar opacities throughout both lungs, worsened in the interval, probably moderate pulmonary edema. IMPRESSION: Mild cardiomegaly with diffuse fluffy and linear parahilar opacities throughout both lungs, favor moderate pulmonary edema due to moderate congestive heart failure. Small right pleural effusion. Electronically Signed   By: Delbert Phenix M.D.   On: 06/11/2015 17:57   Dg Foot Complete Left  06/21/2015  CLINICAL DATA:  82 year old female with injury to the left foot and big toe with soft tissue wound. Diabetic. Initial encounter. EXAM: LEFT FOOT - COMPLETE 3+ VIEW COMPARISON:  None. FINDINGS: Sip dressing material about the left great toe. There is a mildly impacted but otherwise nondisplaced transverse fracture through the left big toe distal phalanx affecting the base, extra-articular. Great toe IP joint appears normal. First proximal phalanx and metatarsal are intact. MT P and IP joint subluxation elsewhere in the left foot. Probable healed fractures of the fourth and fifth proximal phalanges. No other acute fracture or dislocation identified. Pes planus. Small chronic appearing ossific fragments at the medial navicular and anterior inferior calcaneus. The calcaneus appears intact. No subcutaneous gas. IMPRESSION: Nondisplaced extra-articular fracture through the base of the left great toe distal phalanx. No other acute osseous abnormality identified. Electronically Signed   By: Odessa Fleming M.D.   On: 06/21/2015 10:25     Microbiology: Recent Results (from the past 240 hour(s))  Urine culture     Status: None   Collection Time: 06/29/15  6:10 AM  Result Value Ref Range Status   Specimen Description URINE, CLEAN CATCH  Final   Special Requests NONE  Final   Culture MULTIPLE SPECIES PRESENT, SUGGEST RECOLLECTION  Final   Report Status 06/30/2015 FINAL  Final  MRSA PCR Screening     Status: None   Collection Time: 07/01/15 10:45 AM  Result Value Ref Range Status   MRSA by PCR NEGATIVE NEGATIVE Final    Comment:        The GeneXpert MRSA Assay (FDA approved for NASAL specimens only), is one component of a comprehensive MRSA colonization surveillance program. It is not intended to diagnose MRSA infection nor to guide or monitor treatment for MRSA infections.  Labs: Basic Metabolic Panel:  Recent Labs Lab 06/29/15 0610 06/30/15 0847 07/02/15 0430 07/03/15 0208 07/04/15 0715  NA 141 141 139 141 143  K 4.7 4.8 4.3 3.7 3.3*  CL 104 105 100* 99* 96*  CO2 27 27 29 31  33*  GLUCOSE 90 78 203* 125* 132*  BUN 45* 46* 46* 45* 44*  CREATININE 2.60* 2.68* 2.38* 2.40* 2.19*  CALCIUM 8.6* 8.8* 8.6* 8.9 9.3   Liver Function Tests:  Recent Labs Lab 06/30/15 0847  AST 54*  ALT 30  ALKPHOS 49  BILITOT 0.3  PROT 6.2*  ALBUMIN 3.3*   No results for input(s): LIPASE, AMYLASE in the last 168 hours. No results for input(s): AMMONIA in the last 168 hours. CBC:  Recent Labs Lab 06/29/15 0610 06/30/15 0847 07/02/15 0430 07/03/15 0208 07/04/15 0715  WBC 4.3 5.4 6.1 6.4 5.4  NEUTROABS 3.4 4.5  --   --   --   HGB 8.5* 9.0* 8.1* 7.9* 9.0*  HCT 27.4* 30.1* 26.7* 25.3* 29.9*  MCV 87.8 88.8 87.0 85.2 84.5  PLT 236 252 213 187 251   Cardiac Enzymes: No results for input(s): CKTOTAL, CKMB, CKMBINDEX, TROPONINI in the last 168 hours. BNP: BNP (last 3 results)  Recent Labs  12/21/14 0745 06/11/15 1755 06/20/15 1630  BNP 557.5* 914.1* 1716.4*    ProBNP (last 3 results) No results  for input(s): PROBNP in the last 8760 hours.  CBG:  Recent Labs Lab 07/03/15 1957 07/04/15 0016 07/04/15 0535 07/04/15 0800 07/04/15 1244  GLUCAP 161* 129* 161* 109* 155*       Signed:  Clydia Llano A MD.  [facility] 07/04/2015, 2:36 PM

## 2015-07-04 NOTE — Clinical Social Work Note (Signed)
Clinical Social Work Assessment  Patient Details  Name: Alexis [name] MRN: [id number] Date of Birth: 03/22/1932  Date of referral:  07/04/15               Reason for consult:  Facility Placement                Permission sought to share information with:  Facility Sport and exercise psychologist, Family Supports Permission granted to share information::  Yes, Verbal Permission Granted  Name::     Sharyn Lull  Agency::  [facility]  Relationship::  Daughter  Contact Information:  [phone]  Housing/Transportation Living arrangements for the past 2 months:  [place] of Information:  Patient, Adult Children Patient Interpreter Needed:  None Criminal Activity/Legal Involvement Pertinent to Current Situation/Hospitalization:  No - Comment as needed Significant Relationships:  Adult Children Lives with:  Adult Children Do you feel safe going back to the place where you live?  No Need for family participation in patient care:  Yes (Comment)  Care giving concerns:  CSW received referral for possible SNF placement at time of discharge. CSW met with patient and patient's daughter at bedside regarding PT recommendation of SNF placement at time of discharge. Per patient's daughter, patient lives with patient's son and he is currently unable to care for patient at their home given patient's current physical needs and fall risk. Patient and patient's daughter expressed understanding of PT recommendation and are agreeable to SNF placement at time of discharge. CSW to continue to follow and assist with discharge planning needs.   Social Worker assessment / plan: CSW spoke with patient and patient's daughter concerning possibility of rehab at [facility] before returning home.  Employment status:  Retired Nurse, adult PT Recommendations:  [place] / Referral to community resources:  [place]  Patient/Family's  Response to care:  Patient and patient's daughter recognize need for rehab before returning home and are agreeable to a SNF in [place]. Patient reported preference for somewhere close to the hospital like [place].  Patient/Family's Understanding of and Emotional Response to Diagnosis, Current Treatment, and Prognosis:  Patient is realistic regarding therapy needs. No questions/concerns about plan or treatment.    Emotional Assessment Appearance:  Appears older than stated age Attitude/Demeanor/Rapport:   (Appropriate) Affect (typically observed):  Accepting, Appropriate, Quiet Orientation:  Oriented to Self, Oriented to Place, Oriented to  Time, Oriented to Situation Alcohol / Substance use:  Not Applicable Psych involvement (Current and /or in the community):  No (Comment)  Discharge Needs  Concerns to be addressed:  Care Coordination Readmission within the last 30 days:  No Current discharge risk:  None Barriers to Discharge:  Continued Medical Work up   [employer], [place] 07/04/2015, 1:24 PM

## 2015-07-04 NOTE — Progress Notes (Signed)
ANTICOAGULATION CONSULT NOTE  Pharmacy Consult for Warfarin Indication: atrial fibrillation  Allergies  Allergen Reactions  . Yellow Dyes (Non-Tartrazine) Nausea And Vomiting    "deathly allergic" No other information given to explain reaction.  . Codeine Hives  . Other Other (See Comments)    novocaine broke mouth out  . Penicillins Hives  . Procaine Hives    Patient Measurements: Height: 5\' 2"  (157.5 cm) Weight: 137 lb 9.1 oz (62.4 kg) IBW/kg (Calculated) : 50.1  Vital Signs: Temp: 98.1 F (36.7 C) (02/07 0535) Temp Source: Oral (02/07 0535) BP: 180/60 mmHg (02/07 0540) Pulse Rate: 62 (02/06 2207)  Labs:  Recent Labs  07/02/15 0430 07/03/15 0208  HGB 8.1* 7.9*  HCT 26.7* 25.3*  PLT 213 187  LABPROT 28.5* 33.7*  INR 2.73* 3.41*  CREATININE 2.38* 2.40*    Estimated Creatinine Clearance: 16 mL/min (by C-G formula based on Cr of 2.4).   Medical History: Past Medical History  Diagnosis Date  . Hypertension   . Neuropathy (HCC)   . Anxiety   . PONV (postoperative nausea and vomiting)   . CHF (congestive heart failure) (HCC)   . COPD (chronic obstructive pulmonary disease) (HCC)     on home, nocturnal oxygen.   . Family history of adverse reaction to anesthesia   . Cardiomyopathy (HCC)   . Diabetes mellitus without complication (HCC)     TYPE 2  . Macular degeneration, bilateral   . Varicose veins   . GIB (gastrointestinal bleeding)   . Paroxysmal atrial fibrillation (HCC)   . Shortness of breath dyspnea   . Acute on chronic diastolic heart failure (HCC) 12/22/2014   Assessment: 82 yr old female admitted with hypoglycemia. To continue warfarin for atrial fibrillation (last dose 2/2 pta). DDI: Levaquin and Fluconazole may increase INR.   Warfarin held 2/4-2/5 with supratherapeutic INR with large jump (likely due to DDI), now back in therapuetic range 2.91. Hg 9 (up), plt wnl. No s/sx bleeding noted. Will give small dose tonight to prevent INR from  dropping too low.  Goal of Therapy:  INR 2-3 Monitor platelets by anticoagulation protocol: Yes   Plan:  Warfarin 0.5mg  x 1 dose tonight Daily PT/INR Monitor CBC, s/sx of bleeding  Babs Bertin, PharmD, [facility] Clinical Pharmacist Pager [phone] 07/04/2015 8:53 AM

## 2015-07-04 NOTE — Progress Notes (Signed)
Per [place] CSW, family declining SNF, will assess for home care. Attempt to call daughter, no answer, unidentified VM, will try to contact tomorrow to set up [facility].

## 2015-07-04 NOTE — [place] FL2 (Signed)
[place] MEDICAID FL2 LEVEL OF CARE SCREENING TOOL     IDENTIFICATION  Patient Name: Alexis [name] Birthdate: July 17, 1931 Sex: female Admission Date (Current Location): 06/30/2015  [facility] and [place] Number:  Producer, television/film/video and Address:  [facility] [place], [address] [place], [place] [address]      Provider Number: [id number]  Attending Physician Name and Address:  Clydia Llano, MD  Relative Name and Phone Number:  Donnamarie Poag daughter, [phone]    Current Level of Care: Hospital Recommended Level of Care: Skilled Nursing Facility Prior Approval Number:    Date Approved/Denied:   PASRR Number: [id number]  Discharge Plan: SNF    Current Diagnoses: Patient Active Problem List   Diagnosis Date Noted  . Acute on chronic diastolic congestive heart failure, NYHA class 1 (HCC) 07/01/2015  . Chronic respiratory failure with hypoxia (HCC) 06/30/2015  . Hypoglycemia associated with type 2 diabetes mellitus (HCC) 06/30/2015  . Closed displaced fracture of left great toe 06/30/2015  . CKD (chronic kidney disease), stage IV (HCC) 06/30/2015  . UTI (lower urinary tract infection) 06/30/2015  . Hypoglycemia associated with diabetes (HCC) 06/30/2015  . Hyperthyroidism 01/23/2015  . Long-term (current) use of anticoagulants 12/12/2014  . Paroxysmal atrial fibrillation (HCC) 12/01/2014  . Melena   . Pressure ulcer 11/30/2014  . Normocytic anemia 11/29/2014  . COPD (chronic obstructive pulmonary disease) (HCC) 07/12/2014  . (HFpEF) heart failure with preserved ejection fraction (HCC) 07/16/2013  . Hypertension 06/10/2013  . Chronic diastolic heart failure, NYHA class 1 (HCC) 06/09/2013    Orientation RESPIRATION BLADDER Height & Weight     Place, Situation, Time, Self  O2 Continent, Indwelling catheter (Urinary catheter) Weight: 137 lb 9.1 oz (62.4 kg) Height:  5\' 2"  (157.5 cm)  BEHAVIORAL SYMPTOMS/MOOD NEUROLOGICAL BOWEL NUTRITION STATUS     Continent  (Please see DC summary)  AMBULATORY STATUS COMMUNICATION OF NEEDS Skin   Extensive Assist Verbally PU Stage and Appropriate Care (PU Stage II on sacrum)                       Personal Care Assistance Level of Assistance  Bathing, Feeding, Dressing Bathing Assistance: Limited assistance Feeding assistance: Independent Dressing Assistance: Limited assistance     Functional Limitations Info  Hearing   Hearing Info: Impaired      SPECIAL CARE FACTORS FREQUENCY  PT (By licensed PT)     PT Frequency: min 3x/week              Contractures      Additional Factors Info  Code Status, Allergies, Insulin Sliding Scale Code Status Info: DNR Allergies Info: Yellow Dyes (Non-tartrazine), Codeine, Other, Penicillins, Procaine   Insulin Sliding Scale Info: insulin aspart (novoLOG) injection 0-9 Units;       Current Medications (07/04/2015):  This is the current hospital active medication list Current Facility-Administered Medications  Medication Dose Route Frequency Provider Last Rate Last Dose  . acetaminophen (TYLENOL) tablet 650 mg  650 mg Oral Q6H PRN Russella Dar, NP       Or  . acetaminophen (TYLENOL) suppository 650 mg  650 mg Rectal Q6H PRN Russella Dar, NP      . albuterol (PROVENTIL) (2.5 MG/3ML) 0.083% nebulizer solution 2.5 mg  2.5 mg Inhalation Q6H PRN Russella Dar, NP   2.5 mg at 07/01/15 0859  . antiseptic oral rinse (CPC / CETYLPYRIDINIUM CHLORIDE 0.05%) solution 7 mL  7 mL Mouth Rinse q12n4p Mutaz  Elmahi, MD   7 mL at 07/03/15 1115  . aspirin EC tablet 81 mg  81 mg Oral Q6H PRN Russella Dar, NP      . atorvastatin (LIPITOR) tablet 20 mg  20 mg Oral QHS Russella Dar, NP   20 mg at 07/03/15 2109  . chlorhexidine (PERIDEX) 0.12 % solution 15 mL  15 mL Mouth Rinse BID Clydia Llano, MD   15 mL at 07/03/15 2109  . cholecalciferol (VITAMIN D) tablet 1,000 Units  1,000 Units Oral q morning - 10a Russella Dar, NP   1,000 Units at 07/03/15  0944  . escitalopram (LEXAPRO) tablet 10 mg  10 mg Oral Daily Russella Dar, NP   10 mg at 07/03/15 0945  . fenofibrate tablet 160 mg  160 mg Oral Daily Russella Dar, NP   160 mg at 07/03/15 0947  . fluconazole (DIFLUCAN) tablet 100 mg  100 mg Oral Daily Clydia Llano, MD   100 mg at 07/03/15 1033  . gabapentin (NEURONTIN) capsule 100 mg  100 mg Oral Daily Russella Dar, NP   100 mg at 07/03/15 0944  . hydrALAZINE (APRESOLINE) tablet 50 mg  50 mg Oral Q12H Russella Dar, NP   50 mg at 07/03/15 2109  . insulin aspart (novoLOG) injection 0-9 Units  0-9 Units Subcutaneous 6 times per day Russella Dar, NP   2 Units at 07/04/15 0604  . isosorbide mononitrate (IMDUR) 24 hr tablet 30 mg  30 mg Oral Daily Russella Dar, NP   30 mg at 07/03/15 0946  . levofloxacin (LEVAQUIN) tablet 500 mg  500 mg Oral Q48H Marquita Palms, [facility]      . methimazole (TAPAZOLE) tablet 5 mg  5 mg Oral Daily Russella Dar, NP   5 mg at 07/03/15 1045  . metoprolol tartrate (LOPRESSOR) tablet 25 mg  25 mg Oral BID Russella Dar, NP   25 mg at 07/03/15 2109  . multivitamin with minerals tablet 1 tablet  1 tablet Oral Daily Russella Dar, NP   1 tablet at 07/03/15 [id number]  . ondansetron (ZOFRAN) tablet 4 mg  4 mg Oral Q6H PRN Russella Dar, NP       Or  . ondansetron [facility]) injection 4 mg  4 mg Intravenous Q6H PRN Russella Dar, NP      . potassium chloride SA (K-DUR,KLOR-CON) CR tablet 40 mEq  40 mEq Oral Daily Clydia Llano, MD   40 mEq at 07/03/15 1112  . torsemide (DEMADEX) tablet 20 mg  20 mg Oral Daily Clydia Llano, MD   20 mg at 07/03/15 1709  . torsemide (DEMADEX) tablet 40 mg  40 mg Oral Daily Clydia Llano, MD   40 mg at 07/03/15 1301  . warfarin (COUMADIN) tablet 0.5 mg  0.5 mg Oral ONCE-1800 Almon Hercules, [facility]      . Warfarin - Pharmacist Dosing Inpatient   Does not apply q1800 Scarlett Presto, [facility]   Stopped at 07/02/15 1800     Discharge Medications: Please see discharge summary for a list of  discharge medications.  Relevant Imaging Results:  Relevant Lab Results:   Additional Information SSN: [id number] [place], [place]

## 2015-07-05 DIAGNOSIS — E11649 Type 2 diabetes mellitus with hypoglycemia without coma: Principal | ICD-10-CM

## 2015-07-05 LAB — BASIC METABOLIC PANEL
Anion gap: 11 (ref 5–15)
BUN: 47 mg/dL — ABNORMAL HIGH (ref 6–20)
CHLORIDE: 98 mmol/L — AB (ref 101–111)
CO2: 32 mmol/L (ref 22–32)
CREATININE: 2.35 mg/dL — AB (ref 0.44–1.00)
Calcium: 9.3 mg/dL (ref 8.9–10.3)
GFR calc non Af Amer: 18 mL/min — ABNORMAL LOW (ref >60)
GFR, EST AFRICAN AMERICAN: 21 mL/min — AB (ref >60)
Glucose, Bld: 159 mg/dL — ABNORMAL HIGH (ref 65–99)
POTASSIUM: 4.2 mmol/L (ref 3.5–5.1)
SODIUM: 141 mmol/L (ref 135–145)

## 2015-07-05 LAB — GLUCOSE, CAPILLARY
GLUCOSE-CAPILLARY: 145 mg/dL — AB (ref 65–99)
GLUCOSE-CAPILLARY: 189 mg/dL — AB (ref 65–99)
GLUCOSE-CAPILLARY: 88 mg/dL (ref 65–99)
Glucose-Capillary: 138 mg/dL — ABNORMAL HIGH (ref 65–99)

## 2015-07-05 LAB — MAGNESIUM: MAGNESIUM: 2.7 mg/dL — AB (ref 1.7–2.4)

## 2015-07-05 LAB — PROTIME-INR
INR: 2.94 — ABNORMAL HIGH (ref 0.00–1.49)
PROTHROMBIN TIME: 30.1 s — AB (ref 11.6–15.2)

## 2015-07-05 MED ORDER — WARFARIN 0.5 MG HALF TABLET
0.5000 mg | ORAL_TABLET | Freq: Once | ORAL | Status: DC
  Filled 2015-07-05: qty 1

## 2015-07-05 NOTE — Progress Notes (Signed)
NURSING PROGRESS NOTE  Alexis [name] [id number] Discharge Data: 07/05/2015 11:45 AM Attending Provider: Zannie Cove, MD SVX:[id number], FNP     [name] to be D/C'd to [name] per MD order.  Discussed with the patient and the patient's daughter the After Visit Summary and all questions fully answered. All IV's discontinued with no bleeding noted. All belongings returned to patient for patient to take home.   Last Vital Signs:  Blood pressure 162/58, pulse 71, temperature 98.2 F (36.8 C), temperature source Oral, resp. rate 16, height 5\' 2"  (1.575 m), weight 62.4 kg (137 lb 9.1 oz), SpO2 100 %.  Discharge Medication List   Medication List    STOP taking these medications        cephALEXin 500 MG capsule  Commonly known as:  KEFLEX     glipiZIDE 5 MG 24 hr tablet  Commonly known as:  GLUCOTROL XL      TAKE these medications        albuterol 108 (90 Base) MCG/ACT inhaler  Commonly known as:  PROVENTIL HFA;VENTOLIN HFA  Inhale 2 puffs into the lungs every 6 (six) hours as needed for wheezing or shortness of breath.     aspirin EC 81 MG tablet  Take 81 mg by mouth every 6 (six) hours as needed for moderate pain.     cholecalciferol 1000 units tablet  Commonly known as:  VITAMIN D  Take 1,000 Units by mouth every morning.     escitalopram 10 MG tablet  Commonly known as:  LEXAPRO  Take 10 mg by mouth daily.     fenofibrate 160 MG tablet  Take 160 mg by mouth daily.     gabapentin 100 MG capsule  Commonly known as:  NEURONTIN  Take 1 capsule (100 mg total) by mouth daily.     hydrALAZINE 50 MG tablet  Commonly known as:  APRESOLINE  Take 1 tablet (50 mg total) by mouth every 12 (twelve) hours.     isosorbide mononitrate 30 MG 24 hr tablet  Commonly known as:  IMDUR  TAKE 1 TABLET BY MOUTH EVERY DAY     LIPITOR 20 MG tablet  Generic drug:  atorvastatin  Take 20 mg by mouth at bedtime.     methimazole 5 MG tablet   Commonly known as:  TAPAZOLE  Take 1 tablet (5 mg total) by mouth 3 (three) times a week.     metoprolol tartrate 25 MG tablet  Commonly known as:  LOPRESSOR  TAKE 1 TABLET BY MOUTH TWICE A DAY     multivitamin with minerals Tabs tablet  Take 1 tablet by mouth daily.     OXYGEN  Inhale 3 L into the lungs at bedtime.     potassium chloride SA 20 MEQ tablet  Commonly known as:  K-DUR,KLOR-CON  Take 1 tablet (20 mEq total) by mouth daily.     torsemide 20 MG tablet  Commonly known as:  DEMADEX  40 mg in a.m. and 20 mg and p.m.     warfarin 2.5 MG tablet  Commonly known as:  COUMADIN  Take 1 tablet (2.5 mg total) by mouth daily at 6 PM.         Roma Kayser, RN

## 2015-07-05 NOTE — Progress Notes (Signed)
ANTICOAGULATION CONSULT NOTE  Pharmacy Consult for Warfarin Indication: atrial fibrillation  Allergies  Allergen Reactions  . Yellow Dyes (Non-Tartrazine) Nausea And Vomiting    "deathly allergic" No other information given to explain reaction.  . Codeine Hives  . Other Other (See Comments)    novocaine broke mouth out  . Penicillins Hives  . Procaine Hives    Patient Measurements: Height: 5\' 2"  (157.5 cm) Weight: 137 lb 9.1 oz (62.4 kg) IBW/kg (Calculated) : 50.1  Vital Signs: Temp: 98.2 F (36.8 C) (02/08 0625) Temp Source: Oral (02/08 0625) BP: 162/58 mmHg (02/08 0625) Pulse Rate: 71 (02/08 0625)  Labs:  Recent Labs  07/03/15 0208 07/04/15 0715 07/05/15 0645  HGB 7.9* 9.0*  --   HCT 25.3* 29.9*  --   PLT 187 251  --   LABPROT 33.7* 29.9* 30.1*  INR 3.41* 2.91* 2.94*  CREATININE 2.40* 2.19* 2.35*    Estimated Creatinine Clearance: 16.3 mL/min (by C-G formula based on Cr of 2.35).   Medical History: Past Medical History  Diagnosis Date  . Hypertension   . Neuropathy (HCC)   . Anxiety   . PONV (postoperative nausea and vomiting)   . CHF (congestive heart failure) (HCC)   . COPD (chronic obstructive pulmonary disease) (HCC)     on home, nocturnal oxygen.   . Family history of adverse reaction to anesthesia   . Cardiomyopathy (HCC)   . Diabetes mellitus without complication (HCC)     TYPE 2  . Macular degeneration, bilateral   . Varicose veins   . GIB (gastrointestinal bleeding)   . Paroxysmal atrial fibrillation (HCC)   . Shortness of breath dyspnea   . Acute on chronic diastolic heart failure (HCC) 12/22/2014   Assessment: 82 yr old female admitted with hypoglycemia. To continue warfarin for atrial fibrillation (last dose 2/2 pta). DDI: Levaquin and Fluconazole may increase INR.   Warfarin held 2/4-2/5 with supratherapeutic INR with large jump (likely due to DDI), now back in therapuetic range 2.91>>2.94 after small dose last night. Hg 9 (up), plt  wnl. No s/sx bleeding noted. Will give another small dose tonight to prevent INR from dropping too low.  Goal of Therapy:  INR 2-3 Monitor platelets by anticoagulation protocol: Yes   Plan:  Warfarin 0.5mg  x 1 dose tonight Daily PT/INR Monitor CBC, s/sx of bleeding  Babs Bertin, PharmD, [facility] Clinical Pharmacist Pager [phone] 07/05/2015 8:57 AM

## 2015-07-05 NOTE — Progress Notes (Signed)
Patient's daughter now considering SNF placement at [facility]. CSW to continue to follow.  Osborne Casco Kyriakos Babler LCSWA [phone]

## 2015-07-05 NOTE — Care Management Note (Signed)
Case Management Note  Patient Details  Name: AFTYN HEADLEE MRN: [id number] Date of Birth: 05-13-1932  Subjective/Objective:                 Patient admitted with hypoglycemia.    Action/Plan:  Will DC to SNF.   Expected Discharge Date:                  Expected Discharge Plan:  Skilled Nursing Facility  In-House Referral:  Clinical Social Work  Discharge planning Services  CM Consult  Post Acute Care Choice:    Choice offered to:     DME Arranged:    DME Agency:     HH Arranged:    HH Agency:     Status of Service:  Completed, signed off  Medicare Important Message Given:  Yes Date Medicare IM Given:    Medicare IM give by:    Date Additional Medicare IM Given:    Additional Medicare Important Message give by:     If discussed at Long Length of Stay Meetings, dates discussed:    Additional Comments:  Lawerance Sabal, RN 07/05/2015, 11:46 AM

## 2015-07-05 NOTE — Progress Notes (Signed)
Pt seen and examined, No changes from DC summary by [facility] 2/7 Stable for discharge, needs Bmet monitored at SNF within 1 week  Zannie Cove, MD

## 2015-07-05 NOTE — Progress Notes (Signed)
Patient will DC to: [facility] Anticipated DC date: 07/05/15 Family notified: Daughter, Criselda Peaches by: Donnamarie Poag will transport by car  CSW signing off.  Cristobal Goldmann, [place] Clinical Social Worker [phone]

## 2015-07-28 ENCOUNTER — Telehealth: Payer: Self-pay | Admitting: Internal Medicine

## 2015-07-28 NOTE — Telephone Encounter (Signed)
Received message from [place] at [place] that [facility] Nurse [name] needs an order from Dr. [name] for a weekly PT/INR Alycia Rossetti from  [facility] and she didn't know patient was out of hospital She will follow up at this point with her [facility] office

## 2015-08-02 ENCOUNTER — Inpatient Hospital Stay (HOSPITAL_COMMUNITY)
Admission: EM | Admit: 2015-08-02 | Discharge: 2015-08-05 | DRG: 304 | Disposition: A | Discharge status: Home Health | Payer: Medicare Other | Attending: Internal Medicine | Admitting: Internal Medicine

## 2015-08-02 ENCOUNTER — Emergency Department (HOSPITAL_COMMUNITY): Payer: Medicare Other

## 2015-08-02 ENCOUNTER — Encounter (HOSPITAL_COMMUNITY): Payer: Self-pay

## 2015-08-02 DIAGNOSIS — Z7951 Long term (current) use of inhaled steroids: Secondary | ICD-10-CM | POA: Diagnosis not present

## 2015-08-02 DIAGNOSIS — J449 Chronic obstructive pulmonary disease, unspecified: Secondary | ICD-10-CM | POA: Diagnosis present

## 2015-08-02 DIAGNOSIS — I5033 Acute on chronic diastolic (congestive) heart failure: Secondary | ICD-10-CM | POA: Diagnosis present

## 2015-08-02 DIAGNOSIS — Z888 Allergy status to other drugs, medicaments and biological substances status: Secondary | ICD-10-CM

## 2015-08-02 DIAGNOSIS — F419 Anxiety disorder, unspecified: Secondary | ICD-10-CM | POA: Diagnosis present

## 2015-08-02 DIAGNOSIS — I480 Paroxysmal atrial fibrillation: Secondary | ICD-10-CM | POA: Diagnosis present

## 2015-08-02 DIAGNOSIS — E11649 Type 2 diabetes mellitus with hypoglycemia without coma: Secondary | ICD-10-CM | POA: Diagnosis present

## 2015-08-02 DIAGNOSIS — Z7982 Long term (current) use of aspirin: Secondary | ICD-10-CM

## 2015-08-02 DIAGNOSIS — J9601 Acute respiratory failure with hypoxia: Secondary | ICD-10-CM | POA: Diagnosis not present

## 2015-08-02 DIAGNOSIS — E0590 Thyrotoxicosis, unspecified without thyrotoxic crisis or storm: Secondary | ICD-10-CM | POA: Diagnosis present

## 2015-08-02 DIAGNOSIS — F329 Major depressive disorder, single episode, unspecified: Secondary | ICD-10-CM | POA: Diagnosis present

## 2015-08-02 DIAGNOSIS — Z800 Family history of malignant neoplasm of digestive organs: Secondary | ICD-10-CM | POA: Diagnosis not present

## 2015-08-02 DIAGNOSIS — I160 Hypertensive urgency: Principal | ICD-10-CM | POA: Insufficient documentation

## 2015-08-02 DIAGNOSIS — Z79899 Other long term (current) drug therapy: Secondary | ICD-10-CM | POA: Diagnosis not present

## 2015-08-02 DIAGNOSIS — I5031 Acute diastolic (congestive) heart failure: Secondary | ICD-10-CM | POA: Diagnosis present

## 2015-08-02 DIAGNOSIS — H3530 Unspecified macular degeneration: Secondary | ICD-10-CM | POA: Diagnosis present

## 2015-08-02 DIAGNOSIS — Z794 Long term (current) use of insulin: Secondary | ICD-10-CM | POA: Diagnosis not present

## 2015-08-02 DIAGNOSIS — J9600 Acute respiratory failure, unspecified whether with hypoxia or hypercapnia: Secondary | ICD-10-CM | POA: Diagnosis present

## 2015-08-02 DIAGNOSIS — I10 Essential (primary) hypertension: Secondary | ICD-10-CM | POA: Diagnosis not present

## 2015-08-02 DIAGNOSIS — E1122 Type 2 diabetes mellitus with diabetic chronic kidney disease: Secondary | ICD-10-CM | POA: Diagnosis present

## 2015-08-02 DIAGNOSIS — E1140 Type 2 diabetes mellitus with diabetic neuropathy, unspecified: Secondary | ICD-10-CM | POA: Diagnosis present

## 2015-08-02 DIAGNOSIS — Z803 Family history of malignant neoplasm of breast: Secondary | ICD-10-CM | POA: Diagnosis not present

## 2015-08-02 DIAGNOSIS — Z7901 Long term (current) use of anticoagulants: Secondary | ICD-10-CM

## 2015-08-02 DIAGNOSIS — E785 Hyperlipidemia, unspecified: Secondary | ICD-10-CM | POA: Diagnosis present

## 2015-08-02 DIAGNOSIS — Z880 Allergy status to penicillin: Secondary | ICD-10-CM

## 2015-08-02 DIAGNOSIS — Z66 Do not resuscitate: Secondary | ICD-10-CM | POA: Diagnosis present

## 2015-08-02 DIAGNOSIS — N179 Acute kidney failure, unspecified: Secondary | ICD-10-CM | POA: Diagnosis present

## 2015-08-02 DIAGNOSIS — J42 Unspecified chronic bronchitis: Secondary | ICD-10-CM | POA: Diagnosis not present

## 2015-08-02 DIAGNOSIS — Z885 Allergy status to narcotic agent status: Secondary | ICD-10-CM

## 2015-08-02 DIAGNOSIS — I429 Cardiomyopathy, unspecified: Secondary | ICD-10-CM | POA: Diagnosis present

## 2015-08-02 DIAGNOSIS — N184 Chronic kidney disease, stage 4 (severe): Secondary | ICD-10-CM | POA: Diagnosis present

## 2015-08-02 DIAGNOSIS — I509 Heart failure, unspecified: Secondary | ICD-10-CM | POA: Insufficient documentation

## 2015-08-02 DIAGNOSIS — I130 Hypertensive heart and chronic kidney disease with heart failure and stage 1 through stage 4 chronic kidney disease, or unspecified chronic kidney disease: Secondary | ICD-10-CM | POA: Diagnosis present

## 2015-08-02 HISTORY — DX: Other dissociative and conversion disorders: F44.89

## 2015-08-02 LAB — CBC WITH DIFFERENTIAL/PLATELET
BASOS PCT: 1 %
Basophils Absolute: 0 10*3/uL (ref 0.0–0.1)
EOS ABS: 0.2 10*3/uL (ref 0.0–0.7)
EOS PCT: 2 %
HCT: 29.9 % — ABNORMAL LOW (ref 36.0–46.0)
Hemoglobin: 9.2 g/dL — ABNORMAL LOW (ref 12.0–15.0)
LYMPHS ABS: 1.1 10*3/uL (ref 0.7–4.0)
Lymphocytes Relative: 12 %
MCH: 25.6 pg — AB (ref 26.0–34.0)
MCHC: 30.8 g/dL (ref 30.0–36.0)
MCV: 83.1 fL (ref 78.0–100.0)
Monocytes Absolute: 0.4 10*3/uL (ref 0.1–1.0)
Monocytes Relative: 5 %
Neutro Abs: 6.9 10*3/uL (ref 1.7–7.7)
Neutrophils Relative %: 80 %
PLATELETS: 317 10*3/uL (ref 150–400)
RBC: 3.6 MIL/uL — AB (ref 3.87–5.11)
RDW: 17 % — ABNORMAL HIGH (ref 11.5–15.5)
WBC: 8.6 10*3/uL (ref 4.0–10.5)

## 2015-08-02 LAB — I-STAT ARTERIAL BLOOD GAS, ED
Acid-Base Excess: 1 mmol/L (ref 0.0–2.0)
Bicarbonate: 26.2 mEq/L — ABNORMAL HIGH (ref 20.0–24.0)
O2 SAT: 94 %
TCO2: 27 mmol/L (ref 0–100)
pCO2 arterial: 41 mmHg (ref 35.0–45.0)
pH, Arterial: 7.413 (ref 7.350–7.450)
pO2, Arterial: 72 mmHg — ABNORMAL LOW (ref 80.0–100.0)

## 2015-08-02 LAB — BASIC METABOLIC PANEL
Anion gap: 14 (ref 5–15)
BUN: 48 mg/dL — AB (ref 6–20)
CO2: 26 mmol/L (ref 22–32)
CREATININE: 2.22 mg/dL — AB (ref 0.44–1.00)
Calcium: 9.7 mg/dL (ref 8.9–10.3)
Chloride: 105 mmol/L (ref 101–111)
GFR calc Af Amer: 23 mL/min — ABNORMAL LOW (ref >60)
GFR, EST NON AFRICAN AMERICAN: 20 mL/min — AB (ref >60)
Glucose, Bld: 199 mg/dL — ABNORMAL HIGH (ref 65–99)
POTASSIUM: 3.7 mmol/L (ref 3.5–5.1)
SODIUM: 145 mmol/L (ref 135–145)

## 2015-08-02 LAB — TROPONIN I
TROPONIN I: 0.05 ng/mL — AB (ref <0.031)
TROPONIN I: 0.05 ng/mL — AB (ref <0.031)

## 2015-08-02 LAB — I-STAT TROPONIN, ED: Troponin i, poc: 0.02 ng/mL (ref 0.00–0.08)

## 2015-08-02 LAB — PROCALCITONIN

## 2015-08-02 LAB — PROTIME-INR
INR: 2.52 — ABNORMAL HIGH (ref 0.00–1.49)
PROTHROMBIN TIME: 26.9 s — AB (ref 11.6–15.2)

## 2015-08-02 LAB — GLUCOSE, CAPILLARY: GLUCOSE-CAPILLARY: 319 mg/dL — AB (ref 65–99)

## 2015-08-02 LAB — BRAIN NATRIURETIC PEPTIDE: B NATRIURETIC PEPTIDE 5: 1083.4 pg/mL — AB (ref 0.0–100.0)

## 2015-08-02 MED ORDER — HYDRALAZINE HCL 20 MG/ML IJ SOLN
10.0000 mg | Freq: Once | INTRAMUSCULAR | Status: AC
  Administered 2015-08-02: 10 mg via INTRAVENOUS
  Filled 2015-08-02: qty 1

## 2015-08-02 MED ORDER — ISOSORBIDE MONONITRATE ER 30 MG PO TB24
30.0000 mg | ORAL_TABLET | Freq: Every day | ORAL | Status: DC
Start: 2015-08-03 — End: 2015-08-05
  Administered 2015-08-03 – 2015-08-05 (×3): 30 mg via ORAL
  Filled 2015-08-02 (×3): qty 1

## 2015-08-02 MED ORDER — METOPROLOL TARTRATE 25 MG PO TABS
25.0000 mg | ORAL_TABLET | Freq: Two times a day (BID) | ORAL | Status: DC
  Administered 2015-08-02 – 2015-08-05 (×6): 25 mg via ORAL
  Filled 2015-08-02 (×7): qty 1

## 2015-08-02 MED ORDER — ASPIRIN EC 81 MG PO TBEC: 81.0000 mg | DELAYED_RELEASE_TABLET | Freq: Four times a day (QID) | ORAL | Status: DC | PRN

## 2015-08-02 MED ORDER — WARFARIN SODIUM 2.5 MG PO TABS
2.5000 mg | ORAL_TABLET | Freq: Once | ORAL | Status: AC
  Administered 2015-08-02: 2.5 mg via ORAL
  Filled 2015-08-02: qty 1

## 2015-08-02 MED ORDER — GABAPENTIN 100 MG PO CAPS
100.0000 mg | ORAL_CAPSULE | Freq: Every day | ORAL | Status: DC
  Administered 2015-08-02 – 2015-08-05 (×4): 100 mg via ORAL
  Filled 2015-08-02 (×4): qty 1

## 2015-08-02 MED ORDER — ATORVASTATIN CALCIUM 20 MG PO TABS
20.0000 mg | ORAL_TABLET | Freq: Every day | ORAL | Status: DC
  Administered 2015-08-02 – 2015-08-04 (×3): 20 mg via ORAL
  Filled 2015-08-02 (×3): qty 1

## 2015-08-02 MED ORDER — FUROSEMIDE 10 MG/ML IJ SOLN
80.0000 mg | Freq: Two times a day (BID) | INTRAMUSCULAR | Status: DC
  Administered 2015-08-02 – 2015-08-03 (×3): 80 mg via INTRAVENOUS
  Filled 2015-08-02 (×3): qty 8

## 2015-08-02 MED ORDER — FUROSEMIDE 10 MG/ML IJ SOLN
80.0000 mg | Freq: Once | INTRAMUSCULAR | Status: AC
  Administered 2015-08-02: 80 mg via INTRAVENOUS
  Filled 2015-08-02: qty 8

## 2015-08-02 MED ORDER — ESCITALOPRAM OXALATE 10 MG PO TABS
10.0000 mg | ORAL_TABLET | Freq: Every day | ORAL | Status: DC
  Administered 2015-08-02 – 2015-08-05 (×4): 10 mg via ORAL
  Filled 2015-08-02 (×4): qty 1

## 2015-08-02 MED ORDER — POTASSIUM CHLORIDE CRYS ER 20 MEQ PO TBCR
20.0000 meq | EXTENDED_RELEASE_TABLET | Freq: Every day | ORAL | Status: DC
  Administered 2015-08-02 – 2015-08-05 (×4): 20 meq via ORAL
  Filled 2015-08-02 (×4): qty 1

## 2015-08-02 MED ORDER — PROMETHAZINE HCL 25 MG PO TABS
12.5000 mg | ORAL_TABLET | Freq: Four times a day (QID) | ORAL | Status: DC | PRN
  Administered 2015-08-05: 12.5 mg via ORAL
  Filled 2015-08-02: qty 1

## 2015-08-02 MED ORDER — FENOFIBRATE 160 MG PO TABS
160.0000 mg | ORAL_TABLET | Freq: Every day | ORAL | Status: DC
  Administered 2015-08-03 – 2015-08-05 (×3): 160 mg via ORAL
  Filled 2015-08-02 (×3): qty 1

## 2015-08-02 MED ORDER — HYDRALAZINE HCL 50 MG PO TABS
50.0000 mg | ORAL_TABLET | Freq: Two times a day (BID) | ORAL | Status: DC
  Administered 2015-08-02 – 2015-08-05 (×6): 50 mg via ORAL
  Filled 2015-08-02: qty 1
  Filled 2015-08-02: qty 2
  Filled 2015-08-02: qty 1
  Filled 2015-08-02: qty 2
  Filled 2015-08-02 (×4): qty 1

## 2015-08-02 MED ORDER — SODIUM CHLORIDE 0.9% FLUSH
3.0000 mL | Freq: Two times a day (BID) | INTRAVENOUS | Status: DC
  Administered 2015-08-02 – 2015-08-05 (×6): 3 mL via INTRAVENOUS

## 2015-08-02 MED ORDER — WARFARIN - PHARMACIST DOSING INPATIENT
Freq: Every day | Status: DC
  Administered 2015-08-03: 18:00:00

## 2015-08-02 MED ORDER — METHIMAZOLE 5 MG PO TABS
5.0000 mg | ORAL_TABLET | Freq: Every day | ORAL | Status: DC
  Administered 2015-08-03 – 2015-08-05 (×3): 5 mg via ORAL
  Filled 2015-08-02 (×6): qty 1

## 2015-08-02 MED ORDER — NITROGLYCERIN IN D5W 200-5 MCG/ML-% IV SOLN
5.0000 ug/min | Freq: Once | INTRAVENOUS | Status: AC
  Administered 2015-08-02: 5 ug/min via INTRAVENOUS
  Filled 2015-08-02: qty 250

## 2015-08-02 NOTE — H&P (Addendum)
[facility] History and Physical  Alexis [name] ZOX:[id number] DOB: 1931-06-12 DOA: 08/02/2015  Referring physician: Dr Fayrene Fearing - [facility] PCP: Elizabeth Palau, FNP   Chief Complaint: SOB  HPI: Alexis [name] is a 82 y.o. female  Level V caveat: Patient very elderly and extremely poor historian. Question if there is undiagnosed underlying dementia contributing to patient's state. Currently patient is at her normal mental state per patient's daughter and son. History provided in part by EDP as well as patient's children and the patient. Of note patient's family cares for her and provides her with her medications. Patient has no idea what medicine she is on.  Shortness of breath. Acute onset. Started this morning when patient awoke from sleeping overnight. Associate with feelings of dizziness and weakness and intermittent chest pain. Patient states that she is normally on 2 L nasal cannula. Over the past week patient has increased her home O2 to 3 L due to chronic shortness of breath and weakness.    Of note patient left [facility] on 07/05/2015 after admission for acute on chronic diastolic heart failure, respiratory failure, and hypoglycemia.   Review of Systems:  Unable to obtain further review systems due to patient's mental status.  Past Medical History  Diagnosis Date  . Hypertension   . Neuropathy (HCC)   . Anxiety   . PONV (postoperative nausea and vomiting)   . CHF (congestive heart failure) (HCC)   . COPD (chronic obstructive pulmonary disease) (HCC)     on home, nocturnal oxygen.   . Family history of adverse reaction to anesthesia   . Cardiomyopathy (HCC)   . Diabetes mellitus without complication (HCC)     TYPE 2  . Macular degeneration, bilateral   . Varicose veins   . GIB (gastrointestinal bleeding)   . Paroxysmal atrial fibrillation (HCC)   . Shortness of breath dyspnea   . Acute on chronic diastolic heart failure (HCC) 12/22/2014  .  Confusion state    Past Surgical History  Procedure Laterality Date  . Hip fracture surgery  2004  . Abdominal hysterectomy    . Vein surgery    . Esophagogastroduodenoscopy N/A 12/01/2014    Procedure: ESOPHAGOGASTRODUODENOSCOPY (EGD);  Surgeon: Hilarie Fredrickson, MD;  Location: [facility] ENDOSCOPY;  Service: Endoscopy;  Laterality: N/A;   Social History:  reports that she has been passively smoking.  She has never used smokeless tobacco. She reports that she does not drink alcohol or use illicit drugs.  Allergies  Allergen Reactions  . Yellow Dyes (Non-Tartrazine) Nausea And Vomiting    "deathly allergic" No other information given to explain reaction.  . Codeine Hives  . Other Other (See Comments)    novocaine broke mouth out  . Penicillins Hives  . Procaine Hives    Family History  Problem Relation Age of Onset  . Colon cancer Father 58    advanced when discovered, no surgery or therapy.  this led to his death  . Breast cancer Sister   . Cancer Brother   . Breast cancer Mother   . Lung cancer Brother   . Thyroid disease Neg Hx      Prior to Admission medications   Medication Sig Start Date End Date Taking? Authorizing Provider  albuterol (PROVENTIL HFA;VENTOLIN HFA) 108 (90 BASE) MCG/ACT inhaler Inhale 2 puffs into the lungs every 6 (six) hours as needed for wheezing or shortness of breath. 06/11/13   Nishant Dhungel, MD  aspirin EC 81 MG tablet Take 81 mg by  mouth every 6 (six) hours as needed for moderate pain.    Historical Provider, MD  atorvastatin (LIPITOR) 20 MG tablet Take 20 mg by mouth at bedtime. 10/12/14 10/12/15  Historical Provider, MD  cholecalciferol (VITAMIN D) 1000 UNITS tablet Take 1,000 Units by mouth every morning.     Historical Provider, MD  escitalopram (LEXAPRO) 10 MG tablet Take 10 mg by mouth daily.    Historical Provider, MD  fenofibrate 160 MG tablet Take 160 mg by mouth daily.    Historical Provider, MD  gabapentin (NEURONTIN) 100 MG capsule Take 1 capsule  (100 mg total) by mouth daily. 06/15/15   Rhetta Mura, MD  hydrALAZINE (APRESOLINE) 50 MG tablet Take 1 tablet (50 mg total) by mouth every 12 (twelve) hours. 06/23/15   Meredeth Ide, MD  isosorbide mononitrate (IMDUR) 30 MG 24 hr tablet TAKE 1 TABLET BY MOUTH EVERY DAY 01/31/15   Chrystie Nose, MD  methimazole (TAPAZOLE) 5 MG tablet Take 1 tablet (5 mg total) by mouth 3 (three) times a week. Patient taking differently: Take 5 mg by mouth daily.  01/23/15   Romero Belling, MD  metoprolol tartrate (LOPRESSOR) 25 MG tablet TAKE 1 TABLET BY MOUTH TWICE A DAY 02/27/15   Chrystie Nose, MD  Multiple Vitamin (MULTIVITAMIN WITH MINERALS) TABS tablet Take 1 tablet by mouth daily.    Historical Provider, MD  OXYGEN Inhale 3 L into the lungs at bedtime.     Historical Provider, MD  potassium chloride SA (K-DUR,KLOR-CON) 20 MEQ tablet Take 1 tablet (20 mEq total) by mouth daily. 12/25/14   Lora Paula, MD  torsemide (DEMADEX) 20 MG tablet 40 mg in a.m. and 20 mg and p.m. 07/04/15   Clydia Llano, MD  warfarin (COUMADIN) 2.5 MG tablet Take 1 tablet (2.5 mg total) by mouth daily at 6 PM. 06/23/15   Meredeth Ide, MD   Physical Exam: Filed Vitals:   08/02/15 1215 08/02/15 1230 08/02/15 1245 08/02/15 1300  BP: 147/48 142/47 144/47 156/51  Pulse: 91 91 90 95  Temp:      TempSrc:      Resp: SpO2: 95% 94% 96% 92%    Wt Readings from Last 3 Encounters:  07/03/15 62.4 kg (137 lb 9.1 oz)  06/29/15 63.504 kg (140 lb)  06/20/15 64.2 kg (141 lb 8.6 oz)    General:  Appears calm and comfortable Eyes:  PERRL, EOMI, normal lids, iris ENT:  grossly normal hearing, lips & tongue Neck:  no LAD, masses or thyromegaly Cardiovascular:  RRR, no m/r/g. No LE edema.  Respiratory: Mild increased effort, decreased breath sounds in the bases bilaterally with associated crackles. Abdomen:  soft, ntnd Skin:  no rash or induration seen on limited exam Musculoskeletal:  grossly normal tone  BUE/BLE Psychiatric: Answers questions appropriately to the best of her knowledge. Somewhat flattened affect, pleasant,  Neurologic:  CN 2-12 grossly intact, moves all extremities in coordinated fashion.          Labs on Admission:  Basic Metabolic Panel:  Recent Labs Lab 08/02/15 1023  NA 145  K 3.7  CL 105  CO2 26  GLUCOSE 199*  BUN 48*  CREATININE 2.22*  CALCIUM 9.7   Liver Function Tests: No results for input(s): AST, ALT, ALKPHOS, BILITOT, PROT, ALBUMIN in the last 168 hours. No results for input(s): LIPASE, AMYLASE in the last 168 hours. No results for input(s): AMMONIA in the last 168 hours. CBC:  Recent  Labs Lab 08/02/15 1023  WBC 8.6  NEUTROABS 6.9  HGB 9.2*  HCT 29.9*  MCV 83.1  PLT 317   Cardiac Enzymes: No results for input(s): CKTOTAL, CKMB, CKMBINDEX, TROPONINI in the last 168 hours.  BNP (last 3 results)  Recent Labs  06/11/15 1755 06/20/15 1630 08/02/15 1023  BNP 914.1* 1716.4* 1083.4*    ProBNP (last 3 results) No results for input(s): PROBNP in the last 8760 hours.   Creatinine clearance cannot be calculated (Unknown ideal weight.)  CBG: No results for input(s): GLUCAP in the last 168 hours.  Radiological Exams on Admission: Dg Chest Portable 1 View  08/02/2015  CLINICAL DATA:  Shortness of breath. EXAM: PORTABLE CHEST 1 VIEW COMPARISON:  07/02/2015. FINDINGS: Mediastinum hilar structures normal. Cardiomegaly with progressive bilateral pulmonary infiltrates consistent pulmonary edema. Bilateral pneumonia could present this fashion. Small right pleural effusion. No pneumothorax. IMPRESSION: Cardiomegaly with progressive bilateral pulmonary infiltrates consistent with congestive heart failure with pulmonary edema. Bilateral pneumonia could also present and this fashion. Small right pleural effusion. Electronically Signed   By: Maisie Fus  Register   On: 08/02/2015 09:45     Assessment/Plan Principal Problem:   Acute respiratory failure  (HCC) Active Problems:   Hypertension   COPD (chronic obstructive pulmonary disease) (HCC)   Paroxysmal atrial fibrillation (HCC)   Hyperthyroidism   CKD (chronic kidney disease), stage IV (HCC)   Acute diastolic CHF (congestive heart failure) (HCC)   Acute respiratory failure, likely secondary to acute diastolic CHF. Decompensated. BNP 1083, new O2 requirement. Initially on BIPAP.  CXR w/ possible pneumonia but doubt at this time given nml WBC, no cough or fever. Pt states shes unsure of what she takes for chf as meds are given by family. Discussed case with RT, Renae Fickle, who states that upon arrival and placing patient on BiPAP there is no evidence of wheezing, rhonchi, or decreased air movement. This would favor CHF over COPD exacerbation or pneumonia. ABG drawn after patient came off BiPAP shows pH 7.4, PCO2 41, PO2 72, bicarbonate 26.2 - stepdown - ABG - Lasix 80 BID (home torsemide 40 QAM and 20 QHS) - CHF orderset - procalcitonin - PRN duonebs  Hypertensive urgency: presenting BP 204/93 w/ CP. Did not take home BP meds on day of admission. H/o MI, PAF. Started on nitro drip in the ED with rapid improvement in blood pressure. Currently chest pain-free. Troponin negative. EKG without signs of ACS and in sinus. - DC nitro - Continue coumadin - Tele - ASA - continue metop, imdur,  - Cycle trop  Hyperthyroid: - continue tapazole - TSH  CKD: cr 2.2. At baseline. Anticipate worsening function given diuresis - BMET in am  DM: last A1c 6.3 06/21/15 in preDM range - SSI  Neuropathy: - continue neurontin  HLD:  - continue statin, fenofibrate  Depression - continue lexapro   Code Status: DNR DVT Prophylaxis: COumadin Family Communication: daughter and son Disposition Plan: Pending Improvement    MERRELL, DAVID J, MD Family Medicine [facility] [email or web address] Password [email or web address]

## 2015-08-02 NOTE — Progress Notes (Signed)
ANTICOAGULATION CONSULT NOTE - Initial Consult  Pharmacy Consult for Coumadin Indication: atrial fibrillation  Allergies  Allergen Reactions  . Yellow Dyes (Non-Tartrazine) Nausea And Vomiting    "deathly allergic" No other information given to explain reaction. Per Daughter- this was an IV dye. Ok with yellow coloring on Coumadin tablets.   . Codeine Hives  . Other Other (See Comments)    novocaine broke mouth out  . Penicillins Hives  . Procaine Hives    Patient Measurements: Weight: 132 lb 7.9 oz (60.1 kg)  Vital Signs: Temp: 97.6 F (36.4 C) (03/08 1934) Temp Source: Oral (03/08 1934) BP: 119/29 mmHg (03/08 1934) Pulse Rate: 71 (03/08 1934)  Labs:  Recent Labs  08/02/15 1023 08/02/15 1817  HGB 9.2*  --   HCT 29.9*  --   PLT 317  --   LABPROT  --  26.9*  INR  --  2.52*  CREATININE 2.22*  --   TROPONINI  --  0.05*    Estimated Creatinine Clearance: 15.7 mL/min (by C-G formula based on Cr of 2.22).   Medical History: Past Medical History  Diagnosis Date  . Hypertension   . Neuropathy (HCC)   . Anxiety   . PONV (postoperative nausea and vomiting)   . CHF (congestive heart failure) (HCC)   . COPD (chronic obstructive pulmonary disease) (HCC)     on home, nocturnal oxygen.   . Family history of adverse reaction to anesthesia   . Cardiomyopathy (HCC)   . Diabetes mellitus without complication (HCC)     TYPE 2  . Macular degeneration, bilateral   . Varicose veins   . GIB (gastrointestinal bleeding)   . Paroxysmal atrial fibrillation (HCC)   . Shortness of breath dyspnea   . Acute on chronic diastolic heart failure (HCC) 12/22/2014  . Confusion state      Assessment: 71 yof admitted 08/02/2015  After syncopal episode. Pt on warf PTA for AFib.  PTA dose = 2.5mg  daily  INR on admit 2.52. Hgb 9.2, PLT 317.   Goal of Therapy:  INR 2-3 Monitor platelets by anticoagulation protocol: Yes   Plan:  Coumadin 2.5mg  x1 tonight Daily INR/CBC Monitor for  s/s of bleding  Mata Rowen C. Marvis Moeller, PharmD Pharmacy Resident  Pager: [phone] 08/02/2015 8:30 PM

## 2015-08-02 NOTE — Progress Notes (Signed)
Admission note:  Arrival Method: Pt arrived on stretcher from ED Mental Orientation: Alert and oriented x 4 Telemetry: Telemetry box 6e08 applied. CCMT notified. Assessment: Completed Skin: Stage two pressure sore noted to patient's left buttocks measuring 1cm x 1cm. Healing stage two pressure sore noted to patient's right buttocks. Pink foam dressing placed.  IV: Right forearm. Site clean, dry and intact.  Pain: Pt states no pain at this time Tubes: N/A Safety Measures: Bed in lowest position, non-slip socks placed, bed alarm activated, call light within reach Fall Prevention Safety Plan: Reviewed with patient and patient family Admission Screening: Completed 6700 Orientation: Patient has been oriented to the unit, staff and to the room. Orders have been reviewed and implemented. Call light within reach. Will continue to monitor the patient closely.   Feliciana Rossetti, RN, BSN

## 2015-08-02 NOTE — ED Notes (Signed)
Pt. Neb txt finished, pt. Placed on 4L [place] with sats at 70%, Pt. sats 73% on 6L. Pt. Placed on NRB, Dr. Fayrene Fearing notified.

## 2015-08-02 NOTE — ED Notes (Signed)
Nitro drip d/c per Konrad Dolores MD

## 2015-08-02 NOTE — ED Notes (Signed)
Pt given graham crackers and a diet coke per request.

## 2015-08-02 NOTE — ED Provider Notes (Addendum)
CSN: [id number]     Arrival date & time 08/02/15  0901 History   First MD Initiated Contact with Patient 08/02/15 [phone]     Chief Complaint  Patient presents with  . Shortness of Breath     HPI   Patient presents for evaluation of acute shortness of breath. Said she had a normal day yesterday. Became really short of breath this morning. She waking from sleep dizzy weak and shortness of breath. Normally on 2 and half liters at home. She states for the last weeks is been using 3 L. Given nebulized albuterol en route by paramedics.  Chest pain. Denies fever. Has not had leg edema. No change in her medications that she can recall. Ultimately states she "thinks" they may have decreased her Demadex, but she is uncertain when this may have happened. She admits that her daughter organizes most of her medications for her.  History of CHF with an EF of 60, but diastolic dysfunction. Also history of COPD. Uses albuterol at home. Is on diuretics at home. On multiple antihypertensives.  Recent echocardiogram shows EF of 60 and diastolic dysfunction.  Past Medical History  Diagnosis Date  . Hypertension   . Neuropathy (HCC)   . Anxiety   . PONV (postoperative nausea and vomiting)   . CHF (congestive heart failure) (HCC)   . COPD (chronic obstructive pulmonary disease) (HCC)     on home, nocturnal oxygen.   . Family history of adverse reaction to anesthesia   . Cardiomyopathy (HCC)   . Diabetes mellitus without complication (HCC)     TYPE 2  . Macular degeneration, bilateral   . Varicose veins   . GIB (gastrointestinal bleeding)   . Paroxysmal atrial fibrillation (HCC)   . Shortness of breath dyspnea   . Acute on chronic diastolic heart failure (HCC) 12/22/2014   Past Surgical History  Procedure Laterality Date  . Hip fracture surgery  2004  . Abdominal hysterectomy    . Vein surgery    . Esophagogastroduodenoscopy N/A 12/01/2014    Procedure: ESOPHAGOGASTRODUODENOSCOPY (EGD);  Surgeon: Hilarie Fredrickson, MD;  [facility] ENDOSCOPY;  Service: Endoscopy;  Laterality: N/A;   Family History  Problem Relation Age of Onset  . Colon cancer Father 62    advanced when discovered, no surgery or therapy.  this led to his death  . Breast cancer Sister   . Cancer Brother   . Breast cancer Mother   . Lung cancer Brother   . Thyroid disease Neg Hx    Social History  Substance Use Topics  . Smoking status: Passive Smoke Exposure - Never Smoker  . Smokeless tobacco: Never Used  . Alcohol Use: No   OB History    No data available     Review of Systems  Constitutional: Negative for fever, chills, diaphoresis, appetite change and fatigue.  HENT: Negative for mouth sores, sore throat and trouble swallowing.   Eyes: Negative for visual disturbance.  Respiratory: Positive for chest tightness, shortness of breath and wheezing. Negative for cough.   Cardiovascular: Negative for chest pain.  Gastrointestinal: Negative for nausea, vomiting, abdominal pain, diarrhea and abdominal distention.  Endocrine: Negative for polydipsia, polyphagia and polyuria.  Genitourinary: Negative for dysuria, frequency and hematuria.  Musculoskeletal: Negative for gait problem.  Skin: Negative for color change, pallor and rash.  Neurological: Positive for weakness and light-headedness. Negative for dizziness, syncope and headaches.  Hematological: Does not bruise/bleed easily.  Psychiatric/Behavioral: Negative for behavioral problems and confusion.  Allergies  Yellow dyes (non-tartrazine); Codeine; Other; Penicillins; and Procaine  Home Medications   Prior to Admission medications   Medication Sig Start Date End Date Taking? Authorizing Provider  albuterol (PROVENTIL HFA;VENTOLIN HFA) 108 (90 BASE) MCG/ACT inhaler Inhale 2 puffs into the lungs every 6 (six) hours as needed for wheezing or shortness of breath. 06/11/13   Nishant Dhungel, MD  aspirin EC 81 MG tablet Take 81 mg by mouth every 6 (six) hours  as needed for moderate pain.    Historical Provider, MD  atorvastatin (LIPITOR) 20 MG tablet Take 20 mg by mouth at bedtime. 10/12/14 10/12/15  Historical Provider, MD  cholecalciferol (VITAMIN D) 1000 UNITS tablet Take 1,000 Units by mouth every morning.     Historical Provider, MD  escitalopram (LEXAPRO) 10 MG tablet Take 10 mg by mouth daily.    Historical Provider, MD  fenofibrate 160 MG tablet Take 160 mg by mouth daily.    Historical Provider, MD  gabapentin (NEURONTIN) 100 MG capsule Take 1 capsule (100 mg total) by mouth daily. 06/15/15   Rhetta Mura, MD  hydrALAZINE (APRESOLINE) 50 MG tablet Take 1 tablet (50 mg total) by mouth every 12 (twelve) hours. 06/23/15   Meredeth Ide, MD  isosorbide mononitrate (IMDUR) 30 MG 24 hr tablet TAKE 1 TABLET BY MOUTH EVERY DAY 01/31/15   Chrystie Nose, MD  methimazole (TAPAZOLE) 5 MG tablet Take 1 tablet (5 mg total) by mouth 3 (three) times a week. Patient taking differently: Take 5 mg by mouth daily.  01/23/15   Romero Belling, MD  metoprolol tartrate (LOPRESSOR) 25 MG tablet TAKE 1 TABLET BY MOUTH TWICE A DAY 02/27/15   Chrystie Nose, MD  Multiple Vitamin (MULTIVITAMIN WITH MINERALS) TABS tablet Take 1 tablet by mouth daily.    Historical Provider, MD  OXYGEN Inhale 3 L into the lungs at bedtime.     Historical Provider, MD  potassium chloride SA (K-DUR,KLOR-CON) 20 MEQ tablet Take 1 tablet (20 mEq total) by mouth daily. 12/25/14   Lora Paula, MD  torsemide (DEMADEX) 20 MG tablet 40 mg in a.m. and 20 mg and p.m. 07/04/15   Clydia Llano, MD  warfarin (COUMADIN) 2.5 MG tablet Take 1 tablet (2.5 mg total) by mouth daily at 6 PM. 06/23/15   Meredeth Ide, MD   BP 147/48 mmHg  Pulse 91  Temp(Src) 97.9 F (36.6 C) (Axillary)  Resp 18  SpO2 95% Physical Exam  Constitutional: She is oriented to person, place, and time. She appears well-developed and well-nourished. She appears distressed.  HENT:  Head: Normocephalic.  Eyes: Conjunctivae are  normal. Pupils are equal, round, and reactive to light. No scleral icterus.  Neck: Normal range of motion. Neck supple. No thyromegaly present.  Cardiovascular: Regular rhythm.  Tachycardia present.  Exam reveals no gallop and no friction rub.   No murmur heard. Pulmonary/Chest: Effort normal and breath sounds normal. No respiratory distress. She has no wheezes. She has no rales.  Crackles to the mid scapula. Overall poor air exchange and respiratory distress. 2- 3 word dyspnea. No lower extremity edema  Abdominal: Soft. Bowel sounds are normal. She exhibits no distension. There is no tenderness. There is no rebound.  Musculoskeletal: Normal range of motion.  Neurological: She is alert and oriented to person, place, and time.  Skin: Skin is warm and dry. No rash noted.  Psychiatric: She has a normal mood and affect. Her behavior is normal.    ED Course  Procedures (  including critical care time) Labs Review Labs Reviewed  CBC WITH DIFFERENTIAL/PLATELET - Abnormal; Notable for the following:    RBC 3.60 (*)    Hemoglobin 9.2 (*)    HCT 29.9 (*)    MCH 25.6 (*)    RDW 17.0 (*)    All other components within normal limits  BASIC METABOLIC PANEL - Abnormal; Notable for the following:    Glucose, Bld 199 (*)    BUN 48 (*)    Creatinine, Ser 2.22 (*)    GFR calc non Af Amer 20 (*)    GFR calc Af Amer 23 (*)    All other components within normal limits  BRAIN NATRIURETIC PEPTIDE - Abnormal; Notable for the following:    B Natriuretic Peptide 1083.4 (*)    All other components within normal limits  BLOOD GAS, ARTERIAL  I-STAT TROPOININ, ED    Imaging Review Dg Chest Portable 1 View  08/02/2015  CLINICAL DATA:  Shortness of breath. EXAM: PORTABLE CHEST 1 VIEW COMPARISON:  07/02/2015. FINDINGS: Mediastinum hilar structures normal. Cardiomegaly with progressive bilateral pulmonary infiltrates consistent pulmonary edema. Bilateral pneumonia could present this fashion. Small right pleural  effusion. No pneumothorax. IMPRESSION: Cardiomegaly with progressive bilateral pulmonary infiltrates consistent with congestive heart failure with pulmonary edema. Bilateral pneumonia could also present and this fashion. Small right pleural effusion. Electronically Signed   By: Maisie Fus  Register   On: 08/02/2015 09:45   I have personally reviewed and evaluated these images and lab results as part of my medical decision-making.   EKG Interpretation   Date/Time:  Wednesday August 02 2015 09:12:23 EST Ventricular Rate:  94 PR Interval:  155 QRS Duration: 98 QT Interval:  407 QTC Calculation: 509 R Axis:   28 Text Interpretation:  Sinus rhythm Nonspecific repol abnormality, diffuse  leads Minimal ST elevation, lateral leads Prolonged QT interval Baseline  wander in lead(s) V2 Confirmed by Fayrene Fearing  MD, Sherrise Liberto ([id number]) on 08/02/2015  9:35:34 AM Also confirmed by Fayrene Fearing  MD, Cherolyn Behrle ([id number]), editor Dan Humphreys, CCT,  SANDRA ([id number])  on 08/02/2015 10:08:42 AM      MDM   Final diagnoses:  Congestive heart failure, unspecified congestive heart failure chronicity, unspecified congestive heart failure type (HCC)    Upon initial evaluation, impression is acute congestive heart failure hypertensive urgency. Started on nitroglycerin IV. Placed on BiPAP. Given Lasix 80 IV. On recheck blood pressures have improved to 140 systolic. Given hydralazine 10 mg IV. We'll attempt to wean nitroglycerin. His only diuresis to 50. Additional IV Lasix 80 mg. We'll trial her off of BiPAP.  On recheck patient tolerating nasal cannula 5 L. Care discussed with Dr. Kirby Funk, of [facility] hospitalist. Stepdown bed is been requested. Patient much improved.  CRITICAL CARE Performed by: Rolland Porter JOSEPH   Total critical care time: 90 minutes  Critical care time was exclusive of separately billable procedures and treating other patients.  Critical care was necessary to treat or prevent imminent or life-threatening  deterioration.  Critical care was time spent personally by me on the following activities: development of treatment plan with patient and/or surrogate as well as nursing, discussions with consultants, evaluation of patient's response to treatment, examination of patient, obtaining history from patient or surrogate, ordering and performing treatments and interventions, ordering and review of laboratory studies, ordering and review of radiographic studies, pulse oximetry and re-evaluation of patient's condition. care    Rolland Porter, MD 08/02/15 1233  Rolland Porter, MD 08/02/15 [phone]

## 2015-08-02 NOTE — ED Notes (Signed)
Pt. BIB EMS for evaluation of SOB starting today. Pt. Has hx of COPD/CHF. Pt. Denies new swelling to legs. EMS reports wheezing in all fields. Pt. Given albuterol txt by son. Pt. Given additional 5 mg albuterol and 500 mcg atrovent by EMS. Also given 125 solumedrol. Pt. AxO x4. Denies pain.

## 2015-08-03 DIAGNOSIS — E0590 Thyrotoxicosis, unspecified without thyrotoxic crisis or storm: Secondary | ICD-10-CM

## 2015-08-03 DIAGNOSIS — I480 Paroxysmal atrial fibrillation: Secondary | ICD-10-CM

## 2015-08-03 DIAGNOSIS — I5031 Acute diastolic (congestive) heart failure: Secondary | ICD-10-CM

## 2015-08-03 DIAGNOSIS — N184 Chronic kidney disease, stage 4 (severe): Secondary | ICD-10-CM

## 2015-08-03 DIAGNOSIS — J9601 Acute respiratory failure with hypoxia: Secondary | ICD-10-CM

## 2015-08-03 DIAGNOSIS — I160 Hypertensive urgency: Principal | ICD-10-CM | POA: Insufficient documentation

## 2015-08-03 DIAGNOSIS — I10 Essential (primary) hypertension: Secondary | ICD-10-CM

## 2015-08-03 LAB — CBC
HEMATOCRIT: 27.2 % — AB (ref 36.0–46.0)
Hemoglobin: 8.6 g/dL — ABNORMAL LOW (ref 12.0–15.0)
MCH: 26.1 pg (ref 26.0–34.0)
MCHC: 31.6 g/dL (ref 30.0–36.0)
MCV: 82.7 fL (ref 78.0–100.0)
Platelets: 294 10*3/uL (ref 150–400)
RBC: 3.29 MIL/uL — ABNORMAL LOW (ref 3.87–5.11)
RDW: 17.4 % — AB (ref 11.5–15.5)
WBC: 7.2 10*3/uL (ref 4.0–10.5)

## 2015-08-03 LAB — GLUCOSE, CAPILLARY
GLUCOSE-CAPILLARY: 205 mg/dL — AB (ref 65–99)
GLUCOSE-CAPILLARY: 211 mg/dL — AB (ref 65–99)
Glucose-Capillary: 268 mg/dL — ABNORMAL HIGH (ref 65–99)
Glucose-Capillary: 116 mg/dL — ABNORMAL HIGH (ref 65–99)
Glucose-Capillary: 158 mg/dL — ABNORMAL HIGH (ref 65–99)

## 2015-08-03 LAB — BASIC METABOLIC PANEL
Anion gap: 11 (ref 5–15)
BUN: 52 mg/dL — AB (ref 6–20)
CHLORIDE: 103 mmol/L (ref 101–111)
CO2: 32 mmol/L (ref 22–32)
Calcium: 9.2 mg/dL (ref 8.9–10.3)
Creatinine, Ser: 2.19 mg/dL — ABNORMAL HIGH (ref 0.44–1.00)
GFR calc Af Amer: 23 mL/min — ABNORMAL LOW (ref >60)
GFR, EST NON AFRICAN AMERICAN: 20 mL/min — AB (ref >60)
GLUCOSE: 113 mg/dL — AB (ref 65–99)
POTASSIUM: 3.5 mmol/L (ref 3.5–5.1)
Sodium: 146 mmol/L — ABNORMAL HIGH (ref 135–145)

## 2015-08-03 LAB — PROTIME-INR
INR: 2.81 — ABNORMAL HIGH (ref 0.00–1.49)
Prothrombin Time: 29.1 seconds — ABNORMAL HIGH (ref 11.6–15.2)

## 2015-08-03 LAB — MAGNESIUM: Magnesium: 2.4 mg/dL (ref 1.7–2.4)

## 2015-08-03 MED ORDER — INSULIN ASPART 100 UNIT/ML ~~LOC~~ SOLN
0.0000 [IU] | Freq: Every day | SUBCUTANEOUS | Status: DC
  Administered 2015-08-03: 3 [IU] via SUBCUTANEOUS

## 2015-08-03 MED ORDER — TORSEMIDE 20 MG PO TABS
40.0000 mg | ORAL_TABLET | Freq: Every morning | ORAL | Status: DC
  Administered 2015-08-04 – 2015-08-05 (×2): 40 mg via ORAL
  Filled 2015-08-03 (×2): qty 2

## 2015-08-03 MED ORDER — WARFARIN SODIUM 2.5 MG PO TABS
2.5000 mg | ORAL_TABLET | Freq: Once | ORAL | Status: AC
  Administered 2015-08-03: 2.5 mg via ORAL
  Filled 2015-08-03: qty 1

## 2015-08-03 MED ORDER — INSULIN ASPART 100 UNIT/ML ~~LOC~~ SOLN
0.0000 [IU] | Freq: Three times a day (TID) | SUBCUTANEOUS | Status: DC
  Administered 2015-08-03 – 2015-08-04 (×3): 3 [IU] via SUBCUTANEOUS
  Administered 2015-08-04: 2 [IU] via SUBCUTANEOUS
  Administered 2015-08-04 – 2015-08-05 (×2): 1 [IU] via SUBCUTANEOUS
  Administered 2015-08-05: 2 [IU] via SUBCUTANEOUS

## 2015-08-03 MED ORDER — TORSEMIDE 20 MG PO TABS
20.0000 mg | ORAL_TABLET | Freq: Every evening | ORAL | Status: DC
  Administered 2015-08-04: 20 mg via ORAL
  Filled 2015-08-03: qty 1

## 2015-08-03 NOTE — Progress Notes (Signed)
ANTICOAGULATION CONSULT NOTE   Pharmacy Consult for Coumadin Indication: atrial fibrillation  Allergies  Allergen Reactions  . Yellow Dyes (Non-Tartrazine) Nausea And Vomiting    "deathly allergic" No other information given to explain reaction. Per Daughter- this was an IV dye. Ok with yellow coloring on Coumadin tablets.   . Codeine Hives  . Other Other (See Comments)    novocaine broke mouth out  . Penicillins Hives  . Procaine Hives    Patient Measurements: Weight: 132 lb 7.9 oz (60.1 kg)  Vital Signs: Temp: 98 F (36.7 C) (03/09 1017) Temp Source: Oral (03/09 1017) BP: 118/29 mmHg (03/09 1017) Pulse Rate: 64 (03/09 1017)  Labs:  Recent Labs  08/02/15 1023 08/02/15 1817 08/02/15 2208 08/03/15 0703  HGB 9.2*  --   --  8.6*  HCT 29.9*  --   --  27.2*  PLT 317  --   --  294  LABPROT  --  26.9*  --  29.1*  INR  --  2.52*  --  2.81*  CREATININE 2.22*  --   --  2.Alexis*  TROPONINI  --  0.05* 0.05*  --     Estimated Creatinine Clearance: 15.9 mL/min (by C-G formula based on Cr of 2.Alexis).   Medical History: Past Medical History  Diagnosis Date  . Hypertension   . Neuropathy (HCC)   . Anxiety   . PONV (postoperative nausea and vomiting)   . CHF (congestive heart failure) (HCC)   . COPD (chronic obstructive pulmonary disease) (HCC)     on home, nocturnal oxygen.   . Family history of adverse reaction to anesthesia   . Cardiomyopathy (HCC)   . Diabetes mellitus without complication (HCC)     TYPE 2  . Macular degeneration, bilateral   . Varicose veins   . GIB (gastrointestinal bleeding)   . Paroxysmal atrial fibrillation (HCC)   . Shortness of breath dyspnea   . Acute on chronic diastolic heart failure (HCC) 12/22/2014  . Confusion state      Assessment: Alexis [name] admitted 08/02/2015 after syncopal episode. Pt on warf PTA for AFib, home dose 2.5 mg daily.  INR therapeutic, no bleeding or complications noted.   Goal of Therapy:  INR 2-3 Monitor platelets by  anticoagulation protocol: Yes   Plan:  Coumadin 2.5 mg x1 tonight Daily INR/CBC Monitor for s/s of bleeding  Tad Moore, BCPS  Clinical Pharmacist Pager [phone]  08/03/2015 10:47 AM

## 2015-08-03 NOTE — Progress Notes (Signed)
[facility] PROGRESS NOTE  Alexis [name] PIR:[id number] DOB: 1931/09/04 DOA: 08/02/2015 PCP: Elizabeth Palau, FNP  Assessment/Plan: #1 acute respiratory failure secondary to acute on chronic diastolic heart failure Patient with clinical improvement. BNP on admission was 1083 and patient was initially requiring BiPAP. Patient currently on 2 L nasal cannula with sats of 100%. Chest x-ray consistent with volume overload. Patient was -2.475 mL over the past 24 hours. Cardiac enzymes minimally elevated and have plateaued and likely secondary to volume overload. Patient recent 2-D echo done 06/12/2015 and as such will not repeat. Continue current IV Lasix and transitioned to oral diuretics tomorrow. Continue Lipitor, hydralazine,IMDUR, Lopressor.  #2 hypertensive urgency Patient admission noted to have a blood pressure of 204/93 which was likely etiology of patient's chest pain on presentation. Blood pressure improved. Continue current regimen of Lopressor, hydralazine,IMDUR.  #3 hyperthyroidism TSH was 2.261 on 06/30/2015. Continue Tapazole. Continue Lopressor.  #4 chronic kidney disease stage IV Stable. Monitor closely with diuresis.  #5 diabetes mellitus with neuropathy Hemoglobin A1c was 6.3 on 06/21/2015. Continue sliding scale insulin. Continue Neurontin.  #6 hyperlipidemia Continue statin, fenofibrate.  #7 depression Stable. Continue Lexapro.  #8 atrial fibrillation continue Lopressor for rate control. Coumadin for anticoagulation.  #9 prophylaxis INR 2.81. Coumadin for DVT prophylaxis.    Code Status: DO NOT RESUSCITATE Family Communication: Updated patient and son at bedside. Disposition Plan: Probably back to skilled nursing facility when medically stable.   Consultants:  None  Procedures:  Chest x-ray 08/02/2015  Antibiotics:  None  HPI/Subjective: Patient states shortness of breath has improved. Patient denies any chest pain. Patient states had  been on a low salt diet since last discharge.  Objective: Filed Vitals:   08/03/15 0508 08/03/15 1017  BP: 146/43 118/29  Pulse: 52 64  Temp: 97.8 F (36.6 C) 98 F (36.7 C)  Resp: 16 18    Intake/Output Summary (Last 24 hours) at 08/03/15 1135 Last data filed at 08/03/15 1107  Gross per 24 hour  Intake    120 ml  Output   2650 ml  Net  -2530 ml   Filed Weights   08/02/15 1934  Weight: 60.1 kg (132 lb 7.9 oz)    Exam:   General:  NAD  Cardiovascular: RRR.+ jvd  Respiratory: Diffuse crackles, no wheezing  Abdomen: Soft, nontender, nondistended, positive bowel sounds.  Musculoskeletal: No clubbing cyanosis or edema.  Data Reviewed: Basic Metabolic Panel:  Recent Labs Lab 08/02/15 1023 08/03/15 0703  NA 145 146*  K 3.7 3.5  CL 105 103  CO2 26 32  GLUCOSE 199* 113*  BUN 48* 52*  CREATININE 2.22* 2.19*  CALCIUM 9.7 9.2  MG  --  2.4   Liver Function Tests: No results for input(s): AST, ALT, ALKPHOS, BILITOT, PROT, ALBUMIN in the last 168 hours. No results for input(s): LIPASE, AMYLASE in the last 168 hours. No results for input(s): AMMONIA in the last 168 hours. CBC:  Recent Labs Lab 08/02/15 1023 08/03/15 0703  WBC 8.6 7.2  NEUTROABS 6.9  --   HGB 9.2* 8.6*  HCT 29.9* 27.2*  MCV 83.1 82.7  PLT 317 294   Cardiac Enzymes:  Recent Labs Lab 08/02/15 1817 08/02/15 2208  TROPONINI 0.05* 0.05*   BNP (last 3 results)  Recent Labs  06/11/15 1755 06/20/15 1630 08/02/15 1023  BNP 914.1* 1716.4* 1083.4*    ProBNP (last 3 results) No results for input(s): PROBNP in the last 8760 hours.  CBG:  Recent Labs Lab 08/02/15 2339 08/03/15  0219 08/03/15 0739  GLUCAP 319* 268* 116*    No results found for this or any previous visit (from the past 240 hour(s)).   Studies: Dg Chest Portable 1 View  08/02/2015  CLINICAL DATA:  Shortness of breath. EXAM: PORTABLE CHEST 1 VIEW COMPARISON:  07/02/2015. FINDINGS: Mediastinum hilar structures  normal. Cardiomegaly with progressive bilateral pulmonary infiltrates consistent pulmonary edema. Bilateral pneumonia could present this fashion. Small right pleural effusion. No pneumothorax. IMPRESSION: Cardiomegaly with progressive bilateral pulmonary infiltrates consistent with congestive heart failure with pulmonary edema. Bilateral pneumonia could also present and this fashion. Small right pleural effusion. Electronically Signed   By: Maisie Fus  Register   On: 08/02/2015 09:45    Scheduled Meds: . atorvastatin  20 mg Oral QHS  . escitalopram  10 mg Oral Daily  . fenofibrate  160 mg Oral Daily  . furosemide  80 mg Intravenous BID  . gabapentin  100 mg Oral Daily  . hydrALAZINE  50 mg Oral Q12H  . insulin aspart  0-5 Units Subcutaneous QHS  . insulin aspart  0-9 Units Subcutaneous TID WC  . isosorbide mononitrate  30 mg Oral Daily  . methimazole  5 mg Oral Daily  . metoprolol tartrate  25 mg Oral BID  . potassium chloride SA  20 mEq Oral Daily  . sodium chloride flush  3 mL Intravenous Q12H  . warfarin  2.5 mg Oral ONCE-1800  . Warfarin - Pharmacist Dosing Inpatient   Does not apply q1800   Continuous Infusions:   Principal Problem:   Acute respiratory failure (HCC) Active Problems:   Hypertension   COPD (chronic obstructive pulmonary disease) (HCC)   Paroxysmal atrial fibrillation (HCC)   Hyperthyroidism   CKD (chronic kidney disease), stage IV (HCC)   Acute diastolic CHF (congestive heart failure) (HCC)    Time spent: 35 MINS    [facility] MD [facility] Pager [phone]. If 7PM-7AM, please contact night-coverage at [email or web address], password [facility] 08/03/2015, 11:35 AM  LOS: 1 day

## 2015-08-04 LAB — CBC
HEMATOCRIT: 27.9 % — AB (ref 36.0–46.0)
HEMOGLOBIN: 8.3 g/dL — AB (ref 12.0–15.0)
MCH: 24.5 pg — ABNORMAL LOW (ref 26.0–34.0)
MCHC: 29.7 g/dL — AB (ref 30.0–36.0)
MCV: 82.3 fL (ref 78.0–100.0)
Platelets: 313 10*3/uL (ref 150–400)
RBC: 3.39 MIL/uL — ABNORMAL LOW (ref 3.87–5.11)
RDW: 17.4 % — ABNORMAL HIGH (ref 11.5–15.5)
WBC: 5.6 10*3/uL (ref 4.0–10.5)

## 2015-08-04 LAB — BASIC METABOLIC PANEL
ANION GAP: 8 (ref 5–15)
BUN: 60 mg/dL — AB (ref 6–20)
CALCIUM: 8.9 mg/dL (ref 8.9–10.3)
CO2: 31 mmol/L (ref 22–32)
Chloride: 103 mmol/L (ref 101–111)
Creatinine, Ser: 2.4 mg/dL — ABNORMAL HIGH (ref 0.44–1.00)
GFR calc Af Amer: 21 mL/min — ABNORMAL LOW (ref >60)
GFR, EST NON AFRICAN AMERICAN: 18 mL/min — AB (ref >60)
GLUCOSE: 157 mg/dL — AB (ref 65–99)
Potassium: 3.4 mmol/L — ABNORMAL LOW (ref 3.5–5.1)
Sodium: 142 mmol/L (ref 135–145)

## 2015-08-04 LAB — PROTIME-INR
INR: 2.51 — AB (ref 0.00–1.49)
Prothrombin Time: 26.8 seconds — ABNORMAL HIGH (ref 11.6–15.2)

## 2015-08-04 LAB — GLUCOSE, CAPILLARY
GLUCOSE-CAPILLARY: 147 mg/dL — AB (ref 65–99)
GLUCOSE-CAPILLARY: 158 mg/dL — AB (ref 65–99)
Glucose-Capillary: 233 mg/dL — ABNORMAL HIGH (ref 65–99)
Glucose-Capillary: 228 mg/dL — ABNORMAL HIGH (ref 65–99)

## 2015-08-04 LAB — PROCALCITONIN: Procalcitonin: 0.53 ng/mL

## 2015-08-04 LAB — MAGNESIUM: Magnesium: 2.3 mg/dL (ref 1.7–2.4)

## 2015-08-04 MED ORDER — POTASSIUM CHLORIDE CRYS ER 20 MEQ PO TBCR
40.0000 meq | EXTENDED_RELEASE_TABLET | Freq: Once | ORAL | Status: AC
  Administered 2015-08-04: 40 meq via ORAL
  Filled 2015-08-04: qty 2

## 2015-08-04 MED ORDER — WARFARIN SODIUM 2.5 MG PO TABS
2.5000 mg | ORAL_TABLET | Freq: Once | ORAL | Status: AC
  Administered 2015-08-04: 2.5 mg via ORAL
  Filled 2015-08-04: qty 1

## 2015-08-04 NOTE — Care Management Note (Addendum)
Case Management Note  Patient Details  Name: Alexis [name] MRN: [id number] Date of Birth: 22-Aug-1931  Subjective/Objective:          CM following for progression and d/c planning.          Action/Plan: 08/04/2015 Met with pt who has [place] however does not know the name of the agency. Pt gave this CM permission to call children. As pt lives with son, this CM call him first and left a message re [facility] information. No response from son, so CM contacted pt daughter, Mrs. Dorothyann Peng, who states that she would get the agency name from her brother, the pt's son. Daughter was able to inform this CM that the [facility] provider was [facility]. This CM contacted Jeri Lager RN representative. Per Alvis Lemmings this pt has [facility] and HHPT services, noted that MD has recommended CHF program. Orders to resume services and add CHF program will be entered.  Family requesting hospital be as pt currently must sleep in recliner due to need to keep her head elevated.  Expected Discharge Date:                  Expected Discharge Plan:  [place]  In-House Referral:  NA  Discharge planning Services  CM Consult  Post Acute Care Choice:  Durable Medical Equipment, Home Health Choice offered to:  Adult Children  DME Arranged:    DME Agency:     HH Arranged:  RN, PT HH Agency:  [place]  Status of Service:  In process, will continue to follow  Medicare Important Message Given:  Yes Date Medicare IM Given:    Medicare IM give by:    Date Additional Medicare IM Given:    Additional Medicare Important Message give by:     If discussed at [place] of Stay Meetings, dates discussed:    Additional Comments:  Adron Bene, RN 08/04/2015, 2:15 PM

## 2015-08-04 NOTE — Progress Notes (Signed)
[facility] PROGRESS NOTE  Alexis [name] JFH:[id number] DOB: 31-Aug-1931 DOA: 08/02/2015 PCP: Elizabeth Palau, FNP  Assessment/Plan: #1 acute respiratory failure secondary to acute on chronic diastolic heart failure Patient with clinical improvement. BNP on admission was 1083 and patient was initially requiring BiPAP. Patient currently on 2 L nasal cannula with sats of 100%. Chest x-ray consistent with volume overload. Patient was -2.475 mL over the past 24 hours. Cardiac enzymes minimally elevated and have plateaued and likely secondary to volume overload. Patient recent 2-D echo done 06/12/2015 and as such will not repeat. Transitioned to oral diuretics. Continue Lipitor, hydralazine,IMDUR, Lopressor.  #2 hypertensive urgency Patient admission noted to have a blood pressure of 204/93 which was likely etiology of patient's chest pain on presentation. Blood pressure improved. Continue current regimen of Lopressor, hydralazine,IMDUR.  #3 hyperthyroidism TSH was 2.261 on 06/30/2015. Continue Tapazole. Continue Lopressor.  #4 chronic kidney disease stage IV Stable. Monitor closely with diuresis.  #5 diabetes mellitus with neuropathy Hemoglobin A1c was 6.3 on 06/21/2015. Continue sliding scale insulin. Continue Neurontin.  #6 hyperlipidemia Continue statin, fenofibrate.  #7 depression Stable. Continue Lexapro.  #8 atrial fibrillation continue Lopressor for rate control. Coumadin for anticoagulation.  #9 prophylaxis INR 2.81. Coumadin for DVT prophylaxis.    Code Status: DO NOT RESUSCITATE Family Communication: Updated patient and son at bedside. Disposition Plan: Home with home health if renal function stable and continued clinical improvement.   Consultants:  None  Procedures:  Chest x-ray 08/02/2015  Antibiotics:  None  HPI/Subjective: Patient states shortness of breath has improved. Patient denies any chest pain.   Objective: Filed Vitals:   08/04/15  0454 08/04/15 0753  BP: 145/73 166/46  Pulse: 55 62  Temp: 97.8 F (36.6 C) 98 F (36.7 C)  Resp: 18 17    Intake/Output Summary (Last 24 hours) at 08/04/15 1422 Last data filed at 08/04/15 1000  Gross per 24 hour  Intake    240 ml  Output    600 ml  Net   -360 ml   Filed Weights   08/02/15 1934 08/03/15 2125  Weight: 60.1 kg (132 lb 7.9 oz) 60.2 kg (132 lb 11.5 oz)    Exam:   General:  NAD  Cardiovascular: RRR  Respiratory: Bibasilar crackles, no wheezing  Abdomen: Soft, nontender, nondistended, positive bowel sounds.  Musculoskeletal: No clubbing cyanosis or edema.  Data Reviewed: Basic Metabolic Panel:  Recent Labs Lab 08/02/15 1023 08/03/15 0703 08/04/15 0503  NA 145 146* 142  K 3.7 3.5 3.4*  CL 105 103 103  CO2 26 32 31  GLUCOSE 199* 113* 157*  BUN 48* 52* 60*  CREATININE 2.22* 2.19* 2.40*  CALCIUM 9.7 9.2 8.9  MG  --  2.4 2.3   Liver Function Tests: No results for input(s): AST, ALT, ALKPHOS, BILITOT, PROT, ALBUMIN in the last 168 hours. No results for input(s): LIPASE, AMYLASE in the last 168 hours. No results for input(s): AMMONIA in the last 168 hours. CBC:  Recent Labs Lab 08/02/15 1023 08/03/15 0703 08/04/15 0503  WBC 8.6 7.2 5.6  NEUTROABS 6.9  --   --   HGB 9.2* 8.6* 8.3*  HCT 29.9* 27.2* 27.9*  MCV 83.1 82.7 82.3  PLT 317 294 313   Cardiac Enzymes:  Recent Labs Lab 08/02/15 1817 08/02/15 2208  TROPONINI 0.05* 0.05*   BNP (last 3 results)  Recent Labs  06/11/15 1755 06/20/15 1630 08/02/15 1023  BNP 914.1* 1716.4* 1083.4*    ProBNP (last 3 results) No results for  input(s): PROBNP in the last 8760 hours.  CBG:  Recent Labs Lab 08/03/15 1218 08/03/15 1616 08/03/15 2120 08/04/15 0751 08/04/15 1145  GLUCAP 205* 211* 158* 147* 233*    No results found for this or any previous visit (from the past 240 hour(s)).   Studies: No results found.  Scheduled Meds: . atorvastatin  20 mg Oral QHS  . escitalopram   10 mg Oral Daily  . fenofibrate  160 mg Oral Daily  . gabapentin  100 mg Oral Daily  . hydrALAZINE  50 mg Oral Q12H  . insulin aspart  0-5 Units Subcutaneous QHS  . insulin aspart  0-9 Units Subcutaneous TID WC  . isosorbide mononitrate  30 mg Oral Daily  . methimazole  5 mg Oral Daily  . metoprolol tartrate  25 mg Oral BID  . potassium chloride SA  20 mEq Oral Daily  . sodium chloride flush  3 mL Intravenous Q12H  . torsemide  20 mg Oral QPM  . torsemide  40 mg Oral q morning - 10a  . warfarin  2.5 mg Oral ONCE-1800  . Warfarin - Pharmacist Dosing Inpatient   Does not apply q1800   Continuous Infusions:   Principal Problem:   Acute respiratory failure (HCC) Active Problems:   Hypertension   COPD (chronic obstructive pulmonary disease) (HCC)   Paroxysmal atrial fibrillation (HCC)   Hyperthyroidism   CKD (chronic kidney disease), stage IV (HCC)   Acute diastolic CHF (congestive heart failure) (HCC)   Hypertensive urgency    Time spent: 35 MINS    [facility] MD [facility] Pager [phone]. If 7PM-7AM, please contact night-coverage at [email or web address], password [facility] 08/04/2015, 2:22 PM  LOS: 2 days

## 2015-08-04 NOTE — Evaluation (Signed)
Physical Therapy Evaluation and Discharge Patient Details Name: Alexis [name] MRN: [id number] DOB: [birth year] Today's Date: 08/04/2015   History of Present Illness  82yo F admitted with shortness of breath. PMH: CHF, COPD. Recent admission for acute on chronic diastolic heart failure, respiratory failure, and hypoglycemia.  Clinical Impression  Patient evaluated by Physical Therapy with no further acute PT needs identified. All education has been completed and the patient has no further questions. States that she feels back to her baseline. SpO2 maintained 93% and greater on 2L supplemental O2 throughout therapy session. No loss of balance noted with gait while using a rolling walker. No physical assist required for transfer. Complains of lower back pain from sitting in recliner. See below for any follow-up Physical Therapy or equipment needs. PT is signing off. Thank you for this referral.     Follow Up Recommendations Other (comment) (OPPT if back pain persists)    Equipment Recommendations  None recommended by PT    Recommendations for Other Services       Precautions / Restrictions Precautions Precautions: Fall Restrictions Weight Bearing Restrictions: No      Mobility  Bed Mobility               General bed mobility comments: in recliner  Transfers Overall transfer level: Needs assistance Equipment used: Rolling walker (2 wheeled) Transfers: Sit to/from Stand Sit to Stand: Supervision         General transfer comment: good power up. Min sway, improved with RW for support.  Ambulation/Gait Ambulation/Gait assistance: Supervision Ambulation Distance (Feet): 175 Feet Assistive device: Rolling walker (2 wheeled) Gait Pattern/deviations: Step-through pattern;Decreased stride length;Trunk flexed Gait velocity: decreased Gait velocity interpretation: Below normal speed for age/gender General Gait Details: No overt loss of balance noted with light support  from RW for support. Minimal dyspnea. VC for upright posture intermittently. Needed cues to find room upon return. SpO2 93% on 2L supplemental 02 with gait.  Stairs            Wheelchair Mobility    Modified Rankin (Stroke Patients Only)       Balance Overall balance assessment: Needs assistance Sitting-balance support: No upper extremity supported;Feet supported Sitting balance-Leahy Scale: Normal     Standing balance support: No upper extremity supported Standing balance-Leahy Scale: Fair                               Pertinent Vitals/Pain Pain Assessment: Faces Faces Pain Scale: Hurts little more Pain Location: lower back Pain Descriptors / Indicators: Aching Pain Intervention(s): Monitored during session;Repositioned    Home Living Family/patient expects to be discharged to:: Private residence Living Arrangements: Children Available Help at Discharge: Family;Available 24 hours/day Type of Home: House Home Access: Stairs to enter Entrance Stairs-Rails: None Entrance Stairs-Number of Steps: 1 ("threshold") Home Layout: One level Home Equipment: Shower seat;Bedside commode;Walker - 4 wheels;Other (comment)      Prior Function Level of Independence: Needs assistance   Gait / Transfers Assistance Needed: ambulates with rollator  ADL's / Homemaking Assistance Needed: son assists occasionally with getting dressed, son does all IADLs, including driving        Hand Dominance   Dominant Hand: Right    Extremity/Trunk Assessment   Upper Extremity Assessment: Defer to OT evaluation           Lower Extremity Assessment: Generalized weakness         Communication   Communication: [facility]  Cognition Arousal/Alertness: Awake/alert Behavior During Therapy: WFL for tasks assessed/performed Overall Cognitive Status: History of cognitive impairments - at baseline       Memory: Decreased short-term memory              General Comments  General comments (skin integrity, edema, etc.): Reports lower back pain after sitting in recliner.    Exercises        Assessment/Plan    PT Assessment Patent does not need any further PT services  PT Diagnosis Abnormality of gait;Generalized weakness;Acute pain   PT Problem List    PT Treatment Interventions     PT Goals (Current goals can be found in the Care Plan section) Acute Rehab PT Goals Patient Stated Goal: none stated PT Goal Formulation: All assessment and education complete, DC therapy    Frequency     Barriers to discharge        Co-evaluation               End of Session Equipment Utilized During Treatment: Gait belt;Oxygen Activity Tolerance: Patient tolerated treatment well Patient left: in chair;with call bell/phone within reach Nurse Communication: Mobility status         Time: 1610-9604 PT Time Calculation (min) (ACUTE ONLY): 13 min   Charges:   PT Evaluation $PT Eval Low Complexity: 1 Procedure     PT G CodesBerton Mount 08/04/2015, 11:15 AM  Charlsie Merles, PT [phone]

## 2015-08-04 NOTE — Care Management Important Message (Signed)
Important Message  Patient Details  Name: Alexis [name] MRN: [id number] Date of Birth: 1932-01-31   Medicare Important Message Given:  Yes    Charlotta Lapaglia P Tyneisha Hegeman 08/04/2015, 1:06 PM

## 2015-08-04 NOTE — Progress Notes (Signed)
Occupational Therapy Evaluation Patient Details Name: Alexis [name] MRN: [id number] DOB: September 03, 1931 Today's Date: 08/04/2015    History of Present Illness 82yo F admitted with shortness of breath. PMH: CHF, COPD. Recent admission for acute on chronic diastolic heart failure, respiratory failure, and hypoglycemia.   Clinical Impression   Patient presents to OT with above diagnoses and below functional deficits, impacting ADL independence and safety. Will benefit from skilled OT to maximize function and facilitate a safe discharge. OT will follow.    Follow Up Recommendations  No OT follow up;Supervision/Assistance - 24 hour    Equipment Recommendations  None recommended by OT    Recommendations for Other Services       Precautions / Restrictions Precautions Precautions: Fall Restrictions Weight Bearing Restrictions: No      Mobility Bed Mobility               General bed mobility comments: NT -- OOB in recliner  Transfers Overall transfer level: Needs assistance Equipment used: Rolling walker (2 wheeled) Transfers: Sit to/from Stand Sit to Stand: Min guard              Balance                                            ADL Overall ADL's : Needs assistance/impaired Eating/Feeding: Set up;Sitting   Grooming: Wash/dry hands;Wash/dry face;Brushing hair;Min guard;Standing   Upper Body Bathing: Set up;Sitting   Lower Body Bathing: Minimal assistance;Sit to/from stand   Upper Body Dressing : Set up;Sitting   Lower Body Dressing: Minimal assistance;Sit to/from stand Lower Body Dressing Details (indicate cue type and reason): donned bedroom shoes with setup only from seated position Toilet Transfer: Min guard;Ambulation;Comfort height toilet;RW           Functional mobility during ADLs: Min guard;Rolling walker       Vision     Perception     Praxis      Pertinent Vitals/Pain Pain Assessment: No/denies pain      Hand Dominance Right   Extremity/Trunk Assessment Upper Extremity Assessment Upper Extremity Assessment: Generalized weakness   Lower Extremity Assessment Lower Extremity Assessment: Defer to PT evaluation       Communication Communication Communication: HOH   Cognition Arousal/Alertness: Awake/alert Behavior During Therapy: WFL for tasks assessed/performed Overall Cognitive Status: History of cognitive impairments - at baseline       Memory: Decreased short-term memory             General Comments       Exercises       Shoulder Instructions      Home Living Family/patient expects to be discharged to:: Private residence Living Arrangements: Children Available Help at Discharge: Family;Available 24 hours/day Type of Home: House Home Access: Stairs to enter [employer] of Steps: 1 Entrance Stairs-Rails: None Home Layout: One level     Bathroom Shower/Tub: Walk-in shower;Door   [employer] Toilet: Handicapped height Bathroom Accessibility: Yes How Accessible: Accessible via walker Home Equipment: Shower seat;Bedside commode;Walker - 4 wheels;Other (comment)          Prior Functioning/Environment Level of Independence: Needs assistance  Gait / Transfers Assistance Needed: ambulates with rollator ADL's / Homemaking Assistance Needed: son assists occasionally with getting dressed, son does all IADLs, including driving        OT Diagnosis: Generalized weakness   OT Problem List: Decreased  strength;Decreased activity tolerance;Impaired balance (sitting and/or standing);Decreased safety awareness;Decreased knowledge of use of DME or AE;Cardiopulmonary status limiting activity   OT Treatment/Interventions: Self-care/ADL training;Energy conservation;DME and/or AE instruction;Therapeutic activities;Patient/family education    OT Goals(Current goals can be found in the care plan section) Acute Rehab OT Goals Patient Stated Goal: none stated OT  Goal Formulation: With patient Time For Goal Achievement: 08/18/15 Potential to Achieve Goals: Good ADL Goals Pt Will Perform Upper Body Bathing: with modified independence;sitting Pt Will Perform Lower Body Bathing: with modified independence;sit to/from stand Pt Will Perform Upper Body Dressing: with modified independence;sitting Pt Will Perform Lower Body Dressing: with modified independence;sit to/from stand Pt Will Transfer to Toilet: with supervision;ambulating Pt Will Perform Toileting - Clothing Manipulation and hygiene: with supervision;sit to/from stand Pt Will Perform Tub/Shower Transfer: Shower transfer;with supervision;ambulating;shower seat Additional ADL Goal #1: Patient will utilize 3 energy conservation techniques during BADLs  OT Frequency: Min 2X/week   Barriers to D/C:            Co-evaluation              End of Session Equipment Utilized During Treatment: Rolling walker;Oxygen  Activity Tolerance: Patient tolerated treatment well Patient left: in chair;with call bell/phone within reach   Time: 0902-0918 OT Time Calculation (min): 16 min Charges:  OT General Charges $OT Visit: 1 Procedure OT Evaluation $OT Eval Moderate Complexity: 1 Procedure G-Codes:    Holli Rengel A 08-21-15, 9:28 AM

## 2015-08-04 NOTE — Progress Notes (Signed)
ANTICOAGULATION CONSULT NOTE   Pharmacy Consult for Coumadin Indication: atrial fibrillation  Allergies  Allergen Reactions  . Yellow Dyes (Non-Tartrazine) Nausea And Vomiting    "deathly allergic" No other information given to explain reaction. Per Daughter- this was an IV dye. Ok with yellow coloring on Coumadin tablets.   . Codeine Hives  . Other Other (See Comments)    novocaine broke mouth out  . Penicillins Hives  . Procaine Hives    Patient Measurements: Weight: 132 lb 11.5 oz (60.2 kg)  Vital Signs: Temp: 98 F (36.7 C) (03/10 0753) Temp Source: Oral (03/10 0753) BP: 166/46 mmHg (03/10 0753) Pulse Rate: 62 (03/10 0753)  Labs:  Recent Labs  08/02/15 1023 08/02/15 1817 08/02/15 2208 08/03/15 0703 08/04/15 0503  HGB 9.2*  --   --  8.6* 8.3*  HCT 29.9*  --   --  27.2* 27.9*  PLT 317  --   --  294 313  LABPROT  --  26.9*  --  29.1* 26.8*  INR  --  2.52*  --  2.81* 2.51*  CREATININE 2.22*  --   --  2.19* 2.40*  TROPONINI  --  0.05* 0.05*  --   --     Estimated Creatinine Clearance: 15.7 mL/min (by C-G formula based on Cr of 2.4).   Medical History: Past Medical History  Diagnosis Date  . Hypertension   . Neuropathy (HCC)   . Anxiety   . PONV (postoperative nausea and vomiting)   . CHF (congestive heart failure) (HCC)   . COPD (chronic obstructive pulmonary disease) (HCC)     on home, nocturnal oxygen.   . Family history of adverse reaction to anesthesia   . Cardiomyopathy (HCC)   . Diabetes mellitus without complication (HCC)     TYPE 2  . Macular degeneration, bilateral   . Varicose veins   . GIB (gastrointestinal bleeding)   . Paroxysmal atrial fibrillation (HCC)   . Shortness of breath dyspnea   . Acute on chronic diastolic heart failure (HCC) 12/22/2014  . Confusion state      Assessment: 44 yof admitted 08/02/2015 after syncopal episode. Pt on warf 2.5mg  daily PTA for AFib. INR on admit 2.52, today's INR is 2.51. Hgb 8.3, plts wnl. no  bleeding noted  Goal of Therapy:  INR 2-3 Monitor platelets by anticoagulation protocol: Yes   Plan:  Give coumadin 2.5mg  PO x 1 tonight Monitor daily INR, CBC, s/s of bleed  Enzo Bi, PharmD, BCPS Clinical Pharmacist Pager [phone] 08/04/2015 10:00 AM

## 2015-08-04 NOTE — Progress Notes (Signed)
Utilization review completed. Manson Luckadoo, RN, BSN. 

## 2015-08-04 NOTE — Progress Notes (Signed)
Pt. only used BiPAP initially in the ED.

## 2015-08-05 LAB — BASIC METABOLIC PANEL
Anion gap: 13 (ref 5–15)
BUN: 68 mg/dL — ABNORMAL HIGH (ref 6–20)
CHLORIDE: 102 mmol/L (ref 101–111)
CO2: 30 mmol/L (ref 22–32)
CREATININE: 2.46 mg/dL — AB (ref 0.44–1.00)
Calcium: 9.1 mg/dL (ref 8.9–10.3)
GFR calc non Af Amer: 17 mL/min — ABNORMAL LOW (ref >60)
GFR, EST AFRICAN AMERICAN: 20 mL/min — AB (ref >60)
GLUCOSE: 154 mg/dL — AB (ref 65–99)
Potassium: 3.7 mmol/L (ref 3.5–5.1)
Sodium: 145 mmol/L (ref 135–145)

## 2015-08-05 LAB — CBC
HEMATOCRIT: 27.2 % — AB (ref 36.0–46.0)
HEMOGLOBIN: 8.1 g/dL — AB (ref 12.0–15.0)
MCH: 24.6 pg — ABNORMAL LOW (ref 26.0–34.0)
MCHC: 29.8 g/dL — AB (ref 30.0–36.0)
MCV: 82.7 fL (ref 78.0–100.0)
Platelets: 311 10*3/uL (ref 150–400)
RBC: 3.29 MIL/uL — ABNORMAL LOW (ref 3.87–5.11)
RDW: 17.1 % — ABNORMAL HIGH (ref 11.5–15.5)
WBC: 4.1 10*3/uL (ref 4.0–10.5)

## 2015-08-05 LAB — GLUCOSE, CAPILLARY
GLUCOSE-CAPILLARY: 158 mg/dL — AB (ref 65–99)
Glucose-Capillary: 144 mg/dL — ABNORMAL HIGH (ref 65–99)

## 2015-08-05 LAB — PROTIME-INR
INR: 2.38 — AB (ref 0.00–1.49)
Prothrombin Time: 25.7 seconds — ABNORMAL HIGH (ref 11.6–15.2)

## 2015-08-05 MED ORDER — GABAPENTIN 100 MG PO CAPS: 100.0000 mg | ORAL_CAPSULE | Freq: Every day | ORAL | Status: AC

## 2015-08-05 MED ORDER — WARFARIN SODIUM 2.5 MG PO TABS: 2.5000 mg | ORAL_TABLET | Freq: Once | ORAL | Status: DC

## 2015-08-05 MED ORDER — TORSEMIDE 20 MG PO TABS: ORAL_TABLET | ORAL | Status: DC

## 2015-08-05 NOTE — Discharge Summary (Signed)
Physician Discharge Summary  Alexis [name] ZOX:[id number] DOB: Jul 25, 1931 DOA: 08/02/2015  PCP: Elizabeth Palau, FNP  Admit date: 08/02/2015 Discharge date: 08/05/2015  Time spent: 65 minutes  Recommendations for Outpatient Follow-up:  1. Follow-up with ANDERSON,TERESA, FNP  In 1 to 2 weeks. On follow-up patient will need a basic metabolic profile done to follow-up on electrolytes and renal function. 2. Follow-up with cardiology as scheduled.   Discharge Diagnoses:  Principal Problem:   Acute respiratory failure (HCC) Active Problems:   Hypertension   COPD (chronic obstructive pulmonary disease) (HCC)   Paroxysmal atrial fibrillation (HCC)   Hyperthyroidism   CKD (chronic kidney disease), stage IV (HCC)   Acute diastolic CHF (congestive heart failure) (HCC)   Hypertensive urgency   Discharge Condition: stable and improved  Diet recommendation: heart healthy  Filed Weights   08/02/15 1934 08/03/15 2125 08/04/15 2123  Weight: 60.1 kg (132 lb 7.9 oz) 60.2 kg (132 lb 11.5 oz) 62 kg (136 lb 11 oz)    History of present illness:  Per Dr Konrad Dolores Patient very elderly and extremely poor historian. Question if there is undiagnosed underlying dementia contributing to patient's state. Currently patient is at her normal mental state per patient's daughter and son. History provided in part by EDP as well as patient's children and the patient. Of note patient's family cares for her and provides her with her medications. Patient has no idea what medicine she is on.  Shortness of breath. Acute onset. Started the morning of admission, when patient awoke from sleeping overnight. Associated with feelings of dizziness and weakness and intermittent chest pain. Patient stated that she is normally on 2 L nasal cannula. Over the past week patient has had increased her home O2 to 3 L due to chronic shortness of breath and weakness.   Of note patient left [facility] on 07/05/2015 after  admission for acute on chronic diastolic heart failure, respiratory failure, and hypoglycemia.   Hospital Course:  #1 acute respiratory failure secondary to acute on chronic diastolic heart failure Patient was admitted with acute respiratory failure felt to be secondary to acute on chronic diastolic heart failure. BNP on admission was 1083 and patient was initially requiring BiPAP. Patient was diuresed with IV Lasix and respiratory failure improved and patient was weaned down to nasal cannula oxygen. Chest x-ray on admission was consistent with volume overload. Patient stated she was compliant with her medications and her diet. It was noted that at prior discharge patient was supposed to be on Demadex 40 mg in the morning and 20 mg at bedtime however on admission patient was on 20 mg of Demadex twice daily which she stated was what she was discharged from skilled nursing facility with. Patient was diuresed on IV Lasix and was -3.7-5 L during the hospitalization. Cardiac enzymes which was cycled were minimally elevated and have plateaued and likely secondary to volume overload. Patient recent 2-D echo done 06/12/2015 and as such was not repeated. Patient was subsequently transitioned to oral Demadex 40 mg in the morning and 20 mg at bedtime. Patient's renal function remained stable. Patient improved clinically and was euvolemic by day of discharge. Patient was also maintained on a cardiac regimen of Lipitor, hydralazine,IMDUR, Lopressor. Outpatient follow-up.  #2 hypertensive urgency Patient admission noted to have a blood pressure of 204/93 which was likely etiology of patient's chest pain on presentation. Blood pressure improved. Patient was maintained on her home regimen of Lopressor, hydralazine,IMDUR.  #3 hyperthyroidism TSH was 2.261 on 06/30/2015. Continued  on Tapazole and Lopressor.  #4 chronic kidney disease stage IV Stable.   #5 diabetes mellitus with neuropathy Hemoglobin A1c was 6.3 on  06/21/2015. Patient was maintained on a sliding scale insulin and continued on a home regimen of Neurontin.  #6 hyperlipidemia Continued on a home regimen of statin, fenofibrate.  #7 depression Stable. Continued on home regimen of Lexapro.  #8 atrial fibrillation Patient was maintained on Lopressor for rate control. Coumadin for anticoagulation.  Procedures:  Chest x-ray 08/02/2015  Consultations:  None  Discharge Exam: Filed Vitals:   08/05/15 0459 08/05/15 0848  BP: 143/52 153/86  Pulse: 57 52  Temp: 98 F (36.7 C) 97.9 F (36.6 C)  Resp: 18 18    General: NAD Cardiovascular: RRR Respiratory: CTAB  Discharge Instructions   Discharge Instructions    Diet - low sodium heart healthy    Complete by:  As directed      Discharge instructions    Complete by:  As directed   Follow up with ANDERSON,TERESA, FNP in 1-2 weeks.     Increase activity slowly    Complete by:  As directed           Current Discharge Medication List    CONTINUE these medications which have CHANGED   Details  gabapentin (NEURONTIN) 100 MG capsule Take 1 capsule (100 mg total) by mouth at bedtime. Qty: 30 capsule, Refills: 0    torsemide (DEMADEX) 20 MG tablet 40 mg in a.m. and 20 mg and p.m. Qty: 90 tablet, Refills: 0      CONTINUE these medications which have NOT CHANGED   Details  atorvastatin (LIPITOR) 20 MG tablet Take 20 mg by mouth at bedtime.    escitalopram (LEXAPRO) 10 MG tablet Take 10 mg by mouth daily.    fenofibrate 160 MG tablet Take 160 mg by mouth daily.    hydrALAZINE (APRESOLINE) 50 MG tablet Take 1 tablet (50 mg total) by mouth every 12 (twelve) hours. Qty: 60 tablet, Refills: 2    isosorbide mononitrate (IMDUR) 30 MG 24 hr tablet TAKE 1 TABLET BY MOUTH EVERY DAY Qty: 30 tablet, Refills: 6    methimazole (TAPAZOLE) 5 MG tablet Take 1 tablet (5 mg total) by mouth 3 (three) times a week. Qty: 30 tablet, Refills: 2    metoprolol tartrate (LOPRESSOR) 25 MG  tablet TAKE 1 TABLET BY MOUTH TWICE A DAY Qty: 60 tablet, Refills: 9    Multiple Vitamin (MULTIVITAMIN WITH MINERALS) TABS tablet Take 1 tablet by mouth daily.    OXYGEN Inhale 3 L into the lungs at bedtime.     potassium chloride SA (K-DUR,KLOR-CON) 20 MEQ tablet Take 1 tablet (20 mEq total) by mouth daily. Qty: 60 tablet, Refills: 2    warfarin (COUMADIN) 2.5 MG tablet Take 1 tablet (2.5 mg total) by mouth daily at 6 PM. Qty: 30 tablet, Refills: 2    albuterol (PROVENTIL HFA;VENTOLIN HFA) 108 (90 BASE) MCG/ACT inhaler Inhale 2 puffs into the lungs every 6 (six) hours as needed for wheezing or shortness of breath. Qty: 1 Inhaler, Refills: 3    cholecalciferol (VITAMIN D) 1000 UNITS tablet Take 1,000 Units by mouth every morning.       STOP taking these medications     aspirin EC 81 MG tablet        Allergies  Allergen Reactions  . Yellow Dyes (Non-Tartrazine) Nausea And Vomiting    "deathly allergic" No other information given to explain reaction. Per Daughter- this was an IV  dye. Ok with yellow coloring on Coumadin tablets.   . Codeine Hives  . Other Other (See Comments)    novocaine broke mouth out  . Penicillins Hives  . Procaine Hives   Follow-up Information    Follow up with Elizabeth Palau, FNP. Schedule an appointment as soon as possible for a visit in 1 week.   Specialty:  Nurse Practitioner   Why:  f/u in 1-2 weeks   Contact information:   [address] [place] [address] [phone]        The results of significant diagnostics from this hospitalization (including imaging, microbiology, ancillary and laboratory) are listed below for reference.    Significant Diagnostic Studies: Dg Chest Portable 1 View  08/02/2015  CLINICAL DATA:  Shortness of breath. EXAM: PORTABLE CHEST 1 VIEW COMPARISON:  07/02/2015. FINDINGS: Mediastinum hilar structures normal. Cardiomegaly with progressive bilateral pulmonary infiltrates consistent pulmonary  edema. Bilateral pneumonia could present this fashion. Small right pleural effusion. No pneumothorax. IMPRESSION: Cardiomegaly with progressive bilateral pulmonary infiltrates consistent with congestive heart failure with pulmonary edema. Bilateral pneumonia could also present and this fashion. Small right pleural effusion. Electronically Signed   By: Maisie Fus  Register   On: 08/02/2015 09:45    Microbiology: No results found for this or any previous visit (from the past 240 hour(s)).   Labs: Basic Metabolic Panel:  Recent Labs Lab 08/02/15 1023 08/03/15 0703 08/04/15 0503 08/05/15 0607  NA 145 146* 142 145  K 3.7 3.5 3.4* 3.7  CL 105 103 103 102  CO2 26 32 31 30  GLUCOSE 199* 113* 157* 154*  BUN 48* 52* 60* 68*  CREATININE 2.22* 2.19* 2.40* 2.46*  CALCIUM 9.7 9.2 8.9 9.1  MG  --  2.4 2.3  --    Liver Function Tests: No results for input(s): AST, ALT, ALKPHOS, BILITOT, PROT, ALBUMIN in the last 168 hours. No results for input(s): LIPASE, AMYLASE in the last 168 hours. No results for input(s): AMMONIA in the last 168 hours. CBC:  Recent Labs Lab 08/02/15 1023 08/03/15 0703 08/04/15 0503 08/05/15 0607  WBC 8.6 7.2 5.6 4.1  NEUTROABS 6.9  --   --   --   HGB 9.2* 8.6* 8.3* 8.1*  HCT 29.9* 27.2* 27.9* 27.2*  MCV 83.1 82.7 82.3 82.7  PLT 317 294 313 311   Cardiac Enzymes:  Recent Labs Lab 08/02/15 1817 08/02/15 2208  TROPONINI 0.05* 0.05*   BNP: BNP (last 3 results)  Recent Labs  06/11/15 1755 06/20/15 1630 08/02/15 1023  BNP 914.1* 1716.4* 1083.4*    ProBNP (last 3 results) No results for input(s): PROBNP in the last 8760 hours.  CBG:  Recent Labs Lab 08/04/15 1145 08/04/15 1643 08/04/15 2121 08/05/15 0803 08/05/15 1147  GLUCAP 233* 158* 228* 144* 158*       Signed:  Armoni Kludt MD.  [facility] 08/05/2015, 2:03 PM

## 2015-08-05 NOTE — Progress Notes (Signed)
ANTICOAGULATION CONSULT NOTE   Pharmacy Consult for Coumadin Indication: atrial fibrillation  Allergies  Allergen Reactions  . Yellow Dyes (Non-Tartrazine) Nausea And Vomiting    "deathly allergic" No other information given to explain reaction. Per Daughter- this was an IV dye. Ok with yellow coloring on Coumadin tablets.   . Codeine Hives  . Other Other (See Comments)    novocaine broke mouth out  . Penicillins Hives  . Procaine Hives    Patient Measurements: Weight: 136 lb 11 oz (62 kg)  Vital Signs: Temp: 98 F (36.7 C) (03/11 0459) BP: 143/52 mmHg (03/11 0459) Pulse Rate: 57 (03/11 0459)  Labs:  Recent Labs  08/02/15 1817 08/02/15 2208 08/03/15 0703 08/04/15 0503 08/05/15 0607  HGB  --   --  8.6* 8.3* 8.1*  HCT  --   --  27.2* 27.9* 27.2*  PLT  --   --  294 313 311  LABPROT 26.9*  --  29.1* 26.8* 25.7*  INR 2.52*  --  2.81* 2.51* 2.38*  CREATININE  --   --  2.19* 2.40* 2.46*  TROPONINI 0.05* 0.05*  --   --   --     Estimated Creatinine Clearance: 15.5 mL/min (by C-G formula based on Cr of 2.46).   Medical History: Past Medical History  Diagnosis Date  . Hypertension   . Neuropathy (HCC)   . Anxiety   . PONV (postoperative nausea and vomiting)   . CHF (congestive heart failure) (HCC)   . COPD (chronic obstructive pulmonary disease) (HCC)     on home, nocturnal oxygen.   . Family history of adverse reaction to anesthesia   . Cardiomyopathy (HCC)   . Diabetes mellitus without complication (HCC)     TYPE 2  . Macular degeneration, bilateral   . Varicose veins   . GIB (gastrointestinal bleeding)   . Paroxysmal atrial fibrillation (HCC)   . Shortness of breath dyspnea   . Acute on chronic diastolic heart failure (HCC) 12/22/2014  . Confusion state      Assessment: Alexis [name] admitted 08/02/2015 after syncopal episode. Pt on warf 2.5mg  daily PTA for AFib. INR on admit 2.52, today's INR is 2.38. Hgb 8.1, plts wnl. no bleeding noted  Goal of Therapy:   INR 2-3 Monitor platelets by anticoagulation protocol: Yes   Plan:  Give coumadin 2.5mg  PO x 1 tonight Monitor daily INR, CBC, s/s of bleed  Sandi Carne, PharmD Pharmacy Resident Pager: [phone]  08/05/2015 8:21 AM

## 2015-08-14 ENCOUNTER — Inpatient Hospital Stay (HOSPITAL_COMMUNITY): Admission: RE | Admit: 2015-08-14 | Payer: Medicare Other | Source: Ambulatory Visit

## 2015-08-16 ENCOUNTER — Ambulatory Visit: Payer: Medicare Other | Admitting: Internal Medicine

## 2015-08-18 ENCOUNTER — Encounter: Payer: Self-pay | Admitting: Internal Medicine

## 2015-08-18 ENCOUNTER — Ambulatory Visit (INDEPENDENT_AMBULATORY_CARE_PROVIDER_SITE_OTHER): Payer: Medicare Other | Admitting: Pharmacist Clinician (PhC)/ Clinical Pharmacy Specialist

## 2015-08-18 ENCOUNTER — Ambulatory Visit (INDEPENDENT_AMBULATORY_CARE_PROVIDER_SITE_OTHER): Payer: Medicare Other | Admitting: Internal Medicine

## 2015-08-18 VITALS — BP 132/44 | HR 58 | Ht 62.0 in | Wt 135.9 lb

## 2015-08-18 DIAGNOSIS — N184 Chronic kidney disease, stage 4 (severe): Secondary | ICD-10-CM

## 2015-08-18 DIAGNOSIS — I160 Hypertensive urgency: Secondary | ICD-10-CM

## 2015-08-18 DIAGNOSIS — I5031 Acute diastolic (congestive) heart failure: Secondary | ICD-10-CM | POA: Diagnosis not present

## 2015-08-18 DIAGNOSIS — I481 Persistent atrial fibrillation: Secondary | ICD-10-CM | POA: Diagnosis not present

## 2015-08-18 DIAGNOSIS — K921 Melena: Secondary | ICD-10-CM

## 2015-08-18 DIAGNOSIS — Z7901 Long term (current) use of anticoagulants: Secondary | ICD-10-CM

## 2015-08-18 DIAGNOSIS — I480 Paroxysmal atrial fibrillation: Secondary | ICD-10-CM

## 2015-08-18 LAB — POCT INR: INR: 2

## 2015-08-18 NOTE — Patient Instructions (Signed)
No changes with medication   Your physician recommends that you schedule a follow-up appointment in 3 months with Dr [name].  If you need a refill on your cardiac medications before your next appointment, please call your pharmacy.

## 2015-08-18 NOTE — Progress Notes (Signed)
OFFICE NOTE  Chief Complaint:  Hospital follow-up  Primary Care Physician: [name], PA-C  HPI:  Alexis [name] is a pleasant 82 year old female who was recently seen at any and for heart failure exacerbation. She presented at this couple of weeks of increasing shortness of breath, cough, increasing abdominal girth and lower extremity edema. On admission her BNP was elevated and she responded fairly quickly to diuresis. This caused improvement in her breathing. She was noted to be hypertensive and continues to be hypertensive today. She has had some acute on chronic renal insufficiency.  She reports that when she was discharged she was sent home on oxygen, but was never told why she needed it. She followed up with her primary care provider, who is with [facility] and he recommended that she see a cardiologist to help determine whether she needs to continue to take oxygen. She apparently sought out our group and on seeing her today.  Her past medical history is significant for hypertension, diabetes, dyslipidemia and family history of heart disease.  She reports that since discharge her breathing is better, however when she does marked exertion she does feel short of breath. She is not using her oxygen. She denies any chest pain.  An echocardiogram performed at [facility] showed an EF of 65-70%, with moderate concentric LVH, and no wall motion abnormalities.  She's never had a stress test evaluation.  Alexis [name] returns today for followup of her stress test. This is a nuclear stress test that showed no reversible ischemia and a preserved EF greater than 70%. She reports that she's been undergoing rehabilitation at home and feels that her shortness of breath is improving. It is now a point where she does not feel that she needs oxygen. We went ahead and ambulated her around the office today and her baseline SpO2 was 98% and decreased to 95% with exertion. This is markedly improved  from her previous office visit where her oxygen saturation dropped to 74% on room air.  Also at her last office visit I started her on amlodipine 10 mg daily and this helped both lower blood pressure and I suspect will ask her pulmonary artery pressures as well.  I saw Alexis [name] back today from her recent hospitalization. She was also due for annual follow-up. She was recently admitted for what sounds like a diastolic heart failure exacerbation, possibly precipitated by pneumonia and there was clear medication noncompliance. She was not taking her Lasix as prescribed. Also there may been some dietary noncompliance. She had about 6-8 pounds of weight gain which was diuresed down to 151 pounds on discharge. She is currently weighing 156 pounds at home and waited at 158 pounds in the office. She reports no significant shortness of breath and has been "religious" about taking her Lasix on a daily basis. She says she is eating more than she had before she is currently living with her daughter.  Alexis [name] was again hospitalized for diastolic heart failure/COPD. Again there is suspicion of possible noncompliance. She diuresed and was given antibiotics/steroids. Breathing is somewhat improved. She is currently on 3 L continuous oxygen. She has not been back to see her pulmonologist yet. She was placed on Lasix 60 mg daily after a pulse dose of Lasix 120 mg for 3 days. She is currently complaining of leg swelling and some shortness of breath with mild exertion.  I saw Alexis [name] again recently in the hospital this time in the setting of  acute GI bleeding. She was found to be anemic however a source of her bleeding was not identified. She did undergo EGD. She was also found to have new onset atrial fibrillation. Her CHADSVASC score is 6, therefore ultimately anticoagulation must be considered although at the time with her active bleeding it was not pursued. We talked at length today in the office about  anticoagulation and her stroke risk and she is deathly afraid of stroke, which is very reasonable given her risk. Given the possible options for anticoagulation, the safest option may be warfarin which could be easily reversed if recurrent bleeding is noted. She currently reports dark stools but is on twice daily iron.  Alexis [name] returns today for follow-up. She was recently hospitalized at [facility] and treated by the hospital medicine service for acute congestive heart failure/hypoxia related to COPD. A repeat echo in January 2017 showed normal LV systolic function with diastolic dysfunction and high LV filling pressure. She was diuresed and blood pressure was treated as she was noted to be markedly hypertensive on admission. At discharge her weight was 62 kg and she was transitioned to torsemide 40 mg every morning and 20 mg every afternoon. She does have stage IV chronic kidney disease which is been stable. She was taken off of aspirin but remains on warfarin for anticoagulation for her A. fib and CHADSVASC score of 6. She reports her weight has been stable. She denies any worsening shortness of breath. With a long discussion today about salt avoidance and it seems that there may be some dietary indiscretion as a cause of some of her recurrent heart failure exacerbations.  PMHx:  Past Medical History  Diagnosis Date  . Hypertension   . Neuropathy (HCC)   . Anxiety   . PONV (postoperative nausea and vomiting)   . CHF (congestive heart failure) (HCC)   . COPD (chronic obstructive pulmonary disease) (HCC)     on home, nocturnal oxygen.   . Family history of adverse reaction to anesthesia   . Cardiomyopathy (HCC)   . Diabetes mellitus without complication (HCC)     TYPE 2  . Macular degeneration, bilateral   . Varicose veins   . GIB (gastrointestinal bleeding)   . Paroxysmal atrial fibrillation (HCC)   . Shortness of breath dyspnea   . Acute on chronic diastolic heart failure (HCC) 12/22/2014    . Confusion state     Past Surgical History  Procedure Laterality Date  . Hip fracture surgery  2004  . Abdominal hysterectomy    . Vein surgery    . Esophagogastroduodenoscopy N/A 12/01/2014    Procedure: ESOPHAGOGASTRODUODENOSCOPY (EGD);  Surgeon: Hilarie Fredrickson, MD;  Location: [facility] ENDOSCOPY;  Service: Endoscopy;  Laterality: N/A;    FAMHx:  Family History  Problem Relation Age of Onset  . Colon cancer Father 63    advanced when discovered, no surgery or therapy.  this led to his death  . Breast cancer Sister   . Cancer Brother   . Breast cancer Mother   . Lung cancer Brother   . Thyroid disease Neg Hx     SOCHx:   reports that she has been passively smoking.  She has never used smokeless tobacco. She reports that she does not drink alcohol or use illicit drugs.  ALLERGIES:  Allergies  Allergen Reactions  . Yellow Dyes (Non-Tartrazine) Nausea And Vomiting    "deathly allergic" No other information given to explain reaction. Per Daughter- this was an IV dye. Ok with  yellow coloring on Coumadin tablets.   . Codeine Hives  . Other Other (See Comments)    novocaine broke mouth out  . Penicillins Hives  . Procaine Hives    ROS: Pertinent items noted in HPI and remainder of comprehensive ROS otherwise negative.  HOME MEDS: Current Outpatient Prescriptions  Medication Sig Dispense Refill  . albuterol (PROVENTIL HFA;VENTOLIN HFA) 108 (90 BASE) MCG/ACT inhaler Inhale 2 puffs into the lungs every 6 (six) hours as needed for wheezing or shortness of breath. 1 Inhaler 3  . atorvastatin (LIPITOR) 20 MG tablet Take 20 mg by mouth at bedtime.    . cholecalciferol (VITAMIN D) 1000 UNITS tablet Take 1,000 Units by mouth every morning.     . escitalopram (LEXAPRO) 10 MG tablet Take 10 mg by mouth daily.    . fenofibrate 160 MG tablet Take 160 mg by mouth daily.    Marland Kitchen gabapentin (NEURONTIN) 100 MG capsule Take 1 capsule (100 mg total) by mouth at bedtime. 30 capsule 0  . hydrALAZINE  (APRESOLINE) 50 MG tablet Take 1 tablet (50 mg total) by mouth every 12 (twelve) hours. 60 tablet 2  . isosorbide mononitrate (IMDUR) 30 MG 24 hr tablet TAKE 1 TABLET BY MOUTH EVERY DAY 30 tablet 6  . methimazole (TAPAZOLE) 5 MG tablet Take 1 tablet (5 mg total) by mouth 3 (three) times a week. 30 tablet 2  . metoprolol tartrate (LOPRESSOR) 25 MG tablet TAKE 1 TABLET BY MOUTH TWICE A DAY 60 tablet 9  . Multiple Vitamin (MULTIVITAMIN WITH MINERALS) TABS tablet Take 1 tablet by mouth daily.    . OXYGEN Inhale 3 L into the lungs at bedtime.     . potassium chloride SA (K-DUR,KLOR-CON) 20 MEQ tablet Take 1 tablet (20 mEq total) by mouth daily. 60 tablet 2  . torsemide (DEMADEX) 20 MG tablet 40 mg in a.m. and 20 mg and p.m. 90 tablet 0  . warfarin (COUMADIN) 2.5 MG tablet Take 1 tablet (2.5 mg total) by mouth daily at 6 PM. 30 tablet 2   No current facility-administered medications for this visit.    LABS/IMAGING: No results found for this or any previous visit (from the past 48 hour(s)). No results found.  VITALS: There were no vitals taken for this visit.  EXAM: General appearance: alert and no distress Neck: no carotid bruit and no JVD Lungs: diminished breath sounds bibasilar Heart: regular rate and rhythm, S1, S2 normal, no murmur, click, rub or gallop Abdomen: soft, non-tender; bowel sounds normal; no masses,  no organomegaly Extremities: extremities normal, atraumatic, no cyanosis or edema Pulses: 2+ and symmetric Skin: Skin color, texture, turgor normal. No rashes or lesions Neurologic: Grossly normal Psych: Mood, affect normal  EKG: Deferred  ASSESSMENT: 1. Persistent atrial fibrillation-CHADSVASC score of 6 on warfarin 2. Anemia  3. Recent acute diastolic heart failure exacerbation 4. Hypertensive heart disease with moderate LVH 5. Hypertension - controlled 6. Dyslipidemia 7. Diabetes type 2 8. Family history of heart disease 9. COPD on 3 L O2 continuous  PLAN: 1.    Alexis [name] was again admitted for acute diastolic congestive heart failure in the setting of hypertensive urgency. This may be related to worsening renal function. She is scheduled to see Dr. Hyman Hopes in the near future. Creatinine seems to be fairly stable though on her current dose of torsemide. We'll continue that and I'll plan to see her back shortly in about 3 months. We had a long discussion about salt avoidance and it seems  that she has a poor understanding of this. She was not on oxygen today however previously was on continuous oxygen. Her INR was assessed today and will be followed closely by Belenda Cruise.   Chrystie Nose, MD, [facility] Attending Cardiologist [facility]  Chrystie Nose 08/18/2015, 8:48 AM

## 2015-09-02 ENCOUNTER — Other Ambulatory Visit: Payer: Self-pay | Admitting: Internal Medicine

## 2015-09-04 NOTE — Telephone Encounter (Signed)
REFILL 

## 2015-09-08 ENCOUNTER — Ambulatory Visit (INDEPENDENT_AMBULATORY_CARE_PROVIDER_SITE_OTHER): Payer: Medicare Other | Admitting: Pharmacist Clinician (PhC)/ Clinical Pharmacy Specialist

## 2015-09-08 ENCOUNTER — Other Ambulatory Visit: Payer: Self-pay | Admitting: Internal Medicine

## 2015-09-08 ENCOUNTER — Telehealth: Payer: Self-pay | Admitting: *Deleted

## 2015-09-08 DIAGNOSIS — I4819 Other persistent atrial fibrillation: Secondary | ICD-10-CM

## 2015-09-08 DIAGNOSIS — I481 Persistent atrial fibrillation: Secondary | ICD-10-CM

## 2015-09-08 DIAGNOSIS — Z7901 Long term (current) use of anticoagulants: Secondary | ICD-10-CM | POA: Diagnosis not present

## 2015-09-08 DIAGNOSIS — I480 Paroxysmal atrial fibrillation: Secondary | ICD-10-CM | POA: Diagnosis not present

## 2015-09-08 LAB — PROTIME-INR
INR: 6.1 (ref <1.50)
Prothrombin Time: 55.7 seconds — ABNORMAL HIGH (ref 11.6–15.2)

## 2015-09-08 LAB — POCT INR: INR: 7.7

## 2015-09-08 NOTE — Telephone Encounter (Signed)
See anticoag note

## 2015-09-08 NOTE — Telephone Encounter (Signed)
Received call from [facility] labs  PT 55.7 INR 6.10  Will forward to Roney Marion D

## 2015-09-11 ENCOUNTER — Encounter: Payer: Self-pay | Admitting: *Deleted

## 2015-09-11 NOTE — Progress Notes (Signed)
Received office notes from [facility], reviewed by Dr. Pamelia Hoit, sent to scan.

## 2015-09-15 ENCOUNTER — Encounter: Payer: Medicare Other | Admitting: Pharmacist Clinician (PhC)/ Clinical Pharmacy Specialist

## 2015-09-18 ENCOUNTER — Emergency Department (HOSPITAL_COMMUNITY): Payer: Medicare Other

## 2015-09-18 ENCOUNTER — Ambulatory Visit (INDEPENDENT_AMBULATORY_CARE_PROVIDER_SITE_OTHER): Payer: Medicare Other | Admitting: Podiatry

## 2015-09-18 ENCOUNTER — Observation Stay (HOSPITAL_COMMUNITY)
Admission: EM | Admit: 2015-09-18 | Discharge: 2015-09-22 | Disposition: A | Discharge status: Home / Self Care | Payer: Medicare Other | Attending: Internal Medicine | Admitting: Internal Medicine

## 2015-09-18 ENCOUNTER — Encounter (HOSPITAL_COMMUNITY): Payer: Self-pay

## 2015-09-18 DIAGNOSIS — I482 Chronic atrial fibrillation: Secondary | ICD-10-CM | POA: Diagnosis not present

## 2015-09-18 DIAGNOSIS — Z66 Do not resuscitate: Secondary | ICD-10-CM | POA: Insufficient documentation

## 2015-09-18 DIAGNOSIS — Z7982 Long term (current) use of aspirin: Secondary | ICD-10-CM | POA: Insufficient documentation

## 2015-09-18 DIAGNOSIS — D649 Anemia, unspecified: Secondary | ICD-10-CM | POA: Diagnosis present

## 2015-09-18 DIAGNOSIS — L8990 Pressure ulcer of unspecified site, unspecified stage: Secondary | ICD-10-CM | POA: Diagnosis not present

## 2015-09-18 DIAGNOSIS — D631 Anemia in chronic kidney disease: Secondary | ICD-10-CM | POA: Diagnosis not present

## 2015-09-18 DIAGNOSIS — R0602 Shortness of breath: Secondary | ICD-10-CM | POA: Diagnosis present

## 2015-09-18 DIAGNOSIS — I2510 Atherosclerotic heart disease of native coronary artery without angina pectoris: Secondary | ICD-10-CM | POA: Diagnosis not present

## 2015-09-18 DIAGNOSIS — I132 Hypertensive heart and chronic kidney disease with heart failure and with stage 5 chronic kidney disease, or end stage renal disease: Secondary | ICD-10-CM | POA: Diagnosis not present

## 2015-09-18 DIAGNOSIS — F419 Anxiety disorder, unspecified: Secondary | ICD-10-CM | POA: Insufficient documentation

## 2015-09-18 DIAGNOSIS — Z880 Allergy status to penicillin: Secondary | ICD-10-CM | POA: Diagnosis not present

## 2015-09-18 DIAGNOSIS — M79674 Pain in right toe(s): Secondary | ICD-10-CM | POA: Diagnosis not present

## 2015-09-18 DIAGNOSIS — E119 Type 2 diabetes mellitus without complications: Secondary | ICD-10-CM

## 2015-09-18 DIAGNOSIS — E1122 Type 2 diabetes mellitus with diabetic chronic kidney disease: Secondary | ICD-10-CM | POA: Diagnosis not present

## 2015-09-18 DIAGNOSIS — B351 Tinea unguium: Secondary | ICD-10-CM

## 2015-09-18 DIAGNOSIS — I110 Hypertensive heart disease with heart failure: Secondary | ICD-10-CM | POA: Diagnosis present

## 2015-09-18 DIAGNOSIS — F17200 Nicotine dependence, unspecified, uncomplicated: Secondary | ICD-10-CM | POA: Insufficient documentation

## 2015-09-18 DIAGNOSIS — I5033 Acute on chronic diastolic (congestive) heart failure: Secondary | ICD-10-CM | POA: Insufficient documentation

## 2015-09-18 DIAGNOSIS — E039 Hypothyroidism, unspecified: Secondary | ICD-10-CM | POA: Insufficient documentation

## 2015-09-18 DIAGNOSIS — J410 Simple chronic bronchitis: Secondary | ICD-10-CM | POA: Insufficient documentation

## 2015-09-18 DIAGNOSIS — I480 Paroxysmal atrial fibrillation: Secondary | ICD-10-CM | POA: Insufficient documentation

## 2015-09-18 DIAGNOSIS — J449 Chronic obstructive pulmonary disease, unspecified: Secondary | ICD-10-CM | POA: Diagnosis not present

## 2015-09-18 DIAGNOSIS — E785 Hyperlipidemia, unspecified: Secondary | ICD-10-CM | POA: Diagnosis not present

## 2015-09-18 DIAGNOSIS — E1140 Type 2 diabetes mellitus with diabetic neuropathy, unspecified: Secondary | ICD-10-CM | POA: Insufficient documentation

## 2015-09-18 DIAGNOSIS — Z7901 Long term (current) use of anticoagulants: Secondary | ICD-10-CM | POA: Insufficient documentation

## 2015-09-18 DIAGNOSIS — M79675 Pain in left toe(s): Secondary | ICD-10-CM

## 2015-09-18 DIAGNOSIS — J9621 Acute and chronic respiratory failure with hypoxia: Secondary | ICD-10-CM | POA: Diagnosis not present

## 2015-09-18 DIAGNOSIS — I5032 Chronic diastolic (congestive) heart failure: Secondary | ICD-10-CM | POA: Diagnosis present

## 2015-09-18 DIAGNOSIS — N185 Chronic kidney disease, stage 5: Secondary | ICD-10-CM | POA: Diagnosis not present

## 2015-09-18 DIAGNOSIS — I4819 Other persistent atrial fibrillation: Secondary | ICD-10-CM | POA: Insufficient documentation

## 2015-09-18 DIAGNOSIS — I509 Heart failure, unspecified: Secondary | ICD-10-CM

## 2015-09-18 DIAGNOSIS — E118 Type 2 diabetes mellitus with unspecified complications: Secondary | ICD-10-CM | POA: Diagnosis not present

## 2015-09-18 HISTORY — DX: Hypertensive heart disease without heart failure: I11.9

## 2015-09-18 HISTORY — DX: Other specified abnormal findings of blood chemistry: R79.89

## 2015-09-18 HISTORY — DX: Type 2 diabetes mellitus without complications: E11.9

## 2015-09-18 HISTORY — DX: Chronic diastolic (congestive) heart failure: I50.32

## 2015-09-18 HISTORY — DX: Other specified abnormalities of plasma proteins: R77.8

## 2015-09-18 HISTORY — DX: Chronic kidney disease, stage 4 (severe): N18.4

## 2015-09-18 LAB — CBC WITH DIFFERENTIAL/PLATELET
BASOS ABS: 0 10*3/uL (ref 0.0–0.1)
BASOS PCT: 0 %
EOS ABS: 0.1 10*3/uL (ref 0.0–0.7)
EOS PCT: 1 %
HCT: 25.1 % — ABNORMAL LOW (ref 36.0–46.0)
HEMOGLOBIN: 7.5 g/dL — AB (ref 12.0–15.0)
Lymphocytes Relative: 7 %
Lymphs Abs: 0.6 10*3/uL — ABNORMAL LOW (ref 0.7–4.0)
MCH: 23.1 pg — AB (ref 26.0–34.0)
MCHC: 29.9 g/dL — ABNORMAL LOW (ref 30.0–36.0)
MCV: 77.5 fL — ABNORMAL LOW (ref 78.0–100.0)
Monocytes Absolute: 0.5 10*3/uL (ref 0.1–1.0)
Monocytes Relative: 6 %
NEUTROS PCT: 86 %
Neutro Abs: 8 10*3/uL — ABNORMAL HIGH (ref 1.7–7.7)
PLATELETS: 473 10*3/uL — AB (ref 150–400)
RBC: 3.24 MIL/uL — AB (ref 3.87–5.11)
RDW: 19.7 % — ABNORMAL HIGH (ref 11.5–15.5)
WBC: 9.3 10*3/uL (ref 4.0–10.5)

## 2015-09-18 LAB — BASIC METABOLIC PANEL
Anion gap: 14 (ref 5–15)
BUN: 45 mg/dL — ABNORMAL HIGH (ref 6–20)
CHLORIDE: 96 mmol/L — AB (ref 101–111)
CO2: 27 mmol/L (ref 22–32)
CREATININE: 2.63 mg/dL — AB (ref 0.44–1.00)
Calcium: 8.7 mg/dL — ABNORMAL LOW (ref 8.9–10.3)
GFR calc non Af Amer: 16 mL/min — ABNORMAL LOW (ref >60)
GFR, EST AFRICAN AMERICAN: 19 mL/min — AB (ref >60)
GLUCOSE: 355 mg/dL — AB (ref 65–99)
Potassium: 3.6 mmol/L (ref 3.5–5.1)
Sodium: 137 mmol/L (ref 135–145)

## 2015-09-18 LAB — BRAIN NATRIURETIC PEPTIDE: B Natriuretic Peptide: 802 pg/mL — ABNORMAL HIGH (ref 0.0–100.0)

## 2015-09-18 LAB — RETICULOCYTES
RBC.: 2.97 MIL/uL — AB (ref 3.87–5.11)
RETIC CT PCT: 2.1 % (ref 0.4–3.1)
Retic Count, Absolute: 62.4 10*3/uL (ref 19.0–186.0)

## 2015-09-18 LAB — IRON AND TIBC
Iron: 13 ug/dL — ABNORMAL LOW (ref 28–170)
SATURATION RATIOS: 4 % — AB (ref 10.4–31.8)
TIBC: 353 ug/dL (ref 250–450)
UIBC: 340 ug/dL

## 2015-09-18 LAB — I-STAT CHEM 8, ED
BUN: 43 mg/dL — AB (ref 6–20)
CREATININE: 2.4 mg/dL — AB (ref 0.44–1.00)
Calcium, Ion: 1.07 mmol/L — ABNORMAL LOW (ref 1.13–1.30)
Chloride: 95 mmol/L — ABNORMAL LOW (ref 101–111)
GLUCOSE: 351 mg/dL — AB (ref 65–99)
HEMATOCRIT: 25 % — AB (ref 36.0–46.0)
Hemoglobin: 8.5 g/dL — ABNORMAL LOW (ref 12.0–15.0)
Potassium: 3.6 mmol/L (ref 3.5–5.1)
Sodium: 137 mmol/L (ref 135–145)
TCO2: 30 mmol/L (ref 0–100)

## 2015-09-18 LAB — PROTIME-INR
INR: 2.77 — AB (ref 0.00–1.49)
Prothrombin Time: 28.9 seconds — ABNORMAL HIGH (ref 11.6–15.2)

## 2015-09-18 LAB — POC OCCULT BLOOD, ED: FECAL OCCULT BLD: NEGATIVE

## 2015-09-18 LAB — I-STAT TROPONIN, ED: TROPONIN I, POC: 0.01 ng/mL (ref 0.00–0.08)

## 2015-09-18 LAB — I-STAT BETA HCG BLOOD, ED (MC, WL, AP ONLY): I-stat hCG, quantitative: <5 m[IU]/mL (ref <5)

## 2015-09-18 LAB — FOLATE: FOLATE: 18.7 ng/mL (ref >5.9)

## 2015-09-18 LAB — GLUCOSE, CAPILLARY: Glucose-Capillary: 317 mg/dL — ABNORMAL HIGH (ref 65–99)

## 2015-09-18 LAB — VITAMIN B12: Vitamin B-12: 690 pg/mL (ref 180–914)

## 2015-09-18 LAB — FERRITIN: Ferritin: 44 ng/mL (ref 11–307)

## 2015-09-18 MED ORDER — INSULIN ASPART 100 UNIT/ML ~~LOC~~ SOLN
0.0000 [IU] | Freq: Three times a day (TID) | SUBCUTANEOUS | Status: DC
  Administered 2015-09-19: 5 [IU] via SUBCUTANEOUS
  Administered 2015-09-19: 2 [IU] via SUBCUTANEOUS
  Administered 2015-09-19: 3 [IU] via SUBCUTANEOUS
  Administered 2015-09-20: 2 [IU] via SUBCUTANEOUS

## 2015-09-18 MED ORDER — SODIUM CHLORIDE 0.9% FLUSH
3.0000 mL | Freq: Two times a day (BID) | INTRAVENOUS | Status: DC
  Administered 2015-09-18 – 2015-09-19 (×2): 3 mL via INTRAVENOUS

## 2015-09-18 MED ORDER — SODIUM CHLORIDE 0.9 % IV SOLN: 250.0000 mL | INTRAVENOUS | Status: DC | PRN

## 2015-09-18 MED ORDER — SODIUM CHLORIDE 0.9% FLUSH: 3.0000 mL | INTRAVENOUS | Status: DC | PRN

## 2015-09-18 MED ORDER — ONDANSETRON HCL 4 MG/2ML IJ SOLN: 4.0000 mg | Freq: Four times a day (QID) | INTRAMUSCULAR | Status: DC | PRN

## 2015-09-18 MED ORDER — HYDRALAZINE HCL 50 MG PO TABS
50.0000 mg | ORAL_TABLET | Freq: Two times a day (BID) | ORAL | Status: DC
  Administered 2015-09-19: 50 mg via ORAL
  Filled 2015-09-18: qty 1

## 2015-09-18 MED ORDER — GABAPENTIN 100 MG PO CAPS
100.0000 mg | ORAL_CAPSULE | Freq: Every day | ORAL | Status: DC
  Administered 2015-09-18 – 2015-09-21 (×4): 100 mg via ORAL
  Filled 2015-09-18 (×4): qty 1

## 2015-09-18 MED ORDER — ACETAMINOPHEN 325 MG PO TABS: 650.0000 mg | ORAL_TABLET | ORAL | Status: DC | PRN

## 2015-09-18 MED ORDER — ATORVASTATIN CALCIUM 20 MG PO TABS
20.0000 mg | ORAL_TABLET | Freq: Every day | ORAL | Status: DC
  Administered 2015-09-18 – 2015-09-21 (×4): 20 mg via ORAL
  Filled 2015-09-18 (×4): qty 1

## 2015-09-18 MED ORDER — FUROSEMIDE 10 MG/ML IJ SOLN
80.0000 mg | Freq: Two times a day (BID) | INTRAMUSCULAR | Status: DC
  Administered 2015-09-18 – 2015-09-19 (×2): 80 mg via INTRAVENOUS
  Filled 2015-09-18 (×2): qty 8

## 2015-09-18 MED ORDER — WARFARIN - PHYSICIAN DOSING INPATIENT: Freq: Every day | Status: DC

## 2015-09-18 MED ORDER — ASPIRIN 81 MG PO TABS: 81.0000 mg | ORAL_TABLET | Freq: Four times a day (QID) | ORAL | Status: DC | PRN

## 2015-09-18 MED ORDER — IPRATROPIUM-ALBUTEROL 0.5-2.5 (3) MG/3ML IN SOLN
3.0000 mL | Freq: Once | RESPIRATORY_TRACT | Status: AC
  Administered 2015-09-18: 3 mL via RESPIRATORY_TRACT
  Filled 2015-09-18: qty 3

## 2015-09-18 MED ORDER — ESCITALOPRAM OXALATE 10 MG PO TABS
10.0000 mg | ORAL_TABLET | Freq: Every day | ORAL | Status: DC
  Administered 2015-09-18 – 2015-09-22 (×5): 10 mg via ORAL
  Filled 2015-09-18 (×5): qty 1

## 2015-09-18 MED ORDER — METOPROLOL TARTRATE 25 MG PO TABS
25.0000 mg | ORAL_TABLET | Freq: Two times a day (BID) | ORAL | Status: DC
  Administered 2015-09-19 – 2015-09-21 (×6): 25 mg via ORAL
  Filled 2015-09-18 (×6): qty 1

## 2015-09-18 MED ORDER — POTASSIUM CHLORIDE CRYS ER 20 MEQ PO TBCR
20.0000 meq | EXTENDED_RELEASE_TABLET | Freq: Every day | ORAL | Status: DC
  Administered 2015-09-18 – 2015-09-22 (×5): 20 meq via ORAL
  Filled 2015-09-18 (×5): qty 1

## 2015-09-18 MED ORDER — WARFARIN SODIUM 2.5 MG PO TABS
2.5000 mg | ORAL_TABLET | Freq: Every day | ORAL | Status: DC
  Administered 2015-09-18: 2.5 mg via ORAL
  Filled 2015-09-18: qty 1

## 2015-09-18 NOTE — ED Notes (Signed)
MD Merrell at the bedside  

## 2015-09-18 NOTE — H&P (Signed)
History and Physical    Alexis [name] BDZ:[id number] DOB: 06/04/1931 DOA: 09/18/2015  Referring MD/NP/PA: EDP PCP: [name], PA-C  Outpatient Specialists: Patient Care Team: [name], PA-C as PCP - General (Physician Assistant) Patient coming from:  Home  Chief Complaint: Shortness of Breath  HPI: Alexis [name] is a 82 y.o. female with medical history significant of COPD, diabetes, atrial fibrillation, hypertension, CHF, coming to the emergency department with chief complaint of shortness of breathpresent for the past 3 days, without productive cough. She denies any fever, chills night sweats. She denies any chest pain or abdominal pain. She denies any recent trips or sick contacts. Denies fevers, chills, night sweats, vision changes, or mucositis. Denies any respiratory complaints. Denies any chest pain or palpitations.denies any nausea vomiting or diarrhea. Denies any dysuria. Reports  recent sacral wound that is painful, has not been treated., or neuropathy. Denies any bleeding issues such as epistaxis, hematemesis, hematuria or hematochezia. Ambulating without difficulty.   ED Course:  BP 125/66 mmHg  Pulse 97  Temp(Src) 99 F (37.2 C) (Rectal)  Resp 22  SpO2 100% she received nebulizer treatment with improvement of her symptoms. white count is normal at 9.3. Hemoglobin 7.5  (8.1 on 3/11)  with MCV of 77.5. Hemoccult-negative.Glucose 355. Creatinine 2.63. Troponin negative at 0.01. Chest x-ray was consistent with continuous CHF pattern with small pleural effusions bilaterally, with slight improvement since prior study. EKG shows atrial fibrillation, initially with rapid ventricular response, now better controlled, QTc 472. INR 2.77, she will be admitted for observation telemetry for the management of symptoms.    Review of Systems: As per HPI otherwise 10 point review of systems negative.   Past Medical History  Diagnosis Date  . Hypertension   . Neuropathy (HCC)    . Anxiety   . PONV (postoperative nausea and vomiting)   . CHF (congestive heart failure) (HCC)   . COPD (chronic obstructive pulmonary disease) (HCC)     on home, nocturnal oxygen.   . Family history of adverse reaction to anesthesia   . Cardiomyopathy (HCC)   . Diabetes mellitus without complication (HCC)     TYPE 2  . Macular degeneration, bilateral   . Varicose veins   . GIB (gastrointestinal bleeding)   . Paroxysmal atrial fibrillation (HCC)   . Shortness of breath dyspnea   . Acute on chronic diastolic heart failure (HCC) 12/22/2014  . Confusion state     Past Surgical History  Procedure Laterality Date  . Hip fracture surgery  2004  . Abdominal hysterectomy    . Vein surgery    . Esophagogastroduodenoscopy N/A 12/01/2014    Procedure: ESOPHAGOGASTRODUODENOSCOPY (EGD);  Surgeon: Hilarie Fredrickson, MD;  [facility]: [facility] ENDOSCOPY;  Service: Endoscopy;  Laterality: N/A;     reports that she has been passively smoking.  She has never used smokeless tobacco. She reports that she does not drink alcohol or use illicit drugs.  Allergies  Allergen Reactions  . Yellow Dyes (Non-Tartrazine) Nausea And Vomiting    "deathly allergic" No other information given to explain reaction. Per Daughter- this was an IV dye. Ok with yellow coloring on Coumadin tablets.   . Codeine Hives  . Other Other (See Comments)    novocaine broke mouth out  . Penicillins Hives  . Procaine Hives    Family History  Problem Relation Age of Onset  . Colon cancer Father 52    advanced when discovered, no surgery or therapy.  this led to his  death  . Breast cancer Sister   . Cancer Brother   . Breast cancer Mother   . Lung cancer Brother   . Thyroid disease Neg Hx     Family history reviewed and not pertinent (If you reviewed it)  Prior to Admission medications   Medication Sig Start Date End Date Taking? Authorizing Provider  aspirin 81 MG tablet Take 81 mg by mouth every 6 (six) hours as needed for pain.     Yes Historical Provider, MD  atorvastatin (LIPITOR) 20 MG tablet Take 20 mg by mouth at bedtime. 10/12/14 10/12/15 Yes Historical Provider, MD  cholecalciferol (VITAMIN D) 1000 UNITS tablet Take 1,000 Units by mouth every morning.    Yes Historical Provider, MD  escitalopram (LEXAPRO) 10 MG tablet Take 10 mg by mouth daily.   Yes Historical Provider, MD  fenofibrate 160 MG tablet Take 160 mg by mouth daily.   Yes Historical Provider, MD  gabapentin (NEURONTIN) 100 MG capsule Take 1 capsule (100 mg total) by mouth at bedtime. 08/05/15  Yes Rodolph Bong, MD  glipiZIDE (GLUCOTROL) 5 MG tablet Take 5 mg by mouth daily before breakfast.   Yes Historical Provider, MD  hydrALAZINE (APRESOLINE) 50 MG tablet Take 1 tablet (50 mg total) by mouth every 12 (twelve) hours. 06/23/15  Yes Meredeth Ide, MD  isosorbide mononitrate (IMDUR) 30 MG 24 hr tablet TAKE 1 TABLET BY MOUTH EVERY DAY 01/31/15  Yes Chrystie Nose, MD  methimazole (TAPAZOLE) 5 MG tablet Take 1 tablet (5 mg total) by mouth 3 (three) times a week. Patient taking differently: Take 5 mg by mouth every Monday, Wednesday, and Friday.  01/23/15  Yes Romero Belling, MD  metoprolol tartrate (LOPRESSOR) 25 MG tablet TAKE 1 TABLET BY MOUTH TWICE A DAY 02/27/15  Yes Chrystie Nose, MD  Multiple Vitamin (MULTIVITAMIN WITH MINERALS) TABS tablet Take 1 tablet by mouth daily.   Yes Historical Provider, MD  OXYGEN Inhale 3 L into the lungs at bedtime.    Yes Historical Provider, MD  potassium chloride SA (K-DUR,KLOR-CON) 20 MEQ tablet Take 1 tablet (20 mEq total) by mouth daily. 12/25/14  Yes Lora Paula, MD  torsemide (DEMADEX) 20 MG tablet TAKE 2 TABLETS EVERY MORNING AND 1 TABLET EVERY EVENING Patient taking differently: TAKE ONE TABLET BY  MOUTH TWICE DAILY 09/04/15  Yes Chrystie Nose, MD  warfarin (COUMADIN) 2.5 MG tablet Take 1 tablet (2.5 mg total) by mouth daily at 6 PM. 06/23/15  Yes Meredeth Ide, MD  albuterol (PROVENTIL HFA;VENTOLIN HFA) 108 (90  BASE) MCG/ACT inhaler Inhale 2 puffs into the lungs every 6 (six) hours as needed for wheezing or shortness of breath. 06/11/13   Nishant Dhungel, MD    Physical Exam:    Filed Vitals:   09/18/15 1512 09/18/15 1530 09/18/15 1600 09/18/15 1615  BP:  137/62 139/63 125/66  Pulse:  107 102 97  Temp:      TempSrc:      Resp:  22    SpO2: 97% 96% 99% 100%      Constitutional: NAD, calm, comfortable Filed Vitals:   09/18/15 1512 09/18/15 1530 09/18/15 1600 09/18/15 1615  BP:  137/62 139/63 125/66  Pulse:  107 102 97  Temp:      TempSrc:      Resp:  22    SpO2: 97% 96% 99% 100%   Eyes: PERRL, lids and conjunctivae normal ENMT: Mucous membranes are moist. Posterior pharynx clear of any exudate  or lesions.Normal dentition.  Neck: normal, supple, no masses, no thyromegaly Respiratory:bibasilar crackles, no wheezing, no crackles. Normal respiratory effort. No accessory muscle use.  Cardiovascular: IRR and rhythm, no murmurs / rubs / gallops. No extremity edema. 2+ pedal pulses. No carotid bruits.  Abdomen: no tenderness, no masses palpated. No hepatosplenomegaly. Bowel sounds positive.  Musculoskeletal: no clubbing / cyanosis. No joint deformity upper and lower extremities. Good ROM, no contractures. Normal muscle tone.  Skin: Stage 1 decubitus sacral ulcer with no drainage or significant erythema. No induration Neurologic: CN 2-12 grossly intact. Sensation intact, DTR normal. Strength 5/5 in all 4.  Psychiatric: Normal judgment and insight. Alert and oriented x 3. Normal mood.     Labs on Admission: I have personally reviewed following labs and imaging studies  CBC:  Recent Labs Lab 09/18/15 1220 09/18/15 1602  WBC 9.3  --   NEUTROABS 8.0*  --   HGB 7.5* 8.5*  HCT 25.1* 25.0*  MCV 77.5*  --   PLT 473*  --     Basic Metabolic Panel:  Recent Labs Lab 09/18/15 1533 09/18/15 1602  NA 137 137  K 3.6 3.6  CL 96* 95*  CO2 27  --   GLUCOSE 355* 351*  BUN 45* 43*    CREATININE 2.63* 2.40*  CALCIUM 8.7*  --     GFR: CrCl cannot be calculated (Unknown ideal weight.).  Liver Function Tests: No results for input(s): AST, ALT, ALKPHOS, BILITOT, PROT, ALBUMIN in the last 168 hours. No results for input(s): LIPASE, AMYLASE in the last 168 hours. No results for input(s): AMMONIA in the last 168 hours.  Coagulation Profile:  Recent Labs Lab 09/18/15 1220  INR 2.77*    Anemia Panel: No results for input(s): VITAMINB12, FOLATE, FERRITIN, TIBC, IRON, RETICCTPCT in the last 72 hours.  Urine analysis:    Component Value Date/Time   COLORURINE YELLOW 06/30/2015 1058   APPEARANCEUR TURBID* 06/30/2015 1058   LABSPEC 1.015 06/30/2015 1058   PHURINE 5.0 06/30/2015 1058   GLUCOSEU NEGATIVE 06/30/2015 1058   HGBUR SMALL* 06/30/2015 1058   BILIRUBINUR NEGATIVE 06/30/2015 1058   KETONESUR NEGATIVE 06/30/2015 1058   PROTEINUR 100* 06/30/2015 1058   UROBILINOGEN 0.2 12/14/2014 1821   NITRITE NEGATIVE 06/30/2015 1058   LEUKOCYTESUR LARGE* 06/30/2015 1058    Sepsis Labs: @LABRCNTIP (procalcitonin:4,lacticidven:4) )No results found for this or any previous visit (from the past 240 hour(s)).   Radiological Exams on Admission: Dg Chest 2 View  09/18/2015  CLINICAL DATA:  Worsening shortness of breath with cough. Green sputum for 3 days. EXAM: CHEST  2 VIEW COMPARISON:  08/02/2015 FINDINGS: Cardiomegaly with vascular congestion and diffuse interstitial and alveolar opacities most compatible with edema/CHF. Small bilateral effusions. Slight improvement in the edema since prior study. No acute bony abnormality. IMPRESSION: Continued CHF pattern with small bilateral pleural effusions. Slight improvement since prior study. Electronically Signed   By: Charlett Nose M.D.   On: 09/18/2015 13:41    EKG: Independently reviewed.  Assessment/Plan Principal Problem:   CHF exacerbation (HCC) Active Problems:   Chronic diastolic heart failure, NYHA class 1 (HCC)    Normocytic anemia   Pressure ulcer   Shortness of breath   Anemia   Diabetes (HCC)   Acute respiratory distress due to  Diastolic CHF decompensation with underlying COPD. 2 D echo 05/2015 Systolic function was normal. EF 55% to 60%. BNP 802, improved from 08/02/15 (1083) Chest x-ray was consistent with continuous CHF pattern with small pleural effusions bilaterally, with slight  improvement since prior study Rales on exam.   Diurese with Lasix 80 mg bid IV Obtain daily weights CXR in am  Prn nebs Monitor intake and output   Symptomatic anemia. Acute on chronic including h/o CHF. Hemoglobin 7.5 on admission. (8.1 on 3/11)  with MCV of 77.5. Hemoccult-negative.   Check anemia panel  Repeat CBC in am Type and screen. Will hold transfusion for now as she is close to her baseline Hb   Hypertension BP 125/66 mmHg  Pulse 97  Temp(Src) 99 F (37.2 C) (Rectal)  Resp 22  SpO2 100% Controlled Continue home anti-hypertensive medications.    May Add Hydralazine Q6 hours as needed for SBP >160 and /or DBP >110.   Chronic Atrial Fibrillation Rate controlled On Coumadin  for anticoagulation therapy. 2D Echo EF %05/2015 Systolic function was normal. EF 55% to 60%. BNP 802, improved from 08/02/15 (1083)  Pharmacy to dose Coumadin Continue home meds   Type II Diabetes Home regimen includes Current blood sugar level is  Lab Results  Component Value Date   HGBA1C 6.3* 06/21/2015  Hold home oral diabetic medications.  SSI Heart healthy carb modified diet.  Sacral Wound with mild bleeding. This is chronic  Wound consult requested  DVT prophylaxis: Coumadin  Code Status:   Full  Family Communication:  Discussed with son Disposition Plan: Expect patient to be discharged to home Consults called:    None Admission status  Obs Tele    [facility] EPA-C [facility]   If 7PM-7AM, please contact night-coverage [email or web address] Password [facility]  09/18/2015, 6:12 PM

## 2015-09-18 NOTE — ED Notes (Addendum)
Per EMS - pt from home. Pt c/o worsening dyspnea w/ cough, green sputum x 3 days. Pt switched from inhaler to neb tx by PCP. Pt used 2.5 albuterol prior to EMS arrival. Pt received additional .5 atrovent and 5 albuterol, 125mg  solumedrol. Pt 90% on RA. Hx afib.

## 2015-09-18 NOTE — ED Provider Notes (Signed)
CSN: [id number]     Arrival date & time 09/18/15  1208 History   First MD Initiated Contact with Patient 09/18/15 1225     No chief complaint on file.    (Consider location/radiation/quality/duration/timing/severity/associated sxs/prior Treatment) HPI Comments: Patient with past medical history of COPD, diabetes, A. fib, hypertension, CHF, presents to the emergency department with chief complaint of shortness of breath. She states that she has felt out of breath for the past 3 days. She reports associated productive cough. It is productive of green sputum. She denies any fevers or chills. She denies any chest pain or abdominal pain. There are no modifying factors.  Additionally, she states that she has a bedsore on her bottom. She states that this is painful.  The history is provided by the patient. No language interpreter was used.    Past Medical History  Diagnosis Date  . Hypertension   . Neuropathy (HCC)   . Anxiety   . PONV (postoperative nausea and vomiting)   . CHF (congestive heart failure) (HCC)   . COPD (chronic obstructive pulmonary disease) (HCC)     on home, nocturnal oxygen.   . Family history of adverse reaction to anesthesia   . Cardiomyopathy (HCC)   . Diabetes mellitus without complication (HCC)     TYPE 2  . Macular degeneration, bilateral   . Varicose veins   . GIB (gastrointestinal bleeding)   . Paroxysmal atrial fibrillation (HCC)   . Shortness of breath dyspnea   . Acute on chronic diastolic heart failure (HCC) 12/22/2014  . Confusion state    Past Surgical History  Procedure Laterality Date  . Hip fracture surgery  2004  . Abdominal hysterectomy    . Vein surgery    . Esophagogastroduodenoscopy N/A 12/01/2014    Procedure: ESOPHAGOGASTRODUODENOSCOPY (EGD);  Surgeon: Hilarie Fredrickson, MD;  Location: [facility] ENDOSCOPY;  Service: Endoscopy;  Laterality: N/A;   Family History  Problem Relation Age of Onset  . Colon cancer Father 57    advanced when discovered,  no surgery or therapy.  this led to his death  . Breast cancer Sister   . Cancer Brother   . Breast cancer Mother   . Lung cancer Brother   . Thyroid disease Neg Hx    Social History  Substance Use Topics  . Smoking status: Passive Smoke Exposure - Never Smoker  . Smokeless tobacco: Never Used  . Alcohol Use: No   OB History    No data available     Review of Systems  Constitutional: Negative for fever and chills.  Respiratory: Positive for cough and shortness of breath.   Cardiovascular: Negative for chest pain.  Gastrointestinal: Negative for nausea, vomiting, diarrhea and constipation.  Genitourinary: Negative for dysuria.  Skin: Positive for wound.      Allergies  Yellow dyes (non-tartrazine); Codeine; Other; Penicillins; and Procaine  Home Medications   Prior to Admission medications   Medication Sig Start Date End Date Taking? Authorizing Provider  albuterol (PROVENTIL HFA;VENTOLIN HFA) 108 (90 BASE) MCG/ACT inhaler Inhale 2 puffs into the lungs every 6 (six) hours as needed for wheezing or shortness of breath. 06/11/13   Nishant Dhungel, MD  aspirin 81 MG tablet Take 81 mg by mouth daily.    Historical Provider, MD  atorvastatin (LIPITOR) 20 MG tablet Take 20 mg by mouth at bedtime. 10/12/14 10/12/15  Historical Provider, MD  cholecalciferol (VITAMIN D) 1000 UNITS tablet Take 1,000 Units by mouth every morning.     Historical  Provider, MD  docusate sodium (COLACE) 100 MG capsule Take 100 mg by mouth 2 (two) times daily.    Historical Provider, MD  escitalopram (LEXAPRO) 10 MG tablet Take 10 mg by mouth daily.    Historical Provider, MD  fenofibrate 160 MG tablet Take 160 mg by mouth daily.    Historical Provider, MD  gabapentin (NEURONTIN) 100 MG capsule Take 1 capsule (100 mg total) by mouth at bedtime. 08/05/15   Rodolph Bong, MD  hydrALAZINE (APRESOLINE) 50 MG tablet Take 1 tablet (50 mg total) by mouth every 12 (twelve) hours. 06/23/15   Meredeth Ide, MD   isosorbide mononitrate (IMDUR) 30 MG 24 hr tablet TAKE 1 TABLET BY MOUTH EVERY DAY 01/31/15   Chrystie Nose, MD  methimazole (TAPAZOLE) 5 MG tablet Take 1 tablet (5 mg total) by mouth 3 (three) times a week. 01/23/15   Romero Belling, MD  metoprolol tartrate (LOPRESSOR) 25 MG tablet TAKE 1 TABLET BY MOUTH TWICE A DAY 02/27/15   Chrystie Nose, MD  Multiple Vitamin (MULTIVITAMIN WITH MINERALS) TABS tablet Take 1 tablet by mouth daily.    Historical Provider, MD  OXYGEN Inhale 3 L into the lungs at bedtime.     Historical Provider, MD  polyethylene glycol (MIRALAX / GLYCOLAX) packet Take 17 g by mouth daily as needed for mild constipation.    Historical Provider, MD  potassium chloride SA (K-DUR,KLOR-CON) 20 MEQ tablet Take 1 tablet (20 mEq total) by mouth daily. 12/25/14   Lora Paula, MD  torsemide (DEMADEX) 20 MG tablet TAKE 2 TABLETS EVERY MORNING AND 1 TABLET EVERY EVENING 09/04/15   Chrystie Nose, MD  warfarin (COUMADIN) 2.5 MG tablet Take 1 tablet (2.5 mg total) by mouth daily at 6 PM. 06/23/15   Meredeth Ide, MD   BP 127/60 mmHg  Pulse 112  Temp(Src) 99.5 F (37.5 C) (Oral)  Resp 19  SpO2 95% Physical Exam  Constitutional: She is oriented to person, place, and time. She appears well-developed and well-nourished.  HENT:  Head: Normocephalic and atraumatic.  Eyes: Conjunctivae and EOM are normal. Pupils are equal, round, and reactive to light.  Neck: Normal range of motion. Neck supple.  Cardiovascular: Regular rhythm.  Exam reveals no gallop and no friction rub.   No murmur heard. Mildly tachycardic Irregularly irregular  Pulmonary/Chest: Effort normal. No respiratory distress. She has no wheezes. She has rales. She exhibits no tenderness.  Rales in bilateral bases  Abdominal: Soft. Bowel sounds are normal. She exhibits no distension and no mass. There is no tenderness. There is no rebound and no guarding.  Musculoskeletal: Normal range of motion. She exhibits no edema or  tenderness.  Neurological: She is alert and oriented to person, place, and time.  Skin: Skin is warm and dry.  Stage I decubitus ulcer of sacrum, mild surrounding erythema, no discharge  Psychiatric: She has a normal mood and affect. Her behavior is normal. Judgment and thought content normal.  Nursing note and vitals reviewed.   ED Course  Procedures (including critical care time) Results for orders placed or performed during the hospital encounter of 09/18/15  CBC with Differential/Platelet  Result Value Ref Range   WBC 9.3 4.0 - 10.5 K/uL   RBC 3.24 (L) 3.87 - 5.11 MIL/uL   Hemoglobin 7.5 (L) 12.0 - 15.0 g/dL   HCT 40.9 (L) 81.1 - 91.4 %   MCV 77.5 (L) 78.0 - 100.0 fL   MCH 23.1 (L) 26.0 - 34.0  pg   MCHC 29.9 (L) 30.0 - 36.0 g/dL   RDW 16.1 (H) 09.6 - 04.5 %   Platelets 473 (H) 150 - 400 K/uL   Neutrophils Relative % 86 %   Neutro Abs 8.0 (H) 1.7 - 7.7 K/uL   Lymphocytes Relative 7 %   Lymphs Abs 0.6 (L) 0.7 - 4.0 K/uL   Monocytes Relative 6 %   Monocytes Absolute 0.5 0.1 - 1.0 K/uL   Eosinophils Relative 1 %   Eosinophils Absolute 0.1 0.0 - 0.7 K/uL   Basophils Relative 0 %   Basophils Absolute 0.0 0.0 - 0.1 K/uL  Protime-INR  Result Value Ref Range   Prothrombin Time 28.9 (H) 11.6 - 15.2 seconds   INR 2.77 (H) 0.00 - 1.49  Basic metabolic panel  Result Value Ref Range   Sodium 137 135 - 145 mmol/L   Potassium 3.6 3.5 - 5.1 mmol/L   Chloride 96 (L) 101 - 111 mmol/L   CO2 27 22 - 32 mmol/L   Glucose, Bld 355 (H) 65 - 99 mg/dL   BUN 45 (H) 6 - 20 mg/dL   Creatinine, Ser 4.09 (H) 0.44 - 1.00 mg/dL   Calcium 8.7 (L) 8.9 - 10.3 mg/dL   GFR calc non Af Amer 16 (L) >60 mL/min   GFR calc Af Amer 19 (L) >60 mL/min   Anion gap 14 5 - 15  I-stat troponin, ED  Result Value Ref Range   Troponin i, poc 0.01 0.00 - 0.08 ng/mL   Comment 3          POC occult blood, ED Provider will collect  Result Value Ref Range   Fecal Occult Bld NEGATIVE NEGATIVE  I-stat chem 8, ed   Result Value Ref Range   Sodium 137 135 - 145 mmol/L   Potassium 3.6 3.5 - 5.1 mmol/L   Chloride 95 (L) 101 - 111 mmol/L   BUN 43 (H) 6 - 20 mg/dL   Creatinine, Ser 8.11 (H) 0.44 - 1.00 mg/dL   Glucose, Bld 914 (H) 65 - 99 mg/dL   Calcium, Ion 7.82 (L) 1.13 - 1.30 mmol/L   TCO2 30 0 - 100 mmol/L   Hemoglobin 8.5 (L) 12.0 - 15.0 g/dL   HCT 95.6 (L) 21.3 - 08.6 %  I-Stat beta hCG blood, ED ([facility], [facility], [facility] only)  Result Value Ref Range   I-stat hCG, quantitative <5.0 <5 mIU/mL   Comment 3           Dg Chest 2 View  09/18/2015  CLINICAL DATA:  Worsening shortness of breath with cough. Green sputum for 3 days. EXAM: CHEST  2 VIEW COMPARISON:  08/02/2015 FINDINGS: Cardiomegaly with vascular congestion and diffuse interstitial and alveolar opacities most compatible with edema/CHF. Small bilateral effusions. Slight improvement in the edema since prior study. No acute bony abnormality. IMPRESSION: Continued CHF pattern with small bilateral pleural effusions. Slight improvement since prior study. Electronically Signed   By: Charlett Nose M.D.   On: 09/18/2015 13:41    I have personally reviewed and evaluated these images and lab results as part of my medical decision-making.   EKG Interpretation   Date/Time:  Monday September 18 2015 12:15:45 EDT Ventricular Rate:  115 PR Interval:  103 QRS Duration: 80 QT Interval:  341 QTC Calculation: 472 R Axis:   46 Text Interpretation:  Atrial fibrillation with rapid ventricular response  Premature ventricular complexes Confirmed by Juleen China  MD, STEPHEN ([id number]) on  09/18/2015 3:04:44 PM  MDM   Final diagnoses:  Shortness of breath  Anemia, unspecified anemia type  Congestive heart failure, unspecified congestive heart failure chronicity, unspecified congestive heart failure type [facility])    Patient with shortness of breath. History of COPD and productive cough. Will check chest x-ray, and labs. Will reassess. Patient also noted to have a stage I  decubitus ulcer on sacrum.  Chest x-ray shows continued CHF pattern with small bilateral pleural effusions. No evidence of pneumonia. BNP pending.  She states that she feels improved after neb, but does not feel well enough to go home. Additionally, she is noted to have a hemoglobin of 7.5, which appears to be trending downward.  PT/INR is 2.77, which is therapeutic for her, however it was 6.1 10 days ago. She states that she has had some dark stools, however Hemoccult test is negative today. There was no gross blood on exam, but there was firm brown stool.  Patient seen by and discussed with Dr. Juleen China, who recommends admission for SOB and probable CHF exacerbation.  No lower extremity swelling, but CXR is is consistent with CHF.  Patient does not feel well enough to go home.  She is followed by [facility], and will be and unassigned admission.     Roxy Horseman, PA-C 09/18/15 1624  Raeford Razor, MD 09/24/15 2132

## 2015-09-18 NOTE — ED Notes (Signed)
Attempted to call report

## 2015-09-19 ENCOUNTER — Observation Stay (HOSPITAL_COMMUNITY): Payer: Medicare Other

## 2015-09-19 DIAGNOSIS — J9621 Acute and chronic respiratory failure with hypoxia: Secondary | ICD-10-CM | POA: Diagnosis not present

## 2015-09-19 DIAGNOSIS — Z880 Allergy status to penicillin: Secondary | ICD-10-CM | POA: Diagnosis not present

## 2015-09-19 DIAGNOSIS — I132 Hypertensive heart and chronic kidney disease with heart failure and with stage 5 chronic kidney disease, or end stage renal disease: Secondary | ICD-10-CM | POA: Diagnosis not present

## 2015-09-19 DIAGNOSIS — I5032 Chronic diastolic (congestive) heart failure: Secondary | ICD-10-CM | POA: Diagnosis not present

## 2015-09-19 DIAGNOSIS — I480 Paroxysmal atrial fibrillation: Secondary | ICD-10-CM | POA: Diagnosis not present

## 2015-09-19 DIAGNOSIS — J410 Simple chronic bronchitis: Secondary | ICD-10-CM | POA: Insufficient documentation

## 2015-09-19 LAB — PROTIME-INR
INR: 4.22 — ABNORMAL HIGH (ref 0.00–1.49)
Prothrombin Time: 39.5 seconds — ABNORMAL HIGH (ref 11.6–15.2)

## 2015-09-19 LAB — BASIC METABOLIC PANEL
Anion gap: 11 (ref 5–15)
BUN: 45 mg/dL — AB (ref 6–20)
CALCIUM: 8.8 mg/dL — AB (ref 8.9–10.3)
CHLORIDE: 96 mmol/L — AB (ref 101–111)
CO2: 30 mmol/L (ref 22–32)
CREATININE: 2.55 mg/dL — AB (ref 0.44–1.00)
GFR calc Af Amer: 19 mL/min — ABNORMAL LOW (ref >60)
GFR, EST NON AFRICAN AMERICAN: 17 mL/min — AB (ref >60)
GLUCOSE: 337 mg/dL — AB (ref 65–99)
Potassium: 3.6 mmol/L (ref 3.5–5.1)
SODIUM: 137 mmol/L (ref 135–145)

## 2015-09-19 LAB — GLUCOSE, CAPILLARY
GLUCOSE-CAPILLARY: 216 mg/dL — AB (ref 65–99)
Glucose-Capillary: 268 mg/dL — ABNORMAL HIGH (ref 65–99)
Glucose-Capillary: 155 mg/dL — ABNORMAL HIGH (ref 65–99)
Glucose-Capillary: 286 mg/dL — ABNORMAL HIGH (ref 65–99)

## 2015-09-19 MED ORDER — HYDRALAZINE HCL 25 MG PO TABS
25.0000 mg | ORAL_TABLET | Freq: Two times a day (BID) | ORAL | Status: DC
  Administered 2015-09-20 – 2015-09-22 (×5): 25 mg via ORAL
  Filled 2015-09-19 (×6): qty 1

## 2015-09-19 MED ORDER — ISOSORBIDE MONONITRATE ER 30 MG PO TB24
30.0000 mg | ORAL_TABLET | Freq: Every day | ORAL | Status: DC
  Administered 2015-09-19 – 2015-09-22 (×4): 30 mg via ORAL
  Filled 2015-09-19 (×5): qty 1

## 2015-09-19 MED ORDER — FUROSEMIDE 80 MG PO TABS
80.0000 mg | ORAL_TABLET | Freq: Two times a day (BID) | ORAL | Status: DC
  Administered 2015-09-20 – 2015-09-21 (×4): 80 mg via ORAL
  Filled 2015-09-19 (×5): qty 1

## 2015-09-19 MED ORDER — INSULIN GLARGINE 100 UNIT/ML ~~LOC~~ SOLN: 12.0000 [IU] | Freq: Every day | SUBCUTANEOUS | Status: DC

## 2015-09-19 MED ORDER — METHIMAZOLE 10 MG PO TABS
5.0000 mg | ORAL_TABLET | ORAL | Status: DC
  Filled 2015-09-19: qty 0.5

## 2015-09-19 MED ORDER — GLIPIZIDE 5 MG PO TABS
5.0000 mg | ORAL_TABLET | Freq: Every day | ORAL | Status: DC
  Administered 2015-09-20: 5 mg via ORAL
  Filled 2015-09-19 (×3): qty 1

## 2015-09-19 NOTE — Progress Notes (Addendum)
PROGRESS NOTE                                                                                                                                                                                                             Patient Demographics:    Alexis [name], is a 82 y.o. female, DOB - 1932-03-26, MOQ:[id number]  Admit date - 09/18/2015   Admitting Physician Ozella Rocks, MD  Outpatient Primary MD for the patient is BEAL, [name], PA-C  LOS - 1  Outpatient Specialists:   Chief Complaint  Patient presents with  . COPD  . Shortness of Breath       Brief Narrative    Alexis [name] is a 82 y.o. female with medical history significant of COPD, diabetes, atrial fibrillation, hypertension, CHF, coming to the emergency department with chief complaint of shortness of breathpresent for the past 3 days, without productive cough. She denies any fever, chills night sweats. She denies any chest pain or abdominal pain. She denies any recent trips or sick contacts. Denies fevers, chills, night sweats, vision changes, or mucositis. Denies any respiratory complaints. Denies any chest pain or palpitations.denies any nausea vomiting or diarrhea. Denies any dysuria. Reports recent sacral wound that is painful, has not been treated., or neuropathy. Denies any bleeding issues such as epistaxis, hematemesis, hematuria or hematochezia. Ambulating without difficulty.   ED Course: BP 125/66 mmHg  Pulse 97  Temp(Src) 99 F (37.2 C) (Rectal)  Resp 22  SpO2 100% she received nebulizer treatment with improvement of her symptoms. white count is normal at 9.3. Hemoglobin 7.5 (8.1 on 3/11) with MCV of 77.5. Hemoccult-negative.Glucose 355. Creatinine 2.63. Troponin negative at 0.01. Chest x-ray was consistent with continuous CHF pattern with small pleural effusions bilaterally, with slight improvement since prior study. EKG shows atrial  fibrillation, initially with rapid ventricular response, now better controlled, QTc 472. INR 2.77, she will be admitted for observation telemetry for the management of symptoms.    Subjective:    Alexis [name] today has, No headache, No chest pain, No abdominal pain - No Nausea, No new weakness tingling or numbness, No Cough - Improved SOB.     Assessment  & Plan :     1. Acute hypoxic resp failure due to acute on chronic diastolic CHF. EF 55-60%. Echo done recently hence not repeated,chest x-ray  and physical exam consistent of pulmonary edema, she is currently on IV Lasix and already feels better,lower extremity edema almost completely resolved but continues to have some rales, continue IV Lasix for another 24 hours, on beta blocker. Recommend outpatient cardiology follow-up post discharge.   2. Underlying COPD. No wheezing. Supportive Care.  3. Essential hypertension. On hydralazine and beta blocker continue.  4.Dyslipidemia. Continue statin.  5. Chronic atrial fibrillation. [place] vasc 2 score of at least 4. Goal will be rate controlled, continue beta blocker and Coumadin.  6. CKD stage V. Baseline creatinine of 2.5. At baseline.  7. Anemia of chronic disease. Stable. Anemia panel inconclusive.  8. Hypothyroidism. Resume home Med Tapizole.  9. DM2 - on ISS , Resume home dose Glucotrol  Lab Results  Component Value Date   HGBA1C 6.3* 06/21/2015   CBG (last 3)   Recent Labs  09/18/15 2016 09/19/15 0528 09/19/15 1152  GLUCAP 317* 268* 155*      Code Status : DNR  Family Communication  : None  Disposition Plan  : Home 1-2 days  Barriers For Discharge : CHF  Consults  :    Procedures  :   DVT Prophylaxis  :  Couamdin  Lab Results  Component Value Date   INR 4.22* 09/19/2015   INR 2.77* 09/18/2015   INR 6.10* 09/08/2015     Lab Results  Component Value Date   PLT 473* 09/18/2015    Antibiotics  :     Anti-infectives    None         Objective:   Filed Vitals:   09/19/15 0002 09/19/15 0529 09/19/15 0746 09/19/15 1154  BP: 127/50 144/66 108/64 132/87  Pulse: 71 72 68 66  Temp: 97.7 F (36.5 C) 97.6 F (36.4 C)  98 F (36.7 C)  TempSrc: Oral Oral  Oral  Resp: 18 18 16 18   Height:      Weight:  59.92 kg (132 lb 1.6 oz)    SpO2: 96% 99% 98% 98%    Wt Readings from Last 3 Encounters:  09/19/15 59.92 kg (132 lb 1.6 oz)  08/18/15 61.644 kg (135 lb 14.4 oz)  08/04/15 62 kg (136 lb 11 oz)     Intake/Output Summary (Last 24 hours) at 09/19/15 1257 Last data filed at 09/19/15 0900  Gross per 24 hour  Intake    220 ml  Output    875 ml  Net   -655 ml     Physical Exam  Awake Alert, Oriented X 3, No new F.N deficits, Normal affect [place].AT,PERRAL Supple Neck,No JVD, No cervical lymphadenopathy appriciated.  Symmetrical Chest wall movement, Good air movement bilaterally, few hospitalist RRR,No Gallops,Rubs or new Murmurs, No Parasternal Heave +ve B.Sounds, Abd Soft, No tenderness, No organomegaly appriciated, No rebound - guarding or rigidity. No Cyanosis, Clubbing or edema, No new Rash or bruise     Data Review:    CBC  Recent Labs Lab 09/18/15 1220 09/18/15 1602  WBC 9.3  --   HGB 7.5* 8.5*  HCT 25.1* 25.0*  PLT 473*  --   MCV 77.5*  --   MCH 23.1*  --   MCHC 29.9*  --   RDW 19.7*  --   LYMPHSABS 0.6*  --   MONOABS 0.5  --   EOSABS 0.1  --   BASOSABS 0.0  --     Chemistries   Recent Labs Lab 09/18/15 1533 09/18/15 1602 09/19/15 0355  NA 137 137 137  K  3.6 3.6 3.6  CL 96* 95* 96*  CO2 27  --  30  GLUCOSE 355* 351* 337*  BUN 45* 43* 45*  CREATININE 2.63* 2.40* 2.55*  CALCIUM 8.7*  --  8.8*   ------------------------------------------------------------------------------------------------------------------ No results for input(s): CHOL, HDL, LDLCALC, TRIG, CHOLHDL, LDLDIRECT in the last 72 hours.  Lab Results  Component Value Date   HGBA1C 6.3* 06/21/2015    ------------------------------------------------------------------------------------------------------------------ No results for input(s): TSH, T4TOTAL, T3FREE, THYROIDAB in the last 72 hours.  Invalid input(s): FREET3 ------------------------------------------------------------------------------------------------------------------  Recent Labs  09/18/15 1906  VITAMINB12 690  FOLATE 18.7  FERRITIN 44  TIBC 353  IRON 13*  RETICCTPCT 2.1    Coagulation profile  Recent Labs Lab 09/18/15 1220 09/19/15 0904  INR 2.77* 4.22*    No results for input(s): DDIMER in the last 72 hours.  Cardiac Enzymes No results for input(s): CKMB, TROPONINI, MYOGLOBIN in the last 168 hours.  Invalid input(s): CK ------------------------------------------------------------------------------------------------------------------    Component Value Date/Time   BNP 802.0* 09/18/2015 1545    Inpatient Medications  Scheduled Meds: . atorvastatin  20 mg Oral QHS  . escitalopram  10 mg Oral Daily  . furosemide  80 mg Intravenous Q12H  . gabapentin  100 mg Oral QHS  . hydrALAZINE  50 mg Oral Q12H  . insulin aspart  0-9 Units Subcutaneous TID WC  . metoprolol tartrate  25 mg Oral BID  . potassium chloride SA  20 mEq Oral Daily  . sodium chloride flush  3 mL Intravenous Q12H  . Warfarin - Physician Dosing Inpatient   Does not apply q1800   Continuous Infusions:  PRN Meds:.sodium chloride, acetaminophen, ondansetron (ZOFRAN) IV, sodium chloride flush  Micro Results No results found for this or any previous visit (from the past 240 hour(s)).  Radiology Reports Dg Chest 2 View  09/19/2015  CLINICAL DATA:  Short of breath EXAM: CHEST  2 VIEW COMPARISON:  Yesterday FINDINGS: Moderate cardiomegaly. Diffuse airspace disease bilaterally has improved. Bilateral small pleural effusions are not significantly changed with associated patchy atelectasis. No pneumothorax. Stable left rib deformities.  IMPRESSION: No active cardiopulmonary disease. Electronically Signed   By: Jolaine Click M.D.   On: 09/19/2015 07:44   Dg Chest 2 View  09/18/2015  CLINICAL DATA:  Worsening shortness of breath with cough. Green sputum for 3 days. EXAM: CHEST  2 VIEW COMPARISON:  08/02/2015 FINDINGS: Cardiomegaly with vascular congestion and diffuse interstitial and alveolar opacities most compatible with edema/CHF. Small bilateral effusions. Slight improvement in the edema since prior study. No acute bony abnormality. IMPRESSION: Continued CHF pattern with small bilateral pleural effusions. Slight improvement since prior study. Electronically Signed   By: Charlett Nose M.D.   On: 09/18/2015 13:41    Time Spent in minutes  30   SINGH,PRASHANT K M.D on 09/19/2015 at 12:57 PM  Between 7am to 7pm - Pager - [phone]  After 7pm go to [email or web address] - password [facility]  [facility] -  Office  [phone]

## 2015-09-19 NOTE — Discharge Instructions (Signed)

## 2015-09-19 NOTE — Consult Note (Signed)
WOC wound consult note Reason for Consult: sacrum Wound type: Stage 2 Pressure Injury left inner gluteal cleft Pressure Ulcer POA: Yes Measurement: 0.5cm x 0.5cm x 0.1cm  Wound bed: 100% pink, moist Drainage (amount, consistency, odor) minimal  Periwound: red but blanches Dressing procedure/placement/frequency: Continue soft silicone foam to gluteal cleft to protect and insulate wound.  Add chair pad for pressure redistribution.  Patient to take chair pad home for use, explained rationale to patient.   Discussed POC with patient and bedside nurse.  Re consult if needed, will not follow at this time. Thanks  Vergie Zahm [employer], CWOCN [phone])

## 2015-09-19 NOTE — Progress Notes (Signed)
Pt felt dizzy when Nt trying to assisst in getting pt out from bed  Hypostatic VS taken and charted No other symptom  . Felt better when back to bed . No ectopy and arrythmia reported when asked to  CMT . DR Thedore Mins aware with order . Family  Informed while in the room

## 2015-09-20 ENCOUNTER — Encounter (HOSPITAL_COMMUNITY): Payer: Self-pay | Admitting: Nurse Practitioner

## 2015-09-20 ENCOUNTER — Encounter: Payer: Medicare Other | Admitting: Pharmacist Clinician (PhC)/ Clinical Pharmacy Specialist

## 2015-09-20 DIAGNOSIS — I5031 Acute diastolic (congestive) heart failure: Secondary | ICD-10-CM | POA: Diagnosis not present

## 2015-09-20 DIAGNOSIS — R0602 Shortness of breath: Secondary | ICD-10-CM

## 2015-09-20 DIAGNOSIS — E119 Type 2 diabetes mellitus without complications: Secondary | ICD-10-CM

## 2015-09-20 DIAGNOSIS — E1165 Type 2 diabetes mellitus with hyperglycemia: Secondary | ICD-10-CM | POA: Diagnosis not present

## 2015-09-20 DIAGNOSIS — I5032 Chronic diastolic (congestive) heart failure: Secondary | ICD-10-CM

## 2015-09-20 DIAGNOSIS — I481 Persistent atrial fibrillation: Secondary | ICD-10-CM | POA: Diagnosis not present

## 2015-09-20 DIAGNOSIS — I4819 Other persistent atrial fibrillation: Secondary | ICD-10-CM | POA: Insufficient documentation

## 2015-09-20 DIAGNOSIS — I5033 Acute on chronic diastolic (congestive) heart failure: Secondary | ICD-10-CM | POA: Diagnosis not present

## 2015-09-20 DIAGNOSIS — I480 Paroxysmal atrial fibrillation: Secondary | ICD-10-CM

## 2015-09-20 DIAGNOSIS — I110 Hypertensive heart disease with heart failure: Secondary | ICD-10-CM

## 2015-09-20 DIAGNOSIS — D649 Anemia, unspecified: Secondary | ICD-10-CM | POA: Diagnosis not present

## 2015-09-20 DIAGNOSIS — E118 Type 2 diabetes mellitus with unspecified complications: Secondary | ICD-10-CM

## 2015-09-20 LAB — BASIC METABOLIC PANEL
ANION GAP: 11 (ref 5–15)
BUN: 56 mg/dL — ABNORMAL HIGH (ref 6–20)
CALCIUM: 8.9 mg/dL (ref 8.9–10.3)
CO2: 31 mmol/L (ref 22–32)
CREATININE: 2.79 mg/dL — AB (ref 0.44–1.00)
Chloride: 98 mmol/L — ABNORMAL LOW (ref 101–111)
GFR calc non Af Amer: 15 mL/min — ABNORMAL LOW (ref >60)
GFR, EST AFRICAN AMERICAN: 17 mL/min — AB (ref >60)
Glucose, Bld: 190 mg/dL — ABNORMAL HIGH (ref 65–99)
Potassium: 4 mmol/L (ref 3.5–5.1)
SODIUM: 140 mmol/L (ref 135–145)

## 2015-09-20 LAB — GLUCOSE, CAPILLARY
GLUCOSE-CAPILLARY: 156 mg/dL — AB (ref 65–99)
GLUCOSE-CAPILLARY: 159 mg/dL — AB (ref 65–99)
Glucose-Capillary: 123 mg/dL — ABNORMAL HIGH (ref 65–99)
Glucose-Capillary: 133 mg/dL — ABNORMAL HIGH (ref 65–99)

## 2015-09-20 LAB — PROTIME-INR
INR: 3.49 — ABNORMAL HIGH (ref 0.00–1.49)
PROTHROMBIN TIME: 34.3 s — AB (ref 11.6–15.2)

## 2015-09-20 MED ORDER — INSULIN ASPART 100 UNIT/ML ~~LOC~~ SOLN
0.0000 [IU] | Freq: Three times a day (TID) | SUBCUTANEOUS | Status: DC
  Administered 2015-09-20: 2 [IU] via SUBCUTANEOUS
  Administered 2015-09-22: 3 [IU] via SUBCUTANEOUS

## 2015-09-20 MED ORDER — METHIMAZOLE 5 MG PO TABS
5.0000 mg | ORAL_TABLET | ORAL | Status: DC
  Administered 2015-09-20: 5 mg via ORAL
  Filled 2015-09-20: qty 1

## 2015-09-20 MED ORDER — INSULIN ASPART 100 UNIT/ML ~~LOC~~ SOLN
0.0000 [IU] | Freq: Every day | SUBCUTANEOUS | Status: DC
  Administered 2015-09-21: 2 [IU] via SUBCUTANEOUS

## 2015-09-20 NOTE — Consult Note (Signed)
Cardiology Consult    Patient ID: Alexis [name] MRN: [id number], DOB/AGE: 1931/07/14   Admit date: 09/18/2015 Date of Consult: 09/20/2015  Primary Physician: [name], PA-C Primary Cardiologist: [name], MD  Requesting Provider: Rod Can, MD  Patient Profile    82 y/o ? with a h/o HTN, DM, PAF, and diastolic CHF, who was admitted on 4/24 secondary to recurrent CHF.  Past Medical History   Past Medical History  Diagnosis Date  . Hypertensive heart disease   . Neuropathy (HCC)   . Anxiety   . PONV (postoperative nausea and vomiting)   . Chronic diastolic CHF (congestive heart failure) (HCC)     a. 05/2015 Echo: EF 55-60%, mild LVH, no rwma, mild MR, mildly dil RA, mod TR, PASP .  Marland Kitchen COPD (chronic obstructive pulmonary disease) (HCC)     a. on home O2 @ 3lpm.  . Family history of adverse reaction to anesthesia   . Type II diabetes mellitus (HCC)   . Macular degeneration, bilateral   . Varicose veins   . GIB (gastrointestinal bleeding)     a. hx -   . Paroxysmal atrial fibrillation (HCC)     a. CHA2DS2VASc = 6-->coumadin.  . Confusion state   . CKD (chronic kidney disease), stage IV (HCC)   . Elevated troponin     a. H/o elevated trop in setting of CHF; b. 06/2013 Lexiscan MV: nl EF, mild anterior breast attenuation, no ischemia.     Past Surgical History  Procedure Laterality Date  . Hip fracture surgery  2004  . Abdominal hysterectomy    . Vein surgery    . Esophagogastroduodenoscopy N/A 12/01/2014    Procedure: ESOPHAGOGASTRODUODENOSCOPY (EGD);  Surgeon: Hilarie Fredrickson, MD;  Location: [facility] ENDOSCOPY;  Service: Endoscopy;  Laterality: N/A;    Allergies  Allergies  Allergen Reactions  . Yellow Dyes (Non-Tartrazine) Nausea And Vomiting    "deathly allergic" No other information given to explain reaction. Per Daughter- this was an IV dye. Ok with yellow coloring on Coumadin tablets.   . Codeine Hives  . Other Other (See Comments)    novocaine broke mouth out   . Penicillins Hives  . Procaine Hives    History of Present Illness    82 y/o ? with a h/o HTN, DM II, PAF on coumadin, and diastolic CHF with multiple admissions over the past few years.  Most recent echo was in January with EF of 55-60%.  She was last admitted with respiratory failure and CHF in 06/2015.  She last saw Dr. [name] on 3/24, at which time she was clinically stable at a weight of 135 lbs.  She says that she did well following that visit and continued to weigh herself daily @ home, usually weighing 131-132 lbs.  She has some degree of chronic DOE, wearing 3 lpm of O2 just about all day now.  She also has chronic orthopnea and sleeps in a recliner.  Approximately, 2-3 days prior to admission, she began to note increasing dyspnea coinciding with palpitations and faster heart rates.  After 2 days of ongoing Ss, she presented to the [facility] ED where CXR showed CHF with small bilat pleural effusions.  ECG showed AF @ 115 bpm.  She was admitted and placed on IV lasix.  She is minus 680 ml this admission.  Wt is down 1 lb to 131 lbs.  BUN/Creat have bumped to 56/2.79 respectively (43/2.4 on admission).  She remains in AF (was not in AF  on last ecg 3/8), which is currently rate controlled on  blocker therapy.  She says that she does not yet feel back to baseline.  She denies chest pain, pnd, n, v, dizziness, syncope, edema, weight gain, or early satiety.   Inpatient Medications    . atorvastatin  20 mg Oral QHS  . escitalopram  10 mg Oral Daily  . furosemide  80 mg Oral BID  . gabapentin  100 mg Oral QHS  . glipiZIDE  5 mg Oral QAC breakfast  . hydrALAZINE  25 mg Oral Q12H  . insulin aspart  0-9 Units Subcutaneous TID WC  . isosorbide mononitrate  30 mg Oral Daily  . methimazole  5 mg Oral Q M,W,F  . metoprolol tartrate  25 mg Oral BID  . potassium chloride SA  20 mEq Oral Daily  . Warfarin - Physician Dosing Inpatient   Does not apply q43    Family History    Family History  Problem  Relation Age of Onset  . Colon cancer Father 68    advanced when discovered, no surgery or therapy.  this led to his death  . Breast cancer Sister   . Cancer Brother   . Breast cancer Mother   . Lung cancer Brother   . Thyroid disease Neg Hx     Social History    Social History   Social History  . Marital Status: Widowed    Spouse Name: N/A  . Number of Children: Y  . Years of Education: N/A   Occupational History  . retired    Social History Main Topics  . Smoking status: Passive Smoke Exposure - Never Smoker  . Smokeless tobacco: Never Used  . Alcohol Use: No  . Drug Use: No  . Sexual Activity: Not on file   Other Topics Concern  . Not on file   Social History Narrative   Originally from [place]. She has always lived in [place] & had no travel. She has significant smoke exposure through her husband for 60 years. She has no pets currently. No prior bird or mold exposure. 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      Review of Systems    General:  No chills, fever, night sweats or weight changes.  Cardiovascular:  No chest pain, dyspnea on exertion, edema, orthopnea, palpitations, paroxysmal nocturnal dyspnea. Dermatological: No rash, lesions/masses Respiratory: No cough, dyspnea Urologic: No hematuria, dysuria Abdominal:   No nausea, vomiting, diarrhea, bright red blood per rectum, melena, or hematemesis Neurologic:  No visual changes, wkns, changes in mental status. All other systems reviewed and are otherwise negative except as noted above.  Physical Exam    Blood pressure 124/56, pulse 55, temperature 98.3 F (36.8 C), temperature source Oral, resp. rate 18, height 5\' 2"  (1.575 m), weight 131 lb 12.8 oz (59.784 kg), SpO2 100 %.  General: Pleasant, NAD Psych: Normal affect. Neuro: Alert and oriented X 3. Moves all  extremities spontaneously. HEENT: Normal  Neck: Supple without bruits or JVD. Lungs:  Resp regular and unlabored, diminished breath sounds bilat with crackles @ bases. Heart: IR, IR, rate controlled, no s3, s4, or murmurs. Abdomen: Soft, non-tender, non-distended, BS + x 4.  Extremities: No clubbing, cyanosis or edema. DP/PT/Radials 2+ and equal bilaterally.  Labs    Troponin [facility] of Care Test)  Recent Labs  09/18/15  1239  TROPIPOC 0.01   Lab Results  Component Value Date   WBC 9.3 09/18/2015   HGB 8.5* 09/18/2015   HCT 25.0* 09/18/2015   MCV 77.5* 09/18/2015   PLT 473* 09/18/2015    Recent Labs Lab 09/20/15 0436  NA 140  K 4.0  CL 98*  CO2 31  BUN 56*  CREATININE 2.79*  CALCIUM 8.9  GLUCOSE 190*   Lab Results  Component Value Date   CHOL 126 12/14/2014   HDL 40* 12/14/2014   LDLCALC 72 12/14/2014   TRIG 71 12/14/2014     Radiology Studies    Dg Chest 2 View  09/19/2015  CLINICAL DATA:  Short of breath EXAM: CHEST  2 VIEW COMPARISON:  Yesterday FINDINGS: Moderate cardiomegaly. Diffuse airspace disease bilaterally has improved. Bilateral small pleural effusions are not significantly changed with associated patchy atelectasis. No pneumothorax. Stable left rib deformities. IMPRESSION: No active cardiopulmonary disease. Electronically Signed   By: Jolaine Click M.D.   On: 09/19/2015 07:44   Dg Chest 2 View  09/18/2015  CLINICAL DATA:  Worsening shortness of breath with cough. Green sputum for 3 days. EXAM: CHEST  2 VIEW COMPARISON:  08/02/2015 FINDINGS: Cardiomegaly with vascular congestion and diffuse interstitial and alveolar opacities most compatible with edema/CHF. Small bilateral effusions. Slight improvement in the edema since prior study. No acute bony abnormality. IMPRESSION: Continued CHF pattern with small bilateral pleural effusions. Slight improvement since prior study. Electronically Signed   By: Charlett Nose M.D.   On: 09/18/2015 13:41    ECG & Cardiac  Imaging    Afib, 115, no acute ST/T changes.  Assessment & Plan    1.  Acute on chronic diastolic CHF:  Pt presented 4/24 with a 2-3 day h/o progressive dyspnea in the setting of tachypalpitations.  Upon presentation, she had evidence of CHF on CXR with a BNP of 802.  She was also found in Afib @ 115, which is new since July 2016.  She has been diuresed and currently appears euvolemic though she continues to have crackles on exam.  BUN/Creat/Bicarb bumping some.  Cont IV lasix for one more day.  Suspect that in the setting of COPD, she has very little reserve and we will need to run her more on the dry side.  Also suspect that recurrent AFib (in addition to h/o dietary noncompliance), is playing a major role in her diast dysfxn at this point and she would benefit from antiarrhythmic therapy.  INR's have been therapeutic.  We will ask EP to see re: best antiarrhythmic choice.  Repeat TSH given h/o hyperthyroidism on methimazole.  2.  PAF:  Back in Afib as of this admission.  Says that she started to note tachypalps a few days prior to admission, coinciding with dyspnea.  As above, suspect this may be driving Ss at this point.  Will ask EP to see.  Poor candidate for amio (hyperthyroidism/COPD), dronedarone (CHF), Sotalol (CHF).  ? Flecainide vs tikosyn.  INR Rx.  Cont  blocker.  3.  Hypertensive Heart Dzs:  Stable.  4.  CKD IV:  BUN/Creat bumping.  ? Role of progressive renal failure in volume issues.  Follow.  Likely switch to PO torsemide tomoroow.  5.  DM II:  Per IM.  6.  Microcytic Anemia: stable/low.  Signed, Nicolasa Ducking, NP 09/20/2015, 10:54 AM

## 2015-09-20 NOTE — Progress Notes (Addendum)
[facility]                                                                              Patient Demographics  Alexis [name], is a 82 y.o. female, DOB - 04-19-1932, ZOX:[id number]  Admit date - 09/18/2015   Admitting Physician Ozella Rocks, MD  Outpatient Primary MD for the patient is [name], PA-C  Outpatient specialists:   LOS -   days    Chief Complaint  Patient presents with  . COPD  . Shortness of Breath       Brief summary   Patient is a 82 year old female with COPD, diabetes, atrial fibrillation, hypertension, CHF presented with shortness of breath for 3 days prior to admission, no fevers or chills or any productive cough, or chest pain. In ED, hemoglobin 7.5, Hemoccult negative, received a nebulizer treatment withan improvement. Glucose 355, creatinine 2.63. Chest x-ray showed small pleural effusions bilaterally with pulmonary vascular congestion. EKG showed atrial fibrillation initially with rapid ventricular response. Patient was admitted for further workup.   Assessment & Plan    Principal Problem:    Acute hypoxic resp failure due to acute on chronic diastolic CHF.  - 2-D echo 1/17 showed EF of 55-60%  - Patient presented with pulmonary edema, placed on IV Lasix - Only negative balance of 680 mL, creatinine trending up to 2.79, weight is down only 1lb since admission - Cardiology consult   Underlying COPD. No wheezing. Supportive Care.  Essential hypertension. On hydralazine and beta blocker continue.  Dyslipidemia. Continue statin.  Chronic atrial fibrillation.  - CHADS VASC score 4.  - Currently rate controlled, continue Coumadin per pharmacy, INR 3.49   CKD stage V. Baseline creatinine of 2.5.  - Monitored closely with diuresis   Anemia of chronic disease. Stable. Anemia panel inconclusive.  Hyperthyroidism.  - Follow TSH, continue Tapazole for now  DM2 - on ISS , Resume home dose Glucotrol - Follow hemoglobin A1c,  blood sugar  Uncontrolled, increase sliding scale insulin to moderate, place on current modified diet. On glipizide  Stage 2 Pressure Injury left inner gluteal cleft - Per wound care recommendations  Code Status: DO NOT RESUSCITATE  Family Communication: Discussed in detail with the patient, all imaging results, lab results explained to the patient   Disposition Plan:   Time Spent in minutes   25 minutes  Procedures  None  Consults   Cardiology  DVT Prophylaxis  warfarin  Medications  Scheduled Meds: . atorvastatin  20 mg Oral QHS  . escitalopram  10 mg Oral Daily  . furosemide  80 mg Oral BID  . gabapentin  100 mg Oral QHS  . glipiZIDE  5 mg Oral QAC breakfast  . hydrALAZINE  25 mg Oral Q12H  . insulin aspart  0-9 Units Subcutaneous TID WC  . isosorbide mononitrate  30 mg Oral Daily  . methimazole  5 mg Oral Q M,W,F  . metoprolol tartrate  25 mg Oral BID  . potassium chloride SA  20 mEq Oral Daily  . Warfarin - Physician Dosing Inpatient   Does not apply q1800   Continuous Infusions:  PRN Meds:.acetaminophen,  ondansetron (ZOFRAN) IV   Antibiotics   Anti-infectives    None        Subjective:   Alexis [name] was seen and examined today.  Not feeling too good, dizziness, no chest pain. Shortness of breath is improving.  Patient denies dizziness, abdominal pain, N/V/D/C, new weakness, numbess, tingling. No acute events overnight.    Objective:   Filed Vitals:   09/20/15 0031 09/20/15 0036 09/20/15 0415 09/20/15 1123  BP:   124/56 124/45  Pulse: 77  55 59  Temp: 98.1 F (36.7 C)  98.3 F (36.8 C) 98.5 F (36.9 C)  TempSrc: Oral  Oral Oral  Resp:   18 18  Height:      Weight:   59.784 kg (131 lb 12.8 oz)   SpO2:  99% 100% 100%    Intake/Output Summary (Last 24 hours) at 09/20/15 1157 Last data filed at 09/20/15 1000  Gross per 24 hour  Intake   1175 ml  Output   1200 ml  Net    -25 ml     Wt Readings from Last 3 Encounters:  09/20/15  59.784 kg (131 lb 12.8 oz)  08/18/15 61.644 kg (135 lb 14.4 oz)  08/04/15 62 kg (136 lb 11 oz)     Exam  General: Alert and oriented x 3, NAD  HEENT:  PERRLA, EOMI, Anicteric Sclera, mucous membranes moist.   Neck: Supple, no JVD, no masses  CVS: S1 S2 auscultated, no rubs, murmurs or gallops. Regular rate and rhythm.  Respiratory: Decreased breath sounds at the bases  Abdomen: Soft, nontender, nondistended, + bowel sounds  Ext: no cyanosis clubbing, trace edema  Neuro: AAOx3, Cr N's II- XII. Strength 5/5 upper and lower extremities bilaterally  Skin: No rashes  Psych: Normal affect and demeanor, alert and oriented x3    Data Reviewed:  I have personally reviewed following labs and imaging studies  Micro Results No results found for this or any previous visit (from the past 240 hour(s)).  Radiology Reports Dg Chest 2 View  09/19/2015  CLINICAL DATA:  Short of breath EXAM: CHEST  2 VIEW COMPARISON:  Yesterday FINDINGS: Moderate cardiomegaly. Diffuse airspace disease bilaterally has improved. Bilateral small pleural effusions are not significantly changed with associated patchy atelectasis. No pneumothorax. Stable left rib deformities. IMPRESSION: No active cardiopulmonary disease. Electronically Signed   By: Jolaine Click M.D.   On: 09/19/2015 07:44   Dg Chest 2 View  09/18/2015  CLINICAL DATA:  Worsening shortness of breath with cough. Green sputum for 3 days. EXAM: CHEST  2 VIEW COMPARISON:  08/02/2015 FINDINGS: Cardiomegaly with vascular congestion and diffuse interstitial and alveolar opacities most compatible with edema/CHF. Small bilateral effusions. Slight improvement in the edema since prior study. No acute bony abnormality. IMPRESSION: Continued CHF pattern with small bilateral pleural effusions. Slight improvement since prior study. Electronically Signed   By: Charlett Nose M.D.   On: 09/18/2015 13:41    CBC  Recent Labs Lab 09/18/15 1220 09/18/15 1602  WBC 9.3   --   HGB 7.5* 8.5*  HCT 25.1* 25.0*  PLT 473*  --   MCV 77.5*  --   MCH 23.1*  --   MCHC 29.9*  --   RDW 19.7*  --   LYMPHSABS 0.6*  --   MONOABS 0.5  --   EOSABS 0.1  --   BASOSABS 0.0  --     Chemistries   Recent Labs Lab 09/18/15 1533 09/18/15 1602 09/19/15 0355 09/20/15 0436  NA 137 137 137 140  K 3.6 3.6 3.6 4.0  CL 96* 95* 96* 98*  CO2 27  --  30 31  GLUCOSE 355* 351* 337* 190*  BUN 45* 43* 45* 56*  CREATININE 2.63* 2.40* 2.55* 2.79*  CALCIUM 8.7*  --  8.8* 8.9   ------------------------------------------------------------------------------------------------------------------ estimated creatinine clearance is 12.5 mL/min (by C-G formula based on Cr of 2.79). ------------------------------------------------------------------------------------------------------------------ No results for input(s): HGBA1C in the last 72 hours. ------------------------------------------------------------------------------------------------------------------ No results for input(s): CHOL, HDL, LDLCALC, TRIG, CHOLHDL, LDLDIRECT in the last 72 hours. ------------------------------------------------------------------------------------------------------------------ No results for input(s): TSH, T4TOTAL, T3FREE, THYROIDAB in the last 72 hours.  Invalid input(s): FREET3 ------------------------------------------------------------------------------------------------------------------  Recent Labs  09/18/15 1906  VITAMINB12 690  FOLATE 18.7  FERRITIN 44  TIBC 353  IRON 13*  RETICCTPCT 2.1    Coagulation profile  Recent Labs Lab 09/18/15 1220 09/19/15 0904 09/20/15 0436  INR 2.77* 4.22* 3.49*    No results for input(s): DDIMER in the last 72 hours.  Cardiac Enzymes No results for input(s): CKMB, TROPONINI, MYOGLOBIN in the last 168 hours.  Invalid input(s):  CK ------------------------------------------------------------------------------------------------------------------ Invalid input(s): POCBNP   Recent Labs  09/19/15 0528 09/19/15 1152 09/19/15 1628 09/19/15 1957 09/20/15 0536 09/20/15 1126  GLUCAP 268* 155* 216* 286* 156* 123*     Nohely Whitehorn M.D. [facility] 09/20/2015, 11:57 AM  Pager: [phone] Between 7am to 7pm - call Pager - [phone]  After 7pm go to [email or web address] - password [email or web address]  Call night coverage person covering after 7pm

## 2015-09-20 NOTE — Progress Notes (Signed)
Per pharmacy, and warfarin admin, it was not scheduled/given today, as elevated INR is 3.49, and elevated Protime with INR is 34.3.

## 2015-09-20 NOTE — Care Management Obs Status (Signed)
MEDICARE OBSERVATION STATUS NOTIFICATION   Patient Details  Name: Alexis [name] MRN: [id number] Date of Birth: Mar 06, 1932   Medicare Observation Status Notification Given:  Yes    Cherylann Parr, RN 09/20/2015, 3:06 PM

## 2015-09-20 NOTE — Care Management Note (Signed)
Case Management Note  Patient Details  Name: Alexis [name] MRN: [id number] Date of Birth: 1932-04-03  Subjective/Objective:     Pt admitted with COPD, SOB               Action/Plan:  Pt is from home with adult son - uses walker for ambulation in the home.  CM will continue to monitor for disposition needs.    Expected Discharge Date:                  Expected Discharge Plan:  Home w Home Health Services  In-House Referral:     Discharge planning Services  CM Consult  Post Acute Care Choice:    Choice offered to:     DME Arranged:    DME Agency:     HH Arranged:    HH Agency:     Status of Service:  In process, will continue to follow  Medicare Important Message Given:    Date Medicare IM Given:    Medicare IM give by:    Date Additional Medicare IM Given:    Additional Medicare Important Message give by:     If discussed at Long Length of Stay Meetings, dates discussed:    Additional Comments:  Cherylann Parr, RN 09/20/2015, 3:14 PM

## 2015-09-20 NOTE — Consult Note (Signed)
CARDIOLOGY CONSULT NOTE   Patient ID: LEE KALT MRN: [id number], DOB/AGE: [birth year]   Admit date: 09/18/2015 Date of Consult: 09/20/2015   Primary Physician: [name], PA-C Primary Cardiologist: Dr. [name]  Pt. Profile  Alexis [name] is a frail 82 -year-old Caucasian female with past medical history of chronic diastolic heart failure, hypertension, anxiety, DM, COPD on 3 L home O2, PAF and CAD stage IV presented with tachypalpitation and acute HF. Found to be in afib with HR 110s  Problem List  Past Medical History  Diagnosis Date  . Hypertensive heart disease   . Neuropathy (HCC)   . Anxiety   . PONV (postoperative nausea and vomiting)   . Chronic diastolic CHF (congestive heart failure) (HCC)     a. 05/2015 Echo: EF 55-60%, mild LVH, no rwma, mild MR, mildly dil RA, mod TR, PASP .  Marland Kitchen COPD (chronic obstructive pulmonary disease) (HCC)     a. on home O2 @ 3lpm.  . Family history of adverse reaction to anesthesia   . Type II diabetes mellitus (HCC)   . Macular degeneration, bilateral   . Varicose veins   . GIB (gastrointestinal bleeding)     a. History of GIB.  Marland Kitchen Paroxysmal atrial fibrillation (HCC)     a. CHA2DS2VASc = 6-->coumadin.  . Confusion state   . CKD (chronic kidney disease), stage IV (HCC)   . Elevated troponin     a. H/o elevated trop in setting of CHF; b. 06/2013 Lexiscan MV: nl EF, mild anterior breast attenuation, no ischemia.     Past Surgical History  Procedure Laterality Date  . Hip fracture surgery  2004  . Abdominal hysterectomy    . Vein surgery    . Esophagogastroduodenoscopy N/A 12/01/2014    Procedure: ESOPHAGOGASTRODUODENOSCOPY (EGD);  Surgeon: Hilarie Fredrickson, MD;  Location[facility] ENDOSCOPY;  Service: Endoscopy;  Laterality: N/A;     Allergies  Allergies  Allergen Reactions  . Yellow Dyes (Non-Tartrazine) Nausea And Vomiting    "deathly allergic" No other information given to explain reaction. Per Daughter- this was an IV dye.  Ok with yellow coloring on Coumadin tablets.   . Codeine Hives  . Other Other (See Comments)    novocaine broke mouth out  . Penicillins Hives  . Procaine Hives    HPI   Alexis [name] is a frail 82 -year-old Caucasian female with past medical history of chronic diastolic heart failure, hypertension, anxiety, DM, COPD on 3 L home O2, PAF and CAD stage IV. She did have a history of elevated troponin in the setting of acute heart failure. However Myoview obtained on 07/21/2013 was negative for ischemia with normal EF. Last echocardiogram obtained on 06/12/2015 showed EF 55-60%, mild LVH, moderate TR, mild MR, PA peak pressure 58 mmHg, normal left atrial size. She was admitted in July 2016 GI bleed,she was also noted to have new onset of atrial fibrillation and that time. She was not immediately started on systemic anticoagulation due to concern of bleeding, she was started on blood thinner afterward in office. She has since been followed closely in the Coumadin clinic. She has been admitted at least 5 times in the last 6 months for acute respiratory failure either due to COPD exacerbation or acute on chronic diastolic heart failure. She [facility] on [facility]2017with a few day onset of increasing shortness of breath and tachycardia palpitation.  On arrival, it was noted she was in atrial fibrillation with RVR and also  in acute on chronic diastolic heart failure. She was admitted for IV diuresis. She has been taking 20 mg less torsemide daily than prescribed. Her previous EKG in March 2017 shows she was in sinus rhythm back then. Given her frequent admission for CHF, there was concern that her atrial fibrillation has aggravated her diastolic dysfunction and lead to a CHF. Electrophysiology service has been consulted for consideration of antiarrhythmic therapy for this elderly patient.   Inpatient Medications  . atorvastatin  20 mg Oral QHS  . escitalopram  10 mg Oral Daily  .  furosemide  80 mg Oral BID  . gabapentin  100 mg Oral QHS  . glipiZIDE  5 mg Oral QAC breakfast  . hydrALAZINE  25 mg Oral Q12H  . insulin aspart  0-15 Units Subcutaneous TID WC  . insulin aspart  0-5 Units Subcutaneous QHS  . isosorbide mononitrate  30 mg Oral Daily  . methimazole  5 mg Oral Q M,W,F  . metoprolol tartrate  25 mg Oral BID  . potassium chloride SA  20 mEq Oral Daily  . Warfarin - Physician Dosing Inpatient   Does not apply q46    Family History Family History  Problem Relation Age of Onset  . Colon cancer Father 74    advanced when discovered, no surgery or therapy.  this led to his death  . Breast cancer Sister   . Cancer Brother   . Breast cancer Mother   . Lung cancer Brother   . Thyroid disease Neg Hx      Social History Social History   Social History  . Marital Status: Widowed    Spouse Name: N/A  . Number of Children: Y  . Years of Education: N/A   Occupational History  . retired    Social History Main Topics  . Smoking status: Passive Smoke Exposure - Never Smoker  . Smokeless tobacco: Never Used  . Alcohol Use: No  . Drug Use: No  . Sexual Activity: Not on file   Other Topics Concern  . Not on file   Social History Narrative   Originally from [place]. She has always lived in [place] & had no travel. She has significant smoke exposure through her husband for 60 years. She has no pets currently. No prior bird or mold exposure. 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 She lives in [place] with her son.     Review of Systems  General:  No chills, fever, night sweats or weight changes.  Cardiovascular:  No chest pain, dyspnea on exertion, edema, orthopnea, paroxysmal nocturnal dyspnea. +palpitations Dermatological: No rash, lesions/masses Respiratory: + dyspnea Urologic: No hematuria, dysuria Abdominal:    No nausea, vomiting, diarrhea, bright red blood per rectum, melena, or hematemesis Neurologic:  No visual changes, wkns, changes in mental status. All other systems reviewed and are otherwise negative except as noted above.  Physical Exam  Blood pressure 124/45, pulse 59, temperature 98.5 F (36.9 C), temperature source Oral, resp. rate 18, height 5\' 2"  (1.575 m), weight 131 lb 12.8 oz (59.784 kg), SpO2 100 %.  General: Pleasant, NAD Psych: Normal affect. Neuro: Alert and oriented X 3. Moves all extremities spontaneously. HEENT: Normal  Neck: Supple without bruits or JVD. Lungs:  Resp regular and unlabored. Largely CTA, minimally diminished breath sound in bilateral bases Heart: Irregularly irregular. no s3, s4, or  murmurs. Abdomen: Soft, non-tender, non-distended, BS + x 4.  Extremities: No clubbing, cyanosis or edema. DP/PT/Radials 2+ and equal bilaterally. Diffuse varicose veins  Labs  No results for input(s): CKTOTAL, CKMB, TROPONINI in the last 72 hours. Lab Results  Component Value Date   WBC 9.3 09/18/2015   HGB 8.5* 09/18/2015   HCT 25.0* 09/18/2015   MCV 77.5* 09/18/2015   PLT 473* 09/18/2015    Recent Labs Lab 09/20/15 0436  NA 140  K 4.0  CL 98*  CO2 31  BUN 56*  CREATININE 2.79*  CALCIUM 8.9  GLUCOSE 190*   Lab Results  Component Value Date   CHOL 126 12/14/2014   HDL 40* 12/14/2014   LDLCALC 72 12/14/2014   TRIG 71 12/14/2014   No results found for: DDIMER  Radiology/Studies  Dg Chest 2 View  09/19/2015  CLINICAL DATA:  Short of breath EXAM: CHEST  2 VIEW COMPARISON:  Yesterday FINDINGS: Moderate cardiomegaly. Diffuse airspace disease bilaterally has improved. Bilateral small pleural effusions are not significantly changed with associated patchy atelectasis. No pneumothorax. Stable left rib deformities. IMPRESSION: No active cardiopulmonary disease. Electronically Signed   By: Jolaine Click M.D.   On: 09/19/2015 07:44   Dg Chest 2 View  09/18/2015   CLINICAL DATA:  Worsening shortness of breath with cough. Green sputum for 3 days. EXAM: CHEST  2 VIEW COMPARISON:  08/02/2015 FINDINGS: Cardiomegaly with vascular congestion and diffuse interstitial and alveolar opacities most compatible with edema/CHF. Small bilateral effusions. Slight improvement in the edema since prior study. No acute bony abnormality. IMPRESSION: Continued CHF pattern with small bilateral pleural effusions. Slight improvement since prior study. Electronically Signed   By: Charlett Nose M.D.   On: 09/18/2015 13:41    ECG  afib with HR 60s  ASSESSMENT AND PLAN  1. Recurrent PAF on coumadin  - CHA2DS2-Vasc score 6 (female, age, HF, HTN, DM)  - she seems only has cardiac awareness when her HR is fast, she is currently not having any cardiac awareness.   - she is not a good candidate for amiodarone given her COPD/hyperthyroidism, but not sure if she is a candidate for multaq, although original study for multaq shows some contraindication for Stage III and Stage IV HF patient with EF < 35%, i am not sure if it has been studied in diastolic HF patient. Flecainide and propafanone are alternative along with tikosyn, although flecainide should be avoid in elderly female. She does not have obvious structural heart disease, no h/o CAD. Will discuss with MD regarding antiarrythmic therapy  2. Acute on chronic diastolic HF  - she is back on PO lasix. Her frequent episodes of HF is concerning, she is feeling better after diuresis only 1 lbs. Her small body size likely make her more sensitive to fluid change.   3. COPD on 3 L home O2 4. CAD stage IV 5. HTN 6. DM II  Signed, Azalee Course, PA-C 09/20/2015, 3:00 PM Patient seen and examined and no recommended as above  Patient with persistent atrial fibrillation recurrent hospitalizations for heart failure and COPD exacerbations over recent months. The issue is how his atrial fibrillation contributing. It certainly may be. There are no good  antiarrhythmic options; I would probably favor amiodarone not withstanding her lung disease. However, she has never undergone cardioversion and her initial presentation was in the context of GI bleeding potentially stress present. Hence, I would undertake cardioversion as initial therapy without antiarrhythmic drug support. I will discuss this with Dr.  Hilty.

## 2015-09-21 ENCOUNTER — Observation Stay (HOSPITAL_COMMUNITY): Payer: Medicare Other

## 2015-09-21 DIAGNOSIS — I481 Persistent atrial fibrillation: Secondary | ICD-10-CM

## 2015-09-21 DIAGNOSIS — I5033 Acute on chronic diastolic (congestive) heart failure: Secondary | ICD-10-CM | POA: Diagnosis not present

## 2015-09-21 DIAGNOSIS — D649 Anemia, unspecified: Secondary | ICD-10-CM | POA: Diagnosis not present

## 2015-09-21 DIAGNOSIS — I5031 Acute diastolic (congestive) heart failure: Secondary | ICD-10-CM | POA: Diagnosis not present

## 2015-09-21 DIAGNOSIS — I5032 Chronic diastolic (congestive) heart failure: Secondary | ICD-10-CM | POA: Diagnosis not present

## 2015-09-21 LAB — CBC
HCT: 23.3 % — ABNORMAL LOW (ref 36.0–46.0)
HEMATOCRIT: 22.2 % — AB (ref 36.0–46.0)
HEMATOCRIT: 25.4 % — AB (ref 36.0–46.0)
HEMOGLOBIN: 6.8 g/dL — AB (ref 12.0–15.0)
Hemoglobin: 7.1 g/dL — ABNORMAL LOW (ref 12.0–15.0)
Hemoglobin: 7.8 g/dL — ABNORMAL LOW (ref 12.0–15.0)
MCH: 24.2 pg — AB (ref 26.0–34.0)
MCH: 24.1 pg — AB (ref 26.0–34.0)
MCH: 24.8 pg — ABNORMAL LOW (ref 26.0–34.0)
MCHC: 30.6 g/dL (ref 30.0–36.0)
MCHC: 30.5 g/dL (ref 30.0–36.0)
MCHC: 30.7 g/dL (ref 30.0–36.0)
MCV: 79 fL (ref 78.0–100.0)
MCV: 79.3 fL (ref 78.0–100.0)
MCV: 80.9 fL (ref 78.0–100.0)
PLATELETS: 379 10*3/uL (ref 150–400)
Platelets: 414 10*3/uL — ABNORMAL HIGH (ref 150–400)
Platelets: 376 10*3/uL (ref 150–400)
RBC: 2.81 MIL/uL — AB (ref 3.87–5.11)
RBC: 2.94 MIL/uL — AB (ref 3.87–5.11)
RBC: 3.14 MIL/uL — ABNORMAL LOW (ref 3.87–5.11)
RDW: 18.7 % — ABNORMAL HIGH (ref 11.5–15.5)
RDW: 18.8 % — ABNORMAL HIGH (ref 11.5–15.5)
RDW: 18.2 % — AB (ref 11.5–15.5)
WBC: 4.7 10*3/uL (ref 4.0–10.5)
WBC: 4.8 10*3/uL (ref 4.0–10.5)
WBC: 5.1 10*3/uL (ref 4.0–10.5)

## 2015-09-21 LAB — BASIC METABOLIC PANEL
ANION GAP: 8 (ref 5–15)
BUN: 59 mg/dL — ABNORMAL HIGH (ref 6–20)
CALCIUM: 8.8 mg/dL — AB (ref 8.9–10.3)
CO2: 34 mmol/L — AB (ref 22–32)
Chloride: 99 mmol/L — ABNORMAL LOW (ref 101–111)
Creatinine, Ser: 2.77 mg/dL — ABNORMAL HIGH (ref 0.44–1.00)
GFR, EST AFRICAN AMERICAN: 17 mL/min — AB (ref >60)
GFR, EST NON AFRICAN AMERICAN: 15 mL/min — AB (ref >60)
GLUCOSE: 75 mg/dL (ref 65–99)
POTASSIUM: 4.2 mmol/L (ref 3.5–5.1)
Sodium: 141 mmol/L (ref 135–145)

## 2015-09-21 LAB — GLUCOSE, CAPILLARY
GLUCOSE-CAPILLARY: 65 mg/dL (ref 65–99)
GLUCOSE-CAPILLARY: 105 mg/dL — AB (ref 65–99)
Glucose-Capillary: 109 mg/dL — ABNORMAL HIGH (ref 65–99)
Glucose-Capillary: 154 mg/dL — ABNORMAL HIGH (ref 65–99)
Glucose-Capillary: 241 mg/dL — ABNORMAL HIGH (ref 65–99)

## 2015-09-21 LAB — FERRITIN: Ferritin: 66 ng/mL (ref 11–307)

## 2015-09-21 LAB — T4, FREE: Free T4: 1.23 ng/dL — ABNORMAL HIGH (ref 0.61–1.12)

## 2015-09-21 LAB — RETICULOCYTES
RBC.: 2.94 MIL/uL — ABNORMAL LOW (ref 3.87–5.11)
RETIC CT PCT: 2.5 % (ref 0.4–3.1)
Retic Count, Absolute: 73.5 10*3/uL (ref 19.0–186.0)

## 2015-09-21 LAB — FOLATE: Folate: 11.5 ng/mL (ref >5.9)

## 2015-09-21 LAB — IRON AND TIBC
Iron: 23 ug/dL — ABNORMAL LOW (ref 28–170)
Saturation Ratios: 7 % — ABNORMAL LOW (ref 10.4–31.8)
TIBC: 344 ug/dL (ref 250–450)
UIBC: 321 ug/dL

## 2015-09-21 LAB — TSH: TSH: 1.387 u[IU]/mL (ref 0.350–4.500)

## 2015-09-21 LAB — PROTIME-INR
INR: 2.81 — ABNORMAL HIGH (ref 0.00–1.49)
Prothrombin Time: 29.2 seconds — ABNORMAL HIGH (ref 11.6–15.2)

## 2015-09-21 LAB — HEMOGLOBIN A1C
Hgb A1c MFr Bld: 8.5 % — ABNORMAL HIGH (ref 4.8–5.6)
Mean Plasma Glucose: 197 mg/dL

## 2015-09-21 LAB — PREPARE RBC (CROSSMATCH)

## 2015-09-21 LAB — VITAMIN B12: VITAMIN B 12: 643 pg/mL (ref 180–914)

## 2015-09-21 MED ORDER — SODIUM CHLORIDE 0.9 % IV SOLN
Freq: Once | INTRAVENOUS | Status: AC
  Administered 2015-09-21: 12:00:00 via INTRAVENOUS

## 2015-09-21 MED ORDER — SODIUM CHLORIDE 0.9% FLUSH
3.0000 mL | Freq: Two times a day (BID) | INTRAVENOUS | Status: DC
  Administered 2015-09-21 – 2015-09-22 (×3): 3 mL via INTRAVENOUS

## 2015-09-21 MED ORDER — FUROSEMIDE 10 MG/ML IJ SOLN
20.0000 mg | Freq: Once | INTRAMUSCULAR | Status: AC
  Administered 2015-09-21: 20 mg via INTRAVENOUS
  Filled 2015-09-21: qty 2

## 2015-09-21 MED ORDER — POLYSACCHARIDE IRON COMPLEX 150 MG PO CAPS
150.0000 mg | ORAL_CAPSULE | Freq: Every day | ORAL | Status: DC
  Administered 2015-09-21 – 2015-09-22 (×2): 150 mg via ORAL
  Filled 2015-09-21 (×2): qty 1

## 2015-09-21 MED ORDER — WARFARIN SODIUM 2 MG PO TABS
2.0000 mg | ORAL_TABLET | Freq: Once | ORAL | Status: AC
  Administered 2015-09-21: 2 mg via ORAL
  Filled 2015-09-21: qty 1

## 2015-09-21 MED ORDER — METHIMAZOLE 10 MG PO TABS
5.0000 mg | ORAL_TABLET | Freq: Every day | ORAL | Status: DC
  Administered 2015-09-22: 5 mg via ORAL
  Filled 2015-09-21: qty 0.5

## 2015-09-21 MED ORDER — SODIUM CHLORIDE 0.9 % IV SOLN: 250.0000 mL | INTRAVENOUS | Status: DC

## 2015-09-21 MED ORDER — SODIUM CHLORIDE 0.9% FLUSH: 3.0000 mL | INTRAVENOUS | Status: DC | PRN

## 2015-09-21 MED ORDER — DOCUSATE SODIUM 100 MG PO CAPS
100.0000 mg | ORAL_CAPSULE | Freq: Two times a day (BID) | ORAL | Status: DC
  Administered 2015-09-21 – 2015-09-22 (×2): 100 mg via ORAL
  Filled 2015-09-21 (×3): qty 1

## 2015-09-21 MED ORDER — WARFARIN - PHARMACIST DOSING INPATIENT
Freq: Every day | Status: DC
  Administered 2015-09-21: 1

## 2015-09-21 NOTE — Progress Notes (Signed)
CBG 65. Pt alert and stable. Rn will give orange juice and recheck.

## 2015-09-21 NOTE — Progress Notes (Signed)
Pt. With Hb of 6.8 this am. On NP, K. Craige Cotta made aware via text page.

## 2015-09-21 NOTE — Progress Notes (Signed)
DAILY PROGRESS NOTE  Subjective:  Breathing is better. 1.2L negative since admission. Creatinine stable. H/H low at 7/23. INR is drifting down. Appreciate EP recs - suggesting possible cardioversion since she remains in a-fib. TSH normal, however free T4 is slightly high. T3 pending.  Objective:  Temp:  [97.6 F (36.4 C)-98.5 F (36.9 C)] 97.6 F (36.4 C) (04/27 0524) Pulse Rate:  [52-74] 52 (04/27 0524) Resp:  [18] 18 (04/27 0524) BP: (110-133)/(39-49) 133/49 mmHg (04/27 0524) SpO2:  [100 %] 100 % (04/27 0524) Weight:  [132 lb 3.2 oz (59.966 kg)] 132 lb 3.2 oz (59.966 kg) (04/27 0524) Weight change: 6.4 oz (0.181 kg)  Intake/Output from previous day: 04/26 0701 - 04/27 0700 In: 935 [P.O.:935] Out: 1475 [Urine:1475]  Intake/Output from this shift: Total I/O In: 220 [P.O.:220] Out: 200 [Urine:200]  Medications: Current Facility-Administered Medications  Medication Dose Route Frequency Provider Last Rate Last Dose  . 0.9 %  sodium chloride infusion   Intravenous Once Ripudeep K Rai, MD      . acetaminophen (TYLENOL) tablet 650 mg  650 mg Oral Q4H PRN Rondel Jumbo, PA-C      . atorvastatin (LIPITOR) tablet 20 mg  20 mg Oral QHS Rondel Jumbo, PA-C   20 mg at 09/20/15 2212  . escitalopram (LEXAPRO) tablet 10 mg  10 mg Oral Daily Rondel Jumbo, PA-C   10 mg at 09/20/15 1033  . furosemide (LASIX) tablet 80 mg  80 mg Oral BID Thurnell Lose, MD   80 mg at 09/21/15 0846  . gabapentin (NEURONTIN) capsule 100 mg  100 mg Oral QHS Rondel Jumbo, PA-C   100 mg at 09/20/15 2212  . glipiZIDE (GLUCOTROL) tablet 5 mg  5 mg Oral QAC breakfast Thurnell Lose, MD   5 mg at 09/20/15 0612  . hydrALAZINE (APRESOLINE) tablet 25 mg  25 mg Oral Q12H Thurnell Lose, MD   25 mg at 09/20/15 2212  . insulin aspart (novoLOG) injection 0-15 Units  0-15 Units Subcutaneous TID WC Ripudeep Krystal Eaton, MD   2 Units at 09/20/15 1632  . insulin aspart (novoLOG) injection 0-5 Units  0-5 Units  Subcutaneous QHS Ripudeep K Rai, MD   0 Units at 09/20/15 2200  . isosorbide mononitrate (IMDUR) 24 hr tablet 30 mg  30 mg Oral Daily Thurnell Lose, MD   30 mg at 09/20/15 1019  . methimazole (TAPAZOLE) tablet 5 mg  5 mg Oral Q M,W,F Ripudeep K Rai, MD   5 mg at 09/20/15 1032  . metoprolol tartrate (LOPRESSOR) tablet 25 mg  25 mg Oral BID Rondel Jumbo, PA-C   25 mg at 09/20/15 2212  . ondansetron (ZOFRAN) injection 4 mg  4 mg Intravenous Q6H PRN Rondel Jumbo, PA-C      . potassium chloride SA (K-DUR,KLOR-CON) CR tablet 20 mEq  20 mEq Oral Daily Rondel Jumbo, PA-C   20 mEq at 09/20/15 1020  . Warfarin - Physician Dosing Inpatient   Does not apply [place]   Stopped at 09/19/15 1800    Physical Exam: General appearance: alert and no distress Neck: no carotid bruit and no JVD Lungs: clear to auscultation bilaterally Heart: irregularly irregular rhythm Extremities: extremities normal, atraumatic, no cyanosis or edema Neurologic: Grossly normal  Lab Results: Results for orders placed or performed during the hospital encounter of 09/18/15 (from the past 48 hour(s))  Glucose, capillary     Status: Abnormal  Collection Time: 09/19/15 11:52 AM  Result Value Ref Range   Glucose-Capillary 155 (H) 65 - 99 mg/dL  Glucose, capillary     Status: Abnormal   Collection Time: 09/19/15  4:28 PM  Result Value Ref Range   Glucose-Capillary 216 (H) 65 - 99 mg/dL  Glucose, capillary     Status: Abnormal   Collection Time: 09/19/15  7:57 PM  Result Value Ref Range   Glucose-Capillary 286 (H) 65 - 99 mg/dL  Protime-INR     Status: Abnormal   Collection Time: 09/20/15  4:36 AM  Result Value Ref Range   Prothrombin Time 34.3 (H) 11.6 - 15.2 seconds   INR 3.49 (H) 0.00 - 0.38  Basic metabolic panel     Status: Abnormal   Collection Time: 09/20/15  4:36 AM  Result Value Ref Range   Sodium 140 135 - 145 mmol/L   Potassium 4.0 3.5 - 5.1 mmol/L   Chloride 98 (L) 101 - 111  mmol/L   CO2 31 22 - 32 mmol/L   Glucose, Bld 190 (H) 65 - 99 mg/dL   BUN 56 (H) 6 - 20 mg/dL   Creatinine, Ser 2.79 (H) 0.44 - 1.00 mg/dL   Calcium 8.9 8.9 - 10.3 mg/dL   GFR calc non Af Amer 15 (L) >60 mL/min   GFR calc Af Amer 17 (L) >60 mL/min    Comment: (NOTE) The eGFR has been calculated using the CKD EPI equation. This calculation has not been validated in all clinical situations. eGFR's persistently <60 mL/min signify possible Chronic Kidney Disease.    Anion gap 11 5 - 15  Glucose, capillary     Status: Abnormal   Collection Time: 09/20/15  5:36 AM  Result Value Ref Range   Glucose-Capillary 156 (H) 65 - 99 mg/dL   Comment 1 Notify RN    Comment 2 Document in Chart   Glucose, capillary     Status: Abnormal   Collection Time: 09/20/15 11:26 AM  Result Value Ref Range   Glucose-Capillary 123 (H) 65 - 99 mg/dL  Hemoglobin A1c     Status: Abnormal   Collection Time: 09/20/15 12:57 PM  Result Value Ref Range   Hgb A1c MFr Bld 8.5 (H) 4.8 - 5.6 %    Comment: (NOTE)         Pre-diabetes: 5.7 - 6.4         Diabetes: >6.4         Glycemic control for adults with diabetes: <7.0    Mean Plasma Glucose 197 mg/dL    Comment: (NOTE) Performed At: [facility] [address] [place], [place] [id number] Lindon Romp MD ZP:[id number]   Glucose, capillary     Status: Abnormal   Collection Time: 09/20/15  4:30 PM  Result Value Ref Range   Glucose-Capillary 133 (H) 65 - 99 mg/dL  Glucose, capillary     Status: Abnormal   Collection Time: 09/20/15  8:47 PM  Result Value Ref Range   Glucose-Capillary 159 (H) 65 - 99 mg/dL  Protime-INR     Status: Abnormal   Collection Time: 09/21/15  4:02 AM  Result Value Ref Range   Prothrombin Time 29.2 (H) 11.6 - 15.2 seconds   INR 2.81 (H) 0.00 - 8.01  Basic metabolic panel     Status: Abnormal   Collection Time: 09/21/15  4:02 AM  Result Value Ref Range   Sodium 141 135 - 145 mmol/L   Potassium 4.2 3.5 - 5.1 mmol/L  Chloride 99 (L) 101 - 111 mmol/L   CO2 34 (H) 22 - 32 mmol/L   Glucose, Bld 75 65 - 99 mg/dL   BUN 59 (H) 6 - 20 mg/dL   Creatinine, Ser 2.77 (H) 0.44 - 1.00 mg/dL   Calcium 8.8 (L) 8.9 - 10.3 mg/dL   GFR calc non Af Amer 15 (L) >60 mL/min   GFR calc Af Amer 17 (L) >60 mL/min    Comment: (NOTE) The eGFR has been calculated using the CKD EPI equation. This calculation has not been validated in all clinical situations. eGFR's persistently <60 mL/min signify possible Chronic Kidney Disease.    Anion gap 8 5 - 15  CBC     Status: Abnormal   Collection Time: 09/21/15  4:02 AM  Result Value Ref Range   WBC 4.7 4.0 - 10.5 K/uL   RBC 2.81 (L) 3.87 - 5.11 MIL/uL   Hemoglobin 6.8 (LL) 12.0 - 15.0 g/dL    Comment: REPEATED TO VERIFY CRITICAL RESULT CALLED TO, READ BACK BY AND VERIFIED WITH: K.WALL,RN 2951 09/21/15 M.CAMPBELL    HCT 22.2 (L) 36.0 - 46.0 %   MCV 79.0 78.0 - 100.0 fL   MCH 24.2 (L) 26.0 - 34.0 pg   MCHC 30.6 30.0 - 36.0 g/dL   RDW 18.7 (H) 11.5 - 15.5 %   Platelets 414 (H) 150 - 400 K/uL  TSH     Status: None   Collection Time: 09/21/15  4:02 AM  Result Value Ref Range   TSH 1.387 0.350 - 4.500 uIU/mL  T4, free     Status: Abnormal   Collection Time: 09/21/15  4:02 AM  Result Value Ref Range   Free T4 1.23 (H) 0.61 - 1.12 ng/dL  CBC     Status: Abnormal   Collection Time: 09/21/15  6:42 AM  Result Value Ref Range   WBC 4.8 4.0 - 10.5 K/uL   RBC 2.94 (L) 3.87 - 5.11 MIL/uL   Hemoglobin 7.1 (L) 12.0 - 15.0 g/dL   HCT 23.3 (L) 36.0 - 46.0 %   MCV 79.3 78.0 - 100.0 fL   MCH 24.1 (L) 26.0 - 34.0 pg   MCHC 30.5 30.0 - 36.0 g/dL   RDW 18.8 (H) 11.5 - 15.5 %   Platelets 379 150 - 400 K/uL  Vitamin B12     Status: None   Collection Time: 09/21/15  6:42 AM  Result Value Ref Range   Vitamin B-12 643 180 - 914 pg/mL    Comment: (NOTE) This assay is not validated for testing neonatal or myeloproliferative syndrome specimens for Vitamin B12 levels.   Folate     Status:  None   Collection Time: 09/21/15  6:42 AM  Result Value Ref Range   Folate 11.5 >5.9 ng/mL  Iron and TIBC     Status: Abnormal   Collection Time: 09/21/15  6:42 AM  Result Value Ref Range   Iron 23 (L) 28 - 170 ug/dL   TIBC 344 250 - 450 ug/dL   Saturation Ratios 7 (L) 10.4 - 31.8 %   UIBC 321 ug/dL  Ferritin     Status: None   Collection Time: 09/21/15  6:42 AM  Result Value Ref Range   Ferritin 66 11 - 307 ng/mL  Reticulocytes     Status: Abnormal   Collection Time: 09/21/15  6:42 AM  Result Value Ref Range   Retic Ct Pct 2.5 0.4 - 3.1 %   RBC. 2.94 (L) 3.87 -  5.11 MIL/uL   Retic Count, Manual 73.5 19.0 - 186.0 K/uL  Glucose, capillary     Status: None   Collection Time: 09/21/15  6:58 AM  Result Value Ref Range   Glucose-Capillary 65 65 - 99 mg/dL  Glucose, capillary     Status: Abnormal   Collection Time: 09/21/15  7:48 AM  Result Value Ref Range   Glucose-Capillary 105 (H) 65 - 99 mg/dL  Prepare RBC     Status: None   Collection Time: 09/21/15  8:30 AM  Result Value Ref Range   Order Confirmation ORDER PROCESSED BY BLOOD BANK     Imaging: No results found.  Assessment:  1. Principal Problem: 2.   CHF exacerbation ([place]) 3. Active Problems: 4.   Chronic diastolic heart failure, NYHA class 1 ([place]) 5.   Normocytic anemia 6.   Pressure ulcer 7.   Shortness of breath 8.   Anemia 9.   Diabetes ([place]) 10.   Absolute anemia 11.   Diabetes mellitus with complication ([place]) 12.   Simple chronic bronchitis ([place]) 13.   Type II diabetes mellitus ([place]) 14.   Hypertensive heart disease 15.   Atrial fibrillation, persistent ([place]) 16.   Plan:  1. Limited echo today. Continue IV diuresis. Labs pending today. Iron level improved but still low, however, she remains anemic. Start nu-iron 150 mg daily. Consider stool softer such as colace, except, she has a yellow dye allergy. May benefit from cardioversion. INR has been supratherapeutic for >1 month, therefore would not need  TEE. Will try to arrange for tomorrow. Increase tapazole to daily. NPO p MN.  Time Spent Directly with Patient:  15 minutes  Length of Stay:    Pixie Casino, MD, [facility] Attending Cardiologist [place] 09/21/2015, 9:56 AM

## 2015-09-21 NOTE — Progress Notes (Signed)
[facility]                                                                              Patient Demographics  Alexis [name], is a 82 y.o. female, DOB - [birth year], YQM:[id number]  Admit date - 09/18/2015   Admitting Physician Ozella Rocks, MD  Outpatient Primary MD for the patient is [name], PA-C  Outpatient specialists:   LOS -   days    Chief Complaint  Patient presents with  . COPD  . Shortness of Breath       Brief summary   Patient is a 82 year old female with COPD, diabetes, atrial fibrillation, hypertension, CHF presented with shortness of breath for 3 days prior to admission, no fevers or chills or any productive cough, or chest pain. In ED, hemoglobin 7.5, Hemoccult negative, received a nebulizer treatment withan improvement. Glucose 355, creatinine 2.63. Chest x-ray showed small pleural effusions bilaterally with pulmonary vascular congestion. EKG showed atrial fibrillation initially with rapid ventricular response. Patient was admitted for further workup.   Assessment & Plan    Principal Problem:    Acute hypoxic resp failure due to acute on chronic diastolic CHF.  - 2-D echo 1/17 showed EF of 55-60%  - Patient presented with pulmonary edema, placed on IV Lasix - Negative balance of 1.17 L. Cardiology following.    Underlying COPD. No wheezing. Supportive Care.  Essential hypertension. On hydralazine and beta blocker continue.  Dyslipidemia. Continue statin.  Recurrent paroxysmal atrial fibrillation/tachycardia palpitations.  - CHADS VASC score 6 - Currently rate controlled, continue Coumadin per pharmacy - EP cardiology following, not a good candidate for amiodarone due to pulmonary issues, also possible cardioversion, will follow recommendations. Cardioversion in a.m. 4/28  CKD stage V. Baseline creatinine of 2.5.  - Creatinine stable   Anemia of chronic disease: Anemia panel inconclusive. - Hemoglobin this morning 6.8,  Repeat hemoglobin 7.1, no obvious GI bleeding - Stool occult test pending however negative on 4/24 - Anemia panel also shows iron deficiency along with anemia of chronic disease - Obtain CT abdomen to rule out RPH  Hyperthyroidism.  - TSH 1.38 Follow TSH, continue Tapazole for now  DM2 - on ISS , Resume home dose Glucotrol - Follow hemoglobin A1c, blood sugar  Uncontrolled, increase sliding scale insulin to moderate, place on current modified diet. On glipizide  Stage 2 Pressure Injury left inner gluteal cleft - Per wound care recommendations  Code Status: DO NOT RESUSCITATE  Family Communication: Discussed in detail with the patient, all imaging results, lab results explained to the patient   Disposition Plan:   Time Spent in minutes   25 minutes  Procedures  None  Consults   Cardiology  DVT Prophylaxis  warfarin  Medications  Scheduled Meds: . sodium chloride   Intravenous Once  . atorvastatin  20 mg Oral QHS  . escitalopram  10 mg Oral Daily  . furosemide  80 mg Oral BID  . gabapentin  100 mg Oral QHS  . glipiZIDE  5 mg Oral QAC breakfast  . hydrALAZINE  25 mg Oral Q12H  . insulin aspart  0-15 Units Subcutaneous TID  WC  . insulin aspart  0-5 Units Subcutaneous QHS  . iron polysaccharides  150 mg Oral Daily  . isosorbide mononitrate  30 mg Oral Daily  . [START ON 09/22/2015] methimazole  5 mg Oral Daily  . metoprolol tartrate  25 mg Oral BID  . potassium chloride SA  20 mEq Oral Daily  . sodium chloride flush  3 mL Intravenous Q12H  . Warfarin - Physician Dosing Inpatient   Does not apply q1800   Continuous Infusions: . sodium chloride     PRN Meds:.acetaminophen, ondansetron (ZOFRAN) IV, sodium chloride flush   Antibiotics   Anti-infectives    None        Subjective:   Alexis [name] was seen and examined today.   Patient denies dizziness, abdominal pain, N/V/D/C, new weakness, numbess, tingling. No acute events overnight.    Objective:    Filed Vitals:   09/20/15 2049 09/20/15 2200 09/21/15 0524 09/21/15 1051  BP: 110/39 116/46 133/49   Pulse: 65 74 52 63  Temp: 97.9 F (36.6 C)  97.6 F (36.4 C)   TempSrc: Oral  Oral   Resp: 18  18   Height:      Weight:   59.966 kg (132 lb 3.2 oz)   SpO2: 100%  100%     Intake/Output Summary (Last 24 hours) at 09/21/15 1115 Last data filed at 09/21/15 0900  Gross per 24 hour  Intake    680 ml  Output   1175 ml  Net   -495 ml     Wt Readings from Last 3 Encounters:  09/21/15 59.966 kg (132 lb 3.2 oz)  08/18/15 61.644 kg (135 lb 14.4 oz)  08/04/15 62 kg (136 lb 11 oz)     Exam  General: Alert and oriented, NAD  HEENT:   Neck: Supple, no JVD, no masses  CVS: S1 S2 auscultated, no rubs, murmurs or gallops. Regular rate and rhythm.  Respiratory: Decreased breath sounds at the bases  Abdomen: Soft, nontender, nondistended, + bowel sounds  Ext: no cyanosis clubbing, trace edema  Neuro:No new deficits   Skin: No rashes  Psych: Normal affect and demeanor, alert and oriented x3    Data Reviewed:  I have personally reviewed following labs and imaging studies  Micro Results No results found for this or any previous visit (from the past 240 hour(s)).  Radiology Reports Dg Chest 2 View  09/19/2015  CLINICAL DATA:  Short of breath EXAM: CHEST  2 VIEW COMPARISON:  Yesterday FINDINGS: Moderate cardiomegaly. Diffuse airspace disease bilaterally has improved. Bilateral small pleural effusions are not significantly changed with associated patchy atelectasis. No pneumothorax. Stable left rib deformities. IMPRESSION: No active cardiopulmonary disease. Electronically Signed   By: Jolaine Click M.D.   On: 09/19/2015 07:44   Dg Chest 2 View  09/18/2015  CLINICAL DATA:  Worsening shortness of breath with cough. Green sputum for 3 days. EXAM: CHEST  2 VIEW COMPARISON:  08/02/2015 FINDINGS: Cardiomegaly with vascular congestion and diffuse interstitial and alveolar opacities  most compatible with edema/CHF. Small bilateral effusions. Slight improvement in the edema since prior study. No acute bony abnormality. IMPRESSION: Continued CHF pattern with small bilateral pleural effusions. Slight improvement since prior study. Electronically Signed   By: Charlett Nose M.D.   On: 09/18/2015 13:41    CBC  Recent Labs Lab 09/18/15 1220 09/18/15 1602 09/21/15 0402 09/21/15 0642  WBC 9.3  --  4.7 4.8  HGB 7.5* 8.5* 6.8* 7.1*  HCT 25.1* 25.0* 22.2*  23.3*  PLT 473*  --  414* 379  MCV 77.5*  --  79.0 79.3  MCH 23.1*  --  24.2* 24.1*  MCHC 29.9*  --  30.6 30.5  RDW 19.7*  --  18.7* 18.8*  LYMPHSABS 0.6*  --   --   --   MONOABS 0.5  --   --   --   EOSABS 0.1  --   --   --   BASOSABS 0.0  --   --   --     Chemistries   Recent Labs Lab 09/18/15 1533 09/18/15 1602 09/19/15 0355 09/20/15 0436 09/21/15 0402  NA 137 137 137 140 141  K 3.6 3.6 3.6 4.0 4.2  CL 96* 95* 96* 98* 99*  CO2 27  --  30 31 34*  GLUCOSE 355* 351* 337* 190* 75  BUN 45* 43* 45* 56* 59*  CREATININE 2.63* 2.40* 2.55* 2.79* 2.77*  CALCIUM 8.7*  --  8.8* 8.9 8.8*   ------------------------------------------------------------------------------------------------------------------ estimated creatinine clearance is 12.6 mL/min (by C-G formula based on Cr of 2.77). ------------------------------------------------------------------------------------------------------------------  Recent Labs  09/20/15 1257  HGBA1C 8.5*   ------------------------------------------------------------------------------------------------------------------ No results for input(s): CHOL, HDL, LDLCALC, TRIG, CHOLHDL, LDLDIRECT in the last 72 hours. ------------------------------------------------------------------------------------------------------------------  Recent Labs  09/21/15 0402  TSH 1.387    ------------------------------------------------------------------------------------------------------------------  Recent Labs  09/18/15 1906 09/21/15 0642  VITAMINB12 690 643  FOLATE 18.7 11.5  FERRITIN 44 66  TIBC 353 344  IRON 13* 23*  RETICCTPCT 2.1 2.5    Coagulation profile  Recent Labs Lab 09/18/15 1220 09/19/15 0904 09/20/15 0436 09/21/15 0402  INR 2.77* 4.22* 3.49* 2.81*    No results for input(s): DDIMER in the last 72 hours.  Cardiac Enzymes No results for input(s): CKMB, TROPONINI, MYOGLOBIN in the last 168 hours.  Invalid input(s): CK ------------------------------------------------------------------------------------------------------------------ Invalid input(s): POCBNP   Recent Labs  09/20/15 0536 09/20/15 1126 09/20/15 1630 09/20/15 2047 09/21/15 0658 09/21/15 0748  GLUCAP 156* 123* 133* 159* 65 105*     Nelline Lio M.D. [facility] 09/21/2015, 11:15 AM  Pager: [phone] Between 7am to 7pm - call Pager - [phone]  After 7pm go to [email or web address] - password [email or web address]  Call night coverage person covering after 7pm

## 2015-09-21 NOTE — Progress Notes (Addendum)
ANTICOAGULATION CONSULT NOTE - Initial Consult  Pharmacy Consult for warfarin Indication: atrial fibrillation  Allergies  Allergen Reactions  . Yellow Dyes (Non-Tartrazine) Nausea And Vomiting    "deathly allergic" No other information given to explain reaction. Per Daughter- this was an IV dye. Ok with yellow coloring on Coumadin tablets.   . Codeine Hives  . Other Other (See Comments)    novocaine broke mouth out  . Penicillins Hives  . Procaine Hives    Patient Measurements: Height: 5\' 2"  (157.5 cm) Weight: 132 lb 3.2 oz (59.966 kg) (scale b) IBW/kg (Calculated) : 50.1  Vital Signs: Temp: 98.6 F (37 C) (04/27 1215) Temp Source: Oral (04/27 1215) BP: 116/62 mmHg (04/27 1215) Pulse Rate: 63 (04/27 1215)  Labs:  Recent Labs  09/18/15 1602 09/19/15 0355 09/19/15 0904 09/20/15 0436 09/21/15 0402 09/21/15 0642  HGB 8.5*  --   --   --  6.8* 7.1*  HCT 25.0*  --   --   --  22.2* 23.3*  PLT  --   --   --   --  414* 379  LABPROT  --   --  39.5* 34.3* 29.2*  --   INR  --   --  4.22* 3.49* 2.81*  --   CREATININE 2.40* 2.55*  --  2.79* 2.77*  --     Estimated Creatinine Clearance: 12.6 mL/min (by C-G formula based on Cr of 2.77).   Medical History: Past Medical History  Diagnosis Date  . Hypertensive heart disease   . Neuropathy (HCC)   . Anxiety   . PONV (postoperative nausea and vomiting)   . Chronic diastolic CHF (congestive heart failure) (HCC)     a. 05/2015 Echo: EF 55-60%, mild LVH, no rwma, mild MR, mildly dil RA, mod TR, PASP .  Marland Kitchen COPD (chronic obstructive pulmonary disease) (HCC)     a. on home O2 @ 3lpm.  . Family history of adverse reaction to anesthesia   . Type II diabetes mellitus (HCC)   . Macular degeneration, bilateral   . Varicose veins   . GIB (gastrointestinal bleeding)     a. History of GIB.  Marland Kitchen Paroxysmal atrial fibrillation (HCC)     a. CHA2DS2VASc = 6-->coumadin.  . Confusion state   . CKD (chronic kidney disease), stage IV  (HCC)   . Elevated troponin     a. H/o elevated trop in setting of CHF; b. 06/2013 Lexiscan MV: nl EF, mild anterior breast attenuation, no ischemia.     Assessment: 82 yo female on warfarin PTA for AFib, pharmacy consulted to dose.  Home dose warfarin 2.5 mg daily (per anticoag visit 4/14 -reduced from 18.75 mg/wk to 17.5 mg/wk) PO intake is good. INR 2.77>4.22>3.49. Hgb 7.1, Plt 379 No dose x 2 days, will restart today as INR is back in range  DDI: methimazole PTA TIW, cardiology increased to daily while inpatient   Goal of Therapy:  INR 2-3 Monitor platelets by anticoagulation protocol: Yes   Plan:  Give warfarin 2 mg po x 1 Monitor daily INR, CBC, clinical course, s/sx of bleed, PO intake, DDI   Thank you for allowing [place] to participate in this patients care. Signe Colt, PharmD Pager: [phone]  09/21/2015,1:07 PM

## 2015-09-21 NOTE — Progress Notes (Signed)
Inpatient Diabetes Program Recommendations  AACE/ADA: New Consensus Statement on Inpatient Glycemic Control (2015)  Target Ranges:  Prepandial:   less than 140 mg/dL      Peak postprandial:   less than 180 mg/dL (1-2 hours)      Critically ill patients:  140 - 180 mg/dL   Review of Glycemic Control  Inpatient Diabetes Program Recommendations:  Correction (SSI): Decrease Novolog to sensitive scale  Thank you  Piedad Climes BSN, RN,CDE Inpatient Diabetes Coordinator [phone] (team pager)

## 2015-09-22 ENCOUNTER — Observation Stay (HOSPITAL_COMMUNITY): Payer: Medicare Other | Admitting: Anesthesiology

## 2015-09-22 ENCOUNTER — Telehealth: Payer: Self-pay | Admitting: Physician Assistant

## 2015-09-22 ENCOUNTER — Encounter (HOSPITAL_COMMUNITY): Payer: Self-pay | Admitting: Certified Registered Nurse Anesthetist

## 2015-09-22 ENCOUNTER — Encounter (HOSPITAL_COMMUNITY)
Admission: EM | Disposition: A | Discharge status: Home / Self Care | Payer: Self-pay | Source: Home / Self Care | Attending: Emergency Medicine

## 2015-09-22 DIAGNOSIS — J9621 Acute and chronic respiratory failure with hypoxia: Secondary | ICD-10-CM | POA: Diagnosis not present

## 2015-09-22 DIAGNOSIS — I5033 Acute on chronic diastolic (congestive) heart failure: Secondary | ICD-10-CM | POA: Diagnosis not present

## 2015-09-22 DIAGNOSIS — I5032 Chronic diastolic (congestive) heart failure: Secondary | ICD-10-CM | POA: Diagnosis not present

## 2015-09-22 DIAGNOSIS — I480 Paroxysmal atrial fibrillation: Secondary | ICD-10-CM | POA: Diagnosis not present

## 2015-09-22 DIAGNOSIS — D649 Anemia, unspecified: Secondary | ICD-10-CM | POA: Diagnosis not present

## 2015-09-22 DIAGNOSIS — I132 Hypertensive heart and chronic kidney disease with heart failure and with stage 5 chronic kidney disease, or end stage renal disease: Secondary | ICD-10-CM | POA: Diagnosis not present

## 2015-09-22 DIAGNOSIS — I481 Persistent atrial fibrillation: Secondary | ICD-10-CM | POA: Diagnosis not present

## 2015-09-22 DIAGNOSIS — Z880 Allergy status to penicillin: Secondary | ICD-10-CM | POA: Diagnosis not present

## 2015-09-22 HISTORY — PX: CARDIOVERSION: SHX1299

## 2015-09-22 LAB — CBC
HEMATOCRIT: 25.1 % — AB (ref 36.0–46.0)
Hemoglobin: 7.6 g/dL — ABNORMAL LOW (ref 12.0–15.0)
MCH: 23.9 pg — ABNORMAL LOW (ref 26.0–34.0)
MCHC: 30.3 g/dL (ref 30.0–36.0)
MCV: 78.9 fL (ref 78.0–100.0)
PLATELETS: 358 10*3/uL (ref 150–400)
RBC: 3.18 MIL/uL — AB (ref 3.87–5.11)
RDW: 18.4 % — ABNORMAL HIGH (ref 11.5–15.5)
WBC: 4.9 10*3/uL (ref 4.0–10.5)

## 2015-09-22 LAB — TYPE AND SCREEN
ABO/RH(D): O POS
ANTIBODY SCREEN: NEGATIVE
UNIT DIVISION: 0

## 2015-09-22 LAB — BASIC METABOLIC PANEL
Anion gap: 11 (ref 5–15)
BUN: 59 mg/dL — ABNORMAL HIGH (ref 6–20)
CHLORIDE: 99 mmol/L — AB (ref 101–111)
CO2: 32 mmol/L (ref 22–32)
CREATININE: 2.87 mg/dL — AB (ref 0.44–1.00)
Calcium: 8.6 mg/dL — ABNORMAL LOW (ref 8.9–10.3)
GFR calc non Af Amer: 14 mL/min — ABNORMAL LOW (ref >60)
GFR, EST AFRICAN AMERICAN: 17 mL/min — AB (ref >60)
Glucose, Bld: 97 mg/dL (ref 65–99)
POTASSIUM: 3.8 mmol/L (ref 3.5–5.1)
SODIUM: 142 mmol/L (ref 135–145)

## 2015-09-22 LAB — GLUCOSE, CAPILLARY
Glucose-Capillary: 76 mg/dL (ref 65–99)
Glucose-Capillary: 173 mg/dL — ABNORMAL HIGH (ref 65–99)

## 2015-09-22 LAB — T3: T3 TOTAL: 81 ng/dL (ref 71–180)

## 2015-09-22 LAB — BRAIN NATRIURETIC PEPTIDE: B Natriuretic Peptide: 931.9 pg/mL — ABNORMAL HIGH (ref 0.0–100.0)

## 2015-09-22 LAB — PROTIME-INR
INR: 2.26 — AB (ref 0.00–1.49)
Prothrombin Time: 24.7 seconds — ABNORMAL HIGH (ref 11.6–15.2)

## 2015-09-22 SURGERY — CARDIOVERSION
Anesthesia: Monitor Anesthesia Care

## 2015-09-22 MED ORDER — POLYSACCHARIDE IRON COMPLEX 150 MG PO CAPS: 150.0000 mg | ORAL_CAPSULE | Freq: Every day | ORAL | Status: DC

## 2015-09-22 MED ORDER — METOPROLOL TARTRATE 25 MG PO TABS: 12.5000 mg | ORAL_TABLET | Freq: Two times a day (BID) | ORAL | Status: DC

## 2015-09-22 MED ORDER — PROPOFOL 10 MG/ML IV BOLUS
INTRAVENOUS | Status: DC | PRN
  Administered 2015-09-22: 30 mg via INTRAVENOUS

## 2015-09-22 MED ORDER — SODIUM CHLORIDE 0.9 % IV SOLN
INTRAVENOUS | Status: DC
  Administered 2015-09-22: 08:00:00 via INTRAVENOUS

## 2015-09-22 MED ORDER — WARFARIN SODIUM 2.5 MG PO TABS: 2.5000 mg | ORAL_TABLET | Freq: Once | ORAL | Status: DC

## 2015-09-22 MED ORDER — TORSEMIDE 20 MG PO TABS: ORAL_TABLET | ORAL | Status: DC

## 2015-09-22 MED ORDER — METHIMAZOLE 5 MG PO TABS: 5.0000 mg | ORAL_TABLET | Freq: Every day | ORAL | Status: DC

## 2015-09-22 MED ORDER — METOPROLOL TARTRATE 12.5 MG HALF TABLET: 12.5000 mg | ORAL_TABLET | Freq: Two times a day (BID) | ORAL | Status: DC

## 2015-09-22 MED ORDER — HYDRALAZINE HCL 25 MG PO TABS: 25.0000 mg | ORAL_TABLET | Freq: Two times a day (BID) | ORAL | Status: DC

## 2015-09-22 NOTE — Discharge Summary (Signed)
Physician Discharge Summary   Patient ID: GENET FISCHL MRN: [id number] DOB/AGE: [birth year] 80 y.o.  Admit date: 09/18/2015 Discharge date: 09/22/2015  Primary Care Physician:  [name], PA-C  Discharge Diagnoses:   Acute hypoxic respiratory failure    Recurrent paroxysmal atrial fibrillation with RVR with palpitations  . Shortness of breath . acute on chronic diastolic heart failure, NYHA class 1 (HCC) . Normocytic anemia . Pressure ulcer . Hypertensive heart disease  Consults: Cardiology  Recommendations for Outpatient Follow-up:  1. Please repeat CBC/BMET, PT/INR at next visit   DIET heart healthy diet    Allergies:   Allergies  Allergen Reactions  . Yellow Dyes (Non-Tartrazine) Nausea And Vomiting    "deathly allergic" No other information given to explain reaction. Per Daughter- this was an IV dye. Ok with yellow coloring on Coumadin tablets.   . Codeine Hives  . Other Other (See Comments)    novocaine broke mouth out  . Penicillins Hives  . Procaine Hives     DISCHARGE MEDICATIONS: Current Discharge Medication List    START taking these medications   Details  iron polysaccharides (NIFEREX) 150 MG capsule Take 1 capsule (150 mg total) by mouth daily. Qty: 30 capsule, Refills: 3      CONTINUE these medications which have CHANGED   Details  hydrALAZINE (APRESOLINE) 25 MG tablet Take 1 tablet (25 mg total) by mouth every 12 (twelve) hours. Qty: 60 tablet, Refills: 3    methimazole (TAPAZOLE) 5 MG tablet Take 1 tablet (5 mg total) by mouth daily. Qty: 30 tablet, Refills: 3    metoprolol tartrate (LOPRESSOR) 25 MG tablet Take 0.5 tablets (12.5 mg total) by mouth 2 (two) times daily. Qty: 60 tablet, Refills: 3    torsemide (DEMADEX) 20 MG tablet TAKE 2 TABLETS EVERY MORNING AND 1 TABLET EVERY EVENING Qty: 90 tablet, Refills: 1      CONTINUE these medications which have NOT CHANGED   Details  atorvastatin (LIPITOR) 20 MG tablet Take 20 mg by  mouth at bedtime.    cholecalciferol (VITAMIN D) 1000 UNITS tablet Take 1,000 Units by mouth every morning.     escitalopram (LEXAPRO) 10 MG tablet Take 10 mg by mouth daily.    fenofibrate 160 MG tablet Take 160 mg by mouth daily.    gabapentin (NEURONTIN) 100 MG capsule Take 1 capsule (100 mg total) by mouth at bedtime. Qty: 30 capsule, Refills: 0    glipiZIDE (GLUCOTROL) 5 MG tablet Take 5 mg by mouth daily before breakfast.    isosorbide mononitrate (IMDUR) 30 MG 24 hr tablet TAKE 1 TABLET BY MOUTH EVERY DAY Qty: 30 tablet, Refills: 6    Multiple Vitamin (MULTIVITAMIN WITH MINERALS) TABS tablet Take 1 tablet by mouth daily.    OXYGEN Inhale 3 L into the lungs at bedtime.     potassium chloride SA (K-DUR,KLOR-CON) 20 MEQ tablet Take 1 tablet (20 mEq total) by mouth daily. Qty: 60 tablet, Refills: 2    warfarin (COUMADIN) 2.5 MG tablet Take 1 tablet (2.5 mg total) by mouth daily at 6 PM. Qty: 30 tablet, Refills: 2    albuterol (PROVENTIL HFA;VENTOLIN HFA) 108 (90 BASE) MCG/ACT inhaler Inhale 2 puffs into the lungs every 6 (six) hours as needed for wheezing or shortness of breath. Qty: 1 Inhaler, Refills: 3      STOP taking these medications     aspirin 81 MG tablet          Brief H and P: For complete details  please refer to admission H and P, but in briefPatient is a 82 year old female with COPD, diabetes, atrial fibrillation, hypertension, CHF presented with shortness of breath for 3 days prior to admission, no fevers or chills or any productive cough, or chest pain. In ED, hemoglobin 7.5, Hemoccult negative, received a nebulizer treatment withan improvement. Glucose 355, creatinine 2.63. Chest x-ray showed small pleural effusions bilaterally with pulmonary vascular congestion. EKG showed atrial fibrillation initially with rapid ventricular response. Patient was admitted for further workup.   Hospital Course:   Acute hypoxic resp failure due to acute on chronic  diastolic CHF.  - 2-D echo 1/17 showed EF of 55-60%  - Patient presented with pulmonary edema, she was placed on IV Lasix, subsequently transitioned to oral Lasix. Per cardiology patient can continue oral torsemide. She has a follow-up appointment scheduled. - Negative balance of 3 L   Underlying COPD. No wheezing. Supportive Care.  Essential hypertension. On hydralazine and beta blocker continue.  Dyslipidemia. Continue statin.  Recurrent paroxysmal atrial fibrillation/tachycardia palpitations.  - CHADS VASC score 6 - Cardiology and EP cardiology was consulted, patient is not a good candidate for amiodarone due to pulmonary issues. She underwent cardioversion on 4/28. Post cardioversion, patient was in normal sinus rhythm and was cleared to be discharged home by Dr. [name].   CKD stage V. Baseline creatinine of 2.5.  - Creatinine stable  Anemia of chronic disease: Anemia panel inconclusive. - Stool test was negative on 4/24, patient received packed RBC transfusion on 4/27. She was also started on Hytrin - Anemia panel also shows iron deficiency along with anemia of chronic disease. CT abdomen was negative for any retroperitoneal hemorrhage.   Hyperthyroidism.  - TSH 1.38 TSH 1.3, patient was placed on daily Tapazole by cardiology.   DM2 - on ISS , Resume home dose Glucotrol - Hemoglobin A1c 8.5, continue glipizide, follow with  PCP for further adjustment.  Stage 2 Pressure Injury left inner gluteal cleft - Per wound care recommendations  Day of Discharge BP 104/35 mmHg  Pulse 56  Temp(Src) 98.5 F (36.9 C) (Oral)  Resp 18  Ht 5\' 2"  (1.575 m)  Wt 59.467 kg (131 lb 1.6 oz)  BMI 23.97 kg/m2  SpO2 99%  Physical Exam: General: Alert and awake oriented x3 not in any acute distress. HEENT: anicteric sclera, pupils reactive to light and accommodation CVS: S1-S2 clear no murmur rubs or gallops Chest: clear to auscultation bilaterally, no wheezing rales or  rhonchi Abdomen: soft nontender, nondistended, normal bowel sounds Extremities: no cyanosis, clubbing or edema noted bilaterally Neuro: Cranial nerves II-XII intact, no focal neurological deficits   The results of significant diagnostics from this hospitalization (including imaging, microbiology, ancillary and laboratory) are listed below for reference.    LAB RESULTS: Basic Metabolic Panel:  Recent Labs Lab 09/21/15 0402 09/22/15 0237  NA 141 142  K 4.2 3.8  CL 99* 99*  CO2 34* 32  GLUCOSE 75 97  BUN 59* 59*  CREATININE 2.77* 2.87*  CALCIUM 8.8* 8.6*   Liver Function Tests: No results for input(s): AST, ALT, ALKPHOS, BILITOT, PROT, ALBUMIN in the last 168 hours. No results for input(s): LIPASE, AMYLASE in the last 168 hours. No results for input(s): AMMONIA in the last 168 hours. CBC:  Recent Labs Lab 09/18/15 1220  09/21/15 1647 09/22/15 0237  WBC 9.3  < > 5.1 4.9  NEUTROABS 8.0*  --   --   --   HGB 7.5*  < > 7.8* 7.6*  HCT  25.1*  < > 25.4* 25.1*  MCV 77.5*  < > 80.9 78.9  PLT 473*  < > 376 358  < > = values in this interval not displayed. Cardiac Enzymes: No results for input(s): CKTOTAL, CKMB, CKMBINDEX, TROPONINI in the last 168 hours. BNP: Invalid input(s): POCBNP CBG:  Recent Labs Lab 09/22/15 0555 09/22/15 1138  GLUCAP 76 173*    Significant Diagnostic Studies:  Dg Chest 2 View  09/19/2015  CLINICAL DATA:  Short of breath EXAM: CHEST  2 VIEW COMPARISON:  Yesterday FINDINGS: Moderate cardiomegaly. Diffuse airspace disease bilaterally has improved. Bilateral small pleural effusions are not significantly changed with associated patchy atelectasis. No pneumothorax. Stable left rib deformities. IMPRESSION: No active cardiopulmonary disease. Electronically Signed   By: Jolaine Click M.D.   On: 09/19/2015 07:44   Dg Chest 2 View  09/18/2015  CLINICAL DATA:  Worsening shortness of breath with cough. Green sputum for 3 days. EXAM: CHEST  2 VIEW COMPARISON:   08/02/2015 FINDINGS: Cardiomegaly with vascular congestion and diffuse interstitial and alveolar opacities most compatible with edema/CHF. Small bilateral effusions. Slight improvement in the edema since prior study. No acute bony abnormality. IMPRESSION: Continued CHF pattern with small bilateral pleural effusions. Slight improvement since prior study. Electronically Signed   By: Charlett Nose M.D.   On: 09/18/2015 13:41    2D ECHO:   Disposition and Follow-up: Discharge Instructions    (HEART FAILURE PATIENTS) Call MD:  Anytime you have any of the following symptoms: 1) 3 pound weight gain in 24 hours or 5 pounds in 1 week 2) shortness of breath, with or without a dry hacking cough 3) swelling in the hands, feet or stomach 4) if you have to sleep on extra pillows at night in order to breathe.    Complete by:  As directed      Diet - low sodium heart healthy    Complete by:  As directed      Increase activity slowly    Complete by:  As directed             DISPOSITION: home   DISCHARGE FOLLOW-UP Follow-up Information    Follow up with Theodore Demark, PA-C On 09/29/2015.   Specialties:  Cardiology, Radiology   Why:  at 9:30 AM  PA for [facility]  NOTE DIFFERENT ADDRESS   Contact information:   [address] [place] [address] [phone]       Follow up with Chrystie Nose, MD On 09/13/2015.   Specialty:  Cardiology   Why:  at 11:30 AM   Contact information:   [address] [place] [place] [address] [phone]        Time spent on Discharge:   Signed:   RAI,RIPUDEEP M.D. [facility] 09/22/2015, 1:38 PM Pager: [phone]

## 2015-09-22 NOTE — H&P (Signed)
     INTERVAL PROCEDURE H&P  History and Physical Interval Note:  09/22/2015 7:52 AM  Alexis [name] has presented today for their planned procedure. The various methods of treatment have been discussed with the patient and family. After consideration of risks, benefits and other options for treatment, the patient has consented to the procedure.  The patients' outpatient history has been reviewed, patient examined, and no change in status from most recent office note within the past 30 days. I have reviewed the patients' chart and labs and will proceed as planned. Questions were answered to the patient's satisfaction.   Chrystie Nose, MD, [facility] Attending Cardiologist [facility]  Chrystie Nose 09/22/2015, 7:52 AM

## 2015-09-22 NOTE — Anesthesia Postprocedure Evaluation (Signed)
Anesthesia Post Note  Patient: KESSIAH MONTELEONE  Procedure(s) Performed: Procedure(s) (LRB): CARDIOVERSION (N/A)  Patient location during evaluation: PACU Anesthesia Type: MAC Level of consciousness: awake and alert Pain management: pain level controlled Vital Signs Assessment: post-procedure vital signs reviewed and stable Respiratory status: spontaneous breathing, nonlabored ventilation, respiratory function stable and patient connected to nasal cannula oxygen Cardiovascular status: stable and blood pressure returned to baseline Anesthetic complications: no    Last Vitals:  Filed Vitals:   09/22/15 0821 09/22/15 0822  BP:  96/27  Pulse: 46 41  Temp:    Resp: 17 17    Last Pain:  Filed Vitals:   09/22/15 0827  PainSc: Asleep                 Reino Kent

## 2015-09-22 NOTE — CV Procedure (Signed)
    CARDIOVERSION NOTE  Procedure: Electrical Cardioversion Indications:  Atrial Fibrillation  Procedure Details:  Consent: Risks of procedure as well as the alternatives and risks of each were explained to the (patient/caregiver).  Consent for procedure obtained.  Time Out: Verified patient identification, verified procedure, site/side was marked, verified correct patient position, special equipment/implants available, medications/allergies/relevent history reviewed, required imaging and test results available.  Performed  Patient placed on cardiac monitor, pulse oximetry, supplemental oxygen as necessary.  Sedation given: Propofol per anesthesia Pacer pads placed anterior and posterior chest.  Cardioverted 1 time(s).  Cardioverted at 120J biphasic.  Impression: Findings: Post procedure EKG shows: Sinus bradycardia Complications: None Patient did tolerate procedure well.  Plan: 1. Successful DCCV to sinus bradycardia with a single 120J biphasic shock. 2. Decrease metroprolol to 12.5 mg BID - hold dose this morning due to bradycardia. 3. Continue warfarin. 4. Ok for d/c home today from a cardiac standpoint.  Time Spent Directly with the Patient:  30 minutes   Chrystie Nose, MD, [facility] Attending Cardiologist [facility]  Chrystie Nose 09/22/2015, 8:20 AM

## 2015-09-22 NOTE — Progress Notes (Signed)
ANTICOAGULATION CONSULT NOTE - Follow Up Consult  Pharmacy Consult:  Coumadin Indication: atrial fibrillation  Allergies  Allergen Reactions  . Yellow Dyes (Non-Tartrazine) Nausea And Vomiting    "deathly allergic" No other information given to explain reaction. Per Daughter- this was an IV dye. Ok with yellow coloring on Coumadin tablets.   . Codeine Hives  . Other Other (See Comments)    novocaine broke mouth out  . Penicillins Hives  . Procaine Hives    Patient Measurements: Height: 5\' 2"  (157.5 cm) Weight: 131 lb 1.6 oz (59.467 kg) (scale b) IBW/kg (Calculated) : 50.1  Vital Signs: Temp: 98.2 F (36.8 C) (04/28 0743) Temp Source: Oral (04/28 0743) BP: 111/29 mmHg (04/28 0835) Pulse Rate: 45 (04/28 0835)  Labs:  Recent Labs  09/20/15 0436  09/21/15 0402 09/21/15 4628 09/21/15 1647 09/22/15 0237  HGB  --   < > 6.8* 7.1* 7.8* 7.6*  HCT  --   < > 22.2* 23.3* 25.4* 25.1*  PLT  --   < > 414* 379 376 358  LABPROT 34.3*  --  29.2*  --   --  24.7*  INR 3.49*  --  2.81*  --   --  2.26*  CREATININE 2.79*  --  2.77*  --   --  2.87*  < > = values in this interval not displayed.  Estimated Creatinine Clearance: 12.2 mL/min (by C-G formula based on Cr of 2.87).     Assessment: 89 YOF continues on Coumadin from PTA for history of AFib.  INR therapeutic and has reduced significantly.  No bleeding reported.    Goal of Therapy:  INR 2-3    Plan:  - Coumadin 2.5mg  PO today - Daily PT / INR   Clarrissa Shimkus D. Laney Potash, PharmD, BCPS Pager:  [phone] 09/22/2015, 9:51 AM

## 2015-09-22 NOTE — Transfer of Care (Signed)
Immediate Anesthesia Transfer of Care Note  Patient: Alexis [name]  Procedure(s) Performed: Procedure(s): CARDIOVERSION (N/A)  Patient Location: Endoscopy Unit  Anesthesia Type:General  Level of Consciousness: awake and alert   Airway & Oxygen Therapy: Patient Spontanous Breathing and Patient connected to nasal cannula oxygen  Post-op Assessment: Report given to RN and Post -op Vital signs reviewed and stable  Post vital signs: Reviewed and stable  Last Vitals:  Filed Vitals:   09/22/15 0542 09/22/15 0743  BP: 120/50 140/44  Pulse: 69 63  Temp: 36.8 C 36.8 C  Resp: 18 10    Last Pain:  Filed Vitals:   09/22/15 0744  PainSc: Asleep         Complications: No apparent anesthesia complications

## 2015-09-22 NOTE — Anesthesia Preprocedure Evaluation (Addendum)
Anesthesia Evaluation  Patient identified by MRN, date of birth, ID band Patient awake    Reviewed: Allergy & Precautions, NPO status , Patient's Chart, lab work & pertinent test results  History of Anesthesia Complications (+) PONV, Family history of anesthesia reaction and history of anesthetic complications  Airway Mallampati: II  TM Distance: >3 FB Neck ROM: Full    Dental no notable dental hx. (+) Edentulous Upper, Edentulous Lower   Pulmonary COPD,    Pulmonary exam normal breath sounds clear to auscultation       Cardiovascular hypertension, Pt. on medications +CHF  Normal cardiovascular exam Rhythm:Regular Rate:Normal     Neuro/Psych PSYCHIATRIC DISORDERS Anxiety negative neurological ROS     GI/Hepatic negative GI ROS, Neg liver ROS,   Endo/Other  diabetes, Type 2, Oral Hypoglycemic Agents  Renal/GU ARFRenal disease     Musculoskeletal negative musculoskeletal ROS (+)   Abdominal   Peds  Hematology negative hematology ROS (+) anemia ,   Anesthesia Other Findings ** Need to clarify DNR status prior to delivering cardioversion which could possibly lead to temporary chest compressions, code medications, temporary pacing  Reproductive/Obstetrics negative OB ROS                           Anesthesia Physical  Anesthesia Plan  ASA: IV  Anesthesia Plan: MAC   Post-op Pain Management:    Induction: Intravenous  Airway Management Planned: Natural Airway  Additional Equipment:   Intra-op Plan:   Post-operative Plan:   Informed Consent: I have reviewed the patients History and Physical, chart, labs and discussed the procedure including the risks, benefits and alternatives for the proposed anesthesia with the patient or authorized representative who has indicated his/her understanding and acceptance.   Dental advisory given  Plan Discussed with: CRNA  Anesthesia Plan  Comments:         Anesthesia Quick Evaluation

## 2015-09-22 NOTE — Telephone Encounter (Signed)
Patient still in hospital on 09/22/15.  First TOC call should be made on 09/25/15

## 2015-09-22 NOTE — Telephone Encounter (Signed)
New message       TCM appt on 09-29-15 with Rhonda Barrett---per Nada Boozer.

## 2015-09-22 NOTE — Progress Notes (Signed)
Orders received for pt discharge.  Discharge summary printed and reviewed with pt.  Explained medication regimen, and pt had no further questions at this time.  IV removed and site remains clean, dry, intact.  Telemetry removed.  Pt in stable condition and awaiting transport. 

## 2015-09-22 NOTE — Care Management Note (Addendum)
Case Management Note  Patient Details  Name: Alexis [name] MRN: [id number] Date of Birth: 1931-10-01  Subjective/Objective:     Pt admitted with COPD, SOB               Action/Plan:  Pt is from home with adult son - uses walker for ambulation in the home.  CM will continue to monitor for disposition needs.    Expected Discharge Date:                  Expected Discharge Plan:  Home/Self Care  In-House Referral:     Discharge planning Services  CM Consult  Post Acute Care Choice:    Choice offered to:     DME Arranged:    DME Agency:     HH Arranged:    HH Agency:     Status of Service:  Completed, signed off  Medicare Important Message Given:    Date Medicare IM Given:    Medicare IM give by:    Date Additional Medicare IM Given:    Additional Medicare Important Message give by:     If discussed at Long Length of Stay Meetings, dates discussed:    Additional Comments: 09/22/2015 Daughter at bedside, pt has home oxygen through [facility], CM contacted agency for portable tank for transport home Cherylann Parr, [place] 09/22/2015, 1:38 PM

## 2015-09-23 ENCOUNTER — Encounter (HOSPITAL_COMMUNITY): Payer: Self-pay | Admitting: Internal Medicine

## 2015-09-25 NOTE — Telephone Encounter (Signed)
Left message to return a call. We are following up on her hospitalization.

## 2015-09-26 ENCOUNTER — Emergency Department (HOSPITAL_COMMUNITY): Payer: Medicare Other

## 2015-09-26 ENCOUNTER — Emergency Department (HOSPITAL_COMMUNITY)
Admission: EM | Admit: 2015-09-26 | Discharge: 2015-09-26 | Disposition: A | Discharge status: Home / Self Care | Payer: Medicare Other | Attending: Emergency Medicine | Admitting: Emergency Medicine

## 2015-09-26 ENCOUNTER — Encounter (HOSPITAL_COMMUNITY): Payer: Self-pay | Admitting: Adult Health

## 2015-09-26 DIAGNOSIS — Z7984 Long term (current) use of oral hypoglycemic drugs: Secondary | ICD-10-CM | POA: Insufficient documentation

## 2015-09-26 DIAGNOSIS — Y9389 Activity, other specified: Secondary | ICD-10-CM | POA: Diagnosis not present

## 2015-09-26 DIAGNOSIS — W01198A Fall on same level from slipping, tripping and stumbling with subsequent striking against other object, initial encounter: Secondary | ICD-10-CM | POA: Insufficient documentation

## 2015-09-26 DIAGNOSIS — F419 Anxiety disorder, unspecified: Secondary | ICD-10-CM | POA: Insufficient documentation

## 2015-09-26 DIAGNOSIS — S0003XA Contusion of scalp, initial encounter: Secondary | ICD-10-CM | POA: Diagnosis not present

## 2015-09-26 DIAGNOSIS — Z23 Encounter for immunization: Secondary | ICD-10-CM | POA: Insufficient documentation

## 2015-09-26 DIAGNOSIS — I509 Heart failure, unspecified: Secondary | ICD-10-CM | POA: Insufficient documentation

## 2015-09-26 DIAGNOSIS — S81812A Laceration without foreign body, left lower leg, initial encounter: Secondary | ICD-10-CM | POA: Insufficient documentation

## 2015-09-26 DIAGNOSIS — Z8719 Personal history of other diseases of the digestive system: Secondary | ICD-10-CM | POA: Diagnosis not present

## 2015-09-26 DIAGNOSIS — S29001A Unspecified injury of muscle and tendon of front wall of thorax, initial encounter: Secondary | ICD-10-CM | POA: Diagnosis not present

## 2015-09-26 DIAGNOSIS — Y998 Other external cause status: Secondary | ICD-10-CM | POA: Diagnosis not present

## 2015-09-26 DIAGNOSIS — J449 Chronic obstructive pulmonary disease, unspecified: Secondary | ICD-10-CM | POA: Diagnosis not present

## 2015-09-26 DIAGNOSIS — Z79899 Other long term (current) drug therapy: Secondary | ICD-10-CM | POA: Insufficient documentation

## 2015-09-26 DIAGNOSIS — Y92009 Unspecified place in unspecified non-institutional (private) residence as the place of occurrence of the external cause: Secondary | ICD-10-CM | POA: Insufficient documentation

## 2015-09-26 DIAGNOSIS — Z7901 Long term (current) use of anticoagulants: Secondary | ICD-10-CM | POA: Diagnosis not present

## 2015-09-26 DIAGNOSIS — G629 Polyneuropathy, unspecified: Secondary | ICD-10-CM | POA: Insufficient documentation

## 2015-09-26 DIAGNOSIS — E119 Type 2 diabetes mellitus without complications: Secondary | ICD-10-CM | POA: Insufficient documentation

## 2015-09-26 DIAGNOSIS — Z880 Allergy status to penicillin: Secondary | ICD-10-CM | POA: Diagnosis not present

## 2015-09-26 DIAGNOSIS — I130 Hypertensive heart and chronic kidney disease with heart failure and stage 1 through stage 4 chronic kidney disease, or unspecified chronic kidney disease: Secondary | ICD-10-CM | POA: Diagnosis not present

## 2015-09-26 DIAGNOSIS — S81819A Laceration without foreign body, unspecified lower leg, initial encounter: Secondary | ICD-10-CM

## 2015-09-26 DIAGNOSIS — S0990XA Unspecified injury of head, initial encounter: Secondary | ICD-10-CM | POA: Diagnosis present

## 2015-09-26 DIAGNOSIS — N184 Chronic kidney disease, stage 4 (severe): Secondary | ICD-10-CM | POA: Insufficient documentation

## 2015-09-26 LAB — CBC WITH DIFFERENTIAL/PLATELET
BASOS ABS: 0 10*3/uL (ref 0.0–0.1)
BASOS PCT: 0 %
EOS ABS: 0.2 10*3/uL (ref 0.0–0.7)
EOS PCT: 4 %
HCT: 26.7 % — ABNORMAL LOW (ref 36.0–46.0)
Hemoglobin: 8.1 g/dL — ABNORMAL LOW (ref 12.0–15.0)
Lymphocytes Relative: 15 %
Lymphs Abs: 0.8 10*3/uL (ref 0.7–4.0)
MCH: 24.1 pg — ABNORMAL LOW (ref 26.0–34.0)
MCHC: 30.3 g/dL (ref 30.0–36.0)
MCV: 79.5 fL (ref 78.0–100.0)
MONO ABS: 0.5 10*3/uL (ref 0.1–1.0)
Monocytes Relative: 10 %
Neutro Abs: 3.6 10*3/uL (ref 1.7–7.7)
Neutrophils Relative %: 71 %
PLATELETS: 291 10*3/uL (ref 150–400)
RBC: 3.36 MIL/uL — AB (ref 3.87–5.11)
RDW: 19.3 % — AB (ref 11.5–15.5)
WBC: 5.1 10*3/uL (ref 4.0–10.5)

## 2015-09-26 LAB — BASIC METABOLIC PANEL
Anion gap: 12 (ref 5–15)
BUN: 50 mg/dL — AB (ref 6–20)
CALCIUM: 8.8 mg/dL — AB (ref 8.9–10.3)
CO2: 29 mmol/L (ref 22–32)
CREATININE: 2.54 mg/dL — AB (ref 0.44–1.00)
Chloride: 100 mmol/L — ABNORMAL LOW (ref 101–111)
GFR calc Af Amer: 19 mL/min — ABNORMAL LOW (ref >60)
GFR, EST NON AFRICAN AMERICAN: 17 mL/min — AB (ref >60)
GLUCOSE: 133 mg/dL — AB (ref 65–99)
POTASSIUM: 3.5 mmol/L (ref 3.5–5.1)
SODIUM: 141 mmol/L (ref 135–145)

## 2015-09-26 LAB — PROTIME-INR
INR: 1.86 — ABNORMAL HIGH (ref 0.00–1.49)
PROTHROMBIN TIME: 21.4 s — AB (ref 11.6–15.2)

## 2015-09-26 MED ORDER — TETANUS-DIPHTH-ACELL PERTUSSIS 5-2.5-18.5 LF-MCG/0.5 IM SUSP
0.5000 mL | Freq: Once | INTRAMUSCULAR | Status: AC
  Administered 2015-09-26: 0.5 mL via INTRAMUSCULAR
  Filled 2015-09-26: qty 0.5

## 2015-09-26 NOTE — ED Provider Notes (Signed)
CSN: [id number]     Arrival date & time 09/26/15  2440 History  By signing my name below, I, Doreatha Martin, attest that this documentation has been prepared under the direction and in the presence of Laurence Spates, MD. Electronically Signed: Doreatha Martin, ED Scribe. 09/26/2015. 3:42 AM.    Chief Complaint  Patient presents with  . Fall   The history is provided by the patient. No language interpreter was used.   HPI Comments: Alexis [name] is a 82 y.o. female with h/o COPD on 3L home O2, DMII, Afib, HTN, CHF who presents to the Emergency Department complaining of moderate left occipital HA and left knee pain s/p mechanical fall that occurred just PTA. Pt states she lost her balance as she was standing after using the restroom, fell backward and struck her head on a closet door before landing on her left knee. Denies LOC. Pt lives with her son, who assisted her after her fall. She is able to flex and extend the knee without difficulty. Pt states that her knee pain is worsened with movement and weight-bearing. Tdap unknown. Pt denies neck pain, back pain, CP, SOB, dizziness, lightheadedness, additional injuries.   Past Medical History  Diagnosis Date  . Hypertensive heart disease   . Neuropathy (HCC)   . Anxiety   . PONV (postoperative nausea and vomiting)   . Chronic diastolic CHF (congestive heart failure) (HCC)     a. 05/2015 Echo: EF 55-60%, mild LVH, no rwma, mild MR, mildly dil RA, mod TR, PASP .  Marland Kitchen COPD (chronic obstructive pulmonary disease) (HCC)     a. on home O2 @ 3lpm.  . Family history of adverse reaction to anesthesia   . Type II diabetes mellitus (HCC)   . Macular degeneration, bilateral   . Varicose veins   . GIB (gastrointestinal bleeding)     a. History of GIB.  Marland Kitchen Paroxysmal atrial fibrillation (HCC)     a. CHA2DS2VASc = 6-->coumadin.  . Confusion state   . CKD (chronic kidney disease), stage IV (HCC)   . Elevated troponin     a. H/o elevated trop in  setting of CHF; b. 06/2013 Lexiscan MV: nl EF, mild anterior breast attenuation, no ischemia.    Past Surgical History  Procedure Laterality Date  . Hip fracture surgery  2004  . Abdominal hysterectomy    . Vein surgery    . Esophagogastroduodenoscopy N/A 12/01/2014    Procedure: ESOPHAGOGASTRODUODENOSCOPY (EGD);  Surgeon: Hilarie Fredrickson, MD;  Location[facility] ENDOSCOPY;  Service: Endoscopy;  Laterality: N/A;  . Cardioversion N/A 09/22/2015    Procedure: CARDIOVERSION;  Surgeon: Chrystie Nose, MD;  Location: [facility] ENDOSCOPY;  Service: Cardiovascular;  Laterality: N/A;   Family History  Problem Relation Age of Onset  . Colon cancer Father 62    advanced when discovered, no surgery or therapy.  this led to his death  . Breast cancer Sister   . Cancer Brother   . Breast cancer Mother   . Lung cancer Brother   . Thyroid disease Neg Hx    Social History  Substance Use Topics  . Smoking status: Passive Smoke Exposure - Never Smoker  . Smokeless tobacco: Never Used  . Alcohol Use: No   OB History    No data available     Review of Systems A complete 10 system review of systems was obtained and all systems are negative except as noted in the HPI and PMH.    Allergies  Yellow dyes (non-tartrazine); Codeine; Other; Penicillins; and Procaine  Home Medications   Prior to Admission medications   Medication Sig Start Date End Date Taking? Authorizing Provider  albuterol (PROVENTIL HFA;VENTOLIN HFA) 108 (90 BASE) MCG/ACT inhaler Inhale 2 puffs into the lungs every 6 (six) hours as needed for wheezing or shortness of breath. 06/11/13   Nishant Dhungel, MD  atorvastatin (LIPITOR) 20 MG tablet Take 20 mg by mouth at bedtime. 10/12/14 10/12/15  Historical Provider, MD  cholecalciferol (VITAMIN D) 1000 UNITS tablet Take 1,000 Units by mouth every morning.     Historical Provider, MD  escitalopram (LEXAPRO) 10 MG tablet Take 10 mg by mouth daily.    Historical Provider, MD  fenofibrate 160 MG tablet Take  160 mg by mouth daily.    Historical Provider, MD  gabapentin (NEURONTIN) 100 MG capsule Take 1 capsule (100 mg total) by mouth at bedtime. 08/05/15   Rodolph Bong, MD  glipiZIDE (GLUCOTROL) 5 MG tablet Take 5 mg by mouth daily before breakfast.    Historical Provider, MD  hydrALAZINE (APRESOLINE) 25 MG tablet Take 1 tablet (25 mg total) by mouth every 12 (twelve) hours. 09/22/15   Ripudeep Jenna Luo, MD  iron polysaccharides (NIFEREX) 150 MG capsule Take 1 capsule (150 mg total) by mouth daily. 09/22/15   Ripudeep Jenna Luo, MD  isosorbide mononitrate (IMDUR) 30 MG 24 hr tablet TAKE 1 TABLET BY MOUTH EVERY DAY 01/31/15   Chrystie Nose, MD  methimazole (TAPAZOLE) 5 MG tablet Take 1 tablet (5 mg total) by mouth daily. 09/22/15   Ripudeep Jenna Luo, MD  metoprolol tartrate (LOPRESSOR) 25 MG tablet Take 0.5 tablets (12.5 mg total) by mouth 2 (two) times daily. 09/22/15   Ripudeep Jenna Luo, MD  Multiple Vitamin (MULTIVITAMIN WITH MINERALS) TABS tablet Take 1 tablet by mouth daily.    Historical Provider, MD  OXYGEN Inhale 3 L into the lungs at bedtime.     Historical Provider, MD  potassium chloride SA (K-DUR,KLOR-CON) 20 MEQ tablet Take 1 tablet (20 mEq total) by mouth daily. 12/25/14   Lora Paula, MD  torsemide (DEMADEX) 20 MG tablet TAKE 2 TABLETS EVERY MORNING AND 1 TABLET EVERY EVENING 09/22/15   Ripudeep Jenna Luo, MD  warfarin (COUMADIN) 2.5 MG tablet Take 1 tablet (2.5 mg total) by mouth daily at 6 PM. 06/23/15   Meredeth Ide, MD   BP 145/74 mmHg  Pulse 80  Temp(Src) 98.2 F (36.8 C) (Oral)  Resp 18  SpO2 97% Physical Exam  Constitutional: She is oriented to person, place, and time. She appears well-developed and well-nourished. No distress.  HENT:  Head: Normocephalic and atraumatic.  Moist mucous membranes  Eyes: Conjunctivae are normal. Pupils are equal, round, and reactive to light.  Neck: Neck supple.  Cardiovascular: Normal rate, regular rhythm and normal heart sounds.   No murmur  heard. Pulmonary/Chest: Effort normal and breath sounds normal.  Abdominal: Soft. Bowel sounds are normal. She exhibits no distension. There is no tenderness.  Musculoskeletal: She exhibits no edema.  Neurological: She is alert and oriented to person, place, and time.  Fluent speech  Skin: Skin is warm and dry.  Psychiatric: She has a normal mood and affect. Judgment normal.  Nursing note and vitals reviewed.   ED Course  Procedures (including critical care time) DIAGNOSTIC STUDIES: Oxygen Saturation is 97% on [place] 3L/min, normal by my interpretation.    COORDINATION OF CARE: 3:41 AM Discussed treatment plan with pt at bedside which includes  XR and pt agreed to plan.   Labs Review Labs Reviewed  BASIC METABOLIC PANEL - Abnormal; Notable for the following:    Chloride 100 (*)    Glucose, Bld 133 (*)    BUN 50 (*)    Creatinine, Ser 2.54 (*)    Calcium 8.8 (*)    GFR calc non Af Amer 17 (*)    GFR calc Af Amer 19 (*)    All other components within normal limits  CBC WITH DIFFERENTIAL/PLATELET - Abnormal; Notable for the following:    RBC 3.36 (*)    Hemoglobin 8.1 (*)    HCT 26.7 (*)    MCH 24.1 (*)    RDW 19.3 (*)    All other components within normal limits  PROTIME-INR - Abnormal; Notable for the following:    Prothrombin Time 21.4 (*)    INR 1.86 (*)    All other components within normal limits    Imaging Review Dg Chest 2 View  09/26/2015  CLINICAL DATA:  Larey Seat tonight, striking head and left chest. Patient is on Coumadin. EXAM: CHEST  2 VIEW COMPARISON:  09/19/2015 FINDINGS: Diffuse enlargement of the cardiac silhouette with mild pulmonary vascular congestion and interstitial edema. Small bilateral pleural effusions with basilar atelectasis. No pneumothorax. Old appearing left rib fractures. IMPRESSION: Cardiac enlargement with mild pulmonary vascular congestion interstitial edema. Small bilateral pleural effusions. Electronically Signed   By: Burman Nieves M.D.   On:  09/26/2015 04:54   Ct Head Wo Contrast  09/26/2015  CLINICAL DATA:  Moderate left occipital headache after a fall. Struck head on a closet door. No loss of consciousness. On Coumadin. EXAM: CT HEAD WITHOUT CONTRAST CT CERVICAL SPINE WITHOUT CONTRAST TECHNIQUE: Multidetector CT imaging of the head and cervical spine was performed following the standard protocol without intravenous contrast. Multiplanar CT image reconstructions of the cervical spine were also generated. COMPARISON:  CT head 04/23/2009.  MRI brain 04/12/2009. FINDINGS: CT HEAD FINDINGS Diffuse cerebral atrophy. Mild ventricular dilatation consistent with central atrophy. Low-attenuation changes throughout the deep white matter consistent with small vessel ischemia. No mass effect or midline shift. No abnormal extra-axial fluid collections. Gray-white matter junctions are distinct. Basal cisterns are not effaced. No evidence of acute intracranial hemorrhage. No depressed skull fractures. Visualized paranasal sinuses and mastoid air cells are not opacified. CT CERVICAL SPINE FINDINGS Normal alignment of the cervical spine. Diffuse degenerative change with narrowed cervical interspaces and endplate hypertrophic changes throughout. Mild degenerative changes in the cervical facet joints. No vertebral compression deformities. No prevertebral soft tissue swelling. No focal bone lesion or bone destruction. C1-2 articulation appears intact. Soft tissues are unremarkable. Diffuse nodular enlargement of the thyroid gland. Calcifications in the right thyroid. Right pleural effusion. Interstitial changes in the lung apices may indicate edema. IMPRESSION: No acute intracranial abnormalities. Chronic atrophy and small vessel ischemic changes. Normal alignment of the cervical spine. Diffuse degenerative change. No acute displaced fractures identified. Incidental note of right pleural effusion and possible edema in the lungs. Heterogeneous nodular enlargement of the  thyroid gland with calcification. Consider follow-up with ultrasound in the elective setting. Electronically Signed   By: Burman Nieves M.D.   On: 09/26/2015 04:50   Ct Cervical Spine Wo Contrast  09/26/2015  CLINICAL DATA:  Moderate left occipital headache after a fall. Struck head on a closet door. No loss of consciousness. On Coumadin. EXAM: CT HEAD WITHOUT CONTRAST CT CERVICAL SPINE WITHOUT CONTRAST TECHNIQUE: Multidetector CT imaging of the head and cervical spine  was performed following the standard protocol without intravenous contrast. Multiplanar CT image reconstructions of the cervical spine were also generated. COMPARISON:  CT head 04/23/2009.  MRI brain 04/12/2009. FINDINGS: CT HEAD FINDINGS Diffuse cerebral atrophy. Mild ventricular dilatation consistent with central atrophy. Low-attenuation changes throughout the deep white matter consistent with small vessel ischemia. No mass effect or midline shift. No abnormal extra-axial fluid collections. Gray-white matter junctions are distinct. Basal cisterns are not effaced. No evidence of acute intracranial hemorrhage. No depressed skull fractures. Visualized paranasal sinuses and mastoid air cells are not opacified. CT CERVICAL SPINE FINDINGS Normal alignment of the cervical spine. Diffuse degenerative change with narrowed cervical interspaces and endplate hypertrophic changes throughout. Mild degenerative changes in the cervical facet joints. No vertebral compression deformities. No prevertebral soft tissue swelling. No focal bone lesion or bone destruction. C1-2 articulation appears intact. Soft tissues are unremarkable. Diffuse nodular enlargement of the thyroid gland. Calcifications in the right thyroid. Right pleural effusion. Interstitial changes in the lung apices may indicate edema. IMPRESSION: No acute intracranial abnormalities. Chronic atrophy and small vessel ischemic changes. Normal alignment of the cervical spine. Diffuse degenerative  change. No acute displaced fractures identified. Incidental note of right pleural effusion and possible edema in the lungs. Heterogeneous nodular enlargement of the thyroid gland with calcification. Consider follow-up with ultrasound in the elective setting. Electronically Signed   By: Burman Nieves M.D.   On: 09/26/2015 04:50   I have personally reviewed and evaluated these lab results as part of my medical decision-making.   EKG Interpretation None       Medications  Tdap (BOOSTRIX) injection 0.5 mL (not administered)     MDM   Final diagnoses:  Scalp hematoma, initial encounter  Skin tear of lower leg without complication, unspecified laterality, initial encounter  Congestive heart failure, unspecified congestive heart failure chronicity, unspecified congestive heart failure type (HCC)   Patient on Coumadin presents after a fall from standing during which she struck the left side of her head and her left knee. No loss of consciousness. On arrival, the patient was awake and alert, in no acute distress. Vital signs stable on home oxygen 3L [place]. She had a small hematoma above her left ear and a skin tear on her left knee, normal range of motion of left knee. Mild left lateral chest tenderness without crepitus or bruising. Obtained CT of head and C-spine as well as chest x-ray to evaluate for acute injury. Imaging showed no acute findings from the patient's fall but did note nodular thyroid. I have discussed recommendation of outpatient ultrasound which can be arranged with her PCP. Her lab work shows stable CK D with creatinine 2.5, stable hemoglobin of 8, INR mildly subtherapeutic at 1.86. I have also discussed follow-up with Coumadin clinic for medication readjustment. Chest x-ray did note pulmonary vascular congestion and small bilateral pleural effusions. I discussed findings extensively with the patient and she denies any increased oxygen requirement, change in her chronic shortness of  breath, or worsening lower extremity edema. She has been compliant with torsemide. I instructed her to contact her cardiologist to arrange close follow-up and to monitor for any signs of volume overload including worsening shortness of breath or increased oxygen requirement. The patient and her son voiced understanding. Updated tetanus vaccination and discussed wound care as well as return precautions including any neurologic symptoms as the patient is at risk for delayed head bleed and her Coumadin use. Son and patient voiced understanding of return precautions as well as  f/u plan and patient was discharged in satisfactory condition.   I personally performed the services described in this documentation, which was scribed in my presence. The recorded information has been reviewed and is accurate.   Laurence Spates, MD 09/26/15 [phone]

## 2015-09-26 NOTE — Telephone Encounter (Signed)
LEFT MESSAGE ON  DAUGHTER'S , JEANETTE STANLEY, CELL  PHONE TO CALL BACK- IN REGARDS TCM CALL

## 2015-09-26 NOTE — ED Notes (Signed)
Presents from home with fall-pt fell backwards and hit head on hall door while trying to get up to use potty chair at home. Taking Coumadin. DeniesLOC. Hematoma to left side of head. Uses 3 liters of 02 at all times. Alert, oriented and MAE x4. Denies neck pain.

## 2015-09-26 NOTE — Discharge Instructions (Signed)
Contact your primary care provider to schedule an ultrasound of your thyroid gland.

## 2015-09-27 NOTE — Telephone Encounter (Addendum)
Patient's contacted regarding discharge from [facility] on 09/22/15.  Patient understands to follow up with provider rhonda barrett on 09/29/15 at  at [facility]. Patient understands discharge instructions? yes  Patient understands medications and regiment? yes  Patient understands to bring all medications to this visit? yes

## 2015-09-29 ENCOUNTER — Encounter: Payer: Self-pay | Admitting: Physician Assistant

## 2015-09-29 ENCOUNTER — Ambulatory Visit (INDEPENDENT_AMBULATORY_CARE_PROVIDER_SITE_OTHER): Payer: Medicare Other | Admitting: Physician Assistant

## 2015-09-29 VITALS — BP 140/50 | HR 72 | Ht 62.0 in | Wt 130.2 lb

## 2015-09-29 DIAGNOSIS — I481 Persistent atrial fibrillation: Secondary | ICD-10-CM

## 2015-09-29 DIAGNOSIS — I5031 Acute diastolic (congestive) heart failure: Secondary | ICD-10-CM

## 2015-09-29 DIAGNOSIS — I160 Hypertensive urgency: Secondary | ICD-10-CM | POA: Diagnosis not present

## 2015-09-29 DIAGNOSIS — I10 Essential (primary) hypertension: Secondary | ICD-10-CM

## 2015-09-29 DIAGNOSIS — Z7901 Long term (current) use of anticoagulants: Secondary | ICD-10-CM

## 2015-09-29 DIAGNOSIS — I4819 Other persistent atrial fibrillation: Secondary | ICD-10-CM

## 2015-09-29 MED ORDER — TORSEMIDE 20 MG PO TABS: ORAL_TABLET | ORAL | Status: DC

## 2015-09-29 MED ORDER — POTASSIUM CHLORIDE CRYS ER 20 MEQ PO TBCR: EXTENDED_RELEASE_TABLET | ORAL | Status: DC

## 2015-09-29 NOTE — Progress Notes (Signed)
Cardiology Office Note   Date:  09/29/2015   ID:  Alexis [name], DOB Oct 07, 1931, MRN [id number]  PCP:  [name], PA-C  Cardiologist:  Dr Weston Anna, PA-C   No chief complaint on file.   History of Present Illness: Alexis [name] is a 82 y.o. female with a history of HTN, D-CHF, DM, PAF on coumadin, CKD IV, COPD.  D/c 04/28 after admit for resp failure 2nd CHF and rapid afib. S/p DCCV. Wt at d/c 131 lbs.  Alexis [name] presents for post hospital follow-up. She is here today with her oldest daughter  Since discharge from the hospital, she has tried to eat a low-sodium diet. Her son has heart failure as well and is trying to help her. She lives with him. However, she had a [employer] biscuit this morning and was unaware that it had 740 mg of sodium. She has been eating more oatmeal at breakfast and they are cooking more meals in. She is compliant with her medications. Her weight has not changed significantly on her home scales.  She is still having problems with dyspnea on exertion in her activity tolerance is poor. She has some orthopnea, but denies PND. She has not had lower extremity edema.  She has not had palpitations. Her heart rate is in the 50s but she is asymptomatic with this.  She has fallen 3 times since she left the hospital. She feels the falls are related to getting off balance. She denies palpitations or loss of consciousness. The first fall happened at home and she was transported to the emergency room. The second fall was when she was in the doctor's office, and the third one was at home. She is very weak but states she is trying to do some exercises at home to get stronger. She has worked with home physical therapy in the past.   Past Medical History  Diagnosis Date  . Hypertensive heart disease   . Neuropathy (HCC)   . Anxiety   . PONV (postoperative nausea and vomiting)   . Chronic diastolic CHF (congestive heart failure)  (HCC)     a. 05/2015 Echo: EF 55-60%, mild LVH, no rwma, mild MR, mildly dil RA, mod TR, PASP .  Marland Kitchen COPD (chronic obstructive pulmonary disease) (HCC)     a. on home O2 @ 3lpm.  . Family history of adverse reaction to anesthesia   . Type II diabetes mellitus (HCC)   . Macular degeneration, bilateral   . Varicose veins   . GIB (gastrointestinal bleeding)     a. History of GIB.  Marland Kitchen Paroxysmal atrial fibrillation (HCC)     a. CHA2DS2VASc = 6-->coumadin.  . Confusion state   . CKD (chronic kidney disease), stage IV (HCC)   . Elevated troponin     a. H/o elevated trop in setting of CHF; b. 06/2013 Lexiscan MV: nl EF, mild anterior breast attenuation, no ischemia.     Past Surgical History  Procedure Laterality Date  . Hip fracture surgery  2004  . Abdominal hysterectomy    . Vein surgery    . Esophagogastroduodenoscopy N/A 12/01/2014    Procedure: ESOPHAGOGASTRODUODENOSCOPY (EGD);  Surgeon: Hilarie Fredrickson, MD;  Location: [facility] ENDOSCOPY;  Service: Endoscopy;  Laterality: N/A;  . Cardioversion N/A 09/22/2015    Procedure: CARDIOVERSION;  Surgeon: Chrystie Nose, MD;  Location: [facility] ENDOSCOPY;  Service: Cardiovascular;  Laterality: N/A;    Current Outpatient Prescriptions  Medication Sig Dispense Refill  .  albuterol (PROVENTIL HFA;VENTOLIN HFA) 108 (90 BASE) MCG/ACT inhaler Inhale 2 puffs into the lungs every 6 (six) hours as needed for wheezing or shortness of breath. 1 Inhaler 3  . atorvastatin (LIPITOR) 20 MG tablet Take 20 mg by mouth at bedtime.    . cholecalciferol (VITAMIN D) 1000 UNITS tablet Take 1,000 Units by mouth every morning.     . escitalopram (LEXAPRO) 10 MG tablet Take 10 mg by mouth daily.    . fenofibrate 160 MG tablet Take 160 mg by mouth daily.    Marland Kitchen gabapentin (NEURONTIN) 100 MG capsule Take 1 capsule (100 mg total) by mouth at bedtime. 30 capsule 0  . hydrALAZINE (APRESOLINE) 25 MG tablet Take 1 tablet (25 mg total) by mouth every 12 (twelve) hours. 60 tablet 3  . iron  polysaccharides (NIFEREX) 150 MG capsule Take 1 capsule (150 mg total) by mouth daily. 30 capsule 3  . isosorbide mononitrate (IMDUR) 30 MG 24 hr tablet TAKE 1 TABLET BY MOUTH EVERY DAY 30 tablet 6  . methimazole (TAPAZOLE) 5 MG tablet Take 1 tablet (5 mg total) by mouth daily. 30 tablet 3  . metoprolol tartrate (LOPRESSOR) 25 MG tablet Take 0.5 tablets (12.5 mg total) by mouth 2 (two) times daily. 60 tablet 3  . Multiple Vitamin (MULTIVITAMIN WITH MINERALS) TABS tablet Take 1 tablet by mouth daily.    . OXYGEN Inhale 3 L into the lungs at bedtime.     . potassium chloride SA (K-DUR,KLOR-CON) 20 MEQ tablet Take 1 tablet (20 mEq total) by mouth daily. 60 tablet 2  . torsemide (DEMADEX) 20 MG tablet Take 20 mg by mouth daily. TAKE TWO TABS BY MOUTH EVERY MORNING AND ONE TAB EVERY NIGHT    . warfarin (COUMADIN) 2.5 MG tablet Take 1 tablet (2.5 mg total) by mouth daily at 6 PM. 30 tablet 2   No current facility-administered medications for this visit.    Allergies:   Yellow dyes (non-tartrazine); Codeine; Other; Penicillins; and Procaine    Social History:  The patient  reports that she has been passively smoking.  She has never used smokeless tobacco. She reports that she does not drink alcohol or use illicit drugs.   Family History:  The patient's family history includes Breast cancer in her mother and sister; Cancer in her brother; Colon cancer (age of onset: 20) in her father; Hypertension in her mother; Lung cancer in her brother. There is no history of Thyroid disease or Heart attack.    ROS:  Please see the history of present illness. All other systems are reviewed and negative.    PHYSICAL EXAM: VS:  BP 140/50 mmHg  Pulse 72  Ht 5\' 2"  (1.575 m)  Wt 130 lb 3.2 oz (59.058 kg)  BMI 23.81 kg/m2 , BMI Body mass index is 23.81 kg/(m^2). GEN: Well nourished, well developed, female in no acute distress HEENT: normal for age  Neck: JVD 9 cm, positive hepatojugular reflux, no carotid bruit,  no masses Cardiac: RRR; + murmur, no rubs, or gallops Respiratory: Basilar rales bilaterally, normal work of breathing GI: soft, nontender, nondistended, + BS MS: no deformity or atrophy; no edema; distal pulses are 2+ in all 4 extremities  Skin: warm and dry, no rash Neuro:  Strength and sensation are intact Psych: euthymic mood, full affect   EKG:  EKG is ordered today. The ekg ordered today demonstrates sinus bradycardia, heart rate 56 no acute changes   Recent Labs: 06/30/2015: ALT 30 08/04/2015: Magnesium 2.3 09/21/2015:  TSH 1.387 09/22/2015: B Natriuretic Peptide 931.9* 09/26/2015: BUN 50*; Creatinine, Ser 2.54*; Hemoglobin 8.1*; Platelets 291; Potassium 3.5; Sodium 141    Lipid Panel    Component Value Date/Time   CHOL 126 12/14/2014 0325   TRIG 71 12/14/2014 0325   HDL 40* 12/14/2014 0325   CHOLHDL 3.2 12/14/2014 0325   VLDL 14 12/14/2014 0325   LDLCALC 72 12/14/2014 0325     Wt Readings from Last 3 Encounters:  09/29/15 130 lb 3.2 oz (59.058 kg)  09/22/15 131 lb 1.6 oz (59.467 kg)  08/18/15 135 lb 14.4 oz (61.644 kg)     Other studies Reviewed: Additional studies/ records that were reviewed today include: Office notes and hospital records.  ASSESSMENT AND PLAN:  1.  Acute on Chronic diastolic CHF: Her weight has not significantly changed from her discharge weight, but she has volume overload by exam. She still has significant dyspnea on exertion. We will increase her Demadex up to 60 mg a.m. and 40 mg p.m. for 2 days to see if we can pull up more fluid off and improve her respiratory status. We will increase her potassium to 20 mEq twice a day. Keep her follow-up appointment on 05/19 with Dr. [name] and check a BMET at that time.  Her family was encouraged to check out fast food places online and read labels in the grocery store to come up with low-sodium options that she likes.  2. Falls: She is weak but I am also concerned that some of this may be due to dyspnea on  exertion. She is encouraged to continue exercising at home if she is able and follow-up with primary care. She is using a rolling walker consistently now.  3. Atrial fibrillation: She is maintaining sinus rhythm. She is compliant with her metoprolol. Her heart rate is in the mid 50s and she is asymptomatic with this. No dose change.  4. Anticoagulation: Continue scheduled Coumadin checks.  Addendum: After the patient left the office I realize that her INR had been slightly subtherapeutic when he was checked in the ER on 05/02. The patient was contacted and asked to take an extra Coumadin tablet today.   Current medicines are reviewed at length with the patient today.  The patient does not have concerns regarding medicines.  The following changes have been made:  Temporarily increase Demadex and potassium  Labs/ tests ordered today include:   Orders Placed This Encounter  Procedures  . Basic Metabolic Panel (BMET)  . EKG 12-Lead    Disposition:   FU with Dr. [name] as scheduled  Signed, Leanna Battles  09/29/2015 12:41 PM    [facility] Phone: [phone]; Fax: [phone]  This note was written with the assistance of speech recognition software. Please excuse any transcriptional errors.

## 2015-09-29 NOTE — Patient Instructions (Addendum)
Medication Instructions:  Your physician has recommended you make the following change in your medication:  1.  INCREASE the Demadex to 20 mg taking 3 tablets in the morning and 2 tablets at night X's 2 days then go back to 2 tablets in the morning and 1 tablet at night 2.  INCREASE the Potassium 20 meq to 2 tablets daily X's 2 days then go back down to 1 tablet daily 3.  TAKE A EXTRA COUMADIN TODAY ONLY  Labwork: 10/13/15:  BMET  Testing/Procedures: None ordered  Follow-Up: Your physician recommends that you schedule a follow-up appointment in: KEEP YOUR FOLLOW UP APPOINTMENT WITH DR. HILTY AS PLANNED   Any Other Special Instructions Will Be Listed Below (If Applicable).   If you need a refill on your cardiac medications before your next appointment, please call your pharmacy.

## 2015-10-13 ENCOUNTER — Ambulatory Visit (INDEPENDENT_AMBULATORY_CARE_PROVIDER_SITE_OTHER): Payer: Medicare Other | Admitting: Internal Medicine

## 2015-10-13 ENCOUNTER — Encounter: Payer: Self-pay | Admitting: Internal Medicine

## 2015-10-13 ENCOUNTER — Ambulatory Visit (INDEPENDENT_AMBULATORY_CARE_PROVIDER_SITE_OTHER): Payer: Medicare Other | Admitting: Pharmacist

## 2015-10-13 VITALS — BP 160/52 | HR 54 | Ht 62.0 in | Wt 128.0 lb

## 2015-10-13 DIAGNOSIS — I110 Hypertensive heart disease with heart failure: Secondary | ICD-10-CM | POA: Diagnosis not present

## 2015-10-13 DIAGNOSIS — I5030 Unspecified diastolic (congestive) heart failure: Secondary | ICD-10-CM

## 2015-10-13 DIAGNOSIS — Z7901 Long term (current) use of anticoagulants: Secondary | ICD-10-CM

## 2015-10-13 DIAGNOSIS — I481 Persistent atrial fibrillation: Secondary | ICD-10-CM

## 2015-10-13 DIAGNOSIS — I4819 Other persistent atrial fibrillation: Secondary | ICD-10-CM

## 2015-10-13 DIAGNOSIS — I480 Paroxysmal atrial fibrillation: Secondary | ICD-10-CM

## 2015-10-13 LAB — POCT INR: INR: 1.4

## 2015-10-13 NOTE — Patient Instructions (Signed)
Your physician recommends that you schedule a follow-up appointment in: 3 Months  

## 2015-10-13 NOTE — Progress Notes (Signed)
OFFICE NOTE  Chief Complaint:  Hospital follow-up  Primary Care Physician: [name], PA-C  HPI:  Alexis [name] is a pleasant 82 year old female who was recently seen at any and for heart failure exacerbation. She presented at this couple of weeks of increasing shortness of breath, cough, increasing abdominal girth and lower extremity edema. On admission her BNP was elevated and she responded fairly quickly to diuresis. This caused improvement in her breathing. She was noted to be hypertensive and continues to be hypertensive today. She has had some acute on chronic renal insufficiency.  She reports that when she was discharged she was sent home on oxygen, but was never told why she needed it. She followed up with her primary care provider, who is with [facility] and he recommended that she see a cardiologist to help determine whether she needs to continue to take oxygen. She apparently sought out our group and on seeing her today.  Her past medical history is significant for hypertension, diabetes, dyslipidemia and family history of heart disease.  She reports that since discharge her breathing is better, however when she does marked exertion she does feel short of breath. She is not using her oxygen. She denies any chest pain.  An echocardiogram performed at [facility] showed an EF of 65-70%, with moderate concentric LVH, and no wall motion abnormalities.  She's never had a stress test evaluation.  Alexis [name] returns today for followup of her stress test. This is a nuclear stress test that showed no reversible ischemia and a preserved EF greater than 70%. She reports that she's been undergoing rehabilitation at home and feels that her shortness of breath is improving. It is now a point where she does not feel that she needs oxygen. We went ahead and ambulated her around the office today and her baseline SpO2 was 98% and decreased to 95% with exertion. This is markedly improved  from her previous office visit where her oxygen saturation dropped to 74% on room air.  Also at her last office visit I started her on amlodipine 10 mg daily and this helped both lower blood pressure and I suspect will ask her pulmonary artery pressures as well.  I saw Alexis [name] back today from her recent hospitalization. She was also due for annual follow-up. She was recently admitted for what sounds like a diastolic heart failure exacerbation, possibly precipitated by pneumonia and there was clear medication noncompliance. She was not taking her Lasix as prescribed. Also there may been some dietary noncompliance. She had about 6-8 pounds of weight gain which was diuresed down to 151 pounds on discharge. She is currently weighing 156 pounds at home and waited at 158 pounds in the office. She reports no significant shortness of breath and has been "religious" about taking her Lasix on a daily basis. She says she is eating more than she had before she is currently living with her daughter.  Alexis [name] was again hospitalized for diastolic heart failure/COPD. Again there is suspicion of possible noncompliance. She diuresed and was given antibiotics/steroids. Breathing is somewhat improved. She is currently on 3 L continuous oxygen. She has not been back to see her pulmonologist yet. She was placed on Lasix 60 mg daily after a pulse dose of Lasix 120 mg for 3 days. She is currently complaining of leg swelling and some shortness of breath with mild exertion.  I saw Alexis [name] again recently in the hospital this time in the setting of  acute GI bleeding. She was found to be anemic however a source of her bleeding was not identified. She did undergo EGD. She was also found to have new onset atrial fibrillation. Her CHADSVASC score is 6, therefore ultimately anticoagulation must be considered although at the time with her active bleeding it was not pursued. We talked at length today in the office about  anticoagulation and her stroke risk and she is deathly afraid of stroke, which is very reasonable given her risk. Given the possible options for anticoagulation, the safest option may be warfarin which could be easily reversed if recurrent bleeding is noted. She currently reports dark stools but is on twice daily iron.  Alexis [name] returns today for follow-up. She was recently hospitalized at [facility] and treated by the hospital medicine service for acute congestive heart failure/hypoxia related to COPD. A repeat echo in January 2017 showed normal LV systolic function with diastolic dysfunction and high LV filling pressure. She was diuresed and blood pressure was treated as she was noted to be markedly hypertensive on admission. At discharge her weight was 62 kg and she was transitioned to torsemide 40 mg every morning and 20 mg every afternoon. She does have stage IV chronic kidney disease which is been stable. She was taken off of aspirin but remains on warfarin for anticoagulation for her A. fib and CHADSVASC score of 6. She reports her weight has been stable. She denies any worsening shortness of breath. With a long discussion today about salt avoidance and it seems that there may be some dietary indiscretion as a cause of some of her recurrent heart failure exacerbations.  10/13/2015  Alexis [name] returns today for follow-up from her recent hospitalization. I saw her while she was hospitalized and help treat her for diastolic heart failure. This was thought related to atrial fibrillation which she was in more persistently. After we diuresed her, I cardioverted her and she has maintained a sinus bradycardia since then. She had some additional diuresis on follow-up with Theodore Demark, PA-C. Her weight is now down 3 additional pounds to a dry weight of 128 pounds. She said this is the best she's felt in several months. She is also made efforts to reduce sodium in her diet and is watching her weight very  carefully.  PMHx:  Past Medical History  Diagnosis Date  . Hypertensive heart disease   . Neuropathy (HCC)   . Anxiety   . PONV (postoperative nausea and vomiting)   . Chronic diastolic CHF (congestive heart failure) (HCC)     a. 05/2015 Echo: EF 55-60%, mild LVH, no rwma, mild MR, mildly dil RA, mod TR, PASP .  Marland Kitchen COPD (chronic obstructive pulmonary disease) (HCC)     a. on home O2 @ 3lpm.  . Family history of adverse reaction to anesthesia   . Type II diabetes mellitus (HCC)   . Macular degeneration, bilateral   . Varicose veins   . GIB (gastrointestinal bleeding)     a. History of GIB.  Marland Kitchen Paroxysmal atrial fibrillation (HCC)     a. CHA2DS2VASc = 6-->coumadin.  . Confusion state   . CKD (chronic kidney disease), stage IV (HCC)   . Elevated troponin     a. H/o elevated trop in setting of CHF; b. 06/2013 Lexiscan MV: nl EF, mild anterior breast attenuation, no ischemia.     Past Surgical History  Procedure Laterality Date  . Hip fracture surgery  2004  . Abdominal hysterectomy    . Vein  surgery    . Esophagogastroduodenoscopy N/A 12/01/2014    Procedure: ESOPHAGOGASTRODUODENOSCOPY (EGD);  Surgeon: Hilarie Fredrickson, MD;  Location: [facility] ENDOSCOPY;  Service: Endoscopy;  Laterality: N/A;  . Cardioversion N/A 09/22/2015    Procedure: CARDIOVERSION;  Surgeon: Chrystie Nose, MD;  Location: [facility] ENDOSCOPY;  Service: Cardiovascular;  Laterality: N/A;    FAMHx:  Family History  Problem Relation Age of Onset  . Colon cancer Father 77    advanced when discovered, no surgery or therapy.  this led to his death  . Breast cancer Sister   . Cancer Brother   . Breast cancer Mother   . Lung cancer Brother   . Thyroid disease Neg Hx   . Heart attack Neg Hx   . Hypertension Mother     SOCHx:   reports that she has been passively smoking.  She has never used smokeless tobacco. She reports that she does not drink alcohol or use illicit drugs.  ALLERGIES:  Allergies  Allergen Reactions  .  Yellow Dyes (Non-Tartrazine) Nausea And Vomiting    "deathly allergic" No other information given to explain reaction. Per Daughter- this was an IV dye. Ok with yellow coloring on Coumadin tablets.   . Codeine Hives  . Other Other (See Comments)    novocaine broke mouth out  . Penicillins Hives  . Procaine Hives    ROS: Pertinent items noted in HPI and remainder of comprehensive ROS otherwise negative.  HOME MEDS: Current Outpatient Prescriptions  Medication Sig Dispense Refill  . albuterol (PROVENTIL HFA;VENTOLIN HFA) 108 (90 BASE) MCG/ACT inhaler Inhale 2 puffs into the lungs every 6 (six) hours as needed for wheezing or shortness of breath. 1 Inhaler 3  . cholecalciferol (VITAMIN D) 1000 UNITS tablet Take 1,000 Units by mouth every morning.     . escitalopram (LEXAPRO) 10 MG tablet Take 10 mg by mouth daily.    . fenofibrate 160 MG tablet Take 160 mg by mouth daily.    Marland Kitchen gabapentin (NEURONTIN) 100 MG capsule Take 1 capsule (100 mg total) by mouth at bedtime. 30 capsule 0  . hydrALAZINE (APRESOLINE) 25 MG tablet Take 1 tablet (25 mg total) by mouth every 12 (twelve) hours. 60 tablet 3  . iron polysaccharides (NIFEREX) 150 MG capsule Take 1 capsule (150 mg total) by mouth daily. 30 capsule 3  . isosorbide mononitrate (IMDUR) 30 MG 24 hr tablet TAKE 1 TABLET BY MOUTH EVERY DAY 30 tablet 6  . methimazole (TAPAZOLE) 5 MG tablet Take 1 tablet (5 mg total) by mouth daily. 30 tablet 3  . metoprolol tartrate (LOPRESSOR) 25 MG tablet Take 0.5 tablets (12.5 mg total) by mouth 2 (two) times daily. 60 tablet 3  . Multiple Vitamin (MULTIVITAMIN WITH MINERALS) TABS tablet Take 1 tablet by mouth daily.    . OXYGEN Inhale 3 L into the lungs at bedtime.     . potassium chloride SA (K-DUR,KLOR-CON) 20 MEQ tablet Take 20 mEq by mouth daily.    Marland Kitchen torsemide (DEMADEX) 20 MG tablet Take 2 tablets by mouth in the morning and 1 tablet by mouth at night.    . warfarin (COUMADIN) 2.5 MG tablet Take 1 tablet  (2.5 mg total) by mouth daily at 6 PM. 30 tablet 2  . atorvastatin (LIPITOR) 20 MG tablet Take 20 mg by mouth at bedtime.     No current facility-administered medications for this visit.    LABS/IMAGING: No results found for this or any previous visit (from the past 48  hour(s)). No results found.  VITALS: BP 160/52 mmHg  Pulse 54  Ht 5\' 2"  (1.575 m)  Wt 128 lb (58.06 kg)  BMI 23.41 kg/m2  EXAM: General appearance: alert and no distress Neck: no carotid bruit and no JVD Lungs: diminished breath sounds bibasilar Heart: regular rate and rhythm, S1, S2 normal, no murmur, click, rub or gallop Abdomen: soft, non-tender; bowel sounds normal; no masses,  no organomegaly Extremities: extremities normal, atraumatic, no cyanosis or edema Pulses: 2+ and symmetric Skin: Skin color, texture, turgor normal. No rashes or lesions Neurologic: Grossly normal Psych: Mood, affect normal  EKG: Deferred  ASSESSMENT: 1. Recent acute diastolic heart failure 2. Persistent atrial fibrillation-CHADSVASC score of 6 on warfarin, s/p cardioversion 3. Anemia  4. Hypertensive heart disease with moderate LVH 5. Hypertension - controlled 6. Dyslipidemia 7. Diabetes type 2 8. Family history of heart disease 9. COPD on 3 L O2 continuous  PLAN: 1.   Mrs. Justo seems to be doing better now that she is maintaining a sinus rhythm. Hopefully she will not require antiarrhythmic therapy. She is now maintaining a dry weight of 128 pounds. We will continue to watch her closely for development of possible heart failure. She should continue to watch her fluid and salt intake. She has weaned down off of oxygen with regards to COPD and this likely means that a lot of her oxygen requirement was related to diastolic heart failure. Blood pressure is slightly elevated today but has been well controlled.  Follow-up with me in 3 months.  Chrystie Nose, MD, [facility] Attending Cardiologist [facility]  Lisette Abu  Hilty 10/13/2015, 1:14 PM

## 2015-10-20 ENCOUNTER — Ambulatory Visit (INDEPENDENT_AMBULATORY_CARE_PROVIDER_SITE_OTHER): Payer: Medicare Other | Admitting: Pharmacist Clinician (PhC)/ Clinical Pharmacy Specialist

## 2015-10-20 DIAGNOSIS — Z7901 Long term (current) use of anticoagulants: Secondary | ICD-10-CM

## 2015-10-20 DIAGNOSIS — I480 Paroxysmal atrial fibrillation: Secondary | ICD-10-CM

## 2015-10-20 DIAGNOSIS — I481 Persistent atrial fibrillation: Secondary | ICD-10-CM | POA: Diagnosis not present

## 2015-10-20 DIAGNOSIS — I4819 Other persistent atrial fibrillation: Secondary | ICD-10-CM

## 2015-10-20 LAB — POCT INR: INR: 1.3

## 2015-10-21 ENCOUNTER — Encounter (HOSPITAL_COMMUNITY): Payer: Self-pay | Admitting: Nurse Practitioner

## 2015-10-21 ENCOUNTER — Inpatient Hospital Stay (HOSPITAL_COMMUNITY)
Admission: EM | Admit: 2015-10-21 | Discharge: 2015-10-23 | DRG: 291 | Disposition: A | Discharge status: Home Health | Payer: Medicare Other | Attending: Family Medicine | Admitting: Family Medicine

## 2015-10-21 ENCOUNTER — Emergency Department (HOSPITAL_COMMUNITY): Payer: Medicare Other

## 2015-10-21 DIAGNOSIS — F329 Major depressive disorder, single episode, unspecified: Secondary | ICD-10-CM | POA: Diagnosis present

## 2015-10-21 DIAGNOSIS — Z880 Allergy status to penicillin: Secondary | ICD-10-CM | POA: Diagnosis not present

## 2015-10-21 DIAGNOSIS — Z885 Allergy status to narcotic agent status: Secondary | ICD-10-CM | POA: Diagnosis not present

## 2015-10-21 DIAGNOSIS — E0590 Thyrotoxicosis, unspecified without thyrotoxic crisis or storm: Secondary | ICD-10-CM | POA: Diagnosis present

## 2015-10-21 DIAGNOSIS — D638 Anemia in other chronic diseases classified elsewhere: Secondary | ICD-10-CM | POA: Diagnosis present

## 2015-10-21 DIAGNOSIS — R9431 Abnormal electrocardiogram [ECG] [EKG]: Secondary | ICD-10-CM

## 2015-10-21 DIAGNOSIS — Z66 Do not resuscitate: Secondary | ICD-10-CM | POA: Diagnosis present

## 2015-10-21 DIAGNOSIS — R42 Dizziness and giddiness: Secondary | ICD-10-CM | POA: Diagnosis not present

## 2015-10-21 DIAGNOSIS — Z9102 Food additives allergy status: Secondary | ICD-10-CM | POA: Diagnosis not present

## 2015-10-21 DIAGNOSIS — J441 Chronic obstructive pulmonary disease with (acute) exacerbation: Secondary | ICD-10-CM | POA: Diagnosis present

## 2015-10-21 DIAGNOSIS — J9611 Chronic respiratory failure with hypoxia: Secondary | ICD-10-CM | POA: Diagnosis present

## 2015-10-21 DIAGNOSIS — Z9981 Dependence on supplemental oxygen: Secondary | ICD-10-CM

## 2015-10-21 DIAGNOSIS — I5023 Acute on chronic systolic (congestive) heart failure: Secondary | ICD-10-CM

## 2015-10-21 DIAGNOSIS — D509 Iron deficiency anemia, unspecified: Secondary | ICD-10-CM | POA: Diagnosis present

## 2015-10-21 DIAGNOSIS — I480 Paroxysmal atrial fibrillation: Secondary | ICD-10-CM | POA: Diagnosis present

## 2015-10-21 DIAGNOSIS — H3530 Unspecified macular degeneration: Secondary | ICD-10-CM | POA: Diagnosis present

## 2015-10-21 DIAGNOSIS — N184 Chronic kidney disease, stage 4 (severe): Secondary | ICD-10-CM | POA: Diagnosis present

## 2015-10-21 DIAGNOSIS — Z7901 Long term (current) use of anticoagulants: Secondary | ICD-10-CM

## 2015-10-21 DIAGNOSIS — I509 Heart failure, unspecified: Secondary | ICD-10-CM

## 2015-10-21 DIAGNOSIS — Z7722 Contact with and (suspected) exposure to environmental tobacco smoke (acute) (chronic): Secondary | ICD-10-CM | POA: Diagnosis present

## 2015-10-21 DIAGNOSIS — E1142 Type 2 diabetes mellitus with diabetic polyneuropathy: Secondary | ICD-10-CM | POA: Diagnosis present

## 2015-10-21 DIAGNOSIS — E1122 Type 2 diabetes mellitus with diabetic chronic kidney disease: Secondary | ICD-10-CM | POA: Diagnosis present

## 2015-10-21 DIAGNOSIS — F419 Anxiety disorder, unspecified: Secondary | ICD-10-CM | POA: Diagnosis present

## 2015-10-21 DIAGNOSIS — I130 Hypertensive heart and chronic kidney disease with heart failure and stage 1 through stage 4 chronic kidney disease, or unspecified chronic kidney disease: Principal | ICD-10-CM | POA: Diagnosis present

## 2015-10-21 DIAGNOSIS — Z888 Allergy status to other drugs, medicaments and biological substances status: Secondary | ICD-10-CM

## 2015-10-21 DIAGNOSIS — I5033 Acute on chronic diastolic (congestive) heart failure: Secondary | ICD-10-CM | POA: Diagnosis present

## 2015-10-21 DIAGNOSIS — Z79899 Other long term (current) drug therapy: Secondary | ICD-10-CM | POA: Diagnosis not present

## 2015-10-21 DIAGNOSIS — R7989 Other specified abnormal findings of blood chemistry: Secondary | ICD-10-CM | POA: Diagnosis not present

## 2015-10-21 DIAGNOSIS — Z8249 Family history of ischemic heart disease and other diseases of the circulatory system: Secondary | ICD-10-CM

## 2015-10-21 DIAGNOSIS — I4891 Unspecified atrial fibrillation: Secondary | ICD-10-CM

## 2015-10-21 LAB — CBC WITH DIFFERENTIAL/PLATELET
Basophils Absolute: 0 10*3/uL (ref 0.0–0.1)
Basophils Relative: 0 %
EOS ABS: 0.1 10*3/uL (ref 0.0–0.7)
EOS PCT: 1 %
HCT: 30.9 % — ABNORMAL LOW (ref 36.0–46.0)
Hemoglobin: 9.1 g/dL — ABNORMAL LOW (ref 12.0–15.0)
LYMPHS ABS: 0.6 10*3/uL — AB (ref 0.7–4.0)
LYMPHS PCT: 7 %
MCH: 24 pg — AB (ref 26.0–34.0)
MCHC: 29.4 g/dL — ABNORMAL LOW (ref 30.0–36.0)
MCV: 81.5 fL (ref 78.0–100.0)
Monocytes Absolute: 0.5 10*3/uL (ref 0.1–1.0)
Monocytes Relative: 6 %
NEUTROS ABS: 6.8 10*3/uL (ref 1.7–7.7)
NEUTROS PCT: 86 %
PLATELETS: 236 10*3/uL (ref 150–400)
RBC: 3.79 MIL/uL — AB (ref 3.87–5.11)
RDW: 18.8 % — ABNORMAL HIGH (ref 11.5–15.5)
WBC: 8 10*3/uL (ref 4.0–10.5)

## 2015-10-21 LAB — BASIC METABOLIC PANEL
ANION GAP: 12 (ref 5–15)
BUN: 32 mg/dL — ABNORMAL HIGH (ref 6–20)
CALCIUM: 9.3 mg/dL (ref 8.9–10.3)
CHLORIDE: 105 mmol/L (ref 101–111)
CO2: 22 mmol/L (ref 22–32)
Creatinine, Ser: 1.73 mg/dL — ABNORMAL HIGH (ref 0.44–1.00)
GFR calc Af Amer: 31 mL/min — ABNORMAL LOW (ref >60)
GFR calc non Af Amer: 26 mL/min — ABNORMAL LOW (ref >60)
GLUCOSE: 156 mg/dL — AB (ref 65–99)
Potassium: 3.4 mmol/L — ABNORMAL LOW (ref 3.5–5.1)
SODIUM: 139 mmol/L (ref 135–145)

## 2015-10-21 LAB — I-STAT CG4 LACTIC ACID, ED
LACTIC ACID, VENOUS: 1.61 mmol/L (ref 0.5–2.0)
LACTIC ACID, VENOUS: 1.11 mmol/L (ref 0.5–2.0)

## 2015-10-21 LAB — COMPREHENSIVE METABOLIC PANEL
ALBUMIN: 3.1 g/dL — AB (ref 3.5–5.0)
ALT: 19 U/L (ref 14–54)
AST: 26 U/L (ref 15–41)
Alkaline Phosphatase: 73 U/L (ref 38–126)
Anion gap: 10 (ref 5–15)
BILIRUBIN TOTAL: 0.8 mg/dL (ref 0.3–1.2)
BUN: 32 mg/dL — ABNORMAL HIGH (ref 6–20)
CALCIUM: 9.4 mg/dL (ref 8.9–10.3)
CO2: 26 mmol/L (ref 22–32)
CREATININE: 1.76 mg/dL — AB (ref 0.44–1.00)
Chloride: 104 mmol/L (ref 101–111)
GFR calc non Af Amer: 26 mL/min — ABNORMAL LOW (ref >60)
GFR, EST AFRICAN AMERICAN: 30 mL/min — AB (ref >60)
GLUCOSE: 222 mg/dL — AB (ref 65–99)
Potassium: 3.8 mmol/L (ref 3.5–5.1)
Sodium: 140 mmol/L (ref 135–145)
TOTAL PROTEIN: 6.9 g/dL (ref 6.5–8.1)

## 2015-10-21 LAB — GLUCOSE, CAPILLARY
Glucose-Capillary: 212 mg/dL — ABNORMAL HIGH (ref 65–99)
Glucose-Capillary: 318 mg/dL — ABNORMAL HIGH (ref 65–99)

## 2015-10-21 LAB — BRAIN NATRIURETIC PEPTIDE: B Natriuretic Peptide: 1283.4 pg/mL — ABNORMAL HIGH (ref 0.0–100.0)

## 2015-10-21 LAB — INFLUENZA PANEL BY PCR (TYPE A & B)
H1N1 flu by pcr: NOT DETECTED
INFLBPCR: NEGATIVE
Influenza A By PCR: NEGATIVE

## 2015-10-21 LAB — TSH: TSH: 0.97 u[IU]/mL (ref 0.350–4.500)

## 2015-10-21 LAB — TROPONIN I
Troponin I: <0.03 ng/mL (ref <0.031)
Troponin I: 0.03 ng/mL (ref <0.031)
Troponin I: 0.05 ng/mL — ABNORMAL HIGH (ref <0.031)

## 2015-10-21 LAB — PROTIME-INR
INR: 1.3 (ref 0.00–1.49)
PROTHROMBIN TIME: 16.3 s — AB (ref 11.6–15.2)

## 2015-10-21 LAB — MAGNESIUM: MAGNESIUM: 2 mg/dL (ref 1.7–2.4)

## 2015-10-21 MED ORDER — ESCITALOPRAM OXALATE 10 MG PO TABS
10.0000 mg | ORAL_TABLET | Freq: Every day | ORAL | Status: DC
  Administered 2015-10-21 – 2015-10-23 (×3): 10 mg via ORAL
  Filled 2015-10-21 (×3): qty 1

## 2015-10-21 MED ORDER — SODIUM CHLORIDE 0.9 % IV SOLN: 250.0000 mL | INTRAVENOUS | Status: DC | PRN

## 2015-10-21 MED ORDER — FUROSEMIDE 10 MG/ML IJ SOLN
40.0000 mg | Freq: Once | INTRAMUSCULAR | Status: AC
  Administered 2015-10-21: 40 mg via INTRAVENOUS
  Filled 2015-10-21: qty 4

## 2015-10-21 MED ORDER — POLYSACCHARIDE IRON COMPLEX 150 MG PO CAPS
150.0000 mg | ORAL_CAPSULE | Freq: Every day | ORAL | Status: DC
  Administered 2015-10-22 – 2015-10-23 (×2): 150 mg via ORAL
  Filled 2015-10-21 (×3): qty 1

## 2015-10-21 MED ORDER — GABAPENTIN 100 MG PO CAPS
100.0000 mg | ORAL_CAPSULE | Freq: Every day | ORAL | Status: DC
  Administered 2015-10-21 – 2015-10-22 (×2): 100 mg via ORAL
  Filled 2015-10-21 (×2): qty 1

## 2015-10-21 MED ORDER — ATORVASTATIN CALCIUM 20 MG PO TABS
20.0000 mg | ORAL_TABLET | Freq: Every day | ORAL | Status: DC
  Administered 2015-10-21 – 2015-10-22 (×2): 20 mg via ORAL
  Filled 2015-10-21 (×2): qty 1

## 2015-10-21 MED ORDER — GABAPENTIN 100 MG PO CAPS: 100.0000 mg | ORAL_CAPSULE | Freq: Every day | ORAL | Status: DC

## 2015-10-21 MED ORDER — HEPARIN (PORCINE) IN NACL 100-0.45 UNIT/ML-% IJ SOLN
950.0000 [IU]/h | INTRAMUSCULAR | Status: DC
  Administered 2015-10-21: 850 [IU]/h via INTRAVENOUS
  Administered 2015-10-22: 950 [IU]/h via INTRAVENOUS
  Filled 2015-10-21 (×3): qty 250

## 2015-10-21 MED ORDER — SODIUM CHLORIDE 0.9% FLUSH
3.0000 mL | Freq: Two times a day (BID) | INTRAVENOUS | Status: DC
  Administered 2015-10-21 – 2015-10-23 (×3): 3 mL via INTRAVENOUS

## 2015-10-21 MED ORDER — METHIMAZOLE 5 MG PO TABS
5.0000 mg | ORAL_TABLET | Freq: Every day | ORAL | Status: DC
  Administered 2015-10-21 – 2015-10-23 (×3): 5 mg via ORAL
  Filled 2015-10-21 (×2): qty 1

## 2015-10-21 MED ORDER — WARFARIN SODIUM 5 MG PO TABS
5.0000 mg | ORAL_TABLET | Freq: Once | ORAL | Status: AC
Start: 2015-10-21 — End: 2015-10-21
  Administered 2015-10-21: 5 mg via ORAL
  Filled 2015-10-21 (×2): qty 1

## 2015-10-21 MED ORDER — FUROSEMIDE 10 MG/ML IJ SOLN
60.0000 mg | Freq: Two times a day (BID) | INTRAMUSCULAR | Status: DC
  Administered 2015-10-22: 60 mg via INTRAVENOUS
  Filled 2015-10-21: qty 6

## 2015-10-21 MED ORDER — METOPROLOL TARTRATE 25 MG PO TABS
25.0000 mg | ORAL_TABLET | Freq: Two times a day (BID) | ORAL | Status: DC
  Administered 2015-10-21 – 2015-10-23 (×3): 25 mg via ORAL
  Filled 2015-10-21 (×3): qty 1
  Filled 2015-10-21: qty 2

## 2015-10-21 MED ORDER — HYDRALAZINE HCL 25 MG PO TABS
25.0000 mg | ORAL_TABLET | Freq: Two times a day (BID) | ORAL | Status: DC
  Administered 2015-10-21 – 2015-10-23 (×4): 25 mg via ORAL
  Filled 2015-10-21 (×4): qty 1

## 2015-10-21 MED ORDER — FENOFIBRATE 160 MG PO TABS
160.0000 mg | ORAL_TABLET | Freq: Every day | ORAL | Status: DC
  Administered 2015-10-21 – 2015-10-23 (×3): 160 mg via ORAL
  Filled 2015-10-21 (×3): qty 1

## 2015-10-21 MED ORDER — ISOSORBIDE MONONITRATE ER 30 MG PO TB24
30.0000 mg | ORAL_TABLET | Freq: Every day | ORAL | Status: DC
  Administered 2015-10-21 – 2015-10-23 (×3): 30 mg via ORAL
  Filled 2015-10-21 (×3): qty 1

## 2015-10-21 MED ORDER — FUROSEMIDE 10 MG/ML IJ SOLN
80.0000 mg | Freq: Two times a day (BID) | INTRAMUSCULAR | Status: DC
  Filled 2015-10-21 (×2): qty 8

## 2015-10-21 MED ORDER — SODIUM CHLORIDE 0.9% FLUSH: 3.0000 mL | INTRAVENOUS | Status: DC | PRN

## 2015-10-21 MED ORDER — FUROSEMIDE 10 MG/ML IJ SOLN: 100.0000 mg | Freq: Two times a day (BID) | INTRAVENOUS | Status: DC

## 2015-10-21 MED ORDER — FUROSEMIDE 10 MG/ML IJ SOLN
100.0000 mg | Freq: Two times a day (BID) | INTRAVENOUS | Status: DC
  Filled 2015-10-21 (×2): qty 10

## 2015-10-21 MED ORDER — ALBUTEROL SULFATE (2.5 MG/3ML) 0.083% IN NEBU: 3.0000 mL | INHALATION_SOLUTION | Freq: Four times a day (QID) | RESPIRATORY_TRACT | Status: DC | PRN

## 2015-10-21 MED ORDER — POTASSIUM CHLORIDE CRYS ER 20 MEQ PO TBCR
40.0000 meq | EXTENDED_RELEASE_TABLET | Freq: Once | ORAL | Status: AC
  Administered 2015-10-21: 40 meq via ORAL
  Filled 2015-10-21: qty 2

## 2015-10-21 MED ORDER — ACETAMINOPHEN 325 MG PO TABS: 650.0000 mg | ORAL_TABLET | ORAL | Status: DC | PRN

## 2015-10-21 MED ORDER — METOPROLOL TARTRATE 5 MG/5ML IV SOLN
5.0000 mg | Freq: Once | INTRAVENOUS | Status: AC
  Administered 2015-10-21: 5 mg via INTRAVENOUS
  Filled 2015-10-21: qty 5

## 2015-10-21 MED ORDER — LEVOFLOXACIN IN D5W 750 MG/150ML IV SOLN
750.0000 mg | Freq: Once | INTRAVENOUS | Status: AC
  Administered 2015-10-21: 750 mg via INTRAVENOUS
  Filled 2015-10-21: qty 150

## 2015-10-21 MED ORDER — METOPROLOL TARTRATE 12.5 MG HALF TABLET: 12.5000 mg | ORAL_TABLET | Freq: Two times a day (BID) | ORAL | Status: DC

## 2015-10-21 MED ORDER — ONDANSETRON HCL 4 MG/2ML IJ SOLN: 4.0000 mg | Freq: Four times a day (QID) | INTRAMUSCULAR | Status: DC | PRN

## 2015-10-21 MED ORDER — WARFARIN - PHARMACIST DOSING INPATIENT
Freq: Every day | Status: DC
  Administered 2015-10-21: 18:00:00

## 2015-10-21 NOTE — ED Notes (Signed)
Reddened area 4cm x 6cm remain to coccyx/both buttocks with two crusted scab-like areas remaining.  Pt stated "that's always there because I lay in my recliner and bed all the time.

## 2015-10-21 NOTE — ED Provider Notes (Signed)
CSN: [id number]     Arrival date & time    History   First MD Initiated Contact with Patient 10/21/15 1007     Chief Complaint  Patient presents with  . Shortness of Breath     (Consider location/radiation/quality/duration/timing/severity/associated sxs/prior Treatment) HPI Comments: Patient with a history of diastolic CHF, COPD on chronic oxygen, Paroxysmal Atrial Fibrillation, CKD presents today with complaints of SOB and productive cough.  She states that she is coughing up yellowish colored sputum.  She reports onset of symptoms yesterday and states that symptoms are gradually worsening.  She has not taken anything for her symptoms prior to arrival.  She reports that she wears 3 L [place] oxygen at night and also as needed throughout the day at baseline.  She denies any fever, chills, chest pain, nausea, vomiting, dizziness, syncope, or LE edema/pain.  She is currently anticoagulated with Coumadin for her history of Atrial Fibrillation.  She states that she has been taking the coumadin as directed.   The history is provided by the patient.    Past Medical History  Diagnosis Date  . Hypertensive heart disease   . Neuropathy (HCC)   . Anxiety   . PONV (postoperative nausea and vomiting)   . Chronic diastolic CHF (congestive heart failure) (HCC)     a. 05/2015 Echo: EF 55-60%, mild LVH, no rwma, mild MR, mildly dil RA, mod TR, PASP .  Marland Kitchen COPD (chronic obstructive pulmonary disease) (HCC)     a. on home O2 @ 3lpm.  . Family history of adverse reaction to anesthesia   . Type II diabetes mellitus (HCC)   . Macular degeneration, bilateral   . Varicose veins   . GIB (gastrointestinal bleeding)     a. History of GIB.  Marland Kitchen Paroxysmal atrial fibrillation (HCC)     a. CHA2DS2VASc = 6-->coumadin.  . Confusion state   . CKD (chronic kidney disease), stage IV (HCC)   . Elevated troponin     a. H/o elevated trop in setting of CHF; b. 06/2013 Lexiscan MV: nl EF, mild anterior breast attenuation,  no ischemia.    Past Surgical History  Procedure Laterality Date  . Hip fracture surgery  2004  . Abdominal hysterectomy    . Vein surgery    . Esophagogastroduodenoscopy N/A 12/01/2014    Procedure: ESOPHAGOGASTRODUODENOSCOPY (EGD);  Surgeon: Hilarie Fredrickson, MD;  Location: [facility] ENDOSCOPY;  Service: Endoscopy;  Laterality: N/A;  . Cardioversion N/A 09/22/2015    Procedure: CARDIOVERSION;  Surgeon: Chrystie Nose, MD;  Location: [facility] ENDOSCOPY;  Service: Cardiovascular;  Laterality: N/A;   Family History  Problem Relation Age of Onset  . Colon cancer Father 62    advanced when discovered, no surgery or therapy.  this led to his death  . Breast cancer Sister   . Cancer Brother   . Breast cancer Mother   . Lung cancer Brother   . Thyroid disease Neg Hx   . Heart attack Neg Hx   . Hypertension Mother    Social History  Substance Use Topics  . Smoking status: Passive Smoke Exposure - Never Smoker  . Smokeless tobacco: Never Used  . Alcohol Use: No   OB History    No data available     Review of Systems  All other systems reviewed and are negative.     Allergies  Yellow dyes (non-tartrazine); Codeine; Other; Penicillins; and Procaine  Home Medications   Prior to Admission medications   Medication Sig Start Date  End Date Taking? Authorizing Provider  albuterol (PROVENTIL HFA;VENTOLIN HFA) 108 (90 BASE) MCG/ACT inhaler Inhale 2 puffs into the lungs every 6 (six) hours as needed for wheezing or shortness of breath. 06/11/13   Nishant Dhungel, MD  atorvastatin (LIPITOR) 20 MG tablet Take 20 mg by mouth at bedtime. 10/12/14 10/12/15  Historical Provider, MD  cholecalciferol (VITAMIN D) 1000 UNITS tablet Take 1,000 Units by mouth every morning.     Historical Provider, MD  escitalopram (LEXAPRO) 10 MG tablet Take 10 mg by mouth daily.    Historical Provider, MD  fenofibrate 160 MG tablet Take 160 mg by mouth daily.    Historical Provider, MD  gabapentin (NEURONTIN) 100 MG capsule Take 1  capsule (100 mg total) by mouth at bedtime. 08/05/15   Rodolph Bong, MD  hydrALAZINE (APRESOLINE) 25 MG tablet Take 1 tablet (25 mg total) by mouth every 12 (twelve) hours. 09/22/15   Ripudeep Jenna Luo, MD  iron polysaccharides (NIFEREX) 150 MG capsule Take 1 capsule (150 mg total) by mouth daily. 09/22/15   Ripudeep Jenna Luo, MD  isosorbide mononitrate (IMDUR) 30 MG 24 hr tablet TAKE 1 TABLET BY MOUTH EVERY DAY 01/31/15   Chrystie Nose, MD  methimazole (TAPAZOLE) 5 MG tablet Take 1 tablet (5 mg total) by mouth daily. 09/22/15   Ripudeep Jenna Luo, MD  metoprolol tartrate (LOPRESSOR) 25 MG tablet Take 0.5 tablets (12.5 mg total) by mouth 2 (two) times daily. 09/22/15   Ripudeep Jenna Luo, MD  Multiple Vitamin (MULTIVITAMIN WITH MINERALS) TABS tablet Take 1 tablet by mouth daily.    Historical Provider, MD  OXYGEN Inhale 3 L into the lungs at bedtime.     Historical Provider, MD  potassium chloride SA (K-DUR,KLOR-CON) 20 MEQ tablet Take 20 mEq by mouth daily.    Historical Provider, MD  torsemide (DEMADEX) 20 MG tablet Take 2 tablets by mouth in the morning and 1 tablet by mouth at night.    Historical Provider, MD  warfarin (COUMADIN) 2.5 MG tablet Take 1 tablet (2.5 mg total) by mouth daily at 6 PM. 06/23/15   Meredeth Ide, MD   BP 175/50 mmHg  Pulse 106  Temp(Src) 99.3 F (37.4 C) (Oral)  Resp 20  Ht 5\' 2"  (1.575 m)  Wt 58.06 kg  BMI 23.41 kg/m2  SpO2 97% Physical Exam  Constitutional: She appears well-developed and well-nourished.  HENT:  Head: Normocephalic and atraumatic.  Mouth/Throat: Oropharynx is clear and moist.  Neck: Normal range of motion. Neck supple.  Cardiovascular: Normal rate, regular rhythm and normal heart sounds.   Pulmonary/Chest: Effort normal. No respiratory distress. She has no wheezes.  Bilateral crackles at the base of both lungs  Musculoskeletal: Normal range of motion.  No LE edema or erythema  Neurological: She is alert.  Skin: Skin is warm and dry.  Psychiatric: She  has a normal mood and affect.  Nursing note and vitals reviewed.   ED Course  Procedures (including critical care time) Labs Review Labs Reviewed  CBC WITH DIFFERENTIAL/PLATELET - Abnormal; Notable for the following:    RBC 3.79 (*)    Hemoglobin 9.1 (*)    HCT 30.9 (*)    MCH 24.0 (*)    MCHC 29.4 (*)    RDW 18.8 (*)    Lymphs Abs 0.6 (*)    All other components within normal limits  BASIC METABOLIC PANEL - Abnormal; Notable for the following:    Potassium 3.4 (*)    Glucose, Bld  156 (*)    BUN 32 (*)    Creatinine, Ser 1.73 (*)    GFR calc non Af Amer 26 (*)    GFR calc Af Amer 31 (*)    All other components within normal limits  BRAIN NATRIURETIC PEPTIDE - Abnormal; Notable for the following:    B Natriuretic Peptide 1283.4 (*)    All other components within normal limits  PROTIME-INR - Abnormal; Notable for the following:    Prothrombin Time 16.3 (*)    All other components within normal limits  CULTURE, BLOOD (ROUTINE X 2)  CULTURE, BLOOD (ROUTINE X 2)  TROPONIN I  TROPONIN I  TROPONIN I  TROPONIN I  HEPARIN LEVEL (UNFRACTIONATED)  I-STAT CG4 LACTIC ACID, ED  I-STAT CG4 LACTIC ACID, ED    Imaging Review Dg Chest 2 View  10/21/2015  CLINICAL DATA:  Respiratory distress. EXAM: CHEST  2 VIEW COMPARISON:  Sep 26, 2015. FINDINGS: Stable cardiomegaly. No pneumothorax is noted. Minimal bilateral pleural effusions are noted posteriorly. Mild central pulmonary vascular congestion is noted with probable bilateral perihilar and basilar edema. Bony thorax is unremarkable. IMPRESSION: Cardiomegaly with mild bilateral pulmonary edema and minimal pleural effusions are noted consistent with congestive heart failure. Electronically Signed   By: Lupita Raider, M.D.   On: 10/21/2015 11:42   I have personally reviewed and evaluated these images and lab results as part of my medical decision-making.   EKG Interpretation   Date/Time:  Saturday Oct 21 2015 10:03:30 EDT Ventricular  Rate:  111 PR Interval:    QRS Duration: 84 QT Interval:  355 QTC Calculation: 482 R Axis:   22 Text Interpretation:  Atrial fibrillation Probable LVH with secondary  repol abnrm ST depression, consider ischemia, diffuse lds inferior and  lateral ST depressions are new Confirmed by Manus Gunning  MD, STEPHEN [phone])  on 10/21/2015 10:13:20 AM      MDM   Final diagnoses:  None   Patient with a history of a fib on chronic coumadin, CHF, and COPD presents today with SOB and productive cough since last evening. She denies any CP or any other pain.   She is on chronic 3 L oxygen during the night and intermittently during the day.  She has a temp of 99.6 F upon arrival in the ED.  BNP is 1283.  CXR showing evidence of CHF on exam.  Patient admitted to Internal medicine for additional management of CHF exacerbation.      Santiago Glad, PA-C 10/24/15 8119  Glynn Octave, MD 10/25/15 [phone]

## 2015-10-21 NOTE — Progress Notes (Signed)
ANTICOAGULATION CONSULT NOTE - Initial Consult  Pharmacy Consult for Heparin + Coumadin  Indication: atrial fibrillation  Allergies  Allergen Reactions  . Yellow Dyes (Non-Tartrazine) Nausea And Vomiting    "deathly allergic" No other information given to explain reaction. Per Daughter- this was an IV dye. Ok with yellow coloring on Coumadin tablets.   . Codeine Hives  . Other Other (See Comments)    novocaine broke mouth out  . Penicillins Hives  . Procaine Hives    Patient Measurements: Height: 5\' 2"  (157.5 cm) Weight: 128 lb (58.06 kg) IBW/kg (Calculated) : 50.1 Heparin Dosing Weight: 58 kg   Vital Signs: Temp: 99.6 F (37.6 C) (05/27 1206) Temp Source: Rectal (05/27 1206) BP: 143/73 mmHg (05/27 1300) Pulse Rate: 109 (05/27 1300)  Labs:  Recent Labs  10/20/15 1026 10/21/15 1056  HGB  --  9.1*  HCT  --  30.9*  PLT  --  236  LABPROT  --  16.3*  INR 1.3 1.30  CREATININE  --  1.73*  TROPONINI  --  <0.03    Estimated Creatinine Clearance: 20.2 mL/min (by C-G formula based on Cr of 1.73).   Medical History: Past Medical History  Diagnosis Date  . Hypertensive heart disease   . Neuropathy (HCC)   . Anxiety   . PONV (postoperative nausea and vomiting)   . Chronic diastolic CHF (congestive heart failure) (HCC)     a. 05/2015 Echo: EF 55-60%, mild LVH, no rwma, mild MR, mildly dil RA, mod TR, PASP .  Marland Kitchen COPD (chronic obstructive pulmonary disease) (HCC)     a. on home O2 @ 3lpm.  . Family history of adverse reaction to anesthesia   . Type II diabetes mellitus (HCC)   . Macular degeneration, bilateral   . Varicose veins   . GIB (gastrointestinal bleeding)     a. History of GIB.  Marland Kitchen Paroxysmal atrial fibrillation (HCC)     a. CHA2DS2VASc = 6-->coumadin.  . Confusion state   . CKD (chronic kidney disease), stage IV (HCC)   . Elevated troponin     a. H/o elevated trop in setting of CHF; b. 06/2013 Lexiscan MV: nl EF, mild anterior breast attenuation, no  ischemia.     Medications:   (Not in a hospital admission)  Assessment: 30 YOF with h/o Afib on Coumadin prior to admission. INR is sub-therapeutic on admission. Pharmacy consulted to resume Coumadin and bridge with heparin. INR on admission is sub-therapeutic at 1.3.   Goal of Therapy:  INR 2-3 Heparin level 0.3-0.7 units/ml Monitor platelets by anticoagulation protocol: Yes   Plan:  -Coumadin 5 mg once today -Start heparin at 850 units/hr -F/u 8 hr HL -Monitor daily HL, CBC and s/s of bleeding   Vinnie Level, PharmD., BCPS Clinical Pharmacist Pager [phone]

## 2015-10-21 NOTE — ED Notes (Signed)
Patient transported to X-ray 

## 2015-10-21 NOTE — ED Notes (Signed)
Pt unable to ambulate to check pulse ox while ambulating

## 2015-10-21 NOTE — ED Notes (Signed)
Per EMS pt from home called out for respiratory distress pt has hx of COPD with chronic O2 via [place] 2-3L at night and PRN throughout day. Pt initial Sat88%/RA with expiratory wheezing throughout. Patient started one duoneb with lung sounds clear afterwards. Patient SpO2 90%/RA after treatment patient placed on 3L of O2- SpO2 97%/3L. Cough with yellow sputum x2 days.    CBG 90 Respirations 26

## 2015-10-21 NOTE — Consult Note (Signed)
Chief Complaint/Reason for Consult: diastolic CHF exacerbation, atrial fibrillation  Requesting Physician: McDiarmid   PCP:  Nicholes Rough, PA-C Primary Cardiologist:Kenneth Hilty  HPI: Alexis [name] is an 82 year old female with history of COPD, DM, AF, HTN, HFpEF, CKD (baseline CRT 2-2.5) presenting to the hospital after experiencing fairly acute onset of SOB last night associated with coughing and phlegm production. No chest pain, diaphoresis.  She has a chronic history of feeling lightheaded and dizzy and nausea when she lays flat, no acute worsening of this more recently.    She recently saw Dr. Debara Pickett in clinic, without complaints.  Her weight this admission is the same as that clinic visit as well.  She reports no issues with her medicines, although INR is sub therapeutic on admission.    She's required previous hospitalizations for diastolic heart failure exacerbations coinciding with atrial fibrillation requiring cardioversion.  This time, she feels that this is mostly related to a cold, and less related to fluid build up-- but she notes it is hard for her to tell the difference.  Typically, she will get some lower extremity swelling when her fluid is up, and she doesn't have any of that this time.      Previous Cardiac Studies: Lexiscan Spect: 07/21/13 - Low risk stress nuclear study with mild anterior wall breast attenuation artifact.  TTE 06/12/15 - Left ventricle: The cavity size was normal. Wall thickness was  increased in a pattern of mild LVH. Systolic function was normal.  The estimated ejection fraction was in the range of 55% to 60%.  Wall motion was normal; there were no regional wall motion  abnormalities. Doppler parameters are consistent with high  ventricular filling pressure. - Aortic valve: Valve area (VTI): 1.35 cm^2. Valve area (Vmax):  1.43 cm^2. Valve area (Vmean): 1.36 cm^2. - Mitral valve: There was mild regurgitation. - Right atrium: The atrium was  mildly dilated. - Tricuspid valve: There was moderate regurgitation. - Pulmonary arteries: PA peak pressure: 58 mm Hg (S).    Past Medical History  Diagnosis Date  . Hypertensive heart disease   . Neuropathy ([place])   . Anxiety   . PONV (postoperative nausea and vomiting)   . Chronic diastolic CHF (congestive heart failure) ([place])     a. 05/2015 Echo: EF 55-60%, mild LVH, no rwma, mild MR, mildly dil RA, mod TR, PASP 29mHg.  .Marland KitchenCOPD (chronic obstructive pulmonary disease) ([place]     a. on home O2 @ 3lpm.  . Family history of adverse reaction to anesthesia   . Type II diabetes mellitus ([place]   . Macular degeneration, bilateral   . Varicose veins   . GIB (gastrointestinal bleeding)     a. History of GIB.  .Marland KitchenParoxysmal atrial fibrillation (HCC)     a. CHA2DS2VASc = 6-->coumadin.  . Confusion state   . CKD (chronic kidney disease), stage IV ([place]   . Elevated troponin     a. H/o elevated trop in setting of CHF; b. 06/2013 Lexiscan MV: nl EF, mild anterior breast attenuation, no ischemia.     Past Surgical History  Procedure Laterality Date  . Hip fracture surgery  2004  . Abdominal hysterectomy    . Vein surgery    . Esophagogastroduodenoscopy N/A 12/01/2014    Procedure: ESOPHAGOGASTRODUODENOSCOPY (EGD);  Surgeon: JIrene Shipper MD;  [facility];  Service: Endoscopy;  Laterality: N/A;  . Cardioversion N/A 09/22/2015    Procedure: CARDIOVERSION;  Surgeon: KPixie Casino MD;  Location: [facility] ENDOSCOPY;  Service: Cardiovascular;  Laterality: N/A;    Family History  Problem Relation Age of Onset  . Colon cancer Father 67    advanced when discovered, no surgery or therapy.  this led to his death  . Breast cancer Sister   . Cancer Brother   . Breast cancer Mother   . Lung cancer Brother   . Thyroid disease Neg Hx   . Heart attack Neg Hx   . Hypertension Mother    Social History:  reports that she has been passively smoking.  She has never used smokeless tobacco. She  reports that she does not drink alcohol or use illicit drugs.  Allergies:  Allergies  Allergen Reactions  . Yellow Dyes (Non-Tartrazine) Nausea And Vomiting    "deathly allergic" No other information given to explain reaction. Per Daughter- this was an IV dye. Ok with yellow coloring on Coumadin tablets.   . Codeine Hives  . Other Other (See Comments)    novocaine broke mouth out  . Penicillins Hives  . Procaine Hives    No current facility-administered medications on file prior to encounter.   Current Outpatient Prescriptions on File Prior to Encounter  Medication Sig Dispense Refill  . albuterol (PROVENTIL HFA;VENTOLIN HFA) 108 (90 BASE) MCG/ACT inhaler Inhale 2 puffs into the lungs every 6 (six) hours as needed for wheezing or shortness of breath. 1 Inhaler 3  . cholecalciferol (VITAMIN D) 1000 UNITS tablet Take 1,000 Units by mouth every morning.     . escitalopram (LEXAPRO) 10 MG tablet Take 10 mg by mouth daily.    . fenofibrate 160 MG tablet Take 160 mg by mouth daily.    Marland Kitchen gabapentin (NEURONTIN) 100 MG capsule Take 1 capsule (100 mg total) by mouth at bedtime. 30 capsule 0  . hydrALAZINE (APRESOLINE) 25 MG tablet Take 1 tablet (25 mg total) by mouth every 12 (twelve) hours. 60 tablet 3  . iron polysaccharides (NIFEREX) 150 MG capsule Take 1 capsule (150 mg total) by mouth daily. 30 capsule 3  . isosorbide mononitrate (IMDUR) 30 MG 24 hr tablet TAKE 1 TABLET BY MOUTH EVERY DAY 30 tablet 6  . methimazole (TAPAZOLE) 5 MG tablet Take 1 tablet (5 mg total) by mouth daily. 30 tablet 3  . metoprolol tartrate (LOPRESSOR) 25 MG tablet Take 0.5 tablets (12.5 mg total) by mouth 2 (two) times daily. 60 tablet 3  . Multiple Vitamin (MULTIVITAMIN WITH MINERALS) TABS tablet Take 1 tablet by mouth daily.    . OXYGEN Inhale 3 L into the lungs at bedtime.     . potassium chloride SA (K-DUR,KLOR-CON) 20 MEQ tablet Take 20 mEq by mouth daily.    Marland Kitchen torsemide (DEMADEX) 20 MG tablet Take 2 tablets  by mouth in the morning and 1 tablet by mouth at night.    Marland Kitchen atorvastatin (LIPITOR) 20 MG tablet Take 20 mg by mouth at bedtime.    Marland Kitchen warfarin (COUMADIN) 2.5 MG tablet Take 1 tablet (2.5 mg total) by mouth daily at 6 PM. (Patient taking differently: Take 2.5-3.75 mg by mouth daily at 6 PM. 2.5 mg every day excpet on Tuesday its  3.75 mg) 30 tablet 2    sodium chloride, acetaminophen, albuterol, ondansetron (ZOFRAN) IV, sodium chloride flush  Results for orders placed or performed during the hospital encounter of 10/21/15 (from the past 48 hour(s))  I-Stat CG4 Lactic Acid, ED     Status: None   Collection Time: 10/21/15 10:53 AM  Result Value  Ref Range   Lactic Acid, Venous 1.61 0.5 - 2.0 mmol/L  CBC with Differential/Platelet     Status: Abnormal   Collection Time: 10/21/15 10:56 AM  Result Value Ref Range   WBC 8.0 4.0 - 10.5 K/uL   RBC 3.79 (L) 3.87 - 5.11 MIL/uL   Hemoglobin 9.1 (L) 12.0 - 15.0 g/dL   HCT 30.9 (L) 36.0 - 46.0 %   MCV 81.5 78.0 - 100.0 fL   MCH 24.0 (L) 26.0 - 34.0 pg   MCHC 29.4 (L) 30.0 - 36.0 g/dL   RDW 18.8 (H) 11.5 - 15.5 %   Platelets 236 150 - 400 K/uL   Neutrophils Relative % 86 %   Neutro Abs 6.8 1.7 - 7.7 K/uL   Lymphocytes Relative 7 %   Lymphs Abs 0.6 (L) 0.7 - 4.0 K/uL   Monocytes Relative 6 %   Monocytes Absolute 0.5 0.1 - 1.0 K/uL   Eosinophils Relative 1 %   Eosinophils Absolute 0.1 0.0 - 0.7 K/uL   Basophils Relative 0 %   Basophils Absolute 0.0 0.0 - 0.1 K/uL  Basic metabolic panel     Status: Abnormal   Collection Time: 10/21/15 10:56 AM  Result Value Ref Range   Sodium 139 135 - 145 mmol/L   Potassium 3.4 (L) 3.5 - 5.1 mmol/L   Chloride 105 101 - 111 mmol/L   CO2 22 22 - 32 mmol/L   Glucose, Bld 156 (H) 65 - 99 mg/dL   BUN 32 (H) 6 - 20 mg/dL   Creatinine, Ser 1.73 (H) 0.44 - 1.00 mg/dL   Calcium 9.3 8.9 - 10.3 mg/dL   GFR calc non Af Amer 26 (L) >60 mL/min   GFR calc Af Amer 31 (L) >60 mL/min    Comment: (NOTE) The eGFR has been  calculated using the CKD EPI equation. This calculation has not been validated in all clinical situations. eGFR's persistently <60 mL/min signify possible Chronic Kidney Disease.    Anion gap 12 5 - 15  Troponin I     Status: None   Collection Time: 10/21/15 10:56 AM  Result Value Ref Range   Troponin I <0.03 <0.031 ng/mL    Comment:        NO INDICATION OF MYOCARDIAL INJURY.   Brain natriuretic peptide     Status: Abnormal   Collection Time: 10/21/15 10:56 AM  Result Value Ref Range   B Natriuretic Peptide 1283.4 (H) 0.0 - 100.0 pg/mL  Protime-INR     Status: Abnormal   Collection Time: 10/21/15 10:56 AM  Result Value Ref Range   Prothrombin Time 16.3 (H) 11.6 - 15.2 seconds   INR 1.30 0.00 - 1.49  I-Stat CG4 Lactic Acid, ED     Status: None   Collection Time: 10/21/15  1:40 PM  Result Value Ref Range   Lactic Acid, Venous 1.11 0.5 - 2.0 mmol/L  Troponin I (q 6hr x 3)     Status: None   Collection Time: 10/21/15  3:03 PM  Result Value Ref Range   Troponin I 0.03 <0.031 ng/mL    Comment:        NO INDICATION OF MYOCARDIAL INJURY.   Glucose, capillary     Status: Abnormal   Collection Time: 10/21/15  4:14 PM  Result Value Ref Range   Glucose-Capillary 212 (H) 65 - 99 mg/dL   Comment 1 Notify RN    Dg Chest 2 View  10/21/2015  CLINICAL DATA:  Respiratory distress. EXAM: CHEST  2 VIEW COMPARISON:  Sep 26, 2015. FINDINGS: Stable cardiomegaly. No pneumothorax is noted. Minimal bilateral pleural effusions are noted posteriorly. Mild central pulmonary vascular congestion is noted with probable bilateral perihilar and basilar edema. Bony thorax is unremarkable. IMPRESSION: Cardiomegaly with mild bilateral pulmonary edema and minimal pleural effusions are noted consistent with congestive heart failure. Electronically Signed   By: Marijo Conception, M.D.   On: 10/21/2015 11:42   EKG: AF with RVR with subtle ST depressions  ROS: Positive for cough. Otherwise, review of systems is  negative unless per above HPI  Filed Vitals:   10/21/15 1400 10/21/15 1430 10/21/15 1500 10/21/15 1604  BP: 146/80 118/74 150/62 143/58  Pulse: 128 106 110 76  Temp:    99.1 F (37.3 C)  TempSrc:    Oral  Resp: '19 22 19 20  '$ Height:    '5\' 2"'$  (1.575 m)  Weight:    58.151 kg (128 lb 3.2 oz)  SpO2: 95% 95% 93% 96%   Wt Readings from Last 10 Encounters:  10/21/15 58.151 kg (128 lb 3.2 oz)  10/13/15 58.06 kg (128 lb)  09/29/15 59.058 kg (130 lb 3.2 oz)  09/22/15 59.467 kg (131 lb 1.6 oz)  08/18/15 61.644 kg (135 lb 14.4 oz)  08/04/15 62 kg (136 lb 11 oz)  07/03/15 62.4 kg (137 lb 9.1 oz)  06/29/15 63.504 kg (140 lb)  06/20/15 64.2 kg (141 lb 8.6 oz)  06/15/15 65.545 kg (144 lb 8 oz)    PE:  General: Elderly, somewhat frail, but in no acute distress HEENT: Atraumatic, EOMI, mucous membranes moist CV: iRiRR, 2/6 SEM but no gallops. JVD estimated at 10-15 cm Respiratory: bibasilar crackles and faint wheezing. Normal work of breathing ABD: Non-distended and non-tender. No palpable organomegaly.  Extremities: 2+ radial pulses bilaterally. No peripheral edema. Neuro/Psych: CN grossly intact, alert and oriented  Assessment/Plan 82 YO presenting with diastolic CHF exacerbation, AF with RVR, and possibly superimposed pulmonary infection.  # Acute on Chronic Diastolic HF- unclear nidus for flare- likely culprits would be AF, HTN (HTN on admission to ED).   - Continue with 40 IV lasix BID given JVD on exam, follow chemistries - Control HTN  # EKG changes: Inferolateral ST depression - Nuclear stress test 06/2013 low risk study. Suspect related to BP and RVR from AF.  She is pain free and troponin is negative - Trend troponin, daily ECG  # Atrial Fibrillation - Cardioverted at recent hospitalization 4/24, on Coumadin for CHADSVASC - 6, INR subtherapeutic on admit- 1.3 - Given sub therapeutic INR, no cardioversion at this time.  - Increase oral metoprolol from home dose gradually to  achieve rate control (Goal ~100). Agree with 25 mg BID, may need to increase to q 6 if she is having breakthrough RVR between doses - Would consider treating pulmonary infection given cough and sputum production - Continue coumadin  Lolita Cram Armandina Iman  MD 10/21/2015, 5:41 PM

## 2015-10-21 NOTE — H&P (Signed)
Family Medicine Teaching [facility] Admission History and Physical Service Pager: [phone]  Patient name: Alexis [name] Medical record number: [id number] Date of birth: 09-May-1932 Age: 82 y.o. Gender: female  Primary Care Provider: [name], PA-C Consultants: none Code Status: DNR confirmed on admission  Chief Complaint: Shortness of breath  Assessment and Plan: Alexis [name] is a 82 y.o. female presenting with Shortness of Breath  . PMH is significant for Chronic Hypoxic Respiratory Failure, COPD, Chronic Diastolic Heart Failure, Persistent Atrial Fibrillation, HTN, Anemia, Hyperthyroidism, CKD IV  # Acute on Chronic Diastolic HF- may be multifactorial. HTN on admission. Afib. Pulmonary congestion on CXR, O2 requirement is stable from home requirement. Here in atrial fibrillation with slightly increased rate. Concern with shortness of breath and new ST depressions noted in anterolateral leads. Troponin negative initially, Echo with moderate LVH, EF 55-60%. BNP is 1283. WBC 8, Hgb 9 (up from 7-8 recently) afebrile. VS otherwise stable, No wheezing to exam, only crackles, she is at her dry weight 128lb. Reports home medication compliance.  - Admit to tele Dr. McDiarmid Attending - IV lasix given in the ED. Continue for now.  - Continue O2 to keep sat > 92% - Treated as pneumonia in the ED with Levaquin. Hold for now  - Incentive spirometry - Rate control of Afib - HTN control.   # Inferolateral ST depression - New and seen on EKG in the ED. She is followed closely by Dr. [name]. Nuclear stress test 06/2013 low risk study. Echo 05/2015 with preserved EF and without segmental wall motion abnormalities. HEART score 5. Afib has been in the low 100's rate may be inducing.  - Trending troponins.  - Will consult with Cardiology.  - Control HR - TSH recently was normal.  - lipid panel.  # Atrial Fibrillation - now persistent, cardioverted at recent hospitalization 4/24, on  Coumadin with CHADSVASC - 6, INR subtherapeutic here at 1.3, Rate is normally controlled but is low 100's here. TSH is normal as of 08/2015. - On metoprolol 12.5 BID - Increase metop here to bring HR down further.  # COPD / Chronic Hypoxic Respiratory Failure -  No wheezes to exam. Takes albuterol only despite chronic O2 requirement. In review, it appears she has mostly needed O2 surrounding her CHF exacerbations.  - holding albuterol for now unless needed.  - Tx as exacerbation if not improving or if wheezes develop.   # Anemia - iron deficiency. Hbg 9. Recent baseline 7-8 - Monitor with cbc  # CKD IV - Cr 1.7, baseline is 2.4-2.5 - daily BMET with diuresis.   # DMII - it appears she has this, though it is not listed as a problem. She is not on any medications for this. A1C is 8.5.  - SSI here.   # Depression - continue lexapro.   # Hyperthyroidism - on Methimazole. TSH in 08/2015 is 1.38, T3, T4 essentially normal.  - Continue methimazole here.   # Peripheral Neuropathy - Gabapentin   FEN/GI: Heart healthy / carb modified.  Prophylaxis: anticoagulated.   Disposition: pending improvement.   History of Present Illness:  Alexis [name] is a 82 y.o. female presenting with shortness of breath starting last night. No chest pain, she says she felt palpitations in her chest. She did have some nausea earlier today, but otherwise no neck pain, arm pain or jaw pain. Cough productive of yellow phlegm since last night. No fever, no chills, no headache. She says she did have a  sick contact with her great grandson who had a viral infection. Has not been gaining weight / swelling. Has been taking all of her medicine at home including her fluid pills. She normally sleeps in a recliner ever since she broke her hip 13 years ago. She sleeps sitting up in the recliner. Not dyspneic with sleeping. She is normally on Oxygen at home 3L not ALL of the time, but at night and sometimes during the day. She  never smoked. Her husband smokes every day since they've been married. Only on albuterol for COPD   Review Of Systems: Per HPI with the following additions: none Otherwise the remainder of the systems were negative.  Patient Active Problem List   Diagnosis Date Noted  . Type II diabetes mellitus (HCC)   . Hypertensive heart disease   . Atrial fibrillation, persistent (HCC)   . Absolute anemia   . Diabetes mellitus with complication (HCC)   . Simple chronic bronchitis (HCC)   . Shortness of breath 09/18/2015  . CHF exacerbation (HCC) 09/18/2015  . Anemia 09/18/2015  . Diabetes (HCC) 09/18/2015  . Hypertensive urgency   . CHF (congestive heart failure) (HCC) 08/02/2015  . Acute respiratory failure (HCC) 08/02/2015  . Acute diastolic CHF (congestive heart failure) (HCC) 08/02/2015  . Acute on chronic diastolic congestive heart failure, NYHA class 1 (HCC) 07/01/2015  . Chronic respiratory failure with hypoxia (HCC) 06/30/2015  . Hypoglycemia associated with type 2 diabetes mellitus (HCC) 06/30/2015  . Closed displaced fracture of left great toe 06/30/2015  . CKD (chronic kidney disease), stage IV (HCC) 06/30/2015  . UTI (lower urinary tract infection) 06/30/2015  . Hypoglycemia associated with diabetes (HCC) 06/30/2015  . Hyperthyroidism 01/23/2015  . Long-term (current) use of anticoagulants 12/12/2014  . Paroxysmal atrial fibrillation (HCC) 12/01/2014  . Melena   . Pressure ulcer 11/30/2014  . Normocytic anemia 11/29/2014  . COPD (chronic obstructive pulmonary disease) (HCC) 07/12/2014  . (HFpEF) heart failure with preserved ejection fraction (HCC) 07/16/2013  . Hypertension 06/10/2013  . Chronic diastolic heart failure, NYHA class 1 (HCC) 06/09/2013    Past Medical History: Past Medical History  Diagnosis Date  . Hypertensive heart disease   . Neuropathy (HCC)   . Anxiety   . PONV (postoperative nausea and vomiting)   . Chronic diastolic CHF (congestive heart failure)  (HCC)     a. 05/2015 Echo: EF 55-60%, mild LVH, no rwma, mild MR, mildly dil RA, mod TR, PASP .  Marland Kitchen COPD (chronic obstructive pulmonary disease) (HCC)     a. on home O2 @ 3lpm.  . Family history of adverse reaction to anesthesia   . Type II diabetes mellitus (HCC)   . Macular degeneration, bilateral   . Varicose veins   . GIB (gastrointestinal bleeding)     a. History of GIB.  Marland Kitchen Paroxysmal atrial fibrillation (HCC)     a. CHA2DS2VASc = 6-->coumadin.  . Confusion state   . CKD (chronic kidney disease), stage IV (HCC)   . Elevated troponin     a. H/o elevated trop in setting of CHF; b. 06/2013 Lexiscan MV: nl EF, mild anterior breast attenuation, no ischemia.     Past Surgical History: Past Surgical History  Procedure Laterality Date  . Hip fracture surgery  2004  . Abdominal hysterectomy    . Vein surgery    . Esophagogastroduodenoscopy N/A 12/01/2014    Procedure: ESOPHAGOGASTRODUODENOSCOPY (EGD);  Surgeon: Hilarie Fredrickson, MD;  Location: [facility] ENDOSCOPY;  Service: Endoscopy;  Laterality: N/A;  .  Cardioversion N/A 09/22/2015    Procedure: CARDIOVERSION;  Surgeon: Chrystie Nose, MD;  Location: [facility] ENDOSCOPY;  Service: Cardiovascular;  Laterality: N/A;    Social History: Social History  Substance Use Topics  . Smoking status: Passive Smoke Exposure - Never Smoker  . Smokeless tobacco: Never Used  . Alcohol Use: No   Additional social history: none  Please also refer to relevant sections of EMR.  Family History: Family History  Problem Relation Age of Onset  . Colon cancer Father 81    advanced when discovered, no surgery or therapy.  this led to his death  . Breast cancer Sister   . Cancer Brother   . Breast cancer Mother   . Lung cancer Brother   . Thyroid disease Neg Hx   . Heart attack Neg Hx   . Hypertension Mother    (If not completed, MUST add something in)  Allergies and Medications: Allergies  Allergen Reactions  . Yellow Dyes (Non-Tartrazine) Nausea And  Vomiting    "deathly allergic" No other information given to explain reaction. Per Daughter- this was an IV dye. Ok with yellow coloring on Coumadin tablets.   . Codeine Hives  . Other Other (See Comments)    novocaine broke mouth out  . Penicillins Hives  . Procaine Hives   No current facility-administered medications on file prior to encounter.   Current Outpatient Prescriptions on File Prior to Encounter  Medication Sig Dispense Refill  . albuterol (PROVENTIL HFA;VENTOLIN HFA) 108 (90 BASE) MCG/ACT inhaler Inhale 2 puffs into the lungs every 6 (six) hours as needed for wheezing or shortness of breath. 1 Inhaler 3  . cholecalciferol (VITAMIN D) 1000 UNITS tablet Take 1,000 Units by mouth every morning.     . escitalopram (LEXAPRO) 10 MG tablet Take 10 mg by mouth daily.    . fenofibrate 160 MG tablet Take 160 mg by mouth daily.    Marland Kitchen gabapentin (NEURONTIN) 100 MG capsule Take 1 capsule (100 mg total) by mouth at bedtime. 30 capsule 0  . hydrALAZINE (APRESOLINE) 25 MG tablet Take 1 tablet (25 mg total) by mouth every 12 (twelve) hours. 60 tablet 3  . iron polysaccharides (NIFEREX) 150 MG capsule Take 1 capsule (150 mg total) by mouth daily. 30 capsule 3  . isosorbide mononitrate (IMDUR) 30 MG 24 hr tablet TAKE 1 TABLET BY MOUTH EVERY DAY 30 tablet 6  . methimazole (TAPAZOLE) 5 MG tablet Take 1 tablet (5 mg total) by mouth daily. 30 tablet 3  . metoprolol tartrate (LOPRESSOR) 25 MG tablet Take 0.5 tablets (12.5 mg total) by mouth 2 (two) times daily. 60 tablet 3  . Multiple Vitamin (MULTIVITAMIN WITH MINERALS) TABS tablet Take 1 tablet by mouth daily.    . OXYGEN Inhale 3 L into the lungs at bedtime.     . potassium chloride SA (K-DUR,KLOR-CON) 20 MEQ tablet Take 20 mEq by mouth daily.    Marland Kitchen torsemide (DEMADEX) 20 MG tablet Take 2 tablets by mouth in the morning and 1 tablet by mouth at night.    Marland Kitchen atorvastatin (LIPITOR) 20 MG tablet Take 20 mg by mouth at bedtime.    Marland Kitchen warfarin (COUMADIN)  2.5 MG tablet Take 1 tablet (2.5 mg total) by mouth daily at 6 PM. (Patient taking differently: Take 2.5-3.75 mg by mouth daily at 6 PM. 2.5 mg every day excpet on Tuesday its  3.75 mg) 30 tablet 2    Objective: BP 143/73 mmHg  Pulse 109  Temp(Src) 99.6  F (37.6 C) (Rectal)  Resp 10  Ht 5\' 2"  (1.575 m)  Wt 128 lb (58.06 kg)  BMI 23.41 kg/m2  SpO2 93% Exam: General: NAD, resting omfortably Eyes: EOMI, PERRLA ENTM: Nares patent, o/p clear and without erythema.  Neck: FROM, supple.  Cardiovascular: IRRR Respiratory: Crackles diffusely, appropriate rate, unlabored.  Abdomen: S, NT, ND, +BS.  MSK: WWP, no edema, 2+ distal pulses.  Skin: no rashes, no lesiosn. Sacral stage 1 ulcer.  Neuro: grossly in tact.  Psych: appropriate mood / affect.   Labs and Imaging: CBC BMET   Recent Labs Lab 10/21/15 1056  WBC 8.0  HGB 9.1*  HCT 30.9*  PLT 236    Recent Labs Lab 10/21/15 1056  NA 139  K 3.4*  CL 105  CO2 22  BUN 32*  CREATININE 1.73*  GLUCOSE 156*  CALCIUM 9.3     EKG - st depression anterolateral leads.   BNP - 1200    Yolande Jolly, MD 10/21/2015, 3:08 PM PGY-2, [facility] Family Medicine FPTS Intern pager: [phone], text pages welcome

## 2015-10-21 NOTE — Progress Notes (Signed)
Trop very slightly up to 0.05. Pt. Without chest pain per nursing at this time. HR is now rate controlled down to 80's on tele with increased metoprolol. Expect slight bump is due to afib with HR slightly up earlier. Additionally CKD IV. Will continue to trend troponins. Will call cards if continues to rise.   Devota Pace, MD Family Medicine -PGY 2

## 2015-10-21 NOTE — ED Notes (Signed)
Phlebotomy at bedside.

## 2015-10-22 DIAGNOSIS — R9431 Abnormal electrocardiogram [ECG] [EKG]: Secondary | ICD-10-CM

## 2015-10-22 DIAGNOSIS — I480 Paroxysmal atrial fibrillation: Secondary | ICD-10-CM

## 2015-10-22 DIAGNOSIS — I5033 Acute on chronic diastolic (congestive) heart failure: Secondary | ICD-10-CM

## 2015-10-22 DIAGNOSIS — J441 Chronic obstructive pulmonary disease with (acute) exacerbation: Secondary | ICD-10-CM | POA: Insufficient documentation

## 2015-10-22 DIAGNOSIS — R7989 Other specified abnormal findings of blood chemistry: Secondary | ICD-10-CM

## 2015-10-22 LAB — HEPARIN LEVEL (UNFRACTIONATED)
HEPARIN UNFRACTIONATED: 0.45 [IU]/mL (ref 0.30–0.70)
Heparin Unfractionated: 0.24 IU/mL — ABNORMAL LOW (ref 0.30–0.70)
Heparin Unfractionated: 0.45 IU/mL (ref 0.30–0.70)

## 2015-10-22 LAB — BASIC METABOLIC PANEL
ANION GAP: 8 (ref 5–15)
BUN: 33 mg/dL — ABNORMAL HIGH (ref 6–20)
CALCIUM: 9.1 mg/dL (ref 8.9–10.3)
CO2: 27 mmol/L (ref 22–32)
CREATININE: 1.78 mg/dL — AB (ref 0.44–1.00)
Chloride: 104 mmol/L (ref 101–111)
GFR, EST AFRICAN AMERICAN: 30 mL/min — AB (ref >60)
GFR, EST NON AFRICAN AMERICAN: 26 mL/min — AB (ref >60)
Glucose, Bld: 237 mg/dL — ABNORMAL HIGH (ref 65–99)
Potassium: 4.5 mmol/L (ref 3.5–5.1)
SODIUM: 139 mmol/L (ref 135–145)

## 2015-10-22 LAB — PROTIME-INR
INR: 1.77 — ABNORMAL HIGH (ref 0.00–1.49)
PROTHROMBIN TIME: 20.6 s — AB (ref 11.6–15.2)

## 2015-10-22 LAB — LIPID PANEL
CHOL/HDL RATIO: 2.8 ratio
CHOLESTEROL: 132 mg/dL (ref 0–200)
HDL: 48 mg/dL (ref >40)
LDL Cholesterol: 72 mg/dL (ref 0–99)
Triglycerides: 62 mg/dL (ref <150)
VLDL: 12 mg/dL (ref 0–40)

## 2015-10-22 LAB — GLUCOSE, CAPILLARY
GLUCOSE-CAPILLARY: 161 mg/dL — AB (ref 65–99)
GLUCOSE-CAPILLARY: 118 mg/dL — AB (ref 65–99)
Glucose-Capillary: 135 mg/dL — ABNORMAL HIGH (ref 65–99)
Glucose-Capillary: 128 mg/dL — ABNORMAL HIGH (ref 65–99)

## 2015-10-22 LAB — TROPONIN I
TROPONIN I: 0.05 ng/mL — AB (ref <0.031)
TROPONIN I: 0.05 ng/mL — AB (ref <0.031)
Troponin I: 0.05 ng/mL — ABNORMAL HIGH (ref <0.031)
Troponin I: 0.05 ng/mL — ABNORMAL HIGH (ref <0.031)

## 2015-10-22 LAB — CBC
HEMATOCRIT: 27 % — AB (ref 36.0–46.0)
Hemoglobin: 8.1 g/dL — ABNORMAL LOW (ref 12.0–15.0)
MCH: 24.1 pg — AB (ref 26.0–34.0)
MCHC: 30 g/dL (ref 30.0–36.0)
MCV: 80.4 fL (ref 78.0–100.0)
Platelets: 230 10*3/uL (ref 150–400)
RBC: 3.36 MIL/uL — ABNORMAL LOW (ref 3.87–5.11)
RDW: 18.7 % — AB (ref 11.5–15.5)
WBC: 9.7 10*3/uL (ref 4.0–10.5)

## 2015-10-22 MED ORDER — PREDNISONE 20 MG PO TABS: 40.0000 mg | ORAL_TABLET | Freq: Every day | ORAL | Status: DC

## 2015-10-22 MED ORDER — ALBUTEROL SULFATE (2.5 MG/3ML) 0.083% IN NEBU
3.0000 mL | INHALATION_SOLUTION | RESPIRATORY_TRACT | Status: DC | PRN
  Administered 2015-10-22: 3 mL via RESPIRATORY_TRACT
  Filled 2015-10-22: qty 3

## 2015-10-22 MED ORDER — INSULIN ASPART 100 UNIT/ML ~~LOC~~ SOLN
0.0000 [IU] | Freq: Three times a day (TID) | SUBCUTANEOUS | Status: DC
  Administered 2015-10-22: 1 [IU] via SUBCUTANEOUS
  Administered 2015-10-23: 3 [IU] via SUBCUTANEOUS

## 2015-10-22 MED ORDER — WARFARIN SODIUM 5 MG PO TABS
5.0000 mg | ORAL_TABLET | Freq: Once | ORAL | Status: AC
  Administered 2015-10-22: 5 mg via ORAL
  Filled 2015-10-22: qty 1

## 2015-10-22 MED ORDER — FUROSEMIDE 10 MG/ML IJ SOLN
40.0000 mg | Freq: Two times a day (BID) | INTRAMUSCULAR | Status: DC
  Administered 2015-10-22: 40 mg via INTRAVENOUS
  Filled 2015-10-22: qty 4

## 2015-10-22 MED ORDER — LEVOFLOXACIN 500 MG PO TABS
500.0000 mg | ORAL_TABLET | ORAL | Status: DC
  Administered 2015-10-23: 500 mg via ORAL
  Filled 2015-10-22: qty 1

## 2015-10-22 NOTE — Progress Notes (Signed)
ANTICOAGULATION CONSULT NOTE  Pharmacy Consult for Heparin + Coumadin  Indication: atrial fibrillation  Allergies  Allergen Reactions  . Yellow Dyes (Non-Tartrazine) Nausea And Vomiting    "deathly allergic" No other information given to explain reaction. Per Daughter- this was an IV dye. Ok with yellow coloring on Coumadin tablets.   . Codeine Hives  . Other Other (See Comments)    novocaine broke mouth out  . Penicillins Hives  . Procaine Hives    Patient Measurements: Height: 5\' 2"  (157.5 cm) Weight: 125 lb (56.7 kg) IBW/kg (Calculated) : 50.1 Heparin Dosing Weight: 58 kg   Vital Signs: Temp: 97.5 F (36.4 C) (05/28 2023) Temp Source: Oral (05/28 2023) BP: 150/44 mmHg (05/28 2023) Pulse Rate: 59 (05/28 2023)  Labs:  Recent Labs  10/20/15 1026 10/21/15 1056  10/21/15 1628  10/22/15 0301 10/22/15 0629 10/22/15 0825 10/22/15 1226 10/22/15 1408 10/22/15 2005  HGB  --  9.1*  --   --   --  8.1*  --   --   --   --   --   HCT  --  30.9*  --   --   --  27.0*  --   --   --   --   --   PLT  --  236  --   --   --  230  --   --   --   --   --   LABPROT  --  16.3*  --   --   --   --  20.6*  --   --   --   --   INR 1.3 1.30  --   --   --   --  1.77*  --   --   --   --   HEPARINUNFRC  --   --   --   --   --  0.24*  --   --  0.45  --  0.45  CREATININE  --  1.73*  --  1.76*  --  1.78*  --   --   --   --   --   TROPONINI  --  <0.03  < >  --   < > 0.05*  --  0.05*  --  0.05*  --   < > = values in this interval not displayed.  Estimated Creatinine Clearance: 19.6 mL/min (by C-G formula based on Cr of 1.78).   Medical History: Past Medical History  Diagnosis Date  . Hypertensive heart disease   . Neuropathy (HCC)   . Anxiety   . PONV (postoperative nausea and vomiting)   . Chronic diastolic CHF (congestive heart failure) (HCC)     a. 05/2015 Echo: EF 55-60%, mild LVH, no rwma, mild MR, mildly dil RA, mod TR, PASP .  Marland Kitchen COPD (chronic obstructive pulmonary disease)  (HCC)     a. on home O2 @ 3lpm.  . Family history of adverse reaction to anesthesia   . Type II diabetes mellitus (HCC)   . Macular degeneration, bilateral   . Varicose veins   . GIB (gastrointestinal bleeding)     a. History of GIB.  Marland Kitchen Paroxysmal atrial fibrillation (HCC)     a. CHA2DS2VASc = 6-->coumadin.  . Confusion state   . CKD (chronic kidney disease), stage IV (HCC)   . Elevated troponin     a. H/o elevated trop in setting of CHF; b. 06/2013 Lexiscan MV: nl EF, mild anterior breast attenuation, no ischemia.  Medications:  Prescriptions prior to admission  Medication Sig Dispense Refill Last Dose  . albuterol (PROVENTIL HFA;VENTOLIN HFA) 108 (90 BASE) MCG/ACT inhaler Inhale 2 puffs into the lungs every 6 (six) hours as needed for wheezing or shortness of breath. 1 Inhaler 3 10/21/2015 at Unknown time  . cholecalciferol (VITAMIN D) 1000 UNITS tablet Take 1,000 Units by mouth every morning.    10/20/2015 at Unknown time  . escitalopram (LEXAPRO) 10 MG tablet Take 10 mg by mouth daily.   10/20/2015 at Unknown time  . fenofibrate 160 MG tablet Take 160 mg by mouth daily.   10/20/2015 at Unknown time  . gabapentin (NEURONTIN) 100 MG capsule Take 1 capsule (100 mg total) by mouth at bedtime. 30 capsule 0 10/20/2015 at Unknown time  . hydrALAZINE (APRESOLINE) 25 MG tablet Take 1 tablet (25 mg total) by mouth every 12 (twelve) hours. 60 tablet 3 10/20/2015 at 7a  . iron polysaccharides (NIFEREX) 150 MG capsule Take 1 capsule (150 mg total) by mouth daily. 30 capsule 3 10/20/2015 at Unknown time  . isosorbide mononitrate (IMDUR) 30 MG 24 hr tablet TAKE 1 TABLET BY MOUTH EVERY DAY 30 tablet 6 10/20/2015 at Unknown time  . methimazole (TAPAZOLE) 5 MG tablet Take 1 tablet (5 mg total) by mouth daily. 30 tablet 3 10/20/2015 at Unknown time  . metoprolol tartrate (LOPRESSOR) 25 MG tablet Take 0.5 tablets (12.5 mg total) by mouth 2 (two) times daily. 60 tablet 3 10/20/2015 at 8a  . Multiple Vitamin  (MULTIVITAMIN WITH MINERALS) TABS tablet Take 1 tablet by mouth daily.   10/20/2015 at Unknown time  . OXYGEN Inhale 3 L into the lungs at bedtime.    Past Week at Unknown time  . potassium chloride SA (K-DUR,KLOR-CON) 20 MEQ tablet Take 20 mEq by mouth daily.   10/20/2015 at Unknown time  . torsemide (DEMADEX) 20 MG tablet Take 2 tablets by mouth in the morning and 1 tablet by mouth at night.   10/20/2015 at Unknown time  . atorvastatin (LIPITOR) 20 MG tablet Take 20 mg by mouth at bedtime.   Taking  . warfarin (COUMADIN) 2.5 MG tablet Take 1 tablet (2.5 mg total) by mouth daily at 6 PM. (Patient taking differently: Take 2.5-3.75 mg by mouth daily at 6 PM. 2.5 mg every day excpet on Tuesday its  3.75 mg) 30 tablet 2 Taking    Assessment: 69 YOF with h/o Afib on Coumadin prior to admission. INR is sub-therapeutic on admission. Pharmacy consulted to resume Coumadin and bridge with heparin. INR on admission is sub-therapeutic at 1.3.   Initial HL 0.45 (therapeutic). Confirmatory HL 0.45 also remains in goal range.  Goal of Therapy:  INR 2-3 Heparin level 0.3-0.7 units/ml Monitor platelets by anticoagulation protocol: Yes   Plan:  Continue heparin at 950 units/hr Monitor daily HL, INR, CBC and s/s of bleeding    Jarryd Gratz S. Merilynn Finland, PharmD, BCPS Clinical Staff Pharmacist Pager [phone]

## 2015-10-22 NOTE — Progress Notes (Signed)
SUBJECTIVE: Feels better than yesterday. Denies chest pain. Thinks her COPD has flared.   ROS: Other than pertinent positives in "Subjective", all others were reviewed and found to be negative.   Intake/Output Summary (Last 24 hours) at 10/22/15 0806 Last data filed at 10/22/15 0600  Gross per 24 hour  Intake 217.35 ml  Output    850 ml  Net -632.65 ml    Current Facility-Administered Medications  Medication Dose Route Frequency Provider Last Rate Last Dose  . 0.9 %  sodium chloride infusion  250 mL Intravenous PRN Yolande Jolly, MD      . acetaminophen (TYLENOL) tablet 650 mg  650 mg Oral Q4H PRN Yolande Jolly, MD      . atorvastatin (LIPITOR) tablet 20 mg  20 mg Oral QHS Yolande Jolly, MD   20 mg at 10/21/15 2140  . escitalopram (LEXAPRO) tablet 10 mg  10 mg Oral Daily Yolande Jolly, MD   10 mg at 10/21/15 1839  . fenofibrate tablet 160 mg  160 mg Oral Daily Yolande Jolly, MD   160 mg at 10/21/15 2130  . furosemide (LASIX) injection 60 mg  60 mg Intravenous BID Yolande Jolly, MD      . gabapentin (NEURONTIN) capsule 100 mg  100 mg Oral QHS Yolande Jolly, MD   100 mg at 10/21/15 2159  . heparin ADULT infusion 100 units/mL (25000 units/259mL sodium chloride 0.45%)  950 Units/hr Intravenous Continuous Marquita Palms, RPH 9.5 mL/hr at 10/22/15 0408 950 Units/hr at 10/22/15 0408  . hydrALAZINE (APRESOLINE) tablet 25 mg  25 mg Oral Q12H Yolande Jolly, MD   25 mg at 10/21/15 2140  . iron polysaccharides (NIFEREX) capsule 150 mg  150 mg Oral Daily Yolande Jolly, MD      . isosorbide mononitrate (IMDUR) 24 hr tablet 30 mg  30 mg Oral Daily Yolande Jolly, MD   30 mg at 10/21/15 1839  . methimazole (TAPAZOLE) tablet 5 mg  5 mg Oral Daily Yolande Jolly, MD   5 mg at 10/21/15 1615  . metoprolol tartrate (LOPRESSOR) tablet 25 mg  25 mg Oral BID Yolande Jolly, MD   25 mg at 10/21/15 2140  . ondansetron (ZOFRAN) injection 4 mg  4 mg Intravenous Q6H PRN  Caleb G Melancon, MD      . sodium chloride flush (NS) 0.9 % injection 3 mL  3 mL Intravenous Q12H Yolande Jolly, MD   3 mL at 10/21/15 2141  . sodium chloride flush (NS) 0.9 % injection 3 mL  3 mL Intravenous PRN Yolande Jolly, MD      . Warfarin - Pharmacist Dosing Inpatient   Does not apply q1800 Candis Schatz Mancheril, RPH        Filed Vitals:   10/21/15 1604 10/21/15 2113 10/22/15 0059 10/22/15 0622  BP: 143/58 140/53 142/56 135/53  Pulse: 76 76 84   Temp: 99.1 F (37.3 C) 98.3 F (36.8 C) 98.2 F (36.8 C) 97.6 F (36.4 C)  TempSrc: Oral Oral Oral Oral  Resp: 20 18 17 17   Height: 5\' 2"  (1.575 m)     Weight: 128 lb 3.2 oz (58.151 kg)   125 lb (56.7 kg)  SpO2: 96% 97% 96% 98%    PHYSICAL EXAM General: NAD HEENT: Normal. Neck: No JVD, no thyromegaly.  Lungs: Pronounced expiratory wheezes b/l, no rales CV: Nondisplaced PMI.  Regular rate and rhythm, normal  S1/S2, no S3/S4, no murmur.  No pretibial edema.    Abdomen: Soft, nontender, no distention.  Neurologic: Alert and oriented x 3.  Psych: Normal affect. Musculoskeletal: No gross deformities. Extremities: No clubbing or cyanosis.     LABS: Basic Metabolic Panel:  Recent Labs  83/09/40 1628 10/22/15 0301  NA 140 139  K 3.8 4.5  CL 104 104  CO2 26 27  GLUCOSE 222* 237*  BUN 32* 33*  CREATININE 1.76* 1.78*  CALCIUM 9.4 9.1  MG 2.0  --    Liver Function Tests:  Recent Labs  10/21/15 1628  AST 26  ALT 19  ALKPHOS 73  BILITOT 0.8  PROT 6.9  ALBUMIN 3.1*   No results for input(s): LIPASE, AMYLASE in the last 72 hours. CBC:  Recent Labs  10/21/15 1056 10/22/15 0301  WBC 8.0 9.7  NEUTROABS 6.8  --   HGB 9.1* 8.1*  HCT 30.9* 27.0*  MCV 81.5 80.4  PLT 236 230   Cardiac Enzymes:  Recent Labs  10/21/15 1503 10/21/15 2120 10/22/15 0301  TROPONINI 0.03 0.05* 0.05*   BNP: Invalid input(s): POCBNP D-Dimer: No results for input(s): DDIMER in the last 72 hours. Hemoglobin A1C: No  results for input(s): HGBA1C in the last 72 hours. Fasting Lipid Panel: No results for input(s): CHOL, HDL, LDLCALC, TRIG, CHOLHDL, LDLDIRECT in the last 72 hours. Thyroid Function Tests:  Recent Labs  10/21/15 1628  TSH 0.970   Anemia Panel: No results for input(s): VITAMINB12, FOLATE, FERRITIN, TIBC, IRON, RETICCTPCT in the last 72 hours.  RADIOLOGY: Dg Chest 2 View  10/21/2015  CLINICAL DATA:  Respiratory distress. EXAM: CHEST  2 VIEW COMPARISON:  Sep 26, 2015. FINDINGS: Stable cardiomegaly. No pneumothorax is noted. Minimal bilateral pleural effusions are noted posteriorly. Mild central pulmonary vascular congestion is noted with probable bilateral perihilar and basilar edema. Bony thorax is unremarkable. IMPRESSION: Cardiomegaly with mild bilateral pulmonary edema and minimal pleural effusions are noted consistent with congestive heart failure. Electronically Signed   By: Lupita Raider, M.D.   On: 10/21/2015 11:42   Dg Chest 2 View  09/26/2015  CLINICAL DATA:  Larey Seat tonight, striking head and left chest. Patient is on Coumadin. EXAM: CHEST  2 VIEW COMPARISON:  09/19/2015 FINDINGS: Diffuse enlargement of the cardiac silhouette with mild pulmonary vascular congestion and interstitial edema. Small bilateral pleural effusions with basilar atelectasis. No pneumothorax. Old appearing left rib fractures. IMPRESSION: Cardiac enlargement with mild pulmonary vascular congestion interstitial edema. Small bilateral pleural effusions. Electronically Signed   By: Burman Nieves M.D.   On: 09/26/2015 04:54   Ct Head Wo Contrast  09/26/2015  CLINICAL DATA:  Moderate left occipital headache after a fall. Struck head on a closet door. No loss of consciousness. On Coumadin. EXAM: CT HEAD WITHOUT CONTRAST CT CERVICAL SPINE WITHOUT CONTRAST TECHNIQUE: Multidetector CT imaging of the head and cervical spine was performed following the standard protocol without intravenous contrast. Multiplanar CT image  reconstructions of the cervical spine were also generated. COMPARISON:  CT head 04/23/2009.  MRI brain 04/12/2009. FINDINGS: CT HEAD FINDINGS Diffuse cerebral atrophy. Mild ventricular dilatation consistent with central atrophy. Low-attenuation changes throughout the deep white matter consistent with small vessel ischemia. No mass effect or midline shift. No abnormal extra-axial fluid collections. Gray-white matter junctions are distinct. Basal cisterns are not effaced. No evidence of acute intracranial hemorrhage. No depressed skull fractures. Visualized paranasal sinuses and mastoid air cells are not opacified. CT CERVICAL SPINE FINDINGS Normal alignment of the cervical  spine. Diffuse degenerative change with narrowed cervical interspaces and endplate hypertrophic changes throughout. Mild degenerative changes in the cervical facet joints. No vertebral compression deformities. No prevertebral soft tissue swelling. No focal bone lesion or bone destruction. C1-2 articulation appears intact. Soft tissues are unremarkable. Diffuse nodular enlargement of the thyroid gland. Calcifications in the right thyroid. Right pleural effusion. Interstitial changes in the lung apices may indicate edema. IMPRESSION: No acute intracranial abnormalities. Chronic atrophy and small vessel ischemic changes. Normal alignment of the cervical spine. Diffuse degenerative change. No acute displaced fractures identified. Incidental note of right pleural effusion and possible edema in the lungs. Heterogeneous nodular enlargement of the thyroid gland with calcification. Consider follow-up with ultrasound in the elective setting. Electronically Signed   By: Burman Nieves M.D.   On: 09/26/2015 04:50   Ct Cervical Spine Wo Contrast  09/26/2015  CLINICAL DATA:  Moderate left occipital headache after a fall. Struck head on a closet door. No loss of consciousness. On Coumadin. EXAM: CT HEAD WITHOUT CONTRAST CT CERVICAL SPINE WITHOUT CONTRAST  TECHNIQUE: Multidetector CT imaging of the head and cervical spine was performed following the standard protocol without intravenous contrast. Multiplanar CT image reconstructions of the cervical spine were also generated. COMPARISON:  CT head 04/23/2009.  MRI brain 04/12/2009. FINDINGS: CT HEAD FINDINGS Diffuse cerebral atrophy. Mild ventricular dilatation consistent with central atrophy. Low-attenuation changes throughout the deep white matter consistent with small vessel ischemia. No mass effect or midline shift. No abnormal extra-axial fluid collections. Gray-white matter junctions are distinct. Basal cisterns are not effaced. No evidence of acute intracranial hemorrhage. No depressed skull fractures. Visualized paranasal sinuses and mastoid air cells are not opacified. CT CERVICAL SPINE FINDINGS Normal alignment of the cervical spine. Diffuse degenerative change with narrowed cervical interspaces and endplate hypertrophic changes throughout. Mild degenerative changes in the cervical facet joints. No vertebral compression deformities. No prevertebral soft tissue swelling. No focal bone lesion or bone destruction. C1-2 articulation appears intact. Soft tissues are unremarkable. Diffuse nodular enlargement of the thyroid gland. Calcifications in the right thyroid. Right pleural effusion. Interstitial changes in the lung apices may indicate edema. IMPRESSION: No acute intracranial abnormalities. Chronic atrophy and small vessel ischemic changes. Normal alignment of the cervical spine. Diffuse degenerative change. No acute displaced fractures identified. Incidental note of right pleural effusion and possible edema in the lungs. Heterogeneous nodular enlargement of the thyroid gland with calcification. Consider follow-up with ultrasound in the elective setting. Electronically Signed   By: Burman Nieves M.D.   On: 09/26/2015 04:50      ASSESSMENT AND PLAN: 82 YO presenting with diastolic CHF exacerbation, AF  with RVR, and possibly superimposed pulmonary infection, likely COPD exacerbation.  1.  Acute on Chronic Diastolic HF- unclear nidus for flare- likely culprits would be AF, HTN (HTN on admission to ED).  - Continue with 40 IV lasix BID for now and follow chemistries - Control HTN (BP normal this morning)  2. EKG changes: Inferolateral ST depression - Nuclear stress test 06/2013 low risk study. Suspect related to BP and RVR from AF. She is pain free and troponin is negative - Trend troponin, daily ECG  3. Atrial Fibrillation - Cardioverted at recent hospitalization 4/24, on Coumadin for CHADSVASC - 6, INR subtherapeutic on admit- 1.3, 1.77 today - continue metoprolol 25 mg BID, may need to increase to q 6 if she is having breakthrough RVR between doses - Would consider treating pulmonary infection given cough and sputum production (I think she needs steroids  at a low dose as she has marked wheezing) - Continue coumadin  4. Acute respiratory infection/COPD flare: I think she needs steroids at a low dose as she has marked wheezing.    Prentice Docker, M.D., F.A.C.C.

## 2015-10-22 NOTE — Discharge Instructions (Addendum)
You were hospitalized because you were having shortness of breath. We think your shortness of breath was caused by a worsening of your COPD. We were worried that your heart was not getting enough blood flow, but your heart started getting plenty of blood once your heart rate came down. We have made the following changes to your medications:  1. For your COPD exacerbation, please take Prednisone 40mg  for 3 days starting tomorrow (5/30). Please take Levaquin 750mg  on 5/30 and 6/1.  2. For your heart failure, please take Torsemide 40mg  daily. We would like for you to be seen by your primary care provider within the next week so she can adjust this dose as needed. 3. Your heart rate was high when you first came to the hospital. We increased your Metoprolol from 12.5mg  twice a day to 25mg  twice a day. 4. For your Coumadin, please take 3.75mg  tonight and then daily. We would like for you to be seen at the Cardiologist's office for an anti-coagulation visit on 5/31 or 6/1.  We have ordered Home Health Physical Therapy to come out to your home to help you become stronger. They should come out to your house sometime this week.    Information on my medicine - Coumadin   (Warfarin)  This medication education was reviewed with me or my healthcare representative as part of my discharge preparation.  The pharmacist that spoke with me during my hospital stay was:  Remi Haggard, [facility]  Why was Coumadin prescribed for you? Coumadin was prescribed for you because you have a blood clot or a medical condition that can cause an increased risk of forming blood clots. Blood clots can cause serious health problems by blocking the flow of blood to the heart, lung, or brain. Coumadin can prevent harmful blood clots from forming. As a reminder your indication for Coumadin is:   Select from menu  What test will check on my response to Coumadin? While on Coumadin (warfarin) you will need to have an INR test regularly to  ensure that your dose is keeping you in the desired range. The INR (international normalized ratio) number is calculated from the result of the laboratory test called prothrombin time (PT).  If an INR APPOINTMENT HAS NOT ALREADY BEEN MADE FOR YOU please schedule an appointment to have this lab work done by your health care provider within 7 days. Your INR goal is usually a number between:  2 to 3 or your provider may give you a more narrow range like 2-2.5.  Ask your health care provider during an office visit what your goal INR is.  What  do you need to  know  About  COUMADIN? Take Coumadin (warfarin) exactly as prescribed by your healthcare provider about the same time each day.  DO NOT stop taking without talking to the doctor who prescribed the medication.  Stopping without other blood clot prevention medication to take the place of Coumadin may increase your risk of developing a new clot or stroke.  Get refills before you run out.  What do you do if you miss a dose? If you miss a dose, take it as soon as you remember on the same day then continue your regularly scheduled regimen the next day.  Do not take two doses of Coumadin at the same time.  Important Safety Information A possible side effect of Coumadin (Warfarin) is an increased risk of bleeding. You should call your healthcare provider right away if you experience any  of the following: ? Bleeding from an injury or your nose that does not stop. ? Unusual colored urine (red or dark brown) or unusual colored stools (red or black). ? Unusual bruising for unknown reasons. ? A serious fall or if you hit your head (even if there is no bleeding).  Some foods or medicines interact with Coumadin (warfarin) and might alter your response to warfarin. To help avoid this: ? Eat a balanced diet, maintaining a consistent amount of Vitamin K. ? Notify your provider about major diet changes you plan to make. ? Avoid alcohol or limit your intake to 1  drink for women and 2 drinks for men per day. (1 drink is 5 oz. wine, 12 oz. beer, or 1.5 oz. liquor.)  Make sure that ANY health care provider who prescribes medication for you knows that you are taking Coumadin (warfarin).  Also make sure the healthcare provider who is monitoring your Coumadin knows when you have started a new medication including herbals and non-prescription products.  Coumadin (Warfarin)  Major Drug Interactions  Increased Warfarin Effect Decreased Warfarin Effect  Alcohol (large quantities) Antibiotics (esp. Septra/Bactrim, Flagyl, Cipro) Amiodarone (Cordarone) Aspirin (ASA) Cimetidine (Tagamet) Megestrol (Megace) NSAIDs (ibuprofen, naproxen, etc.) Piroxicam (Feldene) Propafenone (Rythmol SR) Propranolol (Inderal) Isoniazid (INH) Posaconazole (Noxafil) Barbiturates (Phenobarbital) Carbamazepine (Tegretol) Chlordiazepoxide (Librium) Cholestyramine (Questran) Griseofulvin Oral Contraceptives Rifampin Sucralfate (Carafate) Vitamin K   Coumadin (Warfarin) Major Herbal Interactions  Increased Warfarin Effect Decreased Warfarin Effect  Garlic Ginseng Ginkgo biloba Coenzyme Q10 Green tea St. Johns wort    Coumadin (Warfarin) FOOD Interactions  Eat a consistent number of servings per week of foods HIGH in Vitamin K (1 serving =  cup)  Collards (cooked, or boiled & drained) Kale (cooked, or boiled & drained) Mustard greens (cooked, or boiled & drained) Parsley *serving size only =  cup Spinach (cooked, or boiled & drained) Swiss chard (cooked, or boiled & drained) Turnip greens (cooked, or boiled & drained)  Eat a consistent number of servings per week of foods MEDIUM-HIGH in Vitamin K (1 serving = 1 cup)  Asparagus (cooked, or boiled & drained) Broccoli (cooked, boiled & drained, or raw & chopped) Brussel sprouts (cooked, or boiled & drained) *serving size only =  cup Lettuce, raw (green leaf, endive, romaine) Spinach, raw Turnip greens, raw &  chopped   These websites have more information on Coumadin (warfarin):  http://www.king-russell.com/; https://www.hines.net/;  ------------------------------------------------------------------------------

## 2015-10-22 NOTE — Progress Notes (Signed)
ANTICOAGULATION CONSULT NOTE  Pharmacy Consult for Heparin + Coumadin  Indication: atrial fibrillation  Allergies  Allergen Reactions  . Yellow Dyes (Non-Tartrazine) Nausea And Vomiting    "deathly allergic" No other information given to explain reaction. Per Daughter- this was an IV dye. Ok with yellow coloring on Coumadin tablets.   . Codeine Hives  . Other Other (See Comments)    novocaine broke mouth out  . Penicillins Hives  . Procaine Hives    Patient Measurements: Height: 5\' 2"  (157.5 cm) Weight: 125 lb (56.7 kg) IBW/kg (Calculated) : 50.1 Heparin Dosing Weight: 58 kg   Vital Signs: Temp: 98.5 F (36.9 C) (05/28 1206) Temp Source: Oral (05/28 1206) BP: 142/44 mmHg (05/28 1206) Pulse Rate: 53 (05/28 1206)  Labs:  Recent Labs  10/20/15 1026 10/21/15 1056  10/21/15 1628 10/21/15 2120 10/22/15 0301 10/22/15 0629 10/22/15 0825 10/22/15 1226  HGB  --  9.1*  --   --   --  8.1*  --   --   --   HCT  --  30.9*  --   --   --  27.0*  --   --   --   PLT  --  236  --   --   --  230  --   --   --   LABPROT  --  16.3*  --   --   --   --  20.6*  --   --   INR 1.3 1.30  --   --   --   --  1.77*  --   --   HEPARINUNFRC  --   --   --   --   --  0.24*  --   --  0.45  CREATININE  --  1.73*  --  1.76*  --  1.78*  --   --   --   TROPONINI  --  <0.03  < >  --  0.05* 0.05*  --  0.05*  --   < > = values in this interval not displayed.  Estimated Creatinine Clearance: 19.6 mL/min (by C-G formula based on Cr of 1.78).   Medical History: Past Medical History  Diagnosis Date  . Hypertensive heart disease   . Neuropathy (HCC)   . Anxiety   . PONV (postoperative nausea and vomiting)   . Chronic diastolic CHF (congestive heart failure) (HCC)     a. 05/2015 Echo: EF 55-60%, mild LVH, no rwma, mild MR, mildly dil RA, mod TR, PASP .  Marland Kitchen COPD (chronic obstructive pulmonary disease) (HCC)     a. on home O2 @ 3lpm.  . Family history of adverse reaction to anesthesia   . Type II  diabetes mellitus (HCC)   . Macular degeneration, bilateral   . Varicose veins   . GIB (gastrointestinal bleeding)     a. History of GIB.  Marland Kitchen Paroxysmal atrial fibrillation (HCC)     a. CHA2DS2VASc = 6-->coumadin.  . Confusion state   . CKD (chronic kidney disease), stage IV (HCC)   . Elevated troponin     a. H/o elevated trop in setting of CHF; b. 06/2013 Lexiscan MV: nl EF, mild anterior breast attenuation, no ischemia.     Medications:  Prescriptions prior to admission  Medication Sig Dispense Refill Last Dose  . albuterol (PROVENTIL HFA;VENTOLIN HFA) 108 (90 BASE) MCG/ACT inhaler Inhale 2 puffs into the lungs every 6 (six) hours as needed for wheezing or shortness of breath. 1 Inhaler 3 10/21/2015  at Unknown time  . cholecalciferol (VITAMIN D) 1000 UNITS tablet Take 1,000 Units by mouth every morning.    10/20/2015 at Unknown time  . escitalopram (LEXAPRO) 10 MG tablet Take 10 mg by mouth daily.   10/20/2015 at Unknown time  . fenofibrate 160 MG tablet Take 160 mg by mouth daily.   10/20/2015 at Unknown time  . gabapentin (NEURONTIN) 100 MG capsule Take 1 capsule (100 mg total) by mouth at bedtime. 30 capsule 0 10/20/2015 at Unknown time  . hydrALAZINE (APRESOLINE) 25 MG tablet Take 1 tablet (25 mg total) by mouth every 12 (twelve) hours. 60 tablet 3 10/20/2015 at 7a  . iron polysaccharides (NIFEREX) 150 MG capsule Take 1 capsule (150 mg total) by mouth daily. 30 capsule 3 10/20/2015 at Unknown time  . isosorbide mononitrate (IMDUR) 30 MG 24 hr tablet TAKE 1 TABLET BY MOUTH EVERY DAY 30 tablet 6 10/20/2015 at Unknown time  . methimazole (TAPAZOLE) 5 MG tablet Take 1 tablet (5 mg total) by mouth daily. 30 tablet 3 10/20/2015 at Unknown time  . metoprolol tartrate (LOPRESSOR) 25 MG tablet Take 0.5 tablets (12.5 mg total) by mouth 2 (two) times daily. 60 tablet 3 10/20/2015 at 8a  . Multiple Vitamin (MULTIVITAMIN WITH MINERALS) TABS tablet Take 1 tablet by mouth daily.   10/20/2015 at Unknown time  .  OXYGEN Inhale 3 L into the lungs at bedtime.    Past Week at Unknown time  . potassium chloride SA (K-DUR,KLOR-CON) 20 MEQ tablet Take 20 mEq by mouth daily.   10/20/2015 at Unknown time  . torsemide (DEMADEX) 20 MG tablet Take 2 tablets by mouth in the morning and 1 tablet by mouth at night.   10/20/2015 at Unknown time  . atorvastatin (LIPITOR) 20 MG tablet Take 20 mg by mouth at bedtime.   Taking  . warfarin (COUMADIN) 2.5 MG tablet Take 1 tablet (2.5 mg total) by mouth daily at 6 PM. (Patient taking differently: Take 2.5-3.75 mg by mouth daily at 6 PM. 2.5 mg every day excpet on Tuesday its  3.75 mg) 30 tablet 2 Taking    Assessment: 49 YOF with h/o Afib on Coumadin prior to admission. INR is sub-therapeutic on admission. Pharmacy consulted to resume Coumadin and bridge with heparin. INR on admission is sub-therapeutic at 1.3.   Initial HL 0.45 (therapeutic) on heparin 850 units/hr. INR 1.77  Warfarin PTA dosing: 2.5mg  daily except 3.75mg  Tuesday   Goal of Therapy:  INR 2-3 Heparin level 0.3-0.7 units/ml Monitor platelets by anticoagulation protocol: Yes   Plan:  Continue heparin at 950 units/hr Obtain 8h HL Warfarin  x 1 tonight Monitor daily HL, INR, CBC and s/s of bleeding    Remi Haggard, PharmD Clinical Pharmacist- Resident Pager: [phone]

## 2015-10-22 NOTE — Progress Notes (Signed)
ANTICOAGULATION CONSULT NOTE  Pharmacy Consult for Heparin + Coumadin  Indication: atrial fibrillation  Allergies  Allergen Reactions  . Yellow Dyes (Non-Tartrazine) Nausea And Vomiting    "deathly allergic" No other information given to explain reaction. Per Daughter- this was an IV dye. Ok with yellow coloring on Coumadin tablets.   . Codeine Hives  . Other Other (See Comments)    novocaine broke mouth out  . Penicillins Hives  . Procaine Hives    Patient Measurements: Height: 5\' 2"  (157.5 cm) Weight: 128 lb 3.2 oz (58.151 kg) (bed sxcale) IBW/kg (Calculated) : 50.1 Heparin Dosing Weight: 58 kg   Vital Signs: Temp: 98.2 F (36.8 C) (05/28 0059) Temp Source: Oral (05/28 0059) BP: 142/56 mmHg (05/28 0059) Pulse Rate: 84 (05/28 0059)  Labs:  Recent Labs  10/20/15 1026 10/21/15 1056 10/21/15 1503 10/21/15 1628 10/21/15 2120 10/22/15 0301  HGB  --  9.1*  --   --   --  8.1*  HCT  --  30.9*  --   --   --  27.0*  PLT  --  236  --   --   --  230  LABPROT  --  16.3*  --   --   --   --   INR 1.3 1.30  --   --   --   --   HEPARINUNFRC  --   --   --   --   --  0.24*  CREATININE  --  1.73*  --  1.76*  --   --   TROPONINI  --  <0.03 0.03  --  0.05*  --     Estimated Creatinine Clearance: 19.8 mL/min (by C-G formula based on Cr of 1.76).   Medical History: Past Medical History  Diagnosis Date  . Hypertensive heart disease   . Neuropathy (HCC)   . Anxiety   . PONV (postoperative nausea and vomiting)   . Chronic diastolic CHF (congestive heart failure) (HCC)     a. 05/2015 Echo: EF 55-60%, mild LVH, no rwma, mild MR, mildly dil RA, mod TR, PASP .  Marland Kitchen COPD (chronic obstructive pulmonary disease) (HCC)     a. on home O2 @ 3lpm.  . Family history of adverse reaction to anesthesia   . Type II diabetes mellitus (HCC)   . Macular degeneration, bilateral   . Varicose veins   . GIB (gastrointestinal bleeding)     a. History of GIB.  Marland Kitchen Paroxysmal atrial fibrillation  (HCC)     a. CHA2DS2VASc = 6-->coumadin.  . Confusion state   . CKD (chronic kidney disease), stage IV (HCC)   . Elevated troponin     a. H/o elevated trop in setting of CHF; b. 06/2013 Lexiscan MV: nl EF, mild anterior breast attenuation, no ischemia.     Medications:  Prescriptions prior to admission  Medication Sig Dispense Refill Last Dose  . albuterol (PROVENTIL HFA;VENTOLIN HFA) 108 (90 BASE) MCG/ACT inhaler Inhale 2 puffs into the lungs every 6 (six) hours as needed for wheezing or shortness of breath. 1 Inhaler 3 10/21/2015 at Unknown time  . cholecalciferol (VITAMIN D) 1000 UNITS tablet Take 1,000 Units by mouth every morning.    10/20/2015 at Unknown time  . escitalopram (LEXAPRO) 10 MG tablet Take 10 mg by mouth daily.   10/20/2015 at Unknown time  . fenofibrate 160 MG tablet Take 160 mg by mouth daily.   10/20/2015 at Unknown time  . gabapentin (NEURONTIN) 100 MG capsule Take 1  capsule (100 mg total) by mouth at bedtime. 30 capsule 0 10/20/2015 at Unknown time  . hydrALAZINE (APRESOLINE) 25 MG tablet Take 1 tablet (25 mg total) by mouth every 12 (twelve) hours. 60 tablet 3 10/20/2015 at 7a  . iron polysaccharides (NIFEREX) 150 MG capsule Take 1 capsule (150 mg total) by mouth daily. 30 capsule 3 10/20/2015 at Unknown time  . isosorbide mononitrate (IMDUR) 30 MG 24 hr tablet TAKE 1 TABLET BY MOUTH EVERY DAY 30 tablet 6 10/20/2015 at Unknown time  . methimazole (TAPAZOLE) 5 MG tablet Take 1 tablet (5 mg total) by mouth daily. 30 tablet 3 10/20/2015 at Unknown time  . metoprolol tartrate (LOPRESSOR) 25 MG tablet Take 0.5 tablets (12.5 mg total) by mouth 2 (two) times daily. 60 tablet 3 10/20/2015 at 8a  . Multiple Vitamin (MULTIVITAMIN WITH MINERALS) TABS tablet Take 1 tablet by mouth daily.   10/20/2015 at Unknown time  . OXYGEN Inhale 3 L into the lungs at bedtime.    Past Week at Unknown time  . potassium chloride SA (K-DUR,KLOR-CON) 20 MEQ tablet Take 20 mEq by mouth daily.   10/20/2015 at  Unknown time  . torsemide (DEMADEX) 20 MG tablet Take 2 tablets by mouth in the morning and 1 tablet by mouth at night.   10/20/2015 at Unknown time  . atorvastatin (LIPITOR) 20 MG tablet Take 20 mg by mouth at bedtime.   Taking  . warfarin (COUMADIN) 2.5 MG tablet Take 1 tablet (2.5 mg total) by mouth daily at 6 PM. (Patient taking differently: Take 2.5-3.75 mg by mouth daily at 6 PM. 2.5 mg every day excpet on Tuesday its  3.75 mg) 30 tablet 2 Taking    Assessment: 68 YOF with h/o Afib on Coumadin prior to admission. INR is sub-therapeutic on admission. Pharmacy consulted to resume Coumadin and bridge with heparin. INR on admission is sub-therapeutic at 1.3.   Initial HL is subtherapeutic at 0.24 on heparin 850 units/hr. Nurse reports no issues with infusion or bleeding.   Goal of Therapy:  INR 2-3 Heparin level 0.3-0.7 units/ml Monitor platelets by anticoagulation protocol: Yes   Plan:  Increase heparin to 950 units/hr 8h HL Monitor daily HL, CBC and s/s of bleeding   Arlean Hopping. Newman Pies, PharmD, BCPS Clinical Pharmacist Pager [phone]

## 2015-10-22 NOTE — Progress Notes (Signed)
Family Medicine Teaching Service Daily Progress Note Intern Pager: [phone]  Patient name: Alexis [name] Medical record number: [id number] Date of birth: 1931-07-22 Age: 82 y.o. Gender: female  Primary Care Provider: [name], PA-C Consultants: Cardiology Code Status: DNR  Alexis [name] Overview and Major Events to Date:  5/27: Admitted to FMTS with acute on chronic diastolic HF, new ST depression in anterolateral leads.  Assessment and Plan: Alexis [name] is a 82 y.o. female presenting with Shortness of Breath, found to be in acute on chronic diastolic heart failure. PMH is significant for Chronic Hypoxic Respiratory Failure, COPD, Chronic Diastolic Heart Failure, Persistent Atrial Fibrillation, HTN, Anemia, Hyperthyroidism, CKD IV  # COPD / Chronic Hypoxic Respiratory Failure - Starting yesterday, Alexis [name] has developed a cough productive of yellow sputum. No fevers, no endorses chills. On exam, diffuse inspiratory and expiratory wheezing. Takes albuterol only despite chronic O2 requirement.  - Albuterol prn - Will treat with Levaquin every other day x 5 days (renal-adjusted dose) - Will start Prednisone  for 5 day course.  # Acute on Chronic Diastolic HF, mild- On Torsemide at home-  in the am and  at night. CXR on admission showing mild pulmonary edema and minimal pleural effusions. Weight 128lb (dry weight) > 125lb this morning. UOP 850cc. Requiring 2L O2 overnight. Cr stable- 1.76 > 1.78. BNP 1200 (elevated from 800-900 most recently). On exam, no crackles or lower extremity edema, but diffuse inspiratory and expiratory wheezing heard.  - s/p Lasix  IV x 1 in the ED - Cardiology consulted. Recommend continuing Lasix  IV bid.  - Continue O2 to keep sat > 92% - Incentive spirometry - BP and HR control  # Inferolateral ST depression, resolved - New on EKG in the ED. Repeat EKG this morning showing resolved ST depressions. Nuclear stress test 06/2013 low risk study.  Echo 05/2015 with preserved EF and without segmental wall motion abnormalities. May be related to Afib. Troponins mildly elevated: 0.03 > 0.03 > 0.05 > 0.05 > 0.05. - Cardiology consulted. Recommend daily EKG. Trend troponins. - Control HR - TSH recently was normal.  - Lipid panel pending  # Atrial Fibrillation - Low 100s on admission, improved to 70s-80s this morning. Cardioverted at recent hospitalization 4/24. On Coumadin with CHADSVASC - 6. INR subtherapeutic on admission at 1.3 > 1.77 this morning.  - Cardiology consulted. Recommend continuing Metoprolol  bid, may need to increase to q6hrs if breakthrough RVR between doses. - Coumadin per pharm - Plan to d/c Heparin when INR is therapeutic.  # Anemia - Iron deficiency. Hbg 9.1 > 8.1 this morning. Recent baseline 7-8. - Daily CBCs  # CKD IV - Cr 1.76 > 1.78. Baseline is 2.4-2.5 - Daily BMETs - Avoid nephrotoxic agents  # DMII - it appears she has this, though it is not listed as a problem. She is not on any medications for this. A1C (08/2015) is 8.5. CBGs 161-318. - Will start sensitive SSI. - CBGs with meals and at bedtime  # Depression: stable - continue lexapro.   # Hyperthyroidism - on Methimazole. TSH in 08/2015 is 1.38, T3, T4 essentially normal.  - Continue methimazole  daily here.   # Peripheral Neuropathy  - Contine Gabapentin  at bedtime  FEN/GI: Heart healthy / carb modified.  Prophylaxis: On Coumadin. Heparin started 5/27 due to subtherapeutic INR. Will d/c Heparin when INR is therapeutic.  Disposition: Pending adequate diuresis and cardiac work-up.  Subjective:  Alexis [name] states that her cough is really bothersome.  The cough started yesterday and is productive of yellow sputum. She otherwise feels fine. She denies any chest pain. She denies fever but endorses chills.  Objective: Temp:  [97.6 F (36.4 C)-99.6 F (37.6 C)] 97.6 F (36.4 C) (05/28 0622) Pulse Rate:  [40-128] 84 (05/28 0059) Resp:   [10-22] 17 (05/28 0622) BP: (118-175)/(50-80) 135/53 mmHg (05/28 0622) SpO2:  [92 %-98 %] 98 % (05/28 0622) Weight:  [125 lb (56.7 kg)-128 lb 3.2 oz (58.151 kg)] 125 lb (56.7 kg) (05/28 0622) Physical Exam: General: Sitting up in chair, in NAD, pleasant HEENT: [place]/AT, EOMI, MMM Neck: FROM, supple.  Cardiovascular: Irregularly irregular rhythm, normal rate Respiratory:  in place, normal work of breathing, diffuse inspiratory and expiratory wheezing, no crackles Abdomen: S, NT, ND, +BS.  MSK: WWP, no edema, 2+ distal pulses.  Skin: no rashes, no lesiosn.  Neuro: grossly in tact.  Psych: appropriate mood / affect.   Laboratory:  Recent Labs Lab 10/21/15 1056 10/22/15 0301  WBC 8.0 9.7  HGB 9.1* 8.1*  HCT 30.9* 27.0*  PLT 236 230    Recent Labs Lab 10/21/15 1056 10/21/15 1628 10/22/15 0301  NA 139 140 139  K 3.4* 3.8 4.5  CL 105 104 104  CO2 22 26 27   BUN 32* 32* 33*  CREATININE 1.73* 1.76* 1.78*  CALCIUM 9.3 9.4 9.1  PROT  --  6.9  --   BILITOT  --  0.8  --   ALKPHOS  --  73  --   ALT  --  19  --   AST  --  26  --   GLUCOSE 156* 222* 237*   BNP - 1200 Troponins: 0.03, 0.03, 0.05, 0.05, 0.05  Imaging/Diagnostic Tests: EKG (5/27) - st depression anterolateral leads.  EKG (5/28)- no ST depressions, Afib w/ HR of 63  Campbell Stall, MD 10/22/2015, 8:59 AM PGY-1, [facility] Family Medicine FPTS Intern pager: [phone], text pages welcome

## 2015-10-23 DIAGNOSIS — R42 Dizziness and giddiness: Secondary | ICD-10-CM

## 2015-10-23 LAB — CBC
HEMATOCRIT: 26.9 % — AB (ref 36.0–46.0)
HEMOGLOBIN: 7.9 g/dL — AB (ref 12.0–15.0)
MCH: 23.9 pg — AB (ref 26.0–34.0)
MCHC: 29.4 g/dL — AB (ref 30.0–36.0)
MCV: 81.3 fL (ref 78.0–100.0)
Platelets: 222 10*3/uL (ref 150–400)
RBC: 3.31 MIL/uL — ABNORMAL LOW (ref 3.87–5.11)
RDW: 19.1 % — AB (ref 11.5–15.5)
WBC: 8.1 10*3/uL (ref 4.0–10.5)

## 2015-10-23 LAB — BASIC METABOLIC PANEL
ANION GAP: 7 (ref 5–15)
BUN: 41 mg/dL — AB (ref 6–20)
CO2: 30 mmol/L (ref 22–32)
Calcium: 9.1 mg/dL (ref 8.9–10.3)
Chloride: 104 mmol/L (ref 101–111)
Creatinine, Ser: 1.8 mg/dL — ABNORMAL HIGH (ref 0.44–1.00)
GFR calc Af Amer: 29 mL/min — ABNORMAL LOW (ref >60)
GFR, EST NON AFRICAN AMERICAN: 25 mL/min — AB (ref >60)
GLUCOSE: 140 mg/dL — AB (ref 65–99)
POTASSIUM: 3.5 mmol/L (ref 3.5–5.1)
Sodium: 141 mmol/L (ref 135–145)

## 2015-10-23 LAB — GLUCOSE, CAPILLARY
GLUCOSE-CAPILLARY: 111 mg/dL — AB (ref 65–99)
GLUCOSE-CAPILLARY: 201 mg/dL — AB (ref 65–99)

## 2015-10-23 LAB — TROPONIN I: TROPONIN I: 0.03 ng/mL (ref <0.031)

## 2015-10-23 LAB — PROTIME-INR
INR: 1.88 — ABNORMAL HIGH (ref 0.00–1.49)
Prothrombin Time: 21.6 seconds — ABNORMAL HIGH (ref 11.6–15.2)

## 2015-10-23 LAB — HEPARIN LEVEL (UNFRACTIONATED): Heparin Unfractionated: 0.34 IU/mL (ref 0.30–0.70)

## 2015-10-23 MED ORDER — PREDNISONE 20 MG PO TABS: 40.0000 mg | ORAL_TABLET | Freq: Every day | ORAL | Status: DC

## 2015-10-23 MED ORDER — WARFARIN SODIUM 2.5 MG PO TABS: 3.7500 mg | ORAL_TABLET | Freq: Every day | ORAL | Status: DC

## 2015-10-23 MED ORDER — TORSEMIDE 20 MG PO TABS: 40.0000 mg | ORAL_TABLET | Freq: Every day | ORAL | Status: DC

## 2015-10-23 MED ORDER — PREDNISONE 20 MG PO TABS
40.0000 mg | ORAL_TABLET | Freq: Every day | ORAL | Status: DC
  Administered 2015-10-23: 40 mg via ORAL
  Filled 2015-10-23: qty 2

## 2015-10-23 MED ORDER — METOPROLOL TARTRATE 25 MG PO TABS: 25.0000 mg | ORAL_TABLET | Freq: Two times a day (BID) | ORAL | Status: DC

## 2015-10-23 MED ORDER — LEVOFLOXACIN 500 MG PO TABS: 500.0000 mg | ORAL_TABLET | ORAL | Status: DC

## 2015-10-23 NOTE — Progress Notes (Signed)
Family Medicine Teaching Service Daily Progress Note Intern Pager: [phone]  Patient name: Alexis [name] Medical record number: [id number] Date of birth: December 06, 1931 Age: 82 y.o. Gender: female  Primary Care Provider: [name], PA-C Consultants: Cardiology Code Status: DNR  Pt Overview and Major Events to Date:  5/27: Admitted to FMTS with acute on chronic diastolic HF, new ST depression in anterolateral leads.  Assessment and Plan: Alexis [name] is a 82 y.o. female presenting with Shortness of Breath, found to be in acute on chronic diastolic heart failure. PMH is significant for Chronic Hypoxic Respiratory Failure, COPD, Chronic Diastolic Heart Failure, Persistent Atrial Fibrillation, HTN, Anemia, Hyperthyroidism, CKD IV  # COPD Exacerbation / Chronic Hypoxic Respiratory Failure: Stable on 2L O2 by [place] overnight. On exam, mild diffuse inspiratory and expiratory wheezing improved from yesterday, with occasional crackles. States her cough has improved. - Albuterol prn - Levaquin every other day x 5 days (renal-adjusted dose).  - Prednisone 40mg  for 5 day course. - PT consult  # Acute on Chronic Diastolic HF, mild- On Torsemide at home- 40mg  in the am and 20mg  at night. CXR on admission showing mild pulmonary edema and minimal pleural effusions. Weight 128lb (dry weight) > 124lb this morning. UOP 2.3L. Cr stable- 1.78 > 1.8 (baseline 2.1-2.5). Occasional crackles on exam. Having some dizziness yesterday. - s/p Lasix 40mg  IV x 1 in the ED - Cardiology consulted. Has been on Lasix 40mg  IV bid. Will plan to transition to Torsemide 40mg  daily today. - Continue O2 to keep sat > 92% - Incentive spirometry - BP and HR control  # Inferolateral ST depressions, resolved - New on EKG in the ED. Repeat EKG from yesterday and this morning showing resolved ST depressions.  Nuclear stress test 06/2013 low risk study. Echo 05/2015 with preserved EF and without segmental wall motion  abnormalities. May be related to Afib. Troponins mildly elevated: 0.03 > 0.03 > 0.05 > 0.05 > 0.05 > 0.03. Lipid panel: Chol 132, TG 62, HDL 48, LDL 72. - Cardiology consulted. Recommend daily EKG.  - Control HR  # Atrial Fibrillation, resolved - Low 100s on admission, improved to 50s this morning. Cardioverted at recent hospitalization 4/24. On Coumadin with CHADSVASC - 6. INR subtherapeutic on admission at 1.3 > 1.77 > 1.88 this morning. Not in A-fib this morning. EKG shows NSR. - Cardiology consulted. Recommend continuing Metoprolol 25mg  bid, may need to increase to q6hrs if breakthrough RVR between doses. - Coumadin per pharm - Plan to d/c Heparin when INR is therapeutic.  # Anemia - Likely anemia of chronic disease. Last ferritin normal. Hbg 9.1 > 8.1 > 7.9 this morning. Recent baseline 7-8. - Daily CBCs  # CKD IV - Cr 1.76 > 1.78 > 1.80. Baseline is 2.4-2.5 - Daily BMETs - Avoid nephrotoxic agents  # DMII - it appears she has this, though it is not listed as a problem. She is not on any medications for this. A1C (08/2015) is 8.5. CBGs 111-161. - Will start sensitive SSI. - CBGs with meals and at bedtime  # Depression: stable - Continue Lexapro.   # Hyperthyroidism - On Methimazole. TSH in 08/2015 is 1.38, T3, T4 essentially normal.  - Continue Methimazole 5mg  daily.  # Peripheral Neuropathy  - Contine Gabapentin 100mg  at bedtime  FEN/GI: Heart healthy / carb modified.  Prophylaxis: On Coumadin. Heparin started 5/27 due to subtherapeutic INR. Will d/c Heparin when INR is therapeutic.  Disposition: Possible discharge home today vs tomorrow. Awaiting PT  recommendations.  Subjective:  Pt states she is doing well this morning. She denies fevers or chills. She is still having a cough, but feels that it is improving. She has been out of bed to use the bathroom and to sit in the chair yesterday. Having a little bit of dizziness with standing yesterday. No concerns this  morning.  Objective: Temp:  [97.5 F (36.4 C)-98.5 F (36.9 C)] 97.9 F (36.6 C) (05/29 0625) Pulse Rate:  [53-59] 57 (05/29 0625) Resp:  [18-20] 20 (05/29 0625) BP: (125-150)/(35-56) 134/56 mmHg (05/29 0625) SpO2:  [96 %-97 %] 96 % (05/29 0625) Weight:  [124 lb 6.4 oz (56.427 kg)] 124 lb 6.4 oz (56.427 kg) (05/29 0555) Physical Exam: General: Sitting up in bed, eating breakfast, in NAD, pleasant HEENT: [place]/AT, EOMI, MMM Neck: FROM, supple.  Cardiovascular: RRR, no murmurs. Respiratory: [place] in place, normal work of breathing, mild diffuse inspiratory and expiratory wheezes improved from yesterday, occasional crackles Abdomen: S, NT, ND, +BS.  MSK: WWP, no edema, 2+ distal pulses.  Skin: no rashes, no lesiosn.  Neuro: grossly in tact.  Psych: appropriate mood / affect.   Laboratory:  Recent Labs Lab 10/21/15 1056 10/22/15 0301 10/23/15 0311  WBC 8.0 9.7 8.1  HGB 9.1* 8.1* 7.9*  HCT 30.9* 27.0* 26.9*  PLT 236 230 222    Recent Labs Lab 10/21/15 1628 10/22/15 0301 10/23/15 0311  NA 140 139 141  K 3.8 4.5 3.5  CL 104 104 104  CO2 BUN 32* 33* 41*  CREATININE 1.76* 1.78* 1.80*  CALCIUM 9.4 9.1 9.1  PROT 6.9  --   --   BILITOT 0.8  --   --   ALKPHOS 73  --   --   ALT 19  --   --   AST 26  --   --   GLUCOSE 222* 237* 140*   BNP - 1200 Troponins: 0.03, 0.03, 0.05, 0.05, 0.05  Imaging/Diagnostic Tests: EKG (5/27) - st depression anterolateral leads.  EKG (5/28) - no ST depressions, Afib w/ HR of 63 EKG (5/29) - NSR, no ST depressions  Campbell Stall, MD 10/23/2015, 7:25 AM PGY-1, [facility] Family Medicine FPTS Intern pager: [phone], text pages welcome

## 2015-10-23 NOTE — Care Management Note (Addendum)
Case Management Note  Patient Details  Name: KAMEE BOBST MRN: [id number] Date of Birth: [birth year]  Subjective/Objective:        CM following for progression and d/c planning.             Action/Plan: 10/23/15 Noted consult re CHF HH, also noted that this pt is currently "closely" followed by her cardiologist, Dr Debara Pickett. Please order [facility] services as appropriate. She has home oxygen and will ask [facility] to eval for ongoing followup.  CM will follow for PT/OT recommendations and DME needs, as well as [facility] services. 12:10pm Met with pt and son, pt lives with son, who is very supportive, has home oxygen which she uses at night and prn, has walker, 3:1 and shower chair. Has uses [facility] for [facility] previously and wishes to use that agency again. Discussed [facility] and referral made. [facility] notified of [place] needs.   Expected Discharge Date:                  Expected Discharge Plan:  [place]  In-House Referral:     Discharge planning Services  CM Consult  Post Acute Care Choice:    Choice offered to:     DME Arranged:    DME Agency:     HH Arranged:  HHPT HH Agency:  [facility].   Status of Service:  In process, will continue to follow  Medicare Important Message Given:    Date Medicare IM Given:    Medicare IM give by:    Date Additional Medicare IM Given:    Additional Medicare Important Message give by:     If discussed at [place] of Stay Meetings, dates discussed:    Additional Comments:  Adron Bene, RN 10/23/2015, 11:35 AM

## 2015-10-23 NOTE — Discharge Summary (Signed)
Family Medicine Teaching [facility] Discharge Summary  Patient name: Alexis [name] Medical record number: [id number] Date of birth: 03-19-1932 Age: 82 y.o. Gender: female Date of Admission: 10/21/2015  Date of Discharge: 10/23/15 Admitting Physician: Leighton Roach McDiarmid, MD  Primary Care Provider: [name], PA-C Consultants: Cardiology  Indication for Hospitalization: Shortness of breath  Discharge Diagnoses/Problem List:  COPD exacerbation Acute on Chronic Diastolic Heart Failure Atrial Fibrillation Anemia of Chronic Disease CKD IV Type II DM Hyperthyroidism Depression  Disposition: Home with HHPT  Discharge Condition: Stable, improved  Discharge Exam:  General: Sitting up in bed, eating breakfast, in NAD, pleasant HEENT: [place]/AT, EOMI, MMM Neck: FROM, supple.  Cardiovascular: RRR, no murmurs Respiratory: Gans in place, normal work of breathing, mild diffuse inspiratory and expiratory wheezes improved from yesterday, occasional crackles Abdomen: S, NT, ND, +BS.  MSK: WWP, no edema, 2+ distal pulses.  Skin: no rashes, no lesiosn.  Neuro: grossly intact.  Psych: appropriate mood / affect.  Brief Hospital Course:  Alexis [name] is a 82 year old female with a PMH of chronic hypoxic respiratory failure, COPD, chronic diastolic heart failure, atrial fibrillation, HTN, anemia, hyperthyroidism, and CKD IV who presented to the ED with shortness of breath. She was in A-fib with HRs in the low 100s. CXR showed pulmonary congestion. BNP was 1283. EKG showed new ST depressions in the inferolateral leads. She was admitted for further management. Hospital course is described by problem list below.  Shortness of breath likely 2/2 COPD exacerbation vs CHF exacerbation: On admission, she was requiring 3L O2 by , which is the same amount of O2 that she requires at home. We initially thought she was having a CHF exacerbation because of her elevated BNP, crackles on exam, and  CXR consistent with cardiomegaly. She was given IV Lasix  x 1 in the ED. Cardiology was consulted and recommended continuing Lasix  IV bid.  On reassessment, she stated that she had developed a cough productive of yellow sputum and was noted to have diffuse inspiratory and expiratory wheezing. She was started on Levaquin  every other day x 5 days (given her CKD IV) and Prednisone  daily for a 5 day course. Her breathing and cough subsequently improved and she was stable on 2L O2 on the day of discharge. She was transitioned to Torsemide  daily by Cardiology and was discharged on this dose.  Inferolateral ST depressions: Troponins peaked at 0.05 and then trended down to 0.03. Cardiology was consulted and recommended HR control and daily EKG. Subsequent EKGs showed resolution of the ST depressions after HR improved to the 50s-60s.  Atrial Fibrillation: HR was elevated to the low 100s on admission. Lopressor dose was increased from 12.5mg  bid to  bid. HRs improved and Pt was discharged home on this dose. Pt takes Coumadin for A-fib and was noted to have a subtherapeutic INR to 1.33 on admission. She was bridged with Heparin and had an INR to 1.80 on the day of discharge. She was instructed to take Warfarin 3.75mg  daily and to follow-up in coumadin clinic in 3 days for recheck of INR.  Deconditioning: Pt was evaluated by PT, who recommended HHPT.  Issues for Follow Up:  1. Please make sure Pt has taken Levaquin and Prednisone as prescribed. 2. Follow-up fluid status and adjust Torsemide dose as appropriate.  3. Follow-up heart rate and see if Pt is tolerating the Lopressor at an increased dose. 4. Please make sure Pt has been to Coumadin clinic to follow-up  her subtherapeutic INR.  Significant Procedures: None  Significant Labs and Imaging:   Recent Labs Lab 10/21/15 1056 10/22/15 0301 10/23/15 0311  WBC 8.0 9.7 8.1  HGB 9.1* 8.1* 7.9*  HCT 30.9* 27.0* 26.9*  PLT 236  230 222    Recent Labs Lab 10/21/15 1056 10/21/15 1628 10/22/15 0301 10/23/15 0311  NA 139 140 139 141  K 3.4* 3.8 4.5 3.5  CL 105 104 104 104  CO2 22 26 27 30   GLUCOSE 156* 222* 237* 140*  BUN 32* 32* 33* 41*  CREATININE 1.73* 1.76* 1.78* 1.80*  CALCIUM 9.3 9.4 9.1 9.1  MG  --  2.0  --   --   ALKPHOS  --  73  --   --   AST  --  26  --   --   ALT  --  19  --   --   ALBUMIN  --  3.1*  --   --    BNP - 1200 Troponins: 0.03, 0.03, 0.05, 0.05, 0.05, 0.03  EKG (5/27) - st depression anterolateral leads.  EKG (5/28) - no ST depressions, Afib w/ HR of 63 EKG (5/29) - NSR, no ST depressions  CXR (5/27): Cardiomegaly with mild bilateral pulmonary edema and minimal pleural effusions, consistent with CHF.  Results/Tests Pending at Time of Discharge: None  Discharge Medications:    Medication List    TAKE these medications        albuterol 108 (90 Base) MCG/ACT inhaler  Commonly known as:  PROVENTIL HFA;VENTOLIN HFA  Inhale 2 puffs into the lungs every 6 (six) hours as needed for wheezing or shortness of breath.     cholecalciferol 1000 units tablet  Commonly known as:  VITAMIN D  Take 1,000 Units by mouth every morning.     escitalopram 10 MG tablet  Commonly known as:  LEXAPRO  Take 10 mg by mouth daily.     fenofibrate 160 MG tablet  Take 160 mg by mouth daily.     gabapentin 100 MG capsule  Commonly known as:  NEURONTIN  Take 1 capsule (100 mg total) by mouth at bedtime.     hydrALAZINE 25 MG tablet  Commonly known as:  APRESOLINE  Take 1 tablet (25 mg total) by mouth every 12 (twelve) hours.     iron polysaccharides 150 MG capsule  Commonly known as:  NIFEREX  Take 1 capsule (150 mg total) by mouth daily.     isosorbide mononitrate 30 MG 24 hr tablet  Commonly known as:  IMDUR  TAKE 1 TABLET BY MOUTH EVERY DAY     levofloxacin 500 MG tablet  Commonly known as:  LEVAQUIN  Take 1 tablet (500 mg total) by mouth every other day. Please take one tablet on  5/30 and one tablet on 6/1.     LIPITOR 20 MG tablet  Generic drug:  atorvastatin  Take 20 mg by mouth at bedtime.     methimazole 5 MG tablet  Commonly known as:  TAPAZOLE  Take 1 tablet (5 mg total) by mouth daily.     metoprolol tartrate 25 MG tablet  Commonly known as:  LOPRESSOR  Take 1 tablet (25 mg total) by mouth 2 (two) times daily.     multivitamin with minerals Tabs tablet  Take 1 tablet by mouth daily.     OXYGEN  Inhale 3 L into the lungs at bedtime.     potassium chloride SA 20 MEQ tablet  Commonly known as:  K-DUR,KLOR-CON  Take 20 mEq by mouth daily.     predniSONE 20 MG tablet  Commonly known as:  DELTASONE  Take 2 tablets (40 mg total) by mouth daily with breakfast.     torsemide 20 MG tablet  Commonly known as:  DEMADEX  Take 2 tablets (40 mg total) by mouth daily.  Start taking on:  10/24/2015     warfarin 2.5 MG tablet  Commonly known as:  COUMADIN  Take 1.5 tablets (3.75 mg total) by mouth daily at 6 PM.        Discharge Instructions: Please refer to Patient Instructions section of EMR for full details.  Patient was counseled important signs and symptoms that should prompt return to medical care, changes in medications, dietary instructions, activity restrictions, and follow up appointments.   Follow-Up Appointments:     Follow-up Information    Follow up with BEAL, [name], PA-C.   Specialty:  Physician Assistant   Why:  Please call ASAP to schedule a hospital follow-up appointment this week.   Contact information:   [address] [place] [address] [phone]       Follow up with [facility].   Specialty:  Cardiology   Why:  Please schedule an anti-coagulation visit for 5/31 or 6/1   Contact information:   [address] [place] [address] [phone]      Campbell Stall, MD 10/23/2015, 12:53 PM PGY-1, [facility]

## 2015-10-23 NOTE — Progress Notes (Signed)
SUBJECTIVE: Feels better but has not done much yet today Was in bed yesterday; complaining of orthostasis in hospital but not at home  ROS: Other than pertinent positives in "Subjective", all others were reviewed and found to be negative.   Intake/Output Summary (Last 24 hours) at 10/23/15 0857 Last data filed at 10/23/15 1610  Gross per 24 hour  Intake   1308 ml  Output   2326 ml  Net  -1018 ml    Current Facility-Administered Medications  Medication Dose Route Frequency Provider Last Rate Last Dose  . 0.9 %  sodium chloride infusion  250 mL Intravenous PRN Yolande Jolly, MD      . acetaminophen (TYLENOL) tablet 650 mg  650 mg Oral Q4H PRN Caleb G Melancon, MD      . albuterol (PROVENTIL) (2.5 MG/3ML) 0.083% nebulizer solution 3 mL  3 mL Inhalation Q4H PRN Campbell Stall, MD   3 mL at 10/22/15 1524  . atorvastatin (LIPITOR) tablet 20 mg  20 mg Oral QHS Yolande Jolly, MD   20 mg at 10/22/15 2120  . escitalopram (LEXAPRO) tablet 10 mg  10 mg Oral Daily Yolande Jolly, MD   10 mg at 10/22/15 0918  . fenofibrate tablet 160 mg  160 mg Oral Daily Yolande Jolly, MD   160 mg at 10/22/15 0918  . furosemide (LASIX) injection 40 mg  40 mg Intravenous BID Campbell Stall, MD   40 mg at 10/22/15 1740  . gabapentin (NEURONTIN) capsule 100 mg  100 mg Oral QHS Hillery Hunter Melancon, MD   100 mg at 10/22/15 2120  . heparin ADULT infusion 100 units/mL (25000 units/234mL sodium chloride 0.45%)  950 Units/hr Intravenous Continuous Marquita Palms, RPH 9.5 mL/hr at 10/22/15 1914 950 Units/hr at 10/22/15 1914  . hydrALAZINE (APRESOLINE) tablet 25 mg  25 mg Oral Q12H Yolande Jolly, MD   25 mg at 10/22/15 2120  . insulin aspart (novoLOG) injection 0-9 Units  0-9 Units Subcutaneous TID WC Campbell Stall, MD   Stopped at 10/23/15 [phone]  . iron polysaccharides (NIFEREX) capsule 150 mg  150 mg Oral Daily Yolande Jolly, MD   150 mg at 10/22/15 0918  . isosorbide mononitrate (IMDUR) 24 hr tablet 30  mg  30 mg Oral Daily Yolande Jolly, MD   30 mg at 10/22/15 0918  . levofloxacin (LEVAQUIN) tablet 500 mg  500 mg Oral Q48H Campbell Stall, MD      . methimazole (TAPAZOLE) tablet 5 mg  5 mg Oral Daily Yolande Jolly, MD   5 mg at 10/22/15 1017  . metoprolol tartrate (LOPRESSOR) tablet 25 mg  25 mg Oral BID Yolande Jolly, MD   25 mg at 10/22/15 2120  . ondansetron (ZOFRAN) injection 4 mg  4 mg Intravenous Q6H PRN Yolande Jolly, MD      . predniSONE (DELTASONE) tablet 40 mg  40 mg Oral Q breakfast Todd D McDiarmid, MD      . sodium chloride flush (NS) 0.9 % injection 3 mL  3 mL Intravenous Q12H Yolande Jolly, MD   3 mL at 10/22/15 2121  . sodium chloride flush (NS) 0.9 % injection 3 mL  3 mL Intravenous PRN Yolande Jolly, MD      . Warfarin - Pharmacist Dosing Inpatient   Does not apply q1800 Sampson Si, [facility]        Filed Vitals:  10/22/15 1527 10/22/15 2023 10/23/15 0555 10/23/15 0625  BP:  150/44  134/56  Pulse:  59  57  Temp:  97.5 F (36.4 C)  97.9 F (36.6 C)  TempSrc:  Oral  Oral  Resp:  18  20  Height:      Weight:   124 lb 6.4 oz (56.427 kg)   SpO2: 96% 97%  96%    PHYSICAL EXAM General: NAD HEENT: Normal. Neck: No JVD, no thyromegaly.  Lungs: Pronounced expiratory wheezes b/l, no rales CV: Nondisplaced PMI.  Regular rate and rhythm, normal S1/S2, no S3/S4, no murmur.  No pretibial edema.    Abdomen: Soft, nontender, no distention.  Neurologic: Alert and oriented x 3.  Psych: Normal affect. Musculoskeletal: No gross deformities. Extremities: No clubbing or cyanosis.     LABS: Basic Metabolic Panel:  Recent Labs  97/35/32 1628 10/22/15 0301 10/23/15 0311  NA 140 139 141  K 3.8 4.5 3.5  CL 104 104 104  CO2 26 27 30   GLUCOSE 222* 237* 140*  BUN 32* 33* 41*  CREATININE 1.76* 1.78* 1.80*  CALCIUM 9.4 9.1 9.1  MG 2.0  --   --    Liver Function Tests:  Recent Labs  10/21/15 1628  AST 26  ALT 19  ALKPHOS 73  BILITOT 0.8  PROT  6.9  ALBUMIN 3.1*   No results for input(s): LIPASE, AMYLASE in the last 72 hours. CBC:  Recent Labs  10/21/15 1056 10/22/15 0301 10/23/15 0311  WBC 8.0 9.7 8.1  NEUTROABS 6.8  --   --   HGB 9.1* 8.1* 7.9*  HCT 30.9* 27.0* 26.9*  MCV 81.5 80.4 81.3  PLT 236 230 222   Cardiac Enzymes:  Recent Labs  10/22/15 1408 10/22/15 2030 10/23/15 0311  TROPONINI 0.05* 0.05* 0.03   BNP: Invalid input(s): POCBNP D-Dimer: No results for input(s): DDIMER in the last 72 hours. Hemoglobin A1C: No results for input(s): HGBA1C in the last 72 hours. Fasting Lipid Panel:  Recent Labs  10/22/15 1002  CHOL 132  HDL 48  LDLCALC 72  TRIG 62  CHOLHDL 2.8   Thyroid Function Tests:  Recent Labs  10/21/15 1628  TSH 0.970   Anemia Panel: No results for input(s): VITAMINB12, FOLATE, FERRITIN, TIBC, IRON, RETICCTPCT in the last 72 hours.  RADIOLOGY: Dg Chest 2 View  10/21/2015  CLINICAL DATA:  Respiratory distress. EXAM: CHEST  2 VIEW COMPARISON:  Sep 26, 2015. FINDINGS: Stable cardiomegaly. No pneumothorax is noted. Minimal bilateral pleural effusions are noted posteriorly. Mild central pulmonary vascular congestion is noted with probable bilateral perihilar and basilar edema. Bony thorax is unremarkable. IMPRESSION: Cardiomegaly with mild bilateral pulmonary edema and minimal pleural effusions are noted consistent with congestive heart failure. Electronically Signed   By: Lupita Raider, M.D.   On: 10/21/2015 11:42   Dg Chest 2 View  09/26/2015  CLINICAL DATA:  Larey Seat tonight, striking head and left chest. Patient is on Coumadin. EXAM: CHEST  2 VIEW COMPARISON:  09/19/2015 FINDINGS: Diffuse enlargement of the cardiac silhouette with mild pulmonary vascular congestion and interstitial edema. Small bilateral pleural effusions with basilar atelectasis. No pneumothorax. Old appearing left rib fractures. IMPRESSION: Cardiac enlargement with mild pulmonary vascular congestion interstitial edema.  Small bilateral pleural effusions. Electronically Signed   By: Burman Nieves M.D.   On: 09/26/2015 04:54   Ct Head Wo Contrast  09/26/2015  CLINICAL DATA:  Moderate left occipital headache after a fall. Struck head on a closet door. No loss  of consciousness. On Coumadin. EXAM: CT HEAD WITHOUT CONTRAST CT CERVICAL SPINE WITHOUT CONTRAST TECHNIQUE: Multidetector CT imaging of the head and cervical spine was performed following the standard protocol without intravenous contrast. Multiplanar CT image reconstructions of the cervical spine were also generated. COMPARISON:  CT head 04/23/2009.  MRI brain 04/12/2009. FINDINGS: CT HEAD FINDINGS Diffuse cerebral atrophy. Mild ventricular dilatation consistent with central atrophy. Low-attenuation changes throughout the deep white matter consistent with small vessel ischemia. No mass effect or midline shift. No abnormal extra-axial fluid collections. Gray-white matter junctions are distinct. Basal cisterns are not effaced. No evidence of acute intracranial hemorrhage. No depressed skull fractures. Visualized paranasal sinuses and mastoid air cells are not opacified. CT CERVICAL SPINE FINDINGS Normal alignment of the cervical spine. Diffuse degenerative change with narrowed cervical interspaces and endplate hypertrophic changes throughout. Mild degenerative changes in the cervical facet joints. No vertebral compression deformities. No prevertebral soft tissue swelling. No focal bone lesion or bone destruction. C1-2 articulation appears intact. Soft tissues are unremarkable. Diffuse nodular enlargement of the thyroid gland. Calcifications in the right thyroid. Right pleural effusion. Interstitial changes in the lung apices may indicate edema. IMPRESSION: No acute intracranial abnormalities. Chronic atrophy and small vessel ischemic changes. Normal alignment of the cervical spine. Diffuse degenerative change. No acute displaced fractures identified. Incidental note of right  pleural effusion and possible edema in the lungs. Heterogeneous nodular enlargement of the thyroid gland with calcification. Consider follow-up with ultrasound in the elective setting. Electronically Signed   By: Burman Nieves M.D.   On: 09/26/2015 04:50   Ct Cervical Spine Wo Contrast  09/26/2015  CLINICAL DATA:  Moderate left occipital headache after a fall. Struck head on a closet door. No loss of consciousness. On Coumadin. EXAM: CT HEAD WITHOUT CONTRAST CT CERVICAL SPINE WITHOUT CONTRAST TECHNIQUE: Multidetector CT imaging of the head and cervical spine was performed following the standard protocol without intravenous contrast. Multiplanar CT image reconstructions of the cervical spine were also generated. COMPARISON:  CT head 04/23/2009.  MRI brain 04/12/2009. FINDINGS: CT HEAD FINDINGS Diffuse cerebral atrophy. Mild ventricular dilatation consistent with central atrophy. Low-attenuation changes throughout the deep white matter consistent with small vessel ischemia. No mass effect or midline shift. No abnormal extra-axial fluid collections. Gray-white matter junctions are distinct. Basal cisterns are not effaced. No evidence of acute intracranial hemorrhage. No depressed skull fractures. Visualized paranasal sinuses and mastoid air cells are not opacified. CT CERVICAL SPINE FINDINGS Normal alignment of the cervical spine. Diffuse degenerative change with narrowed cervical interspaces and endplate hypertrophic changes throughout. Mild degenerative changes in the cervical facet joints. No vertebral compression deformities. No prevertebral soft tissue swelling. No focal bone lesion or bone destruction. C1-2 articulation appears intact. Soft tissues are unremarkable. Diffuse nodular enlargement of the thyroid gland. Calcifications in the right thyroid. Right pleural effusion. Interstitial changes in the lung apices may indicate edema. IMPRESSION: No acute intracranial abnormalities. Chronic atrophy and small  vessel ischemic changes. Normal alignment of the cervical spine. Diffuse degenerative change. No acute displaced fractures identified. Incidental note of right pleural effusion and possible edema in the lungs. Heterogeneous nodular enlargement of the thyroid gland with calcification. Consider follow-up with ultrasound in the elective setting. Electronically Signed   By: Burman Nieves M.D.   On: 09/26/2015 04:50   INR 1 88   ASSESSMENT AND PLAN: 82 YO presenting with diastolic CHF exacerbation, AF with RVR, and possibly superimposed pulmonary infection, likely COPD exacerbation.  1.  Acute on Chronic Diastolic HF-  2. EKG changes: Inferolateral ST depression - Nuclear stress test 06/2013 low risk study. Suspect related to BP and RVR from AF.      3. Atrial Fibrillation -paroxysmal in sinus  ) - Continue coumadin and heparin  Subtherapeutic  4. Acute respiratory infection/COPD flare:on steroids and abx  5. Anemia  Chronic and recurrent ? Cause  desribed as Fe def but ferritin levels normal  7/.  Rx hyperthroidism  TSH normal  8. Orthostatic lightheadedness    Euvolemic. Change diuretics to by mo. This may also help orthostasis uspect most of dyspnea is currently COPD exacerbation We will ask primary cardiology to consider NOAC ambulate  Sherryl Manges, MD

## 2015-10-23 NOTE — Progress Notes (Addendum)
ANTICOAGULATION CONSULT NOTE  Pharmacy Consult for Heparin + Coumadin  Indication: atrial fibrillation  Allergies  Allergen Reactions  . Yellow Dyes (Non-Tartrazine) Nausea And Vomiting    "deathly allergic" No other information given to explain reaction. Per Daughter- this was an IV dye. Ok with yellow coloring on Coumadin tablets.   . Codeine Hives  . Other Other (See Comments)    novocaine broke mouth out  . Penicillins Hives  . Procaine Hives    Patient Measurements: Height:  (157.5 cm) Weight: 124 lb 6.4 oz (56.427 kg) (b scale) IBW/kg (Calculated) : 50.1 Heparin Dosing Weight: 58 kg   Vital Signs: Temp: 97.9 F (36.6 C) (05/29 0625) Temp Source: Oral (05/29 0625) BP: 134/56 mmHg (05/29 0625) Pulse Rate: 57 (05/29 0625)  Labs:  Recent Labs  10/21/15 1056  10/21/15 1628  10/22/15 0301 10/22/15 0629  10/22/15 1226 10/22/15 1408 10/22/15 2005 10/22/15 2030 10/23/15 0311  HGB 9.1*  --   --   --  8.1*  --   --   --   --   --   --  7.9*  HCT 30.9*  --   --   --  27.0*  --   --   --   --   --   --  26.9*  PLT 236  --   --   --  230  --   --   --   --   --   --  222  LABPROT 16.3*  --   --   --   --  20.6*  --   --   --   --   --  21.6*  INR 1.30  --   --   --   --  1.77*  --   --   --   --   --  1.88*  HEPARINUNFRC  --   --   --   < > 0.24*  --   --  0.45  --  0.45  --  0.34  CREATININE 1.73*  --  1.76*  --  1.78*  --   --   --   --   --   --  1.80*  TROPONINI <0.03  < >  --   < > 0.05*  --   < >  --  0.05*  --  0.05* 0.03  < > = values in this interval not displayed.  Estimated Creatinine Clearance: 19.4 mL/min (by C-G formula based on Cr of 1.8).   Medical History: Past Medical History  Diagnosis Date  . Hypertensive heart disease   . Neuropathy (HCC)   . Anxiety   . PONV (postoperative nausea and vomiting)   . Chronic diastolic CHF (congestive heart failure) (HCC)     a. 05/2015 Echo: EF 55-60%, mild LVH, no rwma, mild MR, mildly dil RA, mod TR,  PASP .  Marland Kitchen COPD (chronic obstructive pulmonary disease) (HCC)     a. on home O2 @ 3lpm.  . Family history of adverse reaction to anesthesia   . Type II diabetes mellitus (HCC)   . Macular degeneration, bilateral   . Varicose veins   . GIB (gastrointestinal bleeding)     a. History of GIB.  Marland Kitchen Paroxysmal atrial fibrillation (HCC)     a. CHA2DS2VASc = 6-->coumadin.  . Confusion state   . CKD (chronic kidney disease), stage IV (HCC)   . Elevated troponin     a. H/o elevated trop in setting  of CHF; b. 06/2013 Lexiscan MV: nl EF, mild anterior breast attenuation, no ischemia.     Medications:  Prescriptions prior to admission  Medication Sig Dispense Refill Last Dose  . albuterol (PROVENTIL HFA;VENTOLIN HFA) 108 (90 BASE) MCG/ACT inhaler Inhale 2 puffs into the lungs every 6 (six) hours as needed for wheezing or shortness of breath. 1 Inhaler 3 10/21/2015 at Unknown time  . cholecalciferol (VITAMIN D) 1000 UNITS tablet Take 1,000 Units by mouth every morning.    10/20/2015 at Unknown time  . escitalopram (LEXAPRO) 10 MG tablet Take 10 mg by mouth daily.   10/20/2015 at Unknown time  . fenofibrate 160 MG tablet Take 160 mg by mouth daily.   10/20/2015 at Unknown time  . gabapentin (NEURONTIN) 100 MG capsule Take 1 capsule (100 mg total) by mouth at bedtime. 30 capsule 0 10/20/2015 at Unknown time  . hydrALAZINE (APRESOLINE) 25 MG tablet Take 1 tablet (25 mg total) by mouth every 12 (twelve) hours. 60 tablet 3 10/20/2015 at 7a  . iron polysaccharides (NIFEREX) 150 MG capsule Take 1 capsule (150 mg total) by mouth daily. 30 capsule 3 10/20/2015 at Unknown time  . isosorbide mononitrate (IMDUR) 30 MG 24 hr tablet TAKE 1 TABLET BY MOUTH EVERY DAY 30 tablet 6 10/20/2015 at Unknown time  . methimazole (TAPAZOLE) 5 MG tablet Take 1 tablet (5 mg total) by mouth daily. 30 tablet 3 10/20/2015 at Unknown time  . metoprolol tartrate (LOPRESSOR) 25 MG tablet Take 0.5 tablets (12.5 mg total) by mouth 2 (two)  times daily. 60 tablet 3 10/20/2015 at 8a  . Multiple Vitamin (MULTIVITAMIN WITH MINERALS) TABS tablet Take 1 tablet by mouth daily.   10/20/2015 at Unknown time  . OXYGEN Inhale 3 L into the lungs at bedtime.    Past Week at Unknown time  . potassium chloride SA (K-DUR,KLOR-CON) 20 MEQ tablet Take 20 mEq by mouth daily.   10/20/2015 at Unknown time  . torsemide (DEMADEX) 20 MG tablet Take 2 tablets by mouth in the morning and 1 tablet by mouth at night.   10/20/2015 at Unknown time  . atorvastatin (LIPITOR) 20 MG tablet Take 20 mg by mouth at bedtime.   Taking  . warfarin (COUMADIN) 2.5 MG tablet Take 1 tablet (2.5 mg total) by mouth daily at 6 PM. (Patient taking differently: Take 2.5-3.75 mg by mouth daily at 6 PM. 2.5 mg every day excpet on Tuesday its  3.75 mg) 30 tablet 2 Taking    Assessment: 45 YOF with h/o Afib on Coumadin prior to admission. INR was sub-therapeutic on admission. Pharmacy consulted to resume Coumadin and bridge with heparin. INR on admission is sub-therapeutic at 1.3.   HL 0.34 (therapeutic) on heparin 950 units/hr. INR 1.88, hgb 7.9, plt wnl  Of note, pt is currently on levofloxacin which can elevated INRs  Warfarin PTA dosing: 2.5mg  daily except 3.75mg  Tuesday   Goal of Therapy:  INR 2-3 Heparin level 0.3-0.7 units/ml Monitor platelets by anticoagulation protocol: Yes   Plan:  Continue heparin at 950 units/hr Warfarin 5mg  x 1 tonight Monitor daily HL, INR, CBC and s/s of bleeding    Remi Haggard, PharmD Clinical Pharmacist- Resident Pager: [phone]

## 2015-10-23 NOTE — Evaluation (Signed)
Physical Therapy Evaluation Patient Details Name: Alexis [name] MRN: [id number] DOB: [birth year] Today's Date: 10/23/2015   History of Present Illness  82 y.o. female presenting with Shortness of Breath . PMH is significant for Chronic Hypoxic Respiratory Failure, COPD, Chronic Diastolic Heart Failure, Persistent Atrial Fibrillation, HTN, Anemia, Hyperthyroidism, CKD IV  Clinical Impression  Pt admitted with above diagnosis. Pt currently with functional limitations due to the deficits listed below (see PT Problem List).  Pt ambulated 140' with RW with min/guard for safety. SaO2 87% on RA with walking, she will need supplemental O2 with ambulation. At baseline she uses 3L O2 at night and prn during the day.  Pt will benefit from skilled PT to increase their independence and safety with mobility to allow discharge to the venue listed below.       Follow Up Recommendations Home health PT    Equipment Recommendations  None recommended by PT    Recommendations for Other Services       Precautions / Restrictions Precautions Precautions: Fall Precaution Comments: 2 falls in past year Restrictions Weight Bearing Restrictions: No      Mobility  Bed Mobility Overal bed mobility: Needs Assistance Bed Mobility: Supine to Sit     Supine to sit: Min assist     General bed mobility comments: min A to raise trunk  Transfers Overall transfer level: Needs assistance Equipment used: Rolling walker (2 wheeled) Transfers: Sit to/from Stand Sit to Stand: Min guard         General transfer comment: verbal cues for hand placement, min/guard for safety  Ambulation/Gait Ambulation/Gait assistance: Min guard Ambulation Distance (Feet): 140 Feet Assistive device: Rolling walker (2 wheeled) Gait Pattern/deviations: Decreased stride length   Gait velocity interpretation: at or above normal speed for age/gender General Gait Details: steady with RW, 2/4 dyspnea, SaO2 87% on RA with  ambulation, 93% on RA at rest  Stairs            Wheelchair Mobility    Modified Rankin (Stroke Patients Only)       Balance                                             Pertinent Vitals/Pain Pain Assessment: No/denies pain    Home Living Family/patient expects to be discharged to:: Private residence Living Arrangements: Children Available Help at Discharge: Family;Available 24 hours/day Type of Home: House Home Access: Level entry     Home Layout: One level Home Equipment: Shower seat;Bedside commode;Walker - 4 wheels;Other (comment);Wheelchair - manual (oxygen) Additional Comments: Pt sleeps in recliner    Prior Function Level of Independence: Independent with assistive device(s)         Comments: hx of falls; uses 3L O2 at night and prn during day     Hand Dominance        Extremity/Trunk Assessment   Upper Extremity Assessment: Overall WFL for tasks assessed           Lower Extremity Assessment: RLE deficits/detail;LLE deficits/detail RLE Deficits / Details: strength/ROM WFL LLE Deficits / Details: strength/ROM WFL  Cervical / Trunk Assessment: Kyphotic  Communication   Communication: HOH  Cognition Arousal/Alertness: Awake/alert Behavior During Therapy: WFL for tasks assessed/performed Overall Cognitive Status: Within Functional Limits for tasks assessed  General Comments      Exercises        Assessment/Plan    PT Assessment Patient needs continued PT services  PT Diagnosis Generalized weakness   PT Problem List Decreased activity tolerance;Impaired sensation;Decreased mobility  PT Treatment Interventions Gait training;Therapeutic activities;Therapeutic exercise;Balance training;Patient/family education   PT Goals (Current goals can be found in the Care Plan section) Acute Rehab PT Goals Patient Stated Goal: likes to go to [facility] PT Goal Formulation: With patient/family Time  For Goal Achievement: 11/06/15 Potential to Achieve Goals: Good    Frequency Min 3X/week   Barriers to discharge        Co-evaluation               End of Session Equipment Utilized During Treatment: Gait belt;Oxygen Activity Tolerance: Patient tolerated treatment well Patient left: in chair;with call bell/phone within reach;with chair alarm set Nurse Communication: Mobility status         Time: 1610-9604 PT Time Calculation (min) (ACUTE ONLY): 32 min   Charges:   PT Evaluation $PT Eval Low Complexity: 1 Procedure PT Treatments $Gait Training: 8-22 mins   PT G Codes:        Tamala Ser 10/23/2015, 11:49 AM [phone]

## 2015-10-26 ENCOUNTER — Ambulatory Visit (INDEPENDENT_AMBULATORY_CARE_PROVIDER_SITE_OTHER): Payer: Medicare Other | Admitting: Pharmacist Clinician (PhC)/ Clinical Pharmacy Specialist

## 2015-10-26 DIAGNOSIS — I481 Persistent atrial fibrillation: Secondary | ICD-10-CM | POA: Diagnosis not present

## 2015-10-26 DIAGNOSIS — I480 Paroxysmal atrial fibrillation: Secondary | ICD-10-CM

## 2015-10-26 DIAGNOSIS — I4819 Other persistent atrial fibrillation: Secondary | ICD-10-CM

## 2015-10-26 DIAGNOSIS — Z7901 Long term (current) use of anticoagulants: Secondary | ICD-10-CM | POA: Diagnosis not present

## 2015-10-26 LAB — CULTURE, BLOOD (ROUTINE X 2)
CULTURE: NO GROWTH
Culture: NO GROWTH

## 2015-10-26 LAB — POCT INR: INR: 2.1

## 2015-10-27 ENCOUNTER — Other Ambulatory Visit: Payer: Self-pay

## 2015-10-29 NOTE — Patient Outreach (Signed)
Unsuccessful attempt made to contact patent for transition of care assessment. Will make attempt to contact patient next week.

## 2015-10-30 ENCOUNTER — Other Ambulatory Visit: Payer: Self-pay

## 2015-10-31 ENCOUNTER — Other Ambulatory Visit: Payer: Self-pay

## 2015-10-31 NOTE — Patient Outreach (Signed)
Unsuccessful attempt made to contact patient via telephone via telephone to schedule home visit. Will make another attempt on June 6 to contact patient.

## 2015-10-31 NOTE — Patient Outreach (Signed)
Unsuccessful attempt made to contact patient via telephone at [phone]. HIPPA compliant message left on voice mail with this RNCM's contact information Will make another attempt to contact patient on Wednesday, June 7

## 2015-11-01 ENCOUNTER — Other Ambulatory Visit: Payer: Self-pay

## 2015-11-01 NOTE — Patient Outreach (Signed)
Successful attempt made to contact patient via telephone for community care coordination. Patient identified herself by providing date of birth and address. This RNCM provided purpose of call and brief description of [facility] Community Care Coordination Program.  Patient and this RNCM made an appointment for initial home visit next week.

## 2015-11-04 ENCOUNTER — Emergency Department (HOSPITAL_COMMUNITY): Payer: Medicare Other

## 2015-11-04 ENCOUNTER — Encounter (HOSPITAL_COMMUNITY): Payer: Self-pay

## 2015-11-04 ENCOUNTER — Emergency Department (HOSPITAL_COMMUNITY)
Admission: EM | Admit: 2015-11-04 | Discharge: 2015-11-04 | Disposition: A | Discharge status: Home / Self Care | Payer: Medicare Other | Attending: Emergency Medicine | Admitting: Emergency Medicine

## 2015-11-04 ENCOUNTER — Other Ambulatory Visit: Payer: Self-pay | Admitting: Internal Medicine

## 2015-11-04 DIAGNOSIS — Y92000 Kitchen of unspecified non-institutional (private) residence as  the place of occurrence of the external cause: Secondary | ICD-10-CM | POA: Insufficient documentation

## 2015-11-04 DIAGNOSIS — I480 Paroxysmal atrial fibrillation: Secondary | ICD-10-CM | POA: Diagnosis not present

## 2015-11-04 DIAGNOSIS — W010XXA Fall on same level from slipping, tripping and stumbling without subsequent striking against object, initial encounter: Secondary | ICD-10-CM | POA: Insufficient documentation

## 2015-11-04 DIAGNOSIS — S0003XA Contusion of scalp, initial encounter: Secondary | ICD-10-CM | POA: Diagnosis not present

## 2015-11-04 DIAGNOSIS — I130 Hypertensive heart and chronic kidney disease with heart failure and stage 1 through stage 4 chronic kidney disease, or unspecified chronic kidney disease: Secondary | ICD-10-CM | POA: Diagnosis not present

## 2015-11-04 DIAGNOSIS — Z7722 Contact with and (suspected) exposure to environmental tobacco smoke (acute) (chronic): Secondary | ICD-10-CM | POA: Diagnosis not present

## 2015-11-04 DIAGNOSIS — E1140 Type 2 diabetes mellitus with diabetic neuropathy, unspecified: Secondary | ICD-10-CM | POA: Diagnosis not present

## 2015-11-04 DIAGNOSIS — Z7901 Long term (current) use of anticoagulants: Secondary | ICD-10-CM | POA: Diagnosis not present

## 2015-11-04 DIAGNOSIS — Z79899 Other long term (current) drug therapy: Secondary | ICD-10-CM | POA: Diagnosis not present

## 2015-11-04 DIAGNOSIS — Y999 Unspecified external cause status: Secondary | ICD-10-CM | POA: Diagnosis not present

## 2015-11-04 DIAGNOSIS — W19XXXA Unspecified fall, initial encounter: Secondary | ICD-10-CM

## 2015-11-04 DIAGNOSIS — I5032 Chronic diastolic (congestive) heart failure: Secondary | ICD-10-CM | POA: Insufficient documentation

## 2015-11-04 DIAGNOSIS — N184 Chronic kidney disease, stage 4 (severe): Secondary | ICD-10-CM | POA: Insufficient documentation

## 2015-11-04 DIAGNOSIS — Y939 Activity, unspecified: Secondary | ICD-10-CM | POA: Insufficient documentation

## 2015-11-04 DIAGNOSIS — J449 Chronic obstructive pulmonary disease, unspecified: Secondary | ICD-10-CM | POA: Diagnosis not present

## 2015-11-04 DIAGNOSIS — S0083XA Contusion of other part of head, initial encounter: Secondary | ICD-10-CM | POA: Diagnosis present

## 2015-11-04 LAB — PROTIME-INR
INR: 1.23 (ref 0.00–1.49)
Prothrombin Time: 15.7 seconds — ABNORMAL HIGH (ref 11.6–15.2)

## 2015-11-04 MED ORDER — ACETAMINOPHEN 325 MG PO TABS
650.0000 mg | ORAL_TABLET | Freq: Once | ORAL | Status: AC
  Administered 2015-11-04: 650 mg via ORAL
  Filled 2015-11-04: qty 2

## 2015-11-04 NOTE — ED Notes (Signed)
Pt transported to xray/CT

## 2015-11-04 NOTE — ED Notes (Signed)
Patient transported to CT 

## 2015-11-04 NOTE — ED Provider Notes (Signed)
CSN: [id number]     Arrival date & time 11/04/15  1700 History   First MD Initiated Contact with Patient 11/04/15 1722     Chief Complaint  Patient presents with  . Fall   HPI Pt from home. Pt fell backwards in kitchen after losing balance. Pt hit posterior head. No LOC. No nausea or vomiting. No weakness or numbness. Small hematoma to posterior head. Right lower scapula pain as well. Patient takes Coumadin home. States pain is moderate. She has not taken anything for the pain. Patient able to ambulate after the event with help. Paramedics arrived shortly after her fall. She was in her usual state of health prior to this and otherwise feels well.  Past Medical History  Diagnosis Date  . Hypertensive heart disease   . Neuropathy (HCC)   . Anxiety   . PONV (postoperative nausea and vomiting)   . Chronic diastolic CHF (congestive heart failure) (HCC)     a. 05/2015 Echo: EF 55-60%, mild LVH, no rwma, mild MR, mildly dil RA, mod TR, PASP .  Marland Kitchen COPD (chronic obstructive pulmonary disease) (HCC)     a. on home O2 @ 3lpm.  . Family history of adverse reaction to anesthesia   . Type II diabetes mellitus (HCC)   . Macular degeneration, bilateral   . Varicose veins   . GIB (gastrointestinal bleeding)     a. History of GIB.  Marland Kitchen Paroxysmal atrial fibrillation (HCC)     a. CHA2DS2VASc = 6-->coumadin.  . Confusion state   . CKD (chronic kidney disease), stage IV (HCC)   . Elevated troponin     a. H/o elevated trop in setting of CHF; b. 06/2013 Lexiscan MV: nl EF, mild anterior breast attenuation, no ischemia.    Past Surgical History  Procedure Laterality Date  . Hip fracture surgery  2004  . Abdominal hysterectomy    . Vein surgery    . Esophagogastroduodenoscopy N/A 12/01/2014    Procedure: ESOPHAGOGASTRODUODENOSCOPY (EGD);  Surgeon: Hilarie Fredrickson, MD;  Location: [facility] ENDOSCOPY;  Service: Endoscopy;  Laterality: N/A;  . Cardioversion N/A 09/22/2015    Procedure: CARDIOVERSION;  Surgeon:  Chrystie Nose, MD;  Location: [facility] ENDOSCOPY;  Service: Cardiovascular;  Laterality: N/A;   Family History  Problem Relation Age of Onset  . Colon cancer Father 63    advanced when discovered, no surgery or therapy.  this led to his death  . Breast cancer Sister   . Cancer Brother   . Breast cancer Mother   . Lung cancer Brother   . Thyroid disease Neg Hx   . Heart attack Neg Hx   . Hypertension Mother    Social History  Substance Use Topics  . Smoking status: Passive Smoke Exposure - Never Smoker  . Smokeless tobacco: Never Used  . Alcohol Use: No   OB History    No data available     Review of Systems  HENT: Negative for dental problem.   Respiratory: Negative for shortness of breath.   Cardiovascular: Negative for chest pain.  Genitourinary: Negative for dysuria.  Skin: Negative for wound.  Neurological: Negative for light-headedness.  All other systems reviewed and are negative.     Allergies  Yellow dyes (non-tartrazine); Codeine; Other; Penicillins; and Procaine  Home Medications   Prior to Admission medications   Medication Sig Start Date End Date Taking? Authorizing Provider  albuterol (PROVENTIL HFA;VENTOLIN HFA) 108 (90 BASE) MCG/ACT inhaler Inhale 2 puffs into the lungs every 6 (six)  hours as needed for wheezing or shortness of breath. 06/11/13   Nishant Dhungel, MD  atorvastatin (LIPITOR) 20 MG tablet Take 20 mg by mouth at bedtime. 10/12/14 10/12/15  Historical Provider, MD  cholecalciferol (VITAMIN D) 1000 UNITS tablet Take 1,000 Units by mouth every morning.     Historical Provider, MD  escitalopram (LEXAPRO) 10 MG tablet Take 10 mg by mouth daily.    Historical Provider, MD  fenofibrate 160 MG tablet Take 160 mg by mouth daily.    Historical Provider, MD  gabapentin (NEURONTIN) 100 MG capsule Take 1 capsule (100 mg total) by mouth at bedtime. 08/05/15   Rodolph Bong, MD  hydrALAZINE (APRESOLINE) 25 MG tablet Take 1 tablet (25 mg total) by mouth every  12 (twelve) hours. 09/22/15   Ripudeep Jenna Luo, MD  iron polysaccharides (NIFEREX) 150 MG capsule Take 1 capsule (150 mg total) by mouth daily. 09/22/15   Ripudeep Jenna Luo, MD  isosorbide mononitrate (IMDUR) 30 MG 24 hr tablet TAKE 1 TABLET BY MOUTH EVERY DAY 01/31/15   Chrystie Nose, MD  levofloxacin (LEVAQUIN) 500 MG tablet Take 1 tablet (500 mg total) by mouth every other day. Please take one tablet on 5/30 and one tablet on 6/1. 10/23/15   Campbell Stall, MD  methimazole (TAPAZOLE) 5 MG tablet Take 1 tablet (5 mg total) by mouth daily. 09/22/15   Ripudeep Jenna Luo, MD  metoprolol tartrate (LOPRESSOR) 25 MG tablet Take 1 tablet (25 mg total) by mouth 2 (two) times daily. 10/23/15   Campbell Stall, MD  Multiple Vitamin (MULTIVITAMIN WITH MINERALS) TABS tablet Take 1 tablet by mouth daily.    Historical Provider, MD  OXYGEN Inhale 3 L into the lungs at bedtime.     Historical Provider, MD  potassium chloride SA (K-DUR,KLOR-CON) 20 MEQ tablet Take 20 mEq by mouth daily.    Historical Provider, MD  predniSONE (DELTASONE) 20 MG tablet Take 2 tablets (40 mg total) by mouth daily with breakfast. 10/23/15   Campbell Stall, MD  torsemide (DEMADEX) 20 MG tablet Take 2 tablets (40 mg total) by mouth daily. 10/24/15   Campbell Stall, MD  warfarin (COUMADIN) 2.5 MG tablet Take 1.5 tablets (3.75 mg total) by mouth daily at 6 PM. 10/23/15   Campbell Stall, MD   BP 161/48 mmHg  Pulse 61  Temp(Src) 98.4 F (36.9 C) (Oral)  Resp 16  SpO2 95% Physical Exam  Constitutional: She is oriented to person, place, and time. She appears well-developed and well-nourished. No distress.  HENT:  Head: Normocephalic.  Small hematoma on left scalp  Eyes: Conjunctivae are normal. Right eye exhibits no discharge. Left eye exhibits no discharge.  Neck: Normal range of motion. Neck supple.  Cardiovascular: Normal rate, regular rhythm, normal heart sounds and intact distal pulses.   Pulmonary/Chest: Effort normal and breath sounds normal.  No respiratory distress. She exhibits no tenderness.  Abdominal: Soft. Bowel sounds are normal. She exhibits no distension and no mass. There is no tenderness. There is no rebound and no guarding.  Musculoskeletal: She exhibits tenderness (musteoskeletal exam significant for scapular pain along the right scapula. Patient has full range of motion of shoulders, elbows, wrists, pelvis, knees, ankles. She has no point tenderness along the extremities or C-spine, T-spine, L-spine. ). She exhibits no edema.  Tender at posterior right scapula No tenderness along the lower extremities or pelvis.  Neurological: She is alert and oriented to person, place, and time.  5 out of  5 strength in all 4 extremities with normal sensation  Skin: Skin is warm. No rash noted.  Psychiatric: She has a normal mood and affect.  Nursing note and vitals reviewed.   ED Course  Procedures (including critical care time) Labs Review Labs Reviewed  PROTIME-INR - Abnormal; Notable for the following:    Prothrombin Time 15.7 (*)    All other components within normal limits    Imaging Review Dg Chest 2 View  11/04/2015  CLINICAL DATA:  Fall today back pain EXAM: CHEST  2 VIEW COMPARISON:  10/21/2015 FINDINGS: Stable cardiac enlargement and vascular congestion. No consolidation effusion or pneumothorax. No acute musculoskeletal findings. IMPRESSION: No active cardiopulmonary disease. Electronically Signed   By: Esperanza Heir M.D.   On: 11/04/2015 19:22   Ct Head Wo Contrast  11/04/2015  CLINICAL DATA:  82 year old female with fall and head trauma. EXAM: CT HEAD WITHOUT CONTRAST TECHNIQUE: Contiguous axial images were obtained from the base of the skull through the vertex without intravenous contrast. COMPARISON:  CT dated 09/26/2015 FINDINGS: There is mild age-related atrophy and chronic microvascular ischemic changes. There is no acute intracranial hemorrhage. No mass effect or midline shift noted. The visualized paranasal  sinuses and mastoid air cells are clear. The calvarium is intact with IMPRESSION: No acute intracranial hemorrhage. Electronically Signed   By: Elgie Collard M.D.   On: 11/04/2015 19:59   I have personally reviewed and evaluated these images and lab results as part of my medical decision-making.   EKG Interpretation None      MDM   Final diagnoses:  Fall, initial encounter    Doubt syncope given mechanical history. Chest x-ray without rib fractures, scapular fracture. CT head without subdural hematoma or skull fracture. Patient is otherwise well and able to ambulate. Pain is controlled after analgesia. No C-spine tenderness and no weakness or numbness. Patient's INR is subtherapeutic. Instructed to take an extra dose tonight and follow up with her primary care provider in 2 days for repeat labs. Patient instructed to return for any new focal deficits, intractable vomiting, seizures, or other concerning symptoms for an emergency. Patient verbalized understanding and agreement with plan.    Sidney Ace, MD 11/05/15 0040  Alvira Monday, MD 11/05/15 1311

## 2015-11-04 NOTE — Discharge Instructions (Signed)
Take an extra dose of your coumadin today and then resume your usual dose tomorrow. Follow up with your pcp in two days to re-check.

## 2015-11-04 NOTE — ED Notes (Signed)
Per EMS - pt from home. Pt fell backwards in kitchen after losing balance. Pt hit posterior head. Small hematoma to posterior head. Right lower scapula pain, worse w/ palpation. Denies neck/back pain. A&O x 4. Pt takes bloodthinner.

## 2015-11-04 NOTE — ED Notes (Signed)
Pt back in room.

## 2015-11-06 NOTE — Telephone Encounter (Signed)
New Message   If Home Health RN is calling please get Coumadin Nurse on the phone STAT  1.  Are you calling in regards to an appointment? yes  2.  Are you calling for a refill ? No  3.  Are you having bleeding issues? No   4.  Do you need clearance to hold Coumadin? No    Pt daughter stated pt recently had a hospital visit and while there coumadin was low. Pt daughter wants to speak with RN to see if pt needs to be seen earlier that scheduled appt 6/19. Please call back to discuss.

## 2015-11-06 NOTE — Telephone Encounter (Signed)
Returned call to pt daughter about low INR. She states she was told to have pt take 2 tablets the day she left the ER and call our office on Monday. Changed her apt to be checked this week instead of next week.

## 2015-11-06 NOTE — Telephone Encounter (Signed)
Rx request sent to pharmacy.  

## 2015-11-08 ENCOUNTER — Ambulatory Visit (INDEPENDENT_AMBULATORY_CARE_PROVIDER_SITE_OTHER): Payer: Medicare Other | Admitting: Pharmacist

## 2015-11-08 ENCOUNTER — Ambulatory Visit: Payer: Self-pay

## 2015-11-08 DIAGNOSIS — I4819 Other persistent atrial fibrillation: Secondary | ICD-10-CM

## 2015-11-08 DIAGNOSIS — I481 Persistent atrial fibrillation: Secondary | ICD-10-CM | POA: Diagnosis not present

## 2015-11-08 DIAGNOSIS — Z7901 Long term (current) use of anticoagulants: Secondary | ICD-10-CM

## 2015-11-08 DIAGNOSIS — I480 Paroxysmal atrial fibrillation: Secondary | ICD-10-CM | POA: Diagnosis not present

## 2015-11-08 LAB — POCT INR: INR: 1.3

## 2015-11-08 NOTE — Addendum Note (Signed)
Addended by: Nigel Mormon R on: 11/08/2015 01:51 PM   Modules accepted: Medications

## 2015-11-13 ENCOUNTER — Ambulatory Visit (INDEPENDENT_AMBULATORY_CARE_PROVIDER_SITE_OTHER): Payer: Medicare Other | Admitting: Pharmacist

## 2015-11-13 DIAGNOSIS — I481 Persistent atrial fibrillation: Secondary | ICD-10-CM

## 2015-11-13 DIAGNOSIS — I480 Paroxysmal atrial fibrillation: Secondary | ICD-10-CM | POA: Diagnosis not present

## 2015-11-13 DIAGNOSIS — Z7901 Long term (current) use of anticoagulants: Secondary | ICD-10-CM | POA: Diagnosis not present

## 2015-11-13 DIAGNOSIS — I4819 Other persistent atrial fibrillation: Secondary | ICD-10-CM

## 2015-11-13 LAB — POCT INR: INR: 1.4

## 2015-11-13 MED ORDER — WARFARIN SODIUM 2.5 MG PO TABS: 3.7500 mg | ORAL_TABLET | Freq: Every day | ORAL | Status: DC

## 2015-11-15 ENCOUNTER — Other Ambulatory Visit: Payer: Self-pay

## 2015-11-16 NOTE — Patient Outreach (Signed)
[facility]) Care Management  11/16/2015  Alexis [name] 1932-04-06 [id number]   Arrived at patient's home for scheduled home visit. Patient was agreeable to a short visit due to her son bringing her lunch.   Patient states that she had blood work drawn last week and her Coumadin dose has been increased for a few days with plans to return to normal dose tomorrow. Patient states she has had no other changes to her medications since last visit. Patient is unable to cite all of her medications, but has a current medication list. Stated that her daughter fills her pill box for her and she takes them as she is supposed to.   Patient currently denies any shortness of breath, sudden weight gain or swelling, weakness, nausea or coughing/wheezing.   Patient has a set of working scales. Currently not weighing every day. Encouraged to monitor weight daily and educated regarding the importance of observing for weight gain.   Patient stated she is aware of the heart failure action plan, but is unable to describe specifically what to look for. Educated patient regarding monitoring for chest pain, weight gain if 3 lbs in 1 day or 5 lbs in a week, difficulty breathing, cough, or increased swelling in hands, feet or ankles. Patient verbalized understanding.  Patient is able to discuss the COPD action plan and verbalize when to call the doctor/911.   Stated that she has a "breathing machine" for her breathing and she uses it as needed when she is wheezing or feeling short of breath. Stated she uses the nebulizer or inhaler approximately once or twice a week.   Patient stated that she would like education regarding her diabetes and diet.  Plan: Will follow up with patient in a week to provide continued education regarding heart failure and diabetes.  RNCM contact information provided and encouraged to call with any questions or concerns.  Turkey R. Jasara Corrigan, RN, BSN, CCM [facility] Care  Management Coordinator [phone]

## 2015-11-20 ENCOUNTER — Ambulatory Visit: Payer: Medicare Other | Admitting: Internal Medicine

## 2015-11-21 ENCOUNTER — Other Ambulatory Visit: Payer: Self-pay | Admitting: Internal Medicine

## 2015-11-21 ENCOUNTER — Ambulatory Visit (INDEPENDENT_AMBULATORY_CARE_PROVIDER_SITE_OTHER): Payer: Medicare Other | Admitting: Pharmacist

## 2015-11-21 DIAGNOSIS — I480 Paroxysmal atrial fibrillation: Secondary | ICD-10-CM | POA: Diagnosis not present

## 2015-11-21 DIAGNOSIS — Z7901 Long term (current) use of anticoagulants: Secondary | ICD-10-CM

## 2015-11-21 DIAGNOSIS — I481 Persistent atrial fibrillation: Secondary | ICD-10-CM

## 2015-11-21 DIAGNOSIS — I4819 Other persistent atrial fibrillation: Secondary | ICD-10-CM

## 2015-11-21 LAB — POCT INR: INR: 1.3

## 2015-11-22 ENCOUNTER — Other Ambulatory Visit: Payer: Self-pay

## 2015-11-23 ENCOUNTER — Telehealth: Payer: Self-pay | Admitting: Internal Medicine

## 2015-11-23 MED ORDER — TORSEMIDE 20 MG PO TABS: 40.0000 mg | ORAL_TABLET | Freq: Every day | ORAL | Status: DC

## 2015-11-23 NOTE — Telephone Encounter (Signed)
Spoke with granddaughter. Wants to know if patient is to be taking torsemide. Advised that was on med list at hospital discharge on 5/29. Rx(s) sent to pharmacy electronically.

## 2015-11-23 NOTE — Telephone Encounter (Signed)
New message      Pt c/o medication issue:  1. Name of Medication: Torsemide  2. How are you currently taking this medication (dosage and times per day)? Grand-daughter uncertain  3. Are you having a reaction (difficulty breathing--STAT)? no  4. What is your medication issue? The grand-daughter wants to make the pt is suppose to be taking the medications

## 2015-11-23 NOTE — Patient Outreach (Signed)
[facility]) Care Management   11/22/2015  Alexis [name] 1932/01/23 [id number]   Home visit completed with patient. Patient has no complaints at present. Stated that she is feeling better since her hospitalization. Denies any shortness of breath or pain.  Patient continues to weigh daily. Her weight today is 127 lbs with no weight gain noted. No swelling reported in her hands or feet.   Patient doe have some exposure in the home to second hand smoke.   Provided education regarding diabetic diet and low sodium. Stated that she had tried some supplements at one time that had raised her blood sugar. RNCM went over labels with patient for Glucerna and Enterex (for Diabetes) that she had available in the home. Educated patient that both were appropriate for diabetes, but to watch the carbohydrate count, especially if she is having food with the shakes. The Enterex had 25 carbohydrates while the Glucerna had 15 grams.  Patient verbalized understanding. Patient stated that she does not use any added salt and educated on how to decrease salt intake with foods she is normally eating at home, such as rinsing canned foods and avoiding sauces and creams. Patient verbalized understanding of directions.   Plan:  Continue to provide safety education due to fall history and heart failure education.  Will follow up with diet education next week.  Alexis R. Maxie Slovacek, RN, BSN, CCM [facility] Care Management Coordinator [phone]

## 2015-11-27 ENCOUNTER — Other Ambulatory Visit: Payer: Self-pay

## 2015-11-27 NOTE — Patient Outreach (Signed)
[facility]) Care Management [facility] Community CM Telephone Outreach Attempt  11/27/2015  ZHOEY DRAKES March 07, 1932 [id number]  Outreach attempt made to patient at home number [phone] with no answer. Left HIPAA compliant voicemail and provided RNCM contact info. Invited call back.  Turkey R. Rebel Willcutt, RN, BSN, CCM [facility] Care Management Coordinator [phone]

## 2015-11-29 ENCOUNTER — Ambulatory Visit (INDEPENDENT_AMBULATORY_CARE_PROVIDER_SITE_OTHER): Payer: Medicare Other | Admitting: Pharmacist Clinician (PhC)/ Clinical Pharmacy Specialist

## 2015-11-29 DIAGNOSIS — I4819 Other persistent atrial fibrillation: Secondary | ICD-10-CM

## 2015-11-29 DIAGNOSIS — I481 Persistent atrial fibrillation: Secondary | ICD-10-CM

## 2015-11-29 DIAGNOSIS — I480 Paroxysmal atrial fibrillation: Secondary | ICD-10-CM | POA: Diagnosis not present

## 2015-11-29 DIAGNOSIS — Z7901 Long term (current) use of anticoagulants: Secondary | ICD-10-CM | POA: Diagnosis not present

## 2015-11-29 LAB — POCT INR: INR: 1.2

## 2015-12-08 ENCOUNTER — Other Ambulatory Visit: Payer: Self-pay

## 2015-12-08 ENCOUNTER — Ambulatory Visit (INDEPENDENT_AMBULATORY_CARE_PROVIDER_SITE_OTHER): Payer: Medicare Other | Admitting: Pharmacist Clinician (PhC)/ Clinical Pharmacy Specialist

## 2015-12-08 DIAGNOSIS — I480 Paroxysmal atrial fibrillation: Secondary | ICD-10-CM | POA: Diagnosis not present

## 2015-12-08 DIAGNOSIS — I4819 Other persistent atrial fibrillation: Secondary | ICD-10-CM

## 2015-12-08 DIAGNOSIS — Z7901 Long term (current) use of anticoagulants: Secondary | ICD-10-CM

## 2015-12-08 DIAGNOSIS — I481 Persistent atrial fibrillation: Secondary | ICD-10-CM

## 2015-12-08 LAB — POCT INR: INR: 1.4

## 2015-12-08 MED ORDER — WARFARIN SODIUM 4 MG PO TABS: ORAL_TABLET | ORAL | Status: DC

## 2015-12-08 NOTE — Patient Outreach (Signed)
RNCM was successful in making contact with patient for community care coordination. Patient and this RNCM collaborated to form her case management care plan.  Purpose of call stated.  Patient advised her assigned case manager is currently on vacation.    Patient reports having an appointment to to have her INR checked today. Patient further reported the INR results were low and her coumadin was adjusted up from 5 mg per day to 9 mg per day.    Patient reports having an appointment next week for re-evaluation. RNCM advised patient her assigned case manager would be returning next week.

## 2015-12-10 ENCOUNTER — Emergency Department (HOSPITAL_COMMUNITY): Payer: Medicare Other

## 2015-12-10 ENCOUNTER — Encounter (HOSPITAL_COMMUNITY): Payer: Self-pay | Admitting: *Deleted

## 2015-12-10 ENCOUNTER — Emergency Department (HOSPITAL_COMMUNITY)
Admission: EM | Admit: 2015-12-10 | Discharge: 2015-12-10 | Disposition: A | Discharge status: Home / Self Care | Payer: Medicare Other | Attending: Emergency Medicine | Admitting: Emergency Medicine

## 2015-12-10 DIAGNOSIS — Y939 Activity, unspecified: Secondary | ICD-10-CM | POA: Diagnosis not present

## 2015-12-10 DIAGNOSIS — W19XXXA Unspecified fall, initial encounter: Secondary | ICD-10-CM

## 2015-12-10 DIAGNOSIS — Z7901 Long term (current) use of anticoagulants: Secondary | ICD-10-CM | POA: Insufficient documentation

## 2015-12-10 DIAGNOSIS — Z7722 Contact with and (suspected) exposure to environmental tobacco smoke (acute) (chronic): Secondary | ICD-10-CM | POA: Diagnosis not present

## 2015-12-10 DIAGNOSIS — S0083XA Contusion of other part of head, initial encounter: Secondary | ICD-10-CM | POA: Insufficient documentation

## 2015-12-10 DIAGNOSIS — S60222A Contusion of left hand, initial encounter: Secondary | ICD-10-CM | POA: Insufficient documentation

## 2015-12-10 DIAGNOSIS — Y9289 Other specified places as the place of occurrence of the external cause: Secondary | ICD-10-CM | POA: Diagnosis not present

## 2015-12-10 DIAGNOSIS — E119 Type 2 diabetes mellitus without complications: Secondary | ICD-10-CM | POA: Insufficient documentation

## 2015-12-10 DIAGNOSIS — S0993XA Unspecified injury of face, initial encounter: Secondary | ICD-10-CM | POA: Diagnosis present

## 2015-12-10 DIAGNOSIS — S80212A Abrasion, left knee, initial encounter: Secondary | ICD-10-CM | POA: Insufficient documentation

## 2015-12-10 DIAGNOSIS — I5032 Chronic diastolic (congestive) heart failure: Secondary | ICD-10-CM | POA: Diagnosis not present

## 2015-12-10 DIAGNOSIS — N184 Chronic kidney disease, stage 4 (severe): Secondary | ICD-10-CM | POA: Diagnosis not present

## 2015-12-10 DIAGNOSIS — I130 Hypertensive heart and chronic kidney disease with heart failure and stage 1 through stage 4 chronic kidney disease, or unspecified chronic kidney disease: Secondary | ICD-10-CM | POA: Diagnosis not present

## 2015-12-10 DIAGNOSIS — W010XXA Fall on same level from slipping, tripping and stumbling without subsequent striking against object, initial encounter: Secondary | ICD-10-CM | POA: Insufficient documentation

## 2015-12-10 DIAGNOSIS — R51 Headache: Secondary | ICD-10-CM | POA: Diagnosis not present

## 2015-12-10 DIAGNOSIS — S0990XA Unspecified injury of head, initial encounter: Secondary | ICD-10-CM

## 2015-12-10 DIAGNOSIS — Y999 Unspecified external cause status: Secondary | ICD-10-CM | POA: Diagnosis not present

## 2015-12-10 LAB — PROTIME-INR
INR: 1.41 (ref 0.00–1.49)
Prothrombin Time: 17.4 seconds — ABNORMAL HIGH (ref 11.6–15.2)

## 2015-12-10 MED ORDER — ACETAMINOPHEN 325 MG PO TABS: 650.0000 mg | ORAL_TABLET | Freq: Once | ORAL | Status: DC

## 2015-12-10 NOTE — ED Provider Notes (Signed)
CSN: [id number]     Arrival date & time 12/10/15  1316 History   First MD Initiated Contact with Patient 12/10/15 1345     Chief Complaint  Patient presents with  . Fall     (Consider location/radiation/quality/duration/timing/severity/associated sxs/prior Treatment) Patient is a 82 y.o. female presenting with fall. The history is provided by the patient and a relative.  Fall Associated symptoms include headaches. Pertinent negatives include no abdominal pain and no shortness of breath.   Patient stumbled on her back porch. Hitting the left side of her for head. Also landing on her left knee and bruising her left hand. No loss of consciousness no syncope. Patient states the tetanus shot is up-to-date. Patient is on Coumadin but has been subtherapeutic. Her doctors are slowly increasing her Coumadin and checking it once a week. Patient denies any neck pain low back pain and hip pain. States she is able to move her left hand fine and able to move her left knee area fine. Past Medical History  Diagnosis Date  . Hypertensive heart disease   . Neuropathy (HCC)   . Anxiety   . PONV (postoperative nausea and vomiting)   . Chronic diastolic CHF (congestive heart failure) (HCC)     a. 05/2015 Echo: EF 55-60%, mild LVH, no rwma, mild MR, mildly dil RA, mod TR, PASP .  Marland Kitchen COPD (chronic obstructive pulmonary disease) (HCC)     a. on home O2 @ 3lpm.  . Family history of adverse reaction to anesthesia   . Type II diabetes mellitus (HCC)   . Macular degeneration, bilateral   . Varicose veins   . GIB (gastrointestinal bleeding)     a. History of GIB.  Marland Kitchen Paroxysmal atrial fibrillation (HCC)     a. CHA2DS2VASc = 6-->coumadin.  . Confusion state   . CKD (chronic kidney disease), stage IV (HCC)   . Elevated troponin     a. H/o elevated trop in setting of CHF; b. 06/2013 Lexiscan MV: nl EF, mild anterior breast attenuation, no ischemia.    Past Surgical History  Procedure Laterality Date  . Hip  fracture surgery  2004  . Abdominal hysterectomy    . Vein surgery    . Esophagogastroduodenoscopy N/A 12/01/2014    Procedure: ESOPHAGOGASTRODUODENOSCOPY (EGD);  Surgeon: Hilarie Fredrickson, MD;  Location: [facility] ENDOSCOPY;  Service: Endoscopy;  Laterality: N/A;  . Cardioversion N/A 09/22/2015    Procedure: CARDIOVERSION;  Surgeon: Chrystie Nose, MD;  Location: [facility] ENDOSCOPY;  Service: Cardiovascular;  Laterality: N/A;   Family History  Problem Relation Age of Onset  . Colon cancer Father 49    advanced when discovered, no surgery or therapy.  this led to his death  . Breast cancer Sister   . Cancer Brother   . Breast cancer Mother   . Lung cancer Brother   . Thyroid disease Neg Hx   . Heart attack Neg Hx   . Hypertension Mother    Social History  Substance Use Topics  . Smoking status: Passive Smoke Exposure - Never Smoker  . Smokeless tobacco: Never Used  . Alcohol Use: No   OB History    No data available     Review of Systems  Constitutional: Negative for fever.  HENT: Negative for congestion.   Eyes: Negative for visual disturbance.  Respiratory: Negative for shortness of breath.   Gastrointestinal: Negative for abdominal pain.  Genitourinary: Negative for hematuria.  Musculoskeletal: Negative for back pain and neck pain.  Skin: Positive  for wound.  Neurological: Positive for headaches. Negative for syncope.  Hematological: Bruises/bleeds easily.  Psychiatric/Behavioral: Negative for confusion.      Allergies  Yellow dyes (non-tartrazine); Codeine; Other; Penicillins; and Procaine  Home Medications   Prior to Admission medications   Medication Sig Start Date End Date Taking? Authorizing Provider  albuterol (PROVENTIL HFA;VENTOLIN HFA) 108 (90 BASE) MCG/ACT inhaler Inhale 2 puffs into the lungs every 6 (six) hours as needed for wheezing or shortness of breath. 06/11/13  Yes Nishant Dhungel, MD  atorvastatin (LIPITOR) 20 MG tablet Take 20 mg by mouth daily.   Yes  Historical Provider, MD  cholecalciferol (VITAMIN D) 1000 UNITS tablet Take 1,000 Units by mouth every morning.    Yes Historical Provider, MD  docusate sodium (COLACE) 100 MG capsule Take 100 mg by mouth 2 (two) times daily as needed for mild constipation.   Yes Historical Provider, MD  escitalopram (LEXAPRO) 10 MG tablet Take 10 mg by mouth daily.   Yes Historical Provider, MD  fenofibrate 160 MG tablet Take 160 mg by mouth daily.   Yes Historical Provider, MD  gabapentin (NEURONTIN) 100 MG capsule Take 1 capsule (100 mg total) by mouth at bedtime. 08/05/15  Yes Rodolph Bong, MD  hydrALAZINE (APRESOLINE) 25 MG tablet Take 1 tablet (25 mg total) by mouth every 12 (twelve) hours. 09/22/15  Yes Ripudeep Jenna Luo, MD  iron polysaccharides (NIFEREX) 150 MG capsule Take 1 capsule (150 mg total) by mouth daily. 09/22/15  Yes Ripudeep Jenna Luo, MD  isosorbide mononitrate (IMDUR) 30 MG 24 hr tablet TAKE 1 TABLET BY MOUTH EVERY DAY 11/06/15  Yes Chrystie Nose, MD  metoprolol tartrate (LOPRESSOR) 25 MG tablet Take 1 tablet (25 mg total) by mouth 2 (two) times daily. 10/23/15  Yes Campbell Stall, MD  Multiple Vitamin (MULTIVITAMIN WITH MINERALS) TABS tablet Take 1 tablet by mouth daily.   Yes Historical Provider, MD  OXYGEN Inhale 3 L into the lungs at bedtime.    Yes Historical Provider, MD  polyethylene glycol (MIRALAX / GLYCOLAX) packet Take 17 g by mouth daily as needed for mild constipation.   Yes Historical Provider, MD  potassium chloride SA (K-DUR,KLOR-CON) 20 MEQ tablet Take 20 mEq by mouth daily.   Yes Historical Provider, MD  torsemide (DEMADEX) 20 MG tablet Take 2 tablets (40 mg total) by mouth daily. 11/23/15  Yes Chrystie Nose, MD  warfarin (COUMADIN) 4 MG tablet Take 1-1.5 tablets by mouth daily as directed by coumadin clinic Patient taking differently: Take 4 mg by mouth daily. by mouth daily as directed by coumadin clinic 12/08/15  Yes Chrystie Nose, MD   BP 171/60 mmHg  Pulse 70  Temp(Src)  98 F (36.7 C) (Oral)  Resp 16  SpO2 91% Physical Exam  Constitutional: She is oriented to person, place, and time. She appears well-developed and well-nourished. No distress.  HENT:  Head: Normocephalic.  Mouth/Throat: Oropharynx is clear and moist.  Significant hematoma to the left for head measuring about 3 x 5 cm. Swelling to the upper eyelid. Abrasions to the left for head no laceration. Ecchymosis below left eye and a little bit below right eye. No nosebleed. Oropharynx is clear. Mucous membranes moist.  Eyes: Conjunctivae and EOM are normal. Pupils are equal, round, and reactive to light.  Neck: Normal range of motion. Neck supple.  Cardiovascular: Normal rate, regular rhythm and normal heart sounds.   No murmur heard. Pulmonary/Chest: Effort normal and breath sounds normal. No respiratory  distress.  Abdominal: Soft. Bowel sounds are normal. There is no tenderness.  Musculoskeletal: Normal range of motion.  Superficial abrasion to the left knee No effusion no patellar dislocation good range of motion.  Slight bruise to the palm of the left hand. No significant tenderness. No deformity. Radial pulses 2+.  Neurological: She is alert and oriented to person, place, and time. No cranial nerve deficit. She exhibits normal muscle tone. Coordination normal.  Skin: Skin is warm.  Nursing note and vitals reviewed.   ED Course  Procedures (including critical care time) Labs Review Labs Reviewed  PROTIME-INR - Abnormal; Notable for the following:    Prothrombin Time 17.4 (*)    All other components within normal limits    Imaging Review Ct Head Wo Contrast  12/10/2015  CLINICAL DATA:  Fall. EXAM: CT HEAD WITHOUT CONTRAST CT MAXILLOFACIAL WITHOUT CONTRAST CT CERVICAL SPINE WITHOUT CONTRAST TECHNIQUE: Multidetector CT imaging of the head, cervical spine, and maxillofacial structures were performed using the standard protocol without intravenous contrast. Multiplanar CT image  reconstructions of the cervical spine and maxillofacial structures were also generated. COMPARISON:  11/04/2015 FINDINGS: CT HEAD FINDINGS There is mild diffuse low-attenuation within the subcortical and periventricular white matter compatible with chronic microvascular disease. Prominence of the sulci and ventricles noted. There is no evidence for acute brain infarct, intracranial hemorrhage or mass. There is a large left frontal scalp hematoma. No underlying calvarial fracture. CT MAXILLOFACIAL FINDINGS Large left frontal scalp and left preseptal hematoma is identified. The orbits are intact. No blow-out fracture identified. The sinuses also appear intact without evidence for fracture. The nasal bone is intact in the nasal septum is midline. Normal appearance of the mandible. CT CERVICAL SPINE FINDINGS The alignment of the cervical spine is normal. The disc spaces are well preserved. There is multi level ventral endplate spurring identified. The prevertebral soft tissue space appears normal. No acute fracture or subluxation identified. IMPRESSION: 1. No acute intracranial abnormality. 2. Left frontal scalp and left preseptal hematoma. 3. Small vessel ischemic change and brain atrophy. 4. No evidence for facial bone fracture. 5. No evidence for cervical spine fracture. Electronically Signed   By: Signa Kell M.D.   On: 12/10/2015 15:13   Ct Cervical Spine Wo Contrast  12/10/2015  CLINICAL DATA:  Fall. EXAM: CT HEAD WITHOUT CONTRAST CT MAXILLOFACIAL WITHOUT CONTRAST CT CERVICAL SPINE WITHOUT CONTRAST TECHNIQUE: Multidetector CT imaging of the head, cervical spine, and maxillofacial structures were performed using the standard protocol without intravenous contrast. Multiplanar CT image reconstructions of the cervical spine and maxillofacial structures were also generated. COMPARISON:  11/04/2015 FINDINGS: CT HEAD FINDINGS There is mild diffuse low-attenuation within the subcortical and periventricular white  matter compatible with chronic microvascular disease. Prominence of the sulci and ventricles noted. There is no evidence for acute brain infarct, intracranial hemorrhage or mass. There is a large left frontal scalp hematoma. No underlying calvarial fracture. CT MAXILLOFACIAL FINDINGS Large left frontal scalp and left preseptal hematoma is identified. The orbits are intact. No blow-out fracture identified. The sinuses also appear intact without evidence for fracture. The nasal bone is intact in the nasal septum is midline. Normal appearance of the mandible. CT CERVICAL SPINE FINDINGS The alignment of the cervical spine is normal. The disc spaces are well preserved. There is multi level ventral endplate spurring identified. The prevertebral soft tissue space appears normal. No acute fracture or subluxation identified. IMPRESSION: 1. No acute intracranial abnormality. 2. Left frontal scalp and left preseptal hematoma. 3. Small  vessel ischemic change and brain atrophy. 4. No evidence for facial bone fracture. 5. No evidence for cervical spine fracture. Electronically Signed   By: Signa Kell M.D.   On: 12/10/2015 15:13   Ct Maxillofacial Wo Cm  12/10/2015  CLINICAL DATA:  Fall. EXAM: CT HEAD WITHOUT CONTRAST CT MAXILLOFACIAL WITHOUT CONTRAST CT CERVICAL SPINE WITHOUT CONTRAST TECHNIQUE: Multidetector CT imaging of the head, cervical spine, and maxillofacial structures were performed using the standard protocol without intravenous contrast. Multiplanar CT image reconstructions of the cervical spine and maxillofacial structures were also generated. COMPARISON:  11/04/2015 FINDINGS: CT HEAD FINDINGS There is mild diffuse low-attenuation within the subcortical and periventricular white matter compatible with chronic microvascular disease. Prominence of the sulci and ventricles noted. There is no evidence for acute brain infarct, intracranial hemorrhage or mass. There is a large left frontal scalp hematoma. No  underlying calvarial fracture. CT MAXILLOFACIAL FINDINGS Large left frontal scalp and left preseptal hematoma is identified. The orbits are intact. No blow-out fracture identified. The sinuses also appear intact without evidence for fracture. The nasal bone is intact in the nasal septum is midline. Normal appearance of the mandible. CT CERVICAL SPINE FINDINGS The alignment of the cervical spine is normal. The disc spaces are well preserved. There is multi level ventral endplate spurring identified. The prevertebral soft tissue space appears normal. No acute fracture or subluxation identified. IMPRESSION: 1. No acute intracranial abnormality. 2. Left frontal scalp and left preseptal hematoma. 3. Small vessel ischemic change and brain atrophy. 4. No evidence for facial bone fracture. 5. No evidence for cervical spine fracture. Electronically Signed   By: Signa Kell M.D.   On: 12/10/2015 15:13   I have personally reviewed and evaluated these images and lab results as part of my medical decision-making.   EKG Interpretation None      MDM   Final diagnoses:  Fall, initial encounter  Head injury, initial encounter   Patient fell on her back deck. Tetanus shots up-to-date. Patient stumbled no loss of consciousness she landed on the left side of her face. And also an abrasion to her left knee. No evidence of any bony injury effusion or patellar dislocation. Main concern was for the head trauma patient is on Coumadin. However she has been subtherapeutic her doctors are working on adjusting that. Recheck today shows it to be 1.41. CT of head face and neck without any acute brain or bony abnormalities. Patient's left for head with hematoma and abrasion.  Local wound care for the abrasion to left for head.    Vanetta Mulders, MD 12/10/15 1806

## 2015-12-10 NOTE — ED Notes (Signed)
Pt in CT; has not arrived to room.

## 2015-12-10 NOTE — ED Notes (Addendum)
Pt reports stumbling and falling, denies syncopal episode or loc. Hit her head on porch, has large hematoma to left eyebrow and forehead. Also has abrasion to left leg. Pt is on coumadin.

## 2015-12-10 NOTE — ED Notes (Signed)
Pt d/c home with daughter via w/c.

## 2015-12-10 NOTE — ED Notes (Signed)
Assuming care of pt at this time. Report received. Pt's family at bedside.

## 2015-12-10 NOTE — Discharge Instructions (Signed)
Wound care wash the abrasions to the left for head with soap and water and apply Neosporin ointment daily. Expect significant black ice over the next several weeks. Your INR was 1.41 today. No significant change. Continue your adjustment up as per your doctors arranged the day. Return for any new or worse symptoms.

## 2015-12-10 NOTE — ED Notes (Signed)
Pt assisted to bedside commode at this time

## 2015-12-10 NOTE — ED Notes (Signed)
MD at bedside. 

## 2015-12-15 ENCOUNTER — Other Ambulatory Visit: Payer: Self-pay

## 2015-12-15 NOTE — Patient Outreach (Signed)
[facility]) Care Management  12/15/2015  Alexis [name] 1931-08-03 [id number]  Successful outreach to patient at home number. Educated patient regarding HIPAA and confidentiality and verified patient identity.  Subjective: Patient is alert and oriented. Reported she fell on Sunday (12/10/2015) and went to the emergency room. Patient stated that she fell and hit her head and has a large bruise from the top of her face to her chin. Denies any pain. Denies any dizziness or loss of consciousness at fall. Stated that she stumped her toe on her deck and lost her balance.  Currently denies any pain. Reports that she has no other injuries as a result of the fall.  Reports that she has an appointment at Coumadin clinic on Monday, 7/24. Stated that she is taking a higher dose of Coumadin, but does not recall what the dose is.  Objective: Per record review: Significant hematoma to the left for head measuring about 3 x 5 cm. Swelling to the upper eyelid. Abrasions to the left for head no laceration. Ecchymosis below left eye and a little bit below right eye. Superficial abrasion to the left knee No effusion no patellar dislocation good range of motion.Slight bruise to the palm of the left hand. No significant tenderness. No deformity. INR 1.41. CT head, cervical spine and maxillofacial with no fractures noted.  Assessment: Patient reports feeling better since fall with no significant needs. Has a follow up appointment scheduled to recheck INR.   Plan: Continue safety education; follow up within next week for home visit.  Turkey R. Indianna Boran, RN, BSN, CCM [facility] Care Management Coordinator [phone]

## 2015-12-16 ENCOUNTER — Other Ambulatory Visit: Payer: Self-pay | Admitting: Internal Medicine

## 2015-12-18 ENCOUNTER — Ambulatory Visit (INDEPENDENT_AMBULATORY_CARE_PROVIDER_SITE_OTHER): Payer: Medicare Other | Admitting: Pharmacist

## 2015-12-18 DIAGNOSIS — I481 Persistent atrial fibrillation: Secondary | ICD-10-CM

## 2015-12-18 DIAGNOSIS — I4819 Other persistent atrial fibrillation: Secondary | ICD-10-CM

## 2015-12-18 DIAGNOSIS — I480 Paroxysmal atrial fibrillation: Secondary | ICD-10-CM | POA: Diagnosis not present

## 2015-12-18 DIAGNOSIS — Z7901 Long term (current) use of anticoagulants: Secondary | ICD-10-CM | POA: Diagnosis not present

## 2015-12-18 LAB — POCT INR: INR: 1.3

## 2015-12-21 ENCOUNTER — Other Ambulatory Visit: Payer: Self-pay

## 2015-12-22 NOTE — Patient Outreach (Signed)
[facility]) Care Management  12/21/2015  Alexis [name] Sep 05, 1931 [id number]   Home visit completed with patient.  Subjective: Patient reports she is feeling better since her fall. Currently denies any pain, shortness of breath or swelling in extremities. Stated that she went to have her "Coumadin checked" on Monday and her levels are still low. Stated that she was instructed to take 1 and  on Monday and Friday, and take only 1 tablet all other days. Stated she has a follow up to have her levels drawn on Monday 12/25/2015. Patient stated that when she fell, she hit her head and had a large bruise that was bleeding above her left eye. Stated that the ER doctor told her the bruise may move over into the right side of her face/eye area, but it moved down instead. She reported she had some bruising/swelling in her left arm following the fall but it is better. Also reports she hit her knee and has a few cuts on her left knee.  Objective: BP 140/58 Pulse 56 SpO2 96% Assessment: Patient is alert and oriented with no signs of distress noted.  Patient has a large hematoma over left eye that patient states has reduced in size. Bruising to left side of face extends from hematoma above left eye to below chin/neck area on left side of face. Patient denies any pain. No edema noted in lower extremities. Patient has 3 healing skin tears to her left knee, covered with scabs.  Provided education about fall safety. Also provided the following EMMI education: Taking Warfarin: How to Stay Safe; Taking Warfarin: The Basics; Taking Warfarin: Managing Your Medication; and What Can Affect Warfarin. Patient verbalized understanding. Patient instructed to watch for signs and symptoms of bleeding and bruising. Also educated to watch for signs and symptoms of blood clots due to still adjusting dose of Coumadin.  Plan: Continue to provide support and education. Will follow up next week via phone call for  updated INR and monitor for safety and signs and symptoms of complications.   Turkey R. Hilliary Jock, RN, BSN, CCM [facility] Care Management Coordinator [phone]

## 2015-12-25 ENCOUNTER — Ambulatory Visit (INDEPENDENT_AMBULATORY_CARE_PROVIDER_SITE_OTHER): Payer: Medicare Other | Admitting: Pharmacist Clinician (PhC)/ Clinical Pharmacy Specialist

## 2015-12-25 DIAGNOSIS — I481 Persistent atrial fibrillation: Secondary | ICD-10-CM

## 2015-12-25 DIAGNOSIS — I4819 Other persistent atrial fibrillation: Secondary | ICD-10-CM

## 2015-12-25 DIAGNOSIS — I480 Paroxysmal atrial fibrillation: Secondary | ICD-10-CM

## 2015-12-25 DIAGNOSIS — Z7901 Long term (current) use of anticoagulants: Secondary | ICD-10-CM

## 2015-12-25 LAB — POCT INR: INR: 1.4

## 2015-12-26 ENCOUNTER — Ambulatory Visit: Payer: Self-pay

## 2015-12-29 ENCOUNTER — Other Ambulatory Visit: Payer: Self-pay

## 2015-12-29 NOTE — Patient Outreach (Signed)
[facility]) Care Management  12/29/15  Alexis [name] 31-Mar-1932 [id number]  Attempted outreach to patient without success. Left HIPAA compliant voicemail providing RNCM contact information and invited callback.   Turkey R. Maie Kesinger, RN, BSN, CCM [facility] Care Management Coordinator [phone]

## 2016-01-03 ENCOUNTER — Ambulatory Visit (INDEPENDENT_AMBULATORY_CARE_PROVIDER_SITE_OTHER): Payer: Medicare Other | Admitting: Pharmacist Clinician (PhC)/ Clinical Pharmacy Specialist

## 2016-01-03 DIAGNOSIS — I481 Persistent atrial fibrillation: Secondary | ICD-10-CM

## 2016-01-03 DIAGNOSIS — Z7901 Long term (current) use of anticoagulants: Secondary | ICD-10-CM | POA: Diagnosis not present

## 2016-01-03 DIAGNOSIS — I480 Paroxysmal atrial fibrillation: Secondary | ICD-10-CM | POA: Diagnosis not present

## 2016-01-03 DIAGNOSIS — I4819 Other persistent atrial fibrillation: Secondary | ICD-10-CM

## 2016-01-03 LAB — POCT INR: INR: 2.2

## 2016-01-05 ENCOUNTER — Other Ambulatory Visit: Payer: Self-pay

## 2016-01-05 NOTE — Patient Outreach (Signed)
[facility]) Care Management   01/05/16  JAMIELYN LOVGREN 1932/03/22 [id number]  Successful outreach call completed with patient. Patient identification verified.  Patient is alert and oriented with no complaints today. Denies any chest pain, shortness of breath or swelling in hands/feet. Stated that she is still weighing herself daily and has not had a 3 lb weight gain in 24 hours. Her weight today was 132. Stated that she has been eating a little more because her belly was wrinkled and she was worried she was getting too skinny. Encouraged patient to watch for signs and symptoms of heart failure and patient verbalized understanding.   Patient reports that her bruising on her face is getting better. Stated that most of it was completely gone, but still has a little black eye. Stated that the doctor told her it would take up to 2 months for it to get better. Patient stated that she has no new bruising or signs and symptoms of bleeding. Stated that she had her INR checked this week and it was "finally" in a therapeutic range.   Patient has no other complaints or concerns. RNCM advised will follow up in a few weeks and patient is agreeable.  Turkey R. Quinley Nesler, RN, BSN, CCM [facility] Care Management Coordinator [phone]

## 2016-01-15 ENCOUNTER — Other Ambulatory Visit: Payer: Self-pay

## 2016-01-16 NOTE — Patient Outreach (Signed)
[facility]) Care Management  01/15/2016  CHAMYA KURTA 1932-02-04 [id number]  Attempted to outreach patient without success. Left HIPAA compliant voicemail with RNCM contact information and invited callback.  Turkey R. Enolia Koepke, RN, BSN, CCM [facility] Care Management Coordinator [phone]

## 2016-01-18 ENCOUNTER — Ambulatory Visit (INDEPENDENT_AMBULATORY_CARE_PROVIDER_SITE_OTHER): Payer: Medicare Other | Admitting: Pharmacist

## 2016-01-18 DIAGNOSIS — I480 Paroxysmal atrial fibrillation: Secondary | ICD-10-CM | POA: Diagnosis not present

## 2016-01-18 DIAGNOSIS — Z7901 Long term (current) use of anticoagulants: Secondary | ICD-10-CM

## 2016-01-18 DIAGNOSIS — I481 Persistent atrial fibrillation: Secondary | ICD-10-CM | POA: Diagnosis not present

## 2016-01-18 DIAGNOSIS — I4819 Other persistent atrial fibrillation: Secondary | ICD-10-CM

## 2016-01-18 LAB — POCT INR: INR: 2.3

## 2016-01-28 ENCOUNTER — Other Ambulatory Visit: Payer: Self-pay | Admitting: Internal Medicine

## 2016-01-30 NOTE — Telephone Encounter (Signed)
Rx(s) sent to pharmacy electronically.  

## 2016-02-14 ENCOUNTER — Ambulatory Visit (INDEPENDENT_AMBULATORY_CARE_PROVIDER_SITE_OTHER): Payer: Medicare Other | Admitting: Pharmacist Clinician (PhC)/ Clinical Pharmacy Specialist

## 2016-02-14 ENCOUNTER — Ambulatory Visit (INDEPENDENT_AMBULATORY_CARE_PROVIDER_SITE_OTHER): Payer: Medicare Other | Admitting: Internal Medicine

## 2016-02-14 ENCOUNTER — Encounter: Payer: Self-pay | Admitting: Internal Medicine

## 2016-02-14 VITALS — BP 196/58 | HR 52 | Ht 61.0 in | Wt 135.0 lb

## 2016-02-14 DIAGNOSIS — Z7901 Long term (current) use of anticoagulants: Secondary | ICD-10-CM

## 2016-02-14 DIAGNOSIS — I5030 Unspecified diastolic (congestive) heart failure: Secondary | ICD-10-CM

## 2016-02-14 DIAGNOSIS — I481 Persistent atrial fibrillation: Secondary | ICD-10-CM

## 2016-02-14 DIAGNOSIS — N184 Chronic kidney disease, stage 4 (severe): Secondary | ICD-10-CM

## 2016-02-14 DIAGNOSIS — I4819 Other persistent atrial fibrillation: Secondary | ICD-10-CM

## 2016-02-14 DIAGNOSIS — I10 Essential (primary) hypertension: Secondary | ICD-10-CM

## 2016-02-14 DIAGNOSIS — I480 Paroxysmal atrial fibrillation: Secondary | ICD-10-CM

## 2016-02-14 LAB — POCT INR: INR: 3.9

## 2016-02-14 NOTE — Patient Instructions (Signed)
Medication Instructions:  Your physician recommends that you continue on your current medications as directed. Please refer to the Current Medication list given to you today.  Labwork: None Ordered  Testing/Procedures: None ordered  Follow-Up: Your physician wants you to follow-up in: November 2017. You will receive a reminder letter in the mail two months in advance. If you don't receive a letter, please call our office to schedule the follow-up appointment.    Any Other Special Instructions Will Be Listed Below (If Applicable).  Dr Wilder Glade you to check your blood pressure and weight Daily and notify the office if you gain more than 2 lbs.    If you need a refill on your cardiac medications before your next appointment, please call your pharmacy.

## 2016-02-14 NOTE — Progress Notes (Signed)
OFFICE NOTE  Chief Complaint:  Follow-up  Primary Care Physician: [name], PA-C  HPI:  Alexis [name] is a pleasant 82 year old female who was recently seen at any and for heart failure exacerbation. She presented at this couple of weeks of increasing shortness of breath, cough, increasing abdominal girth and lower extremity edema. On admission her BNP was elevated and she responded fairly quickly to diuresis. This caused improvement in her breathing. She was noted to be hypertensive and continues to be hypertensive today. She has had some acute on chronic renal insufficiency.  She reports that when she was discharged she was sent home on oxygen, but was never told why she needed it. She followed up with her primary care provider, who is with [facility] and he recommended that she see a cardiologist to help determine whether she needs to continue to take oxygen. She apparently sought out our group and on seeing her today.  Her past medical history is significant for hypertension, diabetes, dyslipidemia and family history of heart disease.  She reports that since discharge her breathing is better, however when she does marked exertion she does feel short of breath. She is not using her oxygen. She denies any chest pain.  An echocardiogram performed at [facility] showed an EF of 65-70%, with moderate concentric LVH, and no wall motion abnormalities.  She's never had a stress test evaluation.  Alexis [name] returns today for followup of her stress test. This is a nuclear stress test that showed no reversible ischemia and a preserved EF greater than 70%. She reports that she's been undergoing rehabilitation at home and feels that her shortness of breath is improving. It is now a point where she does not feel that she needs oxygen. We went ahead and ambulated her around the office today and her baseline SpO2 was 98% and decreased to 95% with exertion. This is markedly improved from her  previous office visit where her oxygen saturation dropped to 74% on room air.  Also at her last office visit I started her on amlodipine 10 mg daily and this helped both lower blood pressure and I suspect will ask her pulmonary artery pressures as well.  I saw Alexis [name] back today from her recent hospitalization. She was also due for annual follow-up. She was recently admitted for what sounds like a diastolic heart failure exacerbation, possibly precipitated by pneumonia and there was clear medication noncompliance. She was not taking her Lasix as prescribed. Also there may been some dietary noncompliance. She had about 6-8 pounds of weight gain which was diuresed down to 151 pounds on discharge. She is currently weighing 156 pounds at home and waited at 158 pounds in the office. She reports no significant shortness of breath and has been "religious" about taking her Lasix on a daily basis. She says she is eating more than she had before she is currently living with her daughter.  Alexis [name] was again hospitalized for diastolic heart failure/COPD. Again there is suspicion of possible noncompliance. She diuresed and was given antibiotics/steroids. Breathing is somewhat improved. She is currently on 3 L continuous oxygen. She has not been back to see her pulmonologist yet. She was placed on Lasix 60 mg daily after a pulse dose of Lasix 120 mg for 3 days. She is currently complaining of leg swelling and some shortness of breath with mild exertion.  I saw Alexis [name] again recently in the hospital this time in the setting of acute  GI bleeding. She was found to be anemic however a source of her bleeding was not identified. She did undergo EGD. She was also found to have new onset atrial fibrillation. Her CHADSVASC score is 6, therefore ultimately anticoagulation must be considered although at the time with her active bleeding it was not pursued. We talked at length today in the office about anticoagulation  and her stroke risk and she is deathly afraid of stroke, which is very reasonable given her risk. Given the possible options for anticoagulation, the safest option may be warfarin which could be easily reversed if recurrent bleeding is noted. She currently reports dark stools but is on twice daily iron.  Alexis [name] returns today for follow-up. She was recently hospitalized at [facility] and treated by the hospital medicine service for acute congestive heart failure/hypoxia related to COPD. A repeat echo in January 2017 showed normal LV systolic function with diastolic dysfunction and high LV filling pressure. She was diuresed and blood pressure was treated as she was noted to be markedly hypertensive on admission. At discharge her weight was 62 kg and she was transitioned to torsemide 40 mg every morning and 20 mg every afternoon. She does have stage IV chronic kidney disease which is been stable. She was taken off of aspirin but remains on warfarin for anticoagulation for her A. fib and CHADSVASC score of 6. She reports her weight has been stable. She denies any worsening shortness of breath. With a long discussion today about salt avoidance and it seems that there may be some dietary indiscretion as a cause of some of her recurrent heart failure exacerbations.  10/13/2015  Alexis [name] returns today for follow-up from her recent hospitalization. I saw her while she was hospitalized and help treat her for diastolic heart failure. This was thought related to atrial fibrillation which she was in more persistently. After we diuresed her, I cardioverted her and she has maintained a sinus bradycardia since then. She had some additional diuresis on follow-up with Theodore Demark, PA-C. Her weight is now down 3 additional pounds to a dry weight of 128 pounds. She said this is the best she's felt in several months. She is also made efforts to reduce sodium in her diet and is watching her weight very  carefully.  02/14/2016  Alexis [name] returns today for follow-up. She seems to be fairly stable. Her weight is now up though to 135 from 128. She says she's had no worsening swelling or shortness of breath. She says that her appetite is been really good except when you talk about the types of food she is eating it seems that her oral intake is not that high. I'm concerned that she is reaccumulating fluid. Blood pressure today was markedly elevated at 196/58. She thought that she would not take her blood pressure medicine because I was going to do a physical on her today. I don't understand why she thought that she should not take her blood pressure medicine. Unfortunate she does not have a blood pressure cuff at home and says she cannot afford one.  PMHx:  Past Medical History:  Diagnosis Date  . Anxiety   . Chronic diastolic CHF (congestive heart failure) (HCC)    a. 05/2015 Echo: EF 55-60%, mild LVH, no rwma, mild MR, mildly dil RA, mod TR, PASP .  . CKD (chronic kidney disease), stage IV (HCC)   . Confusion state   . COPD (chronic obstructive pulmonary disease) (HCC)    a. on home  O2 @ 3lpm.  . Elevated troponin    a. H/o elevated trop in setting of CHF; b. 06/2013 Lexiscan MV: nl EF, mild anterior breast attenuation, no ischemia.   . Family history of adverse reaction to anesthesia   . GIB (gastrointestinal bleeding)    a. History of GIB.  Marland Kitchen Hypertensive heart disease   . Macular degeneration, bilateral   . Neuropathy (HCC)   . Paroxysmal atrial fibrillation (HCC)    a. CHA2DS2VASc = 6-->coumadin.  Marland Kitchen PONV (postoperative nausea and vomiting)   . Type II diabetes mellitus (HCC)   . Varicose veins     Past Surgical History:  Procedure Laterality Date  . ABDOMINAL HYSTERECTOMY    . CARDIOVERSION N/A 09/22/2015   Procedure: CARDIOVERSION;  Surgeon: Chrystie Nose, MD;  Location: [facility] ENDOSCOPY;  Service: Cardiovascular;  Laterality: N/A;  . ESOPHAGOGASTRODUODENOSCOPY N/A  12/01/2014   Procedure: ESOPHAGOGASTRODUODENOSCOPY (EGD);  Surgeon: Hilarie Fredrickson, MD;  Location: [facility] ENDOSCOPY;  Service: Endoscopy;  Laterality: N/A;  . HIP FRACTURE SURGERY  2004  . VEIN SURGERY      FAMHx:  Family History  Problem Relation Age of Onset  . Colon cancer Father 65    advanced when discovered, no surgery or therapy.  this led to his death  . Breast cancer Sister   . Cancer Brother   . Breast cancer Mother   . Lung cancer Brother   . Thyroid disease Neg Hx   . Heart attack Neg Hx   . Hypertension Mother     SOCHx:   reports that she is a non-smoker but has been exposed to tobacco smoke. She has never used smokeless tobacco. She reports that she does not drink alcohol or use drugs.  ALLERGIES:  Allergies  Allergen Reactions  . Yellow Dyes (Non-Tartrazine) Nausea And Vomiting    "deathly allergic" No other information given to explain reaction. Per Daughter- this was an IV dye. Ok with yellow coloring on Coumadin tablets.   . Codeine Hives  . Other Other (See Comments)    novocaine broke mouth out  . Penicillins Hives  . Procaine Hives    ROS: Pertinent items noted in HPI and remainder of comprehensive ROS otherwise negative.  HOME MEDS: Current Outpatient Prescriptions  Medication Sig Dispense Refill  . albuterol (PROVENTIL HFA;VENTOLIN HFA) 108 (90 BASE) MCG/ACT inhaler Inhale 2 puffs into the lungs every 6 (six) hours as needed for wheezing or shortness of breath. 1 Inhaler 3  . atorvastatin (LIPITOR) 20 MG tablet Take 20 mg by mouth daily.    . cholecalciferol (VITAMIN D) 1000 UNITS tablet Take 1,000 Units by mouth every morning.     . docusate sodium (COLACE) 100 MG capsule Take 100 mg by mouth 2 (two) times daily as needed for mild constipation.    Marland Kitchen escitalopram (LEXAPRO) 10 MG tablet Take 10 mg by mouth daily.    . fenofibrate 160 MG tablet Take 160 mg by mouth daily.    Marland Kitchen gabapentin (NEURONTIN) 100 MG capsule Take 1 capsule (100 mg total) by mouth at  bedtime. 30 capsule 0  . hydrALAZINE (APRESOLINE) 25 MG tablet TAKE 1 TABLET BY MOUTH EVERY 12 HOURS 60 tablet 8  . iron polysaccharides (NIFEREX) 150 MG capsule Take 1 capsule (150 mg total) by mouth daily. 30 capsule 3  . isosorbide mononitrate (IMDUR) 30 MG 24 hr tablet TAKE 1 TABLET BY MOUTH EVERY DAY 30 tablet 3  . metoprolol tartrate (LOPRESSOR) 25 MG tablet Take 1 tablet (  25 mg total) by mouth 2 (two) times daily. 60 tablet 0  . Multiple Vitamin (MULTIVITAMIN WITH MINERALS) TABS tablet Take 1 tablet by mouth daily.    . OXYGEN Inhale 3 L into the lungs at bedtime.     . polyethylene glycol (MIRALAX / GLYCOLAX) packet Take 17 g by mouth daily as needed for mild constipation.    . potassium chloride SA (K-DUR,KLOR-CON) 20 MEQ tablet Take 20 mEq by mouth daily.    Marland Kitchen. torsemide (DEMADEX) 20 MG tablet Take 2 tablets (40 mg total) by mouth daily. 60 tablet 2  . warfarin (COUMADIN) 4 MG tablet Take 1-1.5 tablets by mouth daily as directed by coumadin clinic (Patient taking differently: Take 4 mg by mouth daily. by mouth daily as directed by coumadin clinic) 40 tablet 4   No current facility-administered medications for this visit.     LABS/IMAGING: No results found for this or any previous visit (from the past 48 hour(s)). No results found.  VITALS: BP (!) 196/58   Pulse (!) 52   Ht 5\' 1"  (1.549 m)   Wt 135 lb (61.2 kg)   BMI 25.51 kg/m   EXAM: General appearance: alert and no distress Neck: no carotid bruit and no JVD Lungs: diminished breath sounds bibasilar Heart: regular rate and rhythm, S1, S2 normal, no murmur, click, rub or gallop Abdomen: soft, non-tender; bowel sounds normal; no masses,  no organomegaly Extremities: extremities normal, atraumatic, no cyanosis or edema Pulses: 2+ and symmetric Skin: Skin color, texture, turgor normal. No rashes or lesions Neurologic: Grossly normal Psych: Mood, affect normal  EKG: Sinus bradycardia 52  ASSESSMENT: 1. Recent acute  diastolic heart failure 2. Persistent atrial fibrillation-CHADSVASC score of 6 on warfarin, s/p cardioversion 3. Anemia  4. Hypertensive heart disease with moderate LVH 5. Hypertension - controlled 6. Dyslipidemia 7. Diabetes type 2 8. Family history of heart disease 9. COPD on 3 L O2 continuous  PLAN: 1.   Mrs. Merilynn FinlandRobertson is maintaining sinus rhythm. Her INR needs to be adjusted today. She seems to be euvolemic however her weight is up. I'm not sure this is all related to diet over the past several months but may be due to some fluid excess. She reports that she is compliant with her diuretics and says that she avoid salt. I like for her to have a recheck of her blood pressure after she takes her medication in the hypertension clinic in a few weeks at that time her weight can be reassessed and if she continues to gain weight she may need adjustment in her diuretics. Follow-up with me in 2 months.  Chrystie NoseKenneth [name], MD, [facility] CheyenneFACC Attending Cardiologist [facility]  Chrystie NoseKenneth C Hilty 02/14/2016, 5:23 PM

## 2016-02-18 ENCOUNTER — Other Ambulatory Visit: Payer: Self-pay | Admitting: Internal Medicine

## 2016-02-23 ENCOUNTER — Other Ambulatory Visit: Payer: Self-pay | Admitting: Internal Medicine

## 2016-02-26 ENCOUNTER — Ambulatory Visit (INDEPENDENT_AMBULATORY_CARE_PROVIDER_SITE_OTHER): Payer: Medicare Other | Admitting: Pharmacist Clinician (PhC)/ Clinical Pharmacy Specialist

## 2016-02-26 DIAGNOSIS — I481 Persistent atrial fibrillation: Secondary | ICD-10-CM

## 2016-02-26 DIAGNOSIS — Z7901 Long term (current) use of anticoagulants: Secondary | ICD-10-CM | POA: Diagnosis not present

## 2016-02-26 DIAGNOSIS — I4819 Other persistent atrial fibrillation: Secondary | ICD-10-CM

## 2016-02-26 DIAGNOSIS — I480 Paroxysmal atrial fibrillation: Secondary | ICD-10-CM

## 2016-02-26 LAB — POCT INR: INR: 2.6

## 2016-02-27 ENCOUNTER — Other Ambulatory Visit: Payer: Self-pay | Admitting: Physician Assistant

## 2016-02-27 ENCOUNTER — Ambulatory Visit
Admission: RE | Admit: 2016-02-27 | Discharge: 2016-02-27 | Disposition: A | Discharge status: Home / Self Care | Payer: Medicare Other | Source: Ambulatory Visit | Attending: Physician Assistant | Admitting: Physician Assistant

## 2016-02-27 DIAGNOSIS — R0789 Other chest pain: Secondary | ICD-10-CM

## 2016-02-27 DIAGNOSIS — R5383 Other fatigue: Secondary | ICD-10-CM

## 2016-02-28 ENCOUNTER — Inpatient Hospital Stay (HOSPITAL_COMMUNITY)
Admission: EM | Admit: 2016-02-28 | Discharge: 2016-03-04 | DRG: 871 | Disposition: A | Discharge status: Home / Self Care | Payer: Medicare Other | Attending: Internal Medicine | Admitting: Internal Medicine

## 2016-02-28 ENCOUNTER — Emergency Department (HOSPITAL_COMMUNITY): Payer: Medicare Other

## 2016-02-28 ENCOUNTER — Encounter (HOSPITAL_COMMUNITY): Payer: Self-pay

## 2016-02-28 ENCOUNTER — Inpatient Hospital Stay (HOSPITAL_COMMUNITY): Payer: Medicare Other

## 2016-02-28 DIAGNOSIS — F418 Other specified anxiety disorders: Secondary | ICD-10-CM | POA: Diagnosis present

## 2016-02-28 DIAGNOSIS — R6520 Severe sepsis without septic shock: Secondary | ICD-10-CM | POA: Diagnosis present

## 2016-02-28 DIAGNOSIS — E8809 Other disorders of plasma-protein metabolism, not elsewhere classified: Secondary | ICD-10-CM | POA: Diagnosis present

## 2016-02-28 DIAGNOSIS — I130 Hypertensive heart and chronic kidney disease with heart failure and stage 1 through stage 4 chronic kidney disease, or unspecified chronic kidney disease: Secondary | ICD-10-CM | POA: Diagnosis present

## 2016-02-28 DIAGNOSIS — H9190 Unspecified hearing loss, unspecified ear: Secondary | ICD-10-CM | POA: Diagnosis present

## 2016-02-28 DIAGNOSIS — I5032 Chronic diastolic (congestive) heart failure: Secondary | ICD-10-CM | POA: Diagnosis present

## 2016-02-28 DIAGNOSIS — N390 Urinary tract infection, site not specified: Secondary | ICD-10-CM | POA: Diagnosis present

## 2016-02-28 DIAGNOSIS — Z7901 Long term (current) use of anticoagulants: Secondary | ICD-10-CM

## 2016-02-28 DIAGNOSIS — E1122 Type 2 diabetes mellitus with diabetic chronic kidney disease: Secondary | ICD-10-CM | POA: Diagnosis present

## 2016-02-28 DIAGNOSIS — J189 Pneumonia, unspecified organism: Secondary | ICD-10-CM | POA: Diagnosis present

## 2016-02-28 DIAGNOSIS — A419 Sepsis, unspecified organism: Secondary | ICD-10-CM | POA: Diagnosis present

## 2016-02-28 DIAGNOSIS — R531 Weakness: Secondary | ICD-10-CM

## 2016-02-28 DIAGNOSIS — J440 Chronic obstructive pulmonary disease with acute lower respiratory infection: Secondary | ICD-10-CM | POA: Diagnosis present

## 2016-02-28 DIAGNOSIS — L89322 Pressure ulcer of left buttock, stage 2: Secondary | ICD-10-CM | POA: Diagnosis present

## 2016-02-28 DIAGNOSIS — I5030 Unspecified diastolic (congestive) heart failure: Secondary | ICD-10-CM | POA: Diagnosis not present

## 2016-02-28 DIAGNOSIS — I4819 Other persistent atrial fibrillation: Secondary | ICD-10-CM | POA: Diagnosis present

## 2016-02-28 DIAGNOSIS — N179 Acute kidney failure, unspecified: Secondary | ICD-10-CM

## 2016-02-28 DIAGNOSIS — E1165 Type 2 diabetes mellitus with hyperglycemia: Secondary | ICD-10-CM | POA: Diagnosis present

## 2016-02-28 DIAGNOSIS — E860 Dehydration: Secondary | ICD-10-CM | POA: Diagnosis present

## 2016-02-28 DIAGNOSIS — E1142 Type 2 diabetes mellitus with diabetic polyneuropathy: Secondary | ICD-10-CM | POA: Diagnosis present

## 2016-02-28 DIAGNOSIS — L8990 Pressure ulcer of unspecified site, unspecified stage: Secondary | ICD-10-CM | POA: Insufficient documentation

## 2016-02-28 DIAGNOSIS — L89312 Pressure ulcer of right buttock, stage 2: Secondary | ICD-10-CM | POA: Diagnosis present

## 2016-02-28 DIAGNOSIS — N184 Chronic kidney disease, stage 4 (severe): Secondary | ICD-10-CM | POA: Diagnosis present

## 2016-02-28 DIAGNOSIS — Z66 Do not resuscitate: Secondary | ICD-10-CM

## 2016-02-28 DIAGNOSIS — R509 Fever, unspecified: Secondary | ICD-10-CM

## 2016-02-28 DIAGNOSIS — E785 Hyperlipidemia, unspecified: Secondary | ICD-10-CM | POA: Diagnosis present

## 2016-02-28 DIAGNOSIS — E1140 Type 2 diabetes mellitus with diabetic neuropathy, unspecified: Secondary | ICD-10-CM | POA: Diagnosis present

## 2016-02-28 DIAGNOSIS — J42 Unspecified chronic bronchitis: Secondary | ICD-10-CM

## 2016-02-28 DIAGNOSIS — J181 Lobar pneumonia, unspecified organism: Secondary | ICD-10-CM | POA: Diagnosis not present

## 2016-02-28 DIAGNOSIS — I481 Persistent atrial fibrillation: Secondary | ICD-10-CM | POA: Diagnosis present

## 2016-02-28 DIAGNOSIS — I10 Essential (primary) hypertension: Secondary | ICD-10-CM | POA: Diagnosis present

## 2016-02-28 DIAGNOSIS — R791 Abnormal coagulation profile: Secondary | ICD-10-CM

## 2016-02-28 DIAGNOSIS — D638 Anemia in other chronic diseases classified elsewhere: Secondary | ICD-10-CM | POA: Diagnosis present

## 2016-02-28 DIAGNOSIS — J449 Chronic obstructive pulmonary disease, unspecified: Secondary | ICD-10-CM | POA: Diagnosis present

## 2016-02-28 LAB — RESPIRATORY PANEL BY PCR
Adenovirus: NOT DETECTED
BORDETELLA PERTUSSIS-RVPCR: NOT DETECTED
CORONAVIRUS 229E-RVPPCR: NOT DETECTED
Chlamydophila pneumoniae: NOT DETECTED
Coronavirus HKU1: NOT DETECTED
Coronavirus NL63: NOT DETECTED
Coronavirus OC43: NOT DETECTED
INFLUENZA B-RVPPCR: NOT DETECTED
Influenza A: NOT DETECTED
MYCOPLASMA PNEUMONIAE-RVPPCR: NOT DETECTED
Metapneumovirus: NOT DETECTED
Parainfluenza Virus 1: NOT DETECTED
Parainfluenza Virus 2: NOT DETECTED
Parainfluenza Virus 3: NOT DETECTED
Parainfluenza Virus 4: NOT DETECTED
RESPIRATORY SYNCYTIAL VIRUS-RVPPCR: NOT DETECTED
Rhinovirus / Enterovirus: NOT DETECTED

## 2016-02-28 LAB — URINALYSIS, ROUTINE W REFLEX MICROSCOPIC
Bilirubin Urine: NEGATIVE
Glucose, UA: 250 mg/dL — AB
KETONES UR: NEGATIVE mg/dL
NITRITE: NEGATIVE
PH: 5.5 (ref 5.0–8.0)
Protein, ur: >300 mg/dL — AB
SPECIFIC GRAVITY, URINE: 1.019 (ref 1.005–1.030)

## 2016-02-28 LAB — PROTIME-INR
INR: 2.71
PROTHROMBIN TIME: 29.3 s — AB (ref 11.4–15.2)

## 2016-02-28 LAB — CBC WITH DIFFERENTIAL/PLATELET
Basophils Absolute: 0 10*3/uL (ref 0.0–0.1)
Basophils Relative: 0 %
Eosinophils Absolute: 0 10*3/uL (ref 0.0–0.7)
Eosinophils Relative: 0 %
HCT: 32.4 % — ABNORMAL LOW (ref 36.0–46.0)
HEMOGLOBIN: 10.5 g/dL — AB (ref 12.0–15.0)
LYMPHS ABS: 1.1 10*3/uL (ref 0.7–4.0)
LYMPHS PCT: 6 %
MCH: 27.6 pg (ref 26.0–34.0)
MCHC: 32.4 g/dL (ref 30.0–36.0)
MCV: 85 fL (ref 78.0–100.0)
Monocytes Absolute: 1.2 10*3/uL — ABNORMAL HIGH (ref 0.1–1.0)
Monocytes Relative: 7 %
NEUTROS ABS: 14.5 10*3/uL — AB (ref 1.7–7.7)
NEUTROS PCT: 87 %
Platelets: 210 10*3/uL (ref 150–400)
RBC: 3.81 MIL/uL — AB (ref 3.87–5.11)
RDW: 14.7 % (ref 11.5–15.5)
WBC: 16.7 10*3/uL — AB (ref 4.0–10.5)

## 2016-02-28 LAB — BASIC METABOLIC PANEL
Anion gap: 10 (ref 5–15)
BUN: 46 mg/dL — AB (ref 6–20)
CHLORIDE: 98 mmol/L — AB (ref 101–111)
CO2: 24 mmol/L (ref 22–32)
Calcium: 9.3 mg/dL (ref 8.9–10.3)
Creatinine, Ser: 2.25 mg/dL — ABNORMAL HIGH (ref 0.44–1.00)
GFR calc Af Amer: 22 mL/min — ABNORMAL LOW (ref >60)
GFR calc non Af Amer: 19 mL/min — ABNORMAL LOW (ref >60)
GLUCOSE: 318 mg/dL — AB (ref 65–99)
POTASSIUM: 3.8 mmol/L (ref 3.5–5.1)
Sodium: 132 mmol/L — ABNORMAL LOW (ref 135–145)

## 2016-02-28 LAB — BRAIN NATRIURETIC PEPTIDE: B NATRIURETIC PEPTIDE 5: 890.2 pg/mL — AB (ref 0.0–100.0)

## 2016-02-28 LAB — GLUCOSE, CAPILLARY
GLUCOSE-CAPILLARY: 367 mg/dL — AB (ref 65–99)
GLUCOSE-CAPILLARY: 299 mg/dL — AB (ref 65–99)
Glucose-Capillary: 224 mg/dL — ABNORMAL HIGH (ref 65–99)
Glucose-Capillary: 227 mg/dL — ABNORMAL HIGH (ref 65–99)
Glucose-Capillary: 253 mg/dL — ABNORMAL HIGH (ref 65–99)

## 2016-02-28 LAB — HIV ANTIBODY (ROUTINE TESTING W REFLEX): HIV SCREEN 4TH GENERATION: NONREACTIVE

## 2016-02-28 LAB — STREP PNEUMONIAE URINARY ANTIGEN: Strep Pneumo Urinary Antigen: NEGATIVE

## 2016-02-28 LAB — URINE MICROSCOPIC-ADD ON

## 2016-02-28 LAB — I-STAT CG4 LACTIC ACID, ED: LACTIC ACID, VENOUS: 0.64 mmol/L (ref 0.5–1.9)

## 2016-02-28 LAB — CREATININE, URINE, RANDOM: CREATININE, URINE: 98.62 mg/dL

## 2016-02-28 MED ORDER — METOPROLOL TARTRATE 25 MG PO TABS
25.0000 mg | ORAL_TABLET | Freq: Two times a day (BID) | ORAL | Status: DC
  Administered 2016-02-28 – 2016-03-04 (×12): 25 mg via ORAL
  Filled 2016-02-28 (×12): qty 1

## 2016-02-28 MED ORDER — DOCUSATE SODIUM 100 MG PO CAPS: 100.0000 mg | ORAL_CAPSULE | Freq: Two times a day (BID) | ORAL | Status: DC | PRN

## 2016-02-28 MED ORDER — SODIUM CHLORIDE 0.9 % IV SOLN
INTRAVENOUS | Status: DC
  Administered 2016-02-28: 05:00:00 via INTRAVENOUS

## 2016-02-28 MED ORDER — FENOFIBRATE 160 MG PO TABS
160.0000 mg | ORAL_TABLET | Freq: Every day | ORAL | Status: DC
  Administered 2016-02-28 – 2016-03-04 (×6): 160 mg via ORAL
  Filled 2016-02-28 (×6): qty 1

## 2016-02-28 MED ORDER — WARFARIN SODIUM 6 MG PO TABS
6.0000 mg | ORAL_TABLET | ORAL | Status: DC
  Administered 2016-02-28: 6 mg via ORAL
  Filled 2016-02-28: qty 1

## 2016-02-28 MED ORDER — ALBUTEROL SULFATE (2.5 MG/3ML) 0.083% IN NEBU
2.5000 mg | INHALATION_SOLUTION | RESPIRATORY_TRACT | Status: DC | PRN
  Administered 2016-02-28: 2.5 mg via RESPIRATORY_TRACT
  Filled 2016-02-28: qty 3

## 2016-02-28 MED ORDER — WARFARIN - PHARMACIST DOSING INPATIENT: Freq: Every day | Status: DC

## 2016-02-28 MED ORDER — CEFTRIAXONE SODIUM 1 G IJ SOLR
1.0000 g | Freq: Once | INTRAMUSCULAR | Status: AC
  Administered 2016-02-28: 1 g via INTRAVENOUS
  Filled 2016-02-28: qty 10

## 2016-02-28 MED ORDER — GUAIFENESIN 100 MG/5ML PO SYRP
200.0000 mg | ORAL_SOLUTION | ORAL | Status: DC | PRN
  Filled 2016-02-28: qty 10

## 2016-02-28 MED ORDER — INSULIN ASPART 100 UNIT/ML ~~LOC~~ SOLN
0.0000 [IU] | Freq: Every day | SUBCUTANEOUS | Status: DC
  Administered 2016-02-28 – 2016-02-29 (×2): 3 [IU] via SUBCUTANEOUS
  Administered 2016-03-02: 2 [IU] via SUBCUTANEOUS
  Administered 2016-03-03: 3 [IU] via SUBCUTANEOUS

## 2016-02-28 MED ORDER — WARFARIN SODIUM 4 MG PO TABS: 4.0000 mg | ORAL_TABLET | ORAL | Status: DC

## 2016-02-28 MED ORDER — ISOSORBIDE MONONITRATE ER 30 MG PO TB24
30.0000 mg | ORAL_TABLET | Freq: Every day | ORAL | Status: DC
  Administered 2016-02-28 – 2016-03-03 (×5): 30 mg via ORAL
  Filled 2016-02-28 (×5): qty 1

## 2016-02-28 MED ORDER — INSULIN ASPART 100 UNIT/ML ~~LOC~~ SOLN
0.0000 [IU] | Freq: Three times a day (TID) | SUBCUTANEOUS | Status: DC
  Administered 2016-02-28: 5 [IU] via SUBCUTANEOUS
  Administered 2016-02-28: 9 [IU] via SUBCUTANEOUS
  Administered 2016-02-28: 3 [IU] via SUBCUTANEOUS

## 2016-02-28 MED ORDER — DOXYCYCLINE HYCLATE 100 MG PO TABS
100.0000 mg | ORAL_TABLET | Freq: Two times a day (BID) | ORAL | Status: DC
  Administered 2016-02-28 – 2016-03-01 (×6): 100 mg via ORAL
  Filled 2016-02-28 (×7): qty 1

## 2016-02-28 MED ORDER — POLYETHYLENE GLYCOL 3350 17 G PO PACK: 17.0000 g | PACK | Freq: Every day | ORAL | Status: DC | PRN

## 2016-02-28 MED ORDER — IPRATROPIUM-ALBUTEROL 0.5-2.5 (3) MG/3ML IN SOLN
3.0000 mL | Freq: Three times a day (TID) | RESPIRATORY_TRACT | Status: DC
  Administered 2016-02-28 – 2016-02-29 (×5): 3 mL via RESPIRATORY_TRACT
  Filled 2016-02-28 (×8): qty 3

## 2016-02-28 MED ORDER — ESCITALOPRAM OXALATE 10 MG PO TABS
10.0000 mg | ORAL_TABLET | Freq: Every day | ORAL | Status: DC
  Administered 2016-02-28 – 2016-03-04 (×6): 10 mg via ORAL
  Filled 2016-02-28 (×6): qty 1

## 2016-02-28 MED ORDER — SODIUM CHLORIDE 0.9 % IV BOLUS (SEPSIS)
1000.0000 mL | Freq: Once | INTRAVENOUS | Status: AC
  Administered 2016-02-28: 1000 mL via INTRAVENOUS

## 2016-02-28 MED ORDER — ADULT MULTIVITAMIN W/MINERALS CH
1.0000 | ORAL_TABLET | Freq: Every day | ORAL | Status: DC
  Administered 2016-02-28 – 2016-03-04 (×6): 1 via ORAL
  Filled 2016-02-28 (×6): qty 1

## 2016-02-28 MED ORDER — POLYSACCHARIDE IRON COMPLEX 150 MG PO CAPS
150.0000 mg | ORAL_CAPSULE | Freq: Every day | ORAL | Status: DC
  Administered 2016-02-28 – 2016-03-04 (×6): 150 mg via ORAL
  Filled 2016-02-28 (×6): qty 1

## 2016-02-28 MED ORDER — VITAMIN D 1000 UNITS PO TABS
1000.0000 [IU] | ORAL_TABLET | Freq: Every morning | ORAL | Status: DC
  Administered 2016-02-28 – 2016-03-04 (×6): 1000 [IU] via ORAL
  Filled 2016-02-28 (×6): qty 1

## 2016-02-28 MED ORDER — GABAPENTIN 100 MG PO CAPS
100.0000 mg | ORAL_CAPSULE | Freq: Every day | ORAL | Status: DC
  Administered 2016-02-28 – 2016-03-03 (×5): 100 mg via ORAL
  Filled 2016-02-28 (×5): qty 1

## 2016-02-28 MED ORDER — HYDRALAZINE HCL 25 MG PO TABS
25.0000 mg | ORAL_TABLET | Freq: Two times a day (BID) | ORAL | Status: DC
  Administered 2016-02-29 – 2016-03-03 (×7): 25 mg via ORAL
  Filled 2016-02-28 (×7): qty 1

## 2016-02-28 MED ORDER — IPRATROPIUM-ALBUTEROL 0.5-2.5 (3) MG/3ML IN SOLN: 3.0000 mL | RESPIRATORY_TRACT | Status: DC

## 2016-02-28 MED ORDER — INSULIN ASPART 100 UNIT/ML ~~LOC~~ SOLN
0.0000 [IU] | Freq: Three times a day (TID) | SUBCUTANEOUS | Status: DC
  Administered 2016-02-29: 3 [IU] via SUBCUTANEOUS
  Administered 2016-02-29 (×2): 8 [IU] via SUBCUTANEOUS
  Administered 2016-03-01 (×2): 11 [IU] via SUBCUTANEOUS
  Administered 2016-03-01: 5 [IU] via SUBCUTANEOUS
  Administered 2016-03-02: 8 [IU] via SUBCUTANEOUS
  Administered 2016-03-02: 11 [IU] via SUBCUTANEOUS
  Administered 2016-03-02: 5 [IU] via SUBCUTANEOUS
  Administered 2016-03-03: 3 [IU] via SUBCUTANEOUS
  Administered 2016-03-03: 5 [IU] via SUBCUTANEOUS
  Administered 2016-03-03: 8 [IU] via SUBCUTANEOUS
  Administered 2016-03-04: 11 [IU] via SUBCUTANEOUS
  Administered 2016-03-04: 3 [IU] via SUBCUTANEOUS

## 2016-02-28 MED ORDER — SODIUM CHLORIDE 0.9 % IV BOLUS (SEPSIS)
1000.0000 mL | Freq: Once | INTRAVENOUS | Status: AC
  Administered 2016-02-28 (×2): 1000 mL via INTRAVENOUS

## 2016-02-28 MED ORDER — DOXYCYCLINE HYCLATE 100 MG IV SOLR
100.0000 mg | Freq: Once | INTRAVENOUS | Status: DC
  Filled 2016-02-28: qty 100

## 2016-02-28 MED ORDER — CEFTRIAXONE SODIUM 1 G IJ SOLR: 1.0000 g | INTRAMUSCULAR | Status: DC

## 2016-02-28 MED ORDER — DEXTROSE 5 % IV SOLN
1.0000 g | INTRAVENOUS | Status: DC
  Administered 2016-02-29 – 2016-03-02 (×3): 1 g via INTRAVENOUS
  Filled 2016-02-28 (×3): qty 10

## 2016-02-28 MED ORDER — ATORVASTATIN CALCIUM 20 MG PO TABS
20.0000 mg | ORAL_TABLET | Freq: Every day | ORAL | Status: DC
  Administered 2016-02-28 – 2016-03-03 (×5): 20 mg via ORAL
  Filled 2016-02-28 (×5): qty 1

## 2016-02-28 NOTE — Progress Notes (Signed)
[facility]  H&P and chart reviewed. Patient examined and plan discussed. Son at bedside states mother has been confused and he found her sprawled out on the floor a couple of days ago.   Principal Problem:   Sepsis/ CAP and UTI - cont Rocephin and Doxycycline- f/u cultures, legionella and strep antigens - as infitrate is RUL, will order MBS to ensure she has no aspiration issues  Active Problems:   Chronic diastolic heart failure, NYHA class 1 - compensated- holding Torsemide today -cont Toprol - follows with [facility]    Acute renal failure superimposed on stage 4 chronic kidney disease (HCC) - baseline Cr ranges from 1.7 to 2.25  - holding Torsemide for today    Essential hypertension with hypertensive heart disease - Toprol / Imdur    Atrial fibrillation, persistent - s/p cardioversion CHA2DS2-VASc Score 6 -Coumadin/ Toprol - HR stable    COPD (chronic obstructive pulmonary disease)  - cont nebs  - on 3 L O2 at home  AOCD - stable  Diabetes mellitus  -cont ISS- Hb A1c 8.5 on 4/17- does not appear to be on medications at home - recheck Hb A1c - increase SSI for today-     Depression with anxiety - Lexapro  Pressure injury of skin-stage 2  - cont wound care for pressure ulcer  HLD  -Continue home medications: Lipitor and fenofibrate  Calvert Cantor, MD Pager: [email or web address]

## 2016-02-28 NOTE — ED Notes (Signed)
Pt placed on 2 L oxygen, pt normally wears o2 at night

## 2016-02-28 NOTE — Progress Notes (Signed)
New Admission Note: Pt admitted to [facility] from the [facility]  Arrival Method: via stretcher Mental Orientation: Alert and oriented x 3 Telemetry: placed on box 6E28 Assessment: Completed Skin: bilateral Stage 2 ulcers on buttocks IV: Infusing bolus Pain: Denies Tubes: None Safety Measures: Safety Fall Prevention Plan has been discussed  Admission: To be completed  [address] [place] Orientation: Patient has been orientated to the room, unit and staff.  Family: None at bedside at this time  Orders to be reviewed and implemented. Will continue to monitor the patient. Call light has been placed within reach and bed alarm has been activated.   Burley Saver, BSN, RN-BC Phone: [phone]

## 2016-02-28 NOTE — Evaluation (Signed)
Occupational Therapy Evaluation Patient Details Name: Alexis GivensChristine E [name] MRN: [id number] DOB: 07/11/1931 Today's Date: 02/28/2016    History of Present Illness Alexis GivensChristine E [name] is a 82 y.o. female with medical history significant of hypertension, hyperlipidemia, diabetes mellitus, COPD, depression, anxiety, PAF on Coumadin, GIB, CKD-IV, macular degeneration, poor hearing, who presents with cough, chest pain and generalized weakness.   Clinical Impression   Pt was assisted for IADL, but performing self care at a modified independent level prior to admission. She is HOH and has low vision at baseline, has a hx of falls and uses 02 at home. Pt sleeping in chair upon OTs arrival. Son reports she sleeps periodically throughout the day. Pt presents with generalized weakness, impaired standing balance and impaired cognition. Pt noted to hold liquids in her mouth for a prolonged period prior to swallowing, RN aware. Will follow acutely.     Follow Up Recommendations  Home health OT;Supervision/Assistance - 24 hour    Equipment Recommendations  None recommended by OT    Recommendations for Other Services       Precautions / Restrictions Precautions Precautions: Fall Restrictions Weight Bearing Restrictions: No      Mobility Bed Mobility      General bed mobility comments: pt in chair  Transfers Overall transfer level: Needs assistance Equipment used: 1 person hand held assist Transfers: Sit to/from Stand Sit to Stand: Min assist         General transfer comment: steadying assist only from recliner and BSC    Balance Overall balance assessment: Needs assistance   Sitting balance-Leahy Scale: Fair       Standing balance-Leahy Scale: Poor                              ADL Overall ADL's : Needs assistance/impaired Eating/Feeding: Set up;Sitting   Grooming: Wash/dry hands;Wash/dry face;Sitting;Set up   Upper Body Bathing: Set up;Sitting   Lower  Body Bathing: Moderate assistance;Sit to/from stand   Upper Body Dressing : Set up;Sitting   Lower Body Dressing: Moderate assistance;Sit to/from stand   Toilet Transfer: Minimal assistance;Ambulation;BSC   Toileting- Clothing Manipulation and Hygiene: Minimal assistance;Sit to/from stand       Functional mobility during ADLs: Minimal assistance;Rolling walker General ADL Comments: attempted to cross foot over opposite knee to don sock unsuccessfully     Vision Additional Comments: baseline low vision   Perception     Praxis      Pertinent Vitals/Pain Pain Assessment: No/denies pain     Hand Dominance Right   Extremity/Trunk Assessment Upper Extremity Assessment Upper Extremity Assessment: RUE deficits/detail;LUE deficits/detail (complicated by impaired vision) RUE Coordination: decreased fine motor LUE Coordination: decreased fine motor   Lower Extremity Assessment Lower Extremity Assessment: Defer to PT evaluation       Communication Communication Communication: HOH   Cognition Arousal/Alertness: Awake/alert Behavior During Therapy: WFL for tasks assessed/performed Overall Cognitive Status: History of cognitive impairments - at baseline       Memory: Decreased short-term memory             General Comments       Exercises       Shoulder Instructions      Home Living Family/patient expects to be discharged to:: Private residence Living Arrangements: Children Available Help at Discharge: Family;Available 24 hours/day Type of Home: House Home Access: Level entry     Home Layout: One level     Bathroom Shower/Tub:  Walk-in shower;Door   [employer] Toilet: Handicapped height Bathroom Accessibility: Yes How Accessible: Accessible via walker Home Equipment: Shower seat;Bedside commode;Walker - 4 wheels;Other (comment);Wheelchair - manual   Additional Comments: Pt sleeps in recliner      Prior Functioning/Environment Level of Independence:  Needs assistance  Gait / Transfers Assistance Needed: ambulates with rollator ADL's / Homemaking Assistance Needed: son assists occasionally with getting dressed, son does all IADLs, including driving   Comments: hx of falls; uses 3L O2 at night and prn during day        OT Problem List: Decreased strength;Decreased activity tolerance;Impaired balance (sitting and/or standing);Impaired vision/perception;Decreased coordination;Decreased cognition;Decreased safety awareness;Decreased knowledge of use of DME or AE;Impaired UE functional use   OT Treatment/Interventions: Self-care/ADL training;DME and/or AE instruction;Therapeutic activities;Patient/family education;Balance training;Cognitive remediation/compensation    OT Goals(Current goals can be found in the care plan section) Acute Rehab OT Goals Patient Stated Goal: to feel better OT Goal Formulation: With patient Time For Goal Achievement: 03/13/16 Potential to Achieve Goals: Good ADL Goals Pt Will Perform Grooming: with supervision;standing Pt Will Perform Lower Body Bathing: with supervision;sit to/from stand Pt Will Perform Lower Body Dressing: with supervision;sit to/from stand Pt Will Transfer to Toilet: with supervision;ambulating;bedside commode (over toilet) Pt Will Perform Toileting - Clothing Manipulation and hygiene: with supervision;sit to/from stand Pt Will Perform Tub/Shower Transfer: Shower transfer;ambulating;with supervision;shower seat;rolling walker  OT Frequency: Min 2X/week   Barriers to D/C:            Co-evaluation              End of Session Equipment Utilized During Treatment: Gait belt;Oxygen Nurse Communication:  (hold liquids in her mouth for a prolonged period)  Activity Tolerance: Patient tolerated treatment well Patient left: in chair;with call bell/phone within reach;with chair alarm set;with family/visitor present   Time: 1700-1749 OT Time Calculation (min): 24 min Charges:  OT  General Charges $OT Visit: 1 Procedure OT Evaluation $OT Eval Moderate Complexity: 1 Procedure OT Treatments $Self Care/Home Management : 8-22 mins G-Codes:    Evern Bio 02/28/2016, 12:46 PM  [phone]

## 2016-02-28 NOTE — Progress Notes (Signed)
ANTICOAGULATION CONSULT NOTE - Initial Consult  Pharmacy Consult for Coumadin Indication: atrial fibrillation  Allergies  Allergen Reactions  . Yellow Dyes (Non-Tartrazine) Nausea And Vomiting    "deathly allergic" No other information given to explain reaction. Per Daughter- this was an IV dye. Ok with yellow coloring on Coumadin tablets.   . Codeine Hives  . Other Other (See Comments)    novocaine broke mouth out  . Penicillins Hives  . Procaine Hives    Patient Measurements: Height: 5\' 4"  (162.6 cm) Weight: 135 lb (61.2 kg) IBW/kg (Calculated) : 54.7  Vital Signs: Temp: 100 F (37.8 C) (10/04 0024) Temp Source: Oral (10/04 0024) BP: 156/56 (10/04 0330) Pulse Rate: 74 (10/04 0330)  Labs:  Recent Labs  02/26/16 1443 02/28/16 0151  HGB  --  10.5*  HCT  --  32.4*  PLT  --  210  LABPROT  --  29.3*  INR 2.6 2.71  CREATININE  --  2.25*    Estimated Creatinine Clearance: 16.6 mL/min (by C-G formula based on SCr of 2.25 mg/dL (H)).   Medical History: Past Medical History:  Diagnosis Date  . Anxiety   . Chronic diastolic CHF (congestive heart failure) (HCC)    a. 05/2015 Echo: EF 55-60%, mild LVH, no rwma, mild MR, mildly dil RA, mod TR, PASP 58mmHg.  . CKD (chronic kidney disease), stage IV (HCC)   . Confusion state   . COPD (chronic obstructive pulmonary disease) (HCC)    a. on home O2 @ 3lpm.  . Elevated troponin    a. H/o elevated trop in setting of CHF; b. 06/2013 Lexiscan MV: nl EF, mild anterior breast attenuation, no ischemia.   . Family history of adverse reaction to anesthesia   . GIB (gastrointestinal bleeding)    a. History of GIB.  Marland Kitchen. Hypertensive heart disease   . Macular degeneration, bilateral   . Neuropathy (HCC)   . Paroxysmal atrial fibrillation (HCC)    a. CHA2DS2VASc = 6-->coumadin.  Marland Kitchen. PONV (postoperative nausea and vomiting)   . Type II diabetes mellitus (HCC)   . Varicose veins     Medications:  No current facility-administered  medications on file prior to encounter.    Current Outpatient Prescriptions on File Prior to Encounter  Medication Sig Dispense Refill  . albuterol (PROVENTIL HFA;VENTOLIN HFA) 108 (90 BASE) MCG/ACT inhaler Inhale 2 puffs into the lungs every 6 (six) hours as needed for wheezing or shortness of breath. 1 Inhaler 3  . atorvastatin (LIPITOR) 20 MG tablet Take 20 mg by mouth daily.    . cholecalciferol (VITAMIN D) 1000 UNITS tablet Take 1,000 Units by mouth every morning.     . docusate sodium (COLACE) 100 MG capsule Take 100 mg by mouth 2 (two) times daily as needed for mild constipation.    Marland Kitchen. escitalopram (LEXAPRO) 10 MG tablet Take 10 mg by mouth daily.    . fenofibrate 160 MG tablet Take 160 mg by mouth daily.    Marland Kitchen. gabapentin (NEURONTIN) 100 MG capsule Take 1 capsule (100 mg total) by mouth at bedtime. 30 capsule 0  . hydrALAZINE (APRESOLINE) 25 MG tablet TAKE 1 TABLET BY MOUTH EVERY 12 HOURS 60 tablet 8  . iron polysaccharides (NIFEREX) 150 MG capsule Take 1 capsule (150 mg total) by mouth daily. 30 capsule 3  . isosorbide mononitrate (IMDUR) 30 MG 24 hr tablet TAKE 1 TABLET BY MOUTH EVERY DAY 30 tablet 1  . metoprolol tartrate (LOPRESSOR) 25 MG tablet Take 1 tablet (25  mg total) by mouth 2 (two) times daily. 60 tablet 0  . Multiple Vitamin (MULTIVITAMIN WITH MINERALS) TABS tablet Take 1 tablet by mouth daily.    . OXYGEN Inhale 3 L into the lungs at bedtime.     . polyethylene glycol (MIRALAX / GLYCOLAX) packet Take 17 g by mouth daily as needed for mild constipation.    . potassium chloride SA (K-DUR,KLOR-CON) 20 MEQ tablet Take 20 mEq by mouth daily.    Marland Kitchen torsemide (DEMADEX) 20 MG tablet TAKE 2 TABLETS BY MOUTH EVERY DAY 60 tablet 2  . warfarin (COUMADIN) 4 MG tablet Take 1-1.5 tablets by mouth daily as directed by coumadin clinic (Patient taking differently: Take 4 mg by mouth daily. by mouth daily as directed by coumadin clinic) 40 tablet 4     Assessment: 82 y.o. female admitted with  UTI, h/o Afib, to continue Coumadin.  Home Coumadin regimen 6 mg MWF, 4 mg TTSS   Goal of Therapy:  INR 2-3 Monitor platelets by anticoagulation protocol: Yes   Plan:  Continue Coumadin as at home Daily INR  Alexis [name], Gary Fleet 02/28/2016,3:52 AM

## 2016-02-28 NOTE — ED Provider Notes (Signed)
[facility]-EMERGENCY DEPT Provider Note   CSN: [id number] Arrival date & time: 02/28/16  0001  By signing my name below, I, Sandrea HammondStephen Dignan, attest that this documentation has been prepared under the direction and in the presence of Derwood KaplanAnkit Denecia Brunette, MD. Electronically Signed: Sandrea HammondStephen Dignan, ED Scribe. 02/28/16. 3:08 AM.   History   Chief Complaint Chief Complaint  Patient presents with  . Weakness    HPI Comments: Alexis [name] is a 82 y.o. female who presents to the Emergency Department due to weakness. Per nurse, pt's son said pt has increased weakness and is no longer able to ambulate independently since 2 days ago. Per nurse, pt lives with her son. Pt says she has frequent urination. She denies headache, neck pain, dyspnea, abdominal pain, nausea, dysuria, hematuria, or back pain.  Pt also complains of new-onset chest pain. She denies any precipitating factors.  The history is provided by the patient. No language interpreter was used.    Past Medical History:  Diagnosis Date  . Anxiety   . Chronic diastolic CHF (congestive heart failure) (HCC)    a. 05/2015 Echo: EF 55-60%, mild LVH, no rwma, mild MR, mildly dil RA, mod TR, PASP 58mmHg.  . CKD (chronic kidney disease), stage IV (HCC)   . Confusion state   . COPD (chronic obstructive pulmonary disease) (HCC)    a. on home O2 @ 3lpm.  . Elevated troponin    a. H/o elevated trop in setting of CHF; b. 06/2013 Lexiscan MV: nl EF, mild anterior breast attenuation, no ischemia.   . Family history of adverse reaction to anesthesia   . GIB (gastrointestinal bleeding)    a. History of GIB.  Marland Kitchen. Hypertensive heart disease   . Macular degeneration, bilateral   . Neuropathy (HCC)   . Paroxysmal atrial fibrillation (HCC)    a. CHA2DS2VASc = 6-->coumadin.  Marland Kitchen. PONV (postoperative nausea and vomiting)   . Type II diabetes mellitus (HCC)   . Varicose veins     Patient Active Problem List   Diagnosis Date Noted  . COPD exacerbation  (HCC)   . Acute on chronic diastolic heart failure (HCC) 10/21/2015  . Type II diabetes mellitus (HCC)   . Hypertensive heart disease   . Atrial fibrillation, persistent (HCC)   . Absolute anemia   . Diabetes mellitus with complication (HCC)   . Simple chronic bronchitis (HCC)   . Shortness of breath 09/18/2015  . CHF exacerbation (HCC) 09/18/2015  . Anemia 09/18/2015  . Diabetes (HCC) 09/18/2015  . Hypertensive urgency   . CHF (congestive heart failure) (HCC) 08/02/2015  . Acute respiratory failure (HCC) 08/02/2015  . Acute diastolic CHF (congestive heart failure) (HCC) 08/02/2015  . Acute on chronic diastolic congestive heart failure, NYHA class 1 (HCC) 07/01/2015  . Chronic respiratory failure with hypoxia (HCC) 06/30/2015  . Hypoglycemia associated with type 2 diabetes mellitus (HCC) 06/30/2015  . Closed displaced fracture of left great toe 06/30/2015  . CKD (chronic kidney disease), stage IV (HCC) 06/30/2015  . UTI (lower urinary tract infection) 06/30/2015  . Hypoglycemia associated with diabetes (HCC) 06/30/2015  . Hyperthyroidism 01/23/2015  . Long-term (current) use of anticoagulants 12/12/2014  . Paroxysmal atrial fibrillation (HCC) 12/01/2014  . Melena   . Pressure ulcer 11/30/2014  . Normocytic anemia 11/29/2014  . COPD (chronic obstructive pulmonary disease) (HCC) 07/12/2014  . (HFpEF) heart failure with preserved ejection fraction (HCC) 07/16/2013  . Essential hypertension 06/10/2013  . Chronic diastolic heart failure, NYHA class 1 (HCC) 06/09/2013  Past Surgical History:  Procedure Laterality Date  . ABDOMINAL HYSTERECTOMY    . CARDIOVERSION N/A 09/22/2015   Procedure: CARDIOVERSION;  Surgeon: Chrystie Nose, MD;  Location: [facility] ENDOSCOPY;  Service: Cardiovascular;  Laterality: N/A;  . ESOPHAGOGASTRODUODENOSCOPY N/A 12/01/2014   Procedure: ESOPHAGOGASTRODUODENOSCOPY (EGD);  Surgeon: Hilarie Fredrickson, MD;  Location: [facility] ENDOSCOPY;  Service: Endoscopy;  Laterality: N/A;   . HIP FRACTURE SURGERY  2004  . VEIN SURGERY      OB History    No data available       Home Medications    Prior to Admission medications   Medication Sig Start Date End Date Taking? Authorizing Provider  albuterol (PROVENTIL HFA;VENTOLIN HFA) 108 (90 BASE) MCG/ACT inhaler Inhale 2 puffs into the lungs every 6 (six) hours as needed for wheezing or shortness of breath. 06/11/13   Nishant Dhungel, MD  atorvastatin (LIPITOR) 20 MG tablet Take 20 mg by mouth daily.    Historical Provider, MD  cholecalciferol (VITAMIN D) 1000 UNITS tablet Take 1,000 Units by mouth every morning.     Historical Provider, MD  docusate sodium (COLACE) 100 MG capsule Take 100 mg by mouth 2 (two) times daily as needed for mild constipation.    Historical Provider, MD  escitalopram (LEXAPRO) 10 MG tablet Take 10 mg by mouth daily.    Historical Provider, MD  fenofibrate 160 MG tablet Take 160 mg by mouth daily.    Historical Provider, MD  gabapentin (NEURONTIN) 100 MG capsule Take 1 capsule (100 mg total) by mouth at bedtime. 08/05/15   Rodolph Bong, MD  hydrALAZINE (APRESOLINE) 25 MG tablet TAKE 1 TABLET BY MOUTH EVERY 12 HOURS 01/30/16   Chrystie Nose, MD  iron polysaccharides (NIFEREX) 150 MG capsule Take 1 capsule (150 mg total) by mouth daily. 09/22/15   Ripudeep Jenna Luo, MD  isosorbide mononitrate (IMDUR) 30 MG 24 hr tablet TAKE 1 TABLET BY MOUTH EVERY DAY 02/23/16   Chrystie Nose, MD  metoprolol tartrate (LOPRESSOR) 25 MG tablet Take 1 tablet (25 mg total) by mouth 2 (two) times daily. 10/23/15   Campbell Stall, MD  Multiple Vitamin (MULTIVITAMIN WITH MINERALS) TABS tablet Take 1 tablet by mouth daily.    Historical Provider, MD  OXYGEN Inhale 3 L into the lungs at bedtime.     Historical Provider, MD  polyethylene glycol (MIRALAX / GLYCOLAX) packet Take 17 g by mouth daily as needed for mild constipation.    Historical Provider, MD  potassium chloride SA (K-DUR,KLOR-CON) 20 MEQ tablet Take 20 mEq by mouth  daily.    Historical Provider, MD  torsemide (DEMADEX) 20 MG tablet TAKE 2 TABLETS BY MOUTH EVERY DAY 02/19/16   Chrystie Nose, MD  warfarin (COUMADIN) 4 MG tablet Take 1-1.5 tablets by mouth daily as directed by coumadin clinic Patient taking differently: Take 4 mg by mouth daily. by mouth daily as directed by coumadin clinic 12/08/15   Chrystie Nose, MD    Family History Family History  Problem Relation Age of Onset  . Colon cancer Father 52    advanced when discovered, no surgery or therapy.  this led to his death  . Breast cancer Mother   . Hypertension Mother   . Breast cancer Sister   . Cancer Brother   . Lung cancer Brother   . Thyroid disease Neg Hx   . Heart attack Neg Hx     Social History Social History  Substance Use Topics  . Smoking  status: Passive Smoke Exposure - Never Smoker  . Smokeless tobacco: Never Used  . Alcohol use No     Allergies   Yellow dyes (non-tartrazine); Codeine; Other; Penicillins; and Procaine   Review of Systems Review of Systems  Respiratory: Negative for shortness of breath.   Cardiovascular: Positive for chest pain.  Gastrointestinal: Negative for abdominal pain and nausea.  Endocrine: Positive for polyuria.  Genitourinary: Negative for dysuria and hematuria.  Musculoskeletal: Negative for back pain and neck pain.  Neurological: Negative for headaches.     10 Systems reviewed and are negative for acute change except as noted in the HPI.    Physical Exam Updated Vital Signs BP (!) 146/51   Pulse 80   Temp 100 F (37.8 C) (Oral)   Resp 21   Ht 5\' 4"  (1.626 m)   Wt 135 lb (61.2 kg)   SpO2 91%   BMI 23.17 kg/m   Physical Exam  Constitutional: She is oriented to person, place, and time. She appears well-developed and well-nourished. No distress.  HENT:  Head: Normocephalic and atraumatic.  Right Ear: Hearing normal.  Left Ear: Hearing normal.  Nose: Nose normal.  Mouth/Throat: Oropharynx is clear and moist and  mucous membranes are normal.  Mucous membranes moist  Eyes: Conjunctivae and EOM are normal. Pupils are equal, round, and reactive to light.  Pupils 2 mm and equal  Neck: Normal range of motion. Neck supple.  Cardiovascular: Normal rate, regular rhythm, S1 normal and S2 normal.   Murmur heard. Pulmonary/Chest: Effort normal and breath sounds normal. No respiratory distress. She has no wheezes. She has no rales. She exhibits no tenderness.  Abdominal: Soft. Normal appearance and bowel sounds are normal. There is no hepatosplenomegaly. There is tenderness. There is no rebound, no guarding, no tenderness at McBurney's point and negative Murphy's sign.  Tenderness in the lower quadrants bilaterally  Musculoskeletal: Normal range of motion.  No pitting edema or unilateral LE swelling  Neurological: She is alert and oriented to person, place, and time. She has normal strength. No cranial nerve deficit or sensory deficit. Coordination normal. GCS eye subscore is 4. GCS verbal subscore is 5. GCS motor subscore is 6.  Cranial nerves II-XII intact  Skin: Skin is warm, dry and intact. No rash noted. No cyanosis.  Psychiatric: She has a normal mood and affect. Her speech is normal and behavior is normal. Thought content normal.  Nursing note and vitals reviewed.    ED Treatments / Results   DIAGNOSTIC STUDIES: Oxygen Saturation is 94% on RA, adequate by my interpretation.    COORDINATION OF CARE: 12:24 AM Discussed treatment plan with pt at bedside and pt agreed to plan.   Labs (all labs ordered are listed, but only abnormal results are displayed) Labs Reviewed  CBC WITH DIFFERENTIAL/PLATELET - Abnormal; Notable for the following:       Result Value   WBC 16.7 (*)    RBC 3.81 (*)    Hemoglobin 10.5 (*)    HCT 32.4 (*)    Neutro Abs 14.5 (*)    Monocytes Absolute 1.2 (*)    All other components within normal limits  BASIC METABOLIC PANEL - Abnormal; Notable for the following:    Sodium  132 (*)    Chloride 98 (*)    Glucose, Bld 318 (*)    BUN 46 (*)    Creatinine, Ser 2.25 (*)    GFR calc non Af Amer 19 (*)    GFR calc Af  Amer 22 (*)    All other components within normal limits  PROTIME-INR - Abnormal; Notable for the following:    Prothrombin Time 29.3 (*)    All other components within normal limits  URINALYSIS, ROUTINE W REFLEX MICROSCOPIC (NOT AT [facility]) - Abnormal; Notable for the following:    APPearance TURBID (*)    Glucose, UA 250 (*)    Hgb urine dipstick TRACE (*)    Protein, ur >300 (*)    Leukocytes, UA MODERATE (*)    All other components within normal limits  URINE MICROSCOPIC-ADD ON - Abnormal; Notable for the following:    Squamous Epithelial / LPF 6-30 (*)    Bacteria, UA MANY (*)    Casts GRANULAR CAST (*)    All other components within normal limits  CULTURE, BLOOD (ROUTINE X 2)  CULTURE, BLOOD (ROUTINE X 2)  URINE CULTURE  I-STAT CG4 LACTIC ACID, ED    EKG  EKG Interpretation  Date/Time:  Wednesday February 28 2016 00:24:13 EDT Ventricular Rate:  74 PR Interval:    QRS Duration: 86 QT Interval:  381 QTC Calculation: 423 R Axis:   10 Text Interpretation:  Atrial fibrillation Consider left ventricular hypertrophy Nonspecific ST and T wave abnormality No significant change since last tracing Confirmed by Rhunette Croft, MD, Janey Genta [phone]) on 02/28/2016 12:28:36 AM       Radiology Dg Chest 2 View  Result Date: 02/28/2016 CLINICAL DATA:  RIGHT-sided chest pain tonight.  History of COPD. EXAM: CHEST  2 VIEW COMPARISON:  Chest radiograph February 27, 2016 FINDINGS: Unchanged rounded airspace opacity RIGHT upper lobe. Mild interstitial prominence without pleural effusion. Cardiac silhouette is mildly enlarged. Calcified aortic knob. No pneumothorax. Osteopenia. Old LEFT rib fractures. IMPRESSION: Unchanged RIGHT upper lobe bronchopneumonia. Followup PA and lateral chest X-ray is recommended in 3-4 weeks following trial of antibiotic therapy to ensure  resolution and exclude underlying malignancy. Mild cardiomegaly. Electronically Signed   By: Awilda Metro M.D.   On: 02/28/2016 02:31   Dg Chest 2 View  Result Date: 02/27/2016 CLINICAL DATA:  Fatigue, confusion, right chest pain. EXAM: CHEST  2 VIEW COMPARISON:  11/04/2015 FINDINGS: Rounded opacity noted in the right upper lobe. Given the recent normal appearance of this area, this is likely pneumonia. Underlying mild hyperinflation of the lungs. Heart is mildly enlarged. No confluent opacity on the left or effusion. No acute bony abnormality. IMPRESSION: Rounded opacity in the right upper lobe, likely pneumonia. Followup PA and lateral chest X-ray is recommended in 3-4 weeks following trial of antibiotic therapy to ensure resolution and exclude underlying malignancy. Hyperinflation/COPD.  Cardiomegaly. Electronically Signed   By: Charlett Nose M.D.   On: 02/27/2016 16:00    Procedures .Critical Care Performed by: Derwood Kaplan Authorized by: Derwood Kaplan   Critical care provider statement:    Critical care time (minutes):  40   Critical care was necessary to treat or prevent imminent or life-threatening deterioration of the following conditions:  Sepsis   Critical care was time spent personally by me on the following activities:  Blood draw for specimens, development of treatment plan with patient or surrogate, discussions with consultants, examination of patient, obtaining history from patient or surrogate, ordering and performing treatments and interventions, ordering and review of laboratory studies, ordering and review of radiographic studies, pulse oximetry and re-evaluation of patient's condition   (including critical care time)  Medications Ordered in ED Medications  sodium chloride 0.9 % bolus 1,000 mL (not administered)  cefTRIAXone (ROCEPHIN) 1  g in dextrose 5 % 50 mL IVPB (not administered)  doxycycline (VIBRAMYCIN) 100 mg in dextrose 5 % 250 mL IVPB (not administered)    sodium chloride 0.9 % bolus 1,000 mL (not administered)     Initial Impression / Assessment and Plan / ED Course  I have reviewed the triage vital signs and the nursing notes.  Pertinent labs & imaging results that were available during my care of the patient were reviewed by me and considered in my medical decision making (see chart for details).  Clinical Course  Comment By Time  With elevated WC, low grade temp and other SIRs - lactate and blood cultures ordered - unsure if they are completed. Just reminded charge RN to f/u. PT also has AKI and elevated Cr. UA in the meantime looks infected - we will start ceftriaxone. CXR is equivocal, doxy added to get atypical coverage. Pt is having R sided chest pain with inspiration. Derwood Kaplan, MD 10/04 0303    I personally performed the services described in this documentation, which was scribed in my presence. The recorded information has been reviewed and is accurate.  PT comes in with cc of weakness. DDx: Sepsis syndrome ACS syndrome ICH Infection - pneumonia/UTI/Cellulitis Dehydration Electrolyte abnormality Tox syndrome  PT comes in with weakness.  She reports feeling well. Exam is non focal - except for there is suprapubic tenderness. UTI possible. She also on ROS mentioned some R sided chest pain with inspiration. CXR orderd. Lung exam anteriorly was normal.   Final Clinical Impressions(s) / ED Diagnoses   Final diagnoses:  Lower urinary tract infectious disease  AKI (acute kidney injury) (HCC)  Weakness  Severe sepsis (HCC)    New Prescriptions New Prescriptions   No medications on file        Derwood Kaplan, MD 02/28/16 [id number]

## 2016-02-28 NOTE — Progress Notes (Signed)
MBSS complete. Full report located under chart review in imaging section. Sander Remedios, MA CCC-SLP [phone]  

## 2016-02-28 NOTE — ED Triage Notes (Signed)
Pt comes via [facility], pt has had increased weakness since Monday and decreased mobility. Pt has no complaints, son wanted her to get checked. Pt able to walk with a walker.

## 2016-02-28 NOTE — H&P (Addendum)
History and Physical    CAREL JAILLET OZD:[id number] DOB: 03-24-1932 DOA: 02/28/2016  Referring MD/NP/PA:   PCP: [name], PA-C   Patient coming from:  The patient is coming from home.  At baseline, pt is partially dependent for her ADL.   Chief Complaint: Cough, chest pain, fever, generalized weakness  HPI: Alexis [name] is a 82 y.o. female with medical history significant of hypertension, hyperlipidemia, diabetes mellitus, COPD, depression, anxiety, PAF on Coumadin, GIB, CKD-IV, macular degeneration, poor hearing, who presents with cough, chest pain and generalized weakness.  Patient states that she has dry cough and chest pain in the past 2 days. Chest pain is located in the right chest, mild, constant, sharp, pleuritic, aggravated the pain coughing. She also has fever, generalized weakness. She has mild SOB. She can speak in full sentence. She has increased urinary frequency which she attributes to diuretic use. She denies dysuria or burning on urination. No nausea, vomiting, diarrhea, abdominal pain. No unilateral weakness.   ED Course: pt was found to have WBC 16.7, INR 2.71, lactic acid 0.64, BNP 890,  positive urinalysis with moderate  leukocytosis, worsening renal function, temperature 100, tachycardia, tachypnea, oxygen saturation 91% on room air. Chest x-ray showed right upper lobe infiltration. Patient is admitted to telemetry bed as inpatient.  Review of Systems:   General: has fevers, chills, no changes in body weight, has fatigue HEENT: no blurry vision, hearing changes or sore throat Respiratory: has dyspnea, has coughing, no wheezing CV: has chest pain, no palpitations GI: no nausea, vomiting, abdominal pain, diarrhea, constipation GU: no dysuria, burning on urination, has increased urinary frequency, no hematuria  Ext: no leg edema Neuro: no unilateral weakness, numbness, or tingling, no vision change or hearing loss Skin: no rash, no skin tear. MSK:  No muscle spasm, no deformity, no limitation of range of movement in spin Heme: No easy bruising.  Travel history: No recent long distant travel.  Allergy:  Allergies  Allergen Reactions  . Yellow Dyes (Non-Tartrazine) Nausea And Vomiting    "deathly allergic" No other information given to explain reaction. Per Daughter- this was an IV dye. Ok with yellow coloring on Coumadin tablets.   . Codeine Hives  . Other Other (See Comments)    novocaine broke mouth out  . Penicillins Hives  . Procaine Hives    Past Medical History:  Diagnosis Date  . Anxiety   . Chronic diastolic CHF (congestive heart failure) (HCC)    a. 05/2015 Echo: EF 55-60%, mild LVH, no rwma, mild MR, mildly dil RA, mod TR, PASP .  . CKD (chronic kidney disease), stage IV (HCC)   . Confusion state   . COPD (chronic obstructive pulmonary disease) (HCC)    a. on home O2 @ 3lpm.  . Elevated troponin    a. H/o elevated trop in setting of CHF; b. 06/2013 Lexiscan MV: nl EF, mild anterior breast attenuation, no ischemia.   . Family history of adverse reaction to anesthesia   . GIB (gastrointestinal bleeding)    a. History of GIB.  Marland Kitchen Hypertensive heart disease   . Macular degeneration, bilateral   . Neuropathy (HCC)   . Paroxysmal atrial fibrillation (HCC)    a. CHA2DS2VASc = 6-->coumadin.  Marland Kitchen PONV (postoperative nausea and vomiting)   . Type II diabetes mellitus (HCC)   . Varicose veins     Past Surgical History:  Procedure Laterality Date  . ABDOMINAL HYSTERECTOMY    . CARDIOVERSION N/A 09/22/2015   Procedure:  CARDIOVERSION;  Surgeon: Chrystie Nose, MD;  Location: [facility] ENDOSCOPY;  Service: Cardiovascular;  Laterality: N/A;  . ESOPHAGOGASTRODUODENOSCOPY N/A 12/01/2014   Procedure: ESOPHAGOGASTRODUODENOSCOPY (EGD);  Surgeon: Hilarie Fredrickson, MD;  Location: [facility] ENDOSCOPY;  Service: Endoscopy;  Laterality: N/A;  . HIP FRACTURE SURGERY  2004  . VEIN SURGERY      Social History:  reports that she is a non-smoker but  has been exposed to tobacco smoke. She has never used smokeless tobacco. She reports that she does not drink alcohol or use drugs.  Family History:  Family History  Problem Relation Age of Onset  . Colon cancer Father 14    advanced when discovered, no surgery or therapy.  this led to his death  . Breast cancer Mother   . Hypertension Mother   . Breast cancer Sister   . Cancer Brother   . Lung cancer Brother   . Thyroid disease Neg Hx   . Heart attack Neg Hx      Prior to Admission medications   Medication Sig Start Date End Date Taking? Authorizing Provider  albuterol (PROVENTIL HFA;VENTOLIN HFA) 108 (90 BASE) MCG/ACT inhaler Inhale 2 puffs into the lungs every 6 (six) hours as needed for wheezing or shortness of breath. 06/11/13   Nishant Dhungel, MD  atorvastatin (LIPITOR) 20 MG tablet Take 20 mg by mouth daily.    Historical Provider, MD  cholecalciferol (VITAMIN D) 1000 UNITS tablet Take 1,000 Units by mouth every morning.     Historical Provider, MD  docusate sodium (COLACE) 100 MG capsule Take 100 mg by mouth 2 (two) times daily as needed for mild constipation.    Historical Provider, MD  escitalopram (LEXAPRO) 10 MG tablet Take 10 mg by mouth daily.    Historical Provider, MD  fenofibrate 160 MG tablet Take 160 mg by mouth daily.    Historical Provider, MD  gabapentin (NEURONTIN) 100 MG capsule Take 1 capsule (100 mg total) by mouth at bedtime. 08/05/15   Rodolph Bong, MD  hydrALAZINE (APRESOLINE) 25 MG tablet TAKE 1 TABLET BY MOUTH EVERY 12 HOURS 01/30/16   Chrystie Nose, MD  iron polysaccharides (NIFEREX) 150 MG capsule Take 1 capsule (150 mg total) by mouth daily. 09/22/15   Ripudeep Jenna Luo, MD  isosorbide mononitrate (IMDUR) 30 MG 24 hr tablet TAKE 1 TABLET BY MOUTH EVERY DAY 02/23/16   Chrystie Nose, MD  metoprolol tartrate (LOPRESSOR) 25 MG tablet Take 1 tablet (25 mg total) by mouth 2 (two) times daily. 10/23/15   Campbell Stall, MD  Multiple Vitamin (MULTIVITAMIN WITH  MINERALS) TABS tablet Take 1 tablet by mouth daily.    Historical Provider, MD  OXYGEN Inhale 3 L into the lungs at bedtime.     Historical Provider, MD  polyethylene glycol (MIRALAX / GLYCOLAX) packet Take 17 g by mouth daily as needed for mild constipation.    Historical Provider, MD  potassium chloride SA (K-DUR,KLOR-CON) 20 MEQ tablet Take 20 mEq by mouth daily.    Historical Provider, MD  torsemide (DEMADEX) 20 MG tablet TAKE 2 TABLETS BY MOUTH EVERY DAY 02/19/16   Chrystie Nose, MD  warfarin (COUMADIN) 4 MG tablet Take 1-1.5 tablets by mouth daily as directed by coumadin clinic Patient taking differently: Take 4 mg by mouth daily. by mouth daily as directed by coumadin clinic 12/08/15   Chrystie Nose, MD    Physical Exam: Vitals:   02/28/16 0300 02/28/16 0315 02/28/16 0330 02/28/16 0440  BP: Marland Kitchen)  155/49 (!) 145/53 156/56   Pulse: 75 82 74   Resp:      Temp:      TempSrc:      SpO2: 100% 100% 96% 95%  Weight:      Height:       General: Not in acute distress HEENT:       Eyes: PERRL, EOMI, no scleral icterus.       ENT: No discharge from the ears and nose, no pharynx injection, no tonsillar enlargement.        Neck: No JVD, no bruit, no mass felt. Heme: No neck lymph node enlargement. Cardiac: S1/S2, irregularly irregular rhythm, No murmurs, No gallops or rubs. Respiratory: has rhonchi bilaterally. No rales, wheezing or rubs. GI: Soft, nondistended, has mild tenderness in suprapubic area, no rebound pain, no organomegaly, BS present. GU: No hematuria Ext: No pitting leg edema bilaterally. 2+DP/PT pulse bilaterally. Musculoskeletal: No joint deformities, No joint redness or warmth, no limitation of ROM in spin. Skin: No rashes.  Neuro: Alert, oriented X3, cranial nerves II-XII grossly intact, moves all extremities normally.  Psych: Patient is not psychotic, no suicidal or hemocidal ideation.  Labs on Admission: I have personally reviewed following labs and imaging  studies  CBC:  Recent Labs Lab 02/28/16 0151  WBC 16.7*  NEUTROABS 14.5*  HGB 10.5*  HCT 32.4*  MCV 85.0  PLT 210   Basic Metabolic Panel:  Recent Labs Lab 02/28/16 0151  NA 132*  K 3.8  CL 98*  CO2 24  GLUCOSE 318*  BUN 46*  CREATININE 2.25*  CALCIUM 9.3   GFR: Estimated Creatinine Clearance: 16.6 mL/min (by C-G formula based on SCr of 2.25 mg/dL (H)). Liver Function Tests: No results for input(s): AST, ALT, ALKPHOS, BILITOT, PROT, ALBUMIN in the last 168 hours. No results for input(s): LIPASE, AMYLASE in the last 168 hours. No results for input(s): AMMONIA in the last 168 hours. Coagulation Profile:  Recent Labs Lab 02/26/16 1443 02/28/16 0151  INR 2.6 2.71   Cardiac Enzymes: No results for input(s): CKTOTAL, CKMB, CKMBINDEX, TROPONINI in the last 168 hours. BNP (last 3 results) No results for input(s): PROBNP in the last 8760 hours. HbA1C: No results for input(s): HGBA1C in the last 72 hours. CBG: No results for input(s): GLUCAP in the last 168 hours. Lipid Profile: No results for input(s): CHOL, HDL, LDLCALC, TRIG, CHOLHDL, LDLDIRECT in the last 72 hours. Thyroid Function Tests: No results for input(s): TSH, T4TOTAL, FREET4, T3FREE, THYROIDAB in the last 72 hours. Anemia Panel: No results for input(s): VITAMINB12, FOLATE, FERRITIN, TIBC, IRON, RETICCTPCT in the last 72 hours. Urine analysis:    Component Value Date/Time   COLORURINE YELLOW 02/28/2016 0139   APPEARANCEUR TURBID (A) 02/28/2016 0139   LABSPEC 1.019 02/28/2016 0139   PHURINE 5.5 02/28/2016 0139   GLUCOSEU 250 (A) 02/28/2016 0139   HGBUR TRACE (A) 02/28/2016 0139   BILIRUBINUR NEGATIVE 02/28/2016 0139   KETONESUR NEGATIVE 02/28/2016 0139   PROTEINUR >300 (A) 02/28/2016 0139   UROBILINOGEN 0.2 12/14/2014 1821   NITRITE NEGATIVE 02/28/2016 0139   LEUKOCYTESUR MODERATE (A) 02/28/2016 0139   Sepsis Labs: @LABRCNTIP (procalcitonin:4,lacticidven:4) )No results found for this or any  previous visit (from the past 240 hour(s)).   Radiological Exams on Admission: Dg Chest 2 View  Result Date: 02/28/2016 CLINICAL DATA:  RIGHT-sided chest pain tonight.  History of COPD. EXAM: CHEST  2 VIEW COMPARISON:  Chest radiograph February 27, 2016 FINDINGS: Unchanged rounded airspace opacity RIGHT upper lobe. Mild  interstitial prominence without pleural effusion. Cardiac silhouette is mildly enlarged. Calcified aortic knob. No pneumothorax. Osteopenia. Old LEFT rib fractures. IMPRESSION: Unchanged RIGHT upper lobe bronchopneumonia. Followup PA and lateral chest X-ray is recommended in 3-4 weeks following trial of antibiotic therapy to ensure resolution and exclude underlying malignancy. Mild cardiomegaly. Electronically Signed   By: Awilda Metro M.D.   On: 02/28/2016 02:31   Dg Chest 2 View  Result Date: 02/27/2016 CLINICAL DATA:  Fatigue, confusion, right chest pain. EXAM: CHEST  2 VIEW COMPARISON:  11/04/2015 FINDINGS: Rounded opacity noted in the right upper lobe. Given the recent normal appearance of this area, this is likely pneumonia. Underlying mild hyperinflation of the lungs. Heart is mildly enlarged. No confluent opacity on the left or effusion. No acute bony abnormality. IMPRESSION: Rounded opacity in the right upper lobe, likely pneumonia. Followup PA and lateral chest X-ray is recommended in 3-4 weeks following trial of antibiotic therapy to ensure resolution and exclude underlying malignancy. Hyperinflation/COPD.  Cardiomegaly. Electronically Signed   By: Charlett Nose M.D.   On: 02/27/2016 16:00     EKG: Independently reviewed. Atrial fibrillation, QTC 423, nonspecific T-wave change.   Assessment/Plan Principal Problem:   Sepsis (HCC) Active Problems:   Chronic diastolic heart failure, NYHA class 1 (HCC)   Essential hypertension   (HFpEF) heart failure with preserved ejection fraction (HCC)   COPD (chronic obstructive pulmonary disease) (HCC)   Long-term (current) use of  anticoagulants   Acute renal failure superimposed on stage 4 chronic kidney disease (HCC)   UTI (urinary tract infection)   Diabetes mellitus with complication (HCC)   Atrial fibrillation, persistent (HCC)   CAP (community acquired pneumonia)   Depression with anxiety   HLD (hyperlipidemia)   Sepsis due to CAP and UTI: Patient admitted criteria for sepsis with leukocytosis, fever, tachycardia, tachypnea. She has pleuritic chest pain, cough and shortness of breath, plus chest x-ray findings, consistent with CAP. She has positive urinalysis, increased frequency and mild tenderness in suprapubic area, consistent with UTI. Currently hemodynamically stable. Lactic acid 0.64  - Will admit to telemetry bed as inpt (his patient has multiple chronic comorbidities including dHCF, PAF, CKD-IV, DM. Now patient has sepsis and worsening renal function. I certify that at the point of admission it is my clinical judgment that the patient will require inpatient hospital care spanning beyond 2 midnights from the point of admission)  - IV Rocephin and doxycycline - Robitussin for cough  - prn Albuterol Nebs, DuoNeb for SOB - Urine legionella and S. pneumococcal antigen - Follow up blood culture x2, sputum culture and respiratory virus panel - will get Procalcitonin and trend lactic acid level per sepsis protocol - IVF: 2L of NS bolus in ED, will d/c IVF since patient has a diastolic congestive heart failure, BNP 890, at risk of developing CHF exacerbation. - f/u Bx and Ux  COPD (chronic obstructive pulmonary disease): pt has rhonchi bilaterally, but no productive cough. Patient may have a mild COPD exacerbation -Breathing treatment as above  Chronic diastolic heart failure, NYHA class 1: no leg edema or JVD. BNP 890, but clinically patient does not seem to have CHF exacerbation currently. -Hold torsemide due to sepsis, will resume torsemide as soon as sepsis is controled -Continue Toprol and  Imdur  HTN: -Continue hydralazine and metoprolol  Atrial Fibrillation: CHA2DS2-VASc Score is 6, needs oral anticoagulation. Patient is on Coumadin at home. INR is 2.71  on admission. Heart rate is 112-->82. -continue coumadin per pharm -continue metoprolol  AoCKD-IV: Baseline Cre is 1.7-1.8, pt's Cre is 2.25 and BUN 26 on admission. Likely due to prerenal secondary to dehydration and continuation of diruetics. UTI may have also contributed partially. - IVF as above - Follow up renal function by BMP - Hold Diuretics, torsemide   DM-II: Last A1c 8.10/20/15, poorly controled. Patient used to take glucoside, which was discontinued due to frequent hypoglycemia  -SSI  Depression and anxiety: Stable, no suicidal or homicidal ideations. -Continue home medications: Lexapro  HLD: Last LDL 72 and TG 62 on 10/22/15 -Continue home medications: Lipitor and fenofibrate   Sepsis - reassessment   Performed at:    5:36 AM   Last Vitals:    Blood pressure (!) 145/53, pulse 82, temperature 100 F (37.8 C), temperature source Oral, resp. rate 21, height 5\' 4"  (1.626 m), weight 61.2 kg (135 lb), SpO2 100 %.  Heart:                 Irregularly irregular rhythm, Tachchycarida  Lungs:     Has rhonchi bilaterally  Capillary Refill:   <2 seconds    Peripheral Pulse (include location): Brachial pulse palpable   Skin (include color):              Normal   DVT ppx: on Coumadin Code Status: DNR Family Communication: Yes, patient's daughter at bed side Disposition Plan:  Anticipate discharge back to previous home environment Consults called:  none Admission status:  Inpatient/tele  Date of Service 02/28/2016    Lorretta Harp [facility] Pager [phone]  If 7PM-7AM, please contact night-coverage [email or web address] Password [email or web address] 02/28/2016, 5:36 AM

## 2016-02-28 NOTE — Evaluation (Signed)
Physical Therapy Evaluation Patient Details Name: Alexis GivensChristine E [name] MRN: [id number] DOB: 05/12/1932 Today's Date: 02/28/2016   History of Present Illness  Alexis [name] is a 82 y.o. female with medical history significant of hypertension, hyperlipidemia, diabetes mellitus, COPD, depression, anxiety, PAF on Coumadin, GIB, CKD-IV, macular degeneration, poor hearing, who presents with cough, chest pain and generalized weakness.  Clinical Impression  Pt admitted with above diagnosis. Pt currently with functional limitations due to the deficits listed below (see PT Problem List).  Pt will benefit from skilled PT to increase their independence and safety with mobility to allow discharge to the venue listed below.       Follow Up Recommendations Home health PT;Supervision/Assistance - 24 hour; She tells me she has had HHPT multiple times and "knows what to do"; will likely decline    Equipment Recommendations  None recommended by PT (pretty well-equipped)    Recommendations for Other Services OT consult     Precautions / Restrictions Precautions Precautions: Fall Restrictions Weight Bearing Restrictions: No      Mobility  Bed Mobility Overal bed mobility: Needs Assistance Bed Mobility: Supine to Sit     Supine to sit: Min assist     General bed mobility comments: min handheld assist to pull to sit  Transfers Overall transfer level: Needs assistance Equipment used: 2 person hand held assist Transfers: Sit to/from Stand Sit to Stand: Mod assist         General transfer comment: Light mod assist to power up to sit  Ambulation/Gait Ambulation/Gait assistance: Min assist Ambulation Distance (Feet): 5 Feet Assistive device: 2 person hand held assist Gait Pattern/deviations: Step-through pattern;Shuffle     General Gait Details: Cues to self-monitor for activity tolerance  Stairs            Wheelchair Mobility    Modified Rankin (Stroke Patients  Only)       Balance                                             Pertinent Vitals/Pain Pain Assessment: No/denies pain    Home Living Family/patient expects to be discharged to:: Private residence Living Arrangements: Children Available Help at Discharge: Family;Available 24 hours/day Type of Home: House Home Access: Level entry     Home Layout: One level Home Equipment: Shower seat;Bedside commode;Walker - 4 wheels;Other (comment);Wheelchair - manual Additional Comments: Pt sleeps in recliner    Prior Function Level of Independence: Independent with assistive device(s)         Comments: hx of falls; uses 3L O2 at night and prn during day     Hand Dominance   Dominant Hand: Right    Extremity/Trunk Assessment   Upper Extremity Assessment: Overall WFL for tasks assessed           Lower Extremity Assessment: Generalized weakness         Communication   Communication: HOH  Cognition Arousal/Alertness: Awake/alert Behavior During Therapy: WFL for tasks assessed/performed Overall Cognitive Status: Within Functional Limits for tasks assessed                      General Comments General comments (skin integrity, edema, etc.): Session conducted on 2 L O2, sats remained greater than or equal to 95%    Exercises     Assessment/Plan    PT Assessment Patient needs continued  PT services  PT Problem List Decreased strength;Decreased activity tolerance;Decreased balance;Decreased mobility;Decreased knowledge of use of DME;Cardiopulmonary status limiting activity          PT Treatment Interventions DME instruction;Gait training;Functional mobility training;Therapeutic activities;Therapeutic exercise;Patient/family education    PT Goals (Current goals can be found in the Care Plan section)  Acute Rehab PT Goals Patient Stated Goal: to feel better PT Goal Formulation: With patient Time For Goal Achievement: 03/13/16 Potential to  Achieve Goals: Good    Frequency Min 3X/week   Barriers to discharge        Co-evaluation               End of Session Equipment Utilized During Treatment: Gait belt;Oxygen Activity Tolerance: Patient tolerated treatment well Patient left: in chair;with call bell/phone within reach;with chair alarm set Nurse Communication: Mobility status         Time: 7209-4709 PT Time Calculation (min) (ACUTE ONLY): 15 min   Charges:   PT Evaluation $PT Eval Moderate Complexity: 1 Procedure     PT G CodesVan Clines Hamff 02/28/2016, 11:15 AM  Van Clines, PT  Acute Rehabilitation Services Pager [phone] Office [phone]

## 2016-02-29 DIAGNOSIS — N390 Urinary tract infection, site not specified: Secondary | ICD-10-CM

## 2016-02-29 DIAGNOSIS — I10 Essential (primary) hypertension: Secondary | ICD-10-CM

## 2016-02-29 DIAGNOSIS — A419 Sepsis, unspecified organism: Principal | ICD-10-CM

## 2016-02-29 DIAGNOSIS — J438 Other emphysema: Secondary | ICD-10-CM

## 2016-02-29 DIAGNOSIS — N184 Chronic kidney disease, stage 4 (severe): Secondary | ICD-10-CM

## 2016-02-29 DIAGNOSIS — I481 Persistent atrial fibrillation: Secondary | ICD-10-CM

## 2016-02-29 DIAGNOSIS — J181 Lobar pneumonia, unspecified organism: Secondary | ICD-10-CM

## 2016-02-29 DIAGNOSIS — Z7901 Long term (current) use of anticoagulants: Secondary | ICD-10-CM

## 2016-02-29 DIAGNOSIS — I5030 Unspecified diastolic (congestive) heart failure: Secondary | ICD-10-CM

## 2016-02-29 LAB — UREA NITROGEN, URINE: UREA NITROGEN UR: 595 mg/dL

## 2016-02-29 LAB — BASIC METABOLIC PANEL
ANION GAP: 13 (ref 5–15)
BUN: 43 mg/dL — ABNORMAL HIGH (ref 6–20)
CALCIUM: 8.8 mg/dL — AB (ref 8.9–10.3)
CO2: 24 mmol/L (ref 22–32)
Chloride: 99 mmol/L — ABNORMAL LOW (ref 101–111)
Creatinine, Ser: 2.02 mg/dL — ABNORMAL HIGH (ref 0.44–1.00)
GFR, EST AFRICAN AMERICAN: 25 mL/min — AB (ref >60)
GFR, EST NON AFRICAN AMERICAN: 22 mL/min — AB (ref >60)
Glucose, Bld: 188 mg/dL — ABNORMAL HIGH (ref 65–99)
Potassium: 3.7 mmol/L (ref 3.5–5.1)
SODIUM: 136 mmol/L (ref 135–145)

## 2016-02-29 LAB — PROTIME-INR
INR: 4.46
PROTHROMBIN TIME: 43.6 s — AB (ref 11.4–15.2)

## 2016-02-29 LAB — URINE CULTURE

## 2016-02-29 LAB — CBC
HCT: 27.7 % — ABNORMAL LOW (ref 36.0–46.0)
HEMOGLOBIN: 8.8 g/dL — AB (ref 12.0–15.0)
MCH: 27.1 pg (ref 26.0–34.0)
MCHC: 31.8 g/dL (ref 30.0–36.0)
MCV: 85.2 fL (ref 78.0–100.0)
Platelets: 188 10*3/uL (ref 150–400)
RBC: 3.25 MIL/uL — AB (ref 3.87–5.11)
RDW: 15.2 % (ref 11.5–15.5)
WBC: 13.3 10*3/uL — AB (ref 4.0–10.5)

## 2016-02-29 LAB — GLUCOSE, CAPILLARY
GLUCOSE-CAPILLARY: 180 mg/dL — AB (ref 65–99)
GLUCOSE-CAPILLARY: 284 mg/dL — AB (ref 65–99)
GLUCOSE-CAPILLARY: 298 mg/dL — AB (ref 65–99)
Glucose-Capillary: 289 mg/dL — ABNORMAL HIGH (ref 65–99)

## 2016-02-29 MED ORDER — ORAL CARE MOUTH RINSE
15.0000 mL | Freq: Two times a day (BID) | OROMUCOSAL | Status: DC
  Administered 2016-03-01 – 2016-03-03 (×6): 15 mL via OROMUCOSAL

## 2016-02-29 NOTE — Progress Notes (Signed)
Results for JNAI, FAUVER (MRN [id number]) as of 02/29/2016 07:22  Ref. Range 02/29/2016 05:05  INR Unknown 4.46 (HH)  Will Update MD.

## 2016-02-29 NOTE — Progress Notes (Signed)
Occupational Therapy Treatment Patient Details Name: Alexis [name] MRN: [id number] DOB: 06/16/1931 Today's Date: 02/29/2016    History of present illness Alexis [name] is a 82 y.o. female with medical history significant of hypertension, hyperlipidemia, diabetes mellitus, COPD, depression, anxiety, PAF on Coumadin, GIB, CKD-IV, macular degeneration, poor hearing, who presents with cough, chest pain and generalized weakness.   OT comments  Pt continues to be a high fall risk with posterior LOB noted requiring min assist to steady. Pt will need close supervision at home with assist for all mobility.   Follow Up Recommendations  Home health OT;Supervision/Assistance - 24 hour    Equipment Recommendations  None recommended by OT    Recommendations for Other Services      Precautions / Restrictions Precautions Precautions: Fall       Mobility Bed Mobility Overal bed mobility: Needs Assistance Bed Mobility: Sit to Supine       Sit to supine: Min assist   General bed mobility comments: assist for LEs up into bed  Transfers Overall transfer level: Needs assistance Equipment used: Rolling walker (2 wheeled) Transfers: Sit to/from Stand Sit to Stand: Min assist         General transfer comment: posterior LOB, min assist to correct, cues for hand placement    Balance Overall balance assessment: Needs assistance   Sitting balance-Leahy Scale: Fair       Standing balance-Leahy Scale: Poor                     ADL Overall ADL's : Needs assistance/impaired     Grooming: Wash/dry hands;Standing;Minimal assistance           Upper Body Dressing : Minimal assistance;Standing Upper Body Dressing Details (indicate cue type and reason): doffed front opening gown     Toilet Transfer: Minimal assistance;Ambulation;BSC   Toileting- Clothing Manipulation and Hygiene: Minimal assistance;Sit to/from stand       Functional mobility during ADLs:  Minimal assistance;Rolling walker General ADL Comments: Pt fatigued, wanting to return to bed.      Vision                     Perception     Praxis      Cognition   Behavior During Therapy: WFL for tasks assessed/performed Overall Cognitive Status: History of cognitive impairments - at baseline       Memory: Decreased short-term memory               Extremity/Trunk Assessment               Exercises     Shoulder Instructions       General Comments      Pertinent Vitals/ Pain       Pain Assessment: No/denies pain  Home Living                                          Prior Functioning/Environment              Frequency  Min 2X/week        Progress Toward Goals  OT Goals(current goals can now be found in the care plan section)  Progress towards OT goals: Progressing toward goals  Acute Rehab OT Goals Time For Goal Achievement: 03/13/16 Potential to Achieve Goals: Good  Plan Discharge plan remains appropriate    Co-evaluation  End of Session Equipment Utilized During Treatment: Gait belt;Oxygen;Rolling walker   Activity Tolerance Patient tolerated treatment well   Patient Left in bed;with bed alarm set;with call bell/phone within reach;with family/visitor present   Nurse Communication          Time: 5176-1607 OT Time Calculation (min): 26 min  Charges: OT General Charges $OT Visit: 1 Procedure OT Treatments $Self Care/Home Management : 23-37 mins  Evern Bio 02/29/2016, 3:34 PM  [phone]

## 2016-02-29 NOTE — Discharge Instructions (Addendum)
Please take all your medications with you for your next visit with your Primary MD. °Please request your Primary MD to go over all hospital test results at the follow up. Please ask your Primary MD to get all Hospital records sent to his/her office. °  °If you experience worsening of your admission symptoms, develop shortness of breath, chest pain, suicidal or homicidal thoughts or a life threatening emergency, you must seek medical attention immediately by calling 911 or calling your MD. °  °You must read the complete instructions/literature along with all the possible adverse reactions/side effects for all the medicines you take including new medications that have been prescribed to you. Take new medicines after you have completely understood and accpet all the possible adverse reactions/side effects.  °  °Do not drive when taking pain medications or sedatives.  °   °Do not take more than prescribed Pain, Sleep and Anxiety Medications °  °If you have smoked or chewed Tobacco in the last 2 yrs please stop. Stop any regular alcohol  and or recreational drug use. °  °Wear Seat belts while driving. ° ° °Information on my medicine - Coumadin®   (Warfarin) ° °This medication education was reviewed with me or my healthcare representative as part of my discharge preparation.   ° °Why was Coumadin prescribed for you? °Coumadin was prescribed for you because you have a blood clot or a medical condition that can cause an increased risk of forming blood clots. Blood clots can cause serious health problems by blocking the flow of blood to the heart, lung, or brain. Coumadin can prevent harmful blood clots from forming. °As a reminder your indication for Coumadin is:   Stroke Prevention Because Of Atrial Fibrillation ° °What test will check on my response to Coumadin? °While on Coumadin (warfarin) you will need to have an INR test regularly to ensure that your dose is keeping you in the desired range. The INR (international  normalized ratio) number is calculated from the result of the laboratory test called prothrombin time (PT). ° °If an INR APPOINTMENT HAS NOT ALREADY BEEN MADE FOR YOU please schedule an appointment to have this lab work done by your health care provider within 7 days. °Your INR goal is usually a number between:  2 to 3 or your provider may give you a more narrow range like 2-2.5.  Ask your health care provider during an office visit what your goal INR is. ° °What  do you need to  know  About  COUMADIN? °Take Coumadin (warfarin) exactly as prescribed by your healthcare provider about the same time each day.  DO NOT stop taking without talking to the doctor who prescribed the medication.  Stopping without other blood clot prevention medication to take the place of Coumadin may increase your risk of developing a new clot or stroke.  Get refills before you run out. ° °What do you do if you miss a dose? °If you miss a dose, take it as soon as you remember on the same day then continue your regularly scheduled regimen the next day.  Do not take two doses of Coumadin at the same time. ° °Important Safety Information °A possible side effect of Coumadin (Warfarin) is an increased risk of bleeding. You should call your healthcare provider right away if you experience any of the following: °? Bleeding from an injury or your nose that does not stop. °? Unusual colored urine (red or dark brown) or unusual colored stools (red or black). °? Unusual bruising for unknown reasons. °?   A serious fall or if you hit your head (even if there is no bleeding). ° °Some foods or medicines interact with Coumadin® (warfarin) and might alter your response to warfarin. To help avoid this: °? Eat a balanced diet, maintaining a consistent amount of Vitamin K. °? Notify your provider about major diet changes you plan to make. °? Avoid alcohol or limit your intake to 1 drink for women and 2 drinks for men per day. °(1 drink is 5 oz. wine, 12 oz.  beer, or 1.5 oz. liquor.) ° °Make sure that ANY health care provider who prescribes medication for you knows that you are taking Coumadin (warfarin).  Also make sure the healthcare provider who is monitoring your Coumadin knows when you have started a new medication including herbals and non-prescription products. ° °Coumadin® (Warfarin)  Major Drug Interactions  °Increased Warfarin Effect Decreased Warfarin Effect  °Alcohol (large quantities) °Antibiotics (esp. Septra/Bactrim, Flagyl, Cipro) °Amiodarone (Cordarone) °Aspirin (ASA) °Cimetidine (Tagamet) °Megestrol (Megace) °NSAIDs (ibuprofen, naproxen, etc.) °Piroxicam (Feldene) °Propafenone (Rythmol SR) °Propranolol (Inderal) °Isoniazid (INH) °Posaconazole (Noxafil) Barbiturates (Phenobarbital) °Carbamazepine (Tegretol) °Chlordiazepoxide (Librium) °Cholestyramine (Questran) °Griseofulvin °Oral Contraceptives °Rifampin °Sucralfate (Carafate) °Vitamin K  ° °Coumadin® (Warfarin) Major Herbal Interactions  °Increased Warfarin Effect Decreased Warfarin Effect  °Garlic °Ginseng °Ginkgo biloba Coenzyme Q10 °Green tea °St. John’s wort   ° °Coumadin® (Warfarin) FOOD Interactions  °Eat a consistent number of servings per week of foods HIGH in Vitamin K °(1 serving = ½ cup)  °Collards (cooked, or boiled & drained) °Kale (cooked, or boiled & drained) °Mustard greens (cooked, or boiled & drained) °Parsley *serving size only = ¼ cup °Spinach (cooked, or boiled & drained) °Swiss chard (cooked, or boiled & drained) °Turnip greens (cooked, or boiled & drained)  °Eat a consistent number of servings per week of foods MEDIUM-HIGH in Vitamin K °(1 serving = 1 cup)  °Asparagus (cooked, or boiled & drained) °Broccoli (cooked, boiled & drained, or raw & chopped) °Brussel sprouts (cooked, or boiled & drained) *serving size only = ½ cup °Lettuce, raw (green leaf, endive, romaine) °Spinach, raw °Turnip greens, raw & chopped  ° °These websites have more information on Coumadin (warfarin):   www.coumadin.com; °www.ahrq.gov/consumer/coumadin.htm; ° ° °

## 2016-02-29 NOTE — Progress Notes (Signed)
ANTICOAGULATION CONSULT NOTE - Initial Consult  Pharmacy Consult for Coumadin Indication: atrial fibrillation  Allergies  Allergen Reactions  . Yellow Dyes (Non-Tartrazine) Nausea And Vomiting    "deathly allergic" No other information given to explain reaction. Per Daughter- this was an IV dye. Ok with yellow coloring on Coumadin tablets.   . Codeine Hives  . Other Other (See Comments)    novocaine broke mouth out  . Penicillins Hives  . Procaine Hives   Patient Measurements: Height: 5\' 4"  (162.6 cm) Weight: 135 lb 2.3 oz (61.3 kg) IBW/kg (Calculated) : 54.7  Vital Signs: Temp: 98.7 F (37.1 C) (10/05 0950) Temp Source: Oral (10/05 0950) BP: 130/40 (10/05 0950) Pulse Rate: 91 (10/05 0950)  Labs:  Recent Labs  02/26/16 1443 02/28/16 0151 02/29/16 0505  HGB  --  10.5* 8.8*  HCT  --  32.4* 27.7*  PLT  --  210 188  LABPROT  --  29.3* 43.6*  INR 2.6 2.71 4.46*  CREATININE  --  2.25* 2.02*   Estimated Creatinine Clearance: 18.5 mL/min (by C-G formula based on SCr of 2.02 mg/dL (H)).  Medical History: Past Medical History:  Diagnosis Date  . Anxiety   . Chronic diastolic CHF (congestive heart failure) (HCC)    a. 05/2015 Echo: EF 55-60%, mild LVH, no rwma, mild MR, mildly dil RA, mod TR, PASP .  . CKD (chronic kidney disease), stage IV (HCC)   . Confusion state   . COPD (chronic obstructive pulmonary disease) (HCC)    a. on home O2 @ 3lpm.  . Elevated troponin    a. H/o elevated trop in setting of CHF; b. 06/2013 Lexiscan MV: nl EF, mild anterior breast attenuation, no ischemia.   . Family history of adverse reaction to anesthesia   . GIB (gastrointestinal bleeding)    a. History of GIB.  Marland Kitchen Hypertensive heart disease   . Macular degeneration, bilateral   . Neuropathy (HCC)   . Paroxysmal atrial fibrillation (HCC)    a. CHA2DS2VASc = 6-->coumadin.  Marland Kitchen PONV (postoperative nausea and vomiting)   . Type II diabetes mellitus (HCC)   . Varicose veins     Medications:  No current facility-administered medications on file prior to encounter.    Current Outpatient Prescriptions on File Prior to Encounter  Medication Sig Dispense Refill  . albuterol (PROVENTIL HFA;VENTOLIN HFA) 108 (90 BASE) MCG/ACT inhaler Inhale 2 puffs into the lungs every 6 (six) hours as needed for wheezing or shortness of breath. 1 Inhaler 3  . atorvastatin (LIPITOR) 20 MG tablet Take 20 mg by mouth daily.    . cholecalciferol (VITAMIN D) 1000 UNITS tablet Take 1,000 Units by mouth every morning.     . docusate sodium (COLACE) 100 MG capsule Take 100 mg by mouth 2 (two) times daily as needed for mild constipation.    Marland Kitchen escitalopram (LEXAPRO) 10 MG tablet Take 10 mg by mouth daily.    . fenofibrate 160 MG tablet Take 160 mg by mouth daily.    Marland Kitchen gabapentin (NEURONTIN) 100 MG capsule Take 1 capsule (100 mg total) by mouth at bedtime. 30 capsule 0  . hydrALAZINE (APRESOLINE) 25 MG tablet TAKE 1 TABLET BY MOUTH EVERY 12 HOURS 60 tablet 8  . iron polysaccharides (NIFEREX) 150 MG capsule Take 1 capsule (150 mg total) by mouth daily. 30 capsule 3  . isosorbide mononitrate (IMDUR) 30 MG 24 hr tablet TAKE 1 TABLET BY MOUTH EVERY DAY 30 tablet 1  . metoprolol tartrate (LOPRESSOR) 25  MG tablet Take 1 tablet (25 mg total) by mouth 2 (two) times daily. 60 tablet 0  . Multiple Vitamin (MULTIVITAMIN WITH MINERALS) TABS tablet Take 1 tablet by mouth daily.    . OXYGEN Inhale 3 L into the lungs at bedtime.     . polyethylene glycol (MIRALAX / GLYCOLAX) packet Take 17 g by mouth daily as needed for mild constipation.    . potassium chloride SA (K-DUR,KLOR-CON) 20 MEQ tablet Take 20 mEq by mouth daily.    Marland Kitchen. torsemide (DEMADEX) 20 MG tablet TAKE 2 TABLETS BY MOUTH EVERY DAY 60 tablet 2  . warfarin (COUMADIN) 4 MG tablet Take 1-1.5 tablets by mouth daily as directed by coumadin clinic (Patient taking differently: Take 4 mg by mouth daily. by mouth daily as directed by coumadin clinic) 40 tablet  4    Assessment: 82 y.o. female admitted with UTI, h/o Afib, to continue Coumadin. INR was therapeutic 2.71 at time of admission. Her home regimen was resumed ant patient received warfarin 6mg  last night. Today patient's INR is supra-therapeutic at 4.46. There was a noted drop in HgB 10.5 to 8.8, however patient received 3 liters of fluid and the drop may be dilutional. Nurse and provider are both aware and no bleeding has been reported. Pharmacy will continue to monitor the patient closely.   Prior to admission: Coumadin 6 mg MWF, 4 mg TTSS   Goal of Therapy:  INR 2-3 Monitor platelets by anticoagulation protocol: Yes   Plan:  Hold warfarin until INR <3.0 Daily INR, CBC  Ruben Imony Tatiana Courter, PharmD Clinical Pharmacist Pager: [phone] 02/29/2016 1:20 PM

## 2016-02-29 NOTE — Progress Notes (Signed)
PROGRESS NOTE    Alexis [name]  YQM:[id number] DOB: 08/10/1931 DOA: 02/28/2016  PCP: Ladora DanielBEAL, [name], PA-C   Brief Narrative:  Alexis [name] is a 82 y.o. female with medical history significant of hypertension, hyperlipidemia, diabetes mellitus, COPD, depression, anxiety, PAF on Coumadin, GIB, CKD-IV, macular degeneration, poor hearing, who presents with cough, chest pain and generalized weakness. Her son also states that she has been confused.   Subjective: No complaints of cough or dyspnea.   Assessment & Plan:  Sepsis/ CAP and UTI - cont Rocephin and Doxycycline-blood cultures NGTD, legionella pending-  strep antigen negative  - Resp viral panel negative - U culture unhelpful as it shows multiple species - as infitrate is RUL, have ordered an MBS to ensure she has no aspiration issues- awaiting for it to be read  Active Problems:   Chronic diastolic heart failure, NYHA class 1 - compensated- holding Torsemide as Cr still up form baseline- weight stable -cont Toprol - follows with [facility]    Acute renal failure superimposed on stage 4 chronic kidney disease (HCC) - baseline Cr ranges from 1.7 to 2.25  - holding Torsemide     Essential hypertension with hypertensive heart disease - Toprol / Imdur    Atrial fibrillation, persistent - s/p cardioversion CHA2DS2-VASc Score 6 -Coumadin/ Toprol - HR stable  Supratherapeutic INR - pharmacy managing coumadin    COPD (chronic obstructive pulmonary disease)  - stable - cont nebs  - on 3 L O2 at home  AOCD - stable  Diabetes mellitus  -cont ISS- Hb A1c 8.5 on 4/17- does not appear to be on medications at home - recheck Hb A1c - increased SSI      Depression with anxiety - Lexapro  Pressure injury of skin-stage 2  - cont wound care for pressure ulcer  HLD  -Continue home medications: Lipitor and fenofibrate    DVT prophylaxis: Coumadin Code Status: DNR Family Communication: sone Disposition  Plan: home when stable Consultants:    Procedures:    Antimicrobials:  Anti-infectives    Start     Dose/Rate Route Frequency Ordered Stop   02/29/16 0600  cefTRIAXone (ROCEPHIN) 1 g in dextrose 5 % 50 mL IVPB     1 g 100 mL/hr over 30 Minutes Intravenous Every 24 hours 02/28/16 0345 03/07/16 0559   02/28/16 0500  doxycycline (VIBRA-TABS) tablet 100 mg     100 mg Oral Every 12 hours 02/28/16 0345 03/06/16 0959   02/28/16 0345  cefTRIAXone (ROCEPHIN) 1 g in dextrose 5 % 50 mL IVPB  Status:  Discontinued     1 g 100 mL/hr over 30 Minutes Intravenous Every 24 hours 02/28/16 0341 02/28/16 0400   02/28/16 0330  doxycycline (VIBRAMYCIN) 100 mg in dextrose 5 % 250 mL IVPB  Status:  Discontinued     100 mg 125 mL/hr over 120 Minutes Intravenous  Once 02/28/16 0302 02/28/16 0401   02/28/16 0315  cefTRIAXone (ROCEPHIN) 1 g in dextrose 5 % 50 mL IVPB     1 g 100 mL/hr over 30 Minutes Intravenous  Once 02/28/16 0302 02/28/16 0444       Objective: Vitals:   02/29/16 0524 02/29/16 0814 02/29/16 0950 02/29/16 1506  BP: (!) 146/53  (!) 130/40   Pulse: 80  91   Resp: 20  18   Temp: 98.5 F (36.9 C)  98.7 F (37.1 C)   TempSrc: Oral  Oral   SpO2: 97% 99% 99% 97%  Weight:  Height:        Intake/Output Summary (Last 24 hours) at 02/29/16 1559 Last data filed at 02/29/16 1510  Gross per 24 hour  Intake              890 ml  Output              151 ml  Net              739 ml   Filed Weights   02/28/16 0025 02/28/16 0619 02/28/16 2123  Weight: 61.2 kg (135 lb) 61.3 kg (135 lb 2.3 oz) 61.3 kg (135 lb 2.3 oz)    Examination: General exam: Appears comfortable  HEENT: PERRLA, oral mucosa moist, no sclera icterus or thrush Respiratory system: Clear to auscultation. Respiratory effort normal. Cardiovascular system: S1 & S2 heard, RRR.  No murmurs  Gastrointestinal system: Abdomen soft, non-tender, nondistended. Normal bowel sound. No organomegaly Central nervous system: Alert and  oriented. No focal neurological deficits. Extremities: No cyanosis, clubbing or edema Skin: No rashes or ulcers Psychiatry:  Mood & affect appropriate.     Data Reviewed: I have personally reviewed following labs and imaging studies  CBC:  Recent Labs Lab 02/28/16 0151 02/29/16 0505  WBC 16.7* 13.3*  NEUTROABS 14.5*  --   HGB 10.5* 8.8*  HCT 32.4* 27.7*  MCV 85.0 85.2  PLT 210 188   Basic Metabolic Panel:  Recent Labs Lab 02/28/16 0151 02/29/16 0505  NA 132* 136  K 3.8 3.7  CL 98* 99*  CO2 24 24  GLUCOSE 318* 188*  BUN 46* 43*  CREATININE 2.25* 2.02*  CALCIUM 9.3 8.8*   GFR: Estimated Creatinine Clearance: 18.5 mL/min (by C-G formula based on SCr of 2.02 mg/dL (H)). Liver Function Tests: No results for input(s): AST, ALT, ALKPHOS, BILITOT, PROT, ALBUMIN in the last 168 hours. No results for input(s): LIPASE, AMYLASE in the last 168 hours. No results for input(s): AMMONIA in the last 168 hours. Coagulation Profile:  Recent Labs Lab 02/26/16 1443 02/28/16 0151 02/29/16 0505  INR 2.6 2.71 4.46*   Cardiac Enzymes: No results for input(s): CKTOTAL, CKMB, CKMBINDEX, TROPONINI in the last 168 hours. BNP (last 3 results) No results for input(s): PROBNP in the last 8760 hours. HbA1C: No results for input(s): HGBA1C in the last 72 hours. CBG:  Recent Labs Lab 02/28/16 1138 02/28/16 1614 02/28/16 2132 02/29/16 0748 02/29/16 1214  GLUCAP 367* 299* 253* 180* 284*   Lipid Profile: No results for input(s): CHOL, HDL, LDLCALC, TRIG, CHOLHDL, LDLDIRECT in the last 72 hours. Thyroid Function Tests: No results for input(s): TSH, T4TOTAL, FREET4, T3FREE, THYROIDAB in the last 72 hours. Anemia Panel: No results for input(s): VITAMINB12, FOLATE, FERRITIN, TIBC, IRON, RETICCTPCT in the last 72 hours. Urine analysis:    Component Value Date/Time   COLORURINE YELLOW 02/28/2016 0139   APPEARANCEUR TURBID (A) 02/28/2016 0139   LABSPEC 1.019 02/28/2016 0139    PHURINE 5.5 02/28/2016 0139   GLUCOSEU 250 (A) 02/28/2016 0139   HGBUR TRACE (A) 02/28/2016 0139   BILIRUBINUR NEGATIVE 02/28/2016 0139   KETONESUR NEGATIVE 02/28/2016 0139   PROTEINUR >300 (A) 02/28/2016 0139   UROBILINOGEN 0.2 12/14/2014 1821   NITRITE NEGATIVE 02/28/2016 0139   LEUKOCYTESUR MODERATE (A) 02/28/2016 0139   Sepsis Labs: @LABRCNTIP (procalcitonin:4,lacticidven:4) ) Recent Results (from the past 240 hour(s))  Blood culture (routine x 2)     Status: None (Preliminary result)   Collection Time: 02/28/16  3:06 AM  Result Value Ref Range Status  Specimen Description BLOOD RIGHT FOREARM  Final   Special Requests IN PEDIATRIC BOTTLE  Final   Culture NO GROWTH 1 DAY  Final   Report Status PENDING  Incomplete  Urine culture     Status: Abnormal   Collection Time: 02/28/16  3:08 AM  Result Value Ref Range Status   Specimen Description URINE, CLEAN CATCH  Final   Special Requests NONE  Final   Culture MULTIPLE SPECIES PRESENT, SUGGEST RECOLLECTION (A)  Final   Report Status 02/29/2016 FINAL  Final  Blood culture (routine x 2)     Status: None (Preliminary result)   Collection Time: 02/28/16  3:31 AM  Result Value Ref Range Status   Specimen Description BLOOD LEFT ARM  Final   Special Requests BOTTLES DRAWN AEROBIC AND ANAEROBIC  Final   Culture NO GROWTH 1 DAY  Final   Report Status PENDING  Incomplete  Respiratory Panel by PCR     Status: None   Collection Time: 02/28/16  6:41 AM  Result Value Ref Range Status   Adenovirus NOT DETECTED NOT DETECTED Final   Coronavirus 229E NOT DETECTED NOT DETECTED Final   Coronavirus HKU1 NOT DETECTED NOT DETECTED Final   Coronavirus NL63 NOT DETECTED NOT DETECTED Final   Coronavirus OC43 NOT DETECTED NOT DETECTED Final   Metapneumovirus NOT DETECTED NOT DETECTED Final   Rhinovirus / Enterovirus NOT DETECTED NOT DETECTED Final   Influenza A NOT DETECTED NOT DETECTED Final   Influenza B NOT DETECTED NOT DETECTED Final    Parainfluenza Virus 1 NOT DETECTED NOT DETECTED Final   Parainfluenza Virus 2 NOT DETECTED NOT DETECTED Final   Parainfluenza Virus 3 NOT DETECTED NOT DETECTED Final   Parainfluenza Virus 4 NOT DETECTED NOT DETECTED Final   Respiratory Syncytial Virus NOT DETECTED NOT DETECTED Final   Bordetella pertussis NOT DETECTED NOT DETECTED Final   Chlamydophila pneumoniae NOT DETECTED NOT DETECTED Final   Mycoplasma pneumoniae NOT DETECTED NOT DETECTED Final         Radiology Studies: Dg Chest 2 View  Result Date: 02/28/2016 CLINICAL DATA:  RIGHT-sided chest pain tonight.  History of COPD. EXAM: CHEST  2 VIEW COMPARISON:  Chest radiograph February 27, 2016 FINDINGS: Unchanged rounded airspace opacity RIGHT upper lobe. Mild interstitial prominence without pleural effusion. Cardiac silhouette is mildly enlarged. Calcified aortic knob. No pneumothorax. Osteopenia. Old LEFT rib fractures. IMPRESSION: Unchanged RIGHT upper lobe bronchopneumonia. Followup PA and lateral chest X-ray is recommended in 3-4 weeks following trial of antibiotic therapy to ensure resolution and exclude underlying malignancy. Mild cardiomegaly. Electronically Signed   By: Awilda Metro M.D.   On: 02/28/2016 02:31      Scheduled Meds: . atorvastatin  20 mg Oral q1800  . cefTRIAXone (ROCEPHIN)  IV  1 g Intravenous Q24H  . cholecalciferol  1,000 Units Oral q morning - 10a  . doxycycline  100 mg Oral Q12H  . escitalopram  10 mg Oral Daily  . fenofibrate  160 mg Oral Daily  . gabapentin  100 mg Oral QHS  . hydrALAZINE  25 mg Oral Q12H  . insulin aspart  0-15 Units Subcutaneous TID WC  . insulin aspart  0-5 Units Subcutaneous QHS  . ipratropium-albuterol  3 mL Nebulization TID  . iron polysaccharides  150 mg Oral Daily  . isosorbide mononitrate  30 mg Oral Daily  . metoprolol tartrate  25 mg Oral BID  . multivitamin with minerals  1 tablet Oral QAC breakfast  . Warfarin - Pharmacist  Dosing Inpatient   Does not apply q1800    Continuous Infusions:    LOS: 1 day    Time spent in minutes: 35    Akiba Melfi, MD [facility] Pager: [email or web address] Password [email or web address] 02/29/2016, 3:59 PM

## 2016-02-29 NOTE — Progress Notes (Addendum)
Inpatient Diabetes Program Recommendations  AACE/ADA: New Consensus Statement on Inpatient Glycemic Control (2015)  Target Ranges:  Prepandial:   less than 140 mg/dL      Peak postprandial:   less than 180 mg/dL (1-2 hours)      Critically ill patients:  140 - 180 mg/dL   Lab Results  Component Value Date   GLUCAP 284 (H) 02/29/2016   HGBA1C 8.5 (H) 09/20/2015    Review of Glycemic Control  Results for ADANYA, KITTRELL (MRN [id number]) as of 02/29/2016 15:42  Ref. Range 02/28/2016 11:38 02/28/2016 16:14 02/28/2016 21:32 02/29/2016 07:48 02/29/2016 12:14  Glucose-Capillary Latest Ref Range: 65 - 99 mg/dL 128 (H) 786 (H) 767 (H) 180 (H) 284 (H)    Diabetes history: Type 2 Outpatient Diabetes medications: none Current orders for Inpatient glycemic control: Novolog 0-5 units qhs, Novolog 0-15 units tid  Inpatient Diabetes Program Recommendations: consider starting low dose basal insulin, Lantus 6 units qday      (0.1 unit/kg)  Consider starting Novolog 3 units tid with meals (hold if patient eats less than 50%)  Susette Racer, RN, BA, [place], CDE Diabetes Coordinator Inpatient Diabetes Program  [phone] (Team Pager) [phone] [facility] Office) 02/29/2016 3:45 PM

## 2016-03-01 ENCOUNTER — Inpatient Hospital Stay (HOSPITAL_COMMUNITY): Payer: Medicare Other

## 2016-03-01 DIAGNOSIS — F418 Other specified anxiety disorders: Secondary | ICD-10-CM

## 2016-03-01 DIAGNOSIS — I5032 Chronic diastolic (congestive) heart failure: Secondary | ICD-10-CM

## 2016-03-01 DIAGNOSIS — E118 Type 2 diabetes mellitus with unspecified complications: Secondary | ICD-10-CM

## 2016-03-01 DIAGNOSIS — J189 Pneumonia, unspecified organism: Secondary | ICD-10-CM

## 2016-03-01 LAB — BASIC METABOLIC PANEL
Anion gap: 6 (ref 5–15)
BUN: 50 mg/dL — ABNORMAL HIGH (ref 6–20)
CALCIUM: 8.9 mg/dL (ref 8.9–10.3)
CO2: 24 mmol/L (ref 22–32)
CREATININE: 2.06 mg/dL — AB (ref 0.44–1.00)
Chloride: 105 mmol/L (ref 101–111)
GFR calc Af Amer: 25 mL/min — ABNORMAL LOW (ref >60)
GFR calc non Af Amer: 21 mL/min — ABNORMAL LOW (ref >60)
GLUCOSE: 322 mg/dL — AB (ref 65–99)
Potassium: 4.2 mmol/L (ref 3.5–5.1)
Sodium: 135 mmol/L (ref 135–145)

## 2016-03-01 LAB — GLUCOSE, CAPILLARY
GLUCOSE-CAPILLARY: 300 mg/dL — AB (ref 65–99)
GLUCOSE-CAPILLARY: 338 mg/dL — AB (ref 65–99)
Glucose-Capillary: 226 mg/dL — ABNORMAL HIGH (ref 65–99)
Glucose-Capillary: 100 mg/dL — ABNORMAL HIGH (ref 65–99)

## 2016-03-01 LAB — PROTIME-INR
INR: 5.41
Prothrombin Time: 51 seconds — ABNORMAL HIGH (ref 11.4–15.2)

## 2016-03-01 LAB — CBC
HEMATOCRIT: 27.3 % — AB (ref 36.0–46.0)
Hemoglobin: 8.6 g/dL — ABNORMAL LOW (ref 12.0–15.0)
MCH: 26.7 pg (ref 26.0–34.0)
MCHC: 31.5 g/dL (ref 30.0–36.0)
MCV: 84.8 fL (ref 78.0–100.0)
Platelets: 212 10*3/uL (ref 150–400)
RBC: 3.22 MIL/uL — ABNORMAL LOW (ref 3.87–5.11)
RDW: 15.8 % — AB (ref 11.5–15.5)
WBC: 11.7 10*3/uL — ABNORMAL HIGH (ref 4.0–10.5)

## 2016-03-01 LAB — HEMOGLOBIN A1C
Hgb A1c MFr Bld: 7.4 % — ABNORMAL HIGH (ref 4.8–5.6)
Mean Plasma Glucose: 166 mg/dL

## 2016-03-01 MED ORDER — ACETAMINOPHEN 325 MG PO TABS
650.0000 mg | ORAL_TABLET | ORAL | Status: DC | PRN
  Administered 2016-03-01: 650 mg via ORAL
  Filled 2016-03-01: qty 2

## 2016-03-01 MED ORDER — IPRATROPIUM-ALBUTEROL 0.5-2.5 (3) MG/3ML IN SOLN: 3.0000 mL | Freq: Four times a day (QID) | RESPIRATORY_TRACT | Status: DC | PRN

## 2016-03-01 MED ORDER — TORSEMIDE 20 MG PO TABS
40.0000 mg | ORAL_TABLET | Freq: Every day | ORAL | Status: DC
  Administered 2016-03-01 – 2016-03-02 (×2): 40 mg via ORAL
  Filled 2016-03-01 (×2): qty 2

## 2016-03-01 MED ORDER — SODIUM CHLORIDE 0.9 % IV BOLUS (SEPSIS)
250.0000 mL | Freq: Once | INTRAVENOUS | Status: AC
  Administered 2016-03-01: 250 mL via INTRAVENOUS

## 2016-03-01 NOTE — Progress Notes (Signed)
Fever of 101. NP made aware and new orders received.

## 2016-03-01 NOTE — Progress Notes (Signed)
Physical Therapy Treatment Patient Details Name: Alexis [name] MRN: [id number] DOB: [birth year] Today's Date: 03/01/2016    History of Present Illness Alexis [name] is a 82 y.o. female with medical history significant of hypertension, hyperlipidemia, diabetes mellitus, COPD, depression, anxiety, PAF on Coumadin, GIB, CKD-IV, macular degeneration, poor hearing, who presents with cough, chest pain and generalized weakness.    PT Comments    Patient making improvement with mobility and gait.  Follow Up Recommendations  Home health PT;Supervision/Assistance - 24 hour     Equipment Recommendations  None recommended by PT    Recommendations for Other Services OT consult     Precautions / Restrictions Precautions Precautions: Fall Restrictions Weight Bearing Restrictions: No    Mobility  Bed Mobility Overal bed mobility: Needs Assistance Bed Mobility: Sit to Supine       Sit to supine: Min assist   General bed mobility comments: assist for LEs up into bed  Transfers Overall transfer level: Needs assistance Equipment used: None;Rolling walker (2 wheeled) Transfers: Sit to/from [employer] Sit to Stand: Min assist Stand pivot transfers: Min assist       General transfer comment: Min assist to transfer [facility] <> bed x3 with no assistive device.  Patient requires hand hold assist - unsteady during transfers.  Patient able to stand from bed with RW and min assist to rise to standing and for balance.  Ambulation/Gait Ambulation/Gait assistance: Min assist Ambulation Distance (Feet): 16 Feet Assistive device: Rolling walker (2 wheeled) Gait Pattern/deviations: Step-through pattern;Decreased step length - right;Decreased step length - left;Decreased stride length;Shuffle;Trunk flexed Gait velocity: decreased Gait velocity interpretation: Below normal speed for age/gender General Gait Details: Verbal cues for safe use of RW.  Assist to maneuver RW  during turns.     Stairs            Wheelchair Mobility    Modified Rankin (Stroke Patients Only)       Balance     Sitting balance-Leahy Scale: Fair       Standing balance-Leahy Scale: Poor                      Cognition Arousal/Alertness: Awake/alert Behavior During Therapy: WFL for tasks assessed/performed Overall Cognitive Status: History of cognitive impairments - at baseline       Memory: Decreased short-term memory              Exercises      General Comments        Pertinent Vitals/Pain Pain Assessment: No/denies pain    Home Living                      Prior Function            PT Goals (current goals can now be found in the care plan section) Progress towards PT goals: Progressing toward goals    Frequency    Min 3X/week      PT Plan Current plan remains appropriate    Co-evaluation             End of Session Equipment Utilized During Treatment: Gait belt;Oxygen Activity Tolerance: Patient tolerated treatment well;Patient limited by fatigue Patient left: in bed;with call bell/phone within reach;with bed alarm set     Time: 3354-5625 PT Time Calculation (min) (ACUTE ONLY): 30 min  Charges:  $Gait Training: 8-22 mins $Therapeutic Activity: 8-22 mins  G CodesVena [name]:      Alexis [name] H 03/01/2016, 6:09 PM Alexis [name], PT, [facility] Pager [phone]

## 2016-03-01 NOTE — Progress Notes (Signed)
ANTICOAGULATION CONSULT NOTE - Initial Consult  Pharmacy Consult for Coumadin Indication: atrial fibrillation  Allergies  Allergen Reactions  . Yellow Dyes (Non-Tartrazine) Nausea And Vomiting    "deathly allergic" No other information given to explain reaction. Per Daughter- this was an IV dye. Ok with yellow coloring on Coumadin tablets.   . Codeine Hives  . Other Other (See Comments)    novocaine broke mouth out  . Penicillins Hives  . Procaine Hives   Patient Measurements: Height: 5\' 4"  (162.6 cm) Weight: 138 lb 14.2 oz (63 kg) IBW/kg (Calculated) : 54.7  Vital Signs: Temp: 97.8 F (36.6 C) (10/06 0918) Temp Source: Oral (10/06 0918) BP: 122/55 (10/06 0918) Pulse Rate: 65 (10/06 0918)  Labs:  Recent Labs  02/28/16 0151 02/29/16 0505 03/01/16 0416  HGB 10.5* 8.8*  --   HCT 32.4* 27.7*  --   PLT 210 188  --   LABPROT 29.3* 43.6* 51.0*  INR 2.71 4.46* 5.41*  CREATININE 2.25* 2.02*  --    Estimated Creatinine Clearance: 18.5 mL/min (by C-G formula based on SCr of 2.02 mg/dL (H)).  Medical History: Past Medical History:  Diagnosis Date  . Anxiety   . Chronic diastolic CHF (congestive heart failure) (HCC)    a. 05/2015 Echo: EF 55-60%, mild LVH, no rwma, mild MR, mildly dil RA, mod TR, PASP 58mmHg.  . CKD (chronic kidney disease), stage IV (HCC)   . Confusion state   . COPD (chronic obstructive pulmonary disease) (HCC)    a. on home O2 @ 3lpm.  . Elevated troponin    a. H/o elevated trop in setting of CHF; b. 06/2013 Lexiscan MV: nl EF, mild anterior breast attenuation, no ischemia.   . Family history of adverse reaction to anesthesia   . GIB (gastrointestinal bleeding)    a. History of GIB.  Marland Kitchen. Hypertensive heart disease   . Macular degeneration, bilateral   . Neuropathy (HCC)   . Paroxysmal atrial fibrillation (HCC)    a. CHA2DS2VASc = 6-->coumadin.  Marland Kitchen. PONV (postoperative nausea and vomiting)   . Type II diabetes mellitus (HCC)   . Varicose veins     Medications:  No current facility-administered medications on file prior to encounter.    Current Outpatient Prescriptions on File Prior to Encounter  Medication Sig Dispense Refill  . albuterol (PROVENTIL HFA;VENTOLIN HFA) 108 (90 BASE) MCG/ACT inhaler Inhale 2 puffs into the lungs every 6 (six) hours as needed for wheezing or shortness of breath. 1 Inhaler 3  . atorvastatin (LIPITOR) 20 MG tablet Take 20 mg by mouth daily.    . cholecalciferol (VITAMIN D) 1000 UNITS tablet Take 1,000 Units by mouth every morning.     . docusate sodium (COLACE) 100 MG capsule Take 100 mg by mouth 2 (two) times daily as needed for mild constipation.    Marland Kitchen. escitalopram (LEXAPRO) 10 MG tablet Take 10 mg by mouth daily.    . fenofibrate 160 MG tablet Take 160 mg by mouth daily.    Marland Kitchen. gabapentin (NEURONTIN) 100 MG capsule Take 1 capsule (100 mg total) by mouth at bedtime. 30 capsule 0  . hydrALAZINE (APRESOLINE) 25 MG tablet TAKE 1 TABLET BY MOUTH EVERY 12 HOURS 60 tablet 8  . iron polysaccharides (NIFEREX) 150 MG capsule Take 1 capsule (150 mg total) by mouth daily. 30 capsule 3  . isosorbide mononitrate (IMDUR) 30 MG 24 hr tablet TAKE 1 TABLET BY MOUTH EVERY DAY 30 tablet 1  . metoprolol tartrate (LOPRESSOR) 25 MG tablet  Take 1 tablet (25 mg total) by mouth 2 (two) times daily. 60 tablet 0  . Multiple Vitamin (MULTIVITAMIN WITH MINERALS) TABS tablet Take 1 tablet by mouth daily.    . OXYGEN Inhale 3 L into the lungs at bedtime.     . polyethylene glycol (MIRALAX / GLYCOLAX) packet Take 17 g by mouth daily as needed for mild constipation.    . potassium chloride SA (K-DUR,KLOR-CON) 20 MEQ tablet Take 20 mEq by mouth daily.    Marland Kitchen torsemide (DEMADEX) 20 MG tablet TAKE 2 TABLETS BY MOUTH EVERY DAY 60 tablet 2  . warfarin (COUMADIN) 4 MG tablet Take 1-1.5 tablets by mouth daily as directed by coumadin clinic (Patient taking differently: Take 4 mg by mouth daily. by mouth daily as directed by coumadin clinic) 40 tablet  4    Assessment: 82 y.o. female admitted with UTI, h/o Afib, to continue Coumadin. INR was therapeutic 2.71 at time of admission. Her home regimen was resumed and patient received warfarin 6mg  one. The INR increased to supra-therapeutic levels: 4.46 (10/5) then 5.41 (10/6) despite dose being held last night. There was a noted drop in HgB 10.5 to 8.8, however patient received 3 liters of fluid and the drop may be dilutional. Nurse and provider are both aware and no bleeding has been reported. Pharmacy will continue to monitor the patient closely.   Prior to admission: Coumadin 6 mg MWF, 4 mg TTSS   Goal of Therapy:  INR 2-3 Monitor platelets by anticoagulation protocol: Yes   Plan:  Hold warfarin until INR <3.0 Daily INR, CBC  Ruben Im, PharmD Clinical Pharmacist Pager: [phone] 03/01/2016 9:56 AM

## 2016-03-01 NOTE — Progress Notes (Addendum)
Inpatient Diabetes Program Recommendations  AACE/ADA: New Consensus Statement on Inpatient Glycemic Control (2015)  Target Ranges:  Prepandial:   less than 140 mg/dL      Peak postprandial:   less than 180 mg/dL (1-2 hours)      Critically ill patients:  140 - 180 mg/dL   Lab Results  Component Value Date   GLUCAP 226 (H) 03/01/2016   HGBA1C 7.4 (H) 02/29/2016    Review of Glycemic Control:  Results for VICKII, SADER (MRN [id number]) as of 03/01/2016 11:45  Ref. Range 02/29/2016 07:48 02/29/2016 12:14 02/29/2016 17:04 02/29/2016 21:46 03/01/2016 08:00  Glucose-Capillary Latest Ref Range: 65 - 99 mg/dL 771 (H) 165 (H) 790 (H) 298 (H) 226 (H)    Diabetes history: Diabetes Mellitus Outpatient Diabetes medications: None Current orders for Inpatient glycemic control:  Novolog moderate tid with meals and HS  Inpatient Diabetes Program Recommendations:   Note that patient was not on medications for diabetes prior to admit.  May consider adding oral agent such as DPP-4.  While in hospital consider Tradjenta 5 mg daily.  Upon discharge may need to change to Januvia 25 mg daily (renal dosing for CrCl<30) due to medication/insurance coverage/cost.  Thanks, Beryl Meager, RN, BC-ADM Inpatient Diabetes Coordinator Pager [phone] (8a-5p)

## 2016-03-01 NOTE — Progress Notes (Signed)
PROGRESS NOTE    Alexis [name]  BUL:[id number] DOB: 05-12-1932 DOA: 02/28/2016  PCP: [name], PA-C   Brief Narrative:  Alexis [name] is a 82 y.o. female with medical history significant of hypertension, hyperlipidemia, diabetes mellitus, COPD, depression, anxiety, PAF on Coumadin, GIB, CKD-IV, macular degeneration, poor hearing, who presents with cough, chest pain and generalized weakness. Her son also states that she has been confused.   Subjective: No complaints of cough or dyspnea.   Assessment & Plan:  Sepsis/ CAP and UTI - blood cultures NGTD, legionella pending-  strep antigen negative  - Resp viral panel negative - U culture unhelpful as it shows multiple species - as infitrate is RUL, have ordered an MBS to ensure she has no aspiration issues- this is negative  - still had fever of 101 yesterday- repeat CXR shows worsening RUL infiltrate and new LLL infiltrate- although this did not show on the initial CXR, I was able to hear LLL crackles on admission so suspect it was present at that time.  - cont Rocephin and Doxycycline today  Active Problems:   Chronic diastolic heart failure, NYHA class 1 - compensated-  Torsemide resumed today -cont Toprol - follows with [facility]    Acute renal failure superimposed on stage 4 chronic kidney disease (HCC) - baseline Cr ranges from 1.7 to 2.25  - resume Torsemide today    Essential hypertension with hypertensive heart disease - Toprol / Imdur    Atrial fibrillation, persistent - s/p cardioversion CHA2DS2-VASc Score 6 -Coumadin/ Toprol - HR stable  Supratherapeutic INR - pharmacy managing coumadin    COPD (chronic obstructive pulmonary disease)  - stable - cont nebs  - on 3 L O2 at home  AOCD - stable  Diabetes mellitus  -cont ISS- Hb A1c 8.5 on 4/17- does not appear to be on medications at home - recheck Hb A1c - increased SSI      Depression with anxiety - Lexapro  Pressure injury of  skin-stage 2  - cont wound care for pressure ulcer  HLD  -Continue home medications: Lipitor and fenofibrate    DVT prophylaxis: Coumadin Code Status: DNR Family Communication: son and daughter Disposition Plan: home when stable Consultants:    Procedures:    Antimicrobials:  Anti-infectives    Start     Dose/Rate Route Frequency Ordered Stop   02/29/16 0600  cefTRIAXone (ROCEPHIN) 1 g in dextrose 5 % 50 mL IVPB     1 g 100 mL/hr over 30 Minutes Intravenous Every 24 hours 02/28/16 0345 03/07/16 0559   02/28/16 0500  doxycycline (VIBRA-TABS) tablet 100 mg     100 mg Oral Every 12 hours 02/28/16 0345 03/06/16 0959   02/28/16 0345  cefTRIAXone (ROCEPHIN) 1 g in dextrose 5 % 50 mL IVPB  Status:  Discontinued     1 g 100 mL/hr over 30 Minutes Intravenous Every 24 hours 02/28/16 0341 02/28/16 0400   02/28/16 0330  doxycycline (VIBRAMYCIN) 100 mg in dextrose 5 % 250 mL IVPB  Status:  Discontinued     100 mg 125 mL/hr over 120 Minutes Intravenous  Once 02/28/16 0302 02/28/16 0401   02/28/16 0315  cefTRIAXone (ROCEPHIN) 1 g in dextrose 5 % 50 mL IVPB     1 g 100 mL/hr over 30 Minutes Intravenous  Once 02/28/16 0302 02/28/16 0444       Objective: Vitals:   03/01/16 0038 03/01/16 0130 03/01/16 0617 03/01/16 0918  BP:   (!) 144/69 (!) 122/55  Pulse:   61 65  Resp:   19 18  Temp: (!) 101 F (38.3 C) 99.6 F (37.6 C) 97.8 F (36.6 C) 97.8 F (36.6 C)  TempSrc:  Oral  Oral  SpO2:   96% 97%  Weight:      Height:        Intake/Output Summary (Last 24 hours) at 03/01/16 1444 Last data filed at 03/01/16 0900  Gross per 24 hour  Intake             1340 ml  Output              300 ml  Net             1040 ml   Filed Weights   02/28/16 0619 02/28/16 2123 02/29/16 2145  Weight: 61.3 kg (135 lb 2.3 oz) 61.3 kg (135 lb 2.3 oz) 63 kg (138 lb 14.2 oz)    Examination: General exam: Appears comfortable  HEENT: PERRLA, oral mucosa moist, no sclera icterus or  thrush Respiratory system: Clear to auscultation. Respiratory effort normal. Cardiovascular system: S1 & S2 heard, RRR.  No murmurs  Gastrointestinal system: Abdomen soft, non-tender, nondistended. Normal bowel sound. No organomegaly Central nervous system: Alert and oriented. No focal neurological deficits. Extremities: No cyanosis, clubbing or edema Skin: No rashes or ulcers Psychiatry:  Mood & affect appropriate.     Data Reviewed: I have personally reviewed following labs and imaging studies  CBC:  Recent Labs Lab 02/28/16 0151 02/29/16 0505 03/01/16 1105  WBC 16.7* 13.3* 11.7*  NEUTROABS 14.5*  --   --   HGB 10.5* 8.8* 8.6*  HCT 32.4* 27.7* 27.3*  MCV 85.0 85.2 84.8  PLT 210 188 212   Basic Metabolic Panel:  Recent Labs Lab 02/28/16 0151 02/29/16 0505 03/01/16 1105  NA 132* 136 135  K 3.8 3.7 4.2  CL 98* 99* 105  CO2 24 24 24   GLUCOSE 318* 188* 322*  BUN 46* 43* 50*  CREATININE 2.25* 2.02* 2.06*  CALCIUM 9.3 8.8* 8.9   GFR: Estimated Creatinine Clearance: 18.2 mL/min (by C-G formula based on SCr of 2.06 mg/dL (H)). Liver Function Tests: No results for input(s): AST, ALT, ALKPHOS, BILITOT, PROT, ALBUMIN in the last 168 hours. No results for input(s): LIPASE, AMYLASE in the last 168 hours. No results for input(s): AMMONIA in the last 168 hours. Coagulation Profile:  Recent Labs Lab 02/26/16 1443 02/28/16 0151 02/29/16 0505 03/01/16 0416  INR 2.6 2.71 4.46* 5.41*   Cardiac Enzymes: No results for input(s): CKTOTAL, CKMB, CKMBINDEX, TROPONINI in the last 168 hours. BNP (last 3 results) No results for input(s): PROBNP in the last 8760 hours. HbA1C:  Recent Labs  02/29/16 0505  HGBA1C 7.4*   CBG:  Recent Labs Lab 02/29/16 1214 02/29/16 1704 02/29/16 2146 03/01/16 0800 03/01/16 1146  GLUCAP 284* 289* 298* 226* 300*   Lipid Profile: No results for input(s): CHOL, HDL, LDLCALC, TRIG, CHOLHDL, LDLDIRECT in the last 72 hours. Thyroid  Function Tests: No results for input(s): TSH, T4TOTAL, FREET4, T3FREE, THYROIDAB in the last 72 hours. Anemia Panel: No results for input(s): VITAMINB12, FOLATE, FERRITIN, TIBC, IRON, RETICCTPCT in the last 72 hours. Urine analysis:    Component Value Date/Time   COLORURINE YELLOW 02/28/2016 0139   APPEARANCEUR TURBID (A) 02/28/2016 0139   LABSPEC 1.019 02/28/2016 0139   PHURINE 5.5 02/28/2016 0139   GLUCOSEU 250 (A) 02/28/2016 0139   HGBUR TRACE (A) 02/28/2016 0139   BILIRUBINUR NEGATIVE 02/28/2016 0139  KETONESUR NEGATIVE 02/28/2016 0139   PROTEINUR >300 (A) 02/28/2016 0139   UROBILINOGEN 0.2 12/14/2014 1821   NITRITE NEGATIVE 02/28/2016 0139   LEUKOCYTESUR MODERATE (A) 02/28/2016 0139   Sepsis Labs: @LABRCNTIP (procalcitonin:4,lacticidven:4) ) Recent Results (from the past 240 hour(s))  Blood culture (routine x 2)     Status: None (Preliminary result)   Collection Time: 02/28/16  3:06 AM  Result Value Ref Range Status   Specimen Description BLOOD RIGHT FOREARM  Final   Special Requests IN PEDIATRIC BOTTLE 2ML  Final   Culture NO GROWTH 1 DAY  Final   Report Status PENDING  Incomplete  Urine culture     Status: Abnormal   Collection Time: 02/28/16  3:08 AM  Result Value Ref Range Status   Specimen Description URINE, CLEAN CATCH  Final   Special Requests NONE  Final   Culture MULTIPLE SPECIES PRESENT, SUGGEST RECOLLECTION (A)  Final   Report Status 02/29/2016 FINAL  Final  Blood culture (routine x 2)     Status: None (Preliminary result)   Collection Time: 02/28/16  3:31 AM  Result Value Ref Range Status   Specimen Description BLOOD LEFT ARM  Final   Special Requests BOTTLES DRAWN AEROBIC AND ANAEROBIC 5ML  Final   Culture NO GROWTH 1 DAY  Final   Report Status PENDING  Incomplete  Respiratory Panel by PCR     Status: None   Collection Time: 02/28/16  6:41 AM  Result Value Ref Range Status   Adenovirus NOT DETECTED NOT DETECTED Final   Coronavirus 229E NOT DETECTED  NOT DETECTED Final   Coronavirus HKU1 NOT DETECTED NOT DETECTED Final   Coronavirus NL63 NOT DETECTED NOT DETECTED Final   Coronavirus OC43 NOT DETECTED NOT DETECTED Final   Metapneumovirus NOT DETECTED NOT DETECTED Final   Rhinovirus / Enterovirus NOT DETECTED NOT DETECTED Final   Influenza A NOT DETECTED NOT DETECTED Final   Influenza B NOT DETECTED NOT DETECTED Final   Parainfluenza Virus 1 NOT DETECTED NOT DETECTED Final   Parainfluenza Virus 2 NOT DETECTED NOT DETECTED Final   Parainfluenza Virus 3 NOT DETECTED NOT DETECTED Final   Parainfluenza Virus 4 NOT DETECTED NOT DETECTED Final   Respiratory Syncytial Virus NOT DETECTED NOT DETECTED Final   Bordetella pertussis NOT DETECTED NOT DETECTED Final   Chlamydophila pneumoniae NOT DETECTED NOT DETECTED Final   Mycoplasma pneumoniae NOT DETECTED NOT DETECTED Final         Radiology Studies: Dg Chest 2 View  Result Date: 03/01/2016 CLINICAL DATA:  Fever, history of diabetes, CHF, COPD EXAM: CHEST  2 VIEW COMPARISON:  PA and lateral chest x-ray of February 28, 2016 and portable chest x-ray of June 11, 2015. FINDINGS: Alveolar opacity with air bronchograms have become more conspicuous in the right upper lobe. There is patchy density in the left lower lobe which appears new. Small bilateral pleural effusions blunt the costophrenic angles posteriorly. The cardiac silhouette is enlarged. The central pulmonary vascularity is engorged. The observed bony thorax exhibits no acute abnormality. There is chronic deformity of multiple left lateral ribs. IMPRESSION: Worsening pneumonia in the right upper lobe with new infiltrate in the left lower lobe. Small left pleural effusion. Cardiomegaly with mild central pulmonary vascular engorgement. Electronically Signed   By: David  Swaziland[place] M.D.   On: 03/01/2016 10:38      Scheduled Meds: . atorvastatin  20 mg Oral q1800  . cefTRIAXone (ROCEPHIN)  IV  1 g Intravenous Q24H  . cholecalciferol  1,000  Units  Oral q morning - 10a  . doxycycline  100 mg Oral Q12H  . escitalopram  10 mg Oral Daily  . fenofibrate  160 mg Oral Daily  . gabapentin  100 mg Oral QHS  . hydrALAZINE  25 mg Oral Q12H  . insulin aspart  0-15 Units Subcutaneous TID WC  . insulin aspart  0-5 Units Subcutaneous QHS  . iron polysaccharides  150 mg Oral Daily  . isosorbide mononitrate  30 mg Oral Daily  . mouth rinse  15 mL Mouth Rinse BID  . metoprolol tartrate  25 mg Oral BID  . multivitamin with minerals  1 tablet Oral QAC breakfast  . Warfarin - Pharmacist Dosing Inpatient   Does not apply q1800   Continuous Infusions:    LOS: 2 days    Time spent in minutes: 35    Resean Brander, MD [facility] Pager: [email or web address] Password [email or web address] 03/01/2016, 2:44 PM

## 2016-03-02 LAB — BASIC METABOLIC PANEL
Anion gap: 9 (ref 5–15)
BUN: 55 mg/dL — AB (ref 6–20)
CHLORIDE: 104 mmol/L (ref 101–111)
CO2: 25 mmol/L (ref 22–32)
CREATININE: 2.01 mg/dL — AB (ref 0.44–1.00)
Calcium: 9.4 mg/dL (ref 8.9–10.3)
GFR calc Af Amer: 25 mL/min — ABNORMAL LOW (ref >60)
GFR calc non Af Amer: 22 mL/min — ABNORMAL LOW (ref >60)
GLUCOSE: 209 mg/dL — AB (ref 65–99)
Potassium: 4.1 mmol/L (ref 3.5–5.1)
Sodium: 138 mmol/L (ref 135–145)

## 2016-03-02 LAB — CBC
HCT: 29.5 % — ABNORMAL LOW (ref 36.0–46.0)
Hemoglobin: 9.6 g/dL — ABNORMAL LOW (ref 12.0–15.0)
MCH: 27.8 pg (ref 26.0–34.0)
MCHC: 32.5 g/dL (ref 30.0–36.0)
MCV: 85.5 fL (ref 78.0–100.0)
PLATELETS: 261 10*3/uL (ref 150–400)
RBC: 3.45 MIL/uL — AB (ref 3.87–5.11)
RDW: 16 % — AB (ref 11.5–15.5)
WBC: 12.9 10*3/uL — ABNORMAL HIGH (ref 4.0–10.5)

## 2016-03-02 LAB — GLUCOSE, CAPILLARY
GLUCOSE-CAPILLARY: 204 mg/dL — AB (ref 65–99)
GLUCOSE-CAPILLARY: 256 mg/dL — AB (ref 65–99)
GLUCOSE-CAPILLARY: 341 mg/dL — AB (ref 65–99)
Glucose-Capillary: 203 mg/dL — ABNORMAL HIGH (ref 65–99)

## 2016-03-02 LAB — URINALYSIS, ROUTINE W REFLEX MICROSCOPIC
BILIRUBIN URINE: NEGATIVE
Glucose, UA: NEGATIVE mg/dL
Ketones, ur: NEGATIVE mg/dL
Leukocytes, UA: NEGATIVE
Nitrite: NEGATIVE
PH: 5 (ref 5.0–8.0)
Protein, ur: 30 mg/dL — AB
SPECIFIC GRAVITY, URINE: 1.02 (ref 1.005–1.030)

## 2016-03-02 LAB — PROTIME-INR
INR: 6.9 — AB
PROTHROMBIN TIME: 61.9 s — AB (ref 11.4–15.2)

## 2016-03-02 LAB — URINE MICROSCOPIC-ADD ON

## 2016-03-02 LAB — LACTIC ACID, PLASMA: Lactic Acid, Venous: 1 mmol/L (ref 0.5–1.9)

## 2016-03-02 MED ORDER — LEVOFLOXACIN 500 MG PO TABS
500.0000 mg | ORAL_TABLET | Freq: Once | ORAL | Status: AC
  Administered 2016-03-02: 500 mg via ORAL
  Filled 2016-03-02: qty 1

## 2016-03-02 MED ORDER — LEVOFLOXACIN 500 MG PO TABS
250.0000 mg | ORAL_TABLET | ORAL | Status: DC
  Administered 2016-03-04: 250 mg via ORAL
  Filled 2016-03-02: qty 1

## 2016-03-02 NOTE — Progress Notes (Signed)
Pharmacy Antibiotic Note  Alexis [name] is a 82 y.o. female admitted on 02/28/2016 with pneumonia.  Pharmacy has been consulted for Levaquin dosing.  Pt has been on doxy + ceftriaxone since 10/4. Pharmacy consulted to change abx to Levaquin for CAP. Pt with baseline kidney dysfunction with baseline SCr ~1.7-1.8. SCr 2.06 yesterday, CrCl ~ 19 ml/min.   Plan: - Levaquin 500 mg PO x 1 - Then levaquin 250 mg PO q48h - Consider stopping after 5-7 total days of abx (10/8-10/10) - Monitor renal fx  Height: 5\' 4"  (162.6 cm) Weight: 146 lb 9.7 oz (66.5 kg) IBW/kg (Calculated) : 54.7  Temp (24hrs), Avg:99.3 F (37.4 C), Min:97.8 F (36.6 C), Max:100.9 F (38.3 C)   Recent Labs Lab 02/28/16 0151 02/28/16 0319 02/29/16 0505 03/01/16 1105  WBC 16.7*  --  13.3* 11.7*  CREATININE 2.25*  --  2.02* 2.06*  LATICACIDVEN  --  0.64  --   --     Estimated Creatinine Clearance: 19.7 mL/min (by C-G formula based on SCr of 2.06 mg/dL (H)).    Allergies  Allergen Reactions  . Yellow Dyes (Non-Tartrazine) Nausea And Vomiting    "deathly allergic" No other information given to explain reaction. Per Daughter- this was an IV dye. Ok with yellow coloring on Coumadin tablets.   . Codeine Hives  . Other Other (See Comments)    novocaine broke mouth out  . Penicillins Hives  . Procaine Hives    Antimicrobials this admission: Ceftriaxone 10/4 >> 10/7 Doxycycline 10/4 >> 10/7 Levaquin 10/7 >>  Dose adjustments this admission: None  Microbiology results: 10/4 BCx: NGTD 10/4 UCx: MSP  10/4: Resp panel: negative  Thank you for allowing pharmacy to be a part of this patient's care.  Cassie L. Roseanne Reno, PharmD Infectious Diseases Clinical Pharmacist Pager: [phone] 03/02/2016 8:46 AM

## 2016-03-02 NOTE — Progress Notes (Signed)
PROGRESS NOTE    Alexis [name]  RUE:[id number] DOB: 02/14/1932 DOA: 02/28/2016  PCP: Ladora DanielBEAL, [name], PA-C   Brief Narrative:  Alexis [name] is a 82 y.o. female with medical history significant of hypertension, hyperlipidemia, diabetes mellitus, COPD, depression, anxiety, PAF on Coumadin, GIB, CKD-IV, macular degeneration, poor hearing, who presents with cough, chest pain and generalized weakness. Her son also states that she has been confused.   Subjective: No complaints.  Assessment & Plan:  Sepsis/ CAP and UTI - blood cultures NGTD, legionella pending-  strep antigen negative  - Resp viral panel negative - U culture unhelpful as it shows multiple species - as infitrate is RUL, I ordered an MBS to ensure she has no aspiration issues- this is negative  - still had fever of 101 on 10/5 - repeat CXR 10/6 shows worsening RUL infiltrate and new LLL infiltrate- although this did not show on the initial CXR, I was able to hear LLL crackles on admission so suspect it was present at that time.  - continues to have low grade fevers- 100 last night - repeat UA is negative- will switch Rocephin and Doxy to Levaquin today- checking Pro calcitonin- no other source of fever found  Active Problems:   Chronic diastolic heart failure, NYHA class 1 - compensated-  Torsemide resumed  -cont Toprol - follows with [facility]    Acute renal failure superimposed on stage 4 chronic kidney disease (HCC) - baseline Cr ranges from 1.7 to 2.25  - resumed Torsemide     Essential hypertension with hypertensive heart disease - Lopressor / Imdur/ Hydralazine    Atrial fibrillation, persistent - s/p cardioversion CHA2DS2-VASc Score 6 -Coumadin/ Toprol - HR stable  Supratherapeutic INR - pharmacy managing coumadin    COPD (chronic obstructive pulmonary disease)  - stable - cont nebs  - on 3 L O2 at home  AOCD - stable  Diabetes mellitus  - Hb A1c 8.5 on 4/17 - does not appear to be on  medications at home- her daughter confirms that her sugars were dropping on the medication and it was therefore stopped by her PCP - rechecked Hb A1c -7.4 - increased SSI  to moderate- will avoid tight control    Depression with anxiety - Lexapro  Pressure injury of skin-stage 2  - cont wound care for pressure ulcer  HLD  -Continue home medications: Lipitor and fenofibrate    DVT prophylaxis: Coumadin Code Status: DNR Family Communication: son and daughter Disposition Plan: home when stable Consultants:    Procedures:    Antimicrobials:  Anti-infectives    Start     Dose/Rate Route Frequency Ordered Stop   03/04/16 1200  levofloxacin (LEVAQUIN) tablet 250 mg     250 mg Oral Every 48 hours 03/02/16 0848     03/02/16 1200  levofloxacin (LEVAQUIN) tablet 500 mg     500 mg Oral Once 03/02/16 0848 03/02/16 1138   02/29/16 0600  cefTRIAXone (ROCEPHIN) 1 g in dextrose 5 % 50 mL IVPB  Status:  Discontinued     1 g 100 mL/hr over 30 Minutes Intravenous Every 24 hours 02/28/16 0345 03/02/16 0822   02/28/16 0500  doxycycline (VIBRA-TABS) tablet 100 mg  Status:  Discontinued     100 mg Oral Every 12 hours 02/28/16 0345 03/02/16 0822   02/28/16 0345  cefTRIAXone (ROCEPHIN) 1 g in dextrose 5 % 50 mL IVPB  Status:  Discontinued     1 g 100 mL/hr over 30 Minutes Intravenous Every 24  hours 02/28/16 0341 02/28/16 0400   02/28/16 0330  doxycycline (VIBRAMYCIN) 100 mg in dextrose 5 % 250 mL IVPB  Status:  Discontinued     100 mg 125 mL/hr over 120 Minutes Intravenous  Once 02/28/16 0302 02/28/16 0401   02/28/16 0315  cefTRIAXone (ROCEPHIN) 1 g in dextrose 5 % 50 mL IVPB     1 g 100 mL/hr over 30 Minutes Intravenous  Once 02/28/16 0302 02/28/16 0444       Objective: Vitals:   03/01/16 1716 03/01/16 2100 03/02/16 0541 03/02/16 0935  BP: (!) 160/61 (!) 148/58 (!) 126/50 99/77  Pulse: 90 (!) 109 88 75  Resp: 18 19 19 18   Temp: (!) 100.9 F (38.3 C) 100 F (37.8 C) 98.3 F (36.8  C) 98.3 F (36.8 C)  TempSrc: Oral Oral Oral Oral  SpO2: 95% 94% 98% 98%  Weight:  66.5 kg (146 lb 9.7 oz)    Height:        Intake/Output Summary (Last 24 hours) at 03/02/16 1436 Last data filed at 03/02/16 1027  Gross per 24 hour  Intake              480 ml  Output              225 ml  Net              255 ml   Filed Weights   02/28/16 2123 02/29/16 2145 03/01/16 2100  Weight: 61.3 kg (135 lb 2.3 oz) 63 kg (138 lb 14.2 oz) 66.5 kg (146 lb 9.7 oz)    Examination: General exam: Appears comfortable  HEENT: PERRLA, oral mucosa moist, no sclera icterus or thrush Respiratory system: Clear to auscultation. Respiratory effort normal. Cardiovascular system: S1 & S2 heard, RRR.  No murmurs  Gastrointestinal system: Abdomen soft, non-tender, nondistended. Normal bowel sound. No organomegaly Central nervous system: Alert and oriented. No focal neurological deficits. Extremities: No cyanosis, clubbing or edema Skin: No rashes or ulcers Psychiatry:  Mood & affect appropriate.     Data Reviewed: I have personally reviewed following labs and imaging studies  CBC:  Recent Labs Lab 02/28/16 0151 02/29/16 0505 03/01/16 1105 03/02/16 0845  WBC 16.7* 13.3* 11.7* 12.9*  NEUTROABS 14.5*  --   --   --   HGB 10.5* 8.8* 8.6* 9.6*  HCT 32.4* 27.7* 27.3* 29.5*  MCV 85.0 85.2 84.8 85.5  PLT 210 188 212 261   Basic Metabolic Panel:  Recent Labs Lab 02/28/16 0151 02/29/16 0505 03/01/16 1105 03/02/16 0845  NA 132* 136 135 138  K 3.8 3.7 4.2 4.1  CL 98* 99* 105 104  CO2 24 24 24 25   GLUCOSE 318* 188* 322* 209*  BUN 46* 43* 50* 55*  CREATININE 2.25* 2.02* 2.06* 2.01*  CALCIUM 9.3 8.8* 8.9 9.4   GFR: Estimated Creatinine Clearance: 20.2 mL/min (by C-G formula based on SCr of 2.01 mg/dL (H)). Liver Function Tests: No results for input(s): AST, ALT, ALKPHOS, BILITOT, PROT, ALBUMIN in the last 168 hours. No results for input(s): LIPASE, AMYLASE in the last 168 hours. No results  for input(s): AMMONIA in the last 168 hours. Coagulation Profile:  Recent Labs Lab 02/26/16 1443 02/28/16 0151 02/29/16 0505 03/01/16 0416 03/02/16 0407  INR 2.6 2.71 4.46* 5.41* 6.90*   Cardiac Enzymes: No results for input(s): CKTOTAL, CKMB, CKMBINDEX, TROPONINI in the last 168 hours. BNP (last 3 results) No results for input(s): PROBNP in the last 8760 hours. HbA1C:  Recent Labs  02/29/16 0505  HGBA1C 7.4*   CBG:  Recent Labs Lab 03/01/16 1146 03/01/16 1713 03/01/16 2154 03/02/16 0720 03/02/16 1133  GLUCAP 300* 338* 100* 204* 256*   Lipid Profile: No results for input(s): CHOL, HDL, LDLCALC, TRIG, CHOLHDL, LDLDIRECT in the last 72 hours. Thyroid Function Tests: No results for input(s): TSH, T4TOTAL, FREET4, T3FREE, THYROIDAB in the last 72 hours. Anemia Panel: No results for input(s): VITAMINB12, FOLATE, FERRITIN, TIBC, IRON, RETICCTPCT in the last 72 hours. Urine analysis:    Component Value Date/Time   COLORURINE YELLOW 03/02/2016 0749   APPEARANCEUR CLEAR 03/02/2016 0749   LABSPEC 1.020 03/02/2016 0749   PHURINE 5.0 03/02/2016 0749   GLUCOSEU NEGATIVE 03/02/2016 0749   HGBUR TRACE (A) 03/02/2016 0749   BILIRUBINUR NEGATIVE 03/02/2016 0749   KETONESUR NEGATIVE 03/02/2016 0749   PROTEINUR 30 (A) 03/02/2016 0749   UROBILINOGEN 0.2 12/14/2014 1821   NITRITE NEGATIVE 03/02/2016 0749   LEUKOCYTESUR NEGATIVE 03/02/2016 0749   Sepsis Labs: @LABRCNTIP (procalcitonin:4,lacticidven:4) ) Recent Results (from the past 240 hour(s))  Blood culture (routine x 2)     Status: None (Preliminary result)   Collection Time: 02/28/16  3:06 AM  Result Value Ref Range Status   Specimen Description BLOOD RIGHT FOREARM  Final   Special Requests IN PEDIATRIC BOTTLE  Final   Culture NO GROWTH 3 DAYS  Final   Report Status PENDING  Incomplete  Urine culture     Status: Abnormal   Collection Time: 02/28/16  3:08 AM  Result Value Ref Range Status   Specimen Description  URINE, CLEAN CATCH  Final   Special Requests NONE  Final   Culture MULTIPLE SPECIES PRESENT, SUGGEST RECOLLECTION (A)  Final   Report Status 02/29/2016 FINAL  Final  Blood culture (routine x 2)     Status: None (Preliminary result)   Collection Time: 02/28/16  3:31 AM  Result Value Ref Range Status   Specimen Description BLOOD LEFT ARM  Final   Special Requests BOTTLES DRAWN AEROBIC AND ANAEROBIC  Final   Culture NO GROWTH 3 DAYS  Final   Report Status PENDING  Incomplete  Respiratory Panel by PCR     Status: None   Collection Time: 02/28/16  6:41 AM  Result Value Ref Range Status   Adenovirus NOT DETECTED NOT DETECTED Final   Coronavirus 229E NOT DETECTED NOT DETECTED Final   Coronavirus HKU1 NOT DETECTED NOT DETECTED Final   Coronavirus NL63 NOT DETECTED NOT DETECTED Final   Coronavirus OC43 NOT DETECTED NOT DETECTED Final   Metapneumovirus NOT DETECTED NOT DETECTED Final   Rhinovirus / Enterovirus NOT DETECTED NOT DETECTED Final   Influenza A NOT DETECTED NOT DETECTED Final   Influenza B NOT DETECTED NOT DETECTED Final   Parainfluenza Virus 1 NOT DETECTED NOT DETECTED Final   Parainfluenza Virus 2 NOT DETECTED NOT DETECTED Final   Parainfluenza Virus 3 NOT DETECTED NOT DETECTED Final   Parainfluenza Virus 4 NOT DETECTED NOT DETECTED Final   Respiratory Syncytial Virus NOT DETECTED NOT DETECTED Final   Bordetella pertussis NOT DETECTED NOT DETECTED Final   Chlamydophila pneumoniae NOT DETECTED NOT DETECTED Final   Mycoplasma pneumoniae NOT DETECTED NOT DETECTED Final         Radiology Studies: Dg Chest 2 View  Result Date: 03/01/2016 CLINICAL DATA:  Fever, history of diabetes, CHF, COPD EXAM: CHEST  2 VIEW COMPARISON:  PA and lateral chest x-ray of February 28, 2016 and portable chest x-ray of June 11, 2015. FINDINGS: Alveolar opacity  with air bronchograms have become more conspicuous in the right upper lobe. There is patchy density in the left lower lobe which appears  new. Small bilateral pleural effusions blunt the costophrenic angles posteriorly. The cardiac silhouette is enlarged. The central pulmonary vascularity is engorged. The observed bony thorax exhibits no acute abnormality. There is chronic deformity of multiple left lateral ribs. IMPRESSION: Worsening pneumonia in the right upper lobe with new infiltrate in the left lower lobe. Small left pleural effusion. Cardiomegaly with mild central pulmonary vascular engorgement. Electronically Signed   By: David  [place] M.D.   On: 03/01/2016 10:38      Scheduled Meds: . atorvastatin  20 mg Oral q1800  . cholecalciferol  1,000 Units Oral q morning - 10a  . escitalopram  10 mg Oral Daily  . fenofibrate  160 mg Oral Daily  . gabapentin  100 mg Oral QHS  . hydrALAZINE  25 mg Oral Q12H  . insulin aspart  0-15 Units Subcutaneous TID WC  . insulin aspart  0-5 Units Subcutaneous QHS  . iron polysaccharides  150 mg Oral Daily  . isosorbide mononitrate  30 mg Oral Daily  . [START ON 03/04/2016] levofloxacin  250 mg Oral Q48H  . mouth rinse  15 mL Mouth Rinse BID  . metoprolol tartrate  25 mg Oral BID  . multivitamin with minerals  1 tablet Oral QAC breakfast  . torsemide  40 mg Oral Daily  . Warfarin - Pharmacist Dosing Inpatient   Does not apply q1800   Continuous Infusions:    LOS: 3 days    Time spent in minutes: 35    Eula Mazzola, MD [facility] Pager: [email or web address] Password [email or web address] 03/02/2016, 2:36 PM

## 2016-03-02 NOTE — Progress Notes (Signed)
ANTICOAGULATION CONSULT NOTE - Follow Up Consult  Pharmacy Consult for Coumadin Indication: atrial fibrillation  Allergies  Allergen Reactions  . Yellow Dyes (Non-Tartrazine) Nausea And Vomiting    "deathly allergic" No other information given to explain reaction. Per Daughter- this was an IV dye. Ok with yellow coloring on Coumadin tablets.   . Codeine Hives  . Other Other (See Comments)    novocaine broke mouth out  . Penicillins Hives  . Procaine Hives    Patient Measurements: Height: 5\' 4"  (162.6 cm) Weight: 146 lb 9.7 oz (66.5 kg) IBW/kg (Calculated) : 54.7  Vital Signs: Temp: 98.3 F (36.8 C) (10/07 0935) Temp Source: Oral (10/07 0935) BP: 99/77 (10/07 0935) Pulse Rate: 75 (10/07 0935)  Labs:  Recent Labs  02/29/16 0505 03/01/16 0416 03/01/16 1105 03/02/16 0407 03/02/16 0845  HGB 8.8*  --  8.6*  --  9.6*  HCT 27.7*  --  27.3*  --  29.5*  PLT 188  --  212  --  261  LABPROT 43.6* 51.0*  --  61.9*  --   INR 4.46* 5.41*  --  6.90*  --   CREATININE 2.02*  --  2.06*  --  2.01*    Estimated Creatinine Clearance: 20.2 mL/min (by C-G formula based on SCr of 2.01 mg/dL (H)).  Assessment: 84yof on coumadin pta for afib. INR 2.71 on admission and home regimen resumed. Last dose taken 10/4. INR has since increased to supratherapeutic levels 4.46 > 5.41 > 6.9. Could be due to drug interaction with doxycycline (10/4>10/6) which has now been stopped and changed to levaquin which also can elevate INR. LFTs back in May were wnl, will check again tomorrow. CBC remains stable. No bleeding reported.  Home regimen: 4mg  daily except 6mg  MWF  Goal of Therapy:  INR 2-3 Monitor platelets by anticoagulation protocol: Yes   Plan:  1) No coumadin tonight 2) Daily INR 3) Check CM[facility] tomorrow for LFTs  Fredrik Rigger 03/02/2016,10:50 AM

## 2016-03-03 LAB — PROTIME-INR
INR: 9.22 — AB
INR: 7.74 — AB
PROTHROMBIN TIME: 78.1 s — AB (ref 11.4–15.2)
Prothrombin Time: 67.8 seconds — ABNORMAL HIGH (ref 11.4–15.2)

## 2016-03-03 LAB — GLUCOSE, CAPILLARY
GLUCOSE-CAPILLARY: 171 mg/dL — AB (ref 65–99)
GLUCOSE-CAPILLARY: 257 mg/dL — AB (ref 65–99)
GLUCOSE-CAPILLARY: 240 mg/dL — AB (ref 65–99)
Glucose-Capillary: 298 mg/dL — ABNORMAL HIGH (ref 65–99)

## 2016-03-03 LAB — CBC
HCT: 26 % — ABNORMAL LOW (ref 36.0–46.0)
HEMOGLOBIN: 8.1 g/dL — AB (ref 12.0–15.0)
MCH: 26.5 pg (ref 26.0–34.0)
MCHC: 31.2 g/dL (ref 30.0–36.0)
MCV: 85 fL (ref 78.0–100.0)
Platelets: 289 10*3/uL (ref 150–400)
RBC: 3.06 MIL/uL — AB (ref 3.87–5.11)
RDW: 16.3 % — ABNORMAL HIGH (ref 11.5–15.5)
WBC: 9.7 10*3/uL (ref 4.0–10.5)

## 2016-03-03 LAB — COMPREHENSIVE METABOLIC PANEL
ALT: 16 U/L (ref 14–54)
AST: 21 U/L (ref 15–41)
Albumin: 1.8 g/dL — ABNORMAL LOW (ref 3.5–5.0)
Alkaline Phosphatase: 114 U/L (ref 38–126)
Anion gap: 12 (ref 5–15)
BILIRUBIN TOTAL: 0.6 mg/dL (ref 0.3–1.2)
BUN: 70 mg/dL — ABNORMAL HIGH (ref 6–20)
CHLORIDE: 100 mmol/L — AB (ref 101–111)
CO2: 25 mmol/L (ref 22–32)
CREATININE: 2.17 mg/dL — AB (ref 0.44–1.00)
Calcium: 8.9 mg/dL (ref 8.9–10.3)
GFR, EST AFRICAN AMERICAN: 23 mL/min — AB (ref >60)
GFR, EST NON AFRICAN AMERICAN: 20 mL/min — AB (ref >60)
Glucose, Bld: 168 mg/dL — ABNORMAL HIGH (ref 65–99)
POTASSIUM: 3.8 mmol/L (ref 3.5–5.1)
Sodium: 137 mmol/L (ref 135–145)
TOTAL PROTEIN: 5.5 g/dL — AB (ref 6.5–8.1)

## 2016-03-03 MED ORDER — PHYTONADIONE 5 MG PO TABS
2.5000 mg | ORAL_TABLET | Freq: Once | ORAL | Status: AC
  Administered 2016-03-03: 2.5 mg via ORAL
  Filled 2016-03-03: qty 1

## 2016-03-03 MED ORDER — ISOSORBIDE MONONITRATE ER 30 MG PO TB24
15.0000 mg | ORAL_TABLET | Freq: Every day | ORAL | Status: DC
  Administered 2016-03-04: 15 mg via ORAL
  Filled 2016-03-03: qty 1

## 2016-03-03 NOTE — Progress Notes (Signed)
ANTICOAGULATION CONSULT NOTE - Follow Up Consult  Pharmacy Consult for Coumadin Indication: atrial fibrillation  Allergies  Allergen Reactions  . Yellow Dyes (Non-Tartrazine) Nausea And Vomiting    "deathly allergic" No other information given to explain reaction. Per Daughter- this was an IV dye. Ok with yellow coloring on Coumadin tablets.   . Codeine Hives  . Other Other (See Comments)    novocaine broke mouth out  . Penicillins Hives  . Procaine Hives    Patient Measurements: Height: 5\' 4"  (162.6 cm) Weight: 146 lb 9.7 oz (66.5 kg) IBW/kg (Calculated) : 54.7  Vital Signs: Temp: 98.2 F (36.8 C) (10/08 0856) Temp Source: Oral (10/08 0856) BP: 103/41 (10/08 0856) Pulse Rate: 75 (10/08 0856)  Labs:  Recent Labs  03/01/16 0416 03/01/16 1105 03/02/16 0407 03/02/16 0845 03/03/16 0401  HGB  --  8.6*  --  9.6*  --   HCT  --  27.3*  --  29.5*  --   PLT  --  212  --  261  --   LABPROT 51.0*  --  61.9*  --  78.1*  INR 5.41*  --  6.90*  --  9.22*  CREATININE  --  2.06*  --  2.01* 2.17*    Estimated Creatinine Clearance: 18.7 mL/min (by C-G formula based on SCr of 2.17 mg/dL (H)).  Assessment: 84yof on coumadin pta for afib. INR 2.71 on admission and home regimen resumed. Last dose taken 10/4. INR has since increased to supratherapeutic levels 4.46 > 5.41 > 6.9 > 9.2. She received vitamin k 2.5mg  this morning. Could be due to drug interactions with doxycycline (10/4>10/6) and now levaquin (10/7 > ). LFTs wnl. No CBC today. No bleeding.  Home regimen: 4mg  daily except 6mg  MWF  Goal of Therapy:  INR 2-3 Monitor platelets by anticoagulation protocol: Yes   Plan:  1) No coumadin tonight 2) Recheck INR/CBC this evening 3) Daily INR, CBC  Fredrik Rigger 03/03/2016,10:50 AM

## 2016-03-03 NOTE — Progress Notes (Signed)
PROGRESS NOTE    Alexis GivensChristine E [name]  ZOX:[id number] DOB: 12/21/1931 DOA: 02/28/2016  PCP: Alexis DanielBEAL, [name], PA-C   Brief Narrative:  Alexis [name] is a 82 y.o. female with medical history significant of hypertension, hyperlipidemia, diabetes mellitus, COPD, depression, anxiety, PAF on Coumadin, GIB, CKD-IV, macular degeneration, poor hearing, who presents with cough, chest pain and generalized weakness. Her son also states that she has been confused.   Subjective: No complaints.  Assessment & Plan:  Sepsis/ CAP and UTI - blood cultures NGTD, legionella still pending-  strep antigen negative  - Resp viral panel negative - U culture unhelpful as it shows multiple species - as infitrate is RUL, I ordered an MBS to ensure she has no aspiration issues- this is negative  - still had fever of 101 on 10/5 - repeat CXR 10/6 shows worsening RUL infiltrate and new LLL infiltrate- although this did not show on the initial CXR, I was able to hear LLL crackles on admission so suspect it was present at that time.  - continued to have low grade fevers--   Pro calcitonin normal- no other source of fever found - switched Rocephin and Doxy to Levaquin yesterday- fever resolved for now- follow   Active Problems:   Chronic diastolic heart failure, NYHA class 1 - compensated-  Torsemide resumed  -cont Toprol - follows with [facility]    Stage 4 chronic kidney disease (HCC) - baseline Cr ranges from 1.7 to 2.25  -at baseline now    Essential hypertension with hypertensive heart disease - Lopressor / Imdur/ Hydralazine - hypotensive- hold Hydralazine- holding parameters on Lopressor- decrease dose of Imdur    Atrial fibrillation, persistent - s/p cardioversion CHA2DS2-VASc Score 6 -Coumadin/ Toprol - HR stable  Supratherapeutic INR - pharmacy managing coumadin- vit K ordered today    COPD (chronic obstructive pulmonary disease)  - stable - cont nebs  - on 3 L O2 at home  AOCD -  stable  Diabetes mellitus  - Hb A1c 8.5 on 4/17 - does not appear to be on medications at home- her daughter confirms that her sugars were dropping on the medication and it was therefore stopped by her PCP - rechecked Hb A1c -7.4 - increased SSI  to moderate- will avoid tight control    Depression with anxiety - Lexapro  Pressure injury of skin-stage 2  - cont wound care for pressure ulcer  HLD  -Continue home medications: Lipitor and fenofibrate  Severe hypoalbuminemia - likely due to acute illness    DVT prophylaxis: Coumadin Code Status: DNR Family Communication: son and daughter Disposition Plan: home when stable Consultants:    Procedures:    Antimicrobials:  Anti-infectives    Start     Dose/Rate Route Frequency Ordered Stop   03/04/16 1200  levofloxacin (LEVAQUIN) tablet 250 mg     250 mg Oral Every 48 hours 03/02/16 0848     03/02/16 1200  levofloxacin (LEVAQUIN) tablet 500 mg     500 mg Oral Once 03/02/16 0848 03/02/16 1138   02/29/16 0600  cefTRIAXone (ROCEPHIN) 1 g in dextrose 5 % 50 mL IVPB  Status:  Discontinued     1 g 100 mL/hr over 30 Minutes Intravenous Every 24 hours 02/28/16 0345 03/02/16 0822   02/28/16 0500  doxycycline (VIBRA-TABS) tablet 100 mg  Status:  Discontinued     100 mg Oral Every 12 hours 02/28/16 0345 03/02/16 0822   02/28/16 0345  cefTRIAXone (ROCEPHIN) 1 g in dextrose 5 % 50  mL IVPB  Status:  Discontinued     1 g 100 mL/hr over 30 Minutes Intravenous Every 24 hours 02/28/16 0341 02/28/16 0400   02/28/16 0330  doxycycline (VIBRAMYCIN) 100 mg in dextrose 5 % 250 mL IVPB  Status:  Discontinued     100 mg 125 mL/hr over 120 Minutes Intravenous  Once 02/28/16 0302 02/28/16 0401   02/28/16 0315  cefTRIAXone (ROCEPHIN) 1 g in dextrose 5 % 50 mL IVPB     1 g 100 mL/hr over 30 Minutes Intravenous  Once 02/28/16 0302 02/28/16 0444       Objective: Vitals:   03/02/16 1752 03/02/16 2223 03/03/16 0404 03/03/16 0856  BP: (!) 119/45  (!) 111/51 (!) 112/45 (!) 103/41  Pulse: 69 78 86 75  Resp: 17 16 17 16   Temp: 98.2 F (36.8 C) 99.8 F (37.7 C) 98.9 F (37.2 C) 98.2 F (36.8 C)  TempSrc: Oral Oral Oral Oral  SpO2: 99% 98% 99% 98%  Weight:      Height:        Intake/Output Summary (Last 24 hours) at 03/03/16 1346 Last data filed at 03/03/16 1030  Gross per 24 hour  Intake              840 ml  Output               50 ml  Net              790 ml   Filed Weights   02/28/16 2123 02/29/16 2145 03/01/16 2100  Weight: 61.3 kg (135 lb 2.3 oz) 63 kg (138 lb 14.2 oz) 66.5 kg (146 lb 9.7 oz)    Examination: General exam: Appears comfortable  HEENT: PERRLA, oral mucosa moist, no sclera icterus or thrush Respiratory system: Clear to auscultation. Respiratory effort normal. Cardiovascular system: S1 & S2 heard, RRR.  No murmurs  Gastrointestinal system: Abdomen soft, non-tender, nondistended. Normal bowel sound. No organomegaly Central nervous system: Alert and oriented. No focal neurological deficits. Extremities: No cyanosis, clubbing or edema Skin: No rashes or ulcers Psychiatry:  Mood & affect appropriate.     Data Reviewed: I have personally reviewed following labs and imaging studies  CBC:  Recent Labs Lab 02/28/16 0151 02/29/16 0505 03/01/16 1105 03/02/16 0845  WBC 16.7* 13.3* 11.7* 12.9*  NEUTROABS 14.5*  --   --   --   HGB 10.5* 8.8* 8.6* 9.6*  HCT 32.4* 27.7* 27.3* 29.5*  MCV 85.0 85.2 84.8 85.5  PLT 210 188 212 261   Basic Metabolic Panel:  Recent Labs Lab 02/28/16 0151 02/29/16 0505 03/01/16 1105 03/02/16 0845 03/03/16 0401  NA 132* 136 135 138 137  K 3.8 3.7 4.2 4.1 3.8  CL 98* 99* 105 104 100*  CO2 24 24 24 25 25   GLUCOSE 318* 188* 322* 209* 168*  BUN 46* 43* 50* 55* 70*  CREATININE 2.25* 2.02* 2.06* 2.01* 2.17*  CALCIUM 9.3 8.8* 8.9 9.4 8.9   GFR: Estimated Creatinine Clearance: 18.7 mL/min (by C-G formula based on SCr of 2.17 mg/dL (H)). Liver Function Tests:  Recent  Labs Lab 03/03/16 0401  AST 21  ALT 16  ALKPHOS 114  BILITOT 0.6  PROT 5.5*  ALBUMIN 1.8*   No results for input(s): LIPASE, AMYLASE in the last 168 hours. No results for input(s): AMMONIA in the last 168 hours. Coagulation Profile:  Recent Labs Lab 02/28/16 0151 02/29/16 0505 03/01/16 0416 03/02/16 0407 03/03/16 0401  INR 2.71 4.46* 5.41* 6.90* 9.22*  Cardiac Enzymes: No results for input(s): CKTOTAL, CKMB, CKMBINDEX, TROPONINI in the last 168 hours. BNP (last 3 results) No results for input(s): PROBNP in the last 8760 hours. HbA1C: No results for input(s): HGBA1C in the last 72 hours. CBG:  Recent Labs Lab 03/02/16 1133 03/02/16 1601 03/02/16 2215 03/03/16 0740 03/03/16 1137  GLUCAP 256* 341* 203* 171* 257*   Lipid Profile: No results for input(s): CHOL, HDL, LDLCALC, TRIG, CHOLHDL, LDLDIRECT in the last 72 hours. Thyroid Function Tests: No results for input(s): TSH, T4TOTAL, FREET4, T3FREE, THYROIDAB in the last 72 hours. Anemia Panel: No results for input(s): VITAMINB12, FOLATE, FERRITIN, TIBC, IRON, RETICCTPCT in the last 72 hours. Urine analysis:    Component Value Date/Time   COLORURINE YELLOW 03/02/2016 0749   APPEARANCEUR CLEAR 03/02/2016 0749   LABSPEC 1.020 03/02/2016 0749   PHURINE 5.0 03/02/2016 0749   GLUCOSEU NEGATIVE 03/02/2016 0749   HGBUR TRACE (A) 03/02/2016 0749   BILIRUBINUR NEGATIVE 03/02/2016 0749   KETONESUR NEGATIVE 03/02/2016 0749   PROTEINUR 30 (A) 03/02/2016 0749   UROBILINOGEN 0.2 12/14/2014 1821   NITRITE NEGATIVE 03/02/2016 0749   LEUKOCYTESUR NEGATIVE 03/02/2016 0749   Sepsis Labs: @LABRCNTIP (procalcitonin:4,lacticidven:4) ) Recent Results (from the past 240 hour(s))  Blood culture (routine x 2)     Status: None (Preliminary result)   Collection Time: 02/28/16  3:06 AM  Result Value Ref Range Status   Specimen Description BLOOD RIGHT FOREARM  Final   Special Requests IN PEDIATRIC BOTTLE  Final   Culture NO  GROWTH 3 DAYS  Final   Report Status PENDING  Incomplete  Urine culture     Status: Abnormal   Collection Time: 02/28/16  3:08 AM  Result Value Ref Range Status   Specimen Description URINE, CLEAN CATCH  Final   Special Requests NONE  Final   Culture MULTIPLE SPECIES PRESENT, SUGGEST RECOLLECTION (A)  Final   Report Status 02/29/2016 FINAL  Final  Blood culture (routine x 2)     Status: None (Preliminary result)   Collection Time: 02/28/16  3:31 AM  Result Value Ref Range Status   Specimen Description BLOOD LEFT ARM  Final   Special Requests BOTTLES DRAWN AEROBIC AND ANAEROBIC  Final   Culture NO GROWTH 3 DAYS  Final   Report Status PENDING  Incomplete  Respiratory Panel by PCR     Status: None   Collection Time: 02/28/16  6:41 AM  Result Value Ref Range Status   Adenovirus NOT DETECTED NOT DETECTED Final   Coronavirus 229E NOT DETECTED NOT DETECTED Final   Coronavirus HKU1 NOT DETECTED NOT DETECTED Final   Coronavirus NL63 NOT DETECTED NOT DETECTED Final   Coronavirus OC43 NOT DETECTED NOT DETECTED Final   Metapneumovirus NOT DETECTED NOT DETECTED Final   Rhinovirus / Enterovirus NOT DETECTED NOT DETECTED Final   Influenza A NOT DETECTED NOT DETECTED Final   Influenza B NOT DETECTED NOT DETECTED Final   Parainfluenza Virus 1 NOT DETECTED NOT DETECTED Final   Parainfluenza Virus 2 NOT DETECTED NOT DETECTED Final   Parainfluenza Virus 3 NOT DETECTED NOT DETECTED Final   Parainfluenza Virus 4 NOT DETECTED NOT DETECTED Final   Respiratory Syncytial Virus NOT DETECTED NOT DETECTED Final   Bordetella pertussis NOT DETECTED NOT DETECTED Final   Chlamydophila pneumoniae NOT DETECTED NOT DETECTED Final   Mycoplasma pneumoniae NOT DETECTED NOT DETECTED Final         Radiology Studies: No results found.    Scheduled Meds: . atorvastatin  20 mg Oral q1800  . cholecalciferol  1,000 Units Oral q morning - 10a  . escitalopram  10 mg Oral Daily  . fenofibrate  160 mg Oral  Daily  . gabapentin  100 mg Oral QHS  . hydrALAZINE  25 mg Oral Q12H  . insulin aspart  0-15 Units Subcutaneous TID WC  . insulin aspart  0-5 Units Subcutaneous QHS  . iron polysaccharides  150 mg Oral Daily  . isosorbide mononitrate  30 mg Oral Daily  . [START ON 03/04/2016] levofloxacin  250 mg Oral Q48H  . mouth rinse  15 mL Mouth Rinse BID  . metoprolol tartrate  25 mg Oral BID  . multivitamin with minerals  1 tablet Oral QAC breakfast  . Warfarin - Pharmacist Dosing Inpatient   Does not apply q1800   Continuous Infusions:    LOS: 4 days    Time spent in minutes: 35    Ettel Albergo, MD [facility] Pager: [email or web address] Password [email or web address] 03/03/2016, 1:46 PM

## 2016-03-03 NOTE — Progress Notes (Signed)
Results for Alexis [name], Alexis [name] (MRN [id number]) as of 03/03/2016 06:48  Ref. Range 03/03/2016 04:01  INR Unknown 9.22 (HH)  MD on call paged. Awaiting return call.

## 2016-03-04 DIAGNOSIS — J439 Emphysema, unspecified: Secondary | ICD-10-CM

## 2016-03-04 DIAGNOSIS — N184 Chronic kidney disease, stage 4 (severe): Secondary | ICD-10-CM

## 2016-03-04 DIAGNOSIS — E784 Other hyperlipidemia: Secondary | ICD-10-CM

## 2016-03-04 DIAGNOSIS — R791 Abnormal coagulation profile: Secondary | ICD-10-CM

## 2016-03-04 LAB — CBC
HCT: 24.2 % — ABNORMAL LOW (ref 36.0–46.0)
HEMOGLOBIN: 7.6 g/dL — AB (ref 12.0–15.0)
MCH: 26.8 pg (ref 26.0–34.0)
MCHC: 31.4 g/dL (ref 30.0–36.0)
MCV: 85.2 fL (ref 78.0–100.0)
PLATELETS: 268 10*3/uL (ref 150–400)
RBC: 2.84 MIL/uL — ABNORMAL LOW (ref 3.87–5.11)
RDW: 16.3 % — AB (ref 11.5–15.5)
WBC: 7.5 10*3/uL (ref 4.0–10.5)

## 2016-03-04 LAB — GLUCOSE, CAPILLARY
GLUCOSE-CAPILLARY: 174 mg/dL — AB (ref 65–99)
GLUCOSE-CAPILLARY: 309 mg/dL — AB (ref 65–99)

## 2016-03-04 LAB — CULTURE, BLOOD (ROUTINE X 2)
CULTURE: NO GROWTH
CULTURE: NO GROWTH

## 2016-03-04 LAB — PROTIME-INR
INR: 3.46
PROTHROMBIN TIME: 35.7 s — AB (ref 11.4–15.2)

## 2016-03-04 MED ORDER — WARFARIN SODIUM 1 MG PO TABS
1.0000 mg | ORAL_TABLET | Freq: Once | ORAL | Status: DC
  Filled 2016-03-04: qty 1

## 2016-03-04 MED ORDER — LEVOFLOXACIN 250 MG PO TABS: 250.0000 mg | ORAL_TABLET | ORAL | 0 refills | Status: DC

## 2016-03-04 NOTE — Discharge Summary (Signed)
Physician Discharge Summary  Alexis [name] ZOX:[id number] DOB: 02-16-1932 DOA: 02/28/2016  PCP: [name], PA-C  Admit date: 02/28/2016 Discharge date: 03/04/2016  Admitted From: home  Disposition:  home   Recommendations for Outpatient Follow-up:  1. Repeat CXR in 2 wks for clearance of infiltrates 2. INR in 2 days 3. Bmet in 2 days to f/u renal function 4. F/u BP- held Hydralazine and Imdur due to hypotension  Home Health:  ordered  Equipment/Devices:  none  Discharge Condition:  stable   CODE STATUS:  DNR    Diet recommendation:  Diabetic, Heart healthy Consultations:  none    Discharge Diagnoses:  Principal Problem:   Sepsis (HCC) Active Problems:   CAP (community acquired pneumonia)   UTI (urinary tract infection)   Supratherapeutic INR   Chronic diastolic heart failure, NYHA class 1 (HCC)   Essential hypertension   (HFpEF) heart failure with preserved ejection fraction (HCC)   COPD (chronic obstructive pulmonary disease) (HCC)   Long-term (current) use of anticoagulants   Diabetes mellitus with complication (HCC)   Atrial fibrillation, persistent (HCC)   Depression with anxiety   HLD (hyperlipidemia)   Pressure injury of skin   CKD (chronic kidney disease) stage 4, GFR 15-29 ml/min (HCC)    Subjective: No cough, dyspnea at rest or with exertion, dysuria, lightheaded feeling with walking, nausea, vomiting, diarrhea.   Brief Summary: Alexis [name] a 82 y.o.femalewith medical history significant of hypertension, hyperlipidemia, diabetes mellitus, COPD, depression, anxiety, PAF on Coumadin, GIB, CKD-IV, macular degeneration, poor hearing, who presents with cough, chest pain and generalized weakness. Her son also states that she has been confused.   Hospital Course:  Sepsis/ CAP and UTI - blood cultures NGTD, legionella still pending-  strep antigen negative  - Resp viral panel negative - U culture unhelpful as it shows multiple species -  as her pulmonary infitrate is RUL, I ordered an MBS to ensure she has no aspiration issues- this was negative for aspiration - still had fever of 101 on 10/5 - repeat CXR 10/6 shows worsening RUL infiltrate and new LLL infiltrate- although this did not show on the initial CXR, I was able to hear LLL crackles on admission so suspect it was present at that time.  - continued to have low grade fevers on 10/6-7--   Pro calcitonin normal- no other source of fever found - switched Rocephin and Doxy to Levaquin on 10/7 - fever resolved- no new symptoms- will complete a course of Levaquin   Active Problems: Chronic diastolic heart failure, NYHA class 1 - compensated- Torsemide initially held due to poor PO intake and later resumed  -cont Toprol - follows with [facility]  Stage 4 chronic kidney disease (HCC) - baseline Cr ranges from 1.7 to 2.25 -at baseline now  Essential hypertension with hypertensive heart disease - Lopressor / Imdur/ Hydralazine at home - hypotensive in low 100s-  hold Hydralazine & Imdur  cont Toprol for rate control  Atrial fibrillation, persistent - s/p cardioversion CHA2DS2-VASc Score 6 -Coumadin/ Toprol at home - HR stable  Supratherapeutic INR - pharmacy managing coumadin- vit K ordered 10/8  COPD (chronic obstructive pulmonary disease)  - stable - cont nebs  - on 3 L O2 at home  AOCD - stable  Diabetes mellitus  - Hb A1c 8.5 on 4/17 - does not appear to be on medications at home- her daughter confirms that her sugars were dropping on the medication and it was therefore stopped by her PCP -  rechecked Hb A1c -7.4 - increased SSI  to moderate- will avoid tight control  Depression with anxiety - Lexapro  Pressure injury of skin-stage 2  - cont wound care for pressure ulcer  HLD  -Continue home medications: Lipitor and fenofibrate  Severe hypoalbuminemia - likely due to acute illness   Discharge Instructions  Discharge  Instructions    (HEART FAILURE PATIENTS) Call MD:  Anytime you have any of the following symptoms: 1) 3 pound weight gain in 24 hours or 5 pounds in 1 week 2) shortness of breath, with or without a dry hacking cough 3) swelling in the hands, feet or stomach 4) if you have to sleep on extra pillows at night in order to breathe.    Complete by:  As directed    Discharge instructions    Complete by:  As directed    Please have your PCP check your INR (coumadin level) and Bmet in 2 days. Do not take Coumadin for now. Low sodium, heart healthy, diabetic diet   Increase activity slowly    Complete by:  As directed        Medication List    STOP taking these medications   hydrALAZINE 25 MG tablet Commonly known as:  APRESOLINE   isosorbide mononitrate 30 MG 24 hr tablet Commonly known as:  IMDUR   warfarin 4 MG tablet Commonly known as:  COUMADIN     TAKE these medications   albuterol 108 (90 Base) MCG/ACT inhaler Commonly known as:  PROVENTIL HFA;VENTOLIN HFA Inhale 2 puffs into the lungs every 6 (six) hours as needed for wheezing or shortness of breath.   atorvastatin 20 MG tablet Commonly known as:  LIPITOR Take 20 mg by mouth daily.   cholecalciferol 1000 units tablet Commonly known as:  VITAMIN D Take 1,000 Units by mouth every morning.   docusate sodium 100 MG capsule Commonly known as:  COLACE Take 100 mg by mouth 2 (two) times daily as needed for mild constipation.   escitalopram 10 MG tablet Commonly known as:  LEXAPRO Take 10 mg by mouth daily.   fenofibrate 160 MG tablet Take 160 mg by mouth daily.   gabapentin 100 MG capsule Commonly known as:  NEURONTIN Take 1 capsule (100 mg total) by mouth at bedtime.   iron polysaccharides 150 MG capsule Commonly known as:  NIFEREX Take 1 capsule (150 mg total) by mouth daily.   levofloxacin 250 MG tablet Commonly known as:  LEVAQUIN Take 1 tablet (250 mg total) by mouth every other day. Start taking on:   03/06/2016   metoprolol tartrate 25 MG tablet Commonly known as:  LOPRESSOR Take 1 tablet (25 mg total) by mouth 2 (two) times daily.   multivitamin with minerals Tabs tablet Take 1 tablet by mouth daily.   OXYGEN Inhale 3 L into the lungs at bedtime.   polyethylene glycol packet Commonly known as:  MIRALAX / GLYCOLAX Take 17 g by mouth daily as needed for mild constipation.   potassium chloride SA 20 MEQ tablet Commonly known as:  K-DUR,KLOR-CON Take 20 mEq by mouth daily.   torsemide 20 MG tablet Commonly known as:  DEMADEX TAKE 2 TABLETS BY MOUTH EVERY DAY      Follow-up Information    BEAL, [name], PA-C .   Specialty:  Physician Assistant Why:  Need INR check and Bmet.  Contact information: [address] [place] [address] [phone]          Allergies  Allergen Reactions  .  Yellow Dyes (Non-Tartrazine) Nausea And Vomiting    "deathly allergic" No other information given to explain reaction. Per Daughter- this was an IV dye. Ok with yellow coloring on Coumadin tablets.   . Codeine Hives  . Other Other (See Comments)    novocaine broke mouth out  . Penicillins Hives  . Procaine Hives     Procedures/Studies:    Dg Chest 2 View  Result Date: 03/01/2016 CLINICAL DATA:  Fever, history of diabetes, CHF, COPD EXAM: CHEST  2 VIEW COMPARISON:  PA and lateral chest x-ray of February 28, 2016 and portable chest x-ray of June 11, 2015. FINDINGS: Alveolar opacity with air bronchograms have become more conspicuous in the right upper lobe. There is patchy density in the left lower lobe which appears new. Small bilateral pleural effusions blunt the costophrenic angles posteriorly. The cardiac silhouette is enlarged. The central pulmonary vascularity is engorged. The observed bony thorax exhibits no acute abnormality. There is chronic deformity of multiple left lateral ribs. IMPRESSION: Worsening pneumonia in the right upper lobe with new infiltrate in the left  lower lobe. Small left pleural effusion. Cardiomegaly with mild central pulmonary vascular engorgement. Electronically Signed   By: David  [place] M.D.   On: 03/01/2016 10:38   Dg Chest 2 View  Result Date: 02/28/2016 CLINICAL DATA:  RIGHT-sided chest pain tonight.  History of COPD. EXAM: CHEST  2 VIEW COMPARISON:  Chest radiograph February 27, 2016 FINDINGS: Unchanged rounded airspace opacity RIGHT upper lobe. Mild interstitial prominence without pleural effusion. Cardiac silhouette is mildly enlarged. Calcified aortic knob. No pneumothorax. Osteopenia. Old LEFT rib fractures. IMPRESSION: Unchanged RIGHT upper lobe bronchopneumonia. Followup PA and lateral chest X-ray is recommended in 3-4 weeks following trial of antibiotic therapy to ensure resolution and exclude underlying malignancy. Mild cardiomegaly. Electronically Signed   By: Awilda Metro M.D.   On: 02/28/2016 02:31   Dg Chest 2 View  Result Date: 02/27/2016 CLINICAL DATA:  Fatigue, confusion, right chest pain. EXAM: CHEST  2 VIEW COMPARISON:  11/04/2015 FINDINGS: Rounded opacity noted in the right upper lobe. Given the recent normal appearance of this area, this is likely pneumonia. Underlying mild hyperinflation of the lungs. Heart is mildly enlarged. No confluent opacity on the left or effusion. No acute bony abnormality. IMPRESSION: Rounded opacity in the right upper lobe, likely pneumonia. Followup PA and lateral chest X-ray is recommended in 3-4 weeks following trial of antibiotic therapy to ensure resolution and exclude underlying malignancy. Hyperinflation/COPD.  Cardiomegaly. Electronically Signed   By: Charlett Nose M.D.   On: 02/27/2016 16:00   Dg Swallowing Func-speech Pathology  Result Date: 03/01/2016 Objective Swallowing Evaluation: Type of Study: MBS-Modified Barium Swallow Study Patient Details Name: TASHANA HABERL MRN: [id number] Date of Birth: 1932-05-02 Today's Date: 03/01/2016 Time: SLP Start Time (ACUTE ONLY): 1237-SLP  Stop Time (ACUTE ONLY): 1258 SLP Time Calculation (min) (ACUTE ONLY): 21 min Past Medical History: Past Medical History: Diagnosis Date . Anxiety  . Chronic diastolic CHF (congestive heart failure) (HCC)   a. 05/2015 Echo: EF 55-60%, mild LVH, no rwma, mild MR, mildly dil RA, mod TR, PASP . . CKD (chronic kidney disease), stage IV (HCC)  . Confusion state  . COPD (chronic obstructive pulmonary disease) (HCC)   a. on home O2 @ 3lpm. . Elevated troponin   a. H/o elevated trop in setting of CHF; b. 06/2013 Lexiscan MV: nl EF, mild anterior breast attenuation, no ischemia.  . Family history of adverse reaction to anesthesia  . GIB (gastrointestinal  bleeding)   a. History of GIB. Marland Kitchen Hypertensive heart disease  . Macular degeneration, bilateral  . Neuropathy (HCC)  . Paroxysmal atrial fibrillation (HCC)   a. CHA2DS2VASc = 6-->coumadin. Marland Kitchen PONV (postoperative nausea and vomiting)  . Type II diabetes mellitus (HCC)  . Varicose veins  Past Surgical History: Past Surgical History: Procedure Laterality Date . ABDOMINAL HYSTERECTOMY   . CARDIOVERSION N/A 09/22/2015  Procedure: CARDIOVERSION;  Surgeon: Chrystie Nose, MD;  Location: [facility] ENDOSCOPY;  Service: Cardiovascular;  Laterality: N/A; . ESOPHAGOGASTRODUODENOSCOPY N/A 12/01/2014  Procedure: ESOPHAGOGASTRODUODENOSCOPY (EGD);  Surgeon: Hilarie Fredrickson, MD;  Location: [facility] ENDOSCOPY;  Service: Endoscopy;  Laterality: N/A; . HIP FRACTURE SURGERY  2004 . VEIN SURGERY   HPI: MARLENE BEIDLER is a 82 y.o. female with medical history significant of hypertension, hyperlipidemia, diabetes mellitus, COPD, depression, anxiety, PAF on Coumadin, GIB, CKD-IV, macular degeneration, poor hearing, who presents with cough, chest pain and generalized weakness. No Data Recorded Assessment / Plan / Recommendation CHL IP CLINICAL IMPRESSIONS 02/28/2016 Therapy Diagnosis WFL Clinical Impression Pt demosntrates functional swallow iwth slight oral holding prior to swallow initiation, though airway  closure and transit timely. Barium tablet did not pass mid esophagus without a bite of puree to aid in transit. Pt states this is her baseline and she often takes pills whole in puree. Pt may continue current diet, no SLP f/u needed, will sign off.  Impact on safety and function Mild aspiration risk   CHL IP TREATMENT RECOMMENDATION 02/28/2016 Treatment Recommendations No treatment recommended at this time   No flowsheet data found. CHL IP DIET RECOMMENDATION 02/28/2016 SLP Diet Recommendations Regular solids;Thin liquid Liquid Administration via Cup;Straw Medication Administration Whole meds with puree Compensations -- Postural Changes Seated upright at 90 degrees   CHL IP OTHER RECOMMENDATIONS 02/28/2016 Recommended Consults -- Oral Care Recommendations Oral care BID Other Recommendations --   CHL IP FOLLOW UP RECOMMENDATIONS 02/28/2016 Follow up Recommendations None   No flowsheet data found.     CHL IP ORAL PHASE 02/28/2016 Oral Phase Impaired Oral - Pudding Teaspoon -- Oral - Pudding Cup -- Oral - Honey Teaspoon -- Oral - Honey Cup -- Oral - Nectar Teaspoon -- Oral - Nectar Cup -- Oral - Nectar Straw -- Oral - Thin Teaspoon -- Oral - Thin Cup Delayed oral transit;Holding of bolus Oral - Thin Straw Holding of bolus;Delayed oral transit Oral - Puree Holding of bolus;Delayed oral transit Oral - Mech Soft Holding of bolus;Delayed oral transit Oral - Regular -- Oral - Multi-Consistency -- Oral - Pill Holding of bolus;Delayed oral transit Oral Phase - Comment --  CHL IP PHARYNGEAL PHASE 02/28/2016 Pharyngeal Phase WFL Pharyngeal- Pudding Teaspoon -- Pharyngeal -- Pharyngeal- Pudding Cup -- Pharyngeal -- Pharyngeal- Honey Teaspoon -- Pharyngeal -- Pharyngeal- Honey Cup -- Pharyngeal -- Pharyngeal- Nectar Teaspoon -- Pharyngeal -- Pharyngeal- Nectar Cup -- Pharyngeal -- Pharyngeal- Nectar Straw -- Pharyngeal -- Pharyngeal- Thin Teaspoon -- Pharyngeal -- Pharyngeal- Thin Cup -- Pharyngeal -- Pharyngeal- Thin Straw --  Pharyngeal -- Pharyngeal- Puree -- Pharyngeal -- Pharyngeal- Mechanical Soft -- Pharyngeal -- Pharyngeal- Regular -- Pharyngeal -- Pharyngeal- Multi-consistency -- Pharyngeal -- Pharyngeal- Pill -- Pharyngeal -- Pharyngeal Comment --  CHL IP CERVICAL ESOPHAGEAL PHASE 02/28/2016 Cervical Esophageal Phase WFL Pudding Teaspoon -- Pudding Cup -- Honey Teaspoon -- Honey Cup -- Nectar Teaspoon -- Nectar Cup -- Nectar Straw -- Thin Teaspoon -- Thin Cup -- Thin Straw -- Puree -- Mechanical Soft -- Regular -- Multi-consistency -- Pill -- Cervical Esophageal Comment --  No flowsheet data found. Maxcine Ham 03/01/2016, 9:21 AM  Note populated for [employer], SLP Maxcine Ham, M.A. CCC-SLP [phone]                 Discharge Exam: Vitals:   03/04/16 0549 03/04/16 0900  BP: (!) 103/46 (!) 103/38  Pulse: 75 79  Resp: 16 18  Temp: 98.3 F (36.8 C) 97.9 F (36.6 C)   Vitals:   03/03/16 1733 03/03/16 2144 03/04/16 0549 03/04/16 0900  BP: (!) 114/46 (!) 102/42 (!) 103/46 (!) 103/38  Pulse: 64 61 75 79  Resp: 18 17 16 18   Temp: 97.9 F (36.6 C) 97.9 F (36.6 C) 98.3 F (36.8 C) 97.9 F (36.6 C)  TempSrc: Oral Oral Oral Oral  SpO2: 100% 100% 100% 95%  Weight:      Height:        General: Pt is alert, awake, not in acute distress Cardiovascular: RRR, S1/S2 +, no rubs, no gallops Respiratory: CTA bilaterally, no wheezing, no rhonchi Abdominal: Soft, NT, ND, bowel sounds + Extremities: no edema, no cyanosis    The results of significant diagnostics from this hospitalization (including imaging, microbiology, ancillary and laboratory) are listed below for reference.     Microbiology: Recent Results (from the past 240 hour(s))  Blood culture (routine x 2)     Status: None   Collection Time: 02/28/16  3:06 AM  Result Value Ref Range Status   Specimen Description BLOOD RIGHT FOREARM  Final   Special Requests IN PEDIATRIC BOTTLE  Final   Culture NO GROWTH 5 DAYS  Final    Report Status 03/04/2016 FINAL  Final  Urine culture     Status: Abnormal   Collection Time: 02/28/16  3:08 AM  Result Value Ref Range Status   Specimen Description URINE, CLEAN CATCH  Final   Special Requests NONE  Final   Culture MULTIPLE SPECIES PRESENT, SUGGEST RECOLLECTION (A)  Final   Report Status 02/29/2016 FINAL  Final  Blood culture (routine x 2)     Status: None   Collection Time: 02/28/16  3:31 AM  Result Value Ref Range Status   Specimen Description BLOOD LEFT ARM  Final   Special Requests BOTTLES DRAWN AEROBIC AND ANAEROBIC  Final   Culture NO GROWTH 5 DAYS  Final   Report Status 03/04/2016 FINAL  Final  Respiratory Panel by PCR     Status: None   Collection Time: 02/28/16  6:41 AM  Result Value Ref Range Status   Adenovirus NOT DETECTED NOT DETECTED Final   Coronavirus 229E NOT DETECTED NOT DETECTED Final   Coronavirus HKU1 NOT DETECTED NOT DETECTED Final   Coronavirus NL63 NOT DETECTED NOT DETECTED Final   Coronavirus OC43 NOT DETECTED NOT DETECTED Final   Metapneumovirus NOT DETECTED NOT DETECTED Final   Rhinovirus / Enterovirus NOT DETECTED NOT DETECTED Final   Influenza A NOT DETECTED NOT DETECTED Final   Influenza B NOT DETECTED NOT DETECTED Final   Parainfluenza Virus 1 NOT DETECTED NOT DETECTED Final   Parainfluenza Virus 2 NOT DETECTED NOT DETECTED Final   Parainfluenza Virus 3 NOT DETECTED NOT DETECTED Final   Parainfluenza Virus 4 NOT DETECTED NOT DETECTED Final   Respiratory Syncytial Virus NOT DETECTED NOT DETECTED Final   Bordetella pertussis NOT DETECTED NOT DETECTED Final   Chlamydophila pneumoniae NOT DETECTED NOT DETECTED Final   Mycoplasma pneumoniae NOT DETECTED NOT DETECTED Final     Labs: BNP (last 3 results)  Recent Labs  09/22/15 0237 10/21/15 1056 02/28/16 0407  BNP 931.9* 1,283.4* 890.2*   Basic Metabolic Panel:  Recent Labs Lab 02/28/16 0151 02/29/16 0505 03/01/16 1105 03/02/16 0845 03/03/16 0401  NA 132* 136 135 138  137  K 3.8 3.7 4.2 4.1 3.8  CL 98* 99* 105 104 100*  CO2 24 24 24 25 25   GLUCOSE 318* 188* 322* 209* 168*  BUN 46* 43* 50* 55* 70*  CREATININE 2.25* 2.02* 2.06* 2.01* 2.17*  CALCIUM 9.3 8.8* 8.9 9.4 8.9   Liver Function Tests:  Recent Labs Lab 03/03/16 0401  AST 21  ALT 16  ALKPHOS 114  BILITOT 0.6  PROT 5.5*  ALBUMIN 1.8*   No results for input(s): LIPASE, AMYLASE in the last 168 hours. No results for input(s): AMMONIA in the last 168 hours. CBC:  Recent Labs Lab 02/28/16 0151 02/29/16 0505 03/01/16 1105 03/02/16 0845 03/03/16 1529 03/04/16 0523  WBC 16.7* 13.3* 11.7* 12.9* 9.7 7.5  NEUTROABS 14.5*  --   --   --   --   --   HGB 10.5* 8.8* 8.6* 9.6* 8.1* 7.6*  HCT 32.4* 27.7* 27.3* 29.5* 26.0* 24.2*  MCV 85.0 85.2 84.8 85.5 85.0 85.2  PLT 210 188 212 261 289 268   Cardiac Enzymes: No results for input(s): CKTOTAL, CKMB, CKMBINDEX, TROPONINI in the last 168 hours. BNP: Invalid input(s): POCBNP CBG:  Recent Labs Lab 03/03/16 1137 03/03/16 1623 03/03/16 2136 03/04/16 0752 03/04/16 1203  GLUCAP 257* 240* 298* 174* 309*   D-Dimer No results for input(s): DDIMER in the last 72 hours. Hgb A1c No results for input(s): HGBA1C in the last 72 hours. Lipid Profile No results for input(s): CHOL, HDL, LDLCALC, TRIG, CHOLHDL, LDLDIRECT in the last 72 hours. Thyroid function studies No results for input(s): TSH, T4TOTAL, T3FREE, THYROIDAB in the last 72 hours.  Invalid input(s): FREET3 Anemia work up No results for input(s): VITAMINB12, FOLATE, FERRITIN, TIBC, IRON, RETICCTPCT in the last 72 hours. Urinalysis    Component Value Date/Time   COLORURINE YELLOW 03/02/2016 0749   APPEARANCEUR CLEAR 03/02/2016 0749   LABSPEC 1.020 03/02/2016 0749   PHURINE 5.0 03/02/2016 0749   GLUCOSEU NEGATIVE 03/02/2016 0749   HGBUR TRACE (A) 03/02/2016 0749   BILIRUBINUR NEGATIVE 03/02/2016 0749   KETONESUR NEGATIVE 03/02/2016 0749   PROTEINUR 30 (A) 03/02/2016 0749    UROBILINOGEN 0.2 12/14/2014 1821   NITRITE NEGATIVE 03/02/2016 0749   LEUKOCYTESUR NEGATIVE 03/02/2016 0749   Sepsis Labs Invalid input(s): PROCALCITONIN,  WBC,  LACTICIDVEN Microbiology Recent Results (from the past 240 hour(s))  Blood culture (routine x 2)     Status: None   Collection Time: 02/28/16  3:06 AM  Result Value Ref Range Status   Specimen Description BLOOD RIGHT FOREARM  Final   Special Requests IN PEDIATRIC BOTTLE  Final   Culture NO GROWTH 5 DAYS  Final   Report Status 03/04/2016 FINAL  Final  Urine culture     Status: Abnormal   Collection Time: 02/28/16  3:08 AM  Result Value Ref Range Status   Specimen Description URINE, CLEAN CATCH  Final   Special Requests NONE  Final   Culture MULTIPLE SPECIES PRESENT, SUGGEST RECOLLECTION (A)  Final   Report Status 02/29/2016 FINAL  Final  Blood culture (routine x 2)     Status: None   Collection Time: 02/28/16  3:31 AM  Result Value Ref Range Status   Specimen Description BLOOD LEFT ARM  Final   Special Requests BOTTLES DRAWN AEROBIC  AND ANAEROBIC 5ML  Final   Culture NO GROWTH 5 DAYS  Final   Report Status 03/04/2016 FINAL  Final  Respiratory Panel by PCR     Status: None   Collection Time: 02/28/16  6:41 AM  Result Value Ref Range Status   Adenovirus NOT DETECTED NOT DETECTED Final   Coronavirus 229E NOT DETECTED NOT DETECTED Final   Coronavirus HKU1 NOT DETECTED NOT DETECTED Final   Coronavirus NL63 NOT DETECTED NOT DETECTED Final   Coronavirus OC43 NOT DETECTED NOT DETECTED Final   Metapneumovirus NOT DETECTED NOT DETECTED Final   Rhinovirus / Enterovirus NOT DETECTED NOT DETECTED Final   Influenza A NOT DETECTED NOT DETECTED Final   Influenza B NOT DETECTED NOT DETECTED Final   Parainfluenza Virus 1 NOT DETECTED NOT DETECTED Final   Parainfluenza Virus 2 NOT DETECTED NOT DETECTED Final   Parainfluenza Virus 3 NOT DETECTED NOT DETECTED Final   Parainfluenza Virus 4 NOT DETECTED NOT DETECTED Final    Respiratory Syncytial Virus NOT DETECTED NOT DETECTED Final   Bordetella pertussis NOT DETECTED NOT DETECTED Final   Chlamydophila pneumoniae NOT DETECTED NOT DETECTED Final   Mycoplasma pneumoniae NOT DETECTED NOT DETECTED Final     Time coordinating discharge: Over 30 minutes  SIGNED:   Calvert CantorIZWAN,Rainn Bullinger, MD  [facility] 03/04/2016, 12:39 PM Pager   If 7PM-7AM, please contact night-coverage [email or web address] Password [email or web address]

## 2016-03-04 NOTE — Progress Notes (Signed)
Patient discharged to home. All instructions reviewed with patient and daughter. Meds reviewed. Patient is aware to make an appt with INR clinic to recheck blood work and not resume Warfarin until then.  IV removed. Telemetry removed. Patient left unit in stable with all belongings in wheelchair.  Avelina Laine RN

## 2016-03-04 NOTE — Care Management Important Message (Signed)
Important Message  Patient Details  Name: Alexis [name] MRN: [id number] Date of Birth: May 04, 1932   Medicare Important Message Given:  Yes    Laurell Coalson 03/04/2016, 12:19 PM

## 2016-03-04 NOTE — Progress Notes (Addendum)
Inpatient Diabetes Program Recommendations  AACE/ADA: New Consensus Statement on Inpatient Glycemic Control (2015)  Target Ranges:  Prepandial:   less than 140 mg/dL      Peak postprandial:   less than 180 mg/dL (1-2 hours)      Critically ill patients:  140 - 180 mg/dL   Lab Results  Component Value Date   GLUCAP 174 (H) 03/04/2016   HGBA1C 7.4 (H) 02/29/2016   Review of Glycemic Control  Diabetes history: DM 2 Outpatient Diabetes medications: None Current orders for Inpatient glycemic control: Novolog Moderate + HS scale  Inpatient Diabetes Program Recommendations:   Glucose increased to 298 mg/dl at meal times yesterday. Please consider Novolog 3 units TID meal coverage if patient consumes at least 50% of meals.   Note that patient was not on medications for diabetes prior to admit.  May consider adding oral agent such as DPP-4.  While in hospital consider Tradjenta 5 mg daily.  Upon discharge may need to change to Januvia 25 mg daily (renal dosing for CrCl<30) due to medication/insurance coverage/cost.  Thanks,  Christena Deem RN, MSN, [facility] Inpatient Diabetes Coordinator Team Pager [phone] (8a-5p)

## 2016-03-04 NOTE — Progress Notes (Addendum)
ANTICOAGULATION CONSULT NOTE - Follow Up Consult  Pharmacy Consult for Coumadin Indication: atrial fibrillation  Allergies  Allergen Reactions  . Yellow Dyes (Non-Tartrazine) Nausea And Vomiting    "deathly allergic" No other information given to explain reaction. Per Daughter- this was an IV dye. Ok with yellow coloring on Coumadin tablets.   . Codeine Hives  . Other Other (See Comments)    novocaine broke mouth out  . Penicillins Hives  . Procaine Hives    Patient Measurements: Height: 5\' 4"  (162.6 cm) Weight: 146 lb 9.7 oz (66.5 kg) IBW/kg (Calculated) : 54.7  Vital Signs: Temp: 97.9 F (36.6 C) (10/09 0900) Temp Source: Oral (10/09 0900) BP: 103/38 (10/09 0900) Pulse Rate: 79 (10/09 0900)  Labs:  Recent Labs  03/02/16 0845 03/03/16 0401 03/03/16 1529 03/04/16 0523  HGB 9.6*  --  8.1* 7.6*  HCT 29.5*  --  26.0* 24.2*  PLT 261  --  289 268  LABPROT  --  78.1* 67.8* 35.7*  INR  --  9.22* 7.74* 3.46  CREATININE 2.01* 2.17*  --   --     Estimated Creatinine Clearance: 18.7 mL/min (by C-G formula based on SCr of 2.17 mg/dL (H)).  Assessment: 84yof on coumadin pta for h/o PAF. INR 2.71 on admission 02/28/16 and home regimen resumed. Last dose taken 10/4. INR has since increased to supratherapeutic levels 4.46 > 5.41 > 6.9 > 9.2>7.74. Today the INR is 3.46 -She received vitamin k 2.5mg  PO yesterday AM.  SUPRAtherapeutic INRs could be due to drug interactions with doxycycline (10/4>10/6) and now levaquin (10/7 > ). LFTs wnl. Hgb dropped to 7.6.  PLTC is within normal at 268k.  No bleeding.  Home regimen: 4mg  daily except 6mg  MWF  Goal of Therapy:  INR 2-3 Monitor platelets by anticoagulation protocol: Yes   Plan:  Give low dose Coumadin 1 mg po tonight. Daily INR, CBC  Noah Delaine, RPh Clinical Pharmacist Pager: [phone] 03/04/2016,11:50 AM

## 2016-03-04 NOTE — Progress Notes (Signed)
Physical Therapy Treatment Patient Details Name: Alexis GivensChristine E [name] MRN: [id number] DOB: 06/04/1931 Today's Date: 03/04/2016    History of Present Illness Alexis [name] is a 82 y.o. female with medical history significant of hypertension, hyperlipidemia, diabetes mellitus, COPD, depression, anxiety, PAF on Coumadin, GIB, CKD-IV, macular degeneration, poor hearing, who presents with cough, chest pain and generalized weakness.    PT Comments    Pt able to increase ambulation compared to last session. Needs cues for safety.  Con't to recommend 24 hour S and HHPT.  Follow Up Recommendations  Home health PT;Supervision/Assistance - 24 hour     Equipment Recommendations  None recommended by PT    Recommendations for Other Services       Precautions / Restrictions Precautions Precautions: Fall Restrictions Weight Bearing Restrictions: No    Mobility  Bed Mobility Overal bed mobility: Needs Assistance Bed Mobility: Supine to Sit     Supine to sit: Min assist     General bed mobility comments: MIN A to get hips fully turned. Pt states she sleeps in recliner at home.  Transfers Overall transfer level: Needs assistance Equipment used: Rolling walker (2 wheeled) Transfers: Sit to/from Stand Sit to Stand: Min assist         General transfer comment: Stood from bed and BSC with MIN A  Ambulation/Gait Ambulation/Gait assistance: ArchitectMin assist Ambulation Distance (Feet): 50 Feet Assistive device: Rolling walker (2 wheeled) Gait Pattern/deviations: Step-through pattern;Drifts right/left;Decreased step length - right;Decreased step length - left;Trunk flexed Gait velocity: decreased Gait velocity interpretation: Below normal speed for age/gender General Gait Details: Cues for RW placement and tends to veer R.     Stairs            Wheelchair Mobility    Modified Rankin (Stroke Patients Only)       Balance Overall balance assessment: Needs assistance    Sitting balance-Leahy Scale: Fair       Standing balance-Leahy Scale: Poor                      Cognition Arousal/Alertness: Awake/alert Behavior During Therapy: WFL for tasks assessed/performed Overall Cognitive Status: History of cognitive impairments - at baseline       Memory: Decreased short-term memory              Exercises      General Comments General comments (skin integrity, edema, etc.): Pt demonstrated ankle pumps and SLR when asked about her exercise program.  Encouraged to continue with exercises      Pertinent Vitals/Pain Pain Assessment: No/denies pain    Home Living                      Prior Function            PT Goals (current goals can now be found in the care plan section) Acute Rehab PT Goals Patient Stated Goal: to feel better PT Goal Formulation: With patient Time For Goal Achievement: 03/13/16 Potential to Achieve Goals: Good Progress towards PT goals: Progressing toward goals    Frequency    Min 3X/week      PT Plan Current plan remains appropriate    Co-evaluation             End of Session Equipment Utilized During Treatment: Gait belt;Oxygen Activity Tolerance: Patient tolerated treatment well Patient left: in chair;with call bell/phone within reach;with chair alarm set     Time: 1020-1043 (no charge for 5 mins  sitting on BSC trying to urinate) PT Time Calculation (min) (ACUTE ONLY): 23 min  Charges:  $Gait Training: 8-22 mins                    G Codes:      Alexis [name] 03/04/2016, 10:51 AM

## 2016-03-07 ENCOUNTER — Telehealth: Payer: Self-pay | Admitting: Pharmacist

## 2016-03-07 LAB — LEGIONELLA PNEUMOPHILA SEROGP 1 UR AG: L. PNEUMOPHILA SEROGP 1 UR AG: NEGATIVE

## 2016-03-07 NOTE — Telephone Encounter (Signed)
Called to report patient has been in hospital. She was supposed to come for visit on Friday for Coumadin check, but she is going to primary care because she is not doing well since discharge. Daughter seems to think that patient will be readmitted.   Daughter reports INR was as high as 9 in the hospital. She was told not to take coumadin until visit with [place]. She will remain off coumadin until our visit on Tuesday (date appt moved to).   Will route to Dr. [name] (pt cardiologist as [facility]).

## 2016-03-08 NOTE — Telephone Encounter (Signed)
Thanks Kelley.  Dr. H 

## 2016-03-12 ENCOUNTER — Ambulatory Visit (INDEPENDENT_AMBULATORY_CARE_PROVIDER_SITE_OTHER): Payer: Medicare Other | Admitting: Pharmacist

## 2016-03-12 DIAGNOSIS — Z7901 Long term (current) use of anticoagulants: Secondary | ICD-10-CM

## 2016-03-12 DIAGNOSIS — I481 Persistent atrial fibrillation: Secondary | ICD-10-CM

## 2016-03-12 DIAGNOSIS — I4819 Other persistent atrial fibrillation: Secondary | ICD-10-CM

## 2016-03-12 DIAGNOSIS — I480 Paroxysmal atrial fibrillation: Secondary | ICD-10-CM | POA: Diagnosis not present

## 2016-03-12 LAB — POCT INR: INR: 1.5

## 2016-03-15 ENCOUNTER — Other Ambulatory Visit: Payer: Self-pay | Admitting: Internal Medicine

## 2016-03-15 NOTE — Telephone Encounter (Signed)
Rx(s) sent to pharmacy electronically.  

## 2016-03-24 ENCOUNTER — Other Ambulatory Visit: Payer: Self-pay | Admitting: Physician Assistant

## 2016-03-25 ENCOUNTER — Ambulatory Visit (INDEPENDENT_AMBULATORY_CARE_PROVIDER_SITE_OTHER): Payer: Medicare Other | Admitting: Pharmacist Clinician (PhC)/ Clinical Pharmacy Specialist

## 2016-03-25 DIAGNOSIS — I4819 Other persistent atrial fibrillation: Secondary | ICD-10-CM

## 2016-03-25 DIAGNOSIS — I481 Persistent atrial fibrillation: Secondary | ICD-10-CM | POA: Diagnosis not present

## 2016-03-25 DIAGNOSIS — I480 Paroxysmal atrial fibrillation: Secondary | ICD-10-CM | POA: Diagnosis not present

## 2016-03-25 DIAGNOSIS — Z7901 Long term (current) use of anticoagulants: Secondary | ICD-10-CM

## 2016-03-25 LAB — POCT INR: INR: 1.8

## 2016-03-25 NOTE — Telephone Encounter (Signed)
Rx(s) sent to pharmacy electronically.  

## 2016-04-05 ENCOUNTER — Ambulatory Visit (INDEPENDENT_AMBULATORY_CARE_PROVIDER_SITE_OTHER): Payer: Medicare Other | Admitting: Pharmacist

## 2016-04-05 DIAGNOSIS — I481 Persistent atrial fibrillation: Secondary | ICD-10-CM

## 2016-04-05 DIAGNOSIS — Z7901 Long term (current) use of anticoagulants: Secondary | ICD-10-CM | POA: Diagnosis not present

## 2016-04-05 DIAGNOSIS — I480 Paroxysmal atrial fibrillation: Secondary | ICD-10-CM | POA: Diagnosis not present

## 2016-04-05 DIAGNOSIS — I4819 Other persistent atrial fibrillation: Secondary | ICD-10-CM

## 2016-04-05 LAB — POCT INR: INR: 4.3

## 2016-04-17 ENCOUNTER — Ambulatory Visit (INDEPENDENT_AMBULATORY_CARE_PROVIDER_SITE_OTHER): Payer: Medicare Other | Admitting: Pharmacist

## 2016-04-17 ENCOUNTER — Encounter: Payer: Self-pay | Admitting: Internal Medicine

## 2016-04-17 ENCOUNTER — Ambulatory Visit (INDEPENDENT_AMBULATORY_CARE_PROVIDER_SITE_OTHER): Payer: Medicare Other | Admitting: Internal Medicine

## 2016-04-17 VITALS — BP 110/34 | HR 56 | Ht 62.0 in | Wt 137.0 lb

## 2016-04-17 DIAGNOSIS — I481 Persistent atrial fibrillation: Secondary | ICD-10-CM

## 2016-04-17 DIAGNOSIS — N184 Chronic kidney disease, stage 4 (severe): Secondary | ICD-10-CM | POA: Diagnosis not present

## 2016-04-17 DIAGNOSIS — I480 Paroxysmal atrial fibrillation: Secondary | ICD-10-CM

## 2016-04-17 DIAGNOSIS — I10 Essential (primary) hypertension: Secondary | ICD-10-CM

## 2016-04-17 DIAGNOSIS — I5030 Unspecified diastolic (congestive) heart failure: Secondary | ICD-10-CM | POA: Diagnosis not present

## 2016-04-17 DIAGNOSIS — L309 Dermatitis, unspecified: Secondary | ICD-10-CM

## 2016-04-17 DIAGNOSIS — I4819 Other persistent atrial fibrillation: Secondary | ICD-10-CM

## 2016-04-17 DIAGNOSIS — Z7901 Long term (current) use of anticoagulants: Secondary | ICD-10-CM

## 2016-04-17 LAB — POCT INR: INR: 2.9

## 2016-04-17 MED ORDER — DESONIDE 0.05 % EX OINT: TOPICAL_OINTMENT | CUTANEOUS | 0 refills | Status: DC

## 2016-04-17 MED ORDER — METOPROLOL TARTRATE 25 MG PO TABS: 12.5000 mg | ORAL_TABLET | Freq: Two times a day (BID) | ORAL | 1 refills | Status: DC

## 2016-04-17 NOTE — Progress Notes (Signed)
OFFICE NOTE  Chief Complaint:  Follow-up  Primary Care Physician: Alexis Daniel, PA-C  HPI:  Alexis [name] is a pleasant 82 year old female who was recently seen at any and for heart failure exacerbation. She presented at this couple of weeks of increasing shortness of breath, cough, increasing abdominal girth and lower extremity edema. On admission her BNP was elevated and she responded fairly quickly to diuresis. This caused improvement in her breathing. She was noted to be hypertensive and continues to be hypertensive today. She has had some acute on chronic renal insufficiency.  She reports that when she was discharged she was sent home on oxygen, but was never told why she needed it. She followed up with her primary care provider, who is with [facility] and he recommended that she see a cardiologist to help determine whether she needs to continue to take oxygen. She apparently sought out our group and on seeing her today.  Her past medical history is significant for hypertension, diabetes, dyslipidemia and family history of heart disease.  She reports that since discharge her breathing is better, however when she does marked exertion she does feel short of breath. She is not using her oxygen. She denies any chest pain.  An echocardiogram performed at [facility] showed an EF of 65-70%, with moderate concentric LVH, and no wall motion abnormalities.  She's never had a stress test evaluation.  Alexis [name] returns today for followup of her stress test. This is a nuclear stress test that showed no reversible ischemia and a preserved EF greater than 70%. She reports that she's been undergoing rehabilitation at home and feels that her shortness of breath is improving. It is now a point where she does not feel that she needs oxygen. We went ahead and ambulated her around the office today and her baseline SpO2 was 98% and decreased to 95% with exertion. This is markedly improved from her  previous office visit where her oxygen saturation dropped to 74% on room air.  Also at her last office visit I started her on amlodipine 10 mg daily and this helped both lower blood pressure and I suspect will ask her pulmonary artery pressures as well.  I saw Alexis [name] back today from her recent hospitalization. She was also due for annual follow-up. She was recently admitted for what sounds like a diastolic heart failure exacerbation, possibly precipitated by pneumonia and there was clear medication noncompliance. She was not taking her Lasix as prescribed. Also there may been some dietary noncompliance. She had about 6-8 pounds of weight gain which was diuresed down to 151 pounds on discharge. She is currently weighing 156 pounds at home and waited at 158 pounds in the office. She reports no significant shortness of breath and has been "religious" about taking her Lasix on a daily basis. She says she is eating more than she had before she is currently living with her daughter.  [name] was again hospitalized for diastolic heart failure/COPD. Again there is suspicion of possible noncompliance. She diuresed and was given antibiotics/steroids. Breathing is somewhat improved. She is currently on 3 L continuous oxygen. She has not been back to see her pulmonologist yet. She was placed on Lasix 60 mg daily after a pulse dose of Lasix 120 mg for 3 days. She is currently complaining of leg swelling and some shortness of breath with mild exertion.  I saw [name] again recently in the hospital this time in the setting of acute  GI bleeding. She was found to be anemic however a source of her bleeding was not identified. She did undergo EGD. She was also found to have new onset atrial fibrillation. Her CHADSVASC score is 6, therefore ultimately anticoagulation must be considered although at the time with her active bleeding it was not pursued. We talked at length today in the office about anticoagulation  and her stroke risk and she is deathly afraid of stroke, which is very reasonable given her risk. Given the possible options for anticoagulation, the safest option may be warfarin which could be easily reversed if recurrent bleeding is noted. She currently reports dark stools but is on twice daily iron.  Alexis [name] returns today for follow-up. [facility] was [facility] and treated by the hospital medicine service for acute congestive heart failure/hypoxia related to COPD. A repeat echo in January 2017 showed normal LV systolic function with diastolic dysfunction and high LV filling pressure. She was diuresed and blood pressure was treated as she was noted to be markedly hypertensive on admission. At discharge her weight was 62 kg and she was transitioned to torsemide 40 mg every morning and 20 mg every afternoon. She does have stage IV chronic kidney disease which is been stable. She was taken off of aspirin but remains on warfarin for anticoagulation for her A. fib and CHADSVASC score of 6. She reports her weight has been stable. She denies any worsening shortness of breath. With a long discussion today about salt avoidance and it seems that there may be some dietary indiscretion as a cause of some of her recurrent heart failure exacerbations.  10/13/2015  Alexis [name] returns today for follow-up from her recent hospitalization. I saw her while she was hospitalized and help treat her for diastolic heart failure. This was thought related to atrial fibrillation which she was in more persistently. After we diuresed her, I cardioverted her and she has maintained a sinus bradycardia since then. She had some additional diuresis on follow-up with Theodore Demarkhonda Barrett, PA-C. Her weight is now down 3 additional pounds to a dry weight of 128 pounds. She said this is the best she's felt in several months. She is also made efforts to reduce sodium in her diet and is watching her weight very  carefully.  02/14/2016  Alexis [name] returns today for follow-up. She seems to be fairly stable. Her weight is now up though to 135 from 128. She says she's had no worsening swelling or shortness of breath. She says that her appetite is been really good except when you talk about the types of food she is eating it seems that her oral intake is not that high. I'm concerned that she is reaccumulating fluid. Blood pressure today was markedly elevated at 196/58. She thought that she would not take her blood pressure medicine because I was going to do a physical on her today. I don't understand why she thought that she should not take her blood pressure medicine. Unfortunate she does not have a blood pressure cuff at home and says she cannot afford one.  04/17/2016  Alexis [name] was seen in the office today. She was recently admitted for pneumonia and UTI with acute confusion in October. She subsequently recovered from this. INR was rechecked today and adjustments were made by pharmacy. She continues to have a low diastolic blood pressure of 34. Weight is been within a couple pounds of her baseline. She notes no worsening swelling. She does have a papular and somewhat  pruritic rash of the bilateral lower extremities from mid shin down to the ankle with some associated edema. This is somewhat excoriated. She's been applying Gold Bond to it.  PMHx:  Past Medical History:  Diagnosis Date  . Anxiety   . Chronic diastolic CHF (congestive heart failure) (HCC)    a. 05/2015 Echo: EF 55-60%, mild LVH, no rwma, mild MR, mildly dil RA, mod TR, PASP .  . CKD (chronic kidney disease), stage IV (HCC)   . Confusion state   . COPD (chronic obstructive pulmonary disease) (HCC)    a. on home O2 @ 3lpm.  . Elevated troponin    a. H/o elevated trop in setting of CHF; b. 06/2013 Lexiscan MV: nl EF, mild anterior breast attenuation, no ischemia.   . Family history of adverse reaction to anesthesia   . GIB  (gastrointestinal bleeding)    a. History of GIB.  Marland Kitchen Hypertensive heart disease   . Macular degeneration, bilateral   . Neuropathy (HCC)   . Paroxysmal atrial fibrillation (HCC)    a. CHA2DS2VASc = 6-->coumadin.  Marland Kitchen PONV (postoperative nausea and vomiting)   . Type II diabetes mellitus (HCC)   . Varicose veins     Past Surgical History:  Procedure Laterality Date  . ABDOMINAL HYSTERECTOMY    . CARDIOVERSION N/A 09/22/2015   Procedure: CARDIOVERSION;  Surgeon: Chrystie Nose, MD;  Location: [facility] ENDOSCOPY;  Service: Cardiovascular;  Laterality: N/A;  . ESOPHAGOGASTRODUODENOSCOPY N/A 12/01/2014   Procedure: ESOPHAGOGASTRODUODENOSCOPY (EGD);  Surgeon: Hilarie Fredrickson, MD;  Location: [facility] ENDOSCOPY;  Service: Endoscopy;  Laterality: N/A;  . HIP FRACTURE SURGERY  2004  . VEIN SURGERY      FAMHx:  Family History  Problem Relation Age of Onset  . Colon cancer Father 69    advanced when discovered, no surgery or therapy.  this led to his death  . Breast cancer Mother   . Hypertension Mother   . Breast cancer Sister   . Cancer Brother   . Lung cancer Brother   . Thyroid disease Neg Hx   . Heart attack Neg Hx     SOCHx:   reports that she is a non-smoker but has been exposed to tobacco smoke. She has never used smokeless tobacco. She reports that she does not drink alcohol or use drugs.  ALLERGIES:  Allergies  Allergen Reactions  . Yellow Dyes (Non-Tartrazine) Nausea And Vomiting    "deathly allergic" No other information given to explain reaction. Per Daughter- this was an IV dye. Ok with yellow coloring on Coumadin tablets.   . Codeine Hives  . Other Other (See Comments)    novocaine broke mouth out  . Penicillins Hives  . Procaine Hives    ROS: Pertinent items noted in HPI and remainder of comprehensive ROS otherwise negative.  HOME MEDS: Current Outpatient Prescriptions  Medication Sig Dispense Refill  . albuterol (PROVENTIL HFA;VENTOLIN HFA) 108 (90 BASE) MCG/ACT inhaler  Inhale 2 puffs into the lungs every 6 (six) hours as needed for wheezing or shortness of breath. 1 Inhaler 3  . atorvastatin (LIPITOR) 20 MG tablet Take 20 mg by mouth daily.    . cholecalciferol (VITAMIN D) 1000 UNITS tablet Take 1,000 Units by mouth every morning.     . docusate sodium (COLACE) 100 MG capsule Take 100 mg by mouth 2 (two) times daily as needed for mild constipation.    Marland Kitchen escitalopram (LEXAPRO) 10 MG tablet Take 10 mg by mouth daily.    Marland Kitchen  fenofibrate 160 MG tablet Take 160 mg by mouth daily.    Marland Kitchen gabapentin (NEURONTIN) 100 MG capsule Take 1 capsule (100 mg total) by mouth at bedtime. 30 capsule 0  . iron polysaccharides (NIFEREX) 150 MG capsule Take 1 capsule (150 mg total) by mouth daily. 30 capsule 3  . levofloxacin (LEVAQUIN) 250 MG tablet Take 1 tablet (250 mg total) by mouth every other day. 2 tablet 0  . metoprolol tartrate (LOPRESSOR) 25 MG tablet Take 0.5 tablets (12.5 mg total) by mouth 2 (two) times daily. 60 tablet 1  . Multiple Vitamin (MULTIVITAMIN WITH MINERALS) TABS tablet Take 1 tablet by mouth daily.    . OXYGEN Inhale 3 L into the lungs at bedtime.     . polyethylene glycol (MIRALAX / GLYCOLAX) packet Take 17 g by mouth daily as needed for mild constipation.    . potassium chloride SA (K-DUR,KLOR-CON) 20 MEQ tablet Take 20 mEq by mouth daily.    . potassium chloride SA (KLOR-CON M20) 20 MEQ tablet Take 1 tablet (20 mEq total) by mouth daily. 30 tablet 11  . torsemide (DEMADEX) 20 MG tablet TAKE 2 TABLETS BY MOUTH EVERY DAY 60 tablet 2  . desonide (DESOWEN) 0.05 % ointment Apply to affected area, use for 1 week 15 g 0   No current facility-administered medications for this visit.     LABS/IMAGING: No results found for this or any previous visit (from the past 48 hour(s)). No results found.  VITALS: BP (!) 110/34   Pulse (!) 56   Ht 5\' 2"  (1.575 m)   Wt 137 lb (62.1 kg)   BMI 25.06 kg/m   EXAM: General appearance: alert and no distress Neck: no  carotid bruit and no JVD Lungs: diminished breath sounds bibasilar Heart: regular rate and rhythm, S1, S2 normal, no murmur, click, rub or gallop Abdomen: soft, non-tender; bowel sounds normal; no masses,  no organomegaly Extremities: Bilateral ankle edema with a papular, pruritic rash Pulses: 2+ and symmetric Skin: Skin color, texture, turgor normal. No rashes or lesions Neurologic: Grossly normal Psych: Mood, affect normal  EKG: Deferred  ASSESSMENT: 1. Dermatitis 2. Recent acute diastolic heart failure 3. Persistent atrial fibrillation-CHADSVASC score of 6 on warfarin, s/p cardioversion 4. Anemia  5. Hypertensive heart disease with moderate LVH 6. Hypertension - controlled 7. Dyslipidemia 8. Diabetes type 2 9. Family history of heart disease 10. COPD on 3 L O2 continuous  PLAN: 1.   Alexis [name] seems to be stable with regards to weight. We'll continue her current dose of diuretics. She does have a pruritic, papular rash on both ankles which is somewhat of a dermatitis. I've encouraged moisturization. I would advise against Gold Bond and recommended either Aquaphor or Cetaphil. I'll also prescribe low to moderate potency desonide to use for up to a week. Given her low diastolic blood pressure, I advised her to decrease her metoprolol to 12.5 mg twice daily. Heart rate also is in the 50s.  Follow-up in 6 months.  Chrystie Nose, MD, [facility] Attending Cardiologist [facility]  Lisette Abu [facility] 04/17/2016, 11:06 AM

## 2016-04-17 NOTE — Patient Instructions (Addendum)
Medication Instructions:  DECREASE Metoprolol to 12.5mg  Take 1 tab twice a day (break 25mg  tablet in half and take half twice a day)  Labwork: None   Testing/Procedures: None   Follow-Up: Your physician wants you to follow-up in: 6 MONTHS with DR HILTY. You will receive a reminder letter in the mail two months in advance. If you don't receive a letter, please call our office to schedule the follow-up appointment.  Any Other Special Instructions Will Be Listed Below (If Applicable).  Dr [name] Recommends Aquaphor or Acetaminophen    If you need a refill on your cardiac medications before your next appointment, please call your pharmacy.

## 2016-04-24 ENCOUNTER — Other Ambulatory Visit: Payer: Self-pay | Admitting: Internal Medicine

## 2016-04-25 ENCOUNTER — Other Ambulatory Visit: Payer: Self-pay

## 2016-04-25 ENCOUNTER — Other Ambulatory Visit: Payer: Self-pay | Admitting: Pharmacist Clinician (PhC)/ Clinical Pharmacy Specialist

## 2016-04-25 MED ORDER — WARFARIN SODIUM 4 MG PO TABS: 4.0000 mg | ORAL_TABLET | Freq: Every day | ORAL | 1 refills | Status: DC

## 2016-04-25 MED ORDER — TORSEMIDE 20 MG PO TABS: 40.0000 mg | ORAL_TABLET | Freq: Every day | ORAL | 10 refills | Status: DC

## 2016-04-26 ENCOUNTER — Other Ambulatory Visit: Payer: Self-pay

## 2016-04-26 MED ORDER — TORSEMIDE 20 MG PO TABS: 40.0000 mg | ORAL_TABLET | Freq: Every day | ORAL | 3 refills | Status: DC

## 2016-04-26 MED ORDER — WARFARIN SODIUM 4 MG PO TABS: 4.0000 mg | ORAL_TABLET | Freq: Every day | ORAL | 1 refills | Status: DC

## 2016-04-27 ENCOUNTER — Other Ambulatory Visit: Payer: Self-pay | Admitting: Internal Medicine

## 2016-05-08 ENCOUNTER — Ambulatory Visit (INDEPENDENT_AMBULATORY_CARE_PROVIDER_SITE_OTHER): Payer: Medicare Other | Admitting: Pharmacist

## 2016-05-08 DIAGNOSIS — Z7901 Long term (current) use of anticoagulants: Secondary | ICD-10-CM | POA: Diagnosis not present

## 2016-05-08 DIAGNOSIS — I480 Paroxysmal atrial fibrillation: Secondary | ICD-10-CM | POA: Diagnosis not present

## 2016-05-08 DIAGNOSIS — I4819 Other persistent atrial fibrillation: Secondary | ICD-10-CM

## 2016-05-08 DIAGNOSIS — I481 Persistent atrial fibrillation: Secondary | ICD-10-CM | POA: Diagnosis not present

## 2016-05-08 LAB — POCT INR: INR: 2.8

## 2016-05-10 ENCOUNTER — Other Ambulatory Visit: Payer: Self-pay

## 2016-05-21 ENCOUNTER — Other Ambulatory Visit: Payer: Self-pay

## 2016-05-21 MED ORDER — TORSEMIDE 20 MG PO TABS: 40.0000 mg | ORAL_TABLET | Freq: Every day | ORAL | 3 refills | Status: DC

## 2016-05-21 MED ORDER — WARFARIN SODIUM 4 MG PO TABS: 4.0000 mg | ORAL_TABLET | Freq: Every day | ORAL | 1 refills | Status: DC

## 2016-05-21 MED ORDER — METOPROLOL TARTRATE 25 MG PO TABS: 12.5000 mg | ORAL_TABLET | Freq: Two times a day (BID) | ORAL | 1 refills | Status: DC

## 2016-05-21 MED ORDER — POTASSIUM CHLORIDE CRYS ER 20 MEQ PO TBCR: 20.0000 meq | EXTENDED_RELEASE_TABLET | Freq: Every day | ORAL | 11 refills | Status: DC

## 2016-05-22 ENCOUNTER — Other Ambulatory Visit: Payer: Self-pay

## 2016-05-22 MED ORDER — METOPROLOL TARTRATE 25 MG PO TABS: 12.5000 mg | ORAL_TABLET | Freq: Two times a day (BID) | ORAL | 5 refills | Status: DC

## 2016-05-22 MED ORDER — POTASSIUM CHLORIDE CRYS ER 20 MEQ PO TBCR: 20.0000 meq | EXTENDED_RELEASE_TABLET | Freq: Every day | ORAL | 5 refills | Status: DC

## 2016-06-05 ENCOUNTER — Ambulatory Visit (INDEPENDENT_AMBULATORY_CARE_PROVIDER_SITE_OTHER): Payer: Medicare Other | Admitting: Pharmacist

## 2016-06-05 DIAGNOSIS — Z7901 Long term (current) use of anticoagulants: Secondary | ICD-10-CM

## 2016-06-05 DIAGNOSIS — I481 Persistent atrial fibrillation: Secondary | ICD-10-CM | POA: Diagnosis not present

## 2016-06-05 DIAGNOSIS — I480 Paroxysmal atrial fibrillation: Secondary | ICD-10-CM | POA: Diagnosis not present

## 2016-06-05 DIAGNOSIS — I4819 Other persistent atrial fibrillation: Secondary | ICD-10-CM

## 2016-06-05 LAB — POCT INR: INR: 5

## 2016-06-19 ENCOUNTER — Ambulatory Visit (INDEPENDENT_AMBULATORY_CARE_PROVIDER_SITE_OTHER): Payer: Medicare Other | Admitting: Pharmacist

## 2016-06-19 ENCOUNTER — Telehealth: Payer: Self-pay | Admitting: Pharmacist

## 2016-06-19 DIAGNOSIS — I480 Paroxysmal atrial fibrillation: Secondary | ICD-10-CM | POA: Diagnosis not present

## 2016-06-19 DIAGNOSIS — I481 Persistent atrial fibrillation: Secondary | ICD-10-CM

## 2016-06-19 DIAGNOSIS — Z7901 Long term (current) use of anticoagulants: Secondary | ICD-10-CM

## 2016-06-19 DIAGNOSIS — I4819 Other persistent atrial fibrillation: Secondary | ICD-10-CM

## 2016-06-19 LAB — POCT INR: INR: 1.8

## 2016-06-19 NOTE — Telephone Encounter (Signed)
Patient requesting advice from cardiologist  BP 170/60 during f/u with PCP.  Noted BP was 110/34 (HR 56) at most recent cardiologist visit. Metoprolol dose was decreased from 25mg  BID to 12.5mg  BID due to HR in the 50's.  No medication changes done by PCP, patient awaiting recommendation from cardiology.  Okay to call home number and talk to son.

## 2016-06-19 NOTE — Telephone Encounter (Signed)
She has had very low diastolic BPs in the past - was 30 when I saw her. I would monitor BP - if it remains >150 systolic and diastolic is >50, could consider adding low dose CCB - amlodipine 2.5 mg daily. Her last creatinine was over 2 (GFR <30) - so no ACEI or ARB.  Thanks.  Dr. Rexene Edison

## 2016-06-20 NOTE — Telephone Encounter (Signed)
LMOM.  Recommendation from DR Hilty to monitor BP for now given history of low diastolic Bp.   Instructed to call clinic if BP remains > 150 so we can add amlodipine to the therapy.

## 2016-06-24 ENCOUNTER — Other Ambulatory Visit: Payer: Self-pay | Admitting: Internal Medicine

## 2016-07-26 ENCOUNTER — Ambulatory Visit (INDEPENDENT_AMBULATORY_CARE_PROVIDER_SITE_OTHER): Payer: Medicare Other | Admitting: Pharmacist Clinician (PhC)/ Clinical Pharmacy Specialist

## 2016-07-26 DIAGNOSIS — Z7901 Long term (current) use of anticoagulants: Secondary | ICD-10-CM | POA: Diagnosis not present

## 2016-07-26 DIAGNOSIS — I480 Paroxysmal atrial fibrillation: Secondary | ICD-10-CM

## 2016-07-26 DIAGNOSIS — I481 Persistent atrial fibrillation: Secondary | ICD-10-CM | POA: Diagnosis not present

## 2016-07-26 DIAGNOSIS — I4819 Other persistent atrial fibrillation: Secondary | ICD-10-CM

## 2016-07-26 LAB — POCT INR: INR: 2.2

## 2016-07-29 ENCOUNTER — Other Ambulatory Visit: Payer: Self-pay | Admitting: Internal Medicine

## 2016-08-13 ENCOUNTER — Encounter (HOSPITAL_COMMUNITY): Payer: Self-pay | Admitting: Emergency Medicine

## 2016-08-13 DIAGNOSIS — N184 Chronic kidney disease, stage 4 (severe): Secondary | ICD-10-CM | POA: Insufficient documentation

## 2016-08-13 DIAGNOSIS — R112 Nausea with vomiting, unspecified: Secondary | ICD-10-CM | POA: Insufficient documentation

## 2016-08-13 DIAGNOSIS — I130 Hypertensive heart and chronic kidney disease with heart failure and stage 1 through stage 4 chronic kidney disease, or unspecified chronic kidney disease: Secondary | ICD-10-CM | POA: Insufficient documentation

## 2016-08-13 DIAGNOSIS — E1140 Type 2 diabetes mellitus with diabetic neuropathy, unspecified: Secondary | ICD-10-CM | POA: Insufficient documentation

## 2016-08-13 DIAGNOSIS — R197 Diarrhea, unspecified: Secondary | ICD-10-CM | POA: Insufficient documentation

## 2016-08-13 DIAGNOSIS — J449 Chronic obstructive pulmonary disease, unspecified: Secondary | ICD-10-CM | POA: Insufficient documentation

## 2016-08-13 DIAGNOSIS — Z7722 Contact with and (suspected) exposure to environmental tobacco smoke (acute) (chronic): Secondary | ICD-10-CM | POA: Diagnosis not present

## 2016-08-13 DIAGNOSIS — Z7901 Long term (current) use of anticoagulants: Secondary | ICD-10-CM | POA: Insufficient documentation

## 2016-08-13 DIAGNOSIS — E1122 Type 2 diabetes mellitus with diabetic chronic kidney disease: Secondary | ICD-10-CM | POA: Insufficient documentation

## 2016-08-13 DIAGNOSIS — I5032 Chronic diastolic (congestive) heart failure: Secondary | ICD-10-CM | POA: Diagnosis not present

## 2016-08-13 LAB — COMPREHENSIVE METABOLIC PANEL
ALT: 17 U/L (ref 14–54)
ANION GAP: 13 (ref 5–15)
AST: 32 U/L (ref 15–41)
Albumin: 3.5 g/dL (ref 3.5–5.0)
Alkaline Phosphatase: 117 U/L (ref 38–126)
BUN: 35 mg/dL — ABNORMAL HIGH (ref 6–20)
CALCIUM: 9.3 mg/dL (ref 8.9–10.3)
CHLORIDE: 103 mmol/L (ref 101–111)
CO2: 24 mmol/L (ref 22–32)
CREATININE: 1.67 mg/dL — AB (ref 0.44–1.00)
GFR, EST AFRICAN AMERICAN: 32 mL/min — AB (ref >60)
GFR, EST NON AFRICAN AMERICAN: 27 mL/min — AB (ref >60)
Glucose, Bld: 183 mg/dL — ABNORMAL HIGH (ref 65–99)
Potassium: 4.1 mmol/L (ref 3.5–5.1)
SODIUM: 140 mmol/L (ref 135–145)
Total Bilirubin: 0.9 mg/dL (ref 0.3–1.2)
Total Protein: 7.3 g/dL (ref 6.5–8.1)

## 2016-08-13 LAB — CBC
HCT: 40.6 % (ref 36.0–46.0)
Hemoglobin: 13 g/dL (ref 12.0–15.0)
MCH: 26.4 pg (ref 26.0–34.0)
MCHC: 32 g/dL (ref 30.0–36.0)
MCV: 82.4 fL (ref 78.0–100.0)
PLATELETS: 193 10*3/uL (ref 150–400)
RBC: 4.93 MIL/uL (ref 3.87–5.11)
RDW: 16.6 % — ABNORMAL HIGH (ref 11.5–15.5)
WBC: 7.7 10*3/uL (ref 4.0–10.5)

## 2016-08-13 NOTE — ED Triage Notes (Signed)
Pt. reports multiple emesis with diarrhea , thrist , generalized weakness/fatigue onset yesterday morning , denies fever or chills .

## 2016-08-14 ENCOUNTER — Emergency Department (HOSPITAL_COMMUNITY)
Admission: EM | Admit: 2016-08-14 | Discharge: 2016-08-14 | Disposition: A | Discharge status: Home / Self Care | Payer: Medicare Other | Attending: Emergency Medicine | Admitting: Emergency Medicine

## 2016-08-14 DIAGNOSIS — R197 Diarrhea, unspecified: Secondary | ICD-10-CM

## 2016-08-14 DIAGNOSIS — R112 Nausea with vomiting, unspecified: Secondary | ICD-10-CM

## 2016-08-14 MED ORDER — ONDANSETRON HCL 4 MG/2ML IJ SOLN
4.0000 mg | Freq: Once | INTRAMUSCULAR | Status: AC
  Administered 2016-08-14: 4 mg via INTRAVENOUS
  Filled 2016-08-14: qty 2

## 2016-08-14 MED ORDER — SODIUM CHLORIDE 0.9 % IV BOLUS (SEPSIS)
500.0000 mL | Freq: Once | INTRAVENOUS | Status: AC
  Administered 2016-08-14: 500 mL via INTRAVENOUS

## 2016-08-14 MED ORDER — ONDANSETRON HCL 4 MG PO TABS: 4.0000 mg | ORAL_TABLET | Freq: Four times a day (QID) | ORAL | 0 refills | Status: DC

## 2016-08-14 NOTE — ED Notes (Signed)
Pt has tolerated several packs of crackers as well as one can of sprite and some water

## 2016-08-14 NOTE — ED Notes (Signed)
Pt tolerated 2 packs of crackers and has sipped on sprite and tolerated this so far

## 2016-08-14 NOTE — ED Provider Notes (Signed)
[facility]-EMERGENCY DEPT Provider Note   CSN: [id number] Arrival date & time: 08/13/16  4098   By signing my name below, I, Alexis [name], attest that this documentation has been prepared under the direction and in the presence of Gilda Crease, MD. Electronically Signed: Soijett [name], ED Scribe. 08/14/16. 1:38 AM.  History   Chief Complaint Chief Complaint  Patient presents with  . Emesis  . Diarrhea    HPI Alexis [name] is a 83 y.o. female with a PMHx of DM, who presents to the Emergency Department complaining of vomiting onset yesterday morning. Pt reports associated diarrhea and appetite change. Pt has not tried any medications for the relief of her symptoms. She denies abdominal pain, fever, and any other symptoms.   The history is provided by the patient and a relative. No language interpreter was used.    Past Medical History:  Diagnosis Date  . Anxiety   . Chronic diastolic CHF (congestive heart failure) (HCC)    a. 05/2015 Echo: EF 55-60%, mild LVH, no rwma, mild MR, mildly dil RA, mod TR, PASP .  . CKD (chronic kidney disease), stage IV (HCC)   . Confusion state   . COPD (chronic obstructive pulmonary disease) (HCC)    a. on home O2 @ 3lpm.  . Elevated troponin    a. H/o elevated trop in setting of CHF; b. 06/2013 Lexiscan MV: nl EF, mild anterior breast attenuation, no ischemia.   . Family history of adverse reaction to anesthesia   . GIB (gastrointestinal bleeding)    a. History of GIB.  Marland Kitchen Hypertensive heart disease   . Macular degeneration, bilateral   . Neuropathy (HCC)   . Paroxysmal atrial fibrillation (HCC)    a. CHA2DS2VASc = 6-->coumadin.  Marland Kitchen PONV (postoperative nausea and vomiting)   . Type II diabetes mellitus (HCC)   . Varicose veins     Patient Active Problem List   Diagnosis Date Noted  . Dermatitis 04/17/2016  . CKD (chronic kidney disease), stage IV (HCC) 03/04/2016  . Supratherapeutic INR 03/04/2016  . CAP (community acquired  pneumonia) 02/28/2016  . Sepsis (HCC) 02/28/2016  . Depression with anxiety 02/28/2016  . HLD (hyperlipidemia) 02/28/2016  . Pressure injury of skin 02/28/2016  . Weakness   . COPD exacerbation (HCC)   . Acute on chronic diastolic heart failure (HCC) 10/21/2015  . Type II diabetes mellitus (HCC)   . Hypertensive heart disease   . Atrial fibrillation, persistent (HCC)   . Absolute anemia   . Diabetes mellitus with complication (HCC)   . Simple chronic bronchitis (HCC)   . Shortness of breath 09/18/2015  . CHF exacerbation (HCC) 09/18/2015  . Anemia 09/18/2015  . Diabetes (HCC) 09/18/2015  . Hypertensive urgency   . CHF (congestive heart failure) (HCC) 08/02/2015  . Acute respiratory failure (HCC) 08/02/2015  . Acute diastolic CHF (congestive heart failure) (HCC) 08/02/2015  . Acute on chronic diastolic congestive heart failure, NYHA class 1 (HCC) 07/01/2015  . Chronic respiratory failure with hypoxia (HCC) 06/30/2015  . Hypoglycemia associated with type 2 diabetes mellitus (HCC) 06/30/2015  . Closed displaced fracture of left great toe 06/30/2015  . UTI (urinary tract infection) 06/30/2015  . Hypoglycemia associated with diabetes (HCC) 06/30/2015  . Hyperthyroidism 01/23/2015  . Long-term (current) use of anticoagulants 12/12/2014  . Paroxysmal atrial fibrillation (HCC) 12/01/2014  . Melena   . Pressure ulcer 11/30/2014  . Normocytic anemia 11/29/2014  . COPD (chronic obstructive pulmonary disease) (HCC) 07/12/2014  . (HFpEF) heart  failure with preserved ejection fraction (HCC) 07/16/2013  . Essential hypertension 06/10/2013  . Chronic diastolic heart failure, NYHA class 1 (HCC) 06/09/2013    Past Surgical History:  Procedure Laterality Date  . ABDOMINAL HYSTERECTOMY    . CARDIOVERSION N/A 09/22/2015   Procedure: CARDIOVERSION;  Surgeon: Chrystie Nose, MD;  Location[facility] ENDOSCOPY;  Service: Cardiovascular;  Laterality: N/A;  . ESOPHAGOGASTRODUODENOSCOPY N/A 12/01/2014    Procedure: ESOPHAGOGASTRODUODENOSCOPY (EGD);  Surgeon: Hilarie Fredrickson, MD;  Location: [facility] ENDOSCOPY;  Service: Endoscopy;  Laterality: N/A;  . HIP FRACTURE SURGERY  2004  . VEIN SURGERY      OB History    No data available       Home Medications    Prior to Admission medications   Medication Sig Start Date End Date Taking? Authorizing Provider  albuterol (PROVENTIL HFA;VENTOLIN HFA) 108 (90 BASE) MCG/ACT inhaler Inhale 2 puffs into the lungs every 6 (six) hours as needed for wheezing or shortness of breath. 06/11/13   Nishant Dhungel, MD  atorvastatin (LIPITOR) 20 MG tablet Take 20 mg by mouth daily.    Historical Provider, MD  cholecalciferol (VITAMIN D) 1000 UNITS tablet Take 1,000 Units by mouth every morning.     Historical Provider, MD  desonide (DESOWEN) 0.05 % ointment Apply to affected area, use for 1 week 04/17/16   Chrystie Nose, MD  docusate sodium (COLACE) 100 MG capsule Take 100 mg by mouth 2 (two) times daily as needed for mild constipation.    Historical Provider, MD  escitalopram (LEXAPRO) 10 MG tablet Take 10 mg by mouth daily.    Historical Provider, MD  fenofibrate 160 MG tablet Take 160 mg by mouth daily.    Historical Provider, MD  gabapentin (NEURONTIN) 100 MG capsule Take 1 capsule (100 mg total) by mouth at bedtime. 08/05/15   Rodolph Bong, MD  iron polysaccharides (NIFEREX) 150 MG capsule Take 1 capsule (150 mg total) by mouth daily. 09/22/15   Ripudeep Jenna Luo, MD  isosorbide mononitrate (IMDUR) 30 MG 24 hr tablet TAKE 1 TABLET BY MOUTH EVERY DAY 07/29/16   Chrystie Nose, MD  levofloxacin (LEVAQUIN) 250 MG tablet Take 1 tablet (250 mg total) by mouth every other day. 03/06/16   Calvert Cantor, MD  metoprolol tartrate (LOPRESSOR) 25 MG tablet Take 0.5 tablets (12.5 mg total) by mouth 2 (two) times daily. 05/22/16   Chrystie Nose, MD  Multiple Vitamin (MULTIVITAMIN WITH MINERALS) TABS tablet Take 1 tablet by mouth daily.    Historical Provider, MD  ondansetron  (ZOFRAN) 4 MG tablet Take 1 tablet (4 mg total) by mouth every 6 (six) hours. 08/14/16   Gilda Crease, MD  OXYGEN Inhale 3 L into the lungs at bedtime.     Historical Provider, MD  polyethylene glycol (MIRALAX / GLYCOLAX) packet Take 17 g by mouth daily as needed for mild constipation.    Historical Provider, MD  potassium chloride SA (KLOR-CON M20) 20 MEQ tablet Take 1 tablet (20 mEq total) by mouth daily. 05/22/16   Chrystie Nose, MD  torsemide (DEMADEX) 20 MG tablet Take 2 tablets (40 mg total) by mouth daily. 05/21/16   Chrystie Nose, MD  warfarin (COUMADIN) 4 MG tablet Take 1 tablet (4 mg total) by mouth daily. 05/21/16   Chrystie Nose, MD    Family History Family History  Problem Relation Age of Onset  . Colon cancer Father 59    advanced when discovered, no surgery or therapy.  this led to his death  . Breast cancer Mother   . Hypertension Mother   . Breast cancer Sister   . Cancer Brother   . Lung cancer Brother   . Thyroid disease Neg Hx   . Heart attack Neg Hx     Social History Social History  Substance Use Topics  . Smoking status: Passive Smoke Exposure - Never Smoker  . Smokeless tobacco: Never Used  . Alcohol use No     Allergies   Yellow dyes (non-tartrazine); Codeine; Other; Penicillins; and Procaine   Review of Systems Review of Systems  Constitutional: Positive for appetite change. Negative for fever.  Gastrointestinal: Positive for diarrhea and vomiting. Negative for abdominal pain.  All other systems reviewed and are negative.    Physical Exam Updated Vital Signs BP 137/65   Pulse 69   Temp 99 F (37.2 C) (Oral)   Resp 18   SpO2 93%   Physical Exam  Constitutional: She is oriented to person, place, and time. She appears well-developed and well-nourished. No distress.  HENT:  Head: Normocephalic and atraumatic.  Right Ear: Hearing normal.  Left Ear: Hearing normal.  Nose: Nose normal.  Mouth/Throat: Oropharynx is clear and  moist and mucous membranes are normal.  Eyes: Conjunctivae and EOM are normal. Pupils are equal, round, and reactive to light.  Neck: Normal range of motion. Neck supple.  Cardiovascular: Regular rhythm, S1 normal and S2 normal.  Exam reveals no gallop and no friction rub.   No murmur heard. Pulmonary/Chest: Effort normal and breath sounds normal. No respiratory distress. She exhibits no tenderness.  Abdominal: Soft. Normal appearance and bowel sounds are normal. There is no hepatosplenomegaly. There is no tenderness. There is no rebound, no guarding, no tenderness at McBurney's point and negative Murphy's sign. No hernia.  Musculoskeletal: Normal range of motion.  Neurological: She is alert and oriented to person, place, and time. She has normal strength. No cranial nerve deficit or sensory deficit. Coordination normal. GCS eye subscore is 4. GCS verbal subscore is 5. GCS motor subscore is 6.  Skin: Skin is warm, dry and intact. No rash noted. No cyanosis.  Psychiatric: She has a normal mood and affect. Her speech is normal and behavior is normal. Thought content normal.  Nursing note and vitals reviewed.    ED Treatments / Results  DIAGNOSTIC STUDIES: Oxygen Saturation is 93% on RA, nl by my interpretation.    COORDINATION OF CARE: 1:36 AM Discussed treatment plan with pt at bedside which includes labs and pt agreed to plan.   Labs (all labs ordered are listed, but only abnormal results are displayed) Labs Reviewed  COMPREHENSIVE METABOLIC PANEL - Abnormal; Notable for the following:       Result Value   Glucose, Bld 183 (*)    BUN 35 (*)    Creatinine, Ser 1.67 (*)    GFR calc non Af Amer 27 (*)    GFR calc Af Amer 32 (*)    All other components within normal limits  CBC - Abnormal; Notable for the following:    RDW 16.6 (*)    All other components within normal limits    Procedures Procedures (including critical care time)  Medications Ordered in ED Medications  sodium  chloride 0.9 % bolus 500 mL (0 mLs Intravenous Stopped 08/14/16 0314)  ondansetron (ZOFRAN) injection 4 mg (4 mg Intravenous Given 08/14/16 0245)     Initial Impression / Assessment and Plan / ED Course  I have  reviewed the triage vital signs and the nursing notes.  Pertinent labs that were available during my care of the patient were reviewed by me and considered in my medical decision making (see chart for details).     Patient presents to the emergency department for evaluation of nausea, vomiting, diarrhea. Symptoms present for 1 day. She has no complaints of abdominal pain. Patient has improved with treatment. She is now tolerating oral intake without difficulty. Lab work unremarkable. Abdominal exam benign.  Final Clinical Impressions(s) / ED Diagnoses   Final diagnoses:  Nausea vomiting and diarrhea    New Prescriptions Discharge Medication List as of 08/14/2016  4:54 AM    START taking these medications   Details  ondansetron (ZOFRAN) 4 MG tablet Take 1 tablet (4 mg total) by mouth every 6 (six) hours., Starting Wed 08/14/2016, Print       I personally performed the services described in this documentation, which was scribed in my presence. The recorded information has been reviewed and is accurate.     Gilda Crease, MD 08/21/16 [phone]

## 2016-08-19 ENCOUNTER — Ambulatory Visit (INDEPENDENT_AMBULATORY_CARE_PROVIDER_SITE_OTHER): Payer: Medicare Other | Admitting: Pharmacist

## 2016-08-19 DIAGNOSIS — Z7901 Long term (current) use of anticoagulants: Secondary | ICD-10-CM

## 2016-08-19 DIAGNOSIS — I481 Persistent atrial fibrillation: Secondary | ICD-10-CM | POA: Diagnosis not present

## 2016-08-19 DIAGNOSIS — I480 Paroxysmal atrial fibrillation: Secondary | ICD-10-CM | POA: Diagnosis not present

## 2016-08-19 DIAGNOSIS — I4819 Other persistent atrial fibrillation: Secondary | ICD-10-CM

## 2016-08-19 LAB — POCT INR: INR: 1.9

## 2016-09-09 ENCOUNTER — Other Ambulatory Visit: Payer: Self-pay | Admitting: Pharmacist

## 2016-09-09 MED ORDER — WARFARIN SODIUM 4 MG PO TABS: ORAL_TABLET | ORAL | 1 refills | Status: DC

## 2016-09-23 ENCOUNTER — Ambulatory Visit (INDEPENDENT_AMBULATORY_CARE_PROVIDER_SITE_OTHER): Payer: Medicare Other | Admitting: Pharmacist

## 2016-09-23 DIAGNOSIS — I480 Paroxysmal atrial fibrillation: Secondary | ICD-10-CM | POA: Diagnosis not present

## 2016-09-23 DIAGNOSIS — Z7901 Long term (current) use of anticoagulants: Secondary | ICD-10-CM

## 2016-09-23 DIAGNOSIS — I481 Persistent atrial fibrillation: Secondary | ICD-10-CM

## 2016-09-23 DIAGNOSIS — I4819 Other persistent atrial fibrillation: Secondary | ICD-10-CM

## 2016-09-23 LAB — POCT INR: INR: 2

## 2016-09-26 ENCOUNTER — Ambulatory Visit (INDEPENDENT_AMBULATORY_CARE_PROVIDER_SITE_OTHER): Payer: Medicare Other | Admitting: Podiatry

## 2016-09-26 ENCOUNTER — Encounter: Payer: Self-pay | Admitting: Podiatry

## 2016-09-26 VITALS — Resp 16 | Ht 62.0 in | Wt 142.0 lb

## 2016-09-26 DIAGNOSIS — M79605 Pain in left leg: Secondary | ICD-10-CM | POA: Diagnosis not present

## 2016-09-26 DIAGNOSIS — E1149 Type 2 diabetes mellitus with other diabetic neurological complication: Secondary | ICD-10-CM

## 2016-09-26 DIAGNOSIS — B351 Tinea unguium: Secondary | ICD-10-CM

## 2016-09-26 DIAGNOSIS — E1140 Type 2 diabetes mellitus with diabetic neuropathy, unspecified: Secondary | ICD-10-CM | POA: Diagnosis not present

## 2016-09-26 DIAGNOSIS — Q828 Other specified congenital malformations of skin: Secondary | ICD-10-CM | POA: Diagnosis not present

## 2016-09-26 DIAGNOSIS — M79604 Pain in right leg: Secondary | ICD-10-CM

## 2016-09-26 NOTE — Progress Notes (Signed)
   Subjective:    Patient ID: Alexis [name], female    DOB: 1932-01-28, 83 y.o.   MRN: [id number]  HPI Chief Complaint  Patient presents with  . Wound Check    Left foot; plantar forefoot-below great toe; x1 week  . Debridement    Bilateral nail trim; pt Diabetic Type 2; Sugar=134 this am; A1C=Does not know      Review of Systems  All other systems reviewed and are negative.      Objective:   Physical Exam        Assessment & Plan:

## 2016-09-27 NOTE — Progress Notes (Signed)
Subjective:    Patient ID: Alexis [name], female   DOB: 83 y.o.   MRN: [id number]   HPI patient presents on referral with possibility of ulceration right first metatarsal nail disease 1-5 both feet and long-term diabetes that has not been in good control. States this area of discoloration started one week ago    Review of Systems  All other systems reviewed and are negative.       Objective:  Physical Exam  Constitutional: She is oriented to person, place, and time.  Cardiovascular: Intact distal pulses.   Neurological: She is alert and oriented to person, place, and time.  Skin: Skin is warm and dry.  Nursing note and vitals reviewed.  I noted vascular status to be mildly diminished but intact with patient's neurological sensation diminished with diminished sharp Dole vibratory and DTR reflexes. Patient is prominent first metatarsal bilateral with plantar keratotic lesion first metatarsal right with no proximal edema erythema or no drainage occurring from the area. Patient has nail disease with thickness yellow brittle debris and pain 1-5 both feet with inability to cut or if her caregiver to cut     Assessment:    At risk diabetic with mycotic nail infection and playing and lesion plantar right first metatarsal with no active drainage and most likely more of a pressure point with porokeratotic component     Plan:    H&P and diabetic education rendered to patient. Today I went ahead and I debrided the lesion and did not note any ulceration indicating a pre-ulcerated of type porokeratotic lesion and I debrided nailbeds 1-5 both feet with no iatrogenic bleeding. Due to at risk condition with structural changes pre-ulcerative callus and neurological disease associated with long-term diabetes I did recommend long-term diabetic shoes with custom type molds to take pressure off the plantar foot and I'm getting approval and pedal orthotist will construct these for [place]

## 2016-09-29 NOTE — Progress Notes (Deleted)
Cardiology Office Note    Date:  09/29/2016   ID:  Alexis [name], DOB [birth year], MRN [id number]  PCP:  [name], PA-C  Cardiologist: Dr. [name]   No chief complaint on file.   History of Present Illness:    Alexis [name] is a 83 y.o. female with past medical history of chronic diastolic CHF (EF 40-98% by echo in 05/2015), HTN, HLD, Type 2 DM, and COPD (on 3L [place]) who presents to the office today for 23-month follow-up.   She was last examined by Dr. [name] in 03/2016 and was recovering from a recent admission for a UTI and PNA. She denied any recent episodes of chest pain or dyspnea on exertion. BP was soft at 110/34, therefore her Lopressor dosing was reduced from 25mg  BID to 12.5mg  BID.   Past Medical History:  Diagnosis Date  . Anxiety   . Chronic diastolic CHF (congestive heart failure) (HCC)    a. 05/2015 Echo: EF 55-60%, mild LVH, no rwma, mild MR, mildly dil RA, mod TR, PASP .  . CKD (chronic kidney disease), stage IV (HCC)   . Confusion state   . COPD (chronic obstructive pulmonary disease) (HCC)    a. on home O2 @ 3lpm.  . Elevated troponin    a. H/o elevated trop in setting of CHF; b. 06/2013 Lexiscan MV: nl EF, mild anterior breast attenuation, no ischemia.   . Family history of adverse reaction to anesthesia   . GIB (gastrointestinal bleeding)    a. History of GIB.  Marland Kitchen Hypertensive heart disease   . Macular degeneration, bilateral   . Neuropathy   . Paroxysmal atrial fibrillation (HCC)    a. CHA2DS2VASc = 6-->coumadin.  Marland Kitchen PONV (postoperative nausea and vomiting)   . Type II diabetes mellitus (HCC)   . Varicose veins     Past Surgical History:  Procedure Laterality Date  . ABDOMINAL HYSTERECTOMY    . CARDIOVERSION N/A 09/22/2015   Procedure: CARDIOVERSION;  Surgeon: Chrystie Nose, MD;  Location: [facility] ENDOSCOPY;  Service: Cardiovascular;  Laterality: N/A;  . ESOPHAGOGASTRODUODENOSCOPY N/A 12/01/2014   Procedure: ESOPHAGOGASTRODUODENOSCOPY  (EGD);  Surgeon: Hilarie Fredrickson, MD;  Location[facility] ENDOSCOPY;  Service: Endoscopy;  Laterality: N/A;  . HIP FRACTURE SURGERY  2004  . VEIN SURGERY      Current Medications: Outpatient Medications Prior to Visit  Medication Sig Dispense Refill  . albuterol (PROVENTIL HFA;VENTOLIN HFA) 108 (90 BASE) MCG/ACT inhaler Inhale 2 puffs into the lungs every 6 (six) hours as needed for wheezing or shortness of breath. 1 Inhaler 3  . atorvastatin (LIPITOR) 20 MG tablet Take 20 mg by mouth daily.    . cholecalciferol (VITAMIN D) 1000 UNITS tablet Take 1,000 Units by mouth every morning.     Marland Kitchen desonide (DESOWEN) 0.05 % ointment Apply to affected area, use for 1 week 15 g 0  . docusate sodium (COLACE) 100 MG capsule Take 100 mg by mouth 2 (two) times daily as needed for mild constipation.    Marland Kitchen escitalopram (LEXAPRO) 10 MG tablet Take 10 mg by mouth daily.    . fenofibrate 160 MG tablet Take 160 mg by mouth daily.    Marland Kitchen gabapentin (NEURONTIN) 100 MG capsule Take 1 capsule (100 mg total) by mouth at bedtime. 30 capsule 0  . iron polysaccharides (NIFEREX) 150 MG capsule Take 1 capsule (150 mg total) by mouth daily. 30 capsule 3  . isosorbide mononitrate (IMDUR) 30 MG 24 hr tablet TAKE 1 TABLET BY  MOUTH EVERY DAY 30 tablet 6  . levofloxacin (LEVAQUIN) 250 MG tablet Take 1 tablet (250 mg total) by mouth every other day. 2 tablet 0  . metoprolol tartrate (LOPRESSOR) 25 MG tablet Take 0.5 tablets (12.5 mg total) by mouth 2 (two) times daily. 60 tablet 5  . Multiple Vitamin (MULTIVITAMIN WITH MINERALS) TABS tablet Take 1 tablet by mouth daily.    . ondansetron (ZOFRAN) 4 MG tablet Take 1 tablet (4 mg total) by mouth every 6 (six) hours. 12 tablet 0  . OXYGEN Inhale 3 L into the lungs at bedtime.     . polyethylene glycol (MIRALAX / GLYCOLAX) packet Take 17 g by mouth daily as needed for mild constipation.    . potassium chloride SA (KLOR-CON M20) 20 MEQ tablet Take 1 tablet (20 mEq total) by mouth daily. 30 tablet 5    . torsemide (DEMADEX) 20 MG tablet Take 2 tablets (40 mg total) by mouth daily. 180 tablet 3  . warfarin (COUMADIN) 4 MG tablet Take 1 to 1.5 tablets daily as directed by coumadin clinic 120 tablet 1   No facility-administered medications prior to visit.      Allergies:   Yellow dyes (non-tartrazine); Codeine; Other; Penicillins; and Procaine   Social History   Social History  . Marital status: Widowed    Spouse name: N/A  . Number of children: Y  . Years of education: N/A   Occupational History  . retired    Social History Main Topics  . Smoking status: Passive Smoke Exposure - Never Smoker  . Smokeless tobacco: Never Used  . Alcohol use No  . Drug use: No  . Sexual activity: Not on file   Other Topics Concern  . Not on file   Social History Narrative   Originally from [place]. She has always lived in [place] & had no travel. She has significant smoke exposure through her husband for 60 years. She has no pets currently. No prior bird or mold exposure. 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 She lives in [place] with her son.     Family History:  The patient's ***family history includes Breast cancer in her mother and sister; Cancer in her brother; Colon cancer (age of onset: 86) in her father; Hypertension in her mother; Lung cancer in her brother.   Review of Systems:   Please see the history of present illness.     General:  No chills, fever, night sweats or weight changes.  Cardiovascular:  No chest pain, dyspnea on exertion, edema, orthopnea, palpitations, paroxysmal nocturnal dyspnea. Dermatological: No rash, lesions/masses Respiratory: No cough, dyspnea Urologic: No hematuria, dysuria Abdominal:   No nausea, vomiting, diarrhea, bright red blood per rectum, melena, or hematemesis Neurologic:  No visual changes, wkns, changes in  mental status. All other systems reviewed and are otherwise negative except as noted above.   Physical Exam:    VS:  There were no vitals taken for this visit.   General: Well developed, well nourished,female appearing in no acute distress. Head: Normocephalic, atraumatic, sclera non-icteric, no xanthomas, nares are without discharge.  Neck: No carotid bruits. JVD not elevated.  Lungs: Respirations regular and unlabored, without wheezes or rales.  Heart: ***Regular rate and rhythm. No S3 or S4.  No murmur, no rubs, or gallops appreciated. Abdomen: Soft, non-tender, non-distended with normoactive bowel sounds. No hepatomegaly.  No rebound/guarding. No obvious abdominal masses. Msk:  Strength and tone appear normal for age. No joint deformities or effusions. Extremities: No clubbing or cyanosis. No edema.  Distal pedal pulses are 2+ bilaterally. Neuro: Alert and oriented X 3. Moves all extremities spontaneously. No focal deficits noted. Psych:  Responds to questions appropriately with a normal affect. Skin: No rashes or lesions noted  Wt Readings from Last 3 Encounters:  09/26/16 142 lb (64.4 kg)  04/17/16 137 lb (62.1 kg)  03/01/16 146 lb 9.7 oz (66.5 kg)        Studies/Labs Reviewed:   EKG:  EKG is*** ordered today.  The ekg ordered today demonstrates ***  Recent Labs: 10/21/2015: Magnesium 2.0; TSH 0.970 02/28/2016: B Natriuretic Peptide 890.2 08/13/2016: ALT 17; BUN 35; Creatinine, Ser 1.67; Hemoglobin 13.0; Platelets 193; Potassium 4.1; Sodium 140   Lipid Panel    Component Value Date/Time   CHOL 132 10/22/2015 1002   TRIG 62 10/22/2015 1002   HDL 48 10/22/2015 1002   CHOLHDL 2.8 10/22/2015 1002   VLDL 12 10/22/2015 1002   LDLCALC 72 10/22/2015 1002    Additional studies/ records that were reviewed today include:   Echocardiogram: 05/2015 Study Conclusions  - Left ventricle: The cavity size was normal. Wall thickness was   increased in a pattern of mild LVH.  Systolic function was normal.   The estimated ejection fraction was in the range of 55% to 60%.   Wall motion was normal; there were no regional wall motion   abnormalities. Doppler parameters are consistent with high   ventricular filling pressure. - Aortic valve: Valve area (VTI): 1.35 cm^2. Valve area (Vmax):   1.43 cm^2. Valve area (Vmean): 1.36 cm^2. - Mitral valve: There was mild regurgitation. - Right atrium: The atrium was mildly dilated. - Tricuspid valve: There was moderate regurgitation. - Pulmonary arteries: PA peak pressure: 58 mm Hg (S).  Assessment:    No diagnosis found.   Plan:   In order of problems listed above:  1. ***    Medication Adjustments/Labs and Tests Ordered: Current medicines are reviewed at length with the patient today.  Concerns regarding medicines are outlined above.  Medication changes, Labs and Tests ordered today are listed in the Patient Instructions below. There are no Patient Instructions on file for this visit.   Signed, Ellsworth Lennox, PA-C  09/29/2016 7:35 PM    [facility] [address] [place], [address] [place], [place]  [address] Phone: [phone]; Fax: [phone]  [address] [place], [place] [address] Phone: [phone]

## 2016-09-30 ENCOUNTER — Telehealth: Payer: Self-pay | Admitting: Internal Medicine

## 2016-09-30 ENCOUNTER — Ambulatory Visit: Payer: Medicare Other | Admitting: Student

## 2016-09-30 NOTE — Telephone Encounter (Signed)
Returned call to patient's daughter. She states her mother told her she had an appt today. Daughter was unaware of this and states she needs all appts to be scheduled w/her since she brings patient in to office. She cannot bring patient today for routine 6 month visit. Rescheduled patient for 10/10/16 w/Dr. [name] and cancelled 09/30/16 1030am appt w/B. Iran Ouch, PA

## 2016-09-30 NOTE — Telephone Encounter (Signed)
New Message  Pts daughter voiced for someone to give her a call back.  Please f/u

## 2016-10-10 ENCOUNTER — Ambulatory Visit (INDEPENDENT_AMBULATORY_CARE_PROVIDER_SITE_OTHER): Payer: Medicare Other | Admitting: Internal Medicine

## 2016-10-10 VITALS — BP 128/60 | HR 54 | Ht 61.0 in | Wt 142.2 lb

## 2016-10-10 DIAGNOSIS — I480 Paroxysmal atrial fibrillation: Secondary | ICD-10-CM

## 2016-10-10 DIAGNOSIS — I5030 Unspecified diastolic (congestive) heart failure: Secondary | ICD-10-CM

## 2016-10-10 DIAGNOSIS — I110 Hypertensive heart disease with heart failure: Secondary | ICD-10-CM

## 2016-10-10 DIAGNOSIS — N184 Chronic kidney disease, stage 4 (severe): Secondary | ICD-10-CM

## 2016-10-10 DIAGNOSIS — Z7901 Long term (current) use of anticoagulants: Secondary | ICD-10-CM

## 2016-10-10 NOTE — Patient Instructions (Signed)

## 2016-10-10 NOTE — Progress Notes (Signed)
OFFICE NOTE  Chief Complaint:  Right hip pain  Primary Care Physician: [name], PA-C  HPI:  Alexis [name] is a pleasant 83 year old female who was recently seen at any and for heart failure exacerbation. She presented at this couple of weeks of increasing shortness of breath, cough, increasing abdominal girth and lower extremity edema. On admission her BNP was elevated and she responded fairly quickly to diuresis. This caused improvement in her breathing. She was noted to be hypertensive and continues to be hypertensive today. She has had some acute on chronic renal insufficiency.  She reports that when she was discharged she was sent home on oxygen, but was never told why she needed it. She followed up with her primary care provider, who is with [facility] and he recommended that she see a cardiologist to help determine whether she needs to continue to take oxygen. She apparently sought out our group and on seeing her today.  Her past medical history is significant for hypertension, diabetes, dyslipidemia and family history of heart disease.  She reports that since discharge her breathing is better, however when she does marked exertion she does feel short of breath. She is not using her oxygen. She denies any chest pain.  An echocardiogram performed at [facility] showed an EF of 65-70%, with moderate concentric LVH, and no wall motion abnormalities.  She's never had a stress test evaluation.  Alexis [name] returns today for followup of her stress test. This is a nuclear stress test that showed no reversible ischemia and a preserved EF greater than 70%. She reports that she's been undergoing rehabilitation at home and feels that her shortness of breath is improving. It is now a point where she does not feel that she needs oxygen. We went ahead and ambulated her around the office today and her baseline SpO2 was 98% and decreased to 95% with exertion. This is markedly improved from  her previous office visit where her oxygen saturation dropped to 74% on room air.  Also at her last office visit I started her on amlodipine 10 mg daily and this helped both lower blood pressure and I suspect will ask her pulmonary artery pressures as well.  I saw Alexis [name] back today from her recent hospitalization. She was also due for annual follow-up. She was recently admitted for what sounds like a diastolic heart failure exacerbation, possibly precipitated by pneumonia and there was clear medication noncompliance. She was not taking her Lasix as prescribed. Also there may been some dietary noncompliance. She had about 6-8 pounds of weight gain which was diuresed down to 151 pounds on discharge. She is currently weighing 156 pounds at home and waited at 158 pounds in the office. She reports no significant shortness of breath and has been "religious" about taking her Lasix on a daily basis. She says she is eating more than she had before she is currently living with her daughter.  Alexis [name] was again hospitalized for diastolic heart failure/COPD. Again there is suspicion of possible noncompliance. She diuresed and was given antibiotics/steroids. Breathing is somewhat improved. She is currently on 3 L continuous oxygen. She has not been back to see her pulmonologist yet. She was placed on Lasix 60 mg daily after a pulse dose of Lasix 120 mg for 3 days. She is currently complaining of leg swelling and some shortness of breath with mild exertion.  I saw Alexis [name] again recently in the hospital this time in the setting  of acute GI bleeding. She was found to be anemic however a source of her bleeding was not identified. She did undergo EGD. She was also found to have new onset atrial fibrillation. Her CHADSVASC score is 6, therefore ultimately anticoagulation must be considered although at the time with her active bleeding it was not pursued. We talked at length today in the office about  anticoagulation and her stroke risk and she is deathly afraid of stroke, which is very reasonable given her risk. Given the possible options for anticoagulation, the safest option may be warfarin which could be easily reversed if recurrent bleeding is noted. She currently reports dark stools but is on twice daily iron.  Alexis [name] returns today for follow-up. She was recently hospitalized at [facility] and treated by the hospital medicine service for acute congestive heart failure/hypoxia related to COPD. A repeat echo in January 2017 showed normal LV systolic function with diastolic dysfunction and high LV filling pressure. She was diuresed and blood pressure was treated as she was noted to be markedly hypertensive on admission. At discharge her weight was 62 kg and she was transitioned to torsemide 40 mg every morning and 20 mg every afternoon. She does have stage IV chronic kidney disease which is been stable. She was taken off of aspirin but remains on warfarin for anticoagulation for her A. fib and CHADSVASC score of 6. She reports her weight has been stable. She denies any worsening shortness of breath. With a long discussion today about salt avoidance and it seems that there may be some dietary indiscretion as a cause of some of her recurrent heart failure exacerbations.  10/13/2015  Alexis [name] returns today for follow-up from her recent hospitalization. I saw her while she was hospitalized and help treat her for diastolic heart failure. This was thought related to atrial fibrillation which she was in more persistently. After we diuresed her, I cardioverted her and she has maintained a sinus bradycardia since then. She had some additional diuresis on follow-up with Theodore Demark, PA-C. Her weight is now down 3 additional pounds to a dry weight of 128 pounds. She said this is the best she's felt in several months. She is also made efforts to reduce sodium in her diet and is watching her weight very  carefully.  02/14/2016  Alexis [name] returns today for follow-up. She seems to be fairly stable. Her weight is now up though to 135 from 128. She says she's had no worsening swelling or shortness of breath. She says that her appetite is been really good except when you talk about the types of food she is eating it seems that her oral intake is not that high. I'm concerned that she is reaccumulating fluid. Blood pressure today was markedly elevated at 196/58. She thought that she would not take her blood pressure medicine because I was going to do a physical on her today. I don't understand why she thought that she should not take her blood pressure medicine. Unfortunate she does not have a blood pressure cuff at home and says she cannot afford one.  04/17/2016  Alexis [name] was seen in the office today. She was recently admitted for pneumonia and UTI with acute confusion in October. She subsequently recovered from this. INR was rechecked today and adjustments were made by pharmacy. She continues to have a low diastolic blood pressure of 34. Weight is been within a couple pounds of her baseline. She notes no worsening swelling. She does have a papular  and somewhat pruritic rash of the bilateral lower extremities from mid shin down to the ankle with some associated edema. This is somewhat excoriated. She's been applying Gold Bond to it.  10/10/2016  Alexis [name] was seen today in follow-up. Overall she seems to be doing well. She has not been rehospitalized since we last saw her. She remains on low-dose torsemide for diastolic heart failure. Blood pressures been stable and is at goal today 128/60. Weight is been stable. She denies any chest pain or worsening shortness of breath. Her main complaint today is right hip pain. She recently had a fall while trying to get into a car. He scraped up the left anterior shin and may have twisted her right leg. She does have a history of right hip surgery in the  past and I advised follow-up with her orthopedist.  PMHx:  Past Medical History:  Diagnosis Date  . Anxiety   . Chronic diastolic CHF (congestive heart failure) (HCC)    a. 05/2015 Echo: EF 55-60%, mild LVH, no rwma, mild MR, mildly dil RA, mod TR, PASP .  . CKD (chronic kidney disease), stage IV (HCC)   . Confusion state   . COPD (chronic obstructive pulmonary disease) (HCC)    a. on home O2 @ 3lpm.  . Elevated troponin    a. H/o elevated trop in setting of CHF; b. 06/2013 Lexiscan MV: nl EF, mild anterior breast attenuation, no ischemia.   . Family history of adverse reaction to anesthesia   . GIB (gastrointestinal bleeding)    a. History of GIB.  Marland Kitchen Hypertensive heart disease   . Macular degeneration, bilateral   . Neuropathy   . Paroxysmal atrial fibrillation (HCC)    a. CHA2DS2VASc = 6-->coumadin.  Marland Kitchen PONV (postoperative nausea and vomiting)   . Type II diabetes mellitus (HCC)   . Varicose veins     Past Surgical History:  Procedure Laterality Date  . ABDOMINAL HYSTERECTOMY    . CARDIOVERSION N/A 09/22/2015   Procedure: CARDIOVERSION;  Surgeon: Chrystie Nose, MD;  Location: [facility] ENDOSCOPY;  Service: Cardiovascular;  Laterality: N/A;  . ESOPHAGOGASTRODUODENOSCOPY N/A 12/01/2014   Procedure: ESOPHAGOGASTRODUODENOSCOPY (EGD);  Surgeon: Hilarie Fredrickson, MD;  Location: [facility] ENDOSCOPY;  Service: Endoscopy;  Laterality: N/A;  . HIP FRACTURE SURGERY  2004  . VEIN SURGERY      FAMHx:  Family History  Problem Relation Age of Onset  . Colon cancer Father 68       advanced when discovered, no surgery or therapy.  this led to his death  . Breast cancer Mother   . Hypertension Mother   . Breast cancer Sister   . Cancer Brother   . Lung cancer Brother   . Thyroid disease Neg Hx   . Heart attack Neg Hx     SOCHx:   reports that she is a non-smoker but has been exposed to tobacco smoke. She has never used smokeless tobacco. She reports that she does not drink alcohol or use  drugs.  ALLERGIES:  Allergies  Allergen Reactions  . Yellow Dyes (Non-Tartrazine) Nausea And Vomiting    "deathly allergic" No other information given to explain reaction. Per Daughter- this was an IV dye. Ok with yellow coloring on Coumadin tablets.   . Codeine Hives  . Other Other (See Comments)    novocaine broke mouth out  . Penicillins Hives  . Procaine Hives    ROS: A comprehensive review of systems was negative.  HOME MEDS: Current Outpatient  Prescriptions  Medication Sig Dispense Refill  . albuterol (PROVENTIL HFA;VENTOLIN HFA) 108 (90 BASE) MCG/ACT inhaler Inhale 2 puffs into the lungs every 6 (six) hours as needed for wheezing or shortness of breath. 1 Inhaler 3  . atorvastatin (LIPITOR) 20 MG tablet Take 20 mg by mouth daily.    . cholecalciferol (VITAMIN D) 1000 UNITS tablet Take 1,000 Units by mouth every morning.     . docusate sodium (COLACE) 100 MG capsule Take 100 mg by mouth 2 (two) times daily as needed for mild constipation.    Marland Kitchen escitalopram (LEXAPRO) 10 MG tablet Take 10 mg by mouth daily.    . fenofibrate 160 MG tablet Take 160 mg by mouth daily.    Marland Kitchen gabapentin (NEURONTIN) 100 MG capsule Take 1 capsule (100 mg total) by mouth at bedtime. 30 capsule 0  . iron polysaccharides (NIFEREX) 150 MG capsule Take 1 capsule (150 mg total) by mouth daily. 30 capsule 3  . isosorbide mononitrate (IMDUR) 30 MG 24 hr tablet TAKE 1 TABLET BY MOUTH EVERY DAY 30 tablet 6  . levofloxacin (LEVAQUIN) 250 MG tablet Take 1 tablet (250 mg total) by mouth every other day. 2 tablet 0  . metoprolol tartrate (LOPRESSOR) 25 MG tablet Take 0.5 tablets (12.5 mg total) by mouth 2 (two) times daily. 60 tablet 5  . Multiple Vitamin (MULTIVITAMIN WITH MINERALS) TABS tablet Take 1 tablet by mouth daily.    . ondansetron (ZOFRAN) 4 MG tablet Take 1 tablet (4 mg total) by mouth every 6 (six) hours. 12 tablet 0  . OXYGEN Inhale 3 L into the lungs at bedtime.     . polyethylene glycol (MIRALAX /  GLYCOLAX) packet Take 17 g by mouth daily as needed for mild constipation.    . potassium chloride SA (KLOR-CON M20) 20 MEQ tablet Take 1 tablet (20 mEq total) by mouth daily. 30 tablet 5  . torsemide (DEMADEX) 20 MG tablet Take 2 tablets (40 mg total) by mouth daily. 180 tablet 3  . warfarin (COUMADIN) 4 MG tablet Take 1 to 1.5 tablets daily as directed by coumadin clinic 120 tablet 1   No current facility-administered medications for this visit.     LABS/IMAGING: No results found for this or any previous visit (from the past 48 hour(s)). No results found.  VITALS: BP 128/60   Pulse (!) 54   Ht 5\' 1"  (1.549 m)   Wt 142 lb 3.2 oz (64.5 kg)   BMI 26.87 kg/m   EXAM: General appearance: alert and no distress Neck: no carotid bruit and no JVD Lungs: clear to auscultation bilaterally Heart: regular rate and rhythm, S1, S2 normal, no murmur, click, rub or gallop Abdomen: soft, non-tender; bowel sounds normal; no masses,  no organomegaly Extremities: venous stasis dermatitis noted Pulses: 2+ and symmetric Skin: Skin color, texture, turgor normal. No rashes or lesions Neurologic: Grossly normal Psych: Pleasant  EKG: Sinus bradycardia with first-degree AV block at 54  ASSESSMENT: 1. History of acute diastolic heart failure (EF 55-60% by echo in 05/2015) 2. Paroxysmal atrial fibrillation-CHADSVASC score of 6 on warfarin, s/p cardioversion - in sinus rhythm today 3. Anemia  4. Hypertensive heart disease with moderate LVH 5. Hypertension - controlled 6. Dyslipidemia 7. Diabetes type 2 8. Family history of heart disease 9. COPD - was on 3 L O2   PLAN: 1.   Alexis [name] has been stable from a cardiac arrest or standpoint without any worsening edema or shortness of breath. She is on low-dose  torsemide. She's a history of PAF with a CHADSVASC score 6 on warfarin. Today she is in sinus rhythm. Cholesterol diabetes are followed by primary care provider. She was previously on 3 L oxygen  continuously but says she's currently using 2 L at night only. No changes in her medications today. I've advised to follow-up with a per orthopedic surgeon if she continues to have right hip pain.   Follow-up in 6 months.  Chrystie Nose, MD, [facility] Attending Cardiologist [facility]  Chrystie Nose 10/10/2016, 11:58 AM

## 2016-10-22 ENCOUNTER — Ambulatory Visit (INDEPENDENT_AMBULATORY_CARE_PROVIDER_SITE_OTHER): Payer: Medicare Other | Admitting: Pharmacist Clinician (PhC)/ Clinical Pharmacy Specialist

## 2016-10-22 DIAGNOSIS — Z7901 Long term (current) use of anticoagulants: Secondary | ICD-10-CM | POA: Diagnosis not present

## 2016-10-22 DIAGNOSIS — I480 Paroxysmal atrial fibrillation: Secondary | ICD-10-CM

## 2016-10-22 DIAGNOSIS — I481 Persistent atrial fibrillation: Secondary | ICD-10-CM

## 2016-10-22 DIAGNOSIS — I4819 Other persistent atrial fibrillation: Secondary | ICD-10-CM

## 2016-10-22 LAB — POCT INR: INR: 2.2

## 2016-11-19 ENCOUNTER — Ambulatory Visit (INDEPENDENT_AMBULATORY_CARE_PROVIDER_SITE_OTHER): Payer: Medicare Other | Admitting: Pharmacist

## 2016-11-19 DIAGNOSIS — Z7901 Long term (current) use of anticoagulants: Secondary | ICD-10-CM | POA: Diagnosis not present

## 2016-11-19 DIAGNOSIS — I480 Paroxysmal atrial fibrillation: Secondary | ICD-10-CM | POA: Diagnosis not present

## 2016-11-19 DIAGNOSIS — I4819 Other persistent atrial fibrillation: Secondary | ICD-10-CM

## 2016-11-19 DIAGNOSIS — I481 Persistent atrial fibrillation: Secondary | ICD-10-CM | POA: Diagnosis not present

## 2016-11-19 LAB — POCT INR: INR: 2

## 2016-11-24 ENCOUNTER — Other Ambulatory Visit: Payer: Self-pay | Admitting: Internal Medicine

## 2016-11-25 ENCOUNTER — Other Ambulatory Visit: Payer: Self-pay | Admitting: Internal Medicine

## 2016-12-15 ENCOUNTER — Other Ambulatory Visit: Payer: Self-pay | Admitting: Internal Medicine

## 2016-12-17 ENCOUNTER — Ambulatory Visit (INDEPENDENT_AMBULATORY_CARE_PROVIDER_SITE_OTHER): Payer: Medicare Other | Admitting: Pharmacist Clinician (PhC)/ Clinical Pharmacy Specialist

## 2016-12-17 DIAGNOSIS — I4819 Other persistent atrial fibrillation: Secondary | ICD-10-CM

## 2016-12-17 DIAGNOSIS — Z7901 Long term (current) use of anticoagulants: Secondary | ICD-10-CM | POA: Diagnosis not present

## 2016-12-17 DIAGNOSIS — I480 Paroxysmal atrial fibrillation: Secondary | ICD-10-CM

## 2016-12-17 DIAGNOSIS — I481 Persistent atrial fibrillation: Secondary | ICD-10-CM | POA: Diagnosis not present

## 2016-12-17 LAB — POCT INR: INR: 2.1

## 2016-12-30 ENCOUNTER — Other Ambulatory Visit: Payer: Self-pay | Admitting: *Deleted

## 2016-12-30 MED ORDER — METOPROLOL TARTRATE 25 MG PO TABS: 12.5000 mg | ORAL_TABLET | Freq: Two times a day (BID) | ORAL | 5 refills | Status: AC

## 2017-01-14 ENCOUNTER — Ambulatory Visit (INDEPENDENT_AMBULATORY_CARE_PROVIDER_SITE_OTHER): Payer: Medicare Other | Admitting: Pharmacist

## 2017-01-14 DIAGNOSIS — I481 Persistent atrial fibrillation: Secondary | ICD-10-CM | POA: Diagnosis not present

## 2017-01-14 DIAGNOSIS — I480 Paroxysmal atrial fibrillation: Secondary | ICD-10-CM | POA: Diagnosis not present

## 2017-01-14 DIAGNOSIS — Z7901 Long term (current) use of anticoagulants: Secondary | ICD-10-CM | POA: Diagnosis not present

## 2017-01-14 DIAGNOSIS — I4819 Other persistent atrial fibrillation: Secondary | ICD-10-CM

## 2017-01-14 LAB — POCT INR: INR: 4.7

## 2017-01-20 ENCOUNTER — Ambulatory Visit (INDEPENDENT_AMBULATORY_CARE_PROVIDER_SITE_OTHER): Payer: Medicare Other | Admitting: Pharmacist Clinician (PhC)/ Clinical Pharmacy Specialist

## 2017-01-20 DIAGNOSIS — I4819 Other persistent atrial fibrillation: Secondary | ICD-10-CM

## 2017-01-20 DIAGNOSIS — I481 Persistent atrial fibrillation: Secondary | ICD-10-CM

## 2017-01-20 DIAGNOSIS — Z7901 Long term (current) use of anticoagulants: Secondary | ICD-10-CM | POA: Diagnosis not present

## 2017-01-20 DIAGNOSIS — I480 Paroxysmal atrial fibrillation: Secondary | ICD-10-CM

## 2017-01-20 LAB — POCT INR: INR: 1.9

## 2017-02-03 ENCOUNTER — Ambulatory Visit (INDEPENDENT_AMBULATORY_CARE_PROVIDER_SITE_OTHER): Payer: Medicare Other | Admitting: Pharmacist

## 2017-02-03 DIAGNOSIS — I480 Paroxysmal atrial fibrillation: Secondary | ICD-10-CM

## 2017-02-03 DIAGNOSIS — I481 Persistent atrial fibrillation: Secondary | ICD-10-CM

## 2017-02-03 DIAGNOSIS — I4819 Other persistent atrial fibrillation: Secondary | ICD-10-CM

## 2017-02-03 DIAGNOSIS — Z7901 Long term (current) use of anticoagulants: Secondary | ICD-10-CM | POA: Diagnosis not present

## 2017-02-03 LAB — POCT INR: INR: 4.6

## 2017-02-03 IMAGING — CR DG CHEST 1V PORT
1 series · 1 of 1 positions shown · non-contrast
Comparison: 05/12/2014.

CLINICAL DATA: Short of breath. Symptoms for 3 days. Initial
encounter. Diabetes.

EXAM:
PORTABLE CHEST - 1 VIEW

[AP]
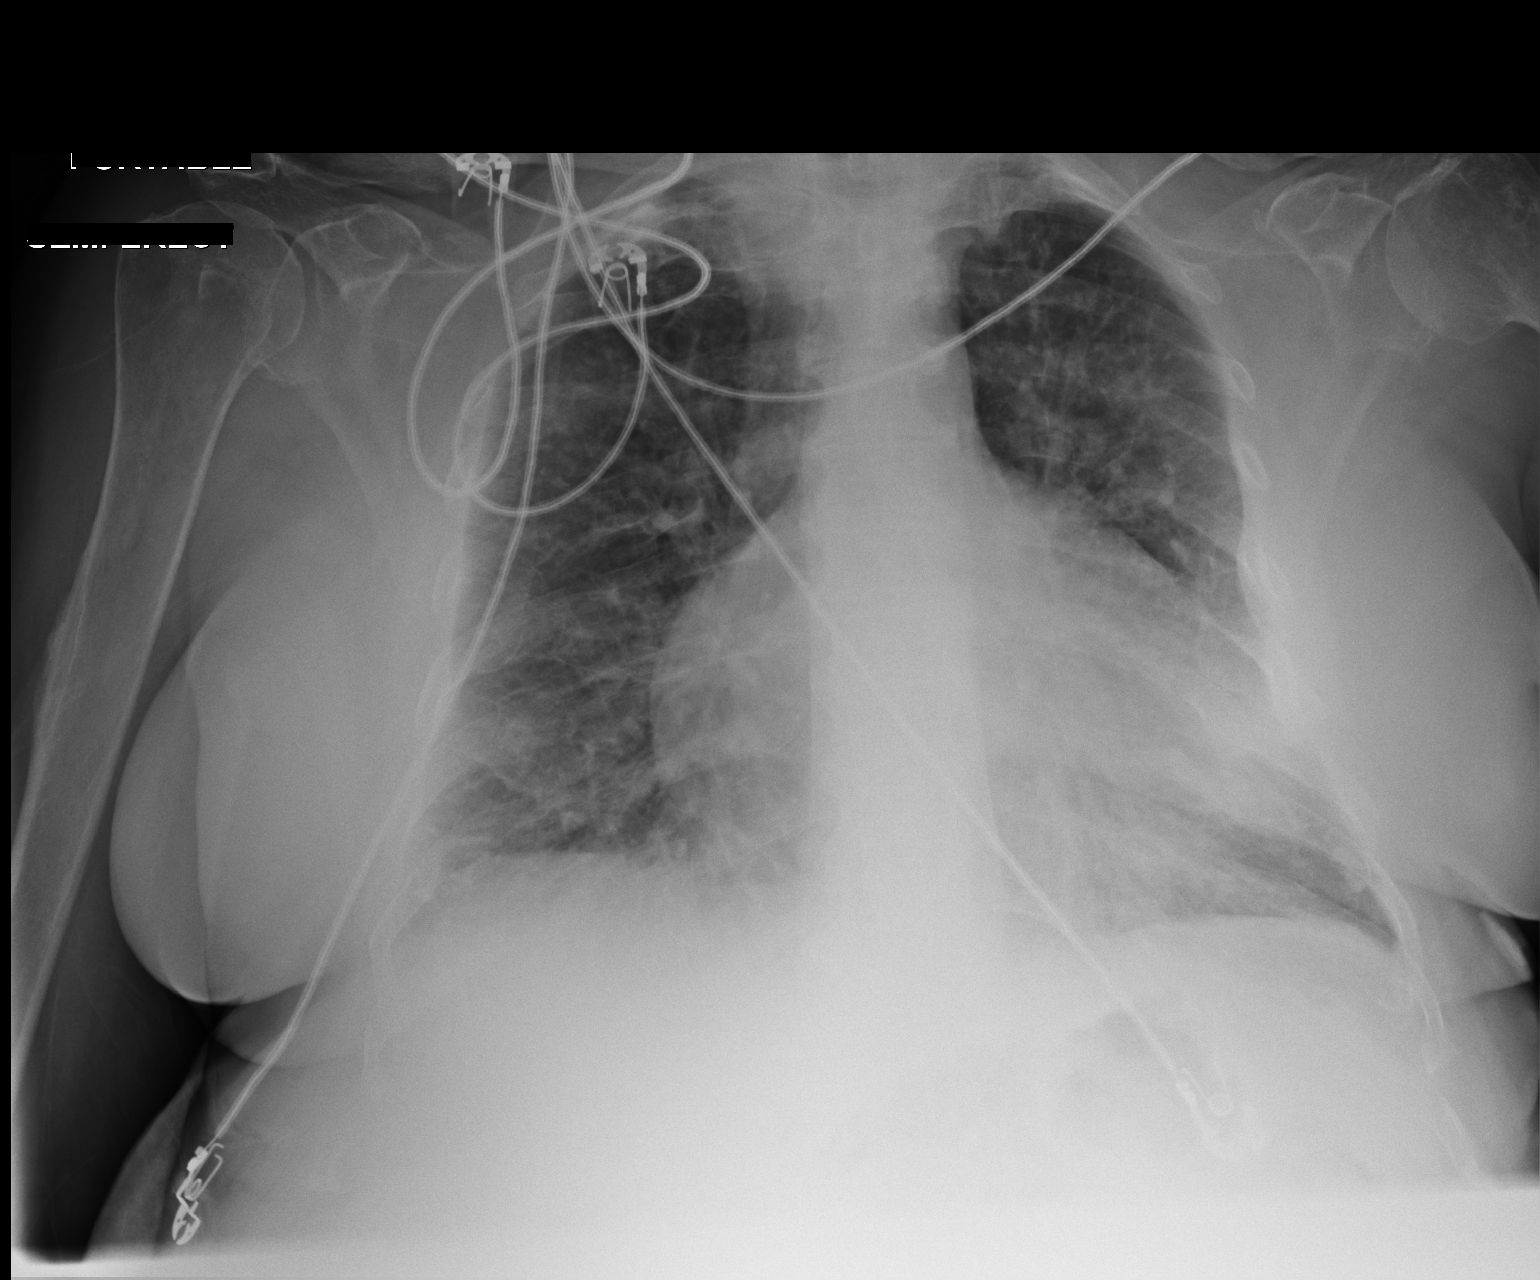

[1 of 1 positions shown; findings below may reference images not displayed]

FINDINGS: Apical lordotic projection. Cardiomegaly. Pulmonary vascular
congestion is present with interstitial pulmonary edema. There is
increased opacity over the LEFT lateral chest, which is favored to
be due to overlapping soft tissues and breast shadow combined with
interstitial edema and atelectasis. Focal airspace disease such as
pneumonia could also produce this appearance but is considered less
likely. Monitoring leads project over the chest.
IMPRESSION: Combination of findings compatible with mild CHF.

## 2017-02-13 ENCOUNTER — Encounter (HOSPITAL_COMMUNITY): Payer: Self-pay | Admitting: Emergency Medicine

## 2017-02-13 DIAGNOSIS — R531 Weakness: Secondary | ICD-10-CM | POA: Diagnosis not present

## 2017-02-13 DIAGNOSIS — N184 Chronic kidney disease, stage 4 (severe): Secondary | ICD-10-CM | POA: Insufficient documentation

## 2017-02-13 DIAGNOSIS — F419 Anxiety disorder, unspecified: Secondary | ICD-10-CM | POA: Diagnosis not present

## 2017-02-13 DIAGNOSIS — Z7901 Long term (current) use of anticoagulants: Secondary | ICD-10-CM | POA: Insufficient documentation

## 2017-02-13 DIAGNOSIS — M545 Low back pain: Secondary | ICD-10-CM | POA: Diagnosis not present

## 2017-02-13 DIAGNOSIS — I5032 Chronic diastolic (congestive) heart failure: Secondary | ICD-10-CM | POA: Diagnosis not present

## 2017-02-13 DIAGNOSIS — E1122 Type 2 diabetes mellitus with diabetic chronic kidney disease: Secondary | ICD-10-CM | POA: Insufficient documentation

## 2017-02-13 DIAGNOSIS — R4182 Altered mental status, unspecified: Secondary | ICD-10-CM | POA: Insufficient documentation

## 2017-02-13 DIAGNOSIS — E1142 Type 2 diabetes mellitus with diabetic polyneuropathy: Secondary | ICD-10-CM | POA: Diagnosis not present

## 2017-02-13 DIAGNOSIS — E1151 Type 2 diabetes mellitus with diabetic peripheral angiopathy without gangrene: Secondary | ICD-10-CM | POA: Diagnosis not present

## 2017-02-13 DIAGNOSIS — Z79899 Other long term (current) drug therapy: Secondary | ICD-10-CM | POA: Diagnosis not present

## 2017-02-13 DIAGNOSIS — E042 Nontoxic multinodular goiter: Secondary | ICD-10-CM | POA: Diagnosis not present

## 2017-02-13 DIAGNOSIS — I130 Hypertensive heart and chronic kidney disease with heart failure and stage 1 through stage 4 chronic kidney disease, or unspecified chronic kidney disease: Secondary | ICD-10-CM | POA: Insufficient documentation

## 2017-02-13 DIAGNOSIS — Z7722 Contact with and (suspected) exposure to environmental tobacco smoke (acute) (chronic): Secondary | ICD-10-CM | POA: Insufficient documentation

## 2017-02-13 DIAGNOSIS — I480 Paroxysmal atrial fibrillation: Secondary | ICD-10-CM | POA: Insufficient documentation

## 2017-02-13 DIAGNOSIS — F329 Major depressive disorder, single episode, unspecified: Secondary | ICD-10-CM | POA: Diagnosis not present

## 2017-02-13 DIAGNOSIS — Z9981 Dependence on supplemental oxygen: Secondary | ICD-10-CM | POA: Diagnosis not present

## 2017-02-13 DIAGNOSIS — N179 Acute kidney failure, unspecified: Secondary | ICD-10-CM | POA: Diagnosis not present

## 2017-02-13 DIAGNOSIS — E785 Hyperlipidemia, unspecified: Secondary | ICD-10-CM | POA: Insufficient documentation

## 2017-02-13 DIAGNOSIS — J449 Chronic obstructive pulmonary disease, unspecified: Secondary | ICD-10-CM | POA: Diagnosis not present

## 2017-02-13 DIAGNOSIS — Z66 Do not resuscitate: Secondary | ICD-10-CM | POA: Diagnosis not present

## 2017-02-13 DIAGNOSIS — I700 Atherosclerosis of aorta: Secondary | ICD-10-CM | POA: Insufficient documentation

## 2017-02-13 DIAGNOSIS — N390 Urinary tract infection, site not specified: Principal | ICD-10-CM | POA: Insufficient documentation

## 2017-02-13 DIAGNOSIS — R296 Repeated falls: Secondary | ICD-10-CM | POA: Diagnosis not present

## 2017-02-13 LAB — COMPREHENSIVE METABOLIC PANEL
ALT: 14 U/L (ref 14–54)
ANION GAP: 7 (ref 5–15)
AST: 17 U/L (ref 15–41)
Albumin: 3 g/dL — ABNORMAL LOW (ref 3.5–5.0)
Alkaline Phosphatase: 143 U/L — ABNORMAL HIGH (ref 38–126)
BUN: 42 mg/dL — ABNORMAL HIGH (ref 6–20)
CO2: 28 mmol/L (ref 22–32)
CREATININE: 2.31 mg/dL — AB (ref 0.44–1.00)
Calcium: 8.8 mg/dL — ABNORMAL LOW (ref 8.9–10.3)
Chloride: 102 mmol/L (ref 101–111)
GFR, EST AFRICAN AMERICAN: 21 mL/min — AB (ref >60)
GFR, EST NON AFRICAN AMERICAN: 18 mL/min — AB (ref >60)
Glucose, Bld: 289 mg/dL — ABNORMAL HIGH (ref 65–99)
Potassium: 3.9 mmol/L (ref 3.5–5.1)
SODIUM: 137 mmol/L (ref 135–145)
Total Bilirubin: 0.3 mg/dL (ref 0.3–1.2)
Total Protein: 6.4 g/dL — ABNORMAL LOW (ref 6.5–8.1)

## 2017-02-13 LAB — CBC WITH DIFFERENTIAL/PLATELET
Basophils Absolute: 0 10*3/uL (ref 0.0–0.1)
Basophils Relative: 0 %
Eosinophils Absolute: 0.2 10*3/uL (ref 0.0–0.7)
Eosinophils Relative: 3 %
HEMATOCRIT: 37.7 % (ref 36.0–46.0)
HEMOGLOBIN: 12.2 g/dL (ref 12.0–15.0)
LYMPHS ABS: 1.1 10*3/uL (ref 0.7–4.0)
Lymphocytes Relative: 14 %
MCH: 27.5 pg (ref 26.0–34.0)
MCHC: 32.4 g/dL (ref 30.0–36.0)
MCV: 84.9 fL (ref 78.0–100.0)
MONOS PCT: 8 %
Monocytes Absolute: 0.7 10*3/uL (ref 0.1–1.0)
NEUTROS PCT: 75 %
Neutro Abs: 6 10*3/uL (ref 1.7–7.7)
Platelets: 224 10*3/uL (ref 150–400)
RBC: 4.44 MIL/uL (ref 3.87–5.11)
RDW: 15.1 % (ref 11.5–15.5)
WBC: 8.1 10*3/uL (ref 4.0–10.5)

## 2017-02-13 LAB — URINALYSIS, ROUTINE W REFLEX MICROSCOPIC
BILIRUBIN URINE: NEGATIVE
Glucose, UA: 50 mg/dL — AB
KETONES UR: NEGATIVE mg/dL
Nitrite: NEGATIVE
Protein, ur: 100 mg/dL — AB
SPECIFIC GRAVITY, URINE: 1.011 (ref 1.005–1.030)
pH: 5 (ref 5.0–8.0)

## 2017-02-13 LAB — I-STAT CG4 LACTIC ACID, ED: LACTIC ACID, VENOUS: 1.71 mmol/L (ref 0.5–1.9)

## 2017-02-13 NOTE — ED Triage Notes (Signed)
Patient arrives with complaint of multiple recent falls. Currently being treated for UTI and is on 2nd course of abx. Incontinence recently as well. Complaining of lower extremity pain and weakness too. Mild confusion in triage. Family states it seems worse than usual.

## 2017-02-14 ENCOUNTER — Encounter (HOSPITAL_COMMUNITY): Payer: Self-pay | Admitting: Internal Medicine

## 2017-02-14 ENCOUNTER — Emergency Department (HOSPITAL_COMMUNITY): Payer: Medicare Other

## 2017-02-14 ENCOUNTER — Observation Stay (HOSPITAL_COMMUNITY)
Admission: EM | Admit: 2017-02-14 | Discharge: 2017-02-15 | Disposition: A | Discharge status: Home / Self Care | Payer: Medicare Other | Attending: Internal Medicine | Admitting: Internal Medicine

## 2017-02-14 DIAGNOSIS — J449 Chronic obstructive pulmonary disease, unspecified: Secondary | ICD-10-CM | POA: Diagnosis present

## 2017-02-14 DIAGNOSIS — R296 Repeated falls: Secondary | ICD-10-CM

## 2017-02-14 DIAGNOSIS — E785 Hyperlipidemia, unspecified: Secondary | ICD-10-CM | POA: Diagnosis present

## 2017-02-14 DIAGNOSIS — N179 Acute kidney failure, unspecified: Secondary | ICD-10-CM | POA: Diagnosis present

## 2017-02-14 DIAGNOSIS — Z7901 Long term (current) use of anticoagulants: Secondary | ICD-10-CM

## 2017-02-14 DIAGNOSIS — I700 Atherosclerosis of aorta: Secondary | ICD-10-CM

## 2017-02-14 DIAGNOSIS — I739 Peripheral vascular disease, unspecified: Secondary | ICD-10-CM

## 2017-02-14 DIAGNOSIS — N3000 Acute cystitis without hematuria: Secondary | ICD-10-CM

## 2017-02-14 DIAGNOSIS — I10 Essential (primary) hypertension: Secondary | ICD-10-CM | POA: Diagnosis present

## 2017-02-14 DIAGNOSIS — N184 Chronic kidney disease, stage 4 (severe): Secondary | ICD-10-CM | POA: Diagnosis present

## 2017-02-14 DIAGNOSIS — N390 Urinary tract infection, site not specified: Secondary | ICD-10-CM | POA: Diagnosis present

## 2017-02-14 DIAGNOSIS — E119 Type 2 diabetes mellitus without complications: Secondary | ICD-10-CM

## 2017-02-14 DIAGNOSIS — I480 Paroxysmal atrial fibrillation: Secondary | ICD-10-CM | POA: Diagnosis present

## 2017-02-14 DIAGNOSIS — I5030 Unspecified diastolic (congestive) heart failure: Secondary | ICD-10-CM | POA: Diagnosis present

## 2017-02-14 DIAGNOSIS — R4182 Altered mental status, unspecified: Secondary | ICD-10-CM | POA: Diagnosis present

## 2017-02-14 DIAGNOSIS — R531 Weakness: Secondary | ICD-10-CM

## 2017-02-14 LAB — HEMOGLOBIN A1C
HEMOGLOBIN A1C: 8.4 % — AB (ref 4.8–5.6)
Mean Plasma Glucose: 194.38 mg/dL

## 2017-02-14 LAB — PROTIME-INR
INR: 1.57
Prothrombin Time: 18.6 seconds — ABNORMAL HIGH (ref 11.4–15.2)

## 2017-02-14 LAB — GLUCOSE, CAPILLARY: Glucose-Capillary: 249 mg/dL — ABNORMAL HIGH (ref 65–99)

## 2017-02-14 MED ORDER — SODIUM CHLORIDE 0.9 % IV SOLN: 250.0000 mL | INTRAVENOUS | Status: DC | PRN

## 2017-02-14 MED ORDER — ATORVASTATIN CALCIUM 20 MG PO TABS
20.0000 mg | ORAL_TABLET | Freq: Every day | ORAL | Status: DC
  Administered 2017-02-14 – 2017-02-15 (×2): 20 mg via ORAL
  Filled 2017-02-14 (×2): qty 1

## 2017-02-14 MED ORDER — SODIUM CHLORIDE 0.9 % IV BOLUS (SEPSIS)
500.0000 mL | Freq: Once | INTRAVENOUS | Status: AC
  Administered 2017-02-14: 500 mL via INTRAVENOUS

## 2017-02-14 MED ORDER — LABETALOL HCL 5 MG/ML IV SOLN
5.0000 mg | Freq: Once | INTRAVENOUS | Status: AC
  Administered 2017-02-14: 5 mg via INTRAVENOUS
  Filled 2017-02-14: qty 4

## 2017-02-14 MED ORDER — ACETAMINOPHEN 650 MG RE SUPP: 650.0000 mg | Freq: Four times a day (QID) | RECTAL | Status: DC | PRN

## 2017-02-14 MED ORDER — ESCITALOPRAM OXALATE 10 MG PO TABS
10.0000 mg | ORAL_TABLET | Freq: Every day | ORAL | Status: DC
  Administered 2017-02-14 – 2017-02-15 (×2): 10 mg via ORAL
  Filled 2017-02-14 (×2): qty 1

## 2017-02-14 MED ORDER — SODIUM CHLORIDE 0.9% FLUSH
3.0000 mL | Freq: Two times a day (BID) | INTRAVENOUS | Status: DC
  Administered 2017-02-14 – 2017-02-15 (×2): 3 mL via INTRAVENOUS

## 2017-02-14 MED ORDER — ONDANSETRON HCL 4 MG PO TABS: 4.0000 mg | ORAL_TABLET | Freq: Four times a day (QID) | ORAL | Status: DC | PRN

## 2017-02-14 MED ORDER — INSULIN ASPART 100 UNIT/ML ~~LOC~~ SOLN
0.0000 [IU] | Freq: Three times a day (TID) | SUBCUTANEOUS | Status: DC
  Administered 2017-02-15: 3 [IU] via SUBCUTANEOUS
  Administered 2017-02-15: 2 [IU] via SUBCUTANEOUS

## 2017-02-14 MED ORDER — CEFTRIAXONE SODIUM 1 G IJ SOLR
1.0000 g | Freq: Once | INTRAMUSCULAR | Status: AC
  Administered 2017-02-14: 1 g via INTRAVENOUS
  Filled 2017-02-14: qty 10

## 2017-02-14 MED ORDER — POLYSACCHARIDE IRON COMPLEX 150 MG PO CAPS
150.0000 mg | ORAL_CAPSULE | Freq: Every day | ORAL | Status: DC
  Administered 2017-02-15: 150 mg via ORAL
  Filled 2017-02-14 (×2): qty 1

## 2017-02-14 MED ORDER — ISOSORBIDE MONONITRATE ER 30 MG PO TB24
30.0000 mg | ORAL_TABLET | Freq: Every day | ORAL | Status: DC
  Administered 2017-02-14 – 2017-02-15 (×2): 30 mg via ORAL
  Filled 2017-02-14 (×2): qty 1

## 2017-02-14 MED ORDER — ONDANSETRON HCL 4 MG/2ML IJ SOLN
4.0000 mg | Freq: Four times a day (QID) | INTRAMUSCULAR | Status: DC | PRN
  Administered 2017-02-14: 4 mg via INTRAVENOUS
  Filled 2017-02-14 (×2): qty 2

## 2017-02-14 MED ORDER — SODIUM CHLORIDE 0.9 % IV SOLN
INTRAVENOUS | Status: DC
  Administered 2017-02-14: 09:00:00 via INTRAVENOUS

## 2017-02-14 MED ORDER — SODIUM CHLORIDE 0.9% FLUSH: 3.0000 mL | INTRAVENOUS | Status: DC | PRN

## 2017-02-14 MED ORDER — POLYETHYLENE GLYCOL 3350 17 G PO PACK: 17.0000 g | PACK | Freq: Every day | ORAL | Status: DC | PRN

## 2017-02-14 MED ORDER — HYDRALAZINE HCL 50 MG PO TABS
50.0000 mg | ORAL_TABLET | Freq: Two times a day (BID) | ORAL | Status: DC
  Administered 2017-02-14 – 2017-02-15 (×3): 50 mg via ORAL
  Filled 2017-02-14 (×2): qty 1
  Filled 2017-02-14: qty 2

## 2017-02-14 MED ORDER — ACETAMINOPHEN 325 MG PO TABS
650.0000 mg | ORAL_TABLET | Freq: Four times a day (QID) | ORAL | Status: DC | PRN
  Administered 2017-02-14: 650 mg via ORAL
  Filled 2017-02-14: qty 2

## 2017-02-14 MED ORDER — DOCUSATE SODIUM 100 MG PO CAPS
100.0000 mg | ORAL_CAPSULE | Freq: Two times a day (BID) | ORAL | Status: DC | PRN
Start: 2017-02-14 — End: 2017-02-15

## 2017-02-14 MED ORDER — FENOFIBRATE 160 MG PO TABS
160.0000 mg | ORAL_TABLET | Freq: Every day | ORAL | Status: DC
  Administered 2017-02-15: 160 mg via ORAL
  Filled 2017-02-14: qty 1

## 2017-02-14 MED ORDER — WARFARIN - PHARMACIST DOSING INPATIENT: Freq: Every day | Status: DC

## 2017-02-14 MED ORDER — DEXTROSE 5 % IV SOLN
1.0000 g | INTRAVENOUS | Status: DC
  Administered 2017-02-15: 1 g via INTRAVENOUS
  Filled 2017-02-14: qty 10

## 2017-02-14 MED ORDER — METOPROLOL TARTRATE 12.5 MG HALF TABLET
12.5000 mg | ORAL_TABLET | Freq: Two times a day (BID) | ORAL | Status: DC
  Administered 2017-02-14 – 2017-02-15 (×3): 12.5 mg via ORAL
  Filled 2017-02-14 (×3): qty 1

## 2017-02-14 MED ORDER — HEPARIN SODIUM (PORCINE) 5000 UNIT/ML IJ SOLN
5000.0000 [IU] | Freq: Three times a day (TID) | INTRAMUSCULAR | Status: DC
  Administered 2017-02-14 – 2017-02-15 (×4): 5000 [IU] via SUBCUTANEOUS
  Filled 2017-02-14 (×4): qty 1

## 2017-02-14 MED ORDER — WARFARIN SODIUM 6 MG PO TABS
6.0000 mg | ORAL_TABLET | Freq: Once | ORAL | Status: DC
  Filled 2017-02-14: qty 1

## 2017-02-14 NOTE — Progress Notes (Addendum)
ANTICOAGULATION CONSULT NOTE  Pharmacy Consult for warfarin Indication: atrial fibrillation   Assessment: 22 yof presenting with AMS, multiple falls, urinary frequency/incontinence. Pt with hx of afib on warfarin PTA. Pharmacy consulted to dose inpatient. Noted has been on doxy and Macrobid PTA, now on ceftriaxone. INR subtherapeutic on admit at 1.57. CBC wnl. No bleed documented.  PTA warfarin dose: 4mg  daily except 6mg  on Mon/Fri per last outpatient [facility] clinic note 9/10 (awaiting family arrival for last doses)  Goal of Therapy:  INR 2-3 Monitor platelets by anticoagulation protocol: Yes   Plan:  Warfarin 6mg  PO x 1 dose Daily INR Monitor CBC, s/sx bleeding, DDI  Babs Bertin, PharmD, BCPS Clinical Pharmacist 02/14/2017 8:58 AM

## 2017-02-14 NOTE — ED Notes (Signed)
Pt c/opain at hips improves briefly with repositioning but then pain continues.

## 2017-02-14 NOTE — ED Notes (Signed)
Pt has pillow placed under her left bottom to take some pressure off of her. Pt reports that it is helpful. Verbalizes no others needs at this time.

## 2017-02-14 NOTE — ED Notes (Signed)
Pt c/o forieng body in left right eye and tearing noted but unable to see any objects. Will contact MD tio inform

## 2017-02-14 NOTE — ED Notes (Signed)
Attempted to call report

## 2017-02-14 NOTE — H&P (Signed)
Date: 02/14/2017               Patient Name:  Alexis [name] MRN: [id number]  DOB: [birth year] Age / Sex: 83 y.o., female   PCP: [name], PA-C         Medical Service: Internal Medicine Teaching Service         Attending Physician: Dr. Inez Catalina, MD    First Contact: Dr. Evelene Croon Pager: [phone]  Second Contact: Dr. Johnny Bridge Pager: [phone]       After Hours (After 5p/  First Contact Pager: [phone]  weekends / holidays): Second Contact Pager: [phone]   Chief Complaint: Urinary frequency and falls  History of Present Illness:  Ms. Meloche is an 83 year old female with a past medical history significant for non-insulin depedent diabetes mellitus type 2 with peripheral neuropathy, paroxsymal atrial fibrillation on warfarin, hypertension, chronic kidney disease stage IV, heart failure with preserved ejection fraction (EF 55-60%), hyperlipidemia, and possible COPD presenting with complaints of urinary frequency and incontinence and falls. She was initially seen at urgent care 01/14/17 with urinary complaints and diagnosed with a urinary tract infection and treated with doxycyline. Urine culture from that visit shows pan-sensitive E. Coli. She followed up with her primary care provideder on 9/10 with continued dysuria. Urinalysis again was suggestive of urinary tract infection and she was given a prescription for Macrobid and told to follow up in 1 week. Urine culture from that visit again showed pan-sensitive E. Coli. Today, she reports that her symptoms have not resolved with the antibiotics and she is still having urinary frequency, urgency and incontinence. States that she cannot always tell when she is urinating. She denies any fevers, chills, nausea, vomiting, abdominal pain, constipation or diarrhea. Reports generalized weakness in her bilateral lower extremities but denies any decreased sensation. Does note worsening of her chronic lower back pain. Daughter and son at bedside  also note that she has been more confused over the past two to three months and is having more frequent falls. They do note that Alzheimer's does run in her family, in her mother, sister, and aunt. They report to me that her mental status is at her baseline currently but that she has 'good days and bad days.' She denies any chest pain, cough, or shortness of breath.   In the ED, she was noted to be mildly confused and family did not believe she was at her normal baseline when seen by the EDP overnight. Urinalysis showed numerous white cells and many bacteria concerning for infection. Ceftriaxone was given and urine culture sent. No systemic signs of infection with stable vitals and no leukocytosis. Given her mild confusion and falls on anticoagulation CT head and C-spine was obtained which was unremarkable. XR of her lumbar spine, bilateral hips, bilateral knees, and tibia/fibula without any acute bony abnormalities. They were significant for aortic atherosclerosis and peripheral artery disease. Labs were only notable for mild bump in creatinine from her baseline. Given her confusion and persistent urinary symptoms and dirty UA despite antibiotics, IMTS was called for admission.   Meds:  No current facility-administered medications on file prior to encounter.    Current Outpatient Prescriptions on File Prior to Encounter  Medication Sig Dispense Refill  . albuterol (PROVENTIL HFA;VENTOLIN HFA) 108 (90 BASE) MCG/ACT inhaler Inhale 2 puffs into the lungs every 6 (six) hours as needed for wheezing or shortness of breath. 1 Inhaler 3  . atorvastatin (LIPITOR) 20 MG tablet Take 20  mg by mouth daily.    . cholecalciferol (VITAMIN D) 1000 UNITS tablet Take 1,000 Units by mouth every morning.     . docusate sodium (COLACE) 100 MG capsule Take 100 mg by mouth 2 (two) times daily as needed for mild constipation.    Marland Kitchen escitalopram (LEXAPRO) 10 MG tablet Take 10 mg by mouth daily.    . fenofibrate 160 MG tablet  Take 160 mg by mouth daily.    Marland Kitchen gabapentin (NEURONTIN) 100 MG capsule Take 1 capsule (100 mg total) by mouth at bedtime. 30 capsule 0  . iron polysaccharides (NIFEREX) 150 MG capsule Take 1 capsule (150 mg total) by mouth daily. 30 capsule 3  . isosorbide mononitrate (IMDUR) 30 MG 24 hr tablet TAKE 1 TABLET BY MOUTH EVERY DAY 30 tablet 6  . levofloxacin (LEVAQUIN) 250 MG tablet Take 1 tablet (250 mg total) by mouth every other day. 2 tablet 0  . metoprolol tartrate (LOPRESSOR) 25 MG tablet TAKE 1 TABLET BY MOUTH TWICE A DAY 60 tablet 3  . metoprolol tartrate (LOPRESSOR) 25 MG tablet Take 0.5 tablets (12.5 mg total) by mouth 2 (two) times daily. 60 tablet 5  . Multiple Vitamin (MULTIVITAMIN WITH MINERALS) TABS tablet Take 1 tablet by mouth daily.    . ondansetron (ZOFRAN) 4 MG tablet Take 1 tablet (4 mg total) by mouth every 6 (six) hours. 12 tablet 0  . OXYGEN Inhale 3 L into the lungs at bedtime.     . polyethylene glycol (MIRALAX / GLYCOLAX) packet Take 17 g by mouth daily as needed for mild constipation.    . potassium chloride SA (KLOR-CON M20) 20 MEQ tablet Take 1 tablet (20 mEq total) by mouth daily. 30 tablet 5  . torsemide (DEMADEX) 20 MG tablet Take 2 tablets (40 mg total) by mouth daily. 180 tablet 3  . warfarin (COUMADIN) 4 MG tablet Take 1 to 1.5 tablets daily as directed by coumadin clinic 120 tablet 1     Allergies: Allergies as of 02/13/2017 - Review Complete 02/13/2017  Allergen Reaction Noted  . Yellow dyes (non-tartrazine) Nausea And Vomiting 07/21/2013  . Codeine Hives 06/09/2013  . Other Other (See Comments) 06/09/2013  . Penicillins Hives 06/09/2013  . Procaine Hives 07/15/2013   Past Medical History:  Diagnosis Date  . Anxiety   . Chronic diastolic CHF (congestive heart failure) (HCC)    a. 05/2015 Echo: EF 55-60%, mild LVH, no rwma, mild MR, mildly dil RA, mod TR, PASP .  . CKD (chronic kidney disease), stage IV (HCC)   . Confusion state   . COPD (chronic  obstructive pulmonary disease) (HCC)    a. on home O2 @ 3lpm.  . Elevated troponin    a. H/o elevated trop in setting of CHF; b. 06/2013 Lexiscan MV: nl EF, mild anterior breast attenuation, no ischemia.   . Family history of adverse reaction to anesthesia   . GIB (gastrointestinal bleeding)    a. History of GIB.  Marland Kitchen Hypertensive heart disease   . Macular degeneration, bilateral   . Neuropathy   . Paroxysmal atrial fibrillation (HCC)    a. CHA2DS2VASc = 6-->coumadin.  Marland Kitchen PONV (postoperative nausea and vomiting)   . Type II diabetes mellitus (HCC)   . Varicose veins    Past Surgical History:  Procedure Laterality Date  . ABDOMINAL HYSTERECTOMY    . CARDIOVERSION N/A 09/22/2015   Procedure: CARDIOVERSION;  Surgeon: Chrystie Nose, MD;  Location: [facility] ENDOSCOPY;  Service: Cardiovascular;  Laterality: N/A;  .  ESOPHAGOGASTRODUODENOSCOPY N/A 12/01/2014   Procedure: ESOPHAGOGASTRODUODENOSCOPY (EGD);  Surgeon: Hilarie Fredrickson, MD;  Location: [facility] ENDOSCOPY;  Service: Endoscopy;  Laterality: N/A;  . HIP FRACTURE SURGERY  2004  . VEIN SURGERY     Family History:  Family History  Problem Relation Age of Onset  . Colon cancer Father 46       advanced when discovered, no surgery or therapy.  this led to his death  . Breast cancer Mother   . Hypertension Mother   . Alzheimer's disease Mother   . Breast cancer Sister   . Cancer Brother   . Lung cancer Brother   . Thyroid disease Neg Hx   . Heart attack Neg Hx    Social History:  Social History   Social History  . Marital status: Widowed    Spouse name: N/A  . Number of children: Y  . Years of education: N/A   Occupational History  . retired    Social History Main Topics  . Smoking status: Passive Smoke Exposure - Never Smoker  . Smokeless tobacco: Never Used  . Alcohol use No  . Drug use: No  . Sexual activity: Not Asked   Other Topics Concern  . None   Social History Narrative   Originally from [place]. She has always lived in [place] &  had no travel. She has significant smoke exposure through her husband for 60 years. She has no pets currently. No prior bird or mold exposure. 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 She lives in [place] with her son.   Review of Systems: A complete ROS was negative except as per HPI.   Physical Exam: Blood pressure (!) 172/63, pulse (!) 53, temperature 97.7 F (36.5 C), temperature source Oral, resp. rate 16, height 5\' 2"  (1.575 m), weight 148 lb (67.1 kg), SpO2 93 %. General: alert, well-developed, elderly woman in no acute distress and cooperative to examination.  Head: normocephalic and atraumatic.  Eyes: vision grossly intact, pupils equal, pupils round, pupils reactive to light, no injection and anicteric.  Mouth: pharynx pink and moist, no erythema, and no exudates.  Neck: supple, full ROM, no thyromegaly, no JVD, and no carotid bruits.  Lungs: normal respiratory effort, no accessory muscle use, normal breath sounds Heart: normal rate, regular rhythm, no murmur, no gallop, and no rub.  Abdomen: soft, non-tender, normal bowel sounds, no distention, no guarding Msk: no joint swelling, no joint warmth, and no redness over joints. Tenderness to palpation over bilateral lower back, no CVA tenderness. Pulses: 2+ DP/PT pulses bilaterally Extremities: No cyanosis, clubbing, edema Neurologic: alert & oriented to person and place but not time, cranial nerves II-XII intact, strength normal in all extremities, sensation intact to light touch, and gait not assessed Skin: turgor normal, numerous seborrheic keratosis across back  Psych: normal mood and affect  Assessment & Plan by Problem:  Urinary Tract Infection: Patient presents with complaints of urinary frequency, urgency, and incontinence. She has been seen as an outpatient on 8/21 and again on 9/10  for similar and been diagnosed with urinary tract infections. She initially received a week course of doxycycline and then a week course of Macrobid on the subsequent visit. Both urine cultures have grown pan-sensitive E.Coli. Urinalysis on arrival to the ED does show moderate leukocytes, with too numerous to count WBC, many bacteria, 0-5 squamous epi  but is nitrite negative. Previous urinalysis  as an outpatient were both nitrite positive. Son at bedside reports he give her her medicines and she has not missed any doses of her antibiotics. She has no CVA tenderness, nausea or vomiting. No systemic signs of infection. Do not suspect pyelonephritis at this time. It is unclear why she would have not responded to outpatient antibiotics but it is unclear why doxycycline and Macrobid were used. Family reports her symptoms have never improved on treatment. Macrobid should be avoided given her renal dysfunction. Urine still appears infected and urine culture is sent. She was started on Ceftriaxone in the ED. She has complaints of urinary incontinence since August as well. She is neurologically intact with good strength, sensation, and reflexes. Lumbar spine XR in the ED without any abnormalities. Her incontinence is likely related to the UTI. Will continue Ceftriaxone and follow urine culture.   Intermittent Confusion: Family reports she has intermittent mild confusion that they have noticed over the past 2-3 months. Reported she was not at her normal baseline when she came to the emergency department. Upon my examination she was alert and oriented to person and place but not time. Family reports that this is about her normal now but that she has good days and bad days and possibly some sundowning. Appears a lot of her confusion has been attributed to urinary tract infections recently and appears to have a UTI today. However, this appears to be slowly progressive over the past several months and appears to be more short  term memory loss issues though difficult to ascertain from family. There is a family history of Alzheimer's dementia. Some of her confusion may be attributable to UTI though there may be some underlying dementia. Treating infection. Will perform MOCA prior to discharge.   Frequent Falls: Patient has been having more frequent falls. Some of this may be attributed to her recent confusion however it appears to be a chronic problem. She uses a walker to get around. Son states that she falls a lot in the mornings when she is trying to get dressed on her own and is not using her walker. Will get PT/OT evaluation.   Uncontrolled Hypertension: SBP in the ED has ranged 170-200s. She is on Metoprolol 12.5 mg bid, Imdur 30 mg daily and hydralazine 50 mg bid at home. Presumably on this regimen due to her HFpEF and CKD prohibiting the use of ACE/ARB. Has not had any of her medications since yesterday morning. BP appears to be uncontrolled in the 170-180s at outpatient visits. Will resume home medications and monitor BP. Given that she is poorly controlled outpatient goal SBP <180 and will not drop to normotensive range to avoid inducing ischemic CVA. Will need further titration of BP medications outpatient. Low suspicion for possible hypertensive encephalopathy as patient is mentation well with her BP in the 200s and appears to stay 170-180s outpatient.   Type 2 Diabetes Mellitus: Appears to be diet controlled and is not on any medicaitons. Most recent A1c on 05/2016 was 7.3; presumable goal <8 given patients age and co-morbidities.  Glucose on arrival here is 289. Will start SSI-s and monitor CBGs. Check A1c. UA with some glucose, 50. Poorly controlled DM may be contributing to her UTIs.   AKI on CKD IV: Cr today is 2.31. Baseline appears to be 1.7-2. Will give gentle bolus with IVF and monitor.   HFpEF: 2D ECHO from 05/2015 with EF 55-60%, mld LVH. Appears to be on no diuretics  as an outpatient per most recent PCP  note.  Weight appear stable. Appears euvomeic on exam with mild AKI. Daily weights and I&O.  PAF: In NSR today. CHAD-VASc is 6. On warfain outpatient. INR 1.57 today. Warfarin per pharmacy. Heparin SQ until INR therapeutic.   Possible COPD: Appears to have albuterol prn at home. Reports using 3L O2 qhs. No smoking history. Lung clear today. Question true COPD diagnosis. Possible OSA?  Anxiety/Depression: Continue home Lexapro  Hyperlipidemia with Aortic Atherosclerosis and PAD: Continue home Atorvastatin and Fibrate  Code Status: DNR  Dispo: Admit patient to Observation with expected length of stay less than 2 midnights.  Signed: Valentino Nose, MD 02/14/2017, 10:26 AM  Pager: [phone]

## 2017-02-14 NOTE — ED Provider Notes (Signed)
TIME SEEN: 6:07 AM  CHIEF COMPLAINT: Altered mental status, multiple falls, urinary frequency and incontinence  HPI: Pt is a 83 y.o. female with history of possible dementia, diastolic heart failure, COPD on home oxygen, hypertension, diabetes, atrial fibrillation on Coumadin who presents to the emergency department with complaints of urinary frequency, dysuria for the past 2 weeks. Has now been on 2 courses of weeklong antibiotics without relief. Family reports urinary frequency is getting worse and now having some urinary incontinence. They also feeling she has been more confused over the past 24 hours than normal and has had 4 falls in the last several days because of generalized weakness. She does normally ambulate with a walker. They're not sure she hit her head during any of these falls but were concerned because she is on Coumadin. She is complaining of back pain family reports this is chronic. Complaining of bilateral leg pain from her hips to her ankles. She has been able to ambulate but family states she is favoring her right leg.  No chest pain or shortness of breath. No vomiting or diarrhea. She denies numbness or focal weakness.  It appears on review of her records and care everywhere the patient was seen in the clinic on August 21 and diagnosed with UTI and put on a week of doxycycline.  She was then seen again on September 10 and diagnosed with UTI and placed on Macrobid for one week.  PCP - [facility]  ROS: Level V Caveat for AMS  PAST MEDICAL HISTORY/PAST SURGICAL HISTORY:  Past Medical History:  Diagnosis Date  . Anxiety   . Chronic diastolic CHF (congestive heart failure) (HCC)    a. 05/2015 Echo: EF 55-60%, mild LVH, no rwma, mild MR, mildly dil RA, mod TR, PASP .  . CKD (chronic kidney disease), stage IV (HCC)   . Confusion state   . COPD (chronic obstructive pulmonary disease) (HCC)    a. on home O2 @ 3lpm.  . Elevated troponin    a. H/o  elevated trop in setting of CHF; b. 06/2013 Lexiscan MV: nl EF, mild anterior breast attenuation, no ischemia.   . Family history of adverse reaction to anesthesia   . GIB (gastrointestinal bleeding)    a. History of GIB.  Marland Kitchen Hypertensive heart disease   . Macular degeneration, bilateral   . Neuropathy   . Paroxysmal atrial fibrillation (HCC)    a. CHA2DS2VASc = 6-->coumadin.  Marland Kitchen PONV (postoperative nausea and vomiting)   . Type II diabetes mellitus (HCC)   . Varicose veins     MEDICATIONS:  Prior to Admission medications   Medication Sig Start Date End Date Taking? Authorizing Provider  albuterol (PROVENTIL HFA;VENTOLIN HFA) 108 (90 BASE) MCG/ACT inhaler Inhale 2 puffs into the lungs every 6 (six) hours as needed for wheezing or shortness of breath. 06/11/13   Dhungel, Theda Belfast, MD  atorvastatin (LIPITOR) 20 MG tablet Take 20 mg by mouth daily.    [provider]  cholecalciferol (VITAMIN D) 1000 UNITS tablet Take 1,000 Units by mouth every morning.     [provider]  docusate sodium (COLACE) 100 MG capsule Take 100 mg by mouth 2 (two) times daily as needed for mild constipation.    [provider]  escitalopram (LEXAPRO) 10 MG tablet Take 10 mg by mouth daily.    [provider]  fenofibrate 160 MG tablet Take 160 mg by mouth daily.    [provider]  gabapentin (NEURONTIN) 100  MG capsule Take 1 capsule (100 mg total) by mouth at bedtime. 08/05/15   Rodolph Bong, MD  iron polysaccharides (NIFEREX) 150 MG capsule Take 1 capsule (150 mg total) by mouth daily. 09/22/15   Rai, Ripudeep Kirtland Bouchard, MD  isosorbide mononitrate (IMDUR) 30 MG 24 hr tablet TAKE 1 TABLET BY MOUTH EVERY DAY 07/29/16   Hilty, Lisette Abu, MD  levofloxacin (LEVAQUIN) 250 MG tablet Take 1 tablet (250 mg total) by mouth every other day. 03/06/16   Calvert Cantor, MD  metoprolol tartrate (LOPRESSOR) 25 MG tablet TAKE 1 TABLET BY MOUTH TWICE A DAY 11/25/16   Hilty, Lisette Abu, MD  metoprolol  tartrate (LOPRESSOR) 25 MG tablet Take 0.5 tablets (12.5 mg total) by mouth 2 (two) times daily. 12/30/16   Chrystie Nose, MD  Multiple Vitamin (MULTIVITAMIN WITH MINERALS) TABS tablet Take 1 tablet by mouth daily.    [provider]  ondansetron (ZOFRAN) 4 MG tablet Take 1 tablet (4 mg total) by mouth every 6 (six) hours. 08/14/16   Gilda Crease, MD  OXYGEN Inhale 3 L into the lungs at bedtime.     [provider]  polyethylene glycol (MIRALAX / GLYCOLAX) packet Take 17 g by mouth daily as needed for mild constipation.    [provider]  potassium chloride SA (KLOR-CON M20) 20 MEQ tablet Take 1 tablet (20 mEq total) by mouth daily. 05/22/16   Hilty, Lisette Abu, MD  torsemide (DEMADEX) 20 MG tablet Take 2 tablets (40 mg total) by mouth daily. 05/21/16   Chrystie Nose, MD  warfarin (COUMADIN) 4 MG tablet Take 1 to 1.5 tablets daily as directed by coumadin clinic 09/09/16   Chrystie Nose, MD    ALLERGIES:  Allergies  Allergen Reactions  . Yellow Dyes (Non-Tartrazine) Nausea And Vomiting    "deathly allergic" No other information given to explain reaction. Per Daughter- this was an IV dye. Ok with yellow coloring on Coumadin tablets.   . Codeine Hives  . Other Other (See Comments)    novocaine broke mouth out  . Penicillins Hives  . Procaine Hives    SOCIAL HISTORY:  Social History  Substance Use Topics  . Smoking status: Passive Smoke Exposure - Never Smoker  . Smokeless tobacco: Never Used  . Alcohol use No    FAMILY HISTORY: Family History  Problem Relation Age of Onset  . Colon cancer Father 74       advanced when discovered, no surgery or therapy.  this led to his death  . Breast cancer Mother   . Hypertension Mother   . Breast cancer Sister   . Cancer Brother   . Lung cancer Brother   . Thyroid disease Neg Hx   . Heart attack Neg Hx     EXAM: BP (!) 172/63 (BP Location: Right Arm)   Pulse (!) 53   Temp 97.7 F (36.5 C)  (Oral)   Resp 16   Ht  (1.575 m)   Wt 67.1 kg (148 lb)   SpO2 93%   BMI 27.07 kg/m  CONSTITUTIONAL: Alert and oriented to person and place but not time and responds appropriately to questions only intermittently.  HEAD: Normocephalic; atraumatic EYES: Conjunctivae clear, PERRL, EOMI ENT: normal nose; no rhinorrhea; moist mucous membranes; pharynx without lesions noted; no dental injury; no septal hematoma NECK: Supple, no meningismus, no LAD; no midline spinal tenderness, step-off or deformity; trachea midline CARD: RRR; S1 and S2 appreciated; no murmurs, no clicks, no  rubs, no gallops RESP: Normal chest excursion without splinting or tachypnea; breath sounds clear and equal bilaterally; no wheezes, no rhonchi, no rales; no hypoxia or respiratory distress CHEST:  chest wall stable, no crepitus or ecchymosis or deformity, nontender to palpation; no flail chest ABD/GI: Normal bowel sounds; non-distended; soft, non-tender, no rebound, no guarding; no ecchymosis or other lesions noted PELVIS:  stable, nontender to palpation BACK:  The back appears normal and is patient has some mild tenderness over the lower lumbar spine, there is no CVA tenderness; no midline spinal step-off or deformity EXT: patient is tender to palpation from her bilateral hips to her ankles without any obvious deformity. Exam is very limited as patient appears confused.  Normal ROM in all joints; otherwise extremities are non-tender to palpation; no edema; normal capillary refill; no cyanosis, no bony deformity of patient's extremities, no joint effusion, compartments are soft, extremities are warm and well-perfused, no ecchymosis; 2+ radial and DP pulses bilaterally SKIN: Normal color for age and race; warm NEURO: Moves all extremities equally, strength 5/5 in all 4 extremities, sensation to light touch intact diffusely, cranial nerves II through XII intact, normal speech PSYCH: The patient's mood and manner are  appropriate. Grooming and personal hygiene are appropriate.  MEDICAL DECISION MAKING: Pt here with worsening confusion, multiple falls and urinary tract infection that is not improving with outpatient oral antibiotics. Unfortunately both patient and family are very poor historians. Family cannot tell me what medications she has been on for her urinary tract infection. It does appear that she has had an urine culture on September 10 grew Escherichia coli that was patent sensitive but intermediate to cefuroxime.  Urine appears infected today with to numerous to count white blood cells and many bacteria and only 0-5 squamous cells. We have sent another urine culture. She has no leukocytosis, normal lactate and no fever. Family reports that she does not seem to be at her neurologic baseline. She lives at home with her son and uses a walker normally. Her INR today is subtherapeutic. She has no focal neurologic deficits on my examination.We'll obtain CT of her head cervical spine, x-rays of her lumbar spine and bilateral lower extremities. I feel she will need admission. We'll give gentle IV hydration and IV ceftriaxone.  ED PROGRESS: Imaging shows no acute abnormality other than tiny bone densities noted adjacent to the medial tibial spine of the right knee which could represent a tiny fracture fragment but this could also be old.  Will d/w medicine for admission.  7:46 AM Discussed patient's case with internal medicine resident.  I have recommended admission and patient (and family if present) agree with this plan. Admitting physician will place admission orders.   I reviewed all nursing notes, vitals, pertinent previous records, EKGs, lab and urine results, imaging (as available).     Anitria Andon, Layla Maw, DO 02/14/17 [phone]

## 2017-02-14 NOTE — Progress Notes (Signed)
Pharmacy Antibiotic Note  DARREN [name] is a 83 y.o. female admitted on 02/14/2017 with UTI.  Pharmacy has been consulted for ceftriaxone dosing. PCN hives allergy documented but noted patient tolerated 1x dose of ceftriaxone given at 0841 this AM.  Plan: Ceftriaxone 1g IV q24h to start tomorrow Monitor c/s, clinical progress, LOT Not renally adjusted - Rx will s/o consult  Height: 5\' 2"  (157.5 cm) Weight: 148 lb (67.1 kg) IBW/kg (Calculated) : 50.1  Temp (24hrs), Avg:97.9 F (36.6 C), Min:97.7 F (36.5 C), Max:98.1 F (36.7 C)   Recent Labs Lab 02/13/17 2032 02/13/17 2111  WBC 8.1  --   CREATININE 2.31*  --   LATICACIDVEN  --  1.71    Estimated Creatinine Clearance: 16.6 mL/min (A) (by C-G formula based on SCr of 2.31 mg/dL (H)).    Allergies  Allergen Reactions  . Yellow Dyes (Non-Tartrazine) Nausea And Vomiting    "deathly allergic" No other information given to explain reaction. Per Daughter- this was an IV dye. Ok with yellow coloring on Coumadin tablets.   . Codeine Hives  . Other Other (See Comments)    novocaine broke mouth out  . Penicillins Hives  . Procaine Hives    Babs Bertin, PharmD, BCPS Clinical Pharmacist 02/14/2017 10:57 AM

## 2017-02-15 DIAGNOSIS — R296 Repeated falls: Secondary | ICD-10-CM

## 2017-02-15 DIAGNOSIS — R946 Abnormal results of thyroid function studies: Secondary | ICD-10-CM

## 2017-02-15 DIAGNOSIS — M549 Dorsalgia, unspecified: Secondary | ICD-10-CM | POA: Diagnosis not present

## 2017-02-15 DIAGNOSIS — B9620 Unspecified Escherichia coli [E. coli] as the cause of diseases classified elsewhere: Secondary | ICD-10-CM | POA: Diagnosis not present

## 2017-02-15 DIAGNOSIS — J449 Chronic obstructive pulmonary disease, unspecified: Secondary | ICD-10-CM

## 2017-02-15 DIAGNOSIS — Z79899 Other long term (current) drug therapy: Secondary | ICD-10-CM

## 2017-02-15 DIAGNOSIS — Z7901 Long term (current) use of anticoagulants: Secondary | ICD-10-CM

## 2017-02-15 DIAGNOSIS — I700 Atherosclerosis of aorta: Secondary | ICD-10-CM

## 2017-02-15 DIAGNOSIS — R531 Weakness: Secondary | ICD-10-CM | POA: Diagnosis not present

## 2017-02-15 DIAGNOSIS — I130 Hypertensive heart and chronic kidney disease with heart failure and stage 1 through stage 4 chronic kidney disease, or unspecified chronic kidney disease: Secondary | ICD-10-CM | POA: Diagnosis not present

## 2017-02-15 DIAGNOSIS — Z91041 Radiographic dye allergy status: Secondary | ICD-10-CM

## 2017-02-15 DIAGNOSIS — I480 Paroxysmal atrial fibrillation: Secondary | ICD-10-CM

## 2017-02-15 DIAGNOSIS — Z880 Allergy status to penicillin: Secondary | ICD-10-CM

## 2017-02-15 DIAGNOSIS — E785 Hyperlipidemia, unspecified: Secondary | ICD-10-CM

## 2017-02-15 DIAGNOSIS — N390 Urinary tract infection, site not specified: Secondary | ICD-10-CM

## 2017-02-15 DIAGNOSIS — E1151 Type 2 diabetes mellitus with diabetic peripheral angiopathy without gangrene: Secondary | ICD-10-CM | POA: Diagnosis not present

## 2017-02-15 DIAGNOSIS — N179 Acute kidney failure, unspecified: Secondary | ICD-10-CM

## 2017-02-15 DIAGNOSIS — E1142 Type 2 diabetes mellitus with diabetic polyneuropathy: Secondary | ICD-10-CM

## 2017-02-15 DIAGNOSIS — E042 Nontoxic multinodular goiter: Secondary | ICD-10-CM

## 2017-02-15 DIAGNOSIS — E1122 Type 2 diabetes mellitus with diabetic chronic kidney disease: Secondary | ICD-10-CM

## 2017-02-15 DIAGNOSIS — R4182 Altered mental status, unspecified: Secondary | ICD-10-CM | POA: Diagnosis not present

## 2017-02-15 DIAGNOSIS — Z885 Allergy status to narcotic agent status: Secondary | ICD-10-CM

## 2017-02-15 DIAGNOSIS — I5032 Chronic diastolic (congestive) heart failure: Secondary | ICD-10-CM | POA: Diagnosis not present

## 2017-02-15 DIAGNOSIS — Z888 Allergy status to other drugs, medicaments and biological substances status: Secondary | ICD-10-CM

## 2017-02-15 DIAGNOSIS — N184 Chronic kidney disease, stage 4 (severe): Secondary | ICD-10-CM

## 2017-02-15 LAB — BASIC METABOLIC PANEL
Anion gap: 9 (ref 5–15)
BUN: 33 mg/dL — ABNORMAL HIGH (ref 6–20)
CALCIUM: 8.9 mg/dL (ref 8.9–10.3)
CO2: 27 mmol/L (ref 22–32)
CREATININE: 1.78 mg/dL — AB (ref 0.44–1.00)
Chloride: 105 mmol/L (ref 101–111)
GFR calc non Af Amer: 25 mL/min — ABNORMAL LOW (ref >60)
GFR, EST AFRICAN AMERICAN: 29 mL/min — AB (ref >60)
Glucose, Bld: 210 mg/dL — ABNORMAL HIGH (ref 65–99)
Potassium: 3.3 mmol/L — ABNORMAL LOW (ref 3.5–5.1)
SODIUM: 141 mmol/L (ref 135–145)

## 2017-02-15 LAB — TSH: TSH: 7.391 u[IU]/mL — ABNORMAL HIGH (ref 0.350–4.500)

## 2017-02-15 LAB — PROTIME-INR
INR: 1.43
PROTHROMBIN TIME: 17.3 s — AB (ref 11.4–15.2)

## 2017-02-15 LAB — GLUCOSE, CAPILLARY: GLUCOSE-CAPILLARY: 196 mg/dL — AB (ref 65–99)

## 2017-02-15 LAB — URINE CULTURE

## 2017-02-15 MED ORDER — WARFARIN SODIUM 4 MG PO TABS
8.0000 mg | ORAL_TABLET | Freq: Once | ORAL | Status: AC
  Administered 2017-02-15: 8 mg via ORAL
  Filled 2017-02-15: qty 2

## 2017-02-15 MED ORDER — HYDRALAZINE HCL 50 MG PO TABS: 75.0000 mg | ORAL_TABLET | Freq: Two times a day (BID) | ORAL | 0 refills | Status: AC

## 2017-02-15 MED ORDER — CEPHALEXIN 500 MG PO CAPS: 500.0000 mg | ORAL_CAPSULE | Freq: Two times a day (BID) | ORAL | 0 refills | Status: DC

## 2017-02-15 MED ORDER — CEPHALEXIN 500 MG PO CAPS: 500.0000 mg | ORAL_CAPSULE | Freq: Two times a day (BID) | ORAL | Status: DC

## 2017-02-15 MED ORDER — POTASSIUM CHLORIDE CRYS ER 20 MEQ PO TBCR
40.0000 meq | EXTENDED_RELEASE_TABLET | Freq: Once | ORAL | Status: AC
  Administered 2017-02-15: 40 meq via ORAL
  Filled 2017-02-15: qty 2

## 2017-02-15 NOTE — Progress Notes (Signed)
ANTICOAGULATION CONSULT NOTE  Pharmacy Consult for warfarin Indication: atrial fibrillation   Assessment: 77 yof presenting with AMS, multiple falls, urinary frequency/incontinence. On Coumadin 4mg  daily exc for 6mg  on Mon/Fri per last anticoag visit for Afib. INR today is down to 1.43. Coumadin never given last night so will give higher dose this morning. CBC stable.  Goal of Therapy:  INR 2-3 Monitor platelets by anticoagulation protocol: Yes   Plan:  Give Coumadin 8mg  PO x 1 now Stop heparin Cochrane once INR > 2 Monitor daily INR, CBC, s/s of bleed  Enzo Bi, PharmD, BCPS Clinical Pharmacist Pager [phone] 02/15/2017 9:15 AM

## 2017-02-15 NOTE — Evaluation (Signed)
Physical Therapy Evaluation Patient Details Name: Alexis [name] MRN: [id number] DOB: [birth year] Today's Date: 02/15/2017   History of Present Illness  83 y/o female presented with AMS, multiple falls, urinary frequency/incontinence. Pt has a past medical history of Anxiety; Chronic diastolic CHF; CKD; COPD; Hypertensive heart disease; Macular degeneration, bilateral; Neuropathy; Paroxysmal atrial fibrillation; Type II diabetes mellitus; and Varicose veins.  Clinical Impression  Pt presents with dependencies in mobility affecting her Independence secondary to weakness and deconditioning due to UTI. Pt has a history of dementia affecting her safety with mobility. Pt is highly distracted and a fall risk due to decreased balance and cognition. Recommend 24 hour supervision with mobility. Pt's son reports she is a little weaker now. Feel pt will benefit from acute skilled PT to focus on general strengthening, endurance, and safety.    Follow Up Recommendations Home health PT;Supervision/Assistance - 24 hour    Equipment Recommendations  None recommended by PT    Recommendations for Other Services       Precautions / Restrictions Precautions Precautions: Fall Precaution Comments: Pt reports lots of falls recently, including one where she bumped her head Restrictions Weight Bearing Restrictions: No      Mobility  Bed Mobility Overal bed mobility: Needs Assistance Bed Mobility: Sit to Supine       Sit to supine: Min assist;HOB elevated   General bed mobility comments: use of bed rail, increased time required,   Transfers Overall transfer level: Needs assistance Equipment used: Rolling walker (2 wheeled) Transfers: Sit to/from Stand Sit to Stand: Min assist;From elevated surface         General transfer comment: min A for power up initially, but Pt just requires extra time - able to complete with min guard for safety, min assist with toilet  transfer  Ambulation/Gait Ambulation/Gait assistance: Min assist Ambulation Distance (Feet): 110 Feet Assistive device: Rolling walker (2 wheeled) Gait Pattern/deviations: Step-through pattern;Decreased stride length   Gait velocity interpretation: Below normal speed for age/gender General Gait Details: highly distracted, needs verbal cues to avoid obsticles in the hall with RW  Stairs            Wheelchair Mobility    Modified Rankin (Stroke Patients Only)       Balance Overall balance assessment: Needs assistance Sitting-balance support: No upper extremity supported Sitting balance-Leahy Scale: Fair     Standing balance support: Bilateral upper extremity supported Standing balance-Leahy Scale: Poor                               Pertinent Vitals/Pain Pain Assessment: No/denies pain    Home Living Family/patient expects to be discharged to:: Private residence Living Arrangements: Children Available Help at Discharge: Available 24 hours/day Type of Home: House Home Access: Level entry     Home Layout: One level Home Equipment: Environmental consultant - 4 wheels      Prior Function Level of Independence: Independent with assistive device(s)               Hand Dominance        Extremity/Trunk Assessment   Upper Extremity Assessment Upper Extremity Assessment: Defer to OT evaluation    Lower Extremity Assessment Lower Extremity Assessment: RLE deficits/detail RLE Deficits / Details: Previous ORIF, hip strength and ROM limited 2/5       Communication   Communication: No difficulties  Cognition Arousal/Alertness: Awake/alert Behavior During Therapy: WFL for tasks assessed/performed Overall Cognitive Status: History of  cognitive impairments - at baseline                                        General Comments      Exercises     Assessment/Plan    PT Assessment Patient needs continued PT services  PT Problem List  Decreased strength;Decreased activity tolerance;Decreased balance;Decreased mobility;Decreased safety awareness;Decreased range of motion       PT Treatment Interventions DME instruction;Gait training;Functional mobility training;Therapeutic activities;Therapeutic exercise;Balance training;Patient/family education    PT Goals (Current goals can be found in the Care Plan section)  Acute Rehab PT Goals Patient Stated Goal: To keep independent as long as possible PT Goal Formulation: With patient/family Time For Goal Achievement: 03/01/17 Potential to Achieve Goals: Good    Frequency Min 3X/week   Barriers to discharge        Co-evaluation               AM-PAC PT "6 Clicks" Daily Activity  Outcome Measure Difficulty turning over in bed (including adjusting bedclothes, sheets and blankets)?: A Little Difficulty moving from lying on back to sitting on the side of the bed? : A Little Difficulty sitting down on and standing up from a chair with arms (e.g., wheelchair, bedside commode, etc,.)?: A Little Help needed moving to and from a bed to chair (including a wheelchair)?: A Little Help needed walking in hospital room?: A Little Help needed climbing 3-5 steps with a railing? : A Lot 6 Click Score: 17    End of Session Equipment Utilized During Treatment: Gait belt Activity Tolerance: Patient tolerated treatment well Patient left: in bed;with call bell/phone within reach;with bed alarm set;with family/visitor present Nurse Communication: Mobility status PT Visit Diagnosis: Difficulty in walking, not elsewhere classified (R26.2);History of falling (Z91.81)    Time: 1321-1401 PT Time Calculation (min) (ACUTE ONLY): 40 min   Charges:   PT Evaluation $PT Eval Moderate Complexity: 1 Mod PT Treatments $Gait Training: 8-22 mins $Therapeutic Activity: 8-22 mins   PT G Codes:        Lilyan Punt, PT  Greggory Stallion 02/15/2017, 2:02 PM

## 2017-02-15 NOTE — Progress Notes (Signed)
  Date: 02/15/2017  Patient name: Alexis [name]  Medical record number: [id number]  Date of birth: [birth year]   I have seen and evaluated Alexis [name] and discussed their care with the Residency Team. Briefly, Alexis [name] is an 83 yo woman who came in with worsening urinary symptoms and AMS along with falls.  She has been treated for a UTI 2 X in the last month with doxycycline and macrobid.  However, her symptoms of incontinence, frequency and confusion have not appeared to improve very much.  She has had cultures showing pan sensitive E.coli.  She denies fever, chills, nausea, vomiting.  She has generalized weakness in her LE and she has been more confused beyond her baseline.  She has been falling more at home due to this, but this seems to be ongoing for about 3 months.  She has a FH of alzheimers disease.  When we saw her this morning she noted that she had gotten up with help to go to the bathroom and has not had any more incontinence.    Vitals:   02/14/17 2250 02/15/17 0503  BP: (!) 241/96 (!) 169/64  Pulse:  62  Resp:  19  Temp:  98.5 F (36.9 C)  SpO2:  96%   Physical Exam Gen: Pleasant elderly woman, lying in bed, oriented to person and place only HENT: Neck supple Chest: Multiple SK's on chest CV: RR, NR, no murmur Pulm: CTAB, no wheezing or crackles Abd: Soft, NT, ND, +BS Ext: Thin, no edema Skin: Unable to assess back (she stated we "couldn't see it") Psych: Pleasantly demented, mildly confused but conversant.   Pertinent data Na 141 K 3.3 Glu 210 Cr 1.78 (down from 2.31 on admission) INR 1.43 TSH 7.391  UA with TNTC WBC, moderate leuk.  Neg nitrite, many bacteria, 0-5 SEC  CT head and C spine as read by Dr. Margarita Grizzle  IMPRESSION: CT head: Stable atrophy with supratentorial small vessel disease, stable. No intracranial mass, hemorrhage, or extra-axial fluid collection. No evident acute infarct. There are foci of arterial vascular calcification.  There are foci of paranasal sinus disease.  CT cervical spine: No fracture or spondylolisthesis. Mild scoliosis. Multilevel osteoarthritic change noted. No disc extrusion or stenosis.  Evidence of multinodular goiter without dominant thyroid lesion. Foci of carotid artery calcification noted bilaterally.  Assessment and Plan: I have seen and evaluated the patient as outlined above. I agree with the formulated Assessment and Plan as detailed in the residents' note, with the following changes:   1. UTI - Likely undertreated at this point - Start Rocephin q daily - UC pending - Transition to PO Abx when able  2. AMS - Related to infection, vs worsening of underlying dementia - consider performing MOCA  3. Falls - PT/OT evaluation, discuss recommendations with family  4. HTN - Elevated, will increase hydralazine and continue other medications.  She had missed medications on admission.   Other issues per resident daily note.   Inez Catalina, MD 9/22/201812:03 PM

## 2017-02-15 NOTE — Progress Notes (Signed)
Subjective:  No acute events overnight. Patient has poor mentation. Denied dysuria, urinary frequency, and urinary incontinence. Stated that she has not voided while being in the hospital but later reported walking to the bathroom to void. When asked if she had any wounds in her back or buttocks, she said he has but stated we could not see it. Unfortunately, family not present in the room to obtain further history or assess whether patient is at baseline in terms of mental status.  Per RN, patient has an area of erythema between gluteal folds, no skin breakdown, open ulcers, or signs of infection. Lubricating cream placed on lesion.  Objective:  Vital signs in last 24 hours: Vitals:   02/14/17 1801 02/14/17 2207 02/14/17 2250 02/15/17 0503  BP: (!) 188/52 (!) 220/81 (!) 241/96 (!) 169/64  Pulse: 66 96  62  Resp: 18 20  19   Temp: 98.5 F (36.9 C) 98.2 F (36.8 C)  98.5 F (36.9 C)  TempSrc: Oral Oral  Oral  SpO2: 97% 96%  96%  Weight:      Height:       General: pleasant female, well-nourished, well-developed, 80 breakfast in bed in no acute distress Cardiac: regular rate and rhythm, nl S1/S2, II/VI systolic ejection murmur best her on upper right sternal border Pulm: clear anteriorly, no increased work of breathing  Abd: soft, NTND, hyperactive bowel sounds Neuro: A&Ox2, able to move all 4 extremities, no focal deficits noted  Ext: warm and well perfused, no peripheral edema Derm: unable to assess back  as patient said we could not see it    Assessment/Plan:  Alexis [name] is a 83 year old female with history of non-insulin depedent T2DM complicated byperipheral neuropathy, paroxsymal atrial fibrillation on warfarin, HTN, CKD stage IV, HFpEF (EF 55-60%), HLD, and possible COPD presenting with complaints of urinary frequency, incontinence and falls and admitted for UTI associated with altered mental status.   # UTI: Patient with dysuria and incontinence of 1 month  duration. Initially treated with doxycycline at an urgent care 12/2016. She then presented to PCPs office 1-2 weeks ago and treated with Macrobid for ongoing symptoms. Urine cultures have shown pansensitive Escherichia coli x2. She now presents with ongoing urinary symptoms and new altered mental status. UA with moderate leukocytes, many bacteria, no nitrites. Started on ceftriaxone. - CTX 1g x2  - Switch to PO Keflex 500 mg BID x 3 days (9/23-9/25)  # AMS: in the setting of appropriately treated UTI. Family not present at bedside. Unclear what is patient's mentation at baseline, bus suspicion of underlying dementia at baseline.  - UTI treatment as above   # AKI on CKD IV:  Baseline appears to be 1.7-2. Renal function improving, Cr 1.78 today. - Will continue to monitor   # Low TSH: TSH 7 in the setting of acute illness.  - Outpatient follow-up   # Falls: Per family, uses a walker at baseline and having difficulty performing ADLs.  - PT evaluation pending - OT recommended home health OT   # HTN: Patient persistently hypertensive on admission with systolic blood pressures between 160-240s. Given IV labetalol 51 overnight with improvement in BP. She's currently on home meds including metoprolol  12.5 MG BID, Imdur 30 mg QD< anc hydralazine 50 MG BID.  - Continue home metoprolol 12.5 mg BID and Imdur 30 mg QD  - Will increase hydralazine 50 --> 75 mg BID   # Paroxysmal atrial fibrillation: Currently NSR. On coumadin at home.  INR 1.43 - SQ heparin due to subtherapeutic INR  - Continue home coumadin    # HFpEF: 2D ECHO from 05/2015 with EF 55-60%, mld LVH. Appears to be on no diuretics as an outpatient per most recent PCP note.  Weight appear stable. Appears euvomeic on exam with mild AKI.  - Daily weights and I&Os   # T2DM: well controlled. A1c 8.4 01/2017.  - SSI-S - CBG monitoring   # Possible COPD: Appears to have albuterol prn at home. Reports using 3L O2 qhs. No smoking history. Lung  clear today. Question true COPD diagnosis. Possible OSA?  # Anxiety/Depression: - Continue home Lexapro  # Hyperlipidemia with Aortic Atherosclerosis and PAD: - Continue home Atorvastatin and Fibrate  F: none  E: we'll monitor and replete as needed N: CM diet   VTE ppx: SQ heparin due to subtherapeutic INR   Code stats: Full code,confirmed   Dispo: Anticipated discharge in approximately 1-2 day(s).   Burna Cash, MD  Internal Medicine PGY-1  P [phone]

## 2017-02-15 NOTE — Progress Notes (Signed)
Case management called about setting up PT/OT and HH. Awaiting response.

## 2017-02-15 NOTE — Discharge Summary (Signed)
Name: Alexis [name] MRN: [id number] DOB: 12/12/1931 83 y.o. PCP: [name], PA-C  Date of Admission: 02/14/2017  5:08 AM Date of Discharge: 02/15/2017 Attending Physician: Inez Catalina, MD  Discharge Diagnosis: 1. Urinary tract infection   Principal Problem:   UTI (urinary tract infection) Active Problems:   Essential hypertension   (HFpEF) heart failure with preserved ejection fraction (HCC)   COPD (chronic obstructive pulmonary disease) (HCC)   Paroxysmal atrial fibrillation (HCC)   Long term current use of anticoagulant therapy   Type II diabetes mellitus (HCC)   Generalized weakness   HLD (hyperlipidemia)   AKI (acute kidney injury) (HCC)   CKD (chronic kidney disease), stage IV (HCC)   Frequent falls   Aortic atherosclerosis (HCC)   Peripheral vascular disease (HCC)   Discharge Medications: Allergies as of 02/15/2017      Reactions   Yellow Dyes (non-tartrazine) Nausea And Vomiting   "deathly allergic" No other information given to explain reaction. Per Daughter- this was an IV dye. Ok with yellow coloring on Coumadin tablets.    Codeine Hives   Other Other (See Comments)   novocaine broke mouth out   Penicillins Hives   Procaine Hives      Medication List    STOP taking these medications   levofloxacin 250 MG tablet Commonly known as:  LEVAQUIN     TAKE these medications   albuterol 108 (90 Base) MCG/ACT inhaler Commonly known as:  PROVENTIL HFA;VENTOLIN HFA Inhale 2 puffs into the lungs every 6 (six) hours as needed for wheezing or shortness of breath.   atorvastatin 20 MG tablet Commonly known as:  LIPITOR Take 20 mg by mouth daily.   cephALEXin 500 MG capsule Commonly known as:  KEFLEX Take 1 capsule (500 mg total) by mouth every 12 (twelve) hours. Starting 9/23   cholecalciferol 1000 units tablet Commonly known as:  VITAMIN D Take 1,000 Units by mouth every morning.   docusate sodium 100 MG capsule Commonly known as:   COLACE Take 100 mg by mouth 2 (two) times daily as needed for mild constipation.   escitalopram 10 MG tablet Commonly known as:  LEXAPRO Take 10 mg by mouth daily.   fenofibrate 160 MG tablet Take 160 mg by mouth daily.   gabapentin 100 MG capsule Commonly known as:  NEURONTIN Take 1 capsule (100 mg total) by mouth at bedtime.   hydrALAZINE 50 MG tablet Commonly known as:  APRESOLINE Take 1.5 tablets (75 mg total) by mouth 2 (two) times daily.   iron polysaccharides 150 MG capsule Commonly known as:  NIFEREX Take 1 capsule (150 mg total) by mouth daily.   isosorbide mononitrate 30 MG 24 hr tablet Commonly known as:  IMDUR TAKE 1 TABLET BY MOUTH EVERY DAY   metoprolol tartrate 25 MG tablet Commonly known as:  LOPRESSOR Take 0.5 tablets (12.5 mg total) by mouth 2 (two) times daily. What changed:  Another medication with the same name was removed. Continue taking this medication, and follow the directions you see here.   multivitamin with minerals Tabs tablet Take 1 tablet by mouth daily.   ondansetron 4 MG tablet Commonly known as:  ZOFRAN Take 1 tablet (4 mg total) by mouth every 6 (six) hours.   OXYGEN Inhale 3 L into the lungs at bedtime.   polyethylene glycol packet Commonly known as:  MIRALAX / GLYCOLAX Take 17 g by mouth daily as needed for mild constipation.   potassium chloride SA 20 MEQ tablet Commonly  known as:  KLOR-CON M20 Take 1 tablet (20 mEq total) by mouth daily.   torsemide 20 MG tablet Commonly known as:  DEMADEX Take 2 tablets (40 mg total) by mouth daily.   warfarin 4 MG tablet Commonly known as:  COUMADIN Take 1 to 1.5 tablets daily as directed by coumadin clinic            Discharge Care Instructions        Start     Ordered   02/16/17 0000  cephALEXin (KEFLEX) 500 MG capsule  Every 12 hours     02/15/17 1458   02/15/17 0000  hydrALAZINE (APRESOLINE) 50 MG tablet  2 times daily     02/15/17 1458   02/15/17 0000  Increase  activity slowly     02/15/17 1458   02/15/17 0000  Diet - low sodium heart healthy     02/15/17 1458      Disposition and follow-up:   Alexis [name] was discharged from [facility] in Stable condition.  At the hospital follow up visit please address:  1.  Please assess ongoing symptoms of UTI: urinary frequency, urgency and incontinence.   2. Please assess if mentation is back to baseline. She might need evaluation for dementia.   3. Please assess blood pressure. Patient might need uptitration/addition of medications for better blood pressure control.   4. Please address compliance with warfarin and check INR. Subtherapeutic during admission.   2.  Labs / imaging needed at time of follow-up: INR  3.  Pending labs/ test needing follow-up: None   Follow-up Appointments: Follow-up Information    [name], PA-C. Schedule an appointment as soon as possible for a visit in 1 week(s).   Specialty:  Physician Assistant Why:  Please call to make appt as they are closed today Contact information: [address] [place] [address] [phone]        [facility] Follow up.   Why:  Home Health Physical Therapy- agency will call to arrange initial appt Contact information: [address] [place] [address] [phone]           Hospital Course by problem list:   Alexis [name] is a 83 year old female with history of non-insulin depedent T2DM complicated by peripheral neuropathy, paroxsymal atrial fibrillation on warfarin, HTN, CKD stage IV, HFpEF (EF 55-60%), HLD, and possible COPD presenting with complaints of urinary frequency, incontinence and falls and admitted for UTI associated with altered mental status.   1. UTI: Patient presented with dysuria and urinary frequency and incontinence of 1 month duration. Initially treated with doxycycline at an urgent care 12/2016. She then presented to PCPs office  1-2 weeks ago and treated with Macrobid for ongoing symptoms. Urine cultures have shown pansensitive Escherichia coli x2. On admission, UA with moderate leukocytes, many bacteria, no nitrites. She was started on ceftriaxone and received 2 doses. She was discharged on Keflex 500 mg BID x 3 days (9/23-9/25)  2. AMS: Per family, patient seemed to have AMS on presentation. No electrolytes abnormalities seen and head CT negative. Unclear if AMS secondary to UTI vs underlying dementia. Also, unclear what is patient's mentation at baseline.   3. Low TSH: Patient was found to have multinodular goiter on C-spine CT. TSH 7. Recommended follow up with PCP.   4. Falls: Patient complaining of worsening back pain and lower extremity weakness. Per family, uses a walker at baseline and has been having difficulty  performing ADLs as well as falling frequently. Lower extremity XRs negative for fractures. PT and OT evaluation recommended home health PT and OT, which was ordered prior to discharge.    5. HTN: Patient persistently hypertensive on admission with systolic blood pressures between 160-240s. Home medications were continued during admission and\ Hydralazine was increased to 75 mg BID.   6. Paroxysmal atrial fibrillation: NSR during admission. On coumadin at home but found to have a subtherapeutic INR. She was started on SQ heparin for VTE ppx. Recommended INR check with PCP.     Discharge Vitals:   BP (!) 138/42 (BP Location: Right Arm)   Pulse 61   Temp 98.4 F (36.9 C) (Oral)   Resp 18   Ht 5\' 2"  (1.575 m)   Wt 148 lb (67.1 kg)   SpO2 94%   BMI 27.07 kg/m   Pertinent Labs, Studies, and Procedures:  1. Head + C-spine CT: IMPRESSION: CT head: Stable atrophy with supratentorial small vessel disease, stable. No intracranial mass, hemorrhage, or extra-axial fluid collection. No evident acute infarct. There are foci of arterial vascular calcification. There are foci of paranasal sinus  disease.  CT cervical spine: No fracture or spondylolisthesis. Mild scoliosis. Multilevel osteoarthritic change noted. No disc extrusion or Stenosis.  2. XR lumbar spine: IMPRESSION: Multilevel osteoarthritic change.  No fracture or spondylolisthesis. Aortic atherosclerosis.  3. XR Hips bilateral: IMPRESSION: 1. Degenerative changes lumbar spine and both hips. Diffuse osteopenia. No acute bony abnormality. 2.  Plate and screw fixation of the right hip.   Discharge Instructions: Discharge Instructions    Diet - low sodium heart healthy    Complete by:  As directed    Increase activity slowly    Complete by:  As directed       Signed: Burna Cash, MD  Internal Medicine PGY-1  P [phone]

## 2017-02-15 NOTE — Discharge Instructions (Addendum)
Please finish your antibiotics Please follow up in coumadin clinic and with your PCP We have ordered home health physical therapy.    Urinary Tract Infection, Adult A urinary tract infection (UTI) is an infection of any part of the urinary tract. The urinary tract includes the:  Kidneys.  Ureters.  Bladder.  Urethra.  These organs make, store, and get rid of pee (urine) in the body. Follow these instructions at home:  Take over-the-counter and prescription medicines only as told by your doctor.  If you were prescribed an antibiotic medicine, take it as told by your doctor. Do not stop taking the antibiotic even if you start to feel better.  Avoid the following drinks: ? Alcohol. ? Caffeine. ? Tea. ? Carbonated drinks.  Drink enough fluid to keep your pee clear or pale yellow.  Keep all follow-up visits as told by your doctor. This is important.  Make sure to: ? Empty your bladder often and completely. Do not to hold pee for long periods of time. ? Empty your bladder before and after sex. ? Wipe from front to back after a bowel movement if you are female. Use each tissue one time when you wipe. Contact a doctor if:  You have back pain.  You have a fever.  You feel sick to your stomach (nauseous).  You throw up (vomit).  Your symptoms do not get better after 3 days.  Your symptoms go away and then come back. Get help right away if:  You have very bad back pain.  You have very bad lower belly (abdominal) pain.  You are throwing up and cannot keep down any medicines or water. This information is not intended to replace advice given to you by your health care provider. Make sure you discuss any questions you have with your health care provider. Document Released: 10/30/2007 Document Revised: 10/19/2015 Document Reviewed: 04/03/2015 Elsevier Interactive Patient Education  [employer].

## 2017-02-15 NOTE — Care Management Note (Signed)
Case Management Note  Patient Details  Name: Alexis [name] MRN: [id number] Date of Birth: 1932-03-26  Subjective/Objective:    UTI, AMS, falls                Action/Plan: Discharge Planning: AVS reviewed NCM spoke to dtr, [employer]. States pt lives in home with son, Onalee Hua # [phone]. States she has RW and bedside commode at home. Offered choice for [facility]. Per dtr, pt had [facility] in the past. They are requesting [facility] for [facility]. NCM contacted [facility] with new referral.   PCP  [name] MD  Expected Discharge Date:  02/15/17               Expected Discharge Plan:  Home w Home Health Services  In-House Referral:  NA  Discharge planning Services  CM Consult  Post Acute Care Choice:  NA Choice offered to:  Patient  DME Arranged:  N/A DME Agency:  NA  HH Arranged:  PT, OT HH Agency:  [facility]  Status of Service:  Completed, signed off  If discussed at Long Length of Stay Meetings, dates discussed:    Additional Comments:  Elliot Cousin, RN 02/15/2017, 5:26 PM

## 2017-02-15 NOTE — Evaluation (Signed)
Occupational Therapy Evaluation Patient Details Name: Alexis [name] MRN: [id number] DOB: 18-Oct-1931 Today's Date: 02/15/2017    History of Present Illness 83 y/o female presented with AMS, multiple falls, urinary frequency/incontinence. Pt has a past medical history of Anxiety; Chronic diastolic CHF; CKD; COPD; Hypertensive heart disease; Macular degeneration, bilateral; Neuropathy; Paroxysmal atrial fibrillation; Type II diabetes mellitus; and Varicose veins.   Clinical Impression   PTA Pt modified independent in ADL/IADL and ambulating with Rollator. Pt lives with her adult son who is home most of the time and has daughters who live close by. Pt shared that she has fallen multiple times recently (one where she bumped her head). Pt is currently min guard assist for ADL and min guard for short mobility in room with RW. Pt will benefit from skilled OT in the acute setting and HHOT to follow up for falls assessment and to maximize safety and independence in ADL/IADL and transfers in her home environment. Next session to focus on standing balance as applied to grooming tasks or cooking.    Follow Up Recommendations  Home health OT;Supervision/Assistance - 24 hour    Equipment Recommendations  None recommended by OT (Pt has appropriate DME)    Recommendations for Other Services       Precautions / Restrictions Precautions Precautions: Fall Precaution Comments: Pt reports lots of falls recently, including one where she bumped her head Restrictions Weight Bearing Restrictions: No      Mobility Bed Mobility Overal bed mobility: Needs Assistance Bed Mobility: Supine to Sit     Supine to sit: Min assist     General bed mobility comments: use of bed rail, increased time required, assist to bring hips to EOB  Transfers Overall transfer level: Needs assistance Equipment used: Rolling walker (2 wheeled) Transfers: Sit to/from [employer] Sit to Stand: Min  assist;From elevated surface Stand pivot transfers: Min guard;From elevated surface       General transfer comment: min A for power up initially, but Pt just requires extra time - able to complete with min guard for safety    Balance Overall balance assessment: History of Falls;Needs assistance Sitting-balance support: Single extremity supported;Feet supported Sitting balance-Leahy Scale: Fair Sitting balance - Comments: sittng EOB with no back support   Standing balance support: Bilateral upper extremity supported;During functional activity Standing balance-Leahy Scale: Poor Standing balance comment: relies on external support from walker during dynamic movement                           ADL either performed or assessed with clinical judgement   ADL Overall ADL's : Needs assistance/impaired Eating/Feeding: Modified independent;Sitting Eating/Feeding Details (indicate cue type and reason): finishing up breakfast when OT entered Grooming: Set up;Sitting   Upper Body Bathing: Supervision/ safety;Sitting   Lower Body Bathing: Supervison/ safety;Sitting/lateral leans Lower Body Bathing Details (indicate cue type and reason): educated on long handle sponge as option Upper Body Dressing : Set up;Sitting   Lower Body Dressing: Min guard;Sit to/from stand Lower Body Dressing Details (indicate cue type and reason): "sometimes my son helps me with my socks if I need it" Toilet Transfer: Min guard;Ambulation;RW;BSC Toilet Transfer Details (indicate cue type and reason): vc for hande placement for safety Toileting- Clothing Manipulation and Hygiene: Min guard;Sit to/from stand Toileting - Clothing Manipulation Details (indicate cue type and reason): hospital gown and peri care - OT applied barrier cream due to pain and sore spot Tub/ Shower Transfer: Walk-in shower;Min  guard;Ambulation;Rolling walker;Shower seat   Functional mobility during ADLs: Min guard;Rolling walker (close  min guard for safety) General ADL Comments: Pt requires extra time, believe this is close to baseline     Vision Patient Visual Report: No change from baseline       Perception     Praxis      Pertinent Vitals/Pain Pain Assessment: 0-10 Pain Score: 2  Pain Location: bottom Pain Descriptors / Indicators: Discomfort Pain Intervention(s): Other (comment);Repositioned (barrier cream applied)     Hand Dominance Right   Extremity/Trunk Assessment Upper Extremity Assessment Upper Extremity Assessment: Generalized weakness   Lower Extremity Assessment Lower Extremity Assessment: Defer to PT evaluation   Cervical / Trunk Assessment Cervical / Trunk Assessment: Kyphotic   Communication Communication Communication: No difficulties   Cognition Arousal/Alertness: Awake/alert Behavior During Therapy: WFL for tasks assessed/performed Overall Cognitive Status: History of cognitive impairments - at baseline                                 General Comments: trouble recalling information "I have a hard time remembering these days it's like a know what I want to say but can't quite figure it out"   General Comments  Pt very pleasant and cooperative with therapy    Exercises     Shoulder Instructions      Home Living Family/patient expects to be discharged to:: Private residence Living Arrangements: Children (son - daughters live close by too) Available Help at Discharge: Family;Available 24 hours/day Type of Home: House       Home Layout: One level     Bathroom Shower/Tub: Producer, television/film/video: Handicapped height Bathroom Accessibility: Yes How Accessible: Accessible via walker Home Equipment: Walker - 4 wheels;Bedside commode;Shower seat;Hand held shower head          Prior Functioning/Environment Level of Independence: Independent with assistive device(s)        Comments: used rollator for ambulation in home, no driving        OT  Problem List: Impaired balance (sitting and/or standing);Decreased cognition      OT Treatment/Interventions:      OT Goals(Current goals can be found in the care plan section) Acute Rehab OT Goals Patient Stated Goal: To keep independent as long as possible OT Goal Formulation: With patient Time For Goal Achievement: 02/23/17 Potential to Achieve Goals: Good ADL Goals Pt Will Perform Grooming: with modified independence;standing Pt Will Perform Upper Body Bathing: with modified independence;sitting Pt Will Perform Lower Body Bathing: with modified independence;sitting/lateral leans;with adaptive equipment Pt Will Transfer to Toilet: with modified independence;ambulating;regular height toilet (with Rollator) Pt Will Perform Toileting - Clothing Manipulation and hygiene: with modified independence;sit to/from stand  OT Frequency:     Barriers to D/C:            Co-evaluation              AM-PAC PT "6 Clicks" Daily Activity     Outcome Measure Help from another person eating meals?: None Help from another person taking care of personal grooming?: A Little Help from another person toileting, which includes using toliet, bedpan, or urinal?: A Little Help from another person bathing (including washing, rinsing, drying)?: None Help from another person to put on and taking off regular upper body clothing?: None Help from another person to put on and taking off regular lower body clothing?: A Little 6 Click Score: 21  End of Session Equipment Utilized During Treatment: Gait belt;Rolling walker Nurse Communication: Mobility status;Other (comment) (barrier cream applied)  Activity Tolerance: Patient tolerated treatment well Patient left: in chair;with call bell/phone within reach;with chair alarm set  OT Visit Diagnosis: Unsteadiness on feet (R26.81);Other abnormalities of gait and mobility (R26.89);Repeated falls (R29.6);History of falling (Z91.81)                Time:  0981-1914 OT Time Calculation (min): 39 min Charges:  OT General Charges $OT Visit: 1 Visit OT Evaluation $OT Eval Low Complexity: 1 Low OT Treatments $Self Care/Home Management : 8-22 mins G-Codes: OT G-codes **NOT FOR INPATIENT CLASS** Functional Assessment Tool Used: AM-PAC 6 Clicks Daily Activity Functional Limitation: Self care Self Care Current Status (N8295): At least 20 percent but less than 40 percent impaired, limited or restricted Self Care Goal Status (A2130): At least 1 percent but less than 20 percent impaired, limited or restricted   Sherryl Manges OTR/L [phone]  Alexis [name] 02/15/2017, 11:29 AM

## 2017-02-17 ENCOUNTER — Telehealth: Payer: Self-pay | Admitting: Internal Medicine

## 2017-02-17 LAB — GLUCOSE, CAPILLARY
GLUCOSE-CAPILLARY: 247 mg/dL — AB (ref 65–99)
GLUCOSE-CAPILLARY: 147 mg/dL — AB (ref 65–99)

## 2017-02-17 NOTE — Telephone Encounter (Signed)
New message    Pt daughter is calling asking for a call back. She said pt just got out of the hospital and her medications were changed. Please call.

## 2017-02-17 NOTE — Telephone Encounter (Signed)
Left a message to call back, per dpr 

## 2017-02-18 NOTE — Telephone Encounter (Signed)
Left message for pt dtr to call  

## 2017-02-20 ENCOUNTER — Ambulatory Visit (INDEPENDENT_AMBULATORY_CARE_PROVIDER_SITE_OTHER): Payer: Medicare Other | Admitting: Pharmacist

## 2017-02-20 DIAGNOSIS — Z7901 Long term (current) use of anticoagulants: Secondary | ICD-10-CM | POA: Diagnosis not present

## 2017-02-20 DIAGNOSIS — I481 Persistent atrial fibrillation: Secondary | ICD-10-CM

## 2017-02-20 DIAGNOSIS — I480 Paroxysmal atrial fibrillation: Secondary | ICD-10-CM

## 2017-02-20 DIAGNOSIS — I4819 Other persistent atrial fibrillation: Secondary | ICD-10-CM

## 2017-02-20 LAB — POCT INR: INR: 2.6

## 2017-02-20 NOTE — Telephone Encounter (Signed)
Left message to call back, per dpr. 

## 2017-02-25 NOTE — Telephone Encounter (Signed)
Unable to contact pt tried x3 will await call back

## 2017-03-07 ENCOUNTER — Telehealth: Payer: Self-pay | Admitting: Pharmacist Clinician (PhC)/ Clinical Pharmacy Specialist

## 2017-03-07 ENCOUNTER — Ambulatory Visit (INDEPENDENT_AMBULATORY_CARE_PROVIDER_SITE_OTHER): Payer: Medicare Other | Admitting: Pharmacist Clinician (PhC)/ Clinical Pharmacy Specialist

## 2017-03-07 DIAGNOSIS — I481 Persistent atrial fibrillation: Secondary | ICD-10-CM | POA: Diagnosis not present

## 2017-03-07 DIAGNOSIS — Z7901 Long term (current) use of anticoagulants: Secondary | ICD-10-CM

## 2017-03-07 DIAGNOSIS — I480 Paroxysmal atrial fibrillation: Secondary | ICD-10-CM

## 2017-03-07 DIAGNOSIS — I4819 Other persistent atrial fibrillation: Secondary | ICD-10-CM

## 2017-03-07 LAB — POCT INR: INR: 3.6

## 2017-03-07 NOTE — Telephone Encounter (Signed)
Patient in office today for INR, was asked to check her BP by MD as well.  BP today was 164/60 in the office.  Daughter is not sure about which medications she takes for BP or what doses are.  Apparently patient lives with son, and his wife is the one who organizes medications.  Will not make any changes to meds at this time.  Highlighted in AVS her hydralazine 75 mg bid and metoprolol 12.5 mg bid.  Asked that they be sure she is taking these correctly and we can further assess her BP needs at her next INR appointment.    Patient and daughter voiced understanding.

## 2017-03-13 ENCOUNTER — Other Ambulatory Visit: Payer: Self-pay | Admitting: Internal Medicine

## 2017-03-14 ENCOUNTER — Other Ambulatory Visit: Payer: Self-pay | Admitting: Internal Medicine

## 2017-03-20 ENCOUNTER — Emergency Department (HOSPITAL_COMMUNITY): Payer: Medicare Other

## 2017-03-20 ENCOUNTER — Encounter (HOSPITAL_COMMUNITY): Payer: Self-pay | Admitting: Emergency Medicine

## 2017-03-20 ENCOUNTER — Inpatient Hospital Stay (HOSPITAL_COMMUNITY)
Admission: EM | Admit: 2017-03-20 | Discharge: 2017-04-08 | DRG: 291 | Disposition: A | Discharge status: Skilled Nursing Facility | Payer: Medicare Other | Attending: Internal Medicine | Admitting: Internal Medicine

## 2017-03-20 DIAGNOSIS — D649 Anemia, unspecified: Secondary | ICD-10-CM | POA: Diagnosis not present

## 2017-03-20 DIAGNOSIS — E875 Hyperkalemia: Secondary | ICD-10-CM | POA: Diagnosis present

## 2017-03-20 DIAGNOSIS — Z91041 Radiographic dye allergy status: Secondary | ICD-10-CM

## 2017-03-20 DIAGNOSIS — Z66 Do not resuscitate: Secondary | ICD-10-CM | POA: Diagnosis present

## 2017-03-20 DIAGNOSIS — J440 Chronic obstructive pulmonary disease with acute lower respiratory infection: Secondary | ICD-10-CM | POA: Diagnosis present

## 2017-03-20 DIAGNOSIS — T3695XA Adverse effect of unspecified systemic antibiotic, initial encounter: Secondary | ICD-10-CM | POA: Diagnosis not present

## 2017-03-20 DIAGNOSIS — I350 Nonrheumatic aortic (valve) stenosis: Secondary | ICD-10-CM | POA: Diagnosis present

## 2017-03-20 DIAGNOSIS — R1310 Dysphagia, unspecified: Secondary | ICD-10-CM | POA: Diagnosis not present

## 2017-03-20 DIAGNOSIS — I5030 Unspecified diastolic (congestive) heart failure: Secondary | ICD-10-CM | POA: Diagnosis present

## 2017-03-20 DIAGNOSIS — R609 Edema, unspecified: Secondary | ICD-10-CM

## 2017-03-20 DIAGNOSIS — I2721 Secondary pulmonary arterial hypertension: Secondary | ICD-10-CM | POA: Diagnosis not present

## 2017-03-20 DIAGNOSIS — I130 Hypertensive heart and chronic kidney disease with heart failure and stage 1 through stage 4 chronic kidney disease, or unspecified chronic kidney disease: Principal | ICD-10-CM | POA: Diagnosis present

## 2017-03-20 DIAGNOSIS — B9620 Unspecified Escherichia coli [E. coli] as the cause of diseases classified elsewhere: Secondary | ICD-10-CM | POA: Diagnosis present

## 2017-03-20 DIAGNOSIS — J439 Emphysema, unspecified: Secondary | ICD-10-CM | POA: Diagnosis not present

## 2017-03-20 DIAGNOSIS — J441 Chronic obstructive pulmonary disease with (acute) exacerbation: Secondary | ICD-10-CM | POA: Diagnosis not present

## 2017-03-20 DIAGNOSIS — I340 Nonrheumatic mitral (valve) insufficiency: Secondary | ICD-10-CM | POA: Diagnosis present

## 2017-03-20 DIAGNOSIS — N179 Acute kidney failure, unspecified: Secondary | ICD-10-CM | POA: Diagnosis present

## 2017-03-20 DIAGNOSIS — I480 Paroxysmal atrial fibrillation: Secondary | ICD-10-CM | POA: Diagnosis present

## 2017-03-20 DIAGNOSIS — N3090 Cystitis, unspecified without hematuria: Secondary | ICD-10-CM | POA: Diagnosis present

## 2017-03-20 DIAGNOSIS — I481 Persistent atrial fibrillation: Secondary | ICD-10-CM | POA: Diagnosis present

## 2017-03-20 DIAGNOSIS — F419 Anxiety disorder, unspecified: Secondary | ICD-10-CM | POA: Diagnosis present

## 2017-03-20 DIAGNOSIS — J449 Chronic obstructive pulmonary disease, unspecified: Secondary | ICD-10-CM | POA: Diagnosis present

## 2017-03-20 DIAGNOSIS — J9611 Chronic respiratory failure with hypoxia: Secondary | ICD-10-CM | POA: Diagnosis not present

## 2017-03-20 DIAGNOSIS — R0602 Shortness of breath: Secondary | ICD-10-CM

## 2017-03-20 DIAGNOSIS — Z885 Allergy status to narcotic agent status: Secondary | ICD-10-CM

## 2017-03-20 DIAGNOSIS — N390 Urinary tract infection, site not specified: Secondary | ICD-10-CM | POA: Diagnosis not present

## 2017-03-20 DIAGNOSIS — R531 Weakness: Secondary | ICD-10-CM | POA: Diagnosis not present

## 2017-03-20 DIAGNOSIS — J189 Pneumonia, unspecified organism: Secondary | ICD-10-CM | POA: Diagnosis present

## 2017-03-20 DIAGNOSIS — Z880 Allergy status to penicillin: Secondary | ICD-10-CM

## 2017-03-20 DIAGNOSIS — E1151 Type 2 diabetes mellitus with diabetic peripheral angiopathy without gangrene: Secondary | ICD-10-CM | POA: Diagnosis present

## 2017-03-20 DIAGNOSIS — Z888 Allergy status to other drugs, medicaments and biological substances status: Secondary | ICD-10-CM

## 2017-03-20 DIAGNOSIS — Z7722 Contact with and (suspected) exposure to environmental tobacco smoke (acute) (chronic): Secondary | ICD-10-CM | POA: Diagnosis present

## 2017-03-20 DIAGNOSIS — D631 Anemia in chronic kidney disease: Secondary | ICD-10-CM | POA: Diagnosis present

## 2017-03-20 DIAGNOSIS — N183 Chronic kidney disease, stage 3 unspecified: Secondary | ICD-10-CM

## 2017-03-20 DIAGNOSIS — N184 Chronic kidney disease, stage 4 (severe): Secondary | ICD-10-CM | POA: Diagnosis present

## 2017-03-20 DIAGNOSIS — E1165 Type 2 diabetes mellitus with hyperglycemia: Secondary | ICD-10-CM | POA: Diagnosis not present

## 2017-03-20 DIAGNOSIS — I5040 Unspecified combined systolic (congestive) and diastolic (congestive) heart failure: Secondary | ICD-10-CM

## 2017-03-20 DIAGNOSIS — I5033 Acute on chronic diastolic (congestive) heart failure: Secondary | ICD-10-CM | POA: Diagnosis not present

## 2017-03-20 DIAGNOSIS — R195 Other fecal abnormalities: Secondary | ICD-10-CM | POA: Diagnosis present

## 2017-03-20 DIAGNOSIS — R0600 Dyspnea, unspecified: Secondary | ICD-10-CM

## 2017-03-20 DIAGNOSIS — Z515 Encounter for palliative care: Secondary | ICD-10-CM | POA: Diagnosis not present

## 2017-03-20 DIAGNOSIS — J9621 Acute and chronic respiratory failure with hypoxia: Secondary | ICD-10-CM | POA: Diagnosis not present

## 2017-03-20 DIAGNOSIS — Z23 Encounter for immunization: Secondary | ICD-10-CM

## 2017-03-20 DIAGNOSIS — I509 Heart failure, unspecified: Secondary | ICD-10-CM | POA: Diagnosis not present

## 2017-03-20 DIAGNOSIS — Z7901 Long term (current) use of anticoagulants: Secondary | ICD-10-CM | POA: Diagnosis not present

## 2017-03-20 DIAGNOSIS — R001 Bradycardia, unspecified: Secondary | ICD-10-CM | POA: Diagnosis not present

## 2017-03-20 DIAGNOSIS — E119 Type 2 diabetes mellitus without complications: Secondary | ICD-10-CM

## 2017-03-20 DIAGNOSIS — E1122 Type 2 diabetes mellitus with diabetic chronic kidney disease: Secondary | ICD-10-CM | POA: Diagnosis present

## 2017-03-20 DIAGNOSIS — R791 Abnormal coagulation profile: Secondary | ICD-10-CM | POA: Diagnosis present

## 2017-03-20 DIAGNOSIS — I252 Old myocardial infarction: Secondary | ICD-10-CM

## 2017-03-20 DIAGNOSIS — T501X5A Adverse effect of loop [high-ceiling] diuretics, initial encounter: Secondary | ICD-10-CM | POA: Diagnosis not present

## 2017-03-20 DIAGNOSIS — Z9981 Dependence on supplemental oxygen: Secondary | ICD-10-CM

## 2017-03-20 DIAGNOSIS — H3530 Unspecified macular degeneration: Secondary | ICD-10-CM | POA: Diagnosis present

## 2017-03-20 DIAGNOSIS — Z794 Long term (current) use of insulin: Secondary | ICD-10-CM

## 2017-03-20 HISTORY — DX: Acute myocardial infarction, unspecified: I21.9

## 2017-03-20 LAB — BASIC METABOLIC PANEL
ANION GAP: 10 (ref 5–15)
BUN: 38 mg/dL — ABNORMAL HIGH (ref 6–20)
CHLORIDE: 104 mmol/L (ref 101–111)
CO2: 27 mmol/L (ref 22–32)
Calcium: 8.8 mg/dL — ABNORMAL LOW (ref 8.9–10.3)
Creatinine, Ser: 1.93 mg/dL — ABNORMAL HIGH (ref 0.44–1.00)
GFR calc non Af Amer: 23 mL/min — ABNORMAL LOW (ref >60)
GFR, EST AFRICAN AMERICAN: 27 mL/min — AB (ref >60)
Glucose, Bld: 205 mg/dL — ABNORMAL HIGH (ref 65–99)
POTASSIUM: 4.2 mmol/L (ref 3.5–5.1)
SODIUM: 141 mmol/L (ref 135–145)

## 2017-03-20 LAB — URINALYSIS, ROUTINE W REFLEX MICROSCOPIC
Bilirubin Urine: NEGATIVE
GLUCOSE, UA: NEGATIVE mg/dL
HGB URINE DIPSTICK: NEGATIVE
KETONES UR: NEGATIVE mg/dL
Nitrite: NEGATIVE
PH: 7 (ref 5.0–8.0)
Protein, ur: >=300 mg/dL — AB
Specific Gravity, Urine: 1.009 (ref 1.005–1.030)
Squamous Epithelial / LPF: NONE SEEN

## 2017-03-20 LAB — CBC
HEMATOCRIT: 31.4 % — AB (ref 36.0–46.0)
Hemoglobin: 9.4 g/dL — ABNORMAL LOW (ref 12.0–15.0)
MCH: 26.3 pg (ref 26.0–34.0)
MCHC: 29.9 g/dL — AB (ref 30.0–36.0)
MCV: 87.7 fL (ref 78.0–100.0)
PLATELETS: 236 10*3/uL (ref 150–400)
RBC: 3.58 MIL/uL — ABNORMAL LOW (ref 3.87–5.11)
RDW: 15.4 % (ref 11.5–15.5)
WBC: 8.8 10*3/uL (ref 4.0–10.5)

## 2017-03-20 LAB — I-STAT TROPONIN, ED: TROPONIN I, POC: 0 ng/mL (ref 0.00–0.08)

## 2017-03-20 LAB — BRAIN NATRIURETIC PEPTIDE: B Natriuretic Peptide: 710.5 pg/mL — ABNORMAL HIGH (ref 0.0–100.0)

## 2017-03-20 LAB — PROTIME-INR
INR: 4.04 — AB
Prothrombin Time: 39.8 seconds — ABNORMAL HIGH (ref 11.4–15.2)

## 2017-03-20 LAB — TROPONIN I: Troponin I: <0.03 ng/mL (ref <0.03)

## 2017-03-20 MED ORDER — TRAZODONE HCL 50 MG PO TABS
50.0000 mg | ORAL_TABLET | Freq: Every evening | ORAL | Status: DC | PRN
  Administered 2017-03-20 – 2017-03-31 (×5): 50 mg via ORAL
  Filled 2017-03-20 (×5): qty 1

## 2017-03-20 MED ORDER — SODIUM CHLORIDE 0.9 % IV SOLN
250.0000 mL | INTRAVENOUS | Status: DC | PRN
Start: 2017-03-20 — End: 2017-04-08

## 2017-03-20 MED ORDER — ESCITALOPRAM OXALATE 10 MG PO TABS
10.0000 mg | ORAL_TABLET | Freq: Every day | ORAL | Status: DC
  Administered 2017-03-21 – 2017-04-08 (×20): 10 mg via ORAL
  Filled 2017-03-20 (×19): qty 1

## 2017-03-20 MED ORDER — ALBUTEROL SULFATE (2.5 MG/3ML) 0.083% IN NEBU
5.0000 mg | INHALATION_SOLUTION | Freq: Once | RESPIRATORY_TRACT | Status: AC
  Administered 2017-03-20: 5 mg via RESPIRATORY_TRACT
  Filled 2017-03-20: qty 6

## 2017-03-20 MED ORDER — LEVOFLOXACIN IN D5W 750 MG/150ML IV SOLN: 750.0000 mg | INTRAVENOUS | Status: DC

## 2017-03-20 MED ORDER — FUROSEMIDE 10 MG/ML IJ SOLN
40.0000 mg | Freq: Two times a day (BID) | INTRAMUSCULAR | Status: DC
  Administered 2017-03-21 – 2017-03-22 (×3): 40 mg via INTRAVENOUS
  Filled 2017-03-20 (×3): qty 4

## 2017-03-20 MED ORDER — METOPROLOL TARTRATE 12.5 MG HALF TABLET
12.5000 mg | ORAL_TABLET | Freq: Two times a day (BID) | ORAL | Status: DC
  Administered 2017-03-20 – 2017-03-21 (×2): 12.5 mg via ORAL
  Filled 2017-03-20 (×2): qty 1

## 2017-03-20 MED ORDER — ONDANSETRON HCL 4 MG PO TABS: 4.0000 mg | ORAL_TABLET | Freq: Four times a day (QID) | ORAL | Status: DC | PRN

## 2017-03-20 MED ORDER — DEXTROSE 5 % IV SOLN
1.0000 g | INTRAVENOUS | Status: DC
  Administered 2017-03-20 – 2017-03-21 (×2): 1 g via INTRAVENOUS
  Filled 2017-03-20 (×3): qty 10

## 2017-03-20 MED ORDER — ACETAMINOPHEN 650 MG RE SUPP: 650.0000 mg | Freq: Four times a day (QID) | RECTAL | Status: DC | PRN

## 2017-03-20 MED ORDER — ALBUTEROL SULFATE (2.5 MG/3ML) 0.083% IN NEBU
2.5000 mg | INHALATION_SOLUTION | RESPIRATORY_TRACT | Status: DC | PRN
  Administered 2017-03-29 (×3): 2.5 mg via RESPIRATORY_TRACT
  Filled 2017-03-20 (×3): qty 3

## 2017-03-20 MED ORDER — VITAMIN D 1000 UNITS PO TABS
1000.0000 [IU] | ORAL_TABLET | Freq: Every morning | ORAL | Status: DC
  Administered 2017-03-21 – 2017-04-08 (×19): 1000 [IU] via ORAL
  Filled 2017-03-20 (×20): qty 1

## 2017-03-20 MED ORDER — GUAIFENESIN ER 600 MG PO TB12
600.0000 mg | ORAL_TABLET | Freq: Two times a day (BID) | ORAL | Status: DC
  Administered 2017-03-20 – 2017-04-08 (×38): 600 mg via ORAL
  Filled 2017-03-20 (×38): qty 1

## 2017-03-20 MED ORDER — ONDANSETRON HCL 4 MG PO TABS
4.0000 mg | ORAL_TABLET | Freq: Four times a day (QID) | ORAL | Status: DC
  Administered 2017-03-20 – 2017-03-28 (×26): 4 mg via ORAL
  Filled 2017-03-20 (×26): qty 1

## 2017-03-20 MED ORDER — ENSURE ENLIVE PO LIQD
237.0000 mL | Freq: Two times a day (BID) | ORAL | Status: DC
  Administered 2017-03-21 – 2017-04-07 (×26): 237 mL via ORAL

## 2017-03-20 MED ORDER — ONDANSETRON HCL 4 MG/2ML IJ SOLN: 4.0000 mg | Freq: Four times a day (QID) | INTRAMUSCULAR | Status: DC | PRN

## 2017-03-20 MED ORDER — POTASSIUM CHLORIDE CRYS ER 20 MEQ PO TBCR
20.0000 meq | EXTENDED_RELEASE_TABLET | Freq: Every day | ORAL | Status: DC
  Administered 2017-03-21 – 2017-03-22 (×2): 20 meq via ORAL
  Filled 2017-03-20 (×3): qty 1

## 2017-03-20 MED ORDER — FENOFIBRATE 160 MG PO TABS
160.0000 mg | ORAL_TABLET | Freq: Every day | ORAL | Status: DC
  Administered 2017-03-20 – 2017-04-08 (×20): 160 mg via ORAL
  Filled 2017-03-20 (×20): qty 1

## 2017-03-20 MED ORDER — HYDRALAZINE HCL 25 MG PO TABS
75.0000 mg | ORAL_TABLET | Freq: Two times a day (BID) | ORAL | Status: DC
  Administered 2017-03-20 – 2017-04-08 (×34): 75 mg via ORAL
  Filled 2017-03-20 (×39): qty 1

## 2017-03-20 MED ORDER — FUROSEMIDE 10 MG/ML IJ SOLN
40.0000 mg | Freq: Once | INTRAMUSCULAR | Status: AC
  Administered 2017-03-20: 40 mg via INTRAVENOUS
  Filled 2017-03-20: qty 4

## 2017-03-20 MED ORDER — IPRATROPIUM BROMIDE 0.02 % IN SOLN
0.5000 mg | Freq: Once | RESPIRATORY_TRACT | Status: AC
  Administered 2017-03-20: 0.5 mg via RESPIRATORY_TRACT
  Filled 2017-03-20: qty 2.5

## 2017-03-20 MED ORDER — WARFARIN - PHARMACIST DOSING INPATIENT
Freq: Every day | Status: DC
  Administered 2017-03-26: 1

## 2017-03-20 MED ORDER — ADULT MULTIVITAMIN W/MINERALS CH
1.0000 | ORAL_TABLET | Freq: Every day | ORAL | Status: DC
  Administered 2017-03-21 – 2017-04-08 (×19): 1 via ORAL
  Filled 2017-03-20 (×19): qty 1

## 2017-03-20 MED ORDER — ACETAMINOPHEN 325 MG PO TABS
650.0000 mg | ORAL_TABLET | Freq: Four times a day (QID) | ORAL | Status: DC | PRN
  Administered 2017-03-29: 650 mg via ORAL
  Filled 2017-03-20: qty 2

## 2017-03-20 MED ORDER — ATORVASTATIN CALCIUM 20 MG PO TABS
20.0000 mg | ORAL_TABLET | Freq: Every day | ORAL | Status: DC
  Administered 2017-03-20 – 2017-04-08 (×20): 20 mg via ORAL
  Filled 2017-03-20 (×20): qty 1

## 2017-03-20 MED ORDER — POLYSACCHARIDE IRON COMPLEX 150 MG PO CAPS
150.0000 mg | ORAL_CAPSULE | Freq: Every day | ORAL | Status: DC
  Administered 2017-03-21 – 2017-03-29 (×9): 150 mg via ORAL
  Filled 2017-03-20 (×9): qty 1

## 2017-03-20 MED ORDER — SODIUM CHLORIDE 0.9% FLUSH: 3.0000 mL | INTRAVENOUS | Status: DC | PRN

## 2017-03-20 MED ORDER — ALBUTEROL SULFATE HFA 108 (90 BASE) MCG/ACT IN AERS: 2.0000 | INHALATION_SPRAY | Freq: Four times a day (QID) | RESPIRATORY_TRACT | Status: DC | PRN

## 2017-03-20 MED ORDER — GABAPENTIN 100 MG PO CAPS
100.0000 mg | ORAL_CAPSULE | Freq: Every day | ORAL | Status: DC
  Administered 2017-03-20 – 2017-04-07 (×19): 100 mg via ORAL
  Filled 2017-03-20 (×19): qty 1

## 2017-03-20 MED ORDER — POLYETHYLENE GLYCOL 3350 17 G PO PACK
17.0000 g | PACK | Freq: Every day | ORAL | Status: DC
  Administered 2017-03-21 – 2017-03-29 (×8): 17 g via ORAL
  Filled 2017-03-20 (×9): qty 1

## 2017-03-20 MED ORDER — ISOSORBIDE MONONITRATE ER 30 MG PO TB24
30.0000 mg | ORAL_TABLET | Freq: Every day | ORAL | Status: DC
  Administered 2017-03-21 – 2017-04-08 (×19): 30 mg via ORAL
  Filled 2017-03-20 (×19): qty 1

## 2017-03-20 MED ORDER — METHYLPREDNISOLONE SODIUM SUCC 125 MG IJ SOLR
80.0000 mg | Freq: Once | INTRAMUSCULAR | Status: AC
  Administered 2017-03-20: 80 mg via INTRAVENOUS
  Filled 2017-03-20: qty 2

## 2017-03-20 MED ORDER — INFLUENZA VAC SPLIT HIGH-DOSE 0.5 ML IM SUSY
0.5000 mL | PREFILLED_SYRINGE | INTRAMUSCULAR | Status: AC
  Administered 2017-03-22: 0.5 mL via INTRAMUSCULAR
  Filled 2017-03-20: qty 0.5

## 2017-03-20 MED ORDER — SODIUM CHLORIDE 0.9% FLUSH
3.0000 mL | Freq: Two times a day (BID) | INTRAVENOUS | Status: DC
  Administered 2017-03-20 – 2017-04-07 (×34): 3 mL via INTRAVENOUS

## 2017-03-20 NOTE — ED Provider Notes (Signed)
[facility] Provider Note   CSN: [id number] Arrival date & time: 03/20/17  1109     History   Chief Complaint Chief Complaint  Patient presents with  . Shortness of Breath    HPI Alexis [name] is a 83 y.o. female.  Patient w hx copd, afib, c/o sob in the past couple days. States has increased her home o2 due to sob. Denies regular mdi/neb use at home. Sob moderate, constant, persistent, worse today. Denies chest pain or discomfort. No cough or uri c/o. No fever or chills. No abd pain. No nvd. Denies dysuria. Denies leg swelling or pain. States compliant w normal home meds.  No pnd.     Shortness of Breath  Pertinent negatives include no fever, no headaches, no sore throat, no neck pain, no cough, no chest pain, no vomiting, no abdominal pain, no rash and no leg swelling.    Past Medical History:  Diagnosis Date  . Anxiety   . Chronic diastolic CHF (congestive heart failure) (HCC)    a. 05/2015 Echo: EF 55-60%, mild LVH, no rwma, mild MR, mildly dil RA, mod TR, PASP 58mmHg.  . CKD (chronic kidney disease), stage IV (HCC)   . Confusion state   . COPD (chronic obstructive pulmonary disease) (HCC)    a. on home O2 @ 3lpm.  . Elevated troponin    a. H/o elevated trop in setting of CHF; b. 06/2013 Lexiscan MV: nl EF, mild anterior breast attenuation, no ischemia.   . Family history of adverse reaction to anesthesia   . GIB (gastrointestinal bleeding)    a. History of GIB.  Marland Kitchen. Hypertensive heart disease   . Macular degeneration, bilateral   . Neuropathy   . Paroxysmal atrial fibrillation (HCC)    a. CHA2DS2VASc = 6-->coumadin.  Marland Kitchen. PONV (postoperative nausea and vomiting)   . Type II diabetes mellitus (HCC)   . Varicose veins     Patient Active Problem List   Diagnosis Date Noted  . Frequent falls 02/14/2017  . Aortic atherosclerosis (HCC) 02/14/2017  . Peripheral vascular disease (HCC) 02/14/2017  . CKD (chronic kidney  disease), stage IV (HCC) 03/04/2016  . Depression with anxiety 02/28/2016  . HLD (hyperlipidemia) 02/28/2016  . Generalized weakness   . AKI (acute kidney injury) (HCC)   . Type II diabetes mellitus (HCC)   . Hypertensive heart disease with heart failure (HCC)   . Atrial fibrillation, persistent (HCC)   . Simple chronic bronchitis (HCC)   . Hypertensive urgency   . Chronic respiratory failure with hypoxia (HCC) 06/30/2015  . Hypoglycemia associated with type 2 diabetes mellitus (HCC) 06/30/2015  . UTI (urinary tract infection) 06/30/2015  . Hypoglycemia associated with diabetes (HCC) 06/30/2015  . Hyperthyroidism 01/23/2015  . Long term current use of anticoagulant therapy 12/12/2014  . Paroxysmal atrial fibrillation (HCC) 12/01/2014  . Melena   . Normocytic anemia 11/29/2014  . COPD (chronic obstructive pulmonary disease) (HCC) 07/12/2014  . (HFpEF) heart failure with preserved ejection fraction (HCC) 07/16/2013  . Essential hypertension 06/10/2013  . Chronic diastolic heart failure, NYHA class 1 (HCC) 06/09/2013    Past Surgical History:  Procedure Laterality Date  . ABDOMINAL HYSTERECTOMY    . CARDIOVERSION N/A 09/22/2015   Procedure: CARDIOVERSION;  Surgeon: Chrystie NoseKenneth C Hilty, MD;  [facility];  Service: Cardiovascular;  Laterality: N/A;  . ESOPHAGOGASTRODUODENOSCOPY N/A 12/01/2014   Procedure: ESOPHAGOGASTRODUODENOSCOPY (EGD);  Surgeon: Hilarie FredricksonJohn N Perry, MD;  [facility];  Service: Endoscopy;  Laterality: N/A;  . FRACTURE SURGERY    . HIP FRACTURE SURGERY  2004  . VEIN SURGERY      OB History    No data available       Home Medications    Prior to Admission medications   Medication Sig Start Date End Date Taking? Authorizing Provider  albuterol (PROVENTIL HFA;VENTOLIN HFA) 108 (90 BASE) MCG/ACT inhaler Inhale 2 puffs into the lungs every 6 (six) hours as needed for wheezing or shortness of breath. 06/11/13   Dhungel, Theda Belfast, MD  atorvastatin (LIPITOR)  20 MG tablet Take 20 mg by mouth daily.    [provider]  cephALEXin (KEFLEX) 500 MG capsule Take 1 capsule (500 mg total) by mouth every 12 (twelve) hours. Starting 9/23 02/16/17   Deneise Lever, MD  cholecalciferol (VITAMIN D) 1000 UNITS tablet Take 1,000 Units by mouth every morning.     [provider]  docusate sodium (COLACE) 100 MG capsule Take 100 mg by mouth 2 (two) times daily as needed for mild constipation.    [provider]  escitalopram (LEXAPRO) 10 MG tablet Take 10 mg by mouth daily.    [provider]  fenofibrate 160 MG tablet Take 160 mg by mouth daily.    [provider]  gabapentin (NEURONTIN) 100 MG capsule Take 1 capsule (100 mg total) by mouth at bedtime. 08/05/15   Rodolph Bong, MD  hydrALAZINE (APRESOLINE) 50 MG tablet Take 1.5 tablets (75 mg total) by mouth 2 (two) times daily. 02/15/17   Deneise Lever, MD  iron polysaccharides (NIFEREX) 150 MG capsule Take 1 capsule (150 mg total) by mouth daily. 09/22/15   Rai, Delene Ruffini, MD  isosorbide mononitrate (IMDUR) 30 MG 24 hr tablet TAKE 1 TABLET BY MOUTH EVERY DAY 07/29/16   Hilty, Lisette Abu, MD  metoprolol tartrate (LOPRESSOR) 25 MG tablet Take 0.5 tablets (12.5 mg total) by mouth 2 (two) times daily. 12/30/16   Chrystie Nose, MD  Multiple Vitamin (MULTIVITAMIN WITH MINERALS) TABS tablet Take 1 tablet by mouth daily.    [provider]  ondansetron (ZOFRAN) 4 MG tablet Take 1 tablet (4 mg total) by mouth every 6 (six) hours. 08/14/16   Gilda Crease, MD  OXYGEN Inhale 3 L into the lungs at bedtime.     [provider]  polyethylene glycol (MIRALAX / GLYCOLAX) packet Take 17 g by mouth daily as needed for mild constipation.    [provider]  potassium chloride SA (KLOR-CON M20) 20 MEQ tablet Take 1 tablet (20 mEq total) by mouth daily. 05/22/16   Hilty, Lisette Abu, MD  torsemide (DEMADEX) 20 MG tablet Take 2 tablets (40 mg total) by mouth  daily. 05/21/16   Chrystie Nose, MD  warfarin (COUMADIN) 4 MG tablet TAKE 1 TO 1&1/2 TABLETS BY MOUTH AS DIRECTED BY COUMADIN CLINIC 03/13/17   Hilty, Lisette Abu, MD    Family History Family History  Problem Relation Age of Onset  . Colon cancer Father 44       advanced when discovered, no surgery or therapy.  this led to his death  . Breast cancer Mother   . Hypertension Mother   . Alzheimer's disease Mother   . Breast cancer Sister   . Cancer Brother   . Lung cancer Brother   . Thyroid disease Neg Hx   . Heart attack Neg Hx     Social History Social History  Substance Use Topics  . Smoking status: Passive  Smoke Exposure - Never Smoker  . Smokeless tobacco: Never Used  . Alcohol use No     Allergies   Yellow dyes (non-tartrazine); Codeine; Other; Penicillins; and Procaine   Review of Systems Review of Systems  Constitutional: Negative for fever.  HENT: Negative for sore throat.   Eyes: Negative for redness.  Respiratory: Positive for shortness of breath. Negative for cough.   Cardiovascular: Negative for chest pain and leg swelling.  Gastrointestinal: Negative for abdominal pain and vomiting.  Genitourinary: Negative for flank pain.  Musculoskeletal: Negative for back pain and neck pain.  Skin: Negative for rash.  Neurological: Negative for headaches.  Hematological: Does not bruise/bleed easily.  Psychiatric/Behavioral: Negative for confusion.     Physical Exam Updated Vital Signs BP (!) 193/65 (BP Location: Left Arm)   Pulse 92   Temp 98.2 F (36.8 C) (Oral)   Resp 20   Ht 1.575 m (5\' 2" )   Wt 68 kg (150 lb)   SpO2 (!) 89%   BMI 27.44 kg/m   Physical Exam  Constitutional: She appears well-developed and well-nourished. No distress.  HENT:  Mouth/Throat: Oropharynx is clear and moist.  Eyes: Conjunctivae are normal. No scleral icterus.  Neck: Neck supple. No JVD present. No tracheal deviation present.  Cardiovascular: Normal rate, normal heart  sounds and intact distal pulses.  Exam reveals no gallop and no friction rub.   No murmur heard. Pulmonary/Chest: Effort normal. No respiratory distress. She has wheezes.  Abdominal: Soft. Normal appearance. She exhibits no distension. There is no tenderness.  Genitourinary:  Genitourinary Comments: No cva tenderness  Musculoskeletal: She exhibits no edema or tenderness.  Neurological: She is alert.  Skin: Skin is warm and dry. No rash noted. She is not diaphoretic.  Psychiatric: She has a normal mood and affect.  Nursing note and vitals reviewed.    ED Treatments / Results  Labs (all labs ordered are listed, but only abnormal results are displayed) Results for orders placed or performed during the hospital encounter of 03/20/17  Basic metabolic panel  Result Value Ref Range   Sodium 141 135 - 145 mmol/L   Potassium 4.2 3.5 - 5.1 mmol/L   Chloride 104 101 - 111 mmol/L   CO2 27 22 - 32 mmol/L   Glucose, Bld 205 (H) 65 - 99 mg/dL   BUN 38 (H) 6 - 20 mg/dL   Creatinine, Ser 1.61 (H) 0.44 - 1.00 mg/dL   Calcium 8.8 (L) 8.9 - 10.3 mg/dL   GFR calc non Af Amer 23 (L) >60 mL/min   GFR calc Af Amer 27 (L) >60 mL/min   Anion gap 10 5 - 15  CBC  Result Value Ref Range   WBC 8.8 4.0 - 10.5 K/uL   RBC 3.58 (L) 3.87 - 5.11 MIL/uL   Hemoglobin 9.4 (L) 12.0 - 15.0 g/dL   HCT 09.6 (L) 04.5 - 40.9 %   MCV 87.7 78.0 - 100.0 fL   MCH 26.3 26.0 - 34.0 pg   MCHC 29.9 (L) 30.0 - 36.0 g/dL   RDW 81.1 91.4 - 78.2 %   Platelets 236 150 - 400 K/uL  Troponin I  Result Value Ref Range   Troponin I <0.03 <0.03 ng/mL  Brain natriuretic peptide  Result Value Ref Range   B Natriuretic Peptide 710.5 (H) 0.0 - 100.0 pg/mL  Protime-INR  Result Value Ref Range   Prothrombin Time 39.8 (H) 11.4 - 15.2 seconds   INR 4.04 (HH)   Urinalysis, Routine w  reflex microscopic  Result Value Ref Range   Color, Urine YELLOW YELLOW   APPearance HAZY (A) CLEAR   Specific Gravity, Urine 1.009 1.005 - 1.030   pH  7.0 5.0 - 8.0   Glucose, UA NEGATIVE NEGATIVE mg/dL   Hgb urine dipstick NEGATIVE NEGATIVE   Bilirubin Urine NEGATIVE NEGATIVE   Ketones, ur NEGATIVE NEGATIVE mg/dL   Protein, ur >=161 (A) NEGATIVE mg/dL   Nitrite NEGATIVE NEGATIVE   Leukocytes, UA SMALL (A) NEGATIVE   RBC / HPF 0-5 0 - 5 RBC/hpf   WBC, UA TOO NUMEROUS TO COUNT 0 - 5 WBC/hpf   Bacteria, UA MANY (A) NONE SEEN   Squamous Epithelial / LPF NONE SEEN NONE SEEN   Mucus PRESENT   I-stat troponin, ED  Result Value Ref Range   Troponin i, poc 0.00 0.00 - 0.08 ng/mL   Comment 3           Dg Chest 2 View  Result Date: 03/20/2017 CLINICAL DATA:  Shortness of breath. EXAM: CHEST  2 VIEW COMPARISON:  03/01/2016 FINDINGS: The cardiac silhouette remains enlarged. There are patchy, relatively symmetric airspace opacities throughout both lungs. Tiny bilateral pleural effusions are present. No pneumothorax is identified. No acute osseous abnormality is seen. Old left rib fractures are again noted. IMPRESSION: Diffuse bilateral airspace disease which may reflect pneumonia or edema. Electronically Signed   By: Sebastian Ache M.D.   On: 03/20/2017 12:02    EKG  EKG Interpretation  Date/Time:  Thursday March 20 2017 11:16:55 EDT Ventricular Rate:  92 PR Interval:    QRS Duration: 88 QT Interval:  380 QTC Calculation: 471 R Axis:   32 Text Interpretation:  Sinus rhythm Prolonged PR interval Consider left ventricular hypertrophy Nonspecific ST abnormality Confirmed by Cathren Laine ([id number]) on 03/20/2017 11:19:55 AM       Radiology Dg Chest 2 View  Result Date: 03/20/2017 CLINICAL DATA:  Shortness of breath. EXAM: CHEST  2 VIEW COMPARISON:  03/01/2016 FINDINGS: The cardiac silhouette remains enlarged. There are patchy, relatively symmetric airspace opacities throughout both lungs. Tiny bilateral pleural effusions are present. No pneumothorax is identified. No acute osseous abnormality is seen. Old left rib fractures are again noted.  IMPRESSION: Diffuse bilateral airspace disease which may reflect pneumonia or edema. Electronically Signed   By: Sebastian Ache M.D.   On: 03/20/2017 12:02    Procedures Procedures (including critical care time)  Medications Ordered in ED Medications - No data to display   Initial Impression / Assessment and Plan / ED Course  I have reviewed the triage vital signs and the nursing notes.  Pertinent labs & imaging results that were available during my care of the patient were reviewed by me and considered in my medical decision making (see chart for details).  Iv ns. Labs. Monitor. Ecg. Cxr.  Albuterol and atrovent neb.   Solumedrol iv.  Reviewed nursing notes and prior charts for additional history.   Persistent wheezing.  Additional albuterol and atrovent neb.  ?chf on cxr - lasix iv.  Due to persistent dyspnea, increased o2 requirement, will admit.     Final Clinical Impressions(s) / ED Diagnoses   Final diagnoses:  None    New Prescriptions New Prescriptions   No medications on file     Cathren Laine, MD 03/20/17 1319

## 2017-03-20 NOTE — ED Triage Notes (Signed)
PER EMS about two hours ago this pt called 911 for SOB.  [facility] arrived first and had an intial SPO2 in low 80s.  She received and albuterol neb and has been stable ever since with an SPO2 of 96.  Her RA spo2 was 88.  She is currently resting in bed NAD noted at this time

## 2017-03-20 NOTE — Progress Notes (Signed)
ANTICOAGULATION CONSULT NOTE - Initial Consult  Pharmacy Consult for warfarin Indication: atrial fibrillation  Allergies  Allergen Reactions  . Yellow Dyes (Non-Tartrazine) Nausea And Vomiting    "deathly allergic" No other information given to explain reaction. Per Daughter- this was an IV dye. Ok with yellow coloring on Coumadin tablets.   . Codeine Hives  . Other Other (See Comments)    novocaine broke mouth out  . Penicillins Hives  . Procaine Hives    Patient Measurements: Height: 5\' 2"  (157.5 cm) Weight: 150 lb (68 kg) IBW/kg (Calculated) : 50.1   Vital Signs: Temp: 98.2 F (36.8 C) (10/25 1112) Temp Source: Oral (10/25 1112) BP: 158/57 (10/25 1530) Pulse Rate: 106 (10/25 1530)  Labs:  Recent Labs  03/20/17 1130  HGB 9.4*  HCT 31.4*  PLT 236  LABPROT 39.8*  INR 4.04*  CREATININE 1.93*  TROPONINI <0.03    Estimated Creatinine Clearance: 20 mL/min (A) (by C-G formula based on SCr of 1.93 mg/dL (H)).   Medical History: Past Medical History:  Diagnosis Date  . Anxiety   . Chronic diastolic CHF (congestive heart failure) (HCC)    a. 05/2015 Echo: EF 55-60%, mild LVH, no rwma, mild MR, mildly dil RA, mod TR, PASP .  . CKD (chronic kidney disease), stage IV (HCC)   . Confusion state   . COPD (chronic obstructive pulmonary disease) (HCC)    a. on home O2 @ 3lpm.  . Elevated troponin    a. H/o elevated trop in setting of CHF; b. 06/2013 Lexiscan MV: nl EF, mild anterior breast attenuation, no ischemia.   . Family history of adverse reaction to anesthesia   . GIB (gastrointestinal bleeding)    a. History of GIB.  Marland Kitchen Hypertensive heart disease   . Macular degeneration, bilateral   . Neuropathy   . Paroxysmal atrial fibrillation (HCC)    a. CHA2DS2VASc = 6-->coumadin.  Marland Kitchen PONV (postoperative nausea and vomiting)   . Type II diabetes mellitus (HCC)   . Varicose veins      Assessment: Alexis [name] on warfarin PTA for AFib- Home dose (as per Coumadin  Clinic note on 10/12) was 4mg  daily except 6mg  on Mondays- dose was reduced to that regimen during that appointment d/t INR of 3.6 in the clinic. INR today is 4.04. Hgb 9.4, plts 236. No bleeding noted.  Goal of Therapy:  INR 2-3 Monitor platelets by anticoagulation protocol: Yes   Plan:  Hold warfarin tonight Daily INR and CBC Follow s/s bleeding  Dasani Crear D. Bennet Kujawa, PharmD, BCPS Clinical Pharmacist [phone] 03/20/2017 3:50 PM

## 2017-03-20 NOTE — ED Notes (Signed)
Family affirms DNR

## 2017-03-20 NOTE — H&P (Addendum)
Patient Demographics:    Alexis [name], is a 83 y.o. female  MRN: [id number]   DOB - 12-24-1931  Admit Date - 03/20/2017  Outpatient Primary MD for the patient is [name], PA-C   Assessment & Plan:    Principal Problem:   Acute exacerbation of CHF (congestive heart failure) (HCC) Active Problems:   (HFpEF) heart failure with preserved ejection fraction (HCC)   Paroxysmal atrial fibrillation (HCC)   Chronic respiratory failure with hypoxia (HCC)   UTI (urinary tract infection)   Type II diabetes mellitus (HCC)    1)HFpEF - last known EF around 60%, patient presenting with acute on chronic diastolic CHF exacerbation, IV Lasix as ordered, patient endorses weight gain, orthopnea, chest x-ray suggest CHF exacerbation/Edema, BNP elevated , check serial troponins and EKG to rule out ACS, monitor weight, closely watch renal function and electrolytes closely with IV diuresis, continue supplemental oxygen as well as isosorbide. Pneumonia clinically thought to be less likely as patient has no fevers, no leukocytosis, no productive cough  2)H/o Afib- INR is currently 4.0 Coumadin to be dosed per pharmacy, continue metoprolol 12.5 twice a day for rate control  3)Possible UTI- UA is very suggestive of UTI, patient has urinary frequency, give IV Rocephin as ordered in the urine cultures, patient is not septic  4)Acute and Chronic Hypoxic Respiratory Failure- hypoxia has worsened, dosage requirement is more, secondary to #1 above, presentation appears to be more consistent with CHF and COPD, patient received IV Solu-Medrol in the ED, hold off on further steroids at this time  5)HTN- okay to restart hydralazine 75 mg twice a day, metoprolol 12.5 mg twice a day and isosorbide 30 mg daily,  6)Social/Ethics- DNR/DNI,  confirmed with patient and family, RN  at bedside   7)CKD IV- creatinine currently is 1.93 which is close to her recent baseline, closely watch renal function and electrolytes closely with IV diuresis, avoid nephrotoxic agents  8)Anemia- multifactorial, suspect some degree of anemia of chronic kidney disease, hemoglobin is actually higher than recent baseline, no evidence of ongoing bleeding, continue to monitor closely especially given supratherapeutic INR, transfusion not indicated at this time  With History of - Reviewed by me  Past Medical History:  Diagnosis Date  . Anxiety   . Chronic diastolic CHF (congestive heart failure) (HCC)    a. 05/2015 Echo: EF 55-60%, mild LVH, no rwma, mild MR, mildly dil RA, mod TR, PASP .  . CKD (chronic kidney disease), stage IV (HCC)   . Confusion state   . COPD (chronic obstructive pulmonary disease) (HCC)    a. on home O2 @ 3lpm.  . Elevated troponin    a. H/o elevated trop in setting of CHF; b. 06/2013 Lexiscan MV: nl EF, mild anterior breast attenuation, no ischemia.   . Family history of adverse reaction to anesthesia   . GIB (gastrointestinal bleeding)    a. History of GIB.  Marland Kitchen Hypertensive heart disease   .  Macular degeneration, bilateral   . Neuropathy   . Paroxysmal atrial fibrillation (HCC)    a. CHA2DS2VASc = 6-->coumadin.  Marland Kitchen PONV (postoperative nausea and vomiting)   . Type II diabetes mellitus (HCC)   . Varicose veins       Past Surgical History:  Procedure Laterality Date  . ABDOMINAL HYSTERECTOMY    . CARDIOVERSION N/A 09/22/2015   Procedure: CARDIOVERSION;  Surgeon: Chrystie Nose, MD;  Location: [facility] ENDOSCOPY;  Service: Cardiovascular;  Laterality: N/A;  . ESOPHAGOGASTRODUODENOSCOPY N/A 12/01/2014   Procedure: ESOPHAGOGASTRODUODENOSCOPY (EGD);  Surgeon: Hilarie Fredrickson, MD;  Location: [facility] ENDOSCOPY;  Service: Endoscopy;  Laterality: N/A;  . FRACTURE SURGERY    . HIP FRACTURE SURGERY  2004  . VEIN SURGERY        Chief  Complaint  Patient presents with  . Shortness of Breath      HPI:    Alexis [name]  is a 83 y.o. female, with past medical history relevant for diastolic dysfunction CHF as well as history of COPD who is chronically on 2-3 L of oxygen at home residence to the ED with a 2 to three-day history of worsening shortness of breath, orthopnea and increased oxygen requirement. No frank chest pains, patient does have increasing weight gain, shortness of breath and orthopnea.  No significant leg pains or pleuritic symptoms, patient had in her frequency but no dysuria, additional history obtained from patient's son and daughter is at bedside  In ED... She received bronchodilator treatments, IV Solu-Medrol and IV Lasix with some output, respiratory status improved after above measures    Review of systems:    In addition to the HPI above,   A full 12 point Review of 10 Systems was done, except as stated above, all other Review of 10 Systems were negative.    Social History:  Reviewed by me    Social History  Substance Use Topics  . Smoking status: Passive Smoke Exposure - Never Smoker  . Smokeless tobacco: Never Used  . Alcohol use No     Family History :  Reviewed by me   Family History  Problem Relation Age of Onset  . Colon cancer Father 71       advanced when discovered, no surgery or therapy.  this led to his death  . Breast cancer Mother   . Hypertension Mother   . Alzheimer's disease Mother   . Breast cancer Sister   . Cancer Brother   . Lung cancer Brother   . Thyroid disease Neg Hx   . Heart attack Neg Hx      Home Medications:   Prior to Admission medications   Medication Sig Start Date End Date Taking? Authorizing Provider  albuterol (PROVENTIL HFA;VENTOLIN HFA) 108 (90 BASE) MCG/ACT inhaler Inhale 2 puffs into the lungs every 6 (six) hours as needed for wheezing or shortness of breath. 06/11/13   Dhungel, Theda Belfast, MD  atorvastatin (LIPITOR) 20 MG tablet Take  20 mg by mouth daily.    [provider]  cephALEXin (KEFLEX) 500 MG capsule Take 1 capsule (500 mg total) by mouth every 12 (twelve) hours. Starting 9/23 02/16/17   Deneise Lever, MD  cholecalciferol (VITAMIN D) 1000 UNITS tablet Take 1,000 Units by mouth every morning.     [provider]  docusate sodium (COLACE) 100 MG capsule Take 100 mg by mouth 2 (two) times daily as needed for mild constipation.    [provider]  escitalopram (LEXAPRO) 10 MG tablet  Take 10 mg by mouth daily.    [provider]  fenofibrate 160 MG tablet Take 160 mg by mouth daily.    [provider]  gabapentin (NEURONTIN) 100 MG capsule Take 1 capsule (100 mg total) by mouth at bedtime. 08/05/15   Rodolph Bonghompson, Daniel V, MD  hydrALAZINE (APRESOLINE) 50 MG tablet Take 1.5 tablets (75 mg total) by mouth 2 (two) times daily. 02/15/17   Deneise LeverSaraiya, Parth, MD  iron polysaccharides (NIFEREX) 150 MG capsule Take 1 capsule (150 mg total) by mouth daily. 09/22/15   Rai, Delene Ruffiniipudeep K, MD  isosorbide mononitrate (IMDUR) 30 MG 24 hr tablet TAKE 1 TABLET BY MOUTH EVERY DAY 07/29/16   Hilty, Lisette AbuKenneth C, MD  metoprolol tartrate (LOPRESSOR) 25 MG tablet Take 0.5 tablets (12.5 mg total) by mouth 2 (two) times daily. 12/30/16   Chrystie NoseHilty, Kenneth C, MD  Multiple Vitamin (MULTIVITAMIN WITH MINERALS) TABS tablet Take 1 tablet by mouth daily.    [provider]  ondansetron (ZOFRAN) 4 MG tablet Take 1 tablet (4 mg total) by mouth every 6 (six) hours. 08/14/16   Gilda CreasePollina, Christopher J, MD  OXYGEN Inhale 3 L into the lungs at bedtime.     [provider]  polyethylene glycol (MIRALAX / GLYCOLAX) packet Take 17 g by mouth daily as needed for mild constipation.    [provider]  potassium chloride SA (KLOR-CON M20) 20 MEQ tablet Take 1 tablet (20 mEq total) by mouth daily. 05/22/16   Hilty, Lisette AbuKenneth C, MD  torsemide (DEMADEX) 20 MG tablet Take 2 tablets (40 mg total) by mouth daily. 05/21/16    Chrystie NoseHilty, Kenneth C, MD  warfarin (COUMADIN) 4 MG tablet TAKE 1 TO 1&1/2 TABLETS BY MOUTH AS DIRECTED BY COUMADIN CLINIC 03/13/17   Hilty, Lisette AbuKenneth C, MD     Allergies:     Allergies  Allergen Reactions  . Yellow Dyes (Non-Tartrazine) Nausea And Vomiting    "deathly allergic" No other information given to explain reaction. Per Daughter- this was an IV dye. Ok with yellow coloring on Coumadin tablets.   . Codeine Hives  . Other Other (See Comments)    novocaine broke mouth out  . Penicillins Hives  . Procaine Hives     Physical Exam:   Vitals  Blood pressure (!) 158/57, pulse (!) 106, temperature 98.2 F (36.8 C), temperature source Oral, resp. rate 15, height 5\' 2"  (1.575 m), weight 68 kg (150 lb), SpO2 96 %.  Physical Examination: General appearance - alert,  and able to speak in short sentences  Mental status - alert, oriented to person, place, and time, Nose- [place] at 4 L/min Eyes - sclera anicteric Neck - supple, no JVD elevation , Chest - diminished breath sounds with bibasilar rales, apparently she was wheezing earlier, at the time of my evaluation no significant wheezing Heart - S1 and S2 normal, irregularly irregular heart rate 110 Abdomen - soft, nontender, nondistended, no CVA tenderness Neurological - screening mental status exam normal, neck supple without rigidity, cranial nerves II through XII intact, DTR's normal and symmetric Extremities - trace pedal edema noted, intact peripheral pulses  Skin - warm, dry    Data Review:    CBC  Recent Labs Lab 03/20/17 1130  WBC 8.8  HGB 9.4*  HCT 31.4*  PLT 236  MCV 87.7  MCH 26.3  MCHC 29.9*  RDW 15.4   ------------------------------------------------------------------------------------------------------------------  Chemistries   Recent Labs Lab 03/20/17 1130  NA 141  K 4.2  CL 104  CO2 27  GLUCOSE 205*  BUN 38*  CREATININE 1.93*  CALCIUM 8.8*    ------------------------------------------------------------------------------------------------------------------ estimated creatinine clearance is 20 mL/min (A) (by C-G formula based on SCr of 1.93 mg/dL (H)). ------------------------------------------------------------------------------------------------------------------ No results for input(s): TSH, T4TOTAL, T3FREE, THYROIDAB in the last 72 hours.  Invalid input(s): FREET3   Coagulation profile  Recent Labs Lab 03/20/17 1130  INR 4.04*   ------------------------------------------------------------------------------------------------------------------- No results for input(s): DDIMER in the last 72 hours. -------------------------------------------------------------------------------------------------------------------  Cardiac Enzymes  Recent Labs Lab 03/20/17 1130  TROPONINI <0.03   ------------------------------------------------------------------------------------------------------------------    Component Value Date/Time   BNP 710.5 (H) 03/20/2017 1130   ---------------------------------------------------------------------------------------------------------------  Urinalysis    Component Value Date/Time   COLORURINE YELLOW 03/20/2017 1245   APPEARANCEUR HAZY (A) 03/20/2017 1245   LABSPEC 1.009 03/20/2017 1245   PHURINE 7.0 03/20/2017 1245   GLUCOSEU NEGATIVE 03/20/2017 1245   HGBUR NEGATIVE 03/20/2017 1245   BILIRUBINUR NEGATIVE 03/20/2017 1245   KETONESUR NEGATIVE 03/20/2017 1245   PROTEINUR >=300 (A) 03/20/2017 1245   UROBILINOGEN 0.2 12/14/2014 1821   NITRITE NEGATIVE 03/20/2017 1245   LEUKOCYTESUR SMALL (A) 03/20/2017 1245   ----------------------------------------------------------------------------------------------------------------   Imaging Results:    Dg Chest 2 View  Result Date: 03/20/2017 CLINICAL DATA:  Shortness of breath. EXAM: CHEST  2 VIEW COMPARISON:  03/01/2016 FINDINGS: The  cardiac silhouette remains enlarged. There are patchy, relatively symmetric airspace opacities throughout both lungs. Tiny bilateral pleural effusions are present. No pneumothorax is identified. No acute osseous abnormality is seen. Old left rib fractures are again noted. IMPRESSION: Diffuse bilateral airspace disease which may reflect pneumonia or edema. Electronically Signed   By: Sebastian Ache M.D.   On: 03/20/2017 12:02    Radiological Exams on Admission: Dg Chest 2 View  Result Date: 03/20/2017 CLINICAL DATA:  Shortness of breath. EXAM: CHEST  2 VIEW COMPARISON:  03/01/2016 FINDINGS: The cardiac silhouette remains enlarged. There are patchy, relatively symmetric airspace opacities throughout both lungs. Tiny bilateral pleural effusions are present. No pneumothorax is identified. No acute osseous abnormality is seen. Old left rib fractures are again noted. IMPRESSION: Diffuse bilateral airspace disease which may reflect pneumonia or edema. Electronically Signed   By: Sebastian Ache M.D.   On: 03/20/2017 12:02    DVT Prophylaxis -SCD/Coumadin AM Labs Ordered, also please review Full Orders  Family Communication: Admission, patients condition and plan of care including tests being ordered have been discussed with the patient and son and daughters (x2) who indicate understanding and agree with the plan   Code Status -  DNR  Likely DC to  Home with son (prior to admission patient lived with her son)  Condition   fair  Mariea Clonts, Katleen Carraway M.D on 03/20/2017 at 4:03 PM  Between 7am to 7pm - Pager - [phone] After 7pm go to [email or web address] - password [email or web address]  [facility] - Office  [phone]  Voice Recognition Reubin Milan dictation system was used to create this note, attempts have been made to correct errors. Please contact the author with questions and/or clarifications.

## 2017-03-20 NOTE — ED Notes (Signed)
Admitting MD at bedside explained to this RN that if she has not voided by 1540 to in and out cath.

## 2017-03-21 ENCOUNTER — Inpatient Hospital Stay (HOSPITAL_COMMUNITY): Payer: Medicare Other

## 2017-03-21 ENCOUNTER — Encounter (HOSPITAL_COMMUNITY): Payer: Self-pay | Admitting: Cardiology

## 2017-03-21 DIAGNOSIS — J9611 Chronic respiratory failure with hypoxia: Secondary | ICD-10-CM

## 2017-03-21 DIAGNOSIS — I5033 Acute on chronic diastolic (congestive) heart failure: Secondary | ICD-10-CM

## 2017-03-21 DIAGNOSIS — E1165 Type 2 diabetes mellitus with hyperglycemia: Secondary | ICD-10-CM

## 2017-03-21 DIAGNOSIS — N390 Urinary tract infection, site not specified: Secondary | ICD-10-CM

## 2017-03-21 DIAGNOSIS — I509 Heart failure, unspecified: Secondary | ICD-10-CM

## 2017-03-21 DIAGNOSIS — N184 Chronic kidney disease, stage 4 (severe): Secondary | ICD-10-CM

## 2017-03-21 DIAGNOSIS — N183 Chronic kidney disease, stage 3 unspecified: Secondary | ICD-10-CM

## 2017-03-21 LAB — ECHOCARDIOGRAM COMPLETE
AV Area VTI: 1.76 cm2
AV Area mean vel: 1.64 cm2
AV Mean grad: 15 mmHg
AV Peak grad: 30 mmHg
AV area mean vel ind: 0.96 cm2/m2
AV vel: 1.69
AVAREAVTIIND: 0.99 cm2/m2
AVCELMEANRAT: 0.43
AVLVOTPG: 7 mmHg
AVPKVEL: 276 cm/s
Ao pk vel: 0.46 m/s
Ao-asc: 26 cm
CHL CUP AV PEAK INDEX: 1.03
CHL CUP DOP CALC LVOT VTI: 31.6 cm
DOP CAL AO MEAN VELOCITY: 179 cm/s
E/e' ratio: 17.58
EWDT: 165 ms
FS: 27 % — AB (ref 28–44)
HEIGHTINCHES: 62 in
IV/PV OW: 1
LA diam end sys: 41 mm
LA diam index: 2.4 cm/m2
LA vol A4C: 90.1 ml
LA vol index: 49.5 mL/m2
LA vol: 84.8 mL
LASIZE: 41 mm
LV E/e' medial: 17.58
LV E/e'average: 17.58
LV PW d: 10 mm — AB (ref 0.6–1.1)
LV TDI E'MEDIAL: 4.74
LV e' LATERAL: 7.28 cm/s
LVOT SV: 120 mL
LVOT area: 3.8 cm2
LVOT peak VTI: 0.44 cm
LVOT peak vel: 128 cm/s
LVOTD: 22 mm
MV Dec: 165
MV Peak grad: 7 mmHg
MV pk E vel: 128 m/s
MVAP: 4.58 cm2
MVPKAVEL: 60.6 m/s
MVSPHT: 48 ms
RV LATERAL S' VELOCITY: 15.3 cm/s
RV TAPSE: 28.7 mm
RV sys press: 70 mmHg
Reg peak vel: 371 cm/s
TDI e' lateral: 7.28
TRMAXVEL: 371 cm/s
VTI: 71.1 cm
Valve area index: 0.99
Valve area: 1.69 cm2
WEIGHTICAEL: 2316.8 [oz_av]

## 2017-03-21 LAB — CBC
HCT: 30 % — ABNORMAL LOW (ref 36.0–46.0)
HEMOGLOBIN: 9.2 g/dL — AB (ref 12.0–15.0)
MCH: 26.7 pg (ref 26.0–34.0)
MCHC: 30.7 g/dL (ref 30.0–36.0)
MCV: 87 fL (ref 78.0–100.0)
PLATELETS: 242 10*3/uL (ref 150–400)
RBC: 3.45 MIL/uL — AB (ref 3.87–5.11)
RDW: 15.6 % — ABNORMAL HIGH (ref 11.5–15.5)
WBC: 11.1 10*3/uL — AB (ref 4.0–10.5)

## 2017-03-21 LAB — GLUCOSE, CAPILLARY
Glucose-Capillary: 561 mg/dL (ref 65–99)
Glucose-Capillary: 583 mg/dL (ref 65–99)
Glucose-Capillary: 246 mg/dL — ABNORMAL HIGH (ref 65–99)
Glucose-Capillary: 274 mg/dL — ABNORMAL HIGH (ref 65–99)
Glucose-Capillary: 199 mg/dL — ABNORMAL HIGH (ref 65–99)

## 2017-03-21 LAB — BASIC METABOLIC PANEL
ANION GAP: 11 (ref 5–15)
BUN: 40 mg/dL — ABNORMAL HIGH (ref 6–20)
CALCIUM: 8.9 mg/dL (ref 8.9–10.3)
CHLORIDE: 100 mmol/L — AB (ref 101–111)
CO2: 27 mmol/L (ref 22–32)
Creatinine, Ser: 2.04 mg/dL — ABNORMAL HIGH (ref 0.44–1.00)
GFR calc non Af Amer: 21 mL/min — ABNORMAL LOW (ref >60)
GFR, EST AFRICAN AMERICAN: 25 mL/min — AB (ref >60)
Glucose, Bld: 377 mg/dL — ABNORMAL HIGH (ref 65–99)
Potassium: 4.4 mmol/L (ref 3.5–5.1)
SODIUM: 138 mmol/L (ref 135–145)

## 2017-03-21 LAB — PROTIME-INR
INR: 5.29
PROTHROMBIN TIME: 46.2 s — AB (ref 11.4–15.2)

## 2017-03-21 MED ORDER — INSULIN ASPART 100 UNIT/ML ~~LOC~~ SOLN
5.0000 [IU] | Freq: Three times a day (TID) | SUBCUTANEOUS | Status: DC
  Administered 2017-03-21 – 2017-03-29 (×19): 5 [IU] via SUBCUTANEOUS

## 2017-03-21 MED ORDER — METOPROLOL TARTRATE 25 MG PO TABS
25.0000 mg | ORAL_TABLET | Freq: Two times a day (BID) | ORAL | Status: DC
  Administered 2017-03-21 – 2017-03-29 (×14): 25 mg via ORAL
  Filled 2017-03-21 (×16): qty 1

## 2017-03-21 MED ORDER — INSULIN DETEMIR 100 UNIT/ML ~~LOC~~ SOLN
10.0000 [IU] | Freq: Two times a day (BID) | SUBCUTANEOUS | Status: DC
  Administered 2017-03-21 – 2017-03-22 (×3): 10 [IU] via SUBCUTANEOUS
  Filled 2017-03-21 (×3): qty 0.1

## 2017-03-21 MED ORDER — INSULIN ASPART 100 UNIT/ML ~~LOC~~ SOLN
0.0000 [IU] | Freq: Three times a day (TID) | SUBCUTANEOUS | Status: DC
  Administered 2017-03-21: 5 [IU] via SUBCUTANEOUS
  Administered 2017-03-21: 15 [IU] via SUBCUTANEOUS
  Administered 2017-03-22: 3 [IU] via SUBCUTANEOUS
  Administered 2017-03-22: 8 [IU] via SUBCUTANEOUS
  Administered 2017-03-23 – 2017-03-24 (×4): 3 [IU] via SUBCUTANEOUS
  Administered 2017-03-25 – 2017-03-26 (×3): 5 [IU] via SUBCUTANEOUS
  Administered 2017-03-26: 2 [IU] via SUBCUTANEOUS
  Administered 2017-03-27 – 2017-03-28 (×3): 5 [IU] via SUBCUTANEOUS
  Administered 2017-03-29: 3 [IU] via SUBCUTANEOUS
  Administered 2017-03-29: 5 [IU] via SUBCUTANEOUS

## 2017-03-21 MED ORDER — INSULIN ASPART 100 UNIT/ML ~~LOC~~ SOLN
0.0000 [IU] | Freq: Every day | SUBCUTANEOUS | Status: DC
  Administered 2017-03-22: 2 [IU] via SUBCUTANEOUS

## 2017-03-21 MED ORDER — METHIMAZOLE 5 MG PO TABS
5.0000 mg | ORAL_TABLET | Freq: Every day | ORAL | Status: DC
  Administered 2017-03-21 – 2017-04-08 (×19): 5 mg via ORAL
  Filled 2017-03-21 (×8): qty 1
  Filled 2017-03-21: qty 0.5
  Filled 2017-03-21 (×12): qty 1

## 2017-03-21 NOTE — Progress Notes (Signed)
Patient ID: Alexis [name], female   DOB: 09/11/1931, 83 y.o.   MRN: [id number]  PROGRESS NOTE    Alexis [name]  [id number] DOB: 10/09/1931 DOA: 03/20/2017 PCP: Ladora DanielBeal, [name], PA-C   Brief Narrative:  83 year old female with history of chronic diastolic CHF, diabetes mellitus type 2, COPD, chronic hypoxic respiratory failure on 2-3 L oxygen at home, paroxysmal atrial fibrillation on anticoagulation, hypertension, chronic kidney disease stage IV presented with worsening shortness of breath and was admitted with intravenous Lasix for CHF exacerbation.   Assessment & Plan:   Principal Problem:   Acute exacerbation of CHF (congestive heart failure) (HCC) Active Problems:   (HFpEF) heart failure with preserved ejection fraction (HCC)   Paroxysmal atrial fibrillation (HCC)   Chronic respiratory failure with hypoxia (HCC)   UTI (urinary tract infection)   Type II diabetes mellitus (HCC)  Acute on chronic diastolic heart failure - Continue intravenous Lasix. Strict input and output. Daily weights. 2-D echo. Cardiology consulted. Continue hydralazine, metoprolol and isosorbide mononitrate.  Paroxysmal atrial fibrillation -Currently rate controlled with mild bradycardia. Continue metoprolol. Coumadin is being dosed per pharmacy.  Supratherapeutic INR - INR 5.29 today. No signs of bleeding. Hold Coumadin dose. Monitor  Probable UTI - Continue Rocephin. Follow cultures.  Chronic hypoxic respiratory failure - Continue oxygen supplementation. Not wheezing currently so no need for intravenous steroids.  Hypertension - Monitor blood pressure. Continue metoprolol, hydralazine and isosorbide mononitrate and Lasix  Chronic kidney disease stage IV - Monitor creatinine.  Diabetes mellitus type 2 - Sliding-scale insulin. Hemoglobin A1c   DVT prophylaxis: Coumadin held because of supratherapeutic INR Code Status:  Full Family Communication: None at bedside Disposition Plan:  Home in 2-3 days  Consultants: Cardiology  Procedures: None  Antimicrobials: Rocephin from 03/20/2017    Subjective: Patient seen and examined at bedside. She denies overnight fever, nausea or vomiting. She is still short of breath but feels a little better.  Objective: Vitals:   03/20/17 1651 03/20/17 2005 03/21/17 0426 03/21/17 0438  BP: (!) 161/59 (!) 144/46 (!) 147/49   Pulse: (!) 104 100 (!) 58   Resp: 20 18 20    Temp: 98.5 F (36.9 C) 98.2 F (36.8 C) 97.7 F (36.5 C)   TempSrc: Oral Oral    SpO2: 93% 94%    Weight: 66 kg (145 lb 8.1 oz)   65.7 kg (144 lb 12.8 oz)  Height: 5\' 2"  (1.575 m)       Intake/Output Summary (Last 24 hours) at 03/21/17 0932 Last data filed at 03/21/17 0700  Gross per 24 hour  Intake              480 ml  Output             2000 ml  Net            -1520 ml   Filed Weights   03/20/17 1112 03/20/17 1651 03/21/17 0438  Weight: 68 kg (150 lb) 66 kg (145 lb 8.1 oz) 65.7 kg (144 lb 12.8 oz)    Examination:  General exam: Appears calm and comfortable  Respiratory system: Bilateral decreased breath sound at bases with basilar crackles Cardiovascular system: S1 & S2 heard, rate controlled  Gastrointestinal system: Abdomen is nondistended, soft and nontender. Normal bowel sounds heard. Extremities: No cyanosis, clubbing; trace edema   Data Reviewed: I have personally reviewed following labs and imaging studies  CBC:  Recent Labs Lab 03/20/17 1130 03/21/17 0506  WBC 8.8 11.1*  HGB 9.4* 9.2*  HCT 31.4* 30.0*  MCV 87.7 87.0  PLT 236 242   Basic Metabolic Panel:  Recent Labs Lab 03/20/17 1130 03/21/17 0506  NA 141 138  K 4.2 4.4  CL 104 100*  CO2 27 27  GLUCOSE 205* 377*  BUN 38* 40*  CREATININE 1.93* 2.04*  CALCIUM 8.8* 8.9   GFR: Estimated Creatinine Clearance: 18.6 mL/min (A) (by C-G formula based on SCr of 2.04 mg/dL (H)). Liver Function Tests: No results for input(s): AST, ALT, ALKPHOS, BILITOT, PROT, ALBUMIN in the  last 168 hours. No results for input(s): LIPASE, AMYLASE in the last 168 hours. No results for input(s): AMMONIA in the last 168 hours. Coagulation Profile:  Recent Labs Lab 03/20/17 1130 03/21/17 0506  INR 4.04* 5.29*   Cardiac Enzymes:  Recent Labs Lab 03/20/17 1130  TROPONINI <0.03   BNP (last 3 results) No results for input(s): PROBNP in the last 8760 hours. HbA1C: No results for input(s): HGBA1C in the last 72 hours. CBG: No results for input(s): GLUCAP in the last 168 hours. Lipid Profile: No results for input(s): CHOL, HDL, LDLCALC, TRIG, CHOLHDL, LDLDIRECT in the last 72 hours. Thyroid Function Tests: No results for input(s): TSH, T4TOTAL, FREET4, T3FREE, THYROIDAB in the last 72 hours. Anemia Panel: No results for input(s): VITAMINB12, FOLATE, FERRITIN, TIBC, IRON, RETICCTPCT in the last 72 hours. Sepsis Labs: No results for input(s): PROCALCITON, LATICACIDVEN in the last 168 hours.  No results found for this or any previous visit (from the past 240 hour(s)).       Radiology Studies: Dg Chest 2 View  Result Date: 03/20/2017 CLINICAL DATA:  Shortness of breath. EXAM: CHEST  2 VIEW COMPARISON:  03/01/2016 FINDINGS: The cardiac silhouette remains enlarged. There are patchy, relatively symmetric airspace opacities throughout both lungs. Tiny bilateral pleural effusions are present. No pneumothorax is identified. No acute osseous abnormality is seen. Old left rib fractures are again noted. IMPRESSION: Diffuse bilateral airspace disease which may reflect pneumonia or edema. Electronically Signed   By: Sebastian Ache M.D.   On: 03/20/2017 12:02        Scheduled Meds: . atorvastatin  20 mg Oral Daily  . cholecalciferol  1,000 Units Oral q morning - 10a  . escitalopram  10 mg Oral Daily  . feeding supplement (ENSURE ENLIVE)  237 mL Oral BID BM  . fenofibrate  160 mg Oral Daily  . furosemide  40 mg Intravenous Q12H  . gabapentin  100 mg Oral QHS  . guaiFENesin   600 mg Oral BID  . hydrALAZINE  75 mg Oral BID  . Influenza vac split quadrivalent PF  0.5 mL Intramuscular Tomorrow-1000  . iron polysaccharides  150 mg Oral Daily  . isosorbide mononitrate  30 mg Oral Daily  . metoprolol tartrate  12.5 mg Oral BID  . multivitamin with minerals  1 tablet Oral Daily  . ondansetron  4 mg Oral Q6H  . polyethylene glycol  17 g Oral Daily  . potassium chloride SA  20 mEq Oral Daily  . sodium chloride flush  3 mL Intravenous Q12H  . Warfarin - Pharmacist Dosing Inpatient   Does not apply q1800   Continuous Infusions: . sodium chloride    . cefTRIAXone (ROCEPHIN)  IV 1 g (03/20/17 1511)     LOS: 1 day        Glade Lloyd, MD [facility] Pager [phone]  If 7PM-7AM, please contact night-coverage [email or web address] Password [email or web address] 03/21/2017, 9:32 AM

## 2017-03-21 NOTE — Progress Notes (Signed)
ANTICOAGULATION CONSULT NOTE - Initial Consult  Pharmacy Consult for warfarin Indication: atrial fibrillation  Allergies  Allergen Reactions  . Yellow Dyes (Non-Tartrazine) Nausea And Vomiting    "deathly allergic" No other information given to explain reaction. Per Daughter- this was an IV dye. Ok with yellow coloring on Coumadin tablets.   . Codeine Hives  . Other Other (See Comments)    novocaine broke mouth out  . Penicillins Hives  . Procaine Hives    Patient Measurements: Height: 5\' 2"  (157.5 cm) Weight: 144 lb 12.8 oz (65.7 kg) IBW/kg (Calculated) : 50.1   Vital Signs: Temp: 97.7 F (36.5 C) (10/26 0426) BP: 135/48 (10/26 1019) Pulse Rate: 67 (10/26 1023)  Labs:  Recent Labs  03/20/17 1130 03/21/17 0506  HGB 9.4* 9.2*  HCT 31.4* 30.0*  PLT 236 242  LABPROT 39.8* 46.2*  INR 4.04* 5.29*  CREATININE 1.93* 2.04*  TROPONINI <0.03  --     Estimated Creatinine Clearance: 18.6 mL/min (A) (by C-G formula based on SCr of 2.04 mg/dL (H)).   Medical History: Past Medical History:  Diagnosis Date  . Anxiety   . Chronic diastolic CHF (congestive heart failure) (HCC)    a. 05/2015 Echo: EF 55-60%, mild LVH, no rwma, mild MR, mildly dil RA, mod TR, PASP .  . CKD (chronic kidney disease), stage IV (HCC)   . Confusion state   . COPD (chronic obstructive pulmonary disease) (HCC)    a. on home O2 @ 3lpm.  . Elevated troponin    a. H/o elevated trop in setting of CHF; b. 06/2013 Lexiscan MV: nl EF, mild anterior breast attenuation, no ischemia.   . Family history of adverse reaction to anesthesia   . GIB (gastrointestinal bleeding)    a. History of GIB.  Marland Kitchen Hypertensive heart disease   . Macular degeneration, bilateral   . Neuropathy   . Paroxysmal atrial fibrillation (HCC)    a. CHA2DS2VASc = 6-->coumadin.  Marland Kitchen PONV (postoperative nausea and vomiting)   . Type II diabetes mellitus (HCC)   . Varicose veins      Assessment: 85 YOF on warfarin PTA for AFib-  Home dose (as per Coumadin Clinic note on 10/12) was 4mg  daily except 6mg  on Mondays- dose was reduced to that regimen during that appointment d/t INR of 3.6 in the clinic. INR today is 5.29, increased from admission INR of 4.04. . Hgb 9.2 low/stable, plts 242 wnl.  No bleeding noted.  Goal of Therapy:  INR 2-3 Monitor platelets by anticoagulation protocol: Yes   Plan:  Hold warfarin tonight Daily INR and CBC Follow s/s bleeding  Noah Delaine, RPh Clinical Pharmacist Pager: [phone] 8a-330p [phone] 330p-1030p phone [phone] or [phone] Main pharmacy [phone] 03/21/2017 10:46 AM

## 2017-03-21 NOTE — Progress Notes (Signed)
  Echocardiogram 2D Echocardiogram has been performed.  Alexis [name] 03/21/2017, 12:52 PM

## 2017-03-21 NOTE — Evaluation (Signed)
Physical Therapy Evaluation Patient Details Name: Alexis [name] MRN: [id number] DOB: 17-Apr-1932 Today's Date: 03/21/2017   History of Present Illness  25/18 with SOB, orthopnea, weight gain, increased O2 requirement.  Patient with CHF exacerbation and UTI.   PMH:  CHF, PAF, DM, neuropathy, COPD on home O2 at 2L, HTN, CKD, anemia, macular degeneration, confusion.   Clinical Impression  Patient presents with problems listed below.  Will benefit from acute PT to maximize functional mobility prior to discharge home with family.  Recommend f/u HHPT for continued therapy at d/c.    Follow Up Recommendations Home health PT;Supervision for mobility/OOB    Equipment Recommendations  Other (comment) (Needs light-weight O2 tank to use out of house)    Recommendations for Other Services       Precautions / Restrictions Precautions Precautions: None Restrictions Weight Bearing Restrictions: No      Mobility  Bed Mobility Overal bed mobility: Needs Assistance Bed Mobility: Supine to Sit;Sit to Supine     Supine to sit: Min guard Sit to supine: Min assist   General bed mobility comments: Assist to bring LE's onto bed to return to supine.  Transfers Overall transfer level: Needs assistance Equipment used: Rolling walker (2 wheeled) Transfers: Sit to/from Stand Sit to Stand: Min guard         General transfer comment: Required repeated instructions on hand placement.  Assist for safety/balance.  Ambulation/Gait Ambulation/Gait assistance: Min guard Ambulation Distance (Feet): 20 Feet (20' x 2 with 1 seated rest) Assistive device: Rolling walker (2 wheeled) Gait Pattern/deviations: Step-through pattern;Decreased stride length;Shuffle Gait velocity: decreased Gait velocity interpretation: Below normal speed for age/gender General Gait Details: Slow, shuffling gait.   Stairs            Wheelchair Mobility    Modified Rankin (Stroke Patients Only)        Balance Overall balance assessment: Needs assistance Sitting-balance support: No upper extremity supported;Feet supported Sitting balance-Leahy Scale: Good     Standing balance support: Bilateral upper extremity supported Standing balance-Leahy Scale: Poor                               Pertinent Vitals/Pain Pain Assessment: No/denies pain    Home Living Family/patient expects to be discharged to:: Private residence Living Arrangements: Children (Son) Available Help at Discharge: Family;Available 24 hours/day Type of Home: House Home Access: Level entry     Home Layout: One level Home Equipment: Walker - 2 wheels;Walker - 4 wheels;Shower seat;Hand held shower head;Bedside commode      Prior Function Level of Independence: Independent with assistive device(s);Needs assistance   Gait / Transfers Assistance Needed: Uses rollator for gait.  ADL's / Homemaking Assistance Needed: Reports her son does the cooking, and that she does "most" of her shower on her own.        Hand Dominance   Dominant Hand: Right    Extremity/Trunk Assessment   Upper Extremity Assessment Upper Extremity Assessment: Generalized weakness    Lower Extremity Assessment Lower Extremity Assessment: Generalized weakness       Communication   Communication: HOH  Cognition Arousal/Alertness: Awake/alert Behavior During Therapy: WFL for tasks assessed/performed Overall Cognitive Status: History of cognitive impairments - at baseline                                 General Comments: Patient disoriented to time  and situation.      General Comments      Exercises     Assessment/Plan    PT Assessment Patient needs continued PT services  PT Problem List Decreased strength;Decreased activity tolerance;Decreased balance;Decreased mobility;Decreased cognition;Decreased knowledge of use of DME;Cardiopulmonary status limiting activity       PT Treatment  Interventions DME instruction;Gait training;Functional mobility training;Therapeutic activities;Patient/family education    PT Goals (Current goals can be found in the Care Plan section)  Acute Rehab PT Goals Patient Stated Goal: To return home PT Goal Formulation: With patient/family Time For Goal Achievement: 03/28/17 Potential to Achieve Goals: Good    Frequency Min 3X/week   Barriers to discharge        Co-evaluation               AM-PAC PT "6 Clicks" Daily Activity  Outcome Measure Difficulty turning over in bed (including adjusting bedclothes, sheets and blankets)?: None Difficulty moving from lying on back to sitting on the side of the bed? : A Little Difficulty sitting down on and standing up from a chair with arms (e.g., wheelchair, bedside commode, etc,.)?: A Little Help needed moving to and from a bed to chair (including a wheelchair)?: A Little Help needed walking in hospital room?: A Little Help needed climbing 3-5 steps with a railing? : A Lot 6 Click Score: 18    End of Session Equipment Utilized During Treatment: Gait belt;Oxygen Activity Tolerance: Patient limited by fatigue Patient left: in bed;with call bell/phone within reach;with family/visitor present Nurse Communication: Mobility status PT Visit Diagnosis: Unsteadiness on feet (R26.81);Muscle weakness (generalized) (M62.81)    Time: [id number] PT Time Calculation (min) (ACUTE ONLY): 21 min   Charges:   PT Evaluation $PT Eval Moderate Complexity: 1 Mod     PT G Codes:        Alexis [name], PT, [facility] Acute Rehab Services Pager [phone]   Alexis [name] 03/21/2017, 8:20 PM

## 2017-03-21 NOTE — Progress Notes (Signed)
RN reported INR 5.29. No sign of bleeding. Repeat INR this afternoon.

## 2017-03-21 NOTE — Progress Notes (Signed)
Nutrition Brief Note  Patient identified on the Malnutrition Screening Tool (MST) Report.  Wt Readings from Last 15 Encounters:  03/21/17 144 lb 12.8 oz (65.7 kg)  02/13/17 148 lb (67.1 kg)  10/10/16 142 lb 3.2 oz (64.5 kg)  09/26/16 142 lb (64.4 kg)  04/17/16 137 lb (62.1 kg)  03/01/16 146 lb 9.7 oz (66.5 kg)  02/14/16 135 lb (61.2 kg)  11/22/15 127 lb (57.6 kg)  10/23/15 124 lb 6.4 oz (56.4 kg)  10/13/15 128 lb (58.1 kg)  09/29/15 130 lb 3.2 oz (59.1 kg)  09/22/15 131 lb 1.6 oz (59.5 kg)  08/18/15 135 lb 14.4 oz (61.6 kg)  08/04/15 136 lb 11 oz (62 kg)  07/03/15 137 lb 9.1 oz (62.4 kg)   Body mass index is 26.48 kg/m. Patient meets criteria for Overweight based on current BMI.   Current diet order is Heart Healthy. Pt reports a good appetite. Pt's brother at bedside and confirms.  Labs and medications reviewed. Ensure Enlive supplement ordered BID.  Nutrition focused physical exam completed.  No muscle or subcutaneous fat depletion noticed.  No nutrition interventions warranted at this time. If nutrition issues arise, please consult RD.   Maureen Chatters, RD, LDN Pager #: [phone] After-Hours Pager #: [phone]

## 2017-03-21 NOTE — Consult Note (Signed)
Cardiology Admission History and Physical:   Patient ID: Alexis [name]; MRN: [id number]; DOB: 1932/03/05   Admission date: 03/20/2017  Primary Care Provider: [name], PA-C Primary Cardiologist: [facility]   Primary Electrophysiologist:    Chief Complaint:  Asked to see pt by Dr Hanley Ben for CHF    Patient Profile:   Alexis [name] is a 83 y.o. female with a history of Chronic diastolic CHF, PAF (CHADSVASc 6, s/p DCCV); anemia, HTN with mod LVH, HL, DM, GI bleed, COPD amd question noncompliance   Presents to ED with increased SOB     History of Present Illness:   Alexis [name] is an 83 yo who is followed by Alexis [name] in clinic  [facility] was seen in May 2018  Nuclear stress test in past showed no ischemia LVEF 70% Has been hospitalized in past for CHF   Maintained on low dose torsemide The pt presented to [facility] yesterday with a 2 to 3 day history of orthopnea, increase O2 requirement.  Denied CP  Wt had been up  Some  She denies extra salt intake  Does say she drinks water   Family member says she has been giving out easier ofer the past few months  "used to be able to get around [place] for hours". IN ED Rx with Solumedrol, Bronchodilators and IV lasix   Pt says she is breathing better    Past Medical History:  Diagnosis Date  . Anxiety   . Chronic diastolic CHF (congestive heart failure) (HCC)    a. 05/2015 Echo: EF 55-60%, mild LVH, no rwma, mild MR, mildly dil RA, mod TR, PASP .  . CKD (chronic kidney disease), stage IV (HCC)   . Confusion state   . COPD (chronic obstructive pulmonary disease) (HCC)    a. on home O2 @ 3lpm.  . Elevated troponin    a. H/o elevated trop in setting of CHF; b. 06/2013 Lexiscan MV: nl EF, mild anterior breast attenuation, no ischemia.   . Family history of adverse reaction to anesthesia   . GIB (gastrointestinal bleeding)    a. History of GIB.  Marland Kitchen Hypertensive heart disease   . Macular degeneration, bilateral   . Neuropathy   .  Paroxysmal atrial fibrillation (HCC)    a. CHA2DS2VASc = 6-->coumadin.  Marland Kitchen PONV (postoperative nausea and vomiting)   . Type II diabetes mellitus (HCC)   . Varicose veins     Past Surgical History:  Procedure Laterality Date  . ABDOMINAL HYSTERECTOMY    . CARDIOVERSION N/A 09/22/2015   Procedure: CARDIOVERSION;  Surgeon: Alexis Nose, MD;  Location: [facility] ENDOSCOPY;  Service: Cardiovascular;  Laterality: N/A;  . ESOPHAGOGASTRODUODENOSCOPY N/A 12/01/2014   Procedure: ESOPHAGOGASTRODUODENOSCOPY (EGD);  Surgeon: Alexis Fredrickson, MD;  Location: [facility] ENDOSCOPY;  Service: Endoscopy;  Laterality: N/A;  . FRACTURE SURGERY    . HIP FRACTURE SURGERY  2004  . VEIN SURGERY       Medications Prior to Admission: Prior to Admission medications   Medication Sig Start Date End Date Taking? Authorizing Provider  albuterol (PROVENTIL HFA;VENTOLIN HFA) 108 (90 BASE) MCG/ACT inhaler Inhale 2 puffs into the lungs every 6 (six) hours as needed for wheezing or shortness of breath. 06/11/13  Yes Dhungel, Nishant, MD  atorvastatin (LIPITOR) 20 MG tablet Take 20 mg by mouth daily.   Yes [provider]  cholecalciferol (VITAMIN D) 1000 UNITS tablet Take 1,000 Units by mouth every morning.    Yes [provider]  docusate  sodium (COLACE) 100 MG capsule Take 100 mg by mouth 2 (two) times daily.    Yes [provider]  escitalopram (LEXAPRO) 10 MG tablet Take 10 mg by mouth daily.   Yes [provider]  fenofibrate 160 MG tablet Take 160 mg by mouth daily.   Yes [provider]  gabapentin (NEURONTIN) 100 MG capsule Take 1 capsule (100 mg total) by mouth at bedtime. 08/05/15  Yes Alexis Bong, MD  hydrALAZINE (APRESOLINE) 50 MG tablet Take 1.5 tablets (75 mg total) by mouth 2 (two) times daily. 02/15/17  Yes Alexis Lever, MD  isosorbide mononitrate (IMDUR) 30 MG 24 hr tablet TAKE 1 TABLET BY MOUTH EVERY DAY 07/29/16  Yes Hilty, Lisette Abu, MD  methimazole (TAPAZOLE) 5 MG tablet  Take 5 mg by mouth daily. 03/18/17  Yes [provider]  metoprolol tartrate (LOPRESSOR) 25 MG tablet Take 0.5 tablets (12.5 mg total) by mouth 2 (two) times daily. 12/30/16  Yes Hilty, Lisette Abu, MD  OXYGEN Inhale 3 L into the lungs at bedtime.    Yes [provider]  polyethylene glycol (MIRALAX / GLYCOLAX) packet Take 17 g by mouth daily as needed for mild constipation.   Yes [provider]  potassium chloride SA (KLOR-CON M20) 20 MEQ tablet Take 1 tablet (20 mEq total) by mouth daily. 05/22/16  Yes Hilty, Lisette Abu, MD  torsemide (DEMADEX) 20 MG tablet Take 2 tablets (40 mg total) by mouth daily. 05/21/16  Yes Hilty, Lisette Abu, MD  warfarin (COUMADIN) 4 MG tablet TAKE 1 TO 1&1/2 TABLETS BY MOUTH AS DIRECTED BY COUMADIN CLINIC Patient taking differently: TAKE 4MG  BY MOUTH DAILY EXCEPT MONDAYS TAKE 6MG  03/13/17  Yes Hilty, Lisette Abu, MD  cephALEXin (KEFLEX) 500 MG capsule Take 1 capsule (500 mg total) by mouth every 12 (twelve) hours. Starting 9/23 Patient not taking: Reported on 03/20/2017 02/16/17   Alexis Lever, MD  iron polysaccharides (NIFEREX) 150 MG capsule Take 1 capsule (150 mg total) by mouth daily. Patient not taking: Reported on 03/20/2017 09/22/15   Rai, Delene Ruffini, MD  ondansetron (ZOFRAN) 4 MG tablet Take 1 tablet (4 mg total) by mouth every 6 (six) hours. Patient not taking: Reported on 03/20/2017 08/14/16   Alexis Crease, MD     Allergies:    Allergies  Allergen Reactions  . Yellow Dyes (Non-Tartrazine) Nausea And Vomiting    "deathly allergic" No other information given to explain reaction. Per Daughter- this was an IV dye. Ok with yellow coloring on Coumadin tablets.   . Codeine Hives  . Other Other (See Comments)    novocaine broke mouth out  . Penicillins Hives  . Procaine Hives    Social History:   Social History   Social History  . Marital status: Widowed    Spouse name: N/A  . Number of children: Y  . Years of education:  N/A   Occupational History  . retired    Social History Main Topics  . Smoking status: Passive Smoke Exposure - Never Smoker  . Smokeless tobacco: Never Used  . Alcohol use No  . Drug use: No  . Sexual activity: Not on file   Other Topics Concern  . Not on file   Social History Narrative   Originally from [place]. She has always lived in [place] & had no travel. She has significant smoke exposure through her husband for 60 years. She has no pets currently. No prior bird or mold exposure. 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 She lives in [place] with her son.    Family History:  The patient's family history includes Alzheimer's disease in her mother; Breast cancer in her mother and sister; Cancer in her brother; Colon cancer (age of onset: 33) in her father; Hypertension in her mother; Lung cancer in her brother. There is no history of Thyroid disease or Heart attack.    ROS:  Please see the history of present illness.  All other ROS reviewed and negative.     Physical Exam/Data:   Vitals:   03/21/17 0438 03/21/17 1019 03/21/17 1023 03/21/17 1125  BP:  (!) 135/48  (!) 146/21  Pulse:  (!) 55 67 (!) 56  Resp:      Temp:      TempSrc:      SpO2:  97%  95%  Weight: 144 lb 12.8 oz (65.7 kg)     Height:        Intake/Output Summary (Last 24 hours) at 03/21/17 1315 Last data filed at 03/21/17 1312  Gross per 24 hour  Intake              603 ml  Output             2000 ml  Net            -1397 ml   Filed Weights   03/20/17 1112 03/20/17 1651 03/21/17 0438  Weight: 150 lb (68 kg) 145 lb 8.1 oz (66 kg) 144 lb 12.8 oz (65.7 kg)   Body mass index is 26.48 kg/m.  General:  Well nourished, well developed, in no acute distress HEENT: normal Neck: JVP is increased   Mild R bruit  Endocrine:  No thryomegaly Vascular:R  carotid bruits; 2+ DP     Cardiac:  normal S1, S2; RRR;  Gr II/VI systolic murmur  Lungs:  clear to auscultation bilaterally, no wheezing, rhonchi or rales  Abd: soft, nontender, no hepatomegaly  Ext: no edema Musculoskeletal:  No deformities, BUE and BLE strength normal and equal Skin: warm and dry  Neuro:  CNs 2-12 intact, no focal abnormalities noted Psych:  Normal affect    EKG:  The ECG that was done  was personally reviewed and demonstrates SR 92 bpm  PR 204 msec  Borderline LVH  ST depression in the lateral leads   Relevant CV Studies:  Echo pending    Laboratory Data:  Chemistry Recent Labs Lab 03/20/17 1130 03/21/17 0506  NA 141 138  K 4.2 4.4  CL 104 100*  CO2 27 27  GLUCOSE 205* 377*  BUN 38* 40*  CREATININE 1.93* 2.04*  CALCIUM 8.8* 8.9  GFRNONAA 23* 21*  GFRAA 27* 25*  ANIONGAP 10 11    No results for input(s): PROT, ALBUMIN, AST, ALT, ALKPHOS, BILITOT in the last 168 hours. Hematology Recent Labs Lab 03/20/17 1130 03/21/17 0506  WBC 8.8 11.1*  RBC 3.58* 3.45*  HGB 9.4* 9.2*  HCT 31.4* 30.0*  MCV 87.7 87.0  MCH 26.3 26.7  MCHC 29.9* 30.7  RDW 15.4 15.6*  PLT 236 242   Cardiac Enzymes Recent Labs Lab 03/20/17 1130  TROPONINI <0.03    Recent Labs Lab 03/20/17 1143  TROPIPOC 0.00    BNP Recent Labs Lab 03/20/17 1130  BNP 710.5*    DDimer No results for input(s): DDIMER in the last 168  hours.  Radiology/Studies:  No results found.  Assessment and Plan:   1. Acute on chronic diastolic CHF   PT with known diastolic dysfunciton and CHF in the past  Now with increased SOB and fatigue  SInce admit she has diuresed Close to baseline  Exam with very mild volume increase  I would continue Rx with IV diuretic  CLoser to bseline  DIscussed 1.5 to 2 L fluid restriction and low salt ewith patient   Daily am weights   WIll review echo    2  PAF  CHADSVASC score ot 6    COntinue anticoagulation    3  ANemia  Moderate anemia (N, N)  FOllow    4  HTN  Continue home  meds  I would increase metoprolol to 25 bid     5  HL  Continue Rx    6  DM  7  COPD  On O2   8  REnal  Cr 2.0  Urin with +++ protein    Will continue to follow  Probable D/C domorrow.    For questions or updates, please contact [facility] Please consult [email or web address] for contact info under Cardiology/STEMI.    Signed, Dietrich PatesPaula Aneira Cavitt, MD  03/21/2017 1:15 PM

## 2017-03-21 NOTE — Progress Notes (Signed)
CRITICAL VALUE ALERT  Critical Value: INR 5.29  Date & Time Notied: 03/21/2017 0630  Provider Notified: Raphael Gibney  Orders Received/Actions taken: Hold coumadin for tonight

## 2017-03-21 NOTE — Progress Notes (Signed)
Inpatient Diabetes Program Recommendations  AACE/ADA: New Consensus Statement on Inpatient Glycemic Control (2015)  Target Ranges:  Prepandial:   less than 140 mg/dL      Peak postprandial:   less than 180 mg/dL (1-2 hours)      Critically ill patients:  140 - 180 mg/dL   Lab Results  Component Value Date   GLUCAP 583 (HH) 03/21/2017   HGBA1C 8.4 (H) 02/14/2017    Review of Glycemic ControlResults for ANILA, CURRIER (MRN [id number]) as of 03/21/2017 14:13  Ref. Range 03/21/2017 11:12 03/21/2017 11:23  Glucose-Capillary Latest Ref Range: 65 - 99 mg/dL 409 (HH) [facility])  Results for SHAREKIA, PLYMIRE (MRN [id number]) as of 03/21/2017 14:13  Ref. Range 03/20/2017 11:30 03/21/2017 05:06  Glucose Latest Ref Range: 65 - 99 mg/dL 213 (H) 086 (H)    Diabetes history: Type 2 DM Outpatient Diabetes medications: None- family states that glipizide was stopped due to low blood sugars Current orders for Inpatient glycemic control:  Levemir 10 units bid, Novolog 5 units tid with meals, Novolog moderate tid with meals and HS  Inpatient Diabetes Program Recommendations:    Spoke with patient and family.  They state that patient was not on any diabetes medications prior to admit stating that in the past she went too low with glipizide.  Patient was not on steroids prior to admit to the hospital, however she did receive Solumedrol 80 mg IVx1 in the ED.  This likely is the reason her blood sugars were so elevated today.  Agree with current orders, however note that there are no further steroids ordered.   -Consider reducing Levemir to 10 units once a day.   Patient states that she thinks her blood sugars are high due to the pancakes she ate this morning.  Explained that this would not cause her blood sugars to be so high, however with illness blood sugars sometimes increase.  Will follow.  Thanks, Beryl Meager, RN, BC-ADM Inpatient Diabetes Coordinator Pager [phone] (8a-5p)

## 2017-03-22 DIAGNOSIS — I480 Paroxysmal atrial fibrillation: Secondary | ICD-10-CM

## 2017-03-22 DIAGNOSIS — I5030 Unspecified diastolic (congestive) heart failure: Secondary | ICD-10-CM

## 2017-03-22 LAB — CBC WITH DIFFERENTIAL/PLATELET
BASOS ABS: 0 10*3/uL (ref 0.0–0.1)
BASOS PCT: 0 %
EOS PCT: 2 %
Eosinophils Absolute: 0.2 10*3/uL (ref 0.0–0.7)
HCT: 26.9 % — ABNORMAL LOW (ref 36.0–46.0)
Hemoglobin: 8.3 g/dL — ABNORMAL LOW (ref 12.0–15.0)
Lymphocytes Relative: 8 %
Lymphs Abs: 1 10*3/uL (ref 0.7–4.0)
MCH: 26.9 pg (ref 26.0–34.0)
MCHC: 30.9 g/dL (ref 30.0–36.0)
MCV: 87.3 fL (ref 78.0–100.0)
MONO ABS: 0.8 10*3/uL (ref 0.1–1.0)
Monocytes Relative: 6 %
NEUTROS ABS: 10.9 10*3/uL — AB (ref 1.7–7.7)
Neutrophils Relative %: 84 %
PLATELETS: 235 10*3/uL (ref 150–400)
RBC: 3.08 MIL/uL — AB (ref 3.87–5.11)
RDW: 15.4 % (ref 11.5–15.5)
WBC: 12.9 10*3/uL — AB (ref 4.0–10.5)

## 2017-03-22 LAB — GLUCOSE, CAPILLARY
GLUCOSE-CAPILLARY: 269 mg/dL — AB (ref 65–99)
GLUCOSE-CAPILLARY: 204 mg/dL — AB (ref 65–99)
Glucose-Capillary: 73 mg/dL (ref 65–99)
Glucose-Capillary: 200 mg/dL — ABNORMAL HIGH (ref 65–99)

## 2017-03-22 LAB — HEMOGLOBIN A1C
HEMOGLOBIN A1C: 8.8 % — AB (ref 4.8–5.6)
MEAN PLASMA GLUCOSE: 205.86 mg/dL

## 2017-03-22 LAB — BASIC METABOLIC PANEL
Anion gap: 10 (ref 5–15)
BUN: 56 mg/dL — ABNORMAL HIGH (ref 6–20)
CALCIUM: 8.7 mg/dL — AB (ref 8.9–10.3)
CO2: 30 mmol/L (ref 22–32)
CREATININE: 2.41 mg/dL — AB (ref 0.44–1.00)
Chloride: 99 mmol/L — ABNORMAL LOW (ref 101–111)
GFR calc non Af Amer: 17 mL/min — ABNORMAL LOW (ref >60)
GFR, EST AFRICAN AMERICAN: 20 mL/min — AB (ref >60)
GLUCOSE: 89 mg/dL (ref 65–99)
Potassium: 4.8 mmol/L (ref 3.5–5.1)
Sodium: 139 mmol/L (ref 135–145)

## 2017-03-22 LAB — PROTIME-INR
INR: 6
PROTHROMBIN TIME: 53.1 s — AB (ref 11.4–15.2)

## 2017-03-22 LAB — URINE CULTURE

## 2017-03-22 LAB — MAGNESIUM: Magnesium: 2.2 mg/dL (ref 1.7–2.4)

## 2017-03-22 MED ORDER — FUROSEMIDE 10 MG/ML IJ SOLN: 40.0000 mg | Freq: Every day | INTRAMUSCULAR | Status: DC

## 2017-03-22 MED ORDER — INSULIN DETEMIR 100 UNIT/ML ~~LOC~~ SOLN
10.0000 [IU] | Freq: Every day | SUBCUTANEOUS | Status: DC
Start: 2017-03-23 — End: 2017-03-27
  Administered 2017-03-23 – 2017-03-27 (×5): 10 [IU] via SUBCUTANEOUS
  Filled 2017-03-22 (×5): qty 0.1

## 2017-03-22 MED ORDER — CEPHALEXIN 500 MG PO CAPS
500.0000 mg | ORAL_CAPSULE | Freq: Two times a day (BID) | ORAL | Status: AC
  Administered 2017-03-22 – 2017-03-23 (×4): 500 mg via ORAL
  Filled 2017-03-22 (×4): qty 1

## 2017-03-22 NOTE — Progress Notes (Signed)
PT Cancellation Note  Patient Details Name: Alexis [name] MRN: [id number] DOB: 06-16-1931   Cancelled Treatment:    Reason Eval/Treat Not Completed: Medical issues which prohibited therapy.  INR is 6.0 this am.  Will return at later date.   [name] 03/22/2017, 9:38 AM [name]. [name], [facility] Acute Rehab Services Pager [phone]

## 2017-03-22 NOTE — Progress Notes (Signed)
Inpatient Diabetes Program Recommendations  AACE/ADA: New Consensus Statement on Inpatient Glycemic Control (2015)  Target Ranges:  Prepandial:   less than 140 mg/dL      Peak postprandial:   less than 180 mg/dL (1-2 hours)      Critically ill patients:  140 - 180 mg/dL   Lab Results  Component Value Date   GLUCAP 73 03/22/2017   HGBA1C 8.8 (H) 03/22/2017    Review of Glycemic Control Results for AJAI, MELLOTT (MRN [id number]) as of 03/22/2017 09:09  Ref. Range 03/21/2017 11:23 03/21/2017 16:36 03/21/2017 16:40 03/21/2017 20:50 03/22/2017 07:38  Glucose-Capillary Latest Ref Range: 65 - 99 mg/dL 168 (HH) 372 (H) 902 (H) 199 (H) 73    Inpatient Diabetes Program Recommendations:   Fasting CBG 73. -Consider reducing Levemir to 10 units once a day.  Text page to Dr. Hanley Ben with recommendations.  Thank you, Billy Fischer. Aariel Ems, RN, MSN, CDE  Diabetes Coordinator Inpatient Glycemic Control Team Team Pager [phone] (8am-5pm) 03/22/2017 9:10 AM

## 2017-03-22 NOTE — Progress Notes (Signed)
No new orders per NP at this time.  Brogan England, RN

## 2017-03-22 NOTE — Progress Notes (Signed)
INR this morning 6.00. Paged NP on call Lynch.  Awaiting on possible orders.  Ottie Neglia, RN

## 2017-03-22 NOTE — Progress Notes (Signed)
Progress Note  Patient Name: Alexis [name] Date of Encounter: 03/22/2017  Primary Cardiologist: Hilty  Subjective   Breathing still mildly labored but improved.  No chest pain.  Inpatient Medications    Scheduled Meds: . atorvastatin  20 mg Oral Daily  . cephALEXin  500 mg Oral Q12H  . cholecalciferol  1,000 Units Oral q morning - 10a  . escitalopram  10 mg Oral Daily  . feeding supplement (ENSURE ENLIVE)  237 mL Oral BID BM  . fenofibrate  160 mg Oral Daily  . [START ON 03/23/2017] furosemide  40 mg Intravenous Daily  . gabapentin  100 mg Oral QHS  . guaiFENesin  600 mg Oral BID  . hydrALAZINE  75 mg Oral BID  . Influenza vac split quadrivalent PF  0.5 mL Intramuscular Tomorrow-1000  . insulin aspart  0-15 Units Subcutaneous TID WC  . insulin aspart  0-5 Units Subcutaneous QHS  . insulin aspart  5 Units Subcutaneous TID WC  . [START ON 03/23/2017] insulin detemir  10 Units Subcutaneous Daily  . iron polysaccharides  150 mg Oral Daily  . isosorbide mononitrate  30 mg Oral Daily  . methimazole  5 mg Oral Daily  . metoprolol tartrate  25 mg Oral BID  . multivitamin with minerals  1 tablet Oral Daily  . ondansetron  4 mg Oral Q6H  . polyethylene glycol  17 g Oral Daily  . potassium chloride SA  20 mEq Oral Daily  . sodium chloride flush  3 mL Intravenous Q12H  . Warfarin - Pharmacist Dosing Inpatient   Does not apply q1800   Continuous Infusions: . sodium chloride     PRN Meds: sodium chloride, acetaminophen **OR** acetaminophen, albuterol, ondansetron **OR** ondansetron (ZOFRAN) IV, sodium chloride flush, traZODone   Vital Signs    Vitals:   03/22/17 0201 03/22/17 0554 03/22/17 0637 03/22/17 1136  BP: (!) 140/48 (!) 141/50  (!) 123/45  Pulse: (!) 51 (!) 58  (!) 53  Resp: 18   16  Temp: 97.6 F (36.4 C) (!) 97.4 F (36.3 C)  97.6 F (36.4 C)  TempSrc: Oral Oral  Oral  SpO2: 98% 97%  96%  Weight:   147 lb 4.8 oz (66.8 kg)   Height:         Intake/Output Summary (Last 24 hours) at 03/22/17 1215 Last data filed at 03/22/17 0949  Gross per 24 hour  Intake              920 ml  Output              825 ml  Net               95 ml   Filed Weights   03/20/17 1651 03/21/17 0438 03/22/17 0637  Weight: 145 lb 8.1 oz (66 kg) 144 lb 12.8 oz (65.7 kg) 147 lb 4.8 oz (66.8 kg)    Telemetry    Normal sinus rhythm- Personally Reviewed  ECG    Sinus rhythm 92 bpm borderline LVH, ST depression noted in the lateral leads- Personally Reviewed  Physical Exam   GEN: No acute distress.   Neck: No JVD, mild right-sided bruit Cardiac: RRR, 2/6 systolic murmurs, no rubs, or gallops.  Respiratory: Clear to auscultation bilaterally. GI: Soft, nontender, non-distended  MS: No edema; No deformity. Neuro:  Nonfocal  Psych: Normal affect, memory loss her daughter [employer] Recent Labs Lab 03/20/17 1130 03/21/17 4270 03/22/17 0518  NA 141 138 139  K 4.2 4.4 4.8  CL 104 100* 99*  CO2 27 27 30   GLUCOSE 205* 377* 89  BUN 38* 40* 56*  CREATININE 1.93* 2.04* 2.41*  CALCIUM 8.8* 8.9 8.7*  GFRNONAA 23* 21* 17*  GFRAA 27* 25* 20*  ANIONGAP 10 11 10      Hematology Recent Labs Lab 03/20/17 1130 03/21/17 0506 03/22/17 0518  WBC 8.8 11.1* 12.9*  RBC 3.58* 3.45* 3.08*  HGB 9.4* 9.2* 8.3*  HCT 31.4* 30.0* 26.9*  MCV 87.7 87.0 87.3  MCH 26.3 26.7 26.9  MCHC 29.9* 30.7 30.9  RDW 15.4 15.6* 15.4  PLT 236 242 235    Cardiac Enzymes Recent Labs Lab 03/20/17 1130  TROPONINI <0.03    Recent Labs Lab 03/20/17 1143  TROPIPOC 0.00     BNP Recent Labs Lab 03/20/17 1130  BNP 710.5*     DDimer No results for input(s): DDIMER in the last 168 hours.   Radiology    No results found.  Cardiac Studies   Echocardiogram 03/21/17: - Left ventricle: The cavity size was normal. Wall thickness was   normal. Systolic function was vigorous. The estimated ejection   fraction was in the range of 65% to 70%.  Wall motion was normal;   there were no regional wall motion abnormalities. Features are   consistent with a pseudonormal left ventricular filling pattern,   with concomitant abnormal relaxation and increased filling   pressure (grade 2 diastolic dysfunction). - Aortic valve: Trileaflet; moderately thickened, moderately   calcified leaflets. There was mild stenosis. Peak velocity (S):   276 cm/s. Mean gradient (S): 15 mm Hg. Valve area (VTI): 1.69   cm^2. Valve area (Vmax): 1.76 cm^2. Valve area (Vmean): 1.64   cm^2. - Mitral valve: Moderately calcified annulus. Mildly thickened   leaflets . There was moderate regurgitation. - Left atrium: The atrium was moderately to severely dilated.   Volume/bsa, S: 49.5 ml/m^2. - Right atrium: The atrium was moderately dilated. - Tricuspid valve: There was moderate regurgitation. - Pulmonary arteries: Systolic pressure was severely increased. PA   peak pressure: 70 mm Hg (S).  Impressions:  - Compared to the prior study, there has been no significant   interval change.  Patient Profile     83 y.o. female admitted with acute on chronic diastolic heart failure, increased shortness of breath, fatigue  Assessment & Plan    Acute on chronic diastolic heart failure - Net fluid out approximately 1.4 L, creatinine increased from 1.93-2.41 BUN increased from 38-56.  Original BNP 710, troponin normal. -With rising creatinine, I would hold off further IV diuresis. - Prior nuclear stress test in 2015 showed no ischemia.   COPD exacerbation -Solu-Medrol bronchodilators were administered.  This is likely what is driving her increasing white count. - No longer on intravenous steroids.  She has chronic home oxygen use.  Paroxysmal atrial fibrillation -CHADSVASc score 6.  Has had cardioversion in the past. -Chronic anticoagulation with Coumadin, INR is rising.  Currently on hold.  No bleeding.  Note she does have a history of GI bleeding.  Mild aortic  stenosis -Should be of no clinical consequence at this point.  For questions or updates, please contact [facility] Please consult [email or web address] for contact info under Cardiology/STEMI.      Signed, Donato SchultzMark Kivon Aprea, MD  03/22/2017, 12:15 PM

## 2017-03-22 NOTE — Progress Notes (Signed)
ANTICOAGULATION CONSULT NOTE   Pharmacy Consult for Warfarin Indication: atrial fibrillation  Allergies  Allergen Reactions  . Yellow Dyes (Non-Tartrazine) Nausea And Vomiting    "deathly allergic" No other information given to explain reaction. Per Daughter- this was an IV dye. Ok with yellow coloring on Coumadin tablets.   . Codeine Hives  . Other Other (See Comments)    novocaine broke mouth out  . Penicillins Hives  . Procaine Hives    Labs:  Recent Labs  03/20/17 1130 03/21/17 0506 03/22/17 0518  HGB 9.4* 9.2* 8.3*  HCT 31.4* 30.0* 26.9*  PLT 236 242 235  LABPROT 39.8* 46.2* 53.1*  INR 4.04* 5.29* 6.00*  CREATININE 1.93* 2.04* 2.41*  TROPONINI <0.03  --   --     Estimated Creatinine Clearance: 15.9 mL/min (A) (by C-G formula based on SCr of 2.41 mg/dL (H)).   Assessment: 43 YOF on warfarin PTA for AFib- Home dose (as per Coumadin Clinic note on 10/12) was 4mg  daily except 6mg  on Mondays- dose was reduced to that regimen during that appointment d/t INR of 3.6 in the clinic. INR continues to trend up = 6 today  Goal of Therapy:  INR 2-3 Monitor platelets by anticoagulation protocol: Yes   Plan:  Hold warfarin tonight Daily INR and CBC Follow s/s bleeding  Thank you Okey Regal, PharmD [phone] 03/22/2017 8:35 AM

## 2017-03-22 NOTE — Progress Notes (Signed)
Patient ID: Alexis [name], female   DOB: Aug 24, 1931, 83 y.o.   MRN: [id number]  PROGRESS NOTE    KANA REIMANN  AOZ:[id number] DOB: Apr 05, 1932 DOA: 03/20/2017 PCP: [name], PA-C   Brief Narrative:  83 year old female with history of chronic diastolic CHF, diabetes mellitus type 2, COPD, chronic hypoxic respiratory failure on 2-3 L oxygen at home, paroxysmal atrial fibrillation on anticoagulation, hypertension, chronic kidney disease stage IV presented with worsening shortness of breath and was admitted with intravenous Lasix for CHF exacerbation.   Assessment & Plan:   Principal Problem:   CHF (congestive heart failure), NYHA class I, unspecified failure chronicity, combined (HCC) Active Problems:   (HFpEF) heart failure with preserved ejection fraction (HCC)   Paroxysmal atrial fibrillation (HCC)   Chronic respiratory failure with hypoxia (HCC)   UTI (urinary tract infection)   Type II diabetes mellitus (HCC)   Chronic renal insufficiency, stage 3 (moderate) (HCC)  Acute on chronic diastolic heart failure - Increase Lasix to 40 mg IV daily because of slight bump in creatinine. Strict input and output. Negative fluid balance of 1742 mL since admission. Daily weights. Echo showed ejection fraction of 65-70% with grade 2 diastolic dysfunction. Cardiology following. Continue hydralazine, metoprolol and isosorbide mononitrate.  Paroxysmal atrial fibrillation -Currently rate controlled with mild bradycardia. Continue metoprolol. Coumadin is being dosed per pharmacy.  Supratherapeutic INR - INR 6 today. No signs of bleeding. Hold Coumadin dose. Monitor  Probable UTI - Escherichia coli growing in urine cultures. Change Rocephin to Keflex   Chronic hypoxic respiratory failure - Continue oxygen supplementation. Not wheezing currently so no need for intravenous steroids.  Hypertension - Monitor blood pressure. Continue metoprolol, hydralazine and isosorbide mononitrate  and Lasix  Chronic kidney disease stage IV - Creatinine is 2.41 today. Repeat a.m. creatinine. Decrease Lasix to daily  Diabetes mellitus type 2 uncontrolled with hyperglycemia -  Hemoglobin A1c 8.8. Patient is not on any oral medications at home for some reason. She was started on Levemir 10 units subcutaneous twice a day yesterday along with sliding scale insulin. Decrease dose of Levemir to 10 units subcutaneous daily and continue sliding scale insulin. Appreciate diabetes coordinator evaluation and follow-up  DVT prophylaxis: Coumadin held because of supratherapeutic INR Code Status:  Full Family Communication: None at bedside Disposition Plan: Home in 1-3 days  Consultants: Cardiology  Procedures:  Study Conclusions  - Left ventricle: The cavity size was normal. Wall thickness was   normal. Systolic function was vigorous. The estimated ejection   fraction was in the range of 65% to 70%. Wall motion was normal;   there were no regional wall motion abnormalities. Features are   consistent with a pseudonormal left ventricular filling pattern,   with concomitant abnormal relaxation and increased filling   pressure (grade 2 diastolic dysfunction). - Aortic valve: Trileaflet; moderately thickened, moderately   calcified leaflets. There was mild stenosis. Peak velocity (S):   276 cm/s. Mean gradient (S): 15 mm Hg. Valve area (VTI): 1.69   cm^2. Valve area (Vmax): 1.76 cm^2. Valve area (Vmean): 1.64   cm^2. - Mitral valve: Moderately calcified annulus. Mildly thickened   leaflets . There was moderate regurgitation. - Left atrium: The atrium was moderately to severely dilated.   Volume/bsa, S: 49.5 ml/m^2. - Right atrium: The atrium was moderately dilated. - Tricuspid valve: There was moderate regurgitation. - Pulmonary arteries: Systolic pressure was severely increased. PA   peak pressure: 70 mm Hg (S).  Impressions:  - Compared to the prior  study, there has been no  significant   interval change.  Antimicrobials: Rocephin from 03/20/2017    Subjective: Patient seen and examined at bedside. She denies overnight fever, nausea or vomiting. She feels better. She is a poor historian. No current chest pain   Objective: Vitals:   03/21/17 2151 03/22/17 0201 03/22/17 0554 03/22/17 0637  BP: (!) 120/41 (!) 140/48 (!) 141/50   Pulse: 64 (!) 51 (!) 58   Resp:  18    Temp:  97.6 F (36.4 C) (!) 97.4 F (36.3 C)   TempSrc:  Oral Oral   SpO2:  98% 97%   Weight:    66.8 kg (147 lb 4.8 oz)  Height:        Intake/Output Summary (Last 24 hours) at 03/22/17 0943 Last data filed at 03/22/17 0603  Gross per 24 hour  Intake              603 ml  Output              825 ml  Net             -222 ml   Filed Weights   03/20/17 1651 03/21/17 0438 03/22/17 0637  Weight: 66 kg (145 lb 8.1 oz) 65.7 kg (144 lb 12.8 oz) 66.8 kg (147 lb 4.8 oz)    Examination:  General exam: Appears calm and comfortable  Respiratory system: Bilateral decreased breath sound at bases with basilar crackles; not tachypneic Cardiovascular system: S1 & S2 heard, rate controlled  Gastrointestinal system: Abdomen is nondistended, soft and nontender. Normal bowel sounds heard. Extremities: No cyanosis, clubbing; trace edema   Data Reviewed: I have personally reviewed following labs and imaging studies  CBC:  Recent Labs Lab 03/20/17 1130 03/21/17 0506 03/22/17 0518  WBC 8.8 11.1* 12.9*  NEUTROABS  --   --  10.9*  HGB 9.4* 9.2* 8.3*  HCT 31.4* 30.0* 26.9*  MCV 87.7 87.0 87.3  PLT 236 242 235   Basic Metabolic Panel:  Recent Labs Lab 03/20/17 1130 03/21/17 0506 03/22/17 0518  NA 141 138 139  K 4.2 4.4 4.8  CL 104 100* 99*  CO2 27 27 30   GLUCOSE 205* 377* 89  BUN 38* 40* 56*  CREATININE 1.93* 2.04* 2.41*  CALCIUM 8.8* 8.9 8.7*  MG  --   --  2.2   GFR: Estimated Creatinine Clearance: 15.9 mL/min (A) (by C-G formula based on SCr of 2.41 mg/dL (H)). Liver  Function Tests: No results for input(s): AST, ALT, ALKPHOS, BILITOT, PROT, ALBUMIN in the last 168 hours. No results for input(s): LIPASE, AMYLASE in the last 168 hours. No results for input(s): AMMONIA in the last 168 hours. Coagulation Profile:  Recent Labs Lab 03/20/17 1130 03/21/17 0506 03/22/17 0518  INR 4.04* 5.29* 6.00*   Cardiac Enzymes:  Recent Labs Lab 03/20/17 1130  TROPONINI <0.03   BNP (last 3 results) No results for input(s): PROBNP in the last 8760 hours. HbA1C:  Recent Labs  03/22/17 0518  HGBA1C 8.8*   CBG:  Recent Labs Lab 03/21/17 1123 03/21/17 1636 03/21/17 1640 03/21/17 2050 03/22/17 0738  GLUCAP 583* 274* 246* 199* 73   Lipid Profile: No results for input(s): CHOL, HDL, LDLCALC, TRIG, CHOLHDL, LDLDIRECT in the last 72 hours. Thyroid Function Tests: No results for input(s): TSH, T4TOTAL, FREET4, T3FREE, THYROIDAB in the last 72 hours. Anemia Panel: No results for input(s): VITAMINB12, FOLATE, FERRITIN, TIBC, IRON, RETICCTPCT in the last 72 hours. Sepsis Labs: No results for  input(s): PROCALCITON, LATICACIDVEN in the last 168 hours.  Recent Results (from the past 240 hour(s))  Urine Culture     Status: Abnormal   Collection Time: 03/20/17 12:40 PM  Result Value Ref Range Status   Specimen Description URINE, RANDOM  Final   Special Requests NONE  Final   Culture >=100,000 COLONIES/mL ESCHERICHIA COLI (A)  Final   Report Status 03/22/2017 FINAL  Final   Organism ID, Bacteria ESCHERICHIA COLI (A)  Final      Susceptibility   Escherichia coli - MIC*    AMPICILLIN <=2 SENSITIVE Sensitive     CEFAZOLIN <=4 SENSITIVE Sensitive     CEFTRIAXONE <=1 SENSITIVE Sensitive     CIPROFLOXACIN <=0.25 SENSITIVE Sensitive     GENTAMICIN <=1 SENSITIVE Sensitive     IMIPENEM <=0.25 SENSITIVE Sensitive     NITROFURANTOIN 256 RESISTANT Resistant     TRIMETH/SULFA <=20 SENSITIVE Sensitive     AMPICILLIN/SULBACTAM <=2 SENSITIVE Sensitive     PIP/TAZO  <=4 SENSITIVE Sensitive     Extended ESBL NEGATIVE Sensitive     * >=100,000 COLONIES/mL ESCHERICHIA COLI         Radiology Studies: Dg Chest 2 View  Result Date: 03/20/2017 CLINICAL DATA:  Shortness of breath. EXAM: CHEST  2 VIEW COMPARISON:  03/01/2016 FINDINGS: The cardiac silhouette remains enlarged. There are patchy, relatively symmetric airspace opacities throughout both lungs. Tiny bilateral pleural effusions are present. No pneumothorax is identified. No acute osseous abnormality is seen. Old left rib fractures are again noted. IMPRESSION: Diffuse bilateral airspace disease which may reflect pneumonia or edema. Electronically Signed   By: Sebastian AcheAllen  Grady M.D.   On: 03/20/2017 12:02        Scheduled Meds: . atorvastatin  20 mg Oral Daily  . cholecalciferol  1,000 Units Oral q morning - 10a  . escitalopram  10 mg Oral Daily  . feeding supplement (ENSURE ENLIVE)  237 mL Oral BID BM  . fenofibrate  160 mg Oral Daily  . furosemide  40 mg Intravenous Q12H  . gabapentin  100 mg Oral QHS  . guaiFENesin  600 mg Oral BID  . hydrALAZINE  75 mg Oral BID  . Influenza vac split quadrivalent PF  0.5 mL Intramuscular Tomorrow-1000  . insulin aspart  0-15 Units Subcutaneous TID WC  . insulin aspart  0-5 Units Subcutaneous QHS  . insulin aspart  5 Units Subcutaneous TID WC  . insulin detemir  10 Units Subcutaneous BID  . iron polysaccharides  150 mg Oral Daily  . isosorbide mononitrate  30 mg Oral Daily  . methimazole  5 mg Oral Daily  . metoprolol tartrate  25 mg Oral BID  . multivitamin with minerals  1 tablet Oral Daily  . ondansetron  4 mg Oral Q6H  . polyethylene glycol  17 g Oral Daily  . potassium chloride SA  20 mEq Oral Daily  . sodium chloride flush  3 mL Intravenous Q12H  . Warfarin - Pharmacist Dosing Inpatient   Does not apply q1800   Continuous Infusions: . sodium chloride    . cefTRIAXone (ROCEPHIN)  IV 1 g (03/21/17 1743)     LOS: 2 days        Glade LloydKshitiz  Dazani Norby, MD [facility] Pager [phone]  If 7PM-7AM, please contact night-coverage [email or web address] Password [email or web address] 03/22/2017, 9:43 AM

## 2017-03-23 DIAGNOSIS — I5040 Unspecified combined systolic (congestive) and diastolic (congestive) heart failure: Secondary | ICD-10-CM

## 2017-03-23 DIAGNOSIS — D649 Anemia, unspecified: Secondary | ICD-10-CM

## 2017-03-23 LAB — CBC WITH DIFFERENTIAL/PLATELET
Basophils Absolute: 0 10*3/uL (ref 0.0–0.1)
Basophils Relative: 1 %
EOS PCT: 6 %
Eosinophils Absolute: 0.6 10*3/uL (ref 0.0–0.7)
HCT: 26.9 % — ABNORMAL LOW (ref 36.0–46.0)
Hemoglobin: 8 g/dL — ABNORMAL LOW (ref 12.0–15.0)
LYMPHS ABS: 0.9 10*3/uL (ref 0.7–4.0)
LYMPHS PCT: 11 %
MCH: 26.4 pg (ref 26.0–34.0)
MCHC: 29.7 g/dL — AB (ref 30.0–36.0)
MCV: 88.8 fL (ref 78.0–100.0)
MONO ABS: 0.8 10*3/uL (ref 0.1–1.0)
MONOS PCT: 9 %
Neutro Abs: 6.4 10*3/uL (ref 1.7–7.7)
Neutrophils Relative %: 73 %
PLATELETS: 243 10*3/uL (ref 150–400)
RBC: 3.03 MIL/uL — ABNORMAL LOW (ref 3.87–5.11)
RDW: 15.4 % (ref 11.5–15.5)
WBC: 8.7 10*3/uL (ref 4.0–10.5)

## 2017-03-23 LAB — BASIC METABOLIC PANEL
Anion gap: 9 (ref 5–15)
BUN: 72 mg/dL — AB (ref 6–20)
CALCIUM: 8.4 mg/dL — AB (ref 8.9–10.3)
CO2: 31 mmol/L (ref 22–32)
Chloride: 98 mmol/L — ABNORMAL LOW (ref 101–111)
Creatinine, Ser: 3.26 mg/dL — ABNORMAL HIGH (ref 0.44–1.00)
GFR calc Af Amer: 14 mL/min — ABNORMAL LOW (ref >60)
GFR, EST NON AFRICAN AMERICAN: 12 mL/min — AB (ref >60)
GLUCOSE: 236 mg/dL — AB (ref 65–99)
POTASSIUM: 5.2 mmol/L — AB (ref 3.5–5.1)
Sodium: 138 mmol/L (ref 135–145)

## 2017-03-23 LAB — GLUCOSE, CAPILLARY
GLUCOSE-CAPILLARY: 171 mg/dL — AB (ref 65–99)
GLUCOSE-CAPILLARY: 185 mg/dL — AB (ref 65–99)
Glucose-Capillary: 155 mg/dL — ABNORMAL HIGH (ref 65–99)
Glucose-Capillary: 73 mg/dL (ref 65–99)

## 2017-03-23 LAB — MAGNESIUM: Magnesium: 2.4 mg/dL (ref 1.7–2.4)

## 2017-03-23 LAB — PROTIME-INR
INR: 3.69
Prothrombin Time: 36.3 seconds — ABNORMAL HIGH (ref 11.4–15.2)

## 2017-03-23 MED ORDER — SODIUM CHLORIDE 0.9 % IV SOLN
INTRAVENOUS | Status: DC
  Administered 2017-03-23 – 2017-03-25 (×2): via INTRAVENOUS

## 2017-03-23 NOTE — Progress Notes (Signed)
Patient ID: Alexis [name], female   DOB: 12-05-1931, 83 y.o.   MRN: [id number]  PROGRESS NOTE    Alexis [name]  XBJ:[id number] DOB: 11/26/1931 DOA: 03/20/2017 PCP: [name], PA-C   Brief Narrative:  83 year old female with history of chronic diastolic CHF, diabetes mellitus type 2, COPD, chronic hypoxic respiratory failure on 2-3 L oxygen at home, paroxysmal atrial fibrillation on anticoagulation, hypertension, chronic kidney disease stage IV presented with worsening shortness of breath and was admitted with intravenous Lasix for CHF exacerbation.   Assessment & Plan:   Principal Problem:   CHF (congestive heart failure), NYHA class I, unspecified failure chronicity, combined (HCC) Active Problems:   (HFpEF) heart failure with preserved ejection fraction (HCC)   Paroxysmal atrial fibrillation (HCC)   Chronic respiratory failure with hypoxia (HCC)   UTI (urinary tract infection)   Type II diabetes mellitus (HCC)   Chronic renal insufficiency, stage 3 (moderate) (HCC)  Acute on chronic diastolic heart failure - Lasix discontinued because of bump in creatinine. Lasix on hold for now. Repeat creatinine for tomorrow. Strict input and output. Negative fluid balance of 1742 mL since admission. Daily weights. Echo showed ejection fraction of 65-70% with grade 2 diastolic dysfunction. Cardiology following. Continue hydralazine, metoprolol and isosorbide mononitrate.  Paroxysmal atrial fibrillation -Currently rate controlled with mild bradycardia. Continue metoprolol. Coumadin is being dosed per pharmacy.  Supratherapeutic INR - Improving. No signs of bleeding. Hold Coumadin dose. Monitor  Probable UTI/probable cystitis - Escherichia coli growing in urine cultures. Stop antibiotics after today's doses  Chronic hypoxic respiratory failure - Continue oxygen supplementation. Not wheezing currently so no need for intravenous steroids.  Hypertension - Monitor blood pressure.  Continue metoprolol, hydralazine and isosorbide mononitrate\  Acute kidney injury on Chronic kidney disease stage IV - Creatinine is 3.26 today. Repeat a.m. creatinine. Hold Lasix  Diabetes mellitus type 2 uncontrolled with hyperglycemia -  Hemoglobin A1c 8.8. Patient is not on any oral medications at home for some reason.  - Continue Levemir and NovoLog along with sliding scale insulin  Chronic anemia probably secondary anemia of chronic disease from chronic kidney disease - Stable. Monitor. No signs of bleeding  DVT prophylaxis: Coumadin held because of supratherapeutic INR Code Status:  Full Family Communication: None at bedside Disposition Plan: Home in 1-3 days  Consultants: Cardiology  Procedures:  Study Conclusions  - Left ventricle: The cavity size was normal. Wall thickness was   normal. Systolic function was vigorous. The estimated ejection   fraction was in the range of 65% to 70%. Wall motion was normal;   there were no regional wall motion abnormalities. Features are   consistent with a pseudonormal left ventricular filling pattern,   with concomitant abnormal relaxation and increased filling   pressure (grade 2 diastolic dysfunction). - Aortic valve: Trileaflet; moderately thickened, moderately   calcified leaflets. There was mild stenosis. Peak velocity (S):   276 cm/s. Mean gradient (S): 15 mm Hg. Valve area (VTI): 1.69   cm^2. Valve area (Vmax): 1.76 cm^2. Valve area (Vmean): 1.64   cm^2. - Mitral valve: Moderately calcified annulus. Mildly thickened   leaflets . There was moderate regurgitation. - Left atrium: The atrium was moderately to severely dilated.   Volume/bsa, S: 49.5 ml/m^2. - Right atrium: The atrium was moderately dilated. - Tricuspid valve: There was moderate regurgitation. - Pulmonary arteries: Systolic pressure was severely increased. PA   peak pressure: 70 mm Hg (S).  Impressions:  - Compared to the prior study, there has been  no  significant   interval change.  Antimicrobials: Rocephin from 03/20/2017-03/22/2017 Keflex from 03/22/2017 onwards   Subjective: Patient seen and examined at bedside. She denies overnight fever, nausea or vomiting. She is a poor historian. No current chest pain   Objective: Vitals:   03/22/17 1136 03/22/17 1952 03/22/17 2117 03/23/17 0347  BP: (!) 123/45 (!) 96/57 (!) 113/34 (!) 145/43  Pulse: (!) 53 68 63 68  Resp: 16 16  18   Temp: 97.6 F (36.4 C) 98.2 F (36.8 C)  98 F (36.7 C)  TempSrc: Oral Oral  Oral  SpO2: 96% 94%  96%  Weight:    67.9 kg (149 lb 9.6 oz)  Height:        Intake/Output Summary (Last 24 hours) at 03/23/17 0939 Last data filed at 03/23/17 0933  Gross per 24 hour  Intake             1040 ml  Output              920 ml  Net              120 ml   Filed Weights   03/21/17 0438 03/22/17 0637 03/23/17 0347  Weight: 65.7 kg (144 lb 12.8 oz) 66.8 kg (147 lb 4.8 oz) 67.9 kg (149 lb 9.6 oz)    Examination:  General exam: Appears calm and comfortable  Respiratory system: Bilateral decreased breath sound at bases with basilar crackles Cardiovascular system: S1 & S2 heard, rate controlled  Gastrointestinal system: Abdomen is nondistended, soft and nontender. Normal bowel sounds heard. Extremities: No cyanosis, clubbing; trace edema   Data Reviewed: I have personally reviewed following labs and imaging studies  CBC:  Recent Labs Lab 03/20/17 1130 03/21/17 0506 03/22/17 0518 03/23/17 0404  WBC 8.8 11.1* 12.9* 8.7  NEUTROABS  --   --  10.9* 6.4  HGB 9.4* 9.2* 8.3* 8.0*  HCT 31.4* 30.0* 26.9* 26.9*  MCV 87.7 87.0 87.3 88.8  PLT 236 242 235 243   Basic Metabolic Panel:  Recent Labs Lab 03/20/17 1130 03/21/17 0506 03/22/17 0518 03/23/17 0404  NA 141 138 139 138  K 4.2 4.4 4.8 5.2*  CL 104 100* 99* 98*  CO2 27 27 30 31   GLUCOSE 205* 377* 89 236*  BUN 38* 40* 56* 72*  CREATININE 1.93* 2.04* 2.41* 3.26*  CALCIUM 8.8* 8.9 8.7* 8.4*  MG   --   --  2.2 2.4   GFR: Estimated Creatinine Clearance: 11.8 mL/min (A) (by C-G formula based on SCr of 3.26 mg/dL (H)). Liver Function Tests: No results for input(s): AST, ALT, ALKPHOS, BILITOT, PROT, ALBUMIN in the last 168 hours. No results for input(s): LIPASE, AMYLASE in the last 168 hours. No results for input(s): AMMONIA in the last 168 hours. Coagulation Profile:  Recent Labs Lab 03/20/17 1130 03/21/17 0506 03/22/17 0518 03/23/17 0404  INR 4.04* 5.29* 6.00* 3.69   Cardiac Enzymes:  Recent Labs Lab 03/20/17 1130  TROPONINI <0.03   BNP (last 3 results) No results for input(s): PROBNP in the last 8760 hours. HbA1C:  Recent Labs  03/22/17 0518  HGBA1C 8.8*   CBG:  Recent Labs Lab 03/22/17 0738 03/22/17 1134 03/22/17 1618 03/22/17 2040 03/23/17 0742  GLUCAP 73 200* 269* 204* 155*   Lipid Profile: No results for input(s): CHOL, HDL, LDLCALC, TRIG, CHOLHDL, LDLDIRECT in the last 72 hours. Thyroid Function Tests: No results for input(s): TSH, T4TOTAL, FREET4, T3FREE, THYROIDAB in the last 72 hours. Anemia Panel: No results  for input(s): VITAMINB12, FOLATE, FERRITIN, TIBC, IRON, RETICCTPCT in the last 72 hours. Sepsis Labs: No results for input(s): PROCALCITON, LATICACIDVEN in the last 168 hours.  Recent Results (from the past 240 hour(s))  Urine Culture     Status: Abnormal   Collection Time: 03/20/17 12:40 PM  Result Value Ref Range Status   Specimen Description URINE, RANDOM  Final   Special Requests NONE  Final   Culture >=100,000 COLONIES/mL ESCHERICHIA COLI (A)  Final   Report Status 03/22/2017 FINAL  Final   Organism ID, Bacteria ESCHERICHIA COLI (A)  Final      Susceptibility   Escherichia coli - MIC*    AMPICILLIN <=2 SENSITIVE Sensitive     CEFAZOLIN <=4 SENSITIVE Sensitive     CEFTRIAXONE <=1 SENSITIVE Sensitive     CIPROFLOXACIN <=0.25 SENSITIVE Sensitive     GENTAMICIN <=1 SENSITIVE Sensitive     IMIPENEM <=0.25 SENSITIVE Sensitive      NITROFURANTOIN 256 RESISTANT Resistant     TRIMETH/SULFA <=20 SENSITIVE Sensitive     AMPICILLIN/SULBACTAM <=2 SENSITIVE Sensitive     PIP/TAZO <=4 SENSITIVE Sensitive     Extended ESBL NEGATIVE Sensitive     * >=100,000 COLONIES/mL ESCHERICHIA COLI         Radiology Studies: No results found.      Scheduled Meds: . atorvastatin  20 mg Oral Daily  . cephALEXin  500 mg Oral Q12H  . cholecalciferol  1,000 Units Oral q morning - 10a  . escitalopram  10 mg Oral Daily  . feeding supplement (ENSURE ENLIVE)  237 mL Oral BID BM  . fenofibrate  160 mg Oral Daily  . gabapentin  100 mg Oral QHS  . guaiFENesin  600 mg Oral BID  . hydrALAZINE  75 mg Oral BID  . insulin aspart  0-15 Units Subcutaneous TID WC  . insulin aspart  0-5 Units Subcutaneous QHS  . insulin aspart  5 Units Subcutaneous TID WC  . insulin detemir  10 Units Subcutaneous Daily  . iron polysaccharides  150 mg Oral Daily  . isosorbide mononitrate  30 mg Oral Daily  . methimazole  5 mg Oral Daily  . metoprolol tartrate  25 mg Oral BID  . multivitamin with minerals  1 tablet Oral Daily  . ondansetron  4 mg Oral Q6H  . polyethylene glycol  17 g Oral Daily  . potassium chloride SA  20 mEq Oral Daily  . sodium chloride flush  3 mL Intravenous Q12H  . Warfarin - Pharmacist Dosing Inpatient   Does not apply q1800   Continuous Infusions: . sodium chloride       LOS: 3 days        Glade Lloyd, MD [facility] Pager [phone]  If 7PM-7AM, please contact night-coverage [email or web address] Password [email or web address] 03/23/2017, 9:39 AM

## 2017-03-23 NOTE — Progress Notes (Signed)
ANTICOAGULATION CONSULT NOTE   Pharmacy Consult for Warfarin Indication: atrial fibrillation  Allergies  Allergen Reactions  . Yellow Dyes (Non-Tartrazine) Nausea And Vomiting    "deathly allergic" No other information given to explain reaction. Per Daughter- this was an IV dye. Ok with yellow coloring on Coumadin tablets.   . Codeine Hives  . Other Other (See Comments)    novocaine broke mouth out  . Penicillins Hives  . Procaine Hives    Labs:  Recent Labs  03/21/17 0506 03/22/17 0518 03/23/17 0404  HGB 9.2* 8.3* 8.0*  HCT 30.0* 26.9* 26.9*  PLT 242 235 243  LABPROT 46.2* 53.1* 36.3*  INR 5.29* 6.00* 3.69  CREATININE 2.04* 2.41* 3.26*    Estimated Creatinine Clearance: 11.8 mL/min (A) (by C-G formula based on SCr of 3.26 mg/dL (H)).   Assessment: 79 YOF on warfarin PTA for AFib- Home dose (as per Coumadin Clinic note on 10/12) was 4mg  daily except 6mg  on Mondays- dose was reduced to that regimen during that appointment d/t INR of 3.6 in the clinic. INR down to 3.69 today. H/H trending down. Plt stable   Goal of Therapy:  INR 2-3 Monitor platelets by anticoagulation protocol: Yes   Plan:  Hold warfarin tonight Daily INR and CBC Follow s/s bleeding  Vinnie Level, PharmD., BCPS Clinical Pharmacist Pager [phone]

## 2017-03-23 NOTE — Progress Notes (Signed)
Progress Note  Patient Name: Alexis [name] Date of Encounter: 03/23/2017  Primary Cardiologist: Hilty  Subjective   No chest pain and no SOB  Resting comfortable in chair  Inpatient Medications    Scheduled Meds: . atorvastatin  20 mg Oral Daily  . cephALEXin  500 mg Oral Q12H  . cholecalciferol  1,000 Units Oral q morning - 10a  . escitalopram  10 mg Oral Daily  . feeding supplement (ENSURE ENLIVE)  237 mL Oral BID BM  . fenofibrate  160 mg Oral Daily  . gabapentin  100 mg Oral QHS  . guaiFENesin  600 mg Oral BID  . hydrALAZINE  75 mg Oral BID  . insulin aspart  0-15 Units Subcutaneous TID WC  . insulin aspart  0-5 Units Subcutaneous QHS  . insulin aspart  5 Units Subcutaneous TID WC  . insulin detemir  10 Units Subcutaneous Daily  . iron polysaccharides  150 mg Oral Daily  . isosorbide mononitrate  30 mg Oral Daily  . methimazole  5 mg Oral Daily  . metoprolol tartrate  25 mg Oral BID  . multivitamin with minerals  1 tablet Oral Daily  . ondansetron  4 mg Oral Q6H  . polyethylene glycol  17 g Oral Daily  . potassium chloride SA  20 mEq Oral Daily  . sodium chloride flush  3 mL Intravenous Q12H  . Warfarin - Pharmacist Dosing Inpatient   Does not apply q1800   Continuous Infusions: . sodium chloride     PRN Meds: sodium chloride, acetaminophen **OR** acetaminophen, albuterol, ondansetron **OR** ondansetron (ZOFRAN) IV, sodium chloride flush, traZODone   Vital Signs    Vitals:   03/22/17 1136 03/22/17 1952 03/22/17 2117 03/23/17 0347  BP: (!) 123/45 (!) 96/57 (!) 113/34 (!) 145/43  Pulse: (!) 53 68 63 68  Resp: 16 16  18   Temp: 97.6 F (36.4 C) 98.2 F (36.8 C)  98 F (36.7 C)  TempSrc: Oral Oral  Oral  SpO2: 96% 94%  96%  Weight:    149 lb 9.6 oz (67.9 kg)  Height:        Intake/Output Summary (Last 24 hours) at 03/23/17 1036 Last data filed at 03/23/17 0933  Gross per 24 hour  Intake              720 ml  Output              600 ml  Net               120 ml   Filed Weights   03/21/17 0438 03/22/17 0637 03/23/17 0347  Weight: 144 lb 12.8 oz (65.7 kg) 147 lb 4.8 oz (66.8 kg) 149 lb 9.6 oz (67.9 kg)    Telemetry    Sr with SB to 39 for brief episode  - Personally Reviewed  ECG    No new - Personally Reviewed  Physical Exam   GEN: No acute distress.   Neck: No JVD sitting up right in chair Cardiac: RRR, 2/6 systolic murmur,  No rubs, or gallops.  Respiratory: bilateral breath sounds to auscultation bilaterally few rales in bases. GI: Soft, nontender, non-distended  MS: No edema; No deformity. Neuro:  Nonfocal  Psych: Normal affect   Labs    Chemistry Recent Labs Lab 03/21/17 0506 03/22/17 0518 03/23/17 0404  NA 138 139 138  K 4.4 4.8 5.2*  CL 100* 99* 98*  CO2 27 30 31   GLUCOSE 377* 89 236*  BUN  40* 56* 72*  CREATININE 2.04* 2.41* 3.26*  CALCIUM 8.9 8.7* 8.4*  GFRNONAA 21* 17* 12*  GFRAA 25* 20* 14*  ANIONGAP 11 10 9      Hematology Recent Labs Lab 03/21/17 0506 03/22/17 0518 03/23/17 0404  WBC 11.1* 12.9* 8.7  RBC 3.45* 3.08* 3.03*  HGB 9.2* 8.3* 8.0*  HCT 30.0* 26.9* 26.9*  MCV 87.0 87.3 88.8  MCH 26.7 26.9 26.4  MCHC 30.7 30.9 29.7*  RDW 15.6* 15.4 15.4  PLT 242 235 243    Cardiac Enzymes Recent Labs Lab 03/20/17 1130  TROPONINI <0.03    Recent Labs Lab 03/20/17 1143  TROPIPOC 0.00     BNP Recent Labs Lab 03/20/17 1130  BNP 710.5*     DDimer No results for input(s): DDIMER in the last 168 hours.   Radiology    No results found.  Cardiac Studies   Echocardiogram 03/21/17: - Left ventricle: The cavity size was normal. Wall thickness was normal. Systolic function was vigorous. The estimated ejection fraction was in the range of 65% to 70%. Wall motion was normal; there were no regional wall motion abnormalities. Features are consistent with a pseudonormal left ventricular filling pattern, with concomitant abnormal relaxation and increased  filling pressure (grade 2 diastolic dysfunction). - Aortic valve: Trileaflet; moderately thickened, moderately calcified leaflets. There was mild stenosis. Peak velocity (S): 276 cm/s. Mean gradient (S): 15 mm Hg. Valve area (VTI): 1.69 cm^2. Valve area (Vmax): 1.76 cm^2. Valve area (Vmean): 1.64 cm^2. - Mitral valve: Moderately calcified annulus. Mildly thickened leaflets . There was moderate regurgitation. - Left atrium: The atrium was moderately to severely dilated. Volume/bsa, S: 49.5 ml/m^2. - Right atrium: The atrium was moderately dilated. - Tricuspid valve: There was moderate regurgitation. - Pulmonary arteries: Systolic pressure was severely increased. PA peak pressure: 70 mm Hg (S).  Impressions:  - Compared to the prior study, there has been no significant interval change.  Patient Profile     83 y.o. female admitted with acute on chronic diastolic heart failure, increased shortness of breath, fatigue  Assessment & Plan    Acute on chronic diastolic heart failure - Net fluid out approximately 1.6 L, creatinine increased from 1.93-2.41 and today 3.26 BUN increased from 38-56 and today 72.  Original BNP 710, troponin normal. -With rising creatinine, I would hold off further IV diuresis. - Prior nuclear stress test in 2015 showed no ischemia.   COPD exacerbation -Solu-Medrol bronchodilators were administered.  This is likely what is driving her increasing white count. - No longer on intravenous steroids.  She has chronic home oxygen use. No DOB currently  Paroxysmal atrial fibrillation -CHADSVASc score 6.  Has had cardioversion in the past. -Chronic anticoagulation with Coumadin, INR is rising.  pk of 6 yesterday today 3.69 Currently on hold.  No bleeding.  Note she does have a history of GI bleeding.  Mild aortic stenosis -Should be of no clinical consequence at this point.  Sinus bradycardia to 39 brief episode on BB at 25 BID.     Hyperkalemia - stopped Kdur - Audianna Landgren resume if K+ decreases.   Anemia with hgb at 8.0 down from 9.4 on admit   For questions or updates, please contact [facility] Please consult [email or web address] for contact info under Cardiology/STEMI.      Signed, Nada Boozer, NP  03/23/2017, 10:36 AM    I have seen and examined this patient with Nada Boozer.  Agree with above, note added to reflect my findings.  On exam,  RRR, no murmurs, lungs clear.  She admitted initially to the hospital with shortness of breath.  Likely due to the combination of COPD exacerbation and diastolic heart failure.  Had been diuresed up to 1.6 L, but her creatinine started to rise.  It is now in acute renal failure on chronic renal failure.  It is possible that she was diuresed too much.  Agree with holding back on further diuresis.  She may benefit from 500 cc of normal saline.  We Lanya Bucks try this today.  It does appear that her primary because of shortness of breath could have been due to COPD exacerbation.  Wiatt Mahabir M. Nathaly Dawkins MD 03/23/2017 11:48 AM

## 2017-03-23 NOTE — Plan of Care (Signed)
Problem: Pain Managment: Goal: General experience of comfort will improve Outcome: Progressing Patient reports no pain during the night.

## 2017-03-23 NOTE — Progress Notes (Signed)
Patient slept through the night, complained of headache was given tylenol. Vitals are stable, will continue to monitor.   Elsie Lincoln, RN

## 2017-03-23 NOTE — Plan of Care (Signed)
Problem: Education: Goal: Ability to demonstrate managment of disease process will improve Outcome: Not Progressing Watching kidney function

## 2017-03-24 LAB — CBC WITH DIFFERENTIAL/PLATELET
BASOS ABS: 0 10*3/uL (ref 0.0–0.1)
BASOS PCT: 0 %
EOS PCT: 8 %
Eosinophils Absolute: 0.7 10*3/uL (ref 0.0–0.7)
HCT: 26.2 % — ABNORMAL LOW (ref 36.0–46.0)
Hemoglobin: 7.9 g/dL — ABNORMAL LOW (ref 12.0–15.0)
LYMPHS ABS: 0.6 10*3/uL — AB (ref 0.7–4.0)
LYMPHS PCT: 7 %
MCH: 27.1 pg (ref 26.0–34.0)
MCHC: 30.2 g/dL (ref 30.0–36.0)
MCV: 89.7 fL (ref 78.0–100.0)
MONOS PCT: 10 %
Monocytes Absolute: 0.8 10*3/uL (ref 0.1–1.0)
Neutro Abs: 6.5 10*3/uL (ref 1.7–7.7)
Neutrophils Relative %: 75 %
PLATELETS: 236 10*3/uL (ref 150–400)
RBC: 2.92 MIL/uL — AB (ref 3.87–5.11)
RDW: 15.8 % — ABNORMAL HIGH (ref 11.5–15.5)
WBC: 8.7 10*3/uL (ref 4.0–10.5)

## 2017-03-24 LAB — MAGNESIUM: Magnesium: 2.4 mg/dL (ref 1.7–2.4)

## 2017-03-24 LAB — GLUCOSE, CAPILLARY
GLUCOSE-CAPILLARY: 181 mg/dL — AB (ref 65–99)
GLUCOSE-CAPILLARY: 152 mg/dL — AB (ref 65–99)
GLUCOSE-CAPILLARY: 128 mg/dL — AB (ref 65–99)
Glucose-Capillary: 109 mg/dL — ABNORMAL HIGH (ref 65–99)

## 2017-03-24 LAB — BASIC METABOLIC PANEL
Anion gap: 7 (ref 5–15)
BUN: 66 mg/dL — AB (ref 6–20)
CHLORIDE: 102 mmol/L (ref 101–111)
CO2: 30 mmol/L (ref 22–32)
CREATININE: 2.96 mg/dL — AB (ref 0.44–1.00)
Calcium: 8.2 mg/dL — ABNORMAL LOW (ref 8.9–10.3)
GFR calc non Af Amer: 14 mL/min — ABNORMAL LOW (ref >60)
GFR, EST AFRICAN AMERICAN: 16 mL/min — AB (ref >60)
GLUCOSE: 122 mg/dL — AB (ref 65–99)
Potassium: 5.3 mmol/L — ABNORMAL HIGH (ref 3.5–5.1)
Sodium: 139 mmol/L (ref 135–145)

## 2017-03-24 LAB — PROTIME-INR
INR: 2.31
Prothrombin Time: 25.2 seconds — ABNORMAL HIGH (ref 11.4–15.2)

## 2017-03-24 MED ORDER — WARFARIN SODIUM 2 MG PO TABS
2.0000 mg | ORAL_TABLET | Freq: Once | ORAL | Status: AC
  Administered 2017-03-24: 2 mg via ORAL
  Filled 2017-03-24: qty 1

## 2017-03-24 NOTE — Progress Notes (Signed)
ANTICOAGULATION CONSULT NOTE   Pharmacy Consult for Warfarin Indication: atrial fibrillation  Allergies  Allergen Reactions  . Yellow Dyes (Non-Tartrazine) Nausea And Vomiting    "deathly allergic" No other information given to explain reaction. Per Daughter- this was an IV dye. Ok with yellow coloring on Coumadin tablets.   . Codeine Hives  . Other Other (See Comments)    novocaine broke mouth out  . Penicillins Hives  . Procaine Hives    Labs:  Recent Labs  03/22/17 0518 03/23/17 0404 03/24/17 0508  HGB 8.3* 8.0* 7.9*  HCT 26.9* 26.9* 26.2*  PLT 235 243 236  LABPROT 53.1* 36.3* 25.2*  INR 6.00* 3.69 2.31  CREATININE 2.41* 3.26* 2.96*    Estimated Creatinine Clearance: 13 mL/min (A) (by C-G formula based on SCr of 2.96 mg/dL (H)).   Assessment: 89 YOF on warfarin PTA for AFib- Home dose (as per Coumadin Clinic note on 10/12) was 4mg  daily except 6mg  on Mondays- dose was reduced to that regimen during that appointment d/t INR of 3.6 in the clinic.  Coumadin last taken on 10/24 PTA,  Held dose since admission on 03/20/17.  INR has improved to 2.31 today, therapeutic range. H/H trending down with Hg is 7.9 today. Chronic anemia MD notes probably 2/2 anemia of chronic disease from CKD, stable and is monitoring. No signs of bleeding. Pltc stable wnl.   Goal of Therapy:  INR 2-3 Monitor platelets by anticoagulation protocol: Yes   Plan:  Give warfarin  2mg  x1 today. Daily INR and CBC Follow s/s bleeding  Alexis [name], RPh Clinical Pharmacist Pager: [phone] 8a-330p [phone] 330p-1030p phone [phone] or [phone] Main pharmacy [phone] 03/24/2017 11:58 AM

## 2017-03-24 NOTE — Care Management Note (Signed)
Case Management Note  Patient Details  Name: CARLEY ABELE MRN: [id number] Date of Birth: 09/29/1931  Subjective/Objective:     CHF              Action/Plan: Patient lives at home with her son Onalee Hua; PCP [name], PA-C; has private insurance with [facility] with prescription drug coverage; pharmacy of choice is [facility]; DME at home - walker, 3:1; patient is agreeable to [facility] services and requested [facility]; Lupita Leash with [facility] called for arrangements.  Expected Discharge Date:  Possibly 03/25/2017            Expected Discharge Plan:  Home w Home Health Services  Discharge planning Services  CM Consult   Choice offered to:  Patient, Adult Children  HH Arranged:  RN, Disease Management, PT, OT, Nurse's Aide HH Agency:  [facility]  Status of Service:  In process, will continue to follow  Reola Mosher [phone] 03/24/2017, 11:03 AM

## 2017-03-24 NOTE — Progress Notes (Signed)
Progress Note  Patient Name: Alexis [name] Date of Encounter: 03/24/2017  Primary Cardiologist: Hilty   Subjective   83 year old female with a history of chronic diastolic congestive heart failure, paroxysmal atrial fibrillation, hypertension who was admitted with  increasing shortness of breath.  Echocardiogram October 26 reveals normal left ventricular systolic function with EF 65-70%.  She has grade 2 diastolic dysfunction.  She has mild aortic stenosis with a mean aortic valve gradient of 15 mmHg.  She has severe pulmonary artery hypertension with estimated PA pressure of 70.  Inpatient Medications    Scheduled Meds: . atorvastatin  20 mg Oral Daily  . cholecalciferol  1,000 Units Oral q morning - 10a  . escitalopram  10 mg Oral Daily  . feeding supplement (ENSURE ENLIVE)  237 mL Oral BID BM  . fenofibrate  160 mg Oral Daily  . gabapentin  100 mg Oral QHS  . guaiFENesin  600 mg Oral BID  . hydrALAZINE  75 mg Oral BID  . insulin aspart  0-15 Units Subcutaneous TID WC  . insulin aspart  0-5 Units Subcutaneous QHS  . insulin aspart  5 Units Subcutaneous TID WC  . insulin detemir  10 Units Subcutaneous Daily  . iron polysaccharides  150 mg Oral Daily  . isosorbide mononitrate  30 mg Oral Daily  . methimazole  5 mg Oral Daily  . metoprolol tartrate  25 mg Oral BID  . multivitamin with minerals  1 tablet Oral Daily  . ondansetron  4 mg Oral Q6H  . polyethylene glycol  17 g Oral Daily  . sodium chloride flush  3 mL Intravenous Q12H  . Warfarin - Pharmacist Dosing Inpatient   Does not apply q1800   Continuous Infusions: . sodium chloride    . sodium chloride 50 mL/hr at 03/23/17 1800   PRN Meds: sodium chloride, acetaminophen **OR** acetaminophen, albuterol, ondansetron **OR** ondansetron (ZOFRAN) IV, sodium chloride flush, traZODone   Vital Signs    Vitals:   03/23/17 0347 03/23/17 1208 03/23/17 1959 03/24/17 0529  BP: (!) 145/43 (!) 126/44 (!) 137/44 (!)  143/42  Pulse: 68 67 (!) 51 (!) 58  Resp: 18 18 18 18   Temp: 98 F (36.7 C) 98.2 F (36.8 C) 97.7 F (36.5 C) 98.4 F (36.9 C)  TempSrc: Oral Oral Oral Oral  SpO2: 96% 97% 98% 99%  Weight: 149 lb 9.6 oz (67.9 kg)   150 lb 9.6 oz (68.3 kg)  Height:        Intake/Output Summary (Last 24 hours) at 03/24/17 0924 Last data filed at 03/24/17 0849  Gross per 24 hour  Intake             1400 ml  Output              501 ml  Net              899 ml   Filed Weights   03/22/17 0637 03/23/17 0347 03/24/17 0529  Weight: 147 lb 4.8 oz (66.8 kg) 149 lb 9.6 oz (67.9 kg) 150 lb 9.6 oz (68.3 kg)    Telemetry    NSR  - Personally Reviewed  ECG     NSR  - Personally Reviewed  Physical Exam   GEN: No acute distress.   Chronically ill-appearing elderly female Neck: No JVD Cardiac: RRR, 2/6 systolic murmur  Respiratory: bilateral rales.   GI: Soft, nontender, non-distended  MS: No edema; No deformity. Neuro:  Nonfocal , she appears to  be generally weak. Psych: Normal affect   Labs    Chemistry Recent Labs Lab 03/22/17 0518 03/23/17 0404 03/24/17 0508  NA 139 138 139  K 4.8 5.2* 5.3*  CL 99* 98* 102  CO2 30 31 30   GLUCOSE 89 236* 122*  BUN 56* 72* 66*  CREATININE 2.41* 3.26* 2.96*  CALCIUM 8.7* 8.4* 8.2*  GFRNONAA 17* 12* 14*  GFRAA 20* 14* 16*  ANIONGAP 10 9 7      Hematology Recent Labs Lab 03/22/17 0518 03/23/17 0404 03/24/17 0508  WBC 12.9* 8.7 8.7  RBC 3.08* 3.03* 2.92*  HGB 8.3* 8.0* 7.9*  HCT 26.9* 26.9* 26.2*  MCV 87.3 88.8 89.7  MCH 26.9 26.4 27.1  MCHC 30.9 29.7* 30.2  RDW 15.4 15.4 15.8*  PLT 235 243 236    Cardiac Enzymes Recent Labs Lab 03/20/17 1130  TROPONINI <0.03    Recent Labs Lab 03/20/17 1143  TROPIPOC 0.00     BNP Recent Labs Lab 03/20/17 1130  BNP 710.5*     DDimer No results for input(s): DDIMER in the last 168 hours.   Radiology    No results found.  Cardiac Studies      Patient Profile     83 y.o. female  with chronic diastolic congestive heart failure, pulmonary hypertension, chronic kidney disease admitted with increasing shortness of breath  Assessment & Plan    1.  Acute on chronic diastolic congestive heart failure.  She was diuresed and is feeling better.  Unfortunately, her creatinine increased is now 2.96. At home she was on torsemide 40 mg a day. Would anticipate restarting   p.o. Torsemide today or  tomorrow  2.  Paroxysmal atrial fibrillation: She is currently in sinus rhythm. INR is 2.31.  Heart rate is slow but is otherwise  well controlled.  3.  Severe pulmonary hypertension: Likely secondary to her COPD.  For questions or updates, please contact [facility] Please consult [email or web address] for contact info under Cardiology/STEMI.      Signed, Kristeen MissPhilip Cleophus Mendonsa, MD  03/24/2017, 9:24 AM

## 2017-03-24 NOTE — Progress Notes (Signed)
Physical Therapy Treatment Patient Details Name: Alexis [name] MRN: [id number] DOB: 02/02/1932 Today's Date: 03/24/2017    History of Present Illness Pt is an 83 y.o. female admitted 03/20/17 with SOB, orthopnea, weight gain, increased O2 requirement.  Patient with CHF exacerbation and UTI.   PMH:  CHF, PAF, DM, neuropathy, COPD on home O2 at 2L, HTN, CKD, anemia, macular degeneration, confusion.     PT Comments    Pt progressing well with mobility. She ambulated 180 feet on 3 L O2 with RW and min guard assist. Discharge recommendations remain appropriate.  Follow Up Recommendations  Home health PT;Supervision for mobility/OOB     Equipment Recommendations  Other (comment) (light-weight O2 for home)    Recommendations for Other Services       Precautions / Restrictions Precautions Precautions: None Restrictions Weight Bearing Restrictions: No    Mobility  Bed Mobility         Supine to sit: Min assist;HOB elevated     General bed mobility comments: +rail, assist to elevate trunk, verbal cues for sequencing  Transfers   Equipment used: Rolling walker (2 wheeled)   Sit to Stand: Min guard         General transfer comment: verbal cues for hand placement and sequencing  Ambulation/Gait Ambulation/Gait assistance: Min guard Ambulation Distance (Feet): 180 Feet Assistive device: Rolling walker (2 wheeled) Gait Pattern/deviations: Step-through pattern;Decreased stride length Gait velocity: decreased Gait velocity interpretation: Below normal speed for age/gender General Gait Details: Pt ambulated on 3 L O2 with SpO2 94%.   Stairs            Wheelchair Mobility    Modified Rankin (Stroke Patients Only)       Balance   Sitting-balance support: No upper extremity supported;Feet supported Sitting balance-Leahy Scale: Good     Standing balance support: Bilateral upper extremity supported Standing balance-Leahy Scale: Poor                               Cognition Arousal/Alertness: Awake/alert Behavior During Therapy: WFL for tasks assessed/performed Overall Cognitive Status: Within Functional Limits for tasks assessed                                        Exercises      General Comments        Pertinent Vitals/Pain Pain Assessment: No/denies pain    Home Living                      Prior Function            PT Goals (current goals can now be found in the care plan section) Acute Rehab PT Goals Patient Stated Goal: To return home PT Goal Formulation: With patient/family Time For Goal Achievement: 03/28/17 Potential to Achieve Goals: Good Progress towards PT goals: Progressing toward goals    Frequency    Min 3X/week      PT Plan Current plan remains appropriate    Co-evaluation              AM-PAC PT "6 Clicks" Daily Activity  Outcome Measure  Difficulty turning over in bed (including adjusting bedclothes, sheets and blankets)?: None Difficulty moving from lying on back to sitting on the side of the bed? : A Little Difficulty sitting down on and standing up from  a chair with arms (e.g., wheelchair, bedside commode, etc,.)?: A Little Help needed moving to and from a bed to chair (including a wheelchair)?: A Little Help needed walking in hospital room?: A Little Help needed climbing 3-5 steps with a railing? : A Lot 6 Click Score: 18    End of Session Equipment Utilized During Treatment: Gait belt;Oxygen Activity Tolerance: Patient tolerated treatment well Patient left: in chair;with family/visitor present;with call bell/phone within reach;with chair alarm set Nurse Communication: Mobility status PT Visit Diagnosis: Unsteadiness on feet (R26.81);Muscle weakness (generalized) (M62.81)     Time: 7001-7494 PT Time Calculation (min) (ACUTE ONLY): 25 min  Charges:  $Gait Training: 23-37 mins                    G Codes:       Aida Raider, PT   Office # [phone] Pager [phone]    Ilda Foil 03/24/2017, 10:24 AM

## 2017-03-24 NOTE — Discharge Instructions (Signed)

## 2017-03-24 NOTE — Progress Notes (Signed)
Patient ID: Alexis [name], female   DOB: 04-07-1932, 83 y.o.   MRN: [id number]  PROGRESS NOTE    GERE WINWARD  OMB:[id number] DOB: 1932-02-15 DOA: 03/20/2017 PCP: [name], PA-C   Brief Narrative:  83 year old female with history of chronic diastolic CHF, diabetes mellitus type 2, COPD, chronic hypoxic respiratory failure on 2-3 L oxygen at home, paroxysmal atrial fibrillation on anticoagulation, hypertension, chronic kidney disease stage IV presented with worsening shortness of breath and was admitted with intravenous Lasix for CHF exacerbation.   Assessment & Plan:   Principal Problem:   CHF (congestive heart failure), NYHA class I, unspecified failure chronicity, combined (HCC) Active Problems:   (HFpEF) heart failure with preserved ejection fraction (HCC)   Paroxysmal atrial fibrillation (HCC)   Chronic respiratory failure with hypoxia (HCC)   UTI (urinary tract infection)   Type II diabetes mellitus (HCC)   Chronic renal insufficiency, stage 3 (moderate) (HCC)  Acute on chronic diastolic heart failure - Lasix discontinued because of bump in creatinine. Lasix on hold for now. Repeat creatinine for tomorrow. Strict input and output. Negative fluid balance of 1083 mL since admission. Daily weights. Echo showed ejection fraction of 65-70% with grade 2 diastolic dysfunction. Cardiology following. Continue hydralazine, metoprolol and isosorbide mononitrate.  Paroxysmal atrial fibrillation -Currently rate controlled with mild bradycardia. Continue metoprolol. Coumadin is being dosed per pharmacy.  Supratherapeutic INR - Improved. No signs of bleeding. Resume Coumadin; dose as per pharmacy. Monitor  Probable UTI/probable cystitis - Escherichia coli growing in urine cultures. Treated with antibiotics  Chronic hypoxic respiratory failure - Continue oxygen supplementation. Not wheezing currently so no need for intravenous steroids.  Hypertension - Monitor blood  pressure. Continue metoprolol, hydralazine and isosorbide mononitrate\  Acute kidney injury on Chronic kidney disease stage IV - Creatinine is 2.96 today. Repeat a.m. creatinine. Hold Lasix. Continue iv fluids  Diabetes mellitus type 2 uncontrolled with hyperglycemia -  Hemoglobin A1c 8.8. Patient is not on any oral medications at home for some reason.  - Continue Levemir 5 units daily and NovoLog along with sliding scale insulin  Chronic anemia probably secondary anemia of chronic disease from chronic kidney disease - Stable. Monitor. No signs of bleeding  DVT prophylaxis: Coumadin held because of supratherapeutic INR Code Status:  Full Family Communication: None at bedside Disposition Plan: Home in 1-3 days  Consultants: Cardiology  Procedures:  Study Conclusions  - Left ventricle: The cavity size was normal. Wall thickness was   normal. Systolic function was vigorous. The estimated ejection   fraction was in the range of 65% to 70%. Wall motion was normal;   there were no regional wall motion abnormalities. Features are   consistent with a pseudonormal left ventricular filling pattern,   with concomitant abnormal relaxation and increased filling   pressure (grade 2 diastolic dysfunction). - Aortic valve: Trileaflet; moderately thickened, moderately   calcified leaflets. There was mild stenosis. Peak velocity (S):   276 cm/s. Mean gradient (S): 15 mm Hg. Valve area (VTI): 1.69   cm^2. Valve area (Vmax): 1.76 cm^2. Valve area (Vmean): 1.64   cm^2. - Mitral valve: Moderately calcified annulus. Mildly thickened   leaflets . There was moderate regurgitation. - Left atrium: The atrium was moderately to severely dilated.   Volume/bsa, S: 49.5 ml/m^2. - Right atrium: The atrium was moderately dilated. - Tricuspid valve: There was moderate regurgitation. - Pulmonary arteries: Systolic pressure was severely increased. PA   peak pressure: 70 mm Hg (S).  Impressions:  -  Compared to the prior study, there has been no significant   interval change.  Antimicrobials: Rocephin from 03/20/2017-03/22/2017 Keflex from 03/22/2017-03/23/17  Subjective: Patient seen and examined at bedside. She denies overnight fever, nausea or vomiting. She is a poor historian. No current chest pain or shortness of breath.  Objective: Vitals:   03/23/17 0347 03/23/17 1208 03/23/17 1959 03/24/17 0529  BP: (!) 145/43 (!) 126/44 (!) 137/44 (!) 143/42  Pulse: 68 67 (!) 51 (!) 58  Resp: 18 18 18 18   Temp: 98 F (36.7 C) 98.2 F (36.8 C) 97.7 F (36.5 C) 98.4 F (36.9 C)  TempSrc: Oral Oral Oral Oral  SpO2: 96% 97% 98% 99%  Weight: 67.9 kg (149 lb 9.6 oz)   68.3 kg (150 lb 9.6 oz)  Height:        Intake/Output Summary (Last 24 hours) at 03/24/17 1102 Last data filed at 03/24/17 1016  Gross per 24 hour  Intake             1640 ml  Output              701 ml  Net              939 ml   Filed Weights   03/22/17 0637 03/23/17 0347 03/24/17 0529  Weight: 66.8 kg (147 lb 4.8 oz) 67.9 kg (149 lb 9.6 oz) 68.3 kg (150 lb 9.6 oz)    Examination:  General exam: Appears calm and comfortable; elderly female Respiratory system: Bilateral decreased breath sound at bases with basilar crackles Cardiovascular system: S1 & S2 heard, rate controlled  Gastrointestinal system: Abdomen is nondistended, soft and nontender. Normal bowel sounds heard. Extremities: No cyanosis, clubbing; trace edema   Data Reviewed: I have personally reviewed following labs and imaging studies  CBC:  Recent Labs Lab 03/20/17 1130 03/21/17 0506 03/22/17 0518 03/23/17 0404 03/24/17 0508  WBC 8.8 11.1* 12.9* 8.7 8.7  NEUTROABS  --   --  10.9* 6.4 6.5  HGB 9.4* 9.2* 8.3* 8.0* 7.9*  HCT 31.4* 30.0* 26.9* 26.9* 26.2*  MCV 87.7 87.0 87.3 88.8 89.7  PLT 236 242 235 243 236   Basic Metabolic Panel:  Recent Labs Lab 03/20/17 1130 03/21/17 0506 03/22/17 0518 03/23/17 0404 03/24/17 0508  NA 141  138 139 138 139  K 4.2 4.4 4.8 5.2* 5.3*  CL 104 100* 99* 98* 102  CO2 27 27 30 31 30   GLUCOSE 205* 377* 89 236* 122*  BUN 38* 40* 56* 72* 66*  CREATININE 1.93* 2.04* 2.41* 3.26* 2.96*  CALCIUM 8.8* 8.9 8.7* 8.4* 8.2*  MG  --   --  2.2 2.4 2.4   GFR: Estimated Creatinine Clearance: 13 mL/min (A) (by C-G formula based on SCr of 2.96 mg/dL (H)). Liver Function Tests: No results for input(s): AST, ALT, ALKPHOS, BILITOT, PROT, ALBUMIN in the last 168 hours. No results for input(s): LIPASE, AMYLASE in the last 168 hours. No results for input(s): AMMONIA in the last 168 hours. Coagulation Profile:  Recent Labs Lab 03/20/17 1130 03/21/17 0506 03/22/17 0518 03/23/17 0404 03/24/17 0508  INR 4.04* 5.29* 6.00* 3.69 2.31   Cardiac Enzymes:  Recent Labs Lab 03/20/17 1130  TROPONINI <0.03   BNP (last 3 results) No results for input(s): PROBNP in the last 8760 hours. HbA1C:  Recent Labs  03/22/17 0518  HGBA1C 8.8*   CBG:  Recent Labs Lab 03/23/17 0742 03/23/17 1111 03/23/17 1627 03/23/17 2117 03/24/17 0755  GLUCAP 155* 171* 73 185* 109*  Lipid Profile: No results for input(s): CHOL, HDL, LDLCALC, TRIG, CHOLHDL, LDLDIRECT in the last 72 hours. Thyroid Function Tests: No results for input(s): TSH, T4TOTAL, FREET4, T3FREE, THYROIDAB in the last 72 hours. Anemia Panel: No results for input(s): VITAMINB12, FOLATE, FERRITIN, TIBC, IRON, RETICCTPCT in the last 72 hours. Sepsis Labs: No results for input(s): PROCALCITON, LATICACIDVEN in the last 168 hours.  Recent Results (from the past 240 hour(s))  Urine Culture     Status: Abnormal   Collection Time: 03/20/17 12:40 PM  Result Value Ref Range Status   Specimen Description URINE, RANDOM  Final   Special Requests NONE  Final   Culture >=100,000 COLONIES/mL ESCHERICHIA COLI (A)  Final   Report Status 03/22/2017 FINAL  Final   Organism ID, Bacteria ESCHERICHIA COLI (A)  Final      Susceptibility   Escherichia coli -  MIC*    AMPICILLIN <=2 SENSITIVE Sensitive     CEFAZOLIN <=4 SENSITIVE Sensitive     CEFTRIAXONE <=1 SENSITIVE Sensitive     CIPROFLOXACIN <=0.25 SENSITIVE Sensitive     GENTAMICIN <=1 SENSITIVE Sensitive     IMIPENEM <=0.25 SENSITIVE Sensitive     NITROFURANTOIN 256 RESISTANT Resistant     TRIMETH/SULFA <=20 SENSITIVE Sensitive     AMPICILLIN/SULBACTAM <=2 SENSITIVE Sensitive     PIP/TAZO <=4 SENSITIVE Sensitive     Extended ESBL NEGATIVE Sensitive     * >=100,000 COLONIES/mL ESCHERICHIA COLI         Radiology Studies: No results found.      Scheduled Meds: . atorvastatin  20 mg Oral Daily  . cholecalciferol  1,000 Units Oral q morning - 10a  . escitalopram  10 mg Oral Daily  . feeding supplement (ENSURE ENLIVE)  237 mL Oral BID BM  . fenofibrate  160 mg Oral Daily  . gabapentin  100 mg Oral QHS  . guaiFENesin  600 mg Oral BID  . hydrALAZINE  75 mg Oral BID  . insulin aspart  0-15 Units Subcutaneous TID WC  . insulin aspart  0-5 Units Subcutaneous QHS  . insulin aspart  5 Units Subcutaneous TID WC  . insulin detemir  10 Units Subcutaneous Daily  . iron polysaccharides  150 mg Oral Daily  . isosorbide mononitrate  30 mg Oral Daily  . methimazole  5 mg Oral Daily  . metoprolol tartrate  25 mg Oral BID  . multivitamin with minerals  1 tablet Oral Daily  . ondansetron  4 mg Oral Q6H  . polyethylene glycol  17 g Oral Daily  . sodium chloride flush  3 mL Intravenous Q12H  . Warfarin - Pharmacist Dosing Inpatient   Does not apply q1800   Continuous Infusions: . sodium chloride    . sodium chloride 50 mL/hr at 03/23/17 1800     LOS: 4 days        Glade Lloyd, MD [facility] Pager [phone]  If 7PM-7AM, please contact night-coverage [email or web address] Password [email or web address] 03/24/2017, 11:02 AM

## 2017-03-25 LAB — BASIC METABOLIC PANEL
Anion gap: 7 (ref 5–15)
BUN: 62 mg/dL — AB (ref 6–20)
CALCIUM: 7.9 mg/dL — AB (ref 8.9–10.3)
CHLORIDE: 106 mmol/L (ref 101–111)
CO2: 27 mmol/L (ref 22–32)
CREATININE: 2.75 mg/dL — AB (ref 0.44–1.00)
GFR calc non Af Amer: 15 mL/min — ABNORMAL LOW (ref >60)
GFR, EST AFRICAN AMERICAN: 17 mL/min — AB (ref >60)
Glucose, Bld: 105 mg/dL — ABNORMAL HIGH (ref 65–99)
Potassium: 5.4 mmol/L — ABNORMAL HIGH (ref 3.5–5.1)
Sodium: 140 mmol/L (ref 135–145)

## 2017-03-25 LAB — CBC WITH DIFFERENTIAL/PLATELET
BASOS PCT: 0 %
Basophils Absolute: 0 10*3/uL (ref 0.0–0.1)
EOS ABS: 0.7 10*3/uL (ref 0.0–0.7)
Eosinophils Relative: 10 %
HEMATOCRIT: 25.7 % — AB (ref 36.0–46.0)
HEMOGLOBIN: 7.8 g/dL — AB (ref 12.0–15.0)
LYMPHS ABS: 0.7 10*3/uL (ref 0.7–4.0)
Lymphocytes Relative: 11 %
MCH: 27.2 pg (ref 26.0–34.0)
MCHC: 30.4 g/dL (ref 30.0–36.0)
MCV: 89.5 fL (ref 78.0–100.0)
MONO ABS: 0.6 10*3/uL (ref 0.1–1.0)
MONOS PCT: 9 %
NEUTROS ABS: 4.4 10*3/uL (ref 1.7–7.7)
Neutrophils Relative %: 70 %
Platelets: 231 10*3/uL (ref 150–400)
RBC: 2.87 MIL/uL — ABNORMAL LOW (ref 3.87–5.11)
RDW: 15.6 % — AB (ref 11.5–15.5)
WBC: 6.4 10*3/uL (ref 4.0–10.5)

## 2017-03-25 LAB — PROTIME-INR
INR: 1.87
PROTHROMBIN TIME: 21.4 s — AB (ref 11.4–15.2)

## 2017-03-25 LAB — GLUCOSE, CAPILLARY
GLUCOSE-CAPILLARY: 110 mg/dL — AB (ref 65–99)
GLUCOSE-CAPILLARY: 198 mg/dL — AB (ref 65–99)
Glucose-Capillary: 220 mg/dL — ABNORMAL HIGH (ref 65–99)
Glucose-Capillary: 219 mg/dL — ABNORMAL HIGH (ref 65–99)

## 2017-03-25 LAB — MAGNESIUM: Magnesium: 2.5 mg/dL — ABNORMAL HIGH (ref 1.7–2.4)

## 2017-03-25 MED ORDER — WARFARIN SODIUM 2 MG PO TABS
4.0000 mg | ORAL_TABLET | Freq: Once | ORAL | Status: AC
  Administered 2017-03-25: 4 mg via ORAL
  Filled 2017-03-25: qty 2

## 2017-03-25 MED ORDER — TORSEMIDE 20 MG PO TABS
40.0000 mg | ORAL_TABLET | Freq: Every day | ORAL | Status: DC
  Administered 2017-03-25 – 2017-03-27 (×3): 40 mg via ORAL
  Filled 2017-03-25 (×3): qty 2

## 2017-03-25 NOTE — Progress Notes (Signed)
Progress Note  Patient Name: Alexis [name] Date of Encounter: 03/25/2017  Primary Cardiologist: Hilty  Subjective   83 year old female with a history of chronic diastolic congestive heart failure, paroxysmal atrial fibrillation tension who was admitted with increasing shortness of breath.  Her echocardiogram shows normal left ventricular systolic function with grade 2 diastolic dysfunction.  She has mild aortic stenosis with a mean aortic valve gradient of 15 mmHg.  She has severe pulmonary hypertension with an estimated PA pressure of 70 mmHg.    Inpatient Medications    Scheduled Meds: . atorvastatin  20 mg Oral Daily  . cholecalciferol  1,000 Units Oral q morning - 10a  . escitalopram  10 mg Oral Daily  . feeding supplement (ENSURE ENLIVE)  237 mL Oral BID BM  . fenofibrate  160 mg Oral Daily  . gabapentin  100 mg Oral QHS  . guaiFENesin  600 mg Oral BID  . hydrALAZINE  75 mg Oral BID  . insulin aspart  0-15 Units Subcutaneous TID WC  . insulin aspart  0-5 Units Subcutaneous QHS  . insulin aspart  5 Units Subcutaneous TID WC  . insulin detemir  10 Units Subcutaneous Daily  . iron polysaccharides  150 mg Oral Daily  . isosorbide mononitrate  30 mg Oral Daily  . methimazole  5 mg Oral Daily  . metoprolol tartrate  25 mg Oral BID  . multivitamin with minerals  1 tablet Oral Daily  . ondansetron  4 mg Oral Q6H  . polyethylene glycol  17 g Oral Daily  . sodium chloride flush  3 mL Intravenous Q12H  . warfarin  4 mg Oral ONCE-1800  . Warfarin - Pharmacist Dosing Inpatient   Does not apply q1800   Continuous Infusions: . sodium chloride    . sodium chloride 50 mL/hr at 03/25/17 0905   PRN Meds: sodium chloride, acetaminophen **OR** acetaminophen, albuterol, ondansetron **OR** ondansetron (ZOFRAN) IV, sodium chloride flush, traZODone   Vital Signs    Vitals:   03/24/17 1916 03/24/17 2132 03/25/17 0416 03/25/17 0903  BP: (!) 135/40 (!) 130/38 (!) 131/38 (!)  125/42  Pulse: (!) 54 (!) 53 60   Resp: 18  18   Temp: (!) 97.3 F (36.3 C)  98 F (36.7 C)   TempSrc: Oral  Oral   SpO2: 98%  98%   Weight:   153 lb 3.2 oz (69.5 kg)   Height:        Intake/Output Summary (Last 24 hours) at 03/25/17 0924 Last data filed at 03/25/17 [id number]  Gross per 24 hour  Intake             1320 ml  Output              500 ml  Net              820 ml   Filed Weights   03/23/17 0347 03/24/17 0529 03/25/17 0416  Weight: 149 lb 9.6 oz (67.9 kg) 150 lb 9.6 oz (68.3 kg) 153 lb 3.2 oz (69.5 kg)    Telemetry    nsr - Personally Reviewed  ECG     nsr  - Personally Reviewed  Physical Exam   GEN: No acute distress.  Female.  She appears to be chronically ill.  Neck: No JVD Cardiac: RRR, 2- 3/6 systolic ejection murmur.  Rubs, or gallops.  Respiratory: Clear to auscultation bilaterally. GI: Soft, nontender, non-distended  MS: No edema; No deformity. Neuro:  Nonfocal  Psych:  Normal affect   Labs    Chemistry Recent Labs Lab 03/23/17 0404 03/24/17 0508 03/25/17 0545  NA 138 139 140  K 5.2* 5.3* 5.4*  CL 98* 102 106  CO2 31 30 27   GLUCOSE 236* 122* 105*  BUN 72* 66* 62*  CREATININE 3.26* 2.96* 2.75*  CALCIUM 8.4* 8.2* 7.9*  GFRNONAA 12* 14* 15*  GFRAA 14* 16* 17*  ANIONGAP 9 7 7      Hematology Recent Labs Lab 03/23/17 0404 03/24/17 0508 03/25/17 0545  WBC 8.7 8.7 6.4  RBC 3.03* 2.92* 2.87*  HGB 8.0* 7.9* 7.8*  HCT 26.9* 26.2* 25.7*  MCV 88.8 89.7 89.5  MCH 26.4 27.1 27.2  MCHC 29.7* 30.2 30.4  RDW 15.4 15.8* 15.6*  PLT 243 236 231    Cardiac Enzymes Recent Labs Lab 03/20/17 1130  TROPONINI <0.03    Recent Labs Lab 03/20/17 1143  TROPIPOC 0.00     BNP Recent Labs Lab 03/20/17 1130  BNP 710.5*     DDimer No results for input(s): DDIMER in the last 168 hours.   Radiology    No results found.  Cardiac Studies    Patient Profile     83 y.o. female acute diastolic congestive heart failure.  Assessment &  Plan    1.  Acute on chronic diastolic congestive heart failure.  Patient is feeling better. Her diuretics have been held for the past several days because of an acute rise in her creatinine.  She is feeling better.  Her net I/ O's are positive now for the admission.  Agree with restarting her home dose of diuretic and see how she does.  She needs to pay very close attention to salt restriction.  2.  Chronic kidney disease: Her current creatinine is 2.75.  Further plans per internal medicine team  3.  Paroxysmal atrial fibrillation: She is currently in sinus rhythm.  Continue Coumadin.  4.  Severe pulmonary hypertension: This is likely secondary to her COPD and possibly her chronic diastolic congestive heart failure  For questions or updates, please contact [facility] Please consult [email or web address] for contact info under Cardiology/STEMI.      Signed, Kristeen Miss, MD  03/25/2017, 9:24 AM

## 2017-03-25 NOTE — Progress Notes (Signed)
ANTICOAGULATION CONSULT NOTE   Pharmacy Consult for Warfarin Indication: atrial fibrillation  Allergies  Allergen Reactions  . Yellow Dyes (Non-Tartrazine) Nausea And Vomiting    "deathly allergic" No other information given to explain reaction. Per Daughter- this was an IV dye. Ok with yellow coloring on Coumadin tablets.   . Codeine Hives  . Other Other (See Comments)    novocaine broke mouth out  . Penicillins Hives  . Procaine Hives    Labs:  Recent Labs  03/23/17 0404 03/24/17 0508 03/25/17 0545  HGB 8.0* 7.9* 7.8*  HCT 26.9* 26.2* 25.7*  PLT 243 236 231  LABPROT 36.3* 25.2* 21.4*  INR 3.69 2.31 1.87  CREATININE 3.26* 2.96* 2.75*    Estimated Creatinine Clearance: 14.2 mL/min (A) (by C-G formula based on SCr of 2.75 mg/dL (H)).   Assessment: 66 YOF on warfarin PTA for AFib- Home dose (as per Coumadin Clinic note on 10/12) was 4mg  daily except 6mg  on Mondays- dose was reduced to that regimen during that appointment d/t INR of 3.6 in the clinic.  Coumadin last taken on 10/24 PTA,  Held dose since admission on 03/20/17 INR has trended down today to 1.87  Goal of Therapy:  INR 2-3 Monitor platelets by anticoagulation protocol: Yes   Plan:  Give warfarin  4 mg x1 today. Daily INR and CBC Follow s/s bleeding  Thank you Okey Regal, PharmD [phone] 03/25/2017 8:13 AM

## 2017-03-25 NOTE — Progress Notes (Signed)
Physical Therapy Treatment Patient Details Name: Alexis GivensChristine E [name] MRN: [id number] DOB: 03/27/1932 Today's Date: 03/25/2017    History of Present Illness Pt is an 83 y.o. female admitted 03/20/17 with SOB, orthopnea, weight gain, increased O2 requirement.  Patient with CHF exacerbation and UTI.   PMH:  CHF, PAF, DM, neuropathy, COPD on home O2 at 2L, HTN, CKD, anemia, macular degeneration, confusion.     PT Comments    Pt fatigues quickly with activity but vitals stable during session.  SPO2 4L [place] 95%.  Pt HR 67 bpm.  Pt is unsafe with obstacle negotiation in halls and remains to benefit from supervision at home for safety.     Follow Up Recommendations  Home health PT;Supervision for mobility/OOB     Equipment Recommendations  Other (comment) (light weight O2 for home)    Recommendations for Other Services       Precautions / Restrictions Precautions Precautions: None Restrictions Weight Bearing Restrictions: No    Mobility  Bed Mobility Overal bed mobility: Needs Assistance Bed Mobility: Supine to Sit;Sit to Supine     Supine to sit: Min assist;HOB elevated Sit to supine: Min assist   General bed mobility comments: +rail, assist to elevate trunk, verbal cues for sequencing, +2 max assist to boost [facility] as patient anxious when lying flat on bed.    Transfers Overall transfer level: Needs assistance Equipment used: Rolling walker (2 wheeled) Transfers: Sit to/from Stand Sit to Stand: Min guard         General transfer comment: verbal cues for hand placement and sequencing to and from seated surface. Pt required cues to pull RW back toward bed to improve safety with positioning before sitting.    Ambulation/Gait Ambulation/Gait assistance: Min guard Ambulation Distance (Feet): 120 Feet Assistive device: Rolling walker (2 wheeled) Gait Pattern/deviations: Step-through pattern;Decreased stride length Gait velocity: decreased Gait velocity  interpretation: Below normal speed for age/gender General Gait Details: Pt ambulated on 4L O2 with SpO2 94%.  Pt with noticeable DOE and increased respiration HR 67 bpm.  Cues for obstacle negotiation and posture during gait training.  Cues for pacing.     Stairs            Wheelchair Mobility    Modified Rankin (Stroke Patients Only)       Balance Overall balance assessment: Needs assistance   Sitting balance-Leahy Scale: Good       Standing balance-Leahy Scale: Poor                              Cognition Arousal/Alertness: Awake/alert Behavior During Therapy: WFL for tasks assessed/performed Overall Cognitive Status: Within Functional Limits for tasks assessed                                 General Comments: Patient disoriented to time and situation.      Exercises      General Comments        Pertinent Vitals/Pain Pain Assessment: No/denies pain    Home Living                      Prior Function            PT Goals (current goals can now be found in the care plan section) Acute Rehab PT Goals Patient Stated Goal: To return home Potential to Achieve  Goals: Good Progress towards PT goals: Progressing toward goals    Frequency    Min 3X/week      PT Plan Current plan remains appropriate    Co-evaluation              AM-PAC PT "6 Clicks" Daily Activity  Outcome Measure  Difficulty turning over in bed (including adjusting bedclothes, sheets and blankets)?: None Difficulty moving from lying on back to sitting on the side of the bed? : Unable Difficulty sitting down on and standing up from a chair with arms (e.g., wheelchair, bedside commode, etc,.)?: Unable Help needed moving to and from a bed to chair (including a wheelchair)?: A Little Help needed walking in hospital room?: A Little Help needed climbing 3-5 steps with a railing? : A Lot 6 Click Score: 14    End of Session Equipment Utilized  During Treatment: Gait belt;Oxygen Activity Tolerance: Patient tolerated treatment well Patient left: in chair;with family/visitor present;with call bell/phone within reach;with chair alarm set Nurse Communication: Mobility status PT Visit Diagnosis: Unsteadiness on feet (R26.81);Muscle weakness (generalized) (M62.81)     Time: 5697-9480 PT Time Calculation (min) (ACUTE ONLY): 26 min  Charges:  $Gait Training: 8-22 mins $Therapeutic Activity: 8-22 mins                    G Codes:       Joycelyn Rua, PTA pager [phone]    Florestine Avers 03/25/2017, 4:44 PM

## 2017-03-25 NOTE — Plan of Care (Signed)
Problem: Pain Managment: Goal: General experience of comfort will improve Outcome: Progressing Monitor patient pain using 0-10 pain scale

## 2017-03-25 NOTE — Progress Notes (Addendum)
Patient ID: Alexis [name], female   DOB: 10/22/1931, 83 y.o.   MRN: [id number]  PROGRESS NOTE    Alexis GivensChristine E [name]  QIO:[id number] DOB: 07/10/1931 DOA: 03/20/2017 PCP: Ladora DanielBeal, [name], PA-C   Brief Narrative:  83 year old female with history of chronic diastolic CHF, diabetes mellitus type 2, COPD, chronic hypoxic respiratory failure on 2-3 L oxygen at home, paroxysmal atrial fibrillation on anticoagulation, hypertension, chronic kidney disease stage IV presented with worsening shortness of breath and was admitted with intravenous Lasix for CHF exacerbation.   Assessment & Plan:   Principal Problem:   CHF (congestive heart failure), NYHA class I, unspecified failure chronicity, combined (HCC) Active Problems:   (HFpEF) heart failure with preserved ejection fraction (HCC)   Paroxysmal atrial fibrillation (HCC)   Chronic respiratory failure with hypoxia (HCC)   UTI (urinary tract infection)   Type II diabetes mellitus (HCC)   Chronic renal insufficiency, stage 3 (moderate) (HCC)  Acute on chronic diastolic heart failure - Lasix discontinued because of bump in creatinine. Lasix on hold for now.  - Spoke to Dr. Nahser/cardiology who recommends to restart home dose of diuretic and monitor renal function. We will restart torsemide 40 mg daily. DC iv fluids. Repeat creatinine for tomorrow.  - Strict input and output. Negative fluid balance of 383 mL since admission. Positive balance of 700 mL over the last 24 hours. Daily weights.  - Echo showed ejection fraction of 65-70% with grade 2 diastolic dysfunction.  - Continue hydralazine, metoprolol and isosorbide mononitrate.  Paroxysmal atrial fibrillation -Currently rate controlled with mild bradycardia. Continue metoprolol. Coumadin is being dosed per pharmacy.  Supratherapeutic INR - Resolved  Probable UTI/probable cystitis - Escherichia coli growing in urine cultures. Treated with antibiotics  Chronic hypoxic respiratory failure  with history of COPD - Continue oxygen supplementation.  - Stable  Hypertension - Monitor blood pressure. Continue metoprolol, hydralazine and isosorbide mononitrate  Acute kidney injury on Chronic kidney disease stage IV - Creatinine is 2.75 today. Plan as above   Diabetes mellitus type 2 uncontrolled with hyperglycemia -  Hemoglobin A1c 8.8. Patient is not on any oral medications at home for some reason.  - Continue Levemir 5 units daily and NovoLog along with sliding scale insulin. Outpatient follow-up with primary care provider regarding further management  Chronic anemia probably secondary anemia of chronic disease from chronic kidney disease - Stable. Monitor. No signs of bleeding  DVT prophylaxis: Coumadin  Code Status:  DNR Family Communication: None at bedside Disposition Plan: Home in 1-2 days once cleared by cardiology  Consultants: Cardiology  Procedures:  Study Conclusions  - Left ventricle: The cavity size was normal. Wall thickness was   normal. Systolic function was vigorous. The estimated ejection   fraction was in the range of 65% to 70%. Wall motion was normal;   there were no regional wall motion abnormalities. Features are   consistent with a pseudonormal left ventricular filling pattern,   with concomitant abnormal relaxation and increased filling   pressure (grade 2 diastolic dysfunction). - Aortic valve: Trileaflet; moderately thickened, moderately   calcified leaflets. There was mild stenosis. Peak velocity (S):   276 cm/s. Mean gradient (S): 15 mm Hg. Valve area (VTI): 1.69   cm^2. Valve area (Vmax): 1.76 cm^2. Valve area (Vmean): 1.64   cm^2. - Mitral valve: Moderately calcified annulus. Mildly thickened   leaflets . There was moderate regurgitation. - Left atrium: The atrium was moderately to severely dilated.   Volume/bsa, S: 49.5 ml/m^2. - Right atrium:  The atrium was moderately dilated. - Tricuspid valve: There was moderate regurgitation. -  Pulmonary arteries: Systolic pressure was severely increased. PA   peak pressure: 70 mm Hg (S).  Impressions:  - Compared to the prior study, there has been no significant   interval change.  Antimicrobials: Rocephin from 03/20/2017-03/22/2017 Keflex from 03/22/2017-03/23/17  Subjective: Patient seen and examined at bedside. She denies overnight fever, nausea or vomiting. She is a poor historian. No current chest pain.  Objective: Vitals:   03/24/17 1916 03/24/17 2132 03/25/17 0416 03/25/17 0903  BP: (!) 135/40 (!) 130/38 (!) 131/38 (!) 125/42  Pulse: (!) 54 (!) 53 60   Resp: 18  18   Temp: (!) 97.3 F (36.3 C)  98 F (36.7 C)   TempSrc: Oral  Oral   SpO2: 98%  98%   Weight:   69.5 kg (153 lb 3.2 oz)   Height:        Intake/Output Summary (Last 24 hours) at 03/25/17 1031 Last data filed at 03/25/17 3524  Gross per 24 hour  Intake              960 ml  Output              300 ml  Net              660 ml   Filed Weights   03/23/17 0347 03/24/17 0529 03/25/17 0416  Weight: 67.9 kg (149 lb 9.6 oz) 68.3 kg (150 lb 9.6 oz) 69.5 kg (153 lb 3.2 oz)    Examination:  General exam: Appears calm and comfortable; elderly female Respiratory system: Bilateral decreased breath sound at bases with basilar crackles Cardiovascular system: S1 & S2 heard, intermittent bradycardia Gastrointestinal system: Abdomen is nondistended, soft and nontender. Normal bowel sounds heard. Extremities: No cyanosis, clubbing; trace edema   Data Reviewed: I have personally reviewed following labs and imaging studies  CBC:  Recent Labs Lab 03/21/17 0506 03/22/17 0518 03/23/17 0404 03/24/17 0508 03/25/17 0545  WBC 11.1* 12.9* 8.7 8.7 6.4  NEUTROABS  --  10.9* 6.4 6.5 4.4  HGB 9.2* 8.3* 8.0* 7.9* 7.8*  HCT 30.0* 26.9* 26.9* 26.2* 25.7*  MCV 87.0 87.3 88.8 89.7 89.5  PLT 242 235 243 236 231   Basic Metabolic Panel:  Recent Labs Lab 03/21/17 0506 03/22/17 0518 03/23/17 0404  03/24/17 0508 03/25/17 0545  NA 138 139 138 139 140  K 4.4 4.8 5.2* 5.3* 5.4*  CL 100* 99* 98* 102 106  CO2 27 30 31 30 27   GLUCOSE 377* 89 236* 122* 105*  BUN 40* 56* 72* 66* 62*  CREATININE 2.04* 2.41* 3.26* 2.96* 2.75*  CALCIUM 8.9 8.7* 8.4* 8.2* 7.9*  MG  --  2.2 2.4 2.4 2.5*   GFR: Estimated Creatinine Clearance: 14.2 mL/min (A) (by C-G formula based on SCr of 2.75 mg/dL (H)). Liver Function Tests: No results for input(s): AST, ALT, ALKPHOS, BILITOT, PROT, ALBUMIN in the last 168 hours. No results for input(s): LIPASE, AMYLASE in the last 168 hours. No results for input(s): AMMONIA in the last 168 hours. Coagulation Profile:  Recent Labs Lab 03/21/17 0506 03/22/17 0518 03/23/17 0404 03/24/17 0508 03/25/17 0545  INR 5.29* 6.00* 3.69 2.31 1.87   Cardiac Enzymes:  Recent Labs Lab 03/20/17 1130  TROPONINI <0.03   BNP (last 3 results) No results for input(s): PROBNP in the last 8760 hours. HbA1C: No results for input(s): HGBA1C in the last 72 hours. CBG:  Recent Labs Lab 03/24/17 0755  03/24/17 1116 03/24/17 1627 03/24/17 2102 03/25/17 0736  GLUCAP 109* 181* 152* 128* 110*   Lipid Profile: No results for input(s): CHOL, HDL, LDLCALC, TRIG, CHOLHDL, LDLDIRECT in the last 72 hours. Thyroid Function Tests: No results for input(s): TSH, T4TOTAL, FREET4, T3FREE, THYROIDAB in the last 72 hours. Anemia Panel: No results for input(s): VITAMINB12, FOLATE, FERRITIN, TIBC, IRON, RETICCTPCT in the last 72 hours. Sepsis Labs: No results for input(s): PROCALCITON, LATICACIDVEN in the last 168 hours.  Recent Results (from the past 240 hour(s))  Urine Culture     Status: Abnormal   Collection Time: 03/20/17 12:40 PM  Result Value Ref Range Status   Specimen Description URINE, RANDOM  Final   Special Requests NONE  Final   Culture >=100,000 COLONIES/mL ESCHERICHIA COLI (A)  Final   Report Status 03/22/2017 FINAL  Final   Organism ID, Bacteria ESCHERICHIA COLI (A)   Final      Susceptibility   Escherichia coli - MIC*    AMPICILLIN <=2 SENSITIVE Sensitive     CEFAZOLIN <=4 SENSITIVE Sensitive     CEFTRIAXONE <=1 SENSITIVE Sensitive     CIPROFLOXACIN <=0.25 SENSITIVE Sensitive     GENTAMICIN <=1 SENSITIVE Sensitive     IMIPENEM <=0.25 SENSITIVE Sensitive     NITROFURANTOIN 256 RESISTANT Resistant     TRIMETH/SULFA <=20 SENSITIVE Sensitive     AMPICILLIN/SULBACTAM <=2 SENSITIVE Sensitive     PIP/TAZO <=4 SENSITIVE Sensitive     Extended ESBL NEGATIVE Sensitive     * >=100,000 COLONIES/mL ESCHERICHIA COLI         Radiology Studies: No results found.      Scheduled Meds: . atorvastatin  20 mg Oral Daily  . cholecalciferol  1,000 Units Oral q morning - 10a  . escitalopram  10 mg Oral Daily  . feeding supplement (ENSURE ENLIVE)  237 mL Oral BID BM  . fenofibrate  160 mg Oral Daily  . gabapentin  100 mg Oral QHS  . guaiFENesin  600 mg Oral BID  . hydrALAZINE  75 mg Oral BID  . insulin aspart  0-15 Units Subcutaneous TID WC  . insulin aspart  0-5 Units Subcutaneous QHS  . insulin aspart  5 Units Subcutaneous TID WC  . insulin detemir  10 Units Subcutaneous Daily  . iron polysaccharides  150 mg Oral Daily  . isosorbide mononitrate  30 mg Oral Daily  . methimazole  5 mg Oral Daily  . metoprolol tartrate  25 mg Oral BID  . multivitamin with minerals  1 tablet Oral Daily  . ondansetron  4 mg Oral Q6H  . polyethylene glycol  17 g Oral Daily  . sodium chloride flush  3 mL Intravenous Q12H  . warfarin  4 mg Oral ONCE-1800  . Warfarin - Pharmacist Dosing Inpatient   Does not apply q1800   Continuous Infusions: . sodium chloride    . sodium chloride 50 mL/hr at 03/25/17 0905     LOS: 5 days        Glade Lloyd, MD [facility] Pager [phone]  If 7PM-7AM, please contact night-coverage [email or web address] Password [email or web address] 03/25/2017, 10:31 AM

## 2017-03-26 DIAGNOSIS — I5033 Acute on chronic diastolic (congestive) heart failure: Secondary | ICD-10-CM

## 2017-03-26 DIAGNOSIS — N179 Acute kidney failure, unspecified: Secondary | ICD-10-CM

## 2017-03-26 DIAGNOSIS — I2721 Secondary pulmonary arterial hypertension: Secondary | ICD-10-CM

## 2017-03-26 DIAGNOSIS — J439 Emphysema, unspecified: Secondary | ICD-10-CM

## 2017-03-26 LAB — GLUCOSE, CAPILLARY
GLUCOSE-CAPILLARY: 89 mg/dL (ref 65–99)
GLUCOSE-CAPILLARY: 232 mg/dL — AB (ref 65–99)
GLUCOSE-CAPILLARY: 150 mg/dL — AB (ref 65–99)
Glucose-Capillary: 135 mg/dL — ABNORMAL HIGH (ref 65–99)

## 2017-03-26 LAB — CBC WITH DIFFERENTIAL/PLATELET
Basophils Absolute: 0 10*3/uL (ref 0.0–0.1)
Basophils Relative: 0 %
Eosinophils Absolute: 0.7 10*3/uL (ref 0.0–0.7)
Eosinophils Relative: 10 %
HEMATOCRIT: 24.7 % — AB (ref 36.0–46.0)
HEMOGLOBIN: 7.4 g/dL — AB (ref 12.0–15.0)
LYMPHS ABS: 0.8 10*3/uL (ref 0.7–4.0)
Lymphocytes Relative: 11 %
MCH: 26.9 pg (ref 26.0–34.0)
MCHC: 30 g/dL (ref 30.0–36.0)
MCV: 89.8 fL (ref 78.0–100.0)
MONOS PCT: 11 %
Monocytes Absolute: 0.7 10*3/uL (ref 0.1–1.0)
NEUTROS ABS: 4.6 10*3/uL (ref 1.7–7.7)
NEUTROS PCT: 68 %
Platelets: 223 10*3/uL (ref 150–400)
RBC: 2.75 MIL/uL — ABNORMAL LOW (ref 3.87–5.11)
RDW: 15.9 % — ABNORMAL HIGH (ref 11.5–15.5)
WBC: 6.7 10*3/uL (ref 4.0–10.5)

## 2017-03-26 LAB — BASIC METABOLIC PANEL
Anion gap: 6 (ref 5–15)
BUN: 60 mg/dL — ABNORMAL HIGH (ref 6–20)
CHLORIDE: 104 mmol/L (ref 101–111)
CO2: 29 mmol/L (ref 22–32)
CREATININE: 2.99 mg/dL — AB (ref 0.44–1.00)
Calcium: 7.8 mg/dL — ABNORMAL LOW (ref 8.9–10.3)
GFR calc Af Amer: 16 mL/min — ABNORMAL LOW (ref >60)
GFR calc non Af Amer: 13 mL/min — ABNORMAL LOW (ref >60)
Glucose, Bld: 110 mg/dL — ABNORMAL HIGH (ref 65–99)
Potassium: 5.1 mmol/L (ref 3.5–5.1)
Sodium: 139 mmol/L (ref 135–145)

## 2017-03-26 LAB — MAGNESIUM: Magnesium: 2.6 mg/dL — ABNORMAL HIGH (ref 1.7–2.4)

## 2017-03-26 LAB — PROTIME-INR
INR: 1.84
Prothrombin Time: 21.1 s — ABNORMAL HIGH (ref 11.4–15.2)

## 2017-03-26 MED ORDER — WARFARIN SODIUM 2 MG PO TABS
4.0000 mg | ORAL_TABLET | Freq: Once | ORAL | Status: AC
  Administered 2017-03-26: 4 mg via ORAL
  Filled 2017-03-26: qty 2

## 2017-03-26 NOTE — Care Management Important Message (Signed)
Important Message  Patient Details  Name: Alexis [name] MRN: [id number] Date of Birth: Feb 02, 1932   Medicare Important Message Given:  Yes    [name] 03/26/2017, 12:27 PM

## 2017-03-26 NOTE — Progress Notes (Signed)
ANTICOAGULATION CONSULT NOTE   Pharmacy Consult for Warfarin Indication: atrial fibrillation  Allergies  Allergen Reactions  . Yellow Dyes (Non-Tartrazine) Nausea And Vomiting    "deathly allergic" No other information given to explain reaction. Per Daughter- this was an IV dye. Ok with yellow coloring on Coumadin tablets.   . Codeine Hives  . Other Other (See Comments)    novocaine broke mouth out  . Penicillins Hives  . Procaine Hives    Labs:  Recent Labs  03/24/17 0508 03/25/17 0545 03/26/17 0429  HGB 7.9* 7.8* 7.4*  HCT 26.2* 25.7* 24.7*  PLT 236 231 223  LABPROT 25.2* 21.4* 21.1*  INR 2.31 1.87 1.84  CREATININE 2.96* 2.75* 2.99*    Estimated Creatinine Clearance: 13.1 mL/min (A) (by C-G formula based on SCr of 2.99 mg/dL (H)).   Assessment: 33 YOF on warfarin PTA for AFib- Home dose (as per Coumadin Clinic note on 10/12) was 4mg  daily except 6mg  on Mondays- dose was reduced to that regimen during that appointment d/t INR of 3.6 in the clinic.  Coumadin last taken on 10/24 PTA,  Held dose since admission on 03/20/17 INR has trended down today to 1.84  Goal of Therapy:  INR 2-3 Monitor platelets by anticoagulation protocol: Yes   Plan:  Repeat warfarin  4 mg po x1 today. Daily INR and CBC Follow s/s bleeding  Thank you Okey Regal, PharmD [phone] 03/26/2017 8:24 AM

## 2017-03-26 NOTE — Progress Notes (Signed)
PROGRESS NOTE  Alexis GivensChristine E [name] [id number] DOB: 09/01/1931 DOA: 03/20/2017 PCP: Ladora DanielBeal, [name], PA-C  Brief Narrative: 83 year old woman PMH diastolic heart failure, oxygen dependent COPD presented with increasing shortness of breath, orthopnea and worsening hypoxia.  Admitted for acute on chronic diastolic congestive heart failure, acute on chronic hypoxic respiratory failure.  Assessment/Plan Acute on chronic diastolic congestive heart failure -Some crackles on examination.  No lower extremity edema. -Patient asymptomatic.  Continue oral torsemide per cardiology.  Acute on chronic hypoxic respiratory failure -Acute issue appears resolved.  AKI superimposed on CKD stage IV -Slightly above baseline.  No significant change compared to 48 hours ago. -Repeat BMP in a.m. -Potassium within normal limits.  No uremia noted.  E coli UTI -treated with abx  COPD -Appears stable.  Anemia of chronic kidney disease -Slightly lower today.  Plan repeat CBC in the morning.  Follow clinically.  No evidence of bleeding.  Atrial fibrillation, CHA2DS2-VASc 6. -warfarin per pharmacy   Diabetes mellitus type 2. Hgb A1c 8.8. -Blood sugar stable.  Severe pulmonary HTN  DVT prophylaxis: warfarin Code Status: DNR Family Communication: daughter at bedside Disposition Plan: HH PT   Brendia Sacksaniel Goodrich, MD  [facility] Direct contact: [phone] --Via amion app OR  --[email or web address]; password [email or web address]  7PM-7AM contact night coverage as above 03/26/2017, 5:52 PM  LOS: 6 days   Consultants:  Cardiology   Procedures:  Echo Study Conclusions  - Left ventricle: The cavity size was normal. Wall thickness was   normal. Systolic function was vigorous. The estimated ejection   fraction was in the range of 65% to 70%. Wall motion was normal;   there were no regional wall motion abnormalities. Features are   consistent with a pseudonormal left ventricular filling pattern,   with  concomitant abnormal relaxation and increased filling   pressure (grade 2 diastolic dysfunction). - Aortic valve: Trileaflet; moderately thickened, moderately   calcified leaflets. There was mild stenosis. Peak velocity (S):   276 cm/s. Mean gradient (S): 15 mm Hg. Valve area (VTI): 1.69   cm^2. Valve area (Vmax): 1.76 cm^2. Valve area (Vmean): 1.64   cm^2. - Mitral valve: Moderately calcified annulus. Mildly thickened   leaflets . There was moderate regurgitation. - Left atrium: The atrium was moderately to severely dilated.   Volume/bsa, S: 49.5 ml/m^2. - Right atrium: The atrium was moderately dilated. - Tricuspid valve: There was moderate regurgitation. - Pulmonary arteries: Systolic pressure was severely increased. PA   peak pressure: 70 mm Hg (S).  Impressions:  - Compared to the prior study, there has been no significant   interval change.  Antimicrobials:  Ceftriaxone 10/25 >> 10/28  Interval history/Subjective: No chest pain or shortness of breath.  No complaints.  Objective: Vitals: Afebrile, vital signs stable.  98.2. 18.  50.  121/41.  99% on 2 L.  Exam:     Constitutional: Appears calm, comfortable.  Respiratory.  Few expiratory crackles, otherwise clear.  No wheezes or rhonchi.  Normal respiratory effort.  Cardiovascular.  Regular rate and rhythm.  No murmur, rub or gallop.  No lower extremity edema.  Telemetry sinus rhythm.  Psychiatric.  Grossly normal mood and affect.  Speech fluent and appropriate.   I have personally reviewed the following:   Labs:  Capillary blood sugar stable  Potassium within normal limits.  BUN and creatinine without significant change, 60/2.99.  Hemoglobin stable 7.4.  WBC and platelets within normal limits.  INR 1.84  Hemoglobin A1c 8.8  Imaging studies:  Diffuse bilateral airspace  disease seen on chest x-ray   Scheduled Meds: . atorvastatin  20 mg Oral Daily  . cholecalciferol  1,000 Units Oral q morning -  10a  . escitalopram  10 mg Oral Daily  . feeding supplement (ENSURE ENLIVE)  237 mL Oral BID BM  . fenofibrate  160 mg Oral Daily  . gabapentin  100 mg Oral QHS  . guaiFENesin  600 mg Oral BID  . hydrALAZINE  75 mg Oral BID  . insulin aspart  0-15 Units Subcutaneous TID WC  . insulin aspart  0-5 Units Subcutaneous QHS  . insulin aspart  5 Units Subcutaneous TID WC  . insulin detemir  10 Units Subcutaneous Daily  . iron polysaccharides  150 mg Oral Daily  . isosorbide mononitrate  30 mg Oral Daily  . methimazole  5 mg Oral Daily  . metoprolol tartrate  25 mg Oral BID  . multivitamin with minerals  1 tablet Oral Daily  . ondansetron  4 mg Oral Q6H  . polyethylene glycol  17 g Oral Daily  . sodium chloride flush  3 mL Intravenous Q12H  . torsemide  40 mg Oral Daily  . Warfarin - Pharmacist Dosing Inpatient   Does not apply q1800   Continuous Infusions: . sodium chloride      Active Problems:   COPD (chronic obstructive pulmonary disease) (HCC)   Paroxysmal atrial fibrillation (HCC)   Long term current use of anticoagulant therapy   Chronic respiratory failure with hypoxia (HCC)   Type II diabetes mellitus (HCC)   AKI (acute kidney injury) (HCC)   CKD (chronic kidney disease), stage IV (HCC)   Acute on chronic diastolic CHF (congestive heart failure) (HCC)   Severe pulmonary arterial systolic hypertension (HCC)   LOS: 6 days

## 2017-03-26 NOTE — Progress Notes (Addendum)
Inpatient Diabetes Program Recommendations  AACE/ADA: New Consensus Statement on Inpatient Glycemic Control (2015)  Target Ranges:  Prepandial:   less than 140 mg/dL      Peak postprandial:   less than 180 mg/dL (1-2 hours)      Critically ill patients:  140 - 180 mg/dL   Results for Alexis [name], Alexis [name] (MRN [id number]) as of 03/26/2017 08:54  Ref. Range 03/25/2017 07:36 03/25/2017 11:47 03/25/2017 16:40 03/25/2017 21:48  Glucose-Capillary Latest Ref Range: 65 - 99 mg/dL 224 (H) 825 (H) 003 (H) 198 (H)   Results for Alexis [name], Alexis [name] (MRN [id number]) as of 03/26/2017 08:54  Ref. Range 03/26/2017 07:44  Glucose-Capillary Latest Ref Range: 65 - 99 mg/dL 89    Home DM Meds: None  Current Insulin Orders: Levemir 10 units daily      Novolog Moderate Correction Scale/ SSI (0-15 units) TID AC + HS      Novolog 5 units TID with meals     MD- Please consider the following in-hospital insulin adjustments:  1. Decrease Levemir to 5 units daily  2. Patient may need low dose oral diabetes medication at time of discharge??  Current A1c is 8.8%.  Stopped Glipizide per patient report.  Not sure when patient stopped Glipizide at home?  Per haps patient would do OK with a DPP-4 inhibitor at home like Tradjenta 5 mg daily.  Jearld Lesch does not need to be renally dosed and has low risk of Hypoglycemia due to it functioning in a glucose dependent manner.     --Will follow patient during hospitalization--  Ambrose Finland RN, MSN, CDE Diabetes Coordinator Inpatient Glycemic Control Team Team Pager: [phone] (8a-5p)

## 2017-03-26 NOTE — Progress Notes (Signed)
Progress Note  Patient Name: Alexis [name] Date of Encounter: 03/26/2017  Primary Cardiologist: Dr. [name]   Subjective   No complaints today. Notes she feels better. Breathing overall much improved. No pain.   Inpatient Medications    Scheduled Meds: . atorvastatin  20 mg Oral Daily  . cholecalciferol  1,000 Units Oral q morning - 10a  . escitalopram  10 mg Oral Daily  . feeding supplement (ENSURE ENLIVE)  237 mL Oral BID BM  . fenofibrate  160 mg Oral Daily  . gabapentin  100 mg Oral QHS  . guaiFENesin  600 mg Oral BID  . hydrALAZINE  75 mg Oral BID  . insulin aspart  0-15 Units Subcutaneous TID WC  . insulin aspart  0-5 Units Subcutaneous QHS  . insulin aspart  5 Units Subcutaneous TID WC  . insulin detemir  10 Units Subcutaneous Daily  . iron polysaccharides  150 mg Oral Daily  . isosorbide mononitrate  30 mg Oral Daily  . methimazole  5 mg Oral Daily  . metoprolol tartrate  25 mg Oral BID  . multivitamin with minerals  1 tablet Oral Daily  . ondansetron  4 mg Oral Q6H  . polyethylene glycol  17 g Oral Daily  . sodium chloride flush  3 mL Intravenous Q12H  . torsemide  40 mg Oral Daily  . Warfarin - Pharmacist Dosing Inpatient   Does not apply q1800   Continuous Infusions: . sodium chloride     PRN Meds: sodium chloride, acetaminophen **OR** acetaminophen, albuterol, ondansetron **OR** ondansetron (ZOFRAN) IV, sodium chloride flush, traZODone   Vital Signs    Vitals:   03/25/17 1148 03/25/17 2100 03/25/17 2206 03/26/17 0529  BP: (!) 113/39 (!) 117/37  (!) 116/40  Pulse: (!) 55 (!) 53 64 64  Resp:  18  18  Temp:  97.8 F (36.6 C)  98.4 F (36.9 C)  TempSrc:  Oral  Oral  SpO2: 98% 98%  99%  Weight:    155 lb 3.2 oz (70.4 kg)  Height:        Intake/Output Summary (Last 24 hours) at 03/26/17 0745 Last data filed at 03/26/17 0533  Gross per 24 hour  Intake              480 ml  Output              200 ml  Net              280 ml   Filed  Weights   03/24/17 0529 03/25/17 0416 03/26/17 0529  Weight: 150 lb 9.6 oz (68.3 kg) 153 lb 3.2 oz (69.5 kg) 155 lb 3.2 oz (70.4 kg)    Telemetry    NSR mid 60s - Personally Reviewed  ECG    NSR 92 bpm - Personally Reviewed  Physical Exam   GEN: No acute distress.  elderly Neck: No JVD Cardiac: RRR, 2/6 SM at RU and LUSB, rubs, or gallops.  Respiratory: decreased BS bilaterally with bibasilar crackles GI: Soft, nontender, non-distended  MS: No edema; No deformity. Neuro:  Nonfocal  Psych: Normal affect   Labs    Chemistry Recent Labs Lab 03/24/17 0508 03/25/17 0545 03/26/17 0429  NA 139 140 139  K 5.3* 5.4* 5.1  CL 102 106 104  CO2 30 27 29   GLUCOSE 122* 105* 110*  BUN 66* 62* 60*  CREATININE 2.96* 2.75* 2.99*  CALCIUM 8.2* 7.9* 7.8*  GFRNONAA 14* 15* 13*  GFRAA 16*  17* 16*  ANIONGAP 7 7 6      Hematology Recent Labs Lab 03/24/17 0508 03/25/17 0545 03/26/17 0429  WBC 8.7 6.4 6.7  RBC 2.92* 2.87* 2.75*  HGB 7.9* 7.8* 7.4*  HCT 26.2* 25.7* 24.7*  MCV 89.7 89.5 89.8  MCH 27.1 27.2 26.9  MCHC 30.2 30.4 30.0  RDW 15.8* 15.6* 15.9*  PLT 236 231 223    Cardiac Enzymes Recent Labs Lab 03/20/17 1130  TROPONINI <0.03    Recent Labs Lab 03/20/17 1143  TROPIPOC 0.00     BNP Recent Labs Lab 03/20/17 1130  BNP 710.5*     DDimer No results for input(s): DDIMER in the last 168 hours.   Radiology    No results found.  Cardiac Studies   2D Echo 03/21/17 Study Conclusions  - Left ventricle: The cavity size was normal. Wall thickness was   normal. Systolic function was vigorous. The estimated ejection   fraction was in the range of 65% to 70%. Wall motion was normal;   there were no regional wall motion abnormalities. Features are   consistent with a pseudonormal left ventricular filling pattern,   with concomitant abnormal relaxation and increased filling   pressure (grade 2 diastolic dysfunction). - Aortic valve: Trileaflet; moderately  thickened, moderately   calcified leaflets. There was mild stenosis. Peak velocity (S):   276 cm/s. Mean gradient (S): 15 mm Hg. Valve area (VTI): 1.69   cm^2. Valve area (Vmax): 1.76 cm^2. Valve area (Vmean): 1.64   cm^2. - Mitral valve: Moderately calcified annulus. Mildly thickened   leaflets . There was moderate regurgitation. - Left atrium: The atrium was moderately to severely dilated.   Volume/bsa, S: 49.5 ml/m^2. - Right atrium: The atrium was moderately dilated. - Tricuspid valve: There was moderate regurgitation. - Pulmonary arteries: Systolic pressure was severely increased. PA   peak pressure: 70 mm Hg (S).  Impressions:  - Compared to the prior study, there has been no significant   interval change.  Patient Profile     Chronic diastolic CHF, PAF (CHADSVASc 6, s/p DCCV); anemia, CKD, HTN with mod LVH, HL, DM, GI bleed, COPD who presented to ED 03/20/17 with complaint of increased dyspnea. Cardiology consulted for management of acute on chronic diastolic CHF.   Admit BNP 710.  Admit Weight 150 lb (? Accuracy of daily weight trend) 2D echo 10/26 >> EF 65-70%, G2DD, mild AS, severe PH  Assessment & Plan    1. Acute on Chronic Diastolic CHF:  Pt notes subjective improvement. Breathing better. IV Lasix was held for several days given rise in SCr. PO Toresimide resumed yesterday. Fluid balance balance went from + to -, however still with bibasilar crackles on exam, that do not clear with cough. No LEE. Weight is up, however ?accuracy of daily weight trend. Admit weight documented at 150 lb. Weight today documented at 155 lb. SCr is back up from 2.7>>2.9 However, with crackles on exam, would continue w/ PO diuretic today. Continue to monitor. BP is well controlled. HR stable. SR on tele. Low salt diet advised.   2. CKD: SCr jumped back up after restart of PO diuretics. SCr 2.75>>2.99 in the past 24 hrs. C/w with baseline over the last 1-2 years. She has had levels in the 2-3  range.   3. PAF:  NSR on tele with rate in the mid 60s. Continue metoprolol for rate control. On chronic coumadin for a/c. INR is supratherapeutic at 1.84. Pharmacy is assisting with dosing.   4.  Severe Pulmonary HTN: PA pressure on recent 2D echo was 70 mm hg. Likely secondary to COPD and diastolic CHF.   5. Aortic Stenosis: mild by recent echo. Mean gradient 15 mm Hg.   6. Hyperkalemia: resolved today. K down from 5.4>>5.1.   7. HTN: stabled and controlled on current regimen.   8. Anemia: chronic. Likely 2/2 CKD. Hgb is fairly stable at 7.4. Continue to monitor.   9. COPD: management per IM.   For questions or updates, please contact [facility] Please consult [email or web address] for contact info under Cardiology/STEMI.      Signed, Robbie Lis, PA-C  03/26/2017, 7:45 AM    Attending Note:   The patient was seen and examined.  Agree with assessment and plan as noted above.  Changes made to the above note as needed.  Patient seen and independently examined with Boyce Medici, PA .   We discussed all aspects of the encounter. I agree with the assessment and plan as stated above.  1.  Acute on chronic diastolic congestive heart failure: Mrs. Huss seems to be feeling better.  She has diuresed some.  Unfortunately, her creatinine continues to rise and is now 2.99.  We may need the assistance of nephrology.  I am not sure if she is a candidate for dialysis or not.  2.  Anemia: Her hemoglobin is 7.4.  Further plans and management per the internal medicine team.  3.  Severe pulmonary hypertension: This is secondary to her COPD with possibly some contribution to her diastolic heart failure.  4.  Paroxysmal atrial fibrillation: Currently in sinus rhythm.   I have spent a total of 40 minutes with patient reviewing hospital  notes , telemetry, EKGs, labs and examining patient as well as establishing an assessment and plan that was discussed with the patient. > 50% of time was  spent in direct patient care.    Vesta Mixer, Montez Hageman., MD, [facility] 03/26/2017, 9:22 AM [address]  [address] Office [phone] Pager [phone]

## 2017-03-27 ENCOUNTER — Inpatient Hospital Stay (HOSPITAL_COMMUNITY): Payer: Medicare Other

## 2017-03-27 DIAGNOSIS — I2721 Secondary pulmonary arterial hypertension: Secondary | ICD-10-CM

## 2017-03-27 LAB — CBC
HCT: 26 % — ABNORMAL LOW (ref 36.0–46.0)
Hemoglobin: 7.8 g/dL — ABNORMAL LOW (ref 12.0–15.0)
MCH: 27 pg (ref 26.0–34.0)
MCHC: 30 g/dL (ref 30.0–36.0)
MCV: 90 fL (ref 78.0–100.0)
PLATELETS: 221 10*3/uL (ref 150–400)
RBC: 2.89 MIL/uL — AB (ref 3.87–5.11)
RDW: 16.1 % — ABNORMAL HIGH (ref 11.5–15.5)
WBC: 8.3 10*3/uL (ref 4.0–10.5)

## 2017-03-27 LAB — GLUCOSE, CAPILLARY
GLUCOSE-CAPILLARY: 80 mg/dL (ref 65–99)
Glucose-Capillary: 238 mg/dL — ABNORMAL HIGH (ref 65–99)
Glucose-Capillary: 224 mg/dL — ABNORMAL HIGH (ref 65–99)
Glucose-Capillary: 120 mg/dL — ABNORMAL HIGH (ref 65–99)

## 2017-03-27 LAB — BASIC METABOLIC PANEL
Anion gap: 7 (ref 5–15)
BUN: 60 mg/dL — ABNORMAL HIGH (ref 6–20)
CALCIUM: 8 mg/dL — AB (ref 8.9–10.3)
CO2: 29 mmol/L (ref 22–32)
CREATININE: 3.28 mg/dL — AB (ref 0.44–1.00)
Chloride: 104 mmol/L (ref 101–111)
GFR calc non Af Amer: 12 mL/min — ABNORMAL LOW (ref >60)
GFR, EST AFRICAN AMERICAN: 14 mL/min — AB (ref >60)
Glucose, Bld: 78 mg/dL (ref 65–99)
Potassium: 5 mmol/L (ref 3.5–5.1)
SODIUM: 140 mmol/L (ref 135–145)

## 2017-03-27 LAB — PROTIME-INR
INR: 2.16
PROTHROMBIN TIME: 23.9 s — AB (ref 11.4–15.2)

## 2017-03-27 MED ORDER — WARFARIN SODIUM 2 MG PO TABS
4.0000 mg | ORAL_TABLET | Freq: Every day | ORAL | Status: DC
  Administered 2017-03-27: 4 mg via ORAL
  Filled 2017-03-27: qty 2

## 2017-03-27 MED ORDER — INSULIN DETEMIR 100 UNIT/ML ~~LOC~~ SOLN
5.0000 [IU] | Freq: Every day | SUBCUTANEOUS | Status: DC
  Administered 2017-03-28 – 2017-04-08 (×12): 5 [IU] via SUBCUTANEOUS
  Filled 2017-03-27 (×12): qty 0.05

## 2017-03-27 MED ORDER — SODIUM CHLORIDE 0.9 % IV SOLN
INTRAVENOUS | Status: AC
  Administered 2017-03-27: 12:00:00 via INTRAVENOUS

## 2017-03-27 NOTE — Progress Notes (Addendum)
Progress Note  Patient Name: Alexis [name] Date of Encounter: 03/27/2017  Primary Cardiologist: Hilty   Subjective   83 year old female with a history of chronic diastolic congestive heart failure, paroxysmal atrial fibrillation and pulmonary hypertension.  She has COPD.  Has creatinine of 3.28 today   Inpatient Medications    Scheduled Meds: . atorvastatin  20 mg Oral Daily  . cholecalciferol  1,000 Units Oral q morning - 10a  . escitalopram  10 mg Oral Daily  . feeding supplement (ENSURE ENLIVE)  237 mL Oral BID BM  . fenofibrate  160 mg Oral Daily  . gabapentin  100 mg Oral QHS  . guaiFENesin  600 mg Oral BID  . hydrALAZINE  75 mg Oral BID  . insulin aspart  0-15 Units Subcutaneous TID WC  . insulin aspart  0-5 Units Subcutaneous QHS  . insulin aspart  5 Units Subcutaneous TID WC  . [START ON 03/28/2017] insulin detemir  5 Units Subcutaneous Daily  . iron polysaccharides  150 mg Oral Daily  . isosorbide mononitrate  30 mg Oral Daily  . methimazole  5 mg Oral Daily  . metoprolol tartrate  25 mg Oral BID  . multivitamin with minerals  1 tablet Oral Daily  . ondansetron  4 mg Oral Q6H  . polyethylene glycol  17 g Oral Daily  . sodium chloride flush  3 mL Intravenous Q12H  . torsemide  40 mg Oral Daily  . warfarin  4 mg Oral q1800  . Warfarin - Pharmacist Dosing Inpatient   Does not apply q1800   Continuous Infusions: . sodium chloride    . sodium chloride     PRN Meds: sodium chloride, acetaminophen **OR** acetaminophen, albuterol, ondansetron **OR** ondansetron (ZOFRAN) IV, sodium chloride flush, traZODone   Vital Signs    Vitals:   03/27/17 0546 03/27/17 0800 03/27/17 1110 03/27/17 1115  BP: (!) 155/43 (!) 147/57    Pulse: 65 67    Resp: 18     Temp: 98 F (36.7 C)     TempSrc: Oral     SpO2: 96% 100% 100% 95%  Weight: 156 lb 6.4 oz (70.9 kg)     Height:        Intake/Output Summary (Last 24 hours) at 03/27/17 1127 Last data filed at 03/27/17  0958  Gross per 24 hour  Intake              960 ml  Output              981 ml  Net              -21 ml   Filed Weights   03/25/17 0416 03/26/17 0529 03/27/17 0546  Weight: 153 lb 3.2 oz (69.5 kg) 155 lb 3.2 oz (70.4 kg) 156 lb 6.4 oz (70.9 kg)    Telemetry    NSR  - Personally Reviewed  ECG     NSR  - Personally Reviewed  Physical Exam   GEN: No acute distress.   Neck: No JVD Cardiac: RRR, no murmurs, rubs, or gallops.  Respiratory: Clear to auscultation bilaterally. GI: Soft, nontender, non-distended  MS: No edema; No deformity. Neuro:  Nonfocal  Psych: Normal affect   Labs    Chemistry Recent Labs Lab 03/25/17 0545 03/26/17 0429 03/27/17 0537  NA 140 139 140  K 5.4* 5.1 5.0  CL 106 104 104  CO2 27 29 29   GLUCOSE 105* 110* 78  BUN 62* 60* 60*  CREATININE 2.75* 2.99* 3.28*  CALCIUM 7.9* 7.8* 8.0*  GFRNONAA 15* 13* 12*  GFRAA 17* 16* 14*  ANIONGAP 7 6 7      Hematology Recent Labs Lab 03/25/17 0545 03/26/17 0429 03/27/17 0537  WBC 6.4 6.7 8.3  RBC 2.87* 2.75* 2.89*  HGB 7.8* 7.4* 7.8*  HCT 25.7* 24.7* 26.0*  MCV 89.5 89.8 90.0  MCH 27.2 26.9 27.0  MCHC 30.4 30.0 30.0  RDW 15.6* 15.9* 16.1*  PLT 231 223 221    Cardiac Enzymes Recent Labs Lab 03/20/17 1130  TROPONINI <0.03    Recent Labs Lab 03/20/17 1143  TROPIPOC 0.00     BNP Recent Labs Lab 03/20/17 1130  BNP 710.5*     DDimer No results for input(s): DDIMER in the last 168 hours.   Radiology    No results found.  Cardiac Studies     Patient Profile     83 y.o. female with acute on chronic diastolic congestive heart failure.  She has significant chronic kidney disease.  Assessment & Plan    1.  Acute on chronic diastolic congestive heart failure: Patient has not diuresed much. No significant leg edema  Has rales in her lungs She has no complaints of dyspnea She is not a candidate for advanced CHF therapies  Agree with further eval of her kidneys  2.  CKD -  stage  IV  - further plans per Int. Med team  Discussed with Dr. Irene LimboGoodrich.   He would like to see if some IV fluids will help with the creatinine .  3.  Paroxysmal atrial fib :   In NSR .   4.  Pulmonary HTN:  Severe by echo  ? Due to COPD    For questions or updates, please contact [facility] Please consult [email or web address] for contact info under Cardiology/STEMI.      Signed, Kristeen MissPhilip Nahser, MD  03/27/2017, 11:27 AM

## 2017-03-27 NOTE — Progress Notes (Signed)
ANTICOAGULATION CONSULT NOTE   Pharmacy Consult for Warfarin Indication: atrial fibrillation  Allergies  Allergen Reactions  . Yellow Dyes (Non-Tartrazine) Nausea And Vomiting    "deathly allergic" No other information given to explain reaction. Per Daughter- this was an IV dye. Ok with yellow coloring on Coumadin tablets.   . Codeine Hives  . Other Other (See Comments)    novocaine broke mouth out  . Penicillins Hives  . Procaine Hives    Labs:  Recent Labs  03/25/17 0545 03/26/17 0429 03/27/17 0537  HGB 7.8* 7.4* 7.8*  HCT 25.7* 24.7* 26.0*  PLT 231 223 221  LABPROT 21.4* 21.1* 23.9*  INR 1.87 1.84 2.16  CREATININE 2.75* 2.99* 3.28*    Estimated Creatinine Clearance: 12 mL/min (A) (by C-G formula based on SCr of 3.28 mg/dL (H)).   Assessment: 21 YOF on warfarin PTA for AFib- Home dose (as per Coumadin Clinic note on 10/12) was 4mg  daily except 6mg  on Mondays- dose was reduced to that regimen during that appointment d/t INR of 3.6 in the clinic.  Coumadin last taken on 10/24 PTA,  Held dose since admission on 03/20/17 INR = 2.16 today  Goal of Therapy:  INR 2-3 Monitor platelets by anticoagulation protocol: Yes   Plan:  Warfarin 4 mg po daily Daily INR and CBC Follow s/s bleeding  Thank you Okey Regal, PharmD [phone] 03/27/2017 8:04 AM

## 2017-03-27 NOTE — Progress Notes (Signed)
Physical Therapy Treatment Patient Details Name: Alexis [name] MRN: [id number] DOB: 07/31/1931 Today's Date: 03/27/2017    History of Present Illness Pt is an 83 y.o. female admitted 03/20/17 with SOB, orthopnea, weight gain, increased O2 requirement.  Patient with CHF exacerbation and UTI.   PMH:  CHF, PAF, DM, neuropathy, COPD on home O2 at 2L, HTN, CKD, anemia, macular degeneration, confusion.     PT Comments    Pt in bed upon arrival. Pt able to sit up in bed using bed rail; VCs for hand placement. Ambulated greater distance with 4 L/min O2 Rol[place]. Monitored O2 sat; at or above 94%. Pt fatigued by ambulating and encouraged pursed lip breathing as walking. Required visual and verbal cues to avoid obstacles with RW. Able to maintain posture with VCs. Pt returned to bed upon request. Pt able to scoot up in bed with HOB tilted downward and VCs for hand placement on rails.  Follow Up Recommendations  Home health PT;Supervision for mobility/OOB     Equipment Recommendations  Other (comment)    Recommendations for Other Services       Precautions / Restrictions Precautions Precautions: None Restrictions Weight Bearing Restrictions: No    Mobility  Bed Mobility Overal bed mobility: Needs Assistance Bed Mobility: Supine to Sit;Sit to Supine     Supine to sit: Min assist;HOB elevated Sit to supine: Min assist   General bed mobility comments: + rail, assist to move legs up onto bed sit to supine, VCs for sequencing HOB tilted down to scoot up in bed  Transfers Overall transfer level: Needs assistance Equipment used: Rolling walker (2 wheeled) Transfers: Sit to/from Stand Sit to Stand: Min guard         General transfer comment: VCs for hand placement and sequencing to/from seated position. VCs required to continue turning before sitting down.  Ambulation/Gait Ambulation/Gait assistance: Min guard Ambulation Distance (Feet): 200 Feet Assistive device: Rolling walker (2  wheeled) Gait Pattern/deviations: Step-through pattern;Decreased stride length;Trunk flexed Gait velocity: decreased   General Gait Details: Pt ambulated further distance on 4L O2 with SpO2 95%. VCs and visual cues for obstacle negotiation and posture while ambulating. Pt's HR 73 bpm. Noticeable DOE; VCs for pacing.   Stairs            Wheelchair Mobility    Modified Rankin (Stroke Patients Only)       Balance Overall balance assessment: Needs assistance Sitting-balance support: No upper extremity supported;Feet supported Sitting balance-Leahy Scale: Good     Standing balance support: Bilateral upper extremity supported Standing balance-Leahy Scale: Poor                              Cognition Arousal/Alertness: Awake/alert Behavior During Therapy: WFL for tasks assessed/performed Overall Cognitive Status: Within Functional Limits for tasks assessed                                        Exercises      General Comments        Pertinent Vitals/Pain Pain Assessment: No/denies pain    Home Living                      Prior Function            PT Goals (current goals can now be found in the care plan  section) Acute Rehab PT Goals Patient Stated Goal: To return home PT Goal Formulation: With patient Time For Goal Achievement: 03/28/17 Potential to Achieve Goals: Good Progress towards PT goals: Progressing toward goals    Frequency    Min 3X/week      PT Plan Current plan remains appropriate    Co-evaluation              AM-PAC PT "6 Clicks" Daily Activity  Outcome Measure  Difficulty turning over in bed (including adjusting bedclothes, sheets and blankets)?: None Difficulty moving from lying on back to sitting on the side of the bed? : Unable Difficulty sitting down on and standing up from a chair with arms (e.g., wheelchair, bedside commode, etc,.)?: Unable Help needed moving to and from a bed to  chair (including a wheelchair)?: A Little Help needed walking in hospital room?: A Little Help needed climbing 3-5 steps with a railing? : A Lot 6 Click Score: 14    End of Session Equipment Utilized During Treatment: Gait belt;Oxygen Activity Tolerance: Patient tolerated treatment well Patient left: in bed;with call bell/phone within reach;with nursing/sitter in room;with family/visitor present Nurse Communication: Mobility status PT Visit Diagnosis: Unsteadiness on feet (R26.81);Muscle weakness (generalized) (M62.81)     Time: [id number] PT Time Calculation (min) (ACUTE ONLY): 25 min  Charges:  $Gait Training: 8-22 mins $Therapeutic Activity: 8-22 mins                    G Codes:       Sharlene MottsRachel Marcin Holte, SPTA   Sharlene MottsRachel Afua Hoots 03/27/2017, 12:10 PM

## 2017-03-27 NOTE — Progress Notes (Signed)
Inpatient Diabetes Program Recommendations  AACE/ADA: New Consensus Statement on Inpatient Glycemic Control (2015)  Target Ranges:  Prepandial:   less than 140 mg/dL      Peak postprandial:   less than 180 mg/dL (1-2 hours)      Critically ill patients:  140 - 180 mg/dL   Results for Alexis [name], Alexis [name] (MRN [id number]) as of 03/27/2017 08:25  Ref. Range 03/26/2017 07:44 03/26/2017 11:51 03/26/2017 16:44 03/26/2017 21:56  Glucose-Capillary Latest Ref Range: 65 - 99 mg/dL 89 403 (H) 474 (H) 259 (H)   Results for [name], [name] (MRN [id number]) as of 03/27/2017 08:25  Ref. Range 03/27/2017 07:38  Glucose-Capillary Latest Ref Range: 65 - 99 mg/dL 80    Home DM Meds: None  Current Insulin Orders: Levemir 10 units daily                                       Novolog Moderate Correction Scale/ SSI (0-15 units) TID AC + HS                                       Novolog 5 units TID with meals       MD- Please consider the following in-hospital insulin adjustments:  1. Decrease Levemir to 5 units daily  2. Patient may need low dose oral diabetes medication at time of discharge??  Current A1c is 8.8%.  Stopped Glipizide per patient report.  Not sure when patient stopped Glipizide at home?  Per haps patient would do OK with a DPP-4 inhibitor at home like Tradjenta 5 mg daily.  Jearld Lesch does not need to be renally dosed and has low risk of Hypoglycemia due to it functioning in a glucose dependent manner.      --Will follow patient during hospitalization--  Ambrose Finland RN, MSN, CDE Diabetes Coordinator Inpatient Glycemic Control Team Team Pager: [phone] (8a-5p)

## 2017-03-27 NOTE — Progress Notes (Signed)
PROGRESS NOTE  Alexis [name] WGN:[id number] DOB: 1932/04/20 DOA: 03/20/2017 PCP: Ladora Silvestre [place]ines, PA-C  Brief Narrative: 83 year old woman PMH diastolic heart failure, oxygen dependent COPD presented with increasing shortness of breath, orthopnea and worsening hypoxia.  Admitted for acute on chronic diastolic congestive heart failure, acute on chronic hypoxic respiratory failure.  Assessment/Plan AKI superimposed on CKD stage IV - somewhat worse today. Baseline about 2 - appears euvolemic on exam. Has some rales but wonder if this is scarring. Not short of breath. Will trial IVF for 8 hours and recheck in AM. - check U/A, renal u/s.  Acute on chronic diastolic congestive heart failure - appears compensated. Continue metoprolol. Hold torsemide.  Acute on chronic hypoxic respiratory failure - appears to be at baseline  E coli UTI -treated with abx  COPD - appears stable  Anemia of chronic kidney disease - hemoglobin remains stable  Atrial fibrillation, CHA2DS2-VASc 6. -warfarin per pharmacy   Diabetes mellitus type 2. Hgb A1c 8.8. -decrease Levemir  Severe pulmonary HTN -presumably from second-hand smoke exposure.  DVT prophylaxis: warfarin Code Status: DNR Family Communication: son at bedside Disposition Plan: HH PT   Brendia Sacks, MD  [facility] Direct contact: [phone] --Via amion app OR  --[email or web address]; password [email or web address]  7PM-7AM contact night coverage as above 03/27/2017, 10:17 AM  LOS: 7 days   Consultants:  Cardiology   Procedures:  Echo Study Conclusions  - Left ventricle: The cavity size was normal. Wall thickness was   normal. Systolic function was vigorous. The estimated ejection   fraction was in the range of 65% to 70%. Wall motion was normal;   there were no regional wall motion abnormalities. Features are   consistent with a pseudonormal left ventricular filling pattern,   with concomitant abnormal relaxation and increased  filling   pressure (grade 2 diastolic dysfunction). - Aortic valve: Trileaflet; moderately thickened, moderately   calcified leaflets. There was mild stenosis. Peak velocity (S):   276 cm/s. Mean gradient (S): 15 mm Hg. Valve area (VTI): 1.69   cm^2. Valve area (Vmax): 1.76 cm^2. Valve area (Vmean): 1.64   cm^2. - Mitral valve: Moderately calcified annulus. Mildly thickened   leaflets . There was moderate regurgitation. - Left atrium: The atrium was moderately to severely dilated.   Volume/bsa, S: 49.5 ml/m^2. - Right atrium: The atrium was moderately dilated. - Tricuspid valve: There was moderate regurgitation. - Pulmonary arteries: Systolic pressure was severely increased. PA   peak pressure: 70 mm Hg (S).  Impressions:  - Compared to the prior study, there has been no significant   interval change.  Antimicrobials:  Ceftriaxone 10/25 >> 10/28  Interval history/Subjective: Feels fine, no pain. Breathing fine. No n/v.  Objective: Vitals:   Vitals:   03/27/17 0546 03/27/17 0800  BP: (!) 155/43 (!) 147/57  Pulse: 65 67  Resp: 18   Temp: 98 F (36.7 C)   SpO2: 96% 100%    Exam: Constitutional:  . Appears calm and comfortable Eyes:  . pupils and irises appear normal . Normal lids   ENMT:  . Mildly hard of hearing  . Lips appear normal Respiratory:  . Bilateral inspiratory crackle, few rhonchi . Respiratory effort normal.   Cardiovascular:  . RRR, no r/g. 2/6 systolic murmur. . No LE extremity edema   Musculoskeletal:  . Digits/nails BUE: no clubbing, cyanosis, petechiae, infection . RUE, LUE, RLE, LLE   o strength and tone normal, no atrophy, no abnormal movements Skin:  . No rashes, lesions, ulcers Psychiatric:  .  Mental status o Mood, affect appropriate   I have personally reviewed the following:  UOP incompletely recorded Labs:  CBG stable  K+ WNL  BUN stable at 60  Creatinine 2.99 >> 3.28  Hgb stable 7.8  INR 2.16  Imaging  studies:     Scheduled Meds: . atorvastatin  20 mg Oral Daily  . cholecalciferol  1,000 Units Oral q morning - 10a  . escitalopram  10 mg Oral Daily  . feeding supplement (ENSURE ENLIVE)  237 mL Oral BID BM  . fenofibrate  160 mg Oral Daily  . gabapentin  100 mg Oral QHS  . guaiFENesin  600 mg Oral BID  . hydrALAZINE  75 mg Oral BID  . insulin aspart  0-15 Units Subcutaneous TID WC  . insulin aspart  0-5 Units Subcutaneous QHS  . insulin aspart  5 Units Subcutaneous TID WC  . insulin detemir  10 Units Subcutaneous Daily  . iron polysaccharides  150 mg Oral Daily  . isosorbide mononitrate  30 mg Oral Daily  . methimazole  5 mg Oral Daily  . metoprolol tartrate  25 mg Oral BID  . multivitamin with minerals  1 tablet Oral Daily  . ondansetron  4 mg Oral Q6H  . polyethylene glycol  17 g Oral Daily  . sodium chloride flush  3 mL Intravenous Q12H  . torsemide  40 mg Oral Daily  . warfarin  4 mg Oral q1800  . Warfarin - Pharmacist Dosing Inpatient   Does not apply q1800   Continuous Infusions: . sodium chloride      Active Problems:   COPD (chronic obstructive pulmonary disease) (HCC)   Paroxysmal atrial fibrillation (HCC)   Long term current use of anticoagulant therapy   Chronic respiratory failure with hypoxia (HCC)   Type II diabetes mellitus (HCC)   AKI (acute kidney injury) (HCC)   CKD (chronic kidney disease), stage IV (HCC)   Acute on chronic diastolic CHF (congestive heart failure) (HCC)   Severe pulmonary arterial systolic hypertension (HCC)   LOS: 7 days

## 2017-03-28 ENCOUNTER — Inpatient Hospital Stay (HOSPITAL_COMMUNITY): Payer: Medicare Other

## 2017-03-28 ENCOUNTER — Encounter (HOSPITAL_COMMUNITY): Payer: Self-pay | Admitting: General Practice

## 2017-03-28 LAB — URINALYSIS, ROUTINE W REFLEX MICROSCOPIC
BILIRUBIN URINE: NEGATIVE
Glucose, UA: NEGATIVE mg/dL
Hgb urine dipstick: NEGATIVE
Ketones, ur: NEGATIVE mg/dL
NITRITE: NEGATIVE
PH: 5 (ref 5.0–8.0)
Protein, ur: 100 mg/dL — AB
SPECIFIC GRAVITY, URINE: 1.009 (ref 1.005–1.030)

## 2017-03-28 LAB — GLUCOSE, CAPILLARY
GLUCOSE-CAPILLARY: 72 mg/dL (ref 65–99)
GLUCOSE-CAPILLARY: 79 mg/dL (ref 65–99)
Glucose-Capillary: 219 mg/dL — ABNORMAL HIGH (ref 65–99)
Glucose-Capillary: 143 mg/dL — ABNORMAL HIGH (ref 65–99)

## 2017-03-28 LAB — BASIC METABOLIC PANEL
Anion gap: 8 (ref 5–15)
BUN: 59 mg/dL — AB (ref 6–20)
CO2: 26 mmol/L (ref 22–32)
CREATININE: 3.07 mg/dL — AB (ref 0.44–1.00)
Calcium: 8.2 mg/dL — ABNORMAL LOW (ref 8.9–10.3)
Chloride: 105 mmol/L (ref 101–111)
GFR, EST AFRICAN AMERICAN: 15 mL/min — AB (ref >60)
GFR, EST NON AFRICAN AMERICAN: 13 mL/min — AB (ref >60)
Glucose, Bld: 218 mg/dL — ABNORMAL HIGH (ref 65–99)
Potassium: 4.6 mmol/L (ref 3.5–5.1)
Sodium: 139 mmol/L (ref 135–145)

## 2017-03-28 LAB — PROTIME-INR
INR: 2.71
Prothrombin Time: 28.5 seconds — ABNORMAL HIGH (ref 11.4–15.2)

## 2017-03-28 MED ORDER — TORSEMIDE 20 MG PO TABS
40.0000 mg | ORAL_TABLET | Freq: Every day | ORAL | Status: DC
  Administered 2017-03-29: 40 mg via ORAL
  Filled 2017-03-28: qty 2

## 2017-03-28 MED ORDER — WARFARIN SODIUM 2 MG PO TABS
2.0000 mg | ORAL_TABLET | Freq: Once | ORAL | Status: AC
  Administered 2017-03-28: 2 mg via ORAL
  Filled 2017-03-28: qty 1

## 2017-03-28 MED ORDER — SODIUM CHLORIDE 0.9 % IV SOLN
INTRAVENOUS | Status: AC
  Administered 2017-03-28: 17:00:00 via INTRAVENOUS

## 2017-03-28 NOTE — Progress Notes (Signed)
ANTICOAGULATION CONSULT NOTE   Pharmacy Consult for Warfarin Indication: atrial fibrillation  Allergies  Allergen Reactions  . Yellow Dyes (Non-Tartrazine) Nausea And Vomiting    "deathly allergic" No other information given to explain reaction. Per Daughter- this was an IV dye. Ok with yellow coloring on Coumadin tablets.   . Codeine Hives  . Other Other (See Comments)    novocaine broke mouth out  . Penicillins Hives  . Procaine Hives    Labs:  Recent Labs  03/26/17 0429 03/27/17 0537 03/28/17 0337  HGB 7.4* 7.8*  --   HCT 24.7* 26.0*  --   PLT 223 221  --   LABPROT 21.1* 23.9* 28.5*  INR 1.84 2.16 2.71  CREATININE 2.99* 3.28*  --     Estimated Creatinine Clearance: 12.1 mL/min (A) (by C-G formula based on SCr of 3.28 mg/dL (H)).   Assessment: 69 YOF on warfarin PTA for AFib- Home dose (as per Coumadin Clinic note on 10/12) was 4mg  daily except 6mg  on Mondays- dose was reduced to that regimen during that appointment d/t INR of 3.6 in the clinic.  Coumadin last taken on 10/24 PTA,  Held dose since admission on 03/20/17 INR = 2.71 today (quick trend up)  Goal of Therapy:  INR 2-3 Monitor platelets by anticoagulation protocol: Yes   Plan:  Warfarin 2 mg po x 1 today Daily INR and CBC Follow s/s bleeding  Thank you Okey Regal, PharmD [phone] 03/28/2017 8:13 AM

## 2017-03-28 NOTE — Progress Notes (Signed)
PROGRESS NOTE  Alexis GivensChristine E [name] ZHY:[id number] DOB: 10/23/1931 DOA: 03/20/2017 PCP: Ladora DanielBeal, [name], PA-C  Brief Narrative: 83 year old woman PMH diastolic heart failure, oxygen dependent COPD presented with increasing shortness of breath, orthopnea and worsening hypoxia.  Admitted for acute on chronic diastolic congestive heart failure, acute on chronic hypoxic respiratory failure.  Assessment/Plan AKI superimposed on CKD stage IV - better today. Renal u/s showed medical renal disease. U/A showed asymptomatic bacturia -repeat IVF for 8 hours. Repeat BMP in AM.  Acute on chronic diastolic congestive heart failure - stable. No volume overload noted. Continue metoprolol. Hold torsemide today.  Acute on chronic hypoxic respiratory failure - stable  E coli UTI -treated with abx  COPD - stable  Anemia of chronic kidney disease - stable  Atrial fibrillation, CHA2DS2-VASc 6. -warfarin per pharmacy   Diabetes mellitus type 2. Hgb A1c 8.8. -stable, continue Levemir  Severe pulmonary HTN -presumably from second-hand smoke exposure.  If renal function stable/better tomorrow >> home  DVT prophylaxis: warfarin Code Status: DNR Family Communication: daughter at bedside Disposition Plan: HH PT   Brendia Sacksaniel Goodrich, MD  [facility] Direct contact: [phone] --Via amion app OR  --[email or web address]; password [email or web address]  7PM-7AM contact night coverage as above 03/28/2017, 4:24 PM  LOS: 8 days   Consultants:  Cardiology   Procedures:  Echo Study Conclusions  - Left ventricle: The cavity size was normal. Wall thickness was   normal. Systolic function was vigorous. The estimated ejection   fraction was in the range of 65% to 70%. Wall motion was normal;   there were no regional wall motion abnormalities. Features are   consistent with a pseudonormal left ventricular filling pattern,   with concomitant abnormal relaxation and increased filling   pressure (grade 2 diastolic  dysfunction). - Aortic valve: Trileaflet; moderately thickened, moderately   calcified leaflets. There was mild stenosis. Peak velocity (S):   276 cm/s. Mean gradient (S): 15 mm Hg. Valve area (VTI): 1.69   cm^2. Valve area (Vmax): 1.76 cm^2. Valve area (Vmean): 1.64   cm^2. - Mitral valve: Moderately calcified annulus. Mildly thickened   leaflets . There was moderate regurgitation. - Left atrium: The atrium was moderately to severely dilated.   Volume/bsa, S: 49.5 ml/m^2. - Right atrium: The atrium was moderately dilated. - Tricuspid valve: There was moderate regurgitation. - Pulmonary arteries: Systolic pressure was severely increased. PA   peak pressure: 70 mm Hg (S).  Impressions:  - Compared to the prior study, there has been no significant   interval change.  Antimicrobials:  Ceftriaxone 10/25 >> 10/28  Interval history/Subjective: No complaints. Feels ok.  Objective: Vitals:   Vitals:   03/28/17 1153 03/28/17 1200  BP: (!) 128/38 (!) 136/50  Pulse: 66 73  Resp: 20   Temp: 98.5 F (36.9 C)   SpO2: 97% 96%    Exam: Constitutional:  . Appears calm and comfortable Respiratory:  . CTA bilaterally, no w/r/r.  . Respiratory effort normal.  Cardiovascular:  . RRR, no m/r/g . No LE extremity edema   Psychiatric:  . Mental status o Mood, affect appropriate  I have personally reviewed the following:   UOP incompletely recorded again today  Labs:  CBG remains stable  Potassium WNL  BUN 60 >> 59  Creatinine 2.99 >> 3.28 >> 3.07  INR 2.71  Imaging studies:     Scheduled Meds: . atorvastatin  20 mg Oral Daily  . cholecalciferol  1,000 Units Oral q morning - 10a  . escitalopram  10 mg  Oral Daily  . feeding supplement (ENSURE ENLIVE)  237 mL Oral BID BM  . fenofibrate  160 mg Oral Daily  . gabapentin  100 mg Oral QHS  . guaiFENesin  600 mg Oral BID  . hydrALAZINE  75 mg Oral BID  . insulin aspart  0-15 Units Subcutaneous TID WC  . insulin  aspart  0-5 Units Subcutaneous QHS  . insulin aspart  5 Units Subcutaneous TID WC  . insulin detemir  5 Units Subcutaneous Daily  . iron polysaccharides  150 mg Oral Daily  . isosorbide mononitrate  30 mg Oral Daily  . methimazole  5 mg Oral Daily  . metoprolol tartrate  25 mg Oral BID  . multivitamin with minerals  1 tablet Oral Daily  . ondansetron  4 mg Oral Q6H  . polyethylene glycol  17 g Oral Daily  . sodium chloride flush  3 mL Intravenous Q12H  . [START ON 03/29/2017] torsemide  40 mg Oral Daily  . warfarin  2 mg Oral ONCE-1800  . Warfarin - Pharmacist Dosing Inpatient   Does not apply q1800   Continuous Infusions: . sodium chloride      Active Problems:   COPD (chronic obstructive pulmonary disease) (HCC)   Paroxysmal atrial fibrillation (HCC)   Long term current use of anticoagulant therapy   Chronic respiratory failure with hypoxia (HCC)   Type II diabetes mellitus (HCC)   AKI (acute kidney injury) (HCC)   CKD (chronic kidney disease), stage IV (HCC)   Acute on chronic diastolic CHF (congestive heart failure) (HCC)   Severe pulmonary arterial systolic hypertension (HCC)   LOS: 8 days

## 2017-03-28 NOTE — Progress Notes (Signed)
Progress Note  Patient Name: Alexis [name] Date of Encounter: 03/28/2017  Primary Cardiologist: Hilty   Subjective   83 year old female with a history of chronic diastolic congestive heart failure, paroxysmal atrial fibrillation, pulmonary hypertension.  She has COPD.  Creatinine today is 3.07.  Inpatient Medications    Scheduled Meds: . atorvastatin  20 mg Oral Daily  . cholecalciferol  1,000 Units Oral q morning - 10a  . escitalopram  10 mg Oral Daily  . feeding supplement (ENSURE ENLIVE)  237 mL Oral BID BM  . fenofibrate  160 mg Oral Daily  . gabapentin  100 mg Oral QHS  . guaiFENesin  600 mg Oral BID  . hydrALAZINE  75 mg Oral BID  . insulin aspart  0-15 Units Subcutaneous TID WC  . insulin aspart  0-5 Units Subcutaneous QHS  . insulin aspart  5 Units Subcutaneous TID WC  . insulin detemir  5 Units Subcutaneous Daily  . iron polysaccharides  150 mg Oral Daily  . isosorbide mononitrate  30 mg Oral Daily  . methimazole  5 mg Oral Daily  . metoprolol tartrate  25 mg Oral BID  . multivitamin with minerals  1 tablet Oral Daily  . ondansetron  4 mg Oral Q6H  . polyethylene glycol  17 g Oral Daily  . sodium chloride flush  3 mL Intravenous Q12H  . warfarin  2 mg Oral ONCE-1800  . Warfarin - Pharmacist Dosing Inpatient   Does not apply q1800   Continuous Infusions: . sodium chloride     PRN Meds: sodium chloride, acetaminophen **OR** acetaminophen, albuterol, ondansetron **OR** ondansetron (ZOFRAN) IV, sodium chloride flush, traZODone   Vital Signs    Vitals:   03/28/17 0517 03/28/17 0916 03/28/17 1153 03/28/17 1200  BP: (!) 148/48 (!) 134/44 (!) 128/38 (!) 136/50  Pulse: 66  66 73  Resp: 18  20   Temp: 97.9 F (36.6 C)  98.5 F (36.9 C)   TempSrc: Oral  Oral   SpO2: 99%  97% 96%  Weight: 158 lb 4.8 oz (71.8 kg)     Height:        Intake/Output Summary (Last 24 hours) at 03/28/17 1224 Last data filed at 03/28/17 1013  Gross per 24 hour  Intake              1060 ml  Output              210 ml  Net              850 ml   Filed Weights   03/26/17 0529 03/27/17 0546 03/28/17 0517  Weight: 155 lb 3.2 oz (70.4 kg) 156 lb 6.4 oz (70.9 kg) 158 lb 4.8 oz (71.8 kg)    Telemetry    NSR  - Personally Reviewed  ECG     NSR  - Personally Reviewed  Physical Exam   GEN: No acute distress.  Elderly female.  She has no complaints. Neck: No JVD Cardiac: RR, no murmurs, rubs, or gallops.  Respiratory: Clear to auscultation bilaterally. GI: Soft, nontender, non-distended  MS: No edema; No deformity. Neuro:  Nonfocal  Psych: Normal affect   Labs    Chemistry Recent Labs Lab 03/26/17 0429 03/27/17 0537 03/28/17 1056  NA 139 140 139  K 5.1 5.0 4.6  CL 104 104 105  CO2 29 29 26   GLUCOSE 110* 78 218*  BUN 60* 60* 59*  CREATININE 2.99* 3.28* 3.07*  CALCIUM 7.8* 8.0* 8.2*  GFRNONAA 13* 12* 13*  GFRAA 16* 14* 15*  ANIONGAP 6 7 8      Hematology Recent Labs Lab 03/25/17 0545 03/26/17 0429 03/27/17 0537  WBC 6.4 6.7 8.3  RBC 2.87* 2.75* 2.89*  HGB 7.8* 7.4* 7.8*  HCT 25.7* 24.7* 26.0*  MCV 89.5 89.8 90.0  MCH 27.2 26.9 27.0  MCHC 30.4 30.0 30.0  RDW 15.6* 15.9* 16.1*  PLT 231 223 221    Cardiac EnzymesNo results for input(s): TROPONINI in the last 168 hours. No results for input(s): TROPIPOC in the last 168 hours.   BNPNo results for input(s): BNP, PROBNP in the last 168 hours.   DDimer No results for input(s): DDIMER in the last 168 hours.   Radiology    [place] Renal  Result Date: 03/28/2017 CLINICAL DATA:  Acute renal failure. EXAM: RENAL / URINARY TRACT ULTRASOUND COMPLETE COMPARISON:  CT 09/21/2015 . FINDINGS: Right Kidney: Length: 10.4 cm. Mild increased echogenicity. Cortical thinning and irregularity. 8 mm nonobstructing calyceal stone. No mass or hydronephrosis visualized. Left Kidney: Length: 11.3 cm. Mild increased echogenicity. Cortical thinning and irregularity. No mass or hydronephrosis visualized. Bladder:  Appears normal for degree of bladder distention. Incidental note made of gallstones. IMPRESSION: 1. Mild increased echogenicity both kidneys. Bilateral renal cortical thinning and irregularity. Findings consistent with chronic medical renal disease, cortical atrophy and scarring. 2. Nonobstructing 8 mm right renal stone. 3. No acute abnormalities. No evidence of hydronephrosis or bladder distention. 4. Incidental note made of gallstones . Electronically Signed   By: Maisie Fushomas  Register   On: 03/28/2017 07:23    Cardiac Studies     Patient Profile     83 y.o. female acute on chronic diastolic congestive heart failure.  Assessment & Plan    1.  Acute on chronic diastolic congestive heart failure: He is breathing better.  She is not diuresed and in fact her net ins and outs are +700 cc for the admission.   Suspect some of her shortness of breath on admission was due to her COPD and pulmonary hypertension.  She should be restarted on her home dose of torsemide soon,   Will start tomorrow   2.  Acute on chronic kidney disease: Further plans per internal medicine.  3.  Paroxysmal atrial for ablation.  Continue Coumadin.  Her heart rate is well controlled.   For questions or updates, please contact [facility] Please consult [email or web address] for contact info under Cardiology/STEMI.      Signed, Kristeen MissPhilip Aydrien Froman, MD  03/28/2017, 12:24 PM

## 2017-03-29 ENCOUNTER — Inpatient Hospital Stay (HOSPITAL_COMMUNITY): Payer: Medicare Other

## 2017-03-29 DIAGNOSIS — J9621 Acute and chronic respiratory failure with hypoxia: Secondary | ICD-10-CM

## 2017-03-29 LAB — BLOOD GAS, ARTERIAL
Acid-Base Excess: 2.9 mmol/L — ABNORMAL HIGH (ref 0.0–2.0)
BICARBONATE: 27.9 mmol/L (ref 20.0–28.0)
DRAWN BY: 275531
O2 Content: 3.5 L/min
O2 Saturation: 95.9 %
PH ART: 7.358 (ref 7.350–7.450)
Patient temperature: 98.6
pCO2 arterial: 50.9 mmHg — ABNORMAL HIGH (ref 32.0–48.0)
pO2, Arterial: 77.9 mmHg — ABNORMAL LOW (ref 83.0–108.0)

## 2017-03-29 LAB — PROTIME-INR
INR: 3.5
PROTHROMBIN TIME: 34.9 s — AB (ref 11.4–15.2)

## 2017-03-29 LAB — GLUCOSE, CAPILLARY
GLUCOSE-CAPILLARY: 149 mg/dL — AB (ref 65–99)
Glucose-Capillary: 151 mg/dL — ABNORMAL HIGH (ref 65–99)
Glucose-Capillary: 241 mg/dL — ABNORMAL HIGH (ref 65–99)
Glucose-Capillary: 149 mg/dL — ABNORMAL HIGH (ref 65–99)
Glucose-Capillary: 277 mg/dL — ABNORMAL HIGH (ref 65–99)

## 2017-03-29 LAB — BASIC METABOLIC PANEL
ANION GAP: 9 (ref 5–15)
BUN: 56 mg/dL — ABNORMAL HIGH (ref 6–20)
CALCIUM: 8.1 mg/dL — AB (ref 8.9–10.3)
CO2: 23 mmol/L (ref 22–32)
CREATININE: 2.86 mg/dL — AB (ref 0.44–1.00)
Chloride: 106 mmol/L (ref 101–111)
GFR, EST AFRICAN AMERICAN: 16 mL/min — AB (ref >60)
GFR, EST NON AFRICAN AMERICAN: 14 mL/min — AB (ref >60)
GLUCOSE: 289 mg/dL — AB (ref 65–99)
Potassium: 4.3 mmol/L (ref 3.5–5.1)
Sodium: 138 mmol/L (ref 135–145)

## 2017-03-29 LAB — LACTIC ACID, PLASMA: Lactic Acid, Venous: 1.2 mmol/L (ref 0.5–1.9)

## 2017-03-29 MED ORDER — AZTREONAM 1 G IJ SOLR
1.0000 g | Freq: Three times a day (TID) | INTRAMUSCULAR | Status: DC
  Filled 2017-03-29: qty 1

## 2017-03-29 MED ORDER — INSULIN ASPART 100 UNIT/ML ~~LOC~~ SOLN
0.0000 [IU] | Freq: Four times a day (QID) | SUBCUTANEOUS | Status: DC
  Administered 2017-03-30: 8 [IU] via SUBCUTANEOUS
  Administered 2017-03-30 – 2017-03-31 (×5): 5 [IU] via SUBCUTANEOUS
  Administered 2017-03-31: 11 [IU] via SUBCUTANEOUS

## 2017-03-29 MED ORDER — FUROSEMIDE 10 MG/ML IJ SOLN
40.0000 mg | Freq: Once | INTRAMUSCULAR | Status: AC
  Administered 2017-03-29: 40 mg via INTRAVENOUS
  Filled 2017-03-29: qty 4

## 2017-03-29 MED ORDER — IPRATROPIUM-ALBUTEROL 0.5-2.5 (3) MG/3ML IN SOLN
3.0000 mL | Freq: Three times a day (TID) | RESPIRATORY_TRACT | Status: DC
  Administered 2017-03-29 – 2017-04-08 (×29): 3 mL via RESPIRATORY_TRACT
  Filled 2017-03-29 (×29): qty 3

## 2017-03-29 MED ORDER — METHYLPREDNISOLONE SODIUM SUCC 125 MG IJ SOLR
60.0000 mg | Freq: Four times a day (QID) | INTRAMUSCULAR | Status: DC
  Administered 2017-03-29 – 2017-04-01 (×11): 60 mg via INTRAVENOUS
  Filled 2017-03-29 (×11): qty 2

## 2017-03-29 MED ORDER — IPRATROPIUM-ALBUTEROL 0.5-2.5 (3) MG/3ML IN SOLN
3.0000 mL | RESPIRATORY_TRACT | Status: DC
  Administered 2017-03-29: 3 mL via RESPIRATORY_TRACT
  Filled 2017-03-29: qty 3

## 2017-03-29 MED ORDER — FUROSEMIDE 10 MG/ML IJ SOLN
INTRAMUSCULAR | Status: AC
Start: 2017-03-29 — End: 2017-03-29
  Administered 2017-03-29: 20 mg
  Filled 2017-03-29: qty 2

## 2017-03-29 MED ORDER — VANCOMYCIN HCL 10 G IV SOLR
1350.0000 mg | Freq: Once | INTRAVENOUS | Status: AC
  Administered 2017-03-29: 1350 mg via INTRAVENOUS
  Filled 2017-03-29: qty 1350

## 2017-03-29 MED ORDER — VANCOMYCIN HCL IN DEXTROSE 1-5 GM/200ML-% IV SOLN: 1000.0000 mg | INTRAVENOUS | Status: DC

## 2017-03-29 MED ORDER — DEXTROSE 5 % IV SOLN
1.0000 g | INTRAVENOUS | Status: AC
  Administered 2017-03-29 – 2017-04-05 (×8): 1 g via INTRAVENOUS
  Filled 2017-03-29 (×8): qty 1

## 2017-03-29 NOTE — Progress Notes (Signed)
Pharmacy Antibiotic Note  Alexis [name] is a 83 y.o. female admitted on 03/20/2017 with CHF/COPD exacerbation.  Pharmacy has been consulted for vancomycin dosing for pneumonia  Patient has worsened SOB requiring increased O2 requirements. No rales on exam. Placed on BiPaP.CXR suggestive of PNA. Pt is afebrile, LA ordered, blood cultures pending. No CBC drawn.  Patient has CKD 4, Scr 2.86, CrCl ~10-15 ml/min  Plan: Vancomycin 1,350 mg gtt bolus x 1 Vancomycin 1,000 mg gtt Q48h Cefepime 1g gtt Q24h per MD dosing Monitor renal function closely Follow-up on cultures, LOT, de-escaltion  Height: 5\' 2"  (157.5 cm) Weight: 165 lb 5.5 oz (75 kg) IBW/kg (Calculated) : 50.1  Temp (24hrs), Avg:98 F (36.7 C), Min:97.7 F (36.5 C), Max:98.5 F (36.9 C)   Recent Labs Lab 03/23/17 0404 03/24/17 0508 03/25/17 0545 03/26/17 0429 03/27/17 0537 03/28/17 1056 03/29/17 1021  WBC 8.7 8.7 6.4 6.7 8.3  --   --   CREATININE 3.26* 2.96* 2.75* 2.99* 3.28* 3.07* 2.86*    Estimated Creatinine Clearance: 14.1 mL/min (A) (by C-G formula based on SCr of 2.86 mg/dL (H)).    Allergies  Allergen Reactions  . Yellow Dyes (Non-Tartrazine) Nausea And Vomiting    "deathly allergic" No other information given to explain reaction. Per Daughter- this was an IV dye. Ok with yellow coloring on Coumadin tablets.   . Codeine Hives  . Other Other (See Comments)    novocaine broke mouth out  . Penicillins Hives  . Procaine Hives    Antimicrobials this admission: 10/25 Ceftriaxone >> 10/26 10/27 Keflex >>10/28 11/3 Vanc>> 11/3 Cefepime>>   Microbiology results: 11/3 BCx:  10/25 UCx: E. Coli 11/3 Sputum:  [name], PharmD Pharmacy Resident Pager: [phone]

## 2017-03-29 NOTE — Progress Notes (Signed)
Patient's family called RN into room concerned about the patient's breathing.  Patient had audible wheezes bilaterally with diminished lung sounds.  PRN albuterol treatment given with no relief.  Patient oxygen saturation 95% on 3.5L oxygen.  Patient arousable but goes back to sleep quickly. RR 30 with increased work of breathing.  Dr. Irene Limbo paged and new orders received.  Will continue to monitor patient.

## 2017-03-29 NOTE — Progress Notes (Signed)
Took pt off of Bipap per pt request.  She has been on it for about 2 hours.  She states her breathing is better now.  Placed on 3.5L O2 Nasal Canula. O2 Sats 92-96%.  Pt in no distress, asking for food.  Family at bedside.

## 2017-03-29 NOTE — Progress Notes (Signed)
Patient started wheezing with SOB @ approx 0215. Albuterol Given @ 0222 with minimal effect.Md called and Lasix 40mg  IV  given @ 0355 with effect.Later urinated 425cc by [phone] and stated feeling better.

## 2017-03-29 NOTE — Progress Notes (Signed)
On arrival patient appears SOB, pt states that she feels a little short of breath. Pt want to use the BiPAP for the night. Pt is on it now tolerating it well

## 2017-03-29 NOTE — Progress Notes (Signed)
ANTICOAGULATION CONSULT NOTE   Pharmacy Consult for Warfarin Indication: atrial fibrillation  Allergies  Allergen Reactions  . Yellow Dyes (Non-Tartrazine) Nausea And Vomiting    "deathly allergic" No other information given to explain reaction. Per Daughter- this was an IV dye. Ok with yellow coloring on Coumadin tablets.   . Codeine Hives  . Other Other (See Comments)    novocaine broke mouth out  . Penicillins Hives  . Procaine Hives    Labs:  Recent Labs  03/27/17 0537 03/28/17 0337 03/28/17 1056 03/29/17 0521 03/29/17 1021  HGB 7.8*  --   --   --   --   HCT 26.0*  --   --   --   --   PLT 221  --   --   --   --   LABPROT 23.9* 28.5*  --  34.9*  --   INR 2.16 2.71  --  3.50  --   CREATININE 3.28*  --  3.07*  --  2.86*    Estimated Creatinine Clearance: 14.1 mL/min (A) (by C-G formula based on SCr of 2.86 mg/dL (H)).   Assessment: 32 YOF on warfarin PTA for AFib- Home dose (as per Coumadin Clinic note on 10/12) was 4mg  daily except 6mg  on Mondays- dose was reduced to that regimen during that appointment d/t INR of 3.6 in the clinic.  INR up to 3.5 (very rapid increase). Patient on concomitant therapies that effect INR levels - fenofibrate (increases INR), and methimazole (decreases INR).   Goal of Therapy:  INR 2-3 Monitor platelets by anticoagulation protocol: Yes   Plan:  Hold Warfarin dose tonight Daily INR and CBC Follow s/s bleeding  [name], PharmD Pharmacy Resident Pager: [phone]

## 2017-03-29 NOTE — Progress Notes (Signed)
PROGRESS NOTE  Alexis [name] GLO:[id number] DOB: 10/18/1931 DOA: 03/20/2017 PCP: Ladora Lea Walbert, PA-C  Brief Narrative: 83 year old woman PMH diastolic heart failure, oxygen dependent COPD presented with increasing shortness of breath, orthopnea and worsening hypoxia.  Admitted for acute on chronic diastolic congestive heart failure, acute on chronic hypoxic respiratory failure.  Assessment/Plan Acute on chronic hypoxic respiratory failure, COPD - worse today with increased resp effort, prolonged expiration, increased oxygen requirement to 3.5 L -no rales on exam, doubt volume overload -check CXR, Lasix x1 but suspect COPD exacerbation given exam. Start steroids, duonebs, check STAT CXR, check ABG  Acute on chronic diastolic congestive heart failure - no rales on exam and no LE edema. Appears compensated but will check CXR.  AKI superimposed on CKD stage IV. Renal u/s showed medical renal disease. U/A showed asymptomatic bacturia - creatinine better today with IVF yesterday. - BMP in AM  E coli UTI -treated with abx  Anemia of chronic kidney disease - stable  Atrial fibrillation, CHA2DS2-VASc 6. -warfarin per pharmacy. INR higher today.  Diabetes mellitus type 2. Hgb A1c 8.8. -remains stable, continue Levemir  Severe pulmonary HTN -presumably from second-hand smoke exposure.  DVT prophylaxis: warfarin Code Status: DNR Family Communication: daughter and son at bedside 11/3 Disposition Plan: HH PT   Brendia Sacks, MD  [facility] Direct contact: [phone] --Via amion app OR  --[email or web address]; password [email or web address]  7PM-7AM contact night coverage as above 03/29/2017, 12:44 PM  LOS: 9 days   Consultants:  Cardiology   Procedures:  Echo Study Conclusions  - Left ventricle: The cavity size was normal. Wall thickness was   normal. Systolic function was vigorous. The estimated ejection   fraction was in the range of 65% to 70%. Wall motion was normal;  there were no regional wall motion abnormalities. Features are   consistent with a pseudonormal left ventricular filling pattern,   with concomitant abnormal relaxation and increased filling   pressure (grade 2 diastolic dysfunction). - Aortic valve: Trileaflet; moderately thickened, moderately   calcified leaflets. There was mild stenosis. Peak velocity (S):   276 cm/s. Mean gradient (S): 15 mm Hg. Valve area (VTI): 1.69   cm^2. Valve area (Vmax): 1.76 cm^2. Valve area (Vmean): 1.64   cm^2. - Mitral valve: Moderately calcified annulus. Mildly thickened   leaflets . There was moderate regurgitation. - Left atrium: The atrium was moderately to severely dilated.   Volume/bsa, S: 49.5 ml/m^2. - Right atrium: The atrium was moderately dilated. - Tricuspid valve: There was moderate regurgitation. - Pulmonary arteries: Systolic pressure was severely increased. PA   peak pressure: 70 mm Hg (S).  Impressions:  - Compared to the prior study, there has been no significant   interval change.  Antimicrobials:  Ceftriaxone 10/25 >> 10/28  Interval history/Subjective: Started wheezing last night, improved with breathing treatment and IV Lasix.  Per family and RN, wheezing returned and worsening over the last few hours.  Patient denies complaints but history has been unreliable.  Objective: Vitals:   Vitals:   03/29/17 0431 03/29/17 1237  BP: (!) 160/67 (!) 147/75  Pulse: 78 67  Resp: 18 (!) 30  Temp: 97.7 F (36.5 C)   SpO2: 93% 96%    Exam:  Constitutional:  . Appears ill, tachypneic Eyes:  . pupils and irises appear normal . Normal lids ENMT:  . Hard of hearing . Lips appear normal Respiratory:  Marland Kitchen Very poor air movement, prolonged expiration, upper airway wheeze, no rales or rhonchi Moderate increased respiratory effort  Cardiovascular:  . RRR, no m/r/g . No LE extremity edema   Abdomen:  . Abdomen soft Psychiatric:  . Mental status o Appears confused  I have  personally reviewed the following:   UOP incompletely recorded last 24 hours  Labs:    BUN 60 >> 59 >> 56  Creatinine 2.99 >> 3.28 >> 3.07 >> 2.86  INR 2.71 >> 3.5  Imaging studies:     Scheduled Meds: . atorvastatin  20 mg Oral Daily  . cholecalciferol  1,000 Units Oral q morning - 10a  . escitalopram  10 mg Oral Daily  . feeding supplement (ENSURE ENLIVE)  237 mL Oral BID BM  . fenofibrate  160 mg Oral Daily  . furosemide  40 mg Intravenous Once  . gabapentin  100 mg Oral QHS  . guaiFENesin  600 mg Oral BID  . hydrALAZINE  75 mg Oral BID  . insulin aspart  0-15 Units Subcutaneous TID WC  . insulin aspart  0-5 Units Subcutaneous QHS  . insulin aspart  5 Units Subcutaneous TID WC  . insulin detemir  5 Units Subcutaneous Daily  . ipratropium-albuterol  3 mL Nebulization Q4H  . iron polysaccharides  150 mg Oral Daily  . isosorbide mononitrate  30 mg Oral Daily  . methimazole  5 mg Oral Daily  . methylPREDNISolone (SOLU-MEDROL) injection  60 mg Intravenous Q6H  . multivitamin with minerals  1 tablet Oral Daily  . polyethylene glycol  17 g Oral Daily  . sodium chloride flush  3 mL Intravenous Q12H  . torsemide  40 mg Oral Daily  . Warfarin - Pharmacist Dosing Inpatient   Does not apply q1800   Continuous Infusions: . sodium chloride Stopped (03/29/17 0150)    Active Problems:   COPD (chronic obstructive pulmonary disease) (HCC)   Paroxysmal atrial fibrillation (HCC)   Long term current use of anticoagulant therapy   Chronic respiratory failure with hypoxia (HCC)   Type II diabetes mellitus (HCC)   AKI (acute kidney injury) (HCC)   CKD (chronic kidney disease), stage IV (HCC)   Acute on chronic diastolic CHF (congestive heart failure) (HCC)   Severe pulmonary arterial systolic hypertension (HCC)   LOS: 9 days

## 2017-03-29 NOTE — Progress Notes (Signed)
Pt placed on bipap per MD order.  No distress noted, sats 96% on 3.5L [place].  BBS diminished with faint crackles.  Pt agreeable to try bipap for a couple of hours to assist in fluid removal from her lungs based on xray.  PT is tolerating well.  RN updated and at bedside.  RT will continue to monitor.

## 2017-03-30 DIAGNOSIS — J441 Chronic obstructive pulmonary disease with (acute) exacerbation: Secondary | ICD-10-CM

## 2017-03-30 LAB — GLUCOSE, CAPILLARY
GLUCOSE-CAPILLARY: 283 mg/dL — AB (ref 65–99)
GLUCOSE-CAPILLARY: 219 mg/dL — AB (ref 65–99)
GLUCOSE-CAPILLARY: 233 mg/dL — AB (ref 65–99)
GLUCOSE-CAPILLARY: 242 mg/dL — AB (ref 65–99)
GLUCOSE-CAPILLARY: 188 mg/dL — AB (ref 65–99)

## 2017-03-30 LAB — BASIC METABOLIC PANEL
Anion gap: 10 (ref 5–15)
BUN: 62 mg/dL — ABNORMAL HIGH (ref 6–20)
CHLORIDE: 102 mmol/L (ref 101–111)
CO2: 27 mmol/L (ref 22–32)
Calcium: 8.7 mg/dL — ABNORMAL LOW (ref 8.9–10.3)
Creatinine, Ser: 2.62 mg/dL — ABNORMAL HIGH (ref 0.44–1.00)
GFR calc non Af Amer: 16 mL/min — ABNORMAL LOW (ref >60)
GFR, EST AFRICAN AMERICAN: 18 mL/min — AB (ref >60)
Glucose, Bld: 242 mg/dL — ABNORMAL HIGH (ref 65–99)
POTASSIUM: 4 mmol/L (ref 3.5–5.1)
SODIUM: 139 mmol/L (ref 135–145)

## 2017-03-30 LAB — CBC
HCT: 24.8 % — ABNORMAL LOW (ref 36.0–46.0)
HEMOGLOBIN: 7.5 g/dL — AB (ref 12.0–15.0)
MCH: 26.6 pg (ref 26.0–34.0)
MCHC: 30.2 g/dL (ref 30.0–36.0)
MCV: 87.9 fL (ref 78.0–100.0)
PLATELETS: 237 10*3/uL (ref 150–400)
RBC: 2.82 MIL/uL — AB (ref 3.87–5.11)
RDW: 16.2 % — ABNORMAL HIGH (ref 11.5–15.5)
WBC: 8 10*3/uL (ref 4.0–10.5)

## 2017-03-30 LAB — PROTIME-INR
INR: 6.48
Prothrombin Time: 56.4 seconds — ABNORMAL HIGH (ref 11.4–15.2)

## 2017-03-30 LAB — MRSA PCR SCREENING: MRSA BY PCR: NEGATIVE

## 2017-03-30 MED ORDER — SENNA 8.6 MG PO TABS
1.0000 | ORAL_TABLET | Freq: Every day | ORAL | Status: DC
  Administered 2017-03-30 – 2017-04-07 (×9): 8.6 mg via ORAL
  Filled 2017-03-30 (×9): qty 1

## 2017-03-30 MED ORDER — FUROSEMIDE 10 MG/ML IJ SOLN
40.0000 mg | Freq: Once | INTRAMUSCULAR | Status: AC
  Administered 2017-03-30: 40 mg via INTRAVENOUS
  Filled 2017-03-30: qty 4

## 2017-03-30 MED ORDER — POLYETHYLENE GLYCOL 3350 17 G PO PACK
17.0000 g | PACK | Freq: Two times a day (BID) | ORAL | Status: DC
  Administered 2017-03-30 – 2017-04-08 (×12): 17 g via ORAL
  Filled 2017-03-30 (×15): qty 1

## 2017-03-30 MED ORDER — PHYTONADIONE 1 MG/0.5 ML ORAL SOLUTION
1.0000 mg | Freq: Once | ORAL | Status: AC
  Administered 2017-03-30: 1 mg via ORAL
  Filled 2017-03-30: qty 0.5

## 2017-03-30 NOTE — Progress Notes (Signed)
PROGRESS NOTE  Alexis [name] JOI:[id number] DOB: 03-04-1932 DOA: 03/20/2017 PCP: Ladora Jeda Pardue, PA-C  Brief Narrative: 82 year old woman PMH diastolic heart failure, oxygen dependent COPD presented with increasing shortness of breath, orthopnea and worsening hypoxia.  Admitted for acute on chronic diastolic congestive heart failure, acute on chronic hypoxic respiratory failure. Gradually improved with diuresis but developed AKI prolonging hospitalization. Renal function peaked but then developed acute COPD exacerbation and recurrent CHF necessitating BiPAP and transfer to SDU.  Assessment/Plan Acute on chronic hypoxic respiratory failure; COPD exacerbation; possible pneumonia, multifocal. - much improved with BiPAP overnight. - CXR yesterday appeared better than admission. CHF +/- pneumonia. - continue IV Lasix today - continue steroids, bronchodilators, abx  Acute on chronic diastolic congestive heart failure - no evidence of volume overload on exam, lungs clear - continue Lasix as above as vol overload has never been seen peripherally.  AKI superimposed on CKD stage IV. Renal u/s showed medical renal disease. U/A showed asymptomatic bacturia - continues to improve with diuresis.  E coli UTI -treated with abx  Anemia of chronic kidney disease - remains stable.  Atrial fibrillation, CHA2DS2-VASc 6. -INR significantly higher today secondary to abx. - hold warfarin. Low dose vitamin K.  Diabetes mellitus type 2. Hgb A1c 8.8. -remains stable. Continue Levemir.  Severe pulmonary HTN -presumably from second-hand smoke exposure.   Much improved today.   DVT prophylaxis: warfarin Code Status: DNR Family Communication: daughter and son at bedside 11/4 Disposition Plan: HH PT   Brendia Sacks, MD  [facility] Direct contact: [phone] --Via amion app OR  --[email or web address]; password [email or web address]  7PM-7AM contact night coverage as above 03/30/2017, 9:12 AM  LOS: 10 days    Consultants:  Cardiology   Procedures:  Echo Study Conclusions  - Left ventricle: The cavity size was normal. Wall thickness was   normal. Systolic function was vigorous. The estimated ejection   fraction was in the range of 65% to 70%. Wall motion was normal;   there were no regional wall motion abnormalities. Features are   consistent with a pseudonormal left ventricular filling pattern,   with concomitant abnormal relaxation and increased filling   pressure (grade 2 diastolic dysfunction). - Aortic valve: Trileaflet; moderately thickened, moderately   calcified leaflets. There was mild stenosis. Peak velocity (S):   276 cm/s. Mean gradient (S): 15 mm Hg. Valve area (VTI): 1.69   cm^2. Valve area (Vmax): 1.76 cm^2. Valve area (Vmean): 1.64   cm^2. - Mitral valve: Moderately calcified annulus. Mildly thickened   leaflets . There was moderate regurgitation. - Left atrium: The atrium was moderately to severely dilated.   Volume/bsa, S: 49.5 ml/m^2. - Right atrium: The atrium was moderately dilated. - Tricuspid valve: There was moderate regurgitation. - Pulmonary arteries: Systolic pressure was severely increased. PA   peak pressure: 70 mm Hg (S).  Impressions:  - Compared to the prior study, there has been no significant   interval change.  Antimicrobials:  Vancomycin 11/3 >>  Cefepime 11/3 >>  Interval history/Subjective: Used BiPAP last night.  Feels fine, breathing fine, no complaints.  Objective: Vitals:   Vitals:   03/30/17 0831 03/30/17 0839  BP:    Pulse:    Resp:    Temp: 98.1 F (36.7 C)   SpO2:  94%    Exam:  Constitutional:  . Appears calm and comfortable. Much better today. Eyes:  . pupils and irises appear normal ENMT:  . grossly normal hearing  . Lips appear normal Respiratory:  . CTA  bilaterally, no w/r/r.  . Respiratory effort normal.  Cardiovascular:  . RRR, no m/r/g . No LE extremity edema   Skin:  . No rashes, lesions,  ulcers . palpation of skin: no induration or nodules Psychiatric:  . judgement and insight appear normal . Mental status o Mood, affect appropriate   I have personally reviewed the following:   UOP -1287 Last BM: 11/1 Tele: SR  Labs:  CBG stable  Creatinine 3.07 >> 2.86 >> 2.62  Hgb stable 7.8 >> 7.5  Lactic acid WNL  ABG reviewed  INR 2.71 >> 3.5 >> 6.48 (abx)  Imaging studies:     Scheduled Meds: . atorvastatin  20 mg Oral Daily  . cholecalciferol  1,000 Units Oral q morning - 10a  . escitalopram  10 mg Oral Daily  . feeding supplement (ENSURE ENLIVE)  237 mL Oral BID BM  . fenofibrate  160 mg Oral Daily  . furosemide  40 mg Intravenous Once  . gabapentin  100 mg Oral QHS  . guaiFENesin  600 mg Oral BID  . hydrALAZINE  75 mg Oral BID  . insulin aspart  0-15 Units Subcutaneous Q6H  . insulin detemir  5 Units Subcutaneous Daily  . ipratropium-albuterol  3 mL Nebulization TID  . isosorbide mononitrate  30 mg Oral Daily  . methimazole  5 mg Oral Daily  . methylPREDNISolone (SOLU-MEDROL) injection  60 mg Intravenous Q6H  . multivitamin with minerals  1 tablet Oral Daily  . phytonadione  1 mg Oral Once  . polyethylene glycol  17 g Oral BID  . senna  1 tablet Oral QHS  . sodium chloride flush  3 mL Intravenous Q12H  . Warfarin - Pharmacist Dosing Inpatient   Does not apply q1800   Continuous Infusions: . sodium chloride Stopped (03/29/17 0150)  . ceFEPime (MAXIPIME) IV Stopped (03/29/17 1609)  . [START ON 03/31/2017] vancomycin      Active Problems:   COPD (chronic obstructive pulmonary disease) (HCC)   Paroxysmal atrial fibrillation (HCC)   Long term current use of anticoagulant therapy   Chronic respiratory failure with hypoxia (HCC)   Type II diabetes mellitus (HCC)   AKI (acute kidney injury) (HCC)   CKD (chronic kidney disease), stage IV (HCC)   Acute on chronic diastolic CHF (congestive heart failure) (HCC)   Severe pulmonary arterial systolic  hypertension (HCC)   COPD with acute exacerbation (HCC)   LOS: 10 days

## 2017-03-30 NOTE — Progress Notes (Signed)
Hospitalist paged regarding critical INR (6.48) and Hgb (7.5). Waiting for call back. On coming nurse [place] updated.

## 2017-03-30 NOTE — Progress Notes (Signed)
ANTICOAGULATION CONSULT NOTE   Pharmacy Consult for Warfarin Indication: atrial fibrillation  Allergies  Allergen Reactions  . Yellow Dyes (Non-Tartrazine) Nausea And Vomiting    "deathly allergic" No other information given to explain reaction. Per Daughter- this was an IV dye. Ok with yellow coloring on Coumadin tablets.   . Codeine Hives  . Other Other (See Comments)    novocaine broke mouth out  . Penicillins Hives  . Procaine Hives    Labs: Recent Labs    03/28/17 0337 03/28/17 1056 03/29/17 0521 03/29/17 1021 03/30/17 0245  HGB  --   --   --   --  7.5*  HCT  --   --   --   --  24.8*  PLT  --   --   --   --  237  LABPROT 28.5*  --  34.9*  --  56.4*  INR 2.71  --  3.50  --  6.48*  CREATININE  --  3.07*  --  2.86* 2.62*    Estimated Creatinine Clearance: 15.2 mL/min (A) (by C-G formula based on SCr of 2.62 mg/dL (H)).   Assessment: 64 YOF on warfarin PTA for AFib- Home dose (as per Coumadin Clinic note on 10/12) was 4mg  daily except 6mg  on Mondays- dose was reduced to that regimen during that appointment d/t INR of 3.6 in the clinic.  INR up to 6.48 (very rapid increase). Patient on concomitant therapies that effect INR levels - fenofibrate (increases INR), and methimazole (decreases INR).    Goal of Therapy:  INR 2-3 Monitor platelets by anticoagulation protocol: Yes   Plan:  Hold Warfarin dose again tonight Daily INR and CBC Follow s/s bleeding  Alexis [name], PharmD Pharmacy Resident Pager: [phone]

## 2017-03-30 NOTE — Plan of Care (Addendum)
  Fall risk bundle in place. No falls, injuries or skin breakdown this shift. Hourly rounding implemented. Will continue to implement safety measures. Bed alarm on, pt's room near nurses station, call light in reach and pt demonstrated use. Will continue to monitor pt.  Pt resting comfortably at this time.

## 2017-03-31 ENCOUNTER — Other Ambulatory Visit: Payer: Self-pay

## 2017-03-31 DIAGNOSIS — J9621 Acute and chronic respiratory failure with hypoxia: Secondary | ICD-10-CM

## 2017-03-31 DIAGNOSIS — Z7901 Long term (current) use of anticoagulants: Secondary | ICD-10-CM

## 2017-03-31 LAB — CBC
HEMATOCRIT: 23.4 % — AB (ref 36.0–46.0)
HEMOGLOBIN: 7.2 g/dL — AB (ref 12.0–15.0)
MCH: 26.9 pg (ref 26.0–34.0)
MCHC: 30.8 g/dL (ref 30.0–36.0)
MCV: 87.3 fL (ref 78.0–100.0)
Platelets: 265 10*3/uL (ref 150–400)
RBC: 2.68 MIL/uL — ABNORMAL LOW (ref 3.87–5.11)
RDW: 16.7 % — AB (ref 11.5–15.5)
WBC: 12.4 10*3/uL — AB (ref 4.0–10.5)

## 2017-03-31 LAB — BASIC METABOLIC PANEL
Anion gap: 10 (ref 5–15)
BUN: 77 mg/dL — AB (ref 6–20)
CO2: 27 mmol/L (ref 22–32)
CREATININE: 2.71 mg/dL — AB (ref 0.44–1.00)
Calcium: 8.9 mg/dL (ref 8.9–10.3)
Chloride: 103 mmol/L (ref 101–111)
GFR calc Af Amer: 18 mL/min — ABNORMAL LOW (ref >60)
GFR, EST NON AFRICAN AMERICAN: 15 mL/min — AB (ref >60)
GLUCOSE: 206 mg/dL — AB (ref 65–99)
POTASSIUM: 3.9 mmol/L (ref 3.5–5.1)
SODIUM: 140 mmol/L (ref 135–145)

## 2017-03-31 LAB — PROTIME-INR
INR: 3.96
PROTHROMBIN TIME: 38.4 s — AB (ref 11.4–15.2)

## 2017-03-31 LAB — GLUCOSE, CAPILLARY
GLUCOSE-CAPILLARY: 317 mg/dL — AB (ref 65–99)
Glucose-Capillary: 222 mg/dL — ABNORMAL HIGH (ref 65–99)
Glucose-Capillary: 205 mg/dL — ABNORMAL HIGH (ref 65–99)
Glucose-Capillary: 351 mg/dL — ABNORMAL HIGH (ref 65–99)
Glucose-Capillary: 262 mg/dL — ABNORMAL HIGH (ref 65–99)

## 2017-03-31 MED ORDER — METOPROLOL TARTRATE 12.5 MG HALF TABLET
12.5000 mg | ORAL_TABLET | Freq: Two times a day (BID) | ORAL | Status: DC
  Administered 2017-03-31 – 2017-04-08 (×17): 12.5 mg via ORAL
  Filled 2017-03-31 (×17): qty 1

## 2017-03-31 MED ORDER — INSULIN ASPART 100 UNIT/ML ~~LOC~~ SOLN
0.0000 [IU] | Freq: Three times a day (TID) | SUBCUTANEOUS | Status: DC
Start: 2017-03-31 — End: 2017-04-08
  Administered 2017-03-31: 15 [IU] via SUBCUTANEOUS
  Administered 2017-04-01: 5 [IU] via SUBCUTANEOUS
  Administered 2017-04-01: 3 [IU] via SUBCUTANEOUS
  Administered 2017-04-01 – 2017-04-02 (×2): 8 [IU] via SUBCUTANEOUS
  Administered 2017-04-02: 5 [IU] via SUBCUTANEOUS
  Administered 2017-04-02: 8 [IU] via SUBCUTANEOUS
  Administered 2017-04-03: 5 [IU] via SUBCUTANEOUS
  Administered 2017-04-03: 3 [IU] via SUBCUTANEOUS
  Administered 2017-04-03: 5 [IU] via SUBCUTANEOUS
  Administered 2017-04-04: 3 [IU] via SUBCUTANEOUS
  Administered 2017-04-04 – 2017-04-05 (×2): 2 [IU] via SUBCUTANEOUS
  Administered 2017-04-05 – 2017-04-06 (×3): 5 [IU] via SUBCUTANEOUS
  Administered 2017-04-06: 2 [IU] via SUBCUTANEOUS
  Administered 2017-04-06: 5 [IU] via SUBCUTANEOUS

## 2017-03-31 MED ORDER — FUROSEMIDE 10 MG/ML IJ SOLN
40.0000 mg | Freq: Once | INTRAMUSCULAR | Status: AC
  Administered 2017-03-31: 40 mg via INTRAVENOUS
  Filled 2017-03-31: qty 4

## 2017-03-31 MED ORDER — INSULIN ASPART 100 UNIT/ML ~~LOC~~ SOLN
3.0000 [IU] | Freq: Three times a day (TID) | SUBCUTANEOUS | Status: DC
  Administered 2017-03-31: 3 [IU] via SUBCUTANEOUS
  Administered 2017-04-01: 8 [IU] via SUBCUTANEOUS
  Administered 2017-04-01 – 2017-04-02 (×4): 3 [IU] via SUBCUTANEOUS

## 2017-03-31 NOTE — Progress Notes (Signed)
Patient is currently on [facility] with sats of 95%. Patient is in no distress and all vitals are stable. BIPAP is in room on standby but is not needed at this time.

## 2017-03-31 NOTE — Progress Notes (Signed)
Fall risk bundle in place. No falls, skin break down or other injuries this shift. Pt did not need bipap overnight and was sating well on [place].

## 2017-03-31 NOTE — Progress Notes (Signed)
Inpatient Diabetes Program Recommendations  AACE/ADA: New Consensus Statement on Inpatient Glycemic Control (2015)  Target Ranges:  Prepandial:   less than 140 mg/dL      Peak postprandial:   less than 180 mg/dL (1-2 hours)      Critically ill patients:  140 - 180 mg/dL   Lab Results  Component Value Date   GLUCAP 205 (H) 03/31/2017   HGBA1C 8.8 (H) 03/22/2017    Review of Glycemic Control Results for Alexis [name], Alexis [name] (MRN [id number]) as of 03/31/2017 11:10  Ref. Range 03/30/2017 11:27 03/30/2017 17:14 03/30/2017 20:53 03/31/2017 01:36 03/31/2017 08:00  Glucose-Capillary Latest Ref Range: 65 - 99 mg/dL 004 (H) 599 (H) 774 (H) 222 (H) 205 (H)   Inpatient Diabetes Program Recommendations:    -Consider Novolog 3 units tid meal coverage while on steroids if eats 50% -Consider Tradjenta on D/C  Thank you, Darel Hong E. Tameem Pullara, RN, MSN, CDE  Diabetes Coordinator Inpatient Glycemic Control Team Team Pager [phone] (8am-5pm) 03/31/2017 11:12 AM

## 2017-03-31 NOTE — Evaluation (Addendum)
Clinical/Bedside Swallow Evaluation Patient Details  Name: Alexis [name] MRN: [id number] Date of Birth: 06/28/1931  Today's Date: 03/31/2017 Time: SLP Start Time (ACUTE ONLY): 0911 SLP Stop Time (ACUTE ONLY): [id number] SLP Time Calculation (min) (ACUTE ONLY): 11 min  Past Medical History:  Past Medical History:  Diagnosis Date  . Anxiety   . Chronic diastolic CHF (congestive heart failure) (HCC)    a. 05/2015 Echo: EF 55-60%, mild LVH, no rwma, mild MR, mildly dil RA, mod TR, PASP 58mmHg.  . CKD (chronic kidney disease), stage IV (HCC)   . Confusion state   . COPD (chronic obstructive pulmonary disease) (HCC)    a. on home O2 @ 3lpm.  . Elevated troponin    a. H/o elevated trop in setting of CHF; b. 06/2013 Lexiscan MV: nl EF, mild anterior breast attenuation, no ischemia.   . Family history of adverse reaction to anesthesia   . GIB (gastrointestinal bleeding)    a. History of GIB.  Marland Kitchen. Hypertensive heart disease   . Macular degeneration, bilateral   . Myocardial infarction (HCC)   . Neuropathy   . Paroxysmal atrial fibrillation (HCC)    a. CHA2DS2VASc = 6-->coumadin.  Marland Kitchen. PONV (postoperative nausea and vomiting)   . Type II diabetes mellitus (HCC)   . Varicose veins    Past Surgical History:  Past Surgical History:  Procedure Laterality Date  . ABDOMINAL HYSTERECTOMY    . FRACTURE SURGERY    . HIP FRACTURE SURGERY  2004  . VEIN SURGERY     HPI:  [name] [age]o.female,with past medical history relevant for diastolic dysfunction CHF, COPD on 2-3 L of oxygen at home, DM, MI admitted with a 2 to three-day history of worsening shortness of breath, orthopnea and increased oxygen requirement.CXR 11/3 findings are similar to the most recent prior exam with bilateral patchy airspace lung opacities. Name opacity at the lung bases is likely due to the development of small pleural effusions. Findings are consistent with either asymmetric pulmonary edema or multifocal  pneumonia. MBS 02/28/16 functional swallow, slight oral holding; barium pill hesitated mid esophagus and transited iwth additional puree. Reg/thin recommended.    Assessment / Plan / Recommendation Clinical Impression  Pt observed to have possible esophageal impairment (not diagnosed) as observed during MBS last year. Delayed cough with consecutive sips thin with straw; increased respiratory effort requiring rest breaks. Verbal cues given to take small sips. Pt states she takes pills whole in applesauce at home. Increased risk for silent aspiration given COPD however do not feel MBS is warranted at present. To conserve energy recommend she continue Dys 2 texture, thin liquids, pills whole in applesauce, sit upright, small sips and stay upright min 45 minutes after meals. ST will continue to follow.    SLP Visit Diagnosis: Dysphagia, unspecified (R13.10)    Aspiration Risk  (mild-mod)    Diet Recommendation Dysphagia 2 (Fine chop);Thin liquid   Liquid Administration via: Straw;Cup Medication Administration: Whole meds with puree Supervision: Patient able to self feed;Full supervision/cueing for compensatory strategies Compensations: Slow rate;Small sips/bites(rest breaks) Postural Changes: Seated upright at 90 degrees;Remain upright for at least 30 minutes after po intake    Other  Recommendations Oral Care Recommendations: Oral care BID   Follow up Recommendations None      Frequency and Duration min 2x/week  2 weeks       Prognosis Prognosis for Safe Diet Advancement: Good      Swallow Study   General HPI: [name] [id number] y.o.female,with past  medical history relevant for diastolic dysfunction CHF, COPD on 2-3 L of oxygen at home, DM, MI admitted with a 2 to three-day history of worsening shortness of breath, orthopnea and increased oxygen requirement.CXR 11/3 findings are similar to the most recent prior exam with bilateral patchy airspace lung opacities. Name opacity  at the lung bases is likely due to the development of small pleural effusions. Findings are consistent with either asymmetric pulmonary edema or multifocal pneumonia. MBS 02/28/16 functional swallow, slight oral holding; barium pill hesitated mid esophagus and transited iwth additional puree. Reg/thin recommended.  Type of Study: Bedside Swallow Evaluation Previous Swallow Assessment: (see HPI) Diet Prior to this Study: Dysphagia 2 (chopped);Thin liquids Temperature Spikes Noted: No Respiratory Status: Nasal cannula History of Recent Intubation: No Behavior/Cognition: Alert;Cooperative;Pleasant mood Oral Cavity Assessment: Within Functional Limits Oral Care Completed by SLP: No Oral Cavity - Dentition: Dentures, top;Dentures, bottom Vision: Functional for self-feeding Self-Feeding Abilities: Able to feed self Patient Positioning: Upright in bed Baseline Vocal Quality: Normal Volitional Cough: Strong Volitional Swallow: Able to elicit    Oral/Motor/Sensory Function Overall Oral Motor/Sensory Function: Within functional limits   Ice Chips Ice chips: Not tested   Thin Liquid Thin Liquid: Impaired Presentation: Cup;Straw Oral Phase Impairments: (none) Oral Phase Functional Implications: (none) Pharyngeal  Phase Impairments: Cough - Delayed;Decreased hyoid-laryngeal movement    Nectar Thick Nectar Thick Liquid: Not tested   Honey Thick Honey Thick Liquid: Not tested   Puree Puree: Within functional limits   Solid   GO   Solid: Impaired Pharyngeal Phase Impairments: Other (comments);Change in Vital Signs(increased respiratory effort (min))        Alexis [name] 03/31/2017,9:37 AM  Alexis [name].Ed [employer] [phone]

## 2017-03-31 NOTE — Progress Notes (Signed)
PROGRESS NOTE  Alexis [name] ZOX:[id number] DOB: 12/14/1931 DOA: 03/20/2017 PCP: Alexis DanielBeal, [name], PA-C  Brief Narrative: 83 year old woman PMH diastolic heart failure, oxygen dependent COPD presented with increasing shortness of breath, orthopnea and worsening hypoxia.  Admitted for acute on chronic diastolic congestive heart failure, acute on chronic hypoxic respiratory failure. Gradually improved with diuresis but developed AKI prolonging hospitalization. Renal function peaked but then developed acute COPD exacerbation and recurrent CHF necessitating BiPAP and transfer to SDU.  Assessment/Plan Acute on chronic hypoxic respiratory failure; COPD exacerbation; possible pneumonia, multifocal. - Stable compared to yesterday but still SOB - continue steroids, bronchodilators, abx today and BiPAP QHS and PRN  Acute on chronic diastolic congestive heart failure - no peripheral edema. Renal fxn stable. Lasix again today based on lung exam. Vol overload has never been seen peripherally.  AKI superimposed on CKD stage IV. Renal u/s showed medical renal disease. U/A showed asymptomatic bacturia - remains stable with diuresis. Daily BMP.  Atrial fibrillation, CHA2DS2-VASc 6. - INR down s/p vitamin K. Continue to hold warfarin. INR in AM  E coli UTI -treated with abx  Anemia of chronic kidney disease - stable.  Diabetes mellitus type 2. Hgb A1c 8.8. - stable, continue Levemir  Severe pulmonary HTN -presumably from second-hand smoke exposure.    stable but no improvement compared to yesterday. Continue current tx  DVT prophylaxis: warfarin Code Status: DNR Family Communication: son at bedside 11/5 Disposition Plan: HH PT   Alexis Sacksaniel Goodrich, MD  [facility] Direct contact: [phone] --Via amion app OR  --[email or web address]; password [email or web address]  7PM-7AM contact night coverage as above 03/31/2017, 9:12 AM  LOS: 11 days   Consultants:  Cardiology   Procedures:  Echo Study  Conclusions  - Left ventricle: The cavity size was normal. Wall thickness was   normal. Systolic function was vigorous. The estimated ejection   fraction was in the range of 65% to 70%. Wall motion was normal;   there were no regional wall motion abnormalities. Features are   consistent with a pseudonormal left ventricular filling pattern,   with concomitant abnormal relaxation and increased filling   pressure (grade 2 diastolic dysfunction). - Aortic valve: Trileaflet; moderately thickened, moderately   calcified leaflets. There was mild stenosis. Peak velocity (S):   276 cm/s. Mean gradient (S): 15 mm Hg. Valve area (VTI): 1.69   cm^2. Valve area (Vmax): 1.76 cm^2. Valve area (Vmean): 1.64   cm^2. - Mitral valve: Moderately calcified annulus. Mildly thickened   leaflets . There was moderate regurgitation. - Left atrium: The atrium was moderately to severely dilated.   Volume/bsa, S: 49.5 ml/m^2. - Right atrium: The atrium was moderately dilated. - Tricuspid valve: There was moderate regurgitation. - Pulmonary arteries: Systolic pressure was severely increased. PA   peak pressure: 70 mm Hg (S).  Impressions:  - Compared to the prior study, there has been no significant   interval change.  Antimicrobials:  Vancomycin 11/3 >>  Cefepime 11/3 >>  Interval history/Subjective: Breathing ok. No n/v.   Objective: Vitals:   Vitals:   03/31/17 0427 03/31/17 0725  BP: (!) 153/73 (!) 139/54  Pulse: 86 (!) 102  Resp: 18 (!) 25  Temp: 97.7 F (36.5 C) 98.4 F (36.9 C)  SpO2: 95% 93%    Exam:  Constitutional:  . Appears calm and comfortable Eyes:  . pupils and irises appear normal ENMT:  . grossly normal hearing  . Lips appear normal Respiratory:  . Fair air movement, wheezes and rhonchi noted. .Marland Kitchen  Respiratory effort moderately increased. Speaks in short sentences. Cardiovascular:  . Tachycardic, regular rhythm, no m/r/g . No LE extremity edema   Musculoskeletal:    . Digits/nails BUE: no clubbing, cyanosis, petechiae, infection . RUE, LUE, RLE, LLE   o strength and tone normal, no atrophy, no abnormal movements Psychiatric:  . Mental status o Mood, affect appropriate   I have personally reviewed the following:   UOP: 1000 I/O: -350 Last BM: 11/1 Foley: no Tele: ST  Labs:  CBG stable  Creatinine 3.07 >> 2.86 >> 2.62 >> 2.71  Hgb stable 7.8 >> 7.5 >> 7.2  INR 2.71 >> 3.5 >> 6.48 >> 3.96  Imaging studies:     Scheduled Meds: . atorvastatin  20 mg Oral Daily  . cholecalciferol  1,000 Units Oral q morning - 10a  . escitalopram  10 mg Oral Daily  . feeding supplement (ENSURE ENLIVE)  237 mL Oral BID BM  . fenofibrate  160 mg Oral Daily  . gabapentin  100 mg Oral QHS  . guaiFENesin  600 mg Oral BID  . hydrALAZINE  75 mg Oral BID  . insulin aspart  0-15 Units Subcutaneous Q6H  . insulin detemir  5 Units Subcutaneous Daily  . ipratropium-albuterol  3 mL Nebulization TID  . isosorbide mononitrate  30 mg Oral Daily  . methimazole  5 mg Oral Daily  . methylPREDNISolone (SOLU-MEDROL) injection  60 mg Intravenous Q6H  . metoprolol tartrate  12.5 mg Oral BID  . multivitamin with minerals  1 tablet Oral Daily  . polyethylene glycol  17 g Oral BID  . senna  1 tablet Oral QHS  . sodium chloride flush  3 mL Intravenous Q12H  . Warfarin - Pharmacist Dosing Inpatient   Does not apply q1800   Continuous Infusions: . sodium chloride Stopped (03/29/17 0150)  . ceFEPime (MAXIPIME) IV Stopped (03/30/17 1658)    Principal Problem:   COPD with acute exacerbation (HCC) Active Problems:   COPD (chronic obstructive pulmonary disease) (HCC)   Paroxysmal atrial fibrillation (HCC)   Long term current use of anticoagulant therapy   Chronic respiratory failure with hypoxia (HCC)   Type II diabetes mellitus (HCC)   AKI (acute kidney injury) (HCC)   CKD (chronic kidney disease), stage IV (HCC)   Acute on chronic diastolic CHF (congestive heart  failure) (HCC)   Severe pulmonary arterial systolic hypertension (HCC)   Acute on chronic respiratory failure with hypoxia (HCC)   LOS: 11 days

## 2017-03-31 NOTE — Progress Notes (Signed)
ANTICOAGULATION CONSULT NOTE   Pharmacy Consult for Warfarin Indication: atrial fibrillation  Allergies  Allergen Reactions  . Yellow Dyes (Non-Tartrazine) Nausea And Vomiting    "deathly allergic" No other information given to explain reaction. Per Daughter- this was an IV dye. Ok with yellow coloring on Coumadin tablets.   . Codeine Hives  . Other Other (See Comments)    novocaine broke mouth out  . Penicillins Hives  . Procaine Hives    Labs: Recent Labs    03/29/17 0521 03/29/17 1021 03/30/17 0245 03/31/17 0431  HGB  --   --  7.5* 7.2*  HCT  --   --  24.8* 23.4*  PLT  --   --  237 265  LABPROT 34.9*  --  56.4* 38.4*  INR 3.50  --  6.48* 3.96  CREATININE  --  2.86* 2.62* 2.71*    Estimated Creatinine Clearance: 14.6 mL/min (A) (by C-G formula based on SCr of 2.71 mg/dL (H)).   Assessment: 89 YOF on warfarin PTA for AFib - Home dose (as per Coumadin Clinic note on 10/12) was 4mg  daily except 6mg  on Mondays - dose was reduced to that regimen during that appointment d/t INR of 3.6 in the clinic.  INR down to 3.96 but supratherapeutic today s/p vitamin K 1 mg PO last night. No bleeding noted, Hgb low but stable in 7's, platelets are normal.    Goal of Therapy:  INR 2-3 Monitor platelets by anticoagulation protocol: Yes   Plan:  Hold warfarin dose tonight Daily INR and CBC Follow for s/sx bleeding   Loura Back, PharmD, BCPS Clinical Pharmacist Phone for today [phone] Main pharmacy - [phone] 03/31/2017 8:36 AM

## 2017-03-31 NOTE — Progress Notes (Signed)
Progress Note  Patient Name: Alexis [name] Date of Encounter: 03/31/2017  Primary Cardiologist: Dr [name]  Subjective   Mild dyspnea; no chest pain  Inpatient Medications    Scheduled Meds: . atorvastatin  20 mg Oral Daily  . cholecalciferol  1,000 Units Oral q morning - 10a  . escitalopram  10 mg Oral Daily  . feeding supplement (ENSURE ENLIVE)  237 mL Oral BID BM  . fenofibrate  160 mg Oral Daily  . gabapentin  100 mg Oral QHS  . guaiFENesin  600 mg Oral BID  . hydrALAZINE  75 mg Oral BID  . insulin aspart  0-15 Units Subcutaneous Q6H  . insulin detemir  5 Units Subcutaneous Daily  . ipratropium-albuterol  3 mL Nebulization TID  . isosorbide mononitrate  30 mg Oral Daily  . methimazole  5 mg Oral Daily  . methylPREDNISolone (SOLU-MEDROL) injection  60 mg Intravenous Q6H  . multivitamin with minerals  1 tablet Oral Daily  . polyethylene glycol  17 g Oral BID  . senna  1 tablet Oral QHS  . sodium chloride flush  3 mL Intravenous Q12H  . Warfarin - Pharmacist Dosing Inpatient   Does not apply q1800   Continuous Infusions: . sodium chloride Stopped (03/29/17 0150)  . ceFEPime (MAXIPIME) IV Stopped (03/30/17 1658)   PRN Meds: sodium chloride, acetaminophen **OR** acetaminophen, albuterol, ondansetron **OR** ondansetron (ZOFRAN) IV, sodium chloride flush, traZODone   Vital Signs    Vitals:   03/30/17 2049 03/31/17 0035 03/31/17 0427 03/31/17 0725  BP: (!) 123/47 (!) 141/69 (!) 153/73 (!) 139/54  Pulse: 86 86 86 (!) 102  Resp: 15 13 18  (!) 25  Temp: 98.3 F (36.8 C) 98.4 F (36.9 C) 97.7 F (36.5 C) 98.4 F (36.9 C)  TempSrc: Axillary Axillary Oral Oral  SpO2: 97% 98% 95% 93%  Weight:   159 lb (72.1 kg)   Height:        Intake/Output Summary (Last 24 hours) at 03/31/2017 0852 Last data filed at 03/31/2017 0700 Gross per 24 hour  Intake 650 ml  Output 1000 ml  Net -350 ml   Filed Weights   03/29/17 1430 03/30/17 0437 03/31/17 0427  Weight: 165 lb  5.5 oz (75 kg) 160 lb 8 oz (72.8 kg) 159 lb (72.1 kg)    Telemetry    Sinus with occasional PVC and transient PAF- Personally Reviewed   Physical Exam   GEN: WD WN No acute distress.   Neck: supple Cardiac: tachycardic, regular Respiratory: Mildly diminished BS bases GI: NT/ND MS: No edema Neuro:  Nonfocal, grossly intact   Labs    Chemistry Recent Labs  Lab 03/29/17 1021 03/30/17 0245 03/31/17 0431  NA 138 139 140  K 4.3 4.0 3.9  CL 106 102 103  CO2 23 27 27   GLUCOSE 289* 242* 206*  BUN 56* 62* 77*  CREATININE 2.86* 2.62* 2.71*  CALCIUM 8.1* 8.7* 8.9  GFRNONAA 14* 16* 15*  GFRAA 16* 18* 18*  ANIONGAP 9 10 10      Hematology Recent Labs  Lab 03/27/17 0537 03/30/17 0245 03/31/17 0431  WBC 8.3 8.0 12.4*  RBC 2.89* 2.82* 2.68*  HGB 7.8* 7.5* 7.2*  HCT 26.0* 24.8* 23.4*  MCV 90.0 87.9 87.3  MCH 27.0 26.6 26.9  MCHC 30.0 30.2 30.8  RDW 16.1* 16.2* 16.7*  PLT 221 237 265   Radiology    Dg Chest Port 1 View  Result Date: 03/29/2017 CLINICAL DATA:  Pt has SOB since  last night, SOB went away for a couple hours then came back this AM EXAM: PORTABLE CHEST 1 VIEW COMPARISON:  03/20/2017 FINDINGS: Cardiac silhouette is enlarged. No mediastinal or hilar masses. There is vascular congestion and patchy areas of airspace opacity. Additional opacity noted at both lung bases partly obscures hemidiaphragms likely due to small effusions. No pneumothorax. Skeletal structures are demineralized. There old left-sided rib fractures. IMPRESSION: 1. Findings are similar to the most recent prior exam with bilateral patchy airspace lung opacities. Name opacity at the lung bases is likely due to the development of small pleural effusions. Findings are consistent with either asymmetric pulmonary edema or multifocal pneumonia. Electronically Signed   By: Amie Portlandavid  Ormond M.D.   On: 03/29/2017 13:01    Cardiac Studies   Echocardiogram 03/21/17: - Left ventricle: The cavity size was  normal. Wall thickness was   normal. Systolic function was vigorous. The estimated ejection   fraction was in the range of 65% to 70%. Wall motion was normal;   there were no regional wall motion abnormalities. Features are   consistent with a pseudonormal left ventricular filling pattern,   with concomitant abnormal relaxation and increased filling   pressure (grade 2 diastolic dysfunction). - Aortic valve: Trileaflet; moderately thickened, moderately   calcified leaflets. There was mild stenosis. Peak velocity (S):   276 cm/s. Mean gradient (S): 15 mm Hg. Valve area (VTI): 1.69   cm^2. Valve area (Vmax): 1.76 cm^2. Valve area (Vmean): 1.64   cm^2. - Mitral valve: Moderately calcified annulus. Mildly thickened   leaflets . There was moderate regurgitation. - Left atrium: The atrium was moderately to severely dilated.   Volume/bsa, S: 49.5 ml/m^2. - Right atrium: The atrium was moderately dilated. - Tricuspid valve: There was moderate regurgitation. - Pulmonary arteries: Systolic pressure was severely increased. PA   peak pressure: 70 mm Hg (S).  Impressions:  - Compared to the prior study, there has been no significant   interval change.  Patient Profile     83 y.o. female admitted with acute on chronic diastolic heart failure, increased shortness of breath, fatigue  Assessment & Plan    Acute on chronic diastolic heart failure - Not markedly volume overloaded today; would hold diuresis given renal insuff and follow.  COPD exacerbation -Per IM  Paroxysmal atrial fibrillation -CHADSVASc score 6.  Has had cardioversion in the past. -Continue coumadin; will resume metoprolol for rate control if atrial fibrillation recurs.  Mild aortic stenosis/Moderate MR -conservative measures given overall medical condition.  For questions or updates, please contact [facility] Please consult [email or web address] for contact info under Cardiology/STEMI.      Signed, Olga MillersBrian Youcef Klas, MD    03/31/2017, 8:52 AM

## 2017-03-31 NOTE — Evaluation (Signed)
Occupational Therapy Evaluation Patient Details Name: Alexis [name] MRN: [id number] DOB: 07/04/1931 Today's Date: 03/31/2017    History of Present Illness Pt is an 83 y.o. female admitted 03/20/17 with SOB, orthopnea, weight gain, increased O2 requirement.  Patient with CHF exacerbation and UTI.   PMH:  CHF, PAF, DM, neuropathy, COPD on home O2 at 2L, HTN, CKD, anemia, macular degeneration, confusion.    Clinical Impression   Pt admitted with above. She demonstrates the below listed deficits and will benefit from continued OT to maximize safety and independence with BADLs.  Pt presents to OT with generalized weakness, decreased activity tolerance, impaired cognition, and impaired balance.  She requires min - mod A for ADLs.  She lives with son who assists her PTA.   She fatigues quickly with ADL activities.  Recommend HHOT at [facility].         Follow Up Recommendations  Home health OT;Supervision/Assistance - 24 hour    Equipment Recommendations  None recommended by OT    Recommendations for Other Services       Precautions / Restrictions Precautions Precautions: Fall Precaution Comments: SOB with all activity, on 3LO2via [place] Restrictions Weight Bearing Restrictions: No      Mobility Bed Mobility               General bed mobility comments: Pt sitting up in recliner   Transfers Overall transfer level: Needs assistance Equipment used: Rolling walker (2 wheeled) Transfers: Sit to/from Stand Sit to Stand: Min guard         General transfer comment: v/c's for hand placement, increased time    Balance Overall balance assessment: Needs assistance Sitting-balance support: No upper extremity supported;Feet supported Sitting balance-Leahy Scale: Good     Standing balance support: Bilateral upper extremity supported Standing balance-Leahy Scale: Poor Standing balance comment: reliant on UE support                            ADL either performed  or assessed with clinical judgement   ADL Overall ADL's : Needs assistance/impaired Eating/Feeding: Independent   Grooming: Wash/dry hands;Wash/dry face;Oral care;Brushing hair;Set up;Supervision/safety;Sitting   Upper Body Bathing: Minimal assistance;Sitting   Lower Body Bathing: Minimal assistance Lower Body Bathing Details (indicate cue type and reason): Pt incontinent of urine and was able to perform LB bath with min A for feet  Upper Body Dressing : Minimal assistance;Sitting   Lower Body Dressing: Moderate assistance;Sit to/from stand Lower Body Dressing Details (indicate cue type and reason): Pt requires assist to don socks  Toilet Transfer: Min guard;Stand-pivot;BSC;RW   Toileting- Clothing Manipulation and Hygiene: Minimal assistance;Sit to/from stand       Functional mobility during ADLs: Min guard;Rolling walker General ADL Comments: Pt fatigues quickly with activity      Vision         Perception     Praxis      Pertinent Vitals/Pain Pain Assessment: No/denies pain     Hand Dominance Right   Extremity/Trunk Assessment Upper Extremity Assessment Upper Extremity Assessment: Generalized weakness   Lower Extremity Assessment Lower Extremity Assessment: Defer to PT evaluation       Communication Communication Communication: HOH   Cognition Arousal/Alertness: Awake/alert Behavior During Therapy: WFL for tasks assessed/performed Overall Cognitive Status: No family/caregiver present to determine baseline cognitive functioning  General Comments: Pt is unable to answer details re: how she performs ADLs at home.  She often states "I don't remember, you'll have to ask him".  Daughter does not appear alarmed by this and states "She doesn't remember" but, daughter left prior to OT asking about pt's baseline cognition    General Comments  HR to 130 with activity and 02 sats remained 86-94% on 3L supplemental 02     Exercises Exercises: General Lower Extremity   Shoulder Instructions      Home Living Family/patient expects to be discharged to:: Private residence Living Arrangements: Children Available Help at Discharge: Family;Available 24 hours/day Type of Home: House Home Access: Level entry     Home Layout: One level     Bathroom Shower/Tub: Producer, television/film/video: Handicapped height Bathroom Accessibility: Yes How Accessible: Accessible via walker Home Equipment: Walker - 2 wheels;Walker - 4 wheels;Shower seat;Hand held shower head;Bedside commode   Additional Comments: Pt sleeps in a recliner       Prior Functioning/Environment Level of Independence: Independent with assistive device(s);Needs assistance  Gait / Transfers Assistance Needed: Uses rollator for gait. ADL's / Homemaking Assistance Needed: Pt reports son helps her with LB dressing.  She sponge bathes at the sink in standing.   Son does all of the IADLs    Comments: used rollator for ambulation in home, no driving        OT Problem List: Decreased strength;Decreased activity tolerance;Impaired balance (sitting and/or standing);Decreased cognition;Decreased safety awareness;Decreased knowledge of use of DME or AE;Cardiopulmonary status limiting activity      OT Treatment/Interventions: Self-care/ADL training;Therapeutic exercise;Energy conservation;DME and/or AE instruction;Therapeutic activities;Cognitive remediation/compensation;Patient/family education;Balance training    OT Goals(Current goals can be found in the care plan section) Acute Rehab OT Goals Patient Stated Goal: To return home OT Goal Formulation: With patient Time For Goal Achievement: 04/14/17 Potential to Achieve Goals: Good ADL Goals Pt Will Perform Grooming: with min guard assist;standing Pt Will Perform Upper Body Bathing: with set-up;with supervision;sitting Pt Will Perform Lower Body Bathing: with min guard assist;sit to/from  stand Pt Will Perform Upper Body Dressing: with set-up;sitting Pt Will Perform Lower Body Dressing: with min assist;sit to/from stand Pt Will Perform Toileting - Clothing Manipulation and hygiene: with min guard assist;sit to/from stand  OT Frequency: Min 2X/week   Barriers to D/C:            Co-evaluation              AM-PAC PT "6 Clicks" Daily Activity     Outcome Measure Help from another person eating meals?: None Help from another person taking care of personal grooming?: A Little Help from another person toileting, which includes using toliet, bedpan, or urinal?: A Little Help from another person bathing (including washing, rinsing, drying)?: A Lot Help from another person to put on and taking off regular upper body clothing?: A Little Help from another person to put on and taking off regular lower body clothing?: A Lot 6 Click Score: 17   End of Session Equipment Utilized During Treatment: Rolling walker;Oxygen Nurse Communication: Mobility status  Activity Tolerance: Patient limited by fatigue Patient left: in chair;with call bell/phone within reach  OT Visit Diagnosis: Muscle weakness (generalized) (M62.81)                Time: 1610-9604 OT Time Calculation (min): 25 min Charges:  OT General Charges $OT Visit: 1 Visit OT Evaluation $OT Eval Moderate Complexity: 1 Mod OT Treatments $Self Care/Home  Management : 8-22 mins G-Codes:     Jeani HawkingWendi Lochlin Eppinger, OTR/L [phone]   Jeani HawkingConarpe, Tamari Redwine M 03/31/2017, 6:07 PM

## 2017-03-31 NOTE — Progress Notes (Signed)
Physical Therapy Treatment Patient Details Name: Alexis [name] MRN: [id number] DOB: 1932-01-07 Today's Date: 03/31/2017    History of Present Illness Pt is an 83 y.o. female admitted 03/20/17 with SOB, orthopnea, weight gain, increased O2 requirement.  Patient with CHF exacerbation and UTI.   PMH:  CHF, PAF, DM, neuropathy, COPD on home O2 at 2L, HTN, CKD, anemia, macular degeneration, confusion.     PT Comments    Pt limited by SOB with all activity. Pt functioning at minA/min guard level but has limited activity tolerance due to dec SpO2 into 80s and DOE with all activity. Unsure of prognosis with COPD and CHF. Family discussed appropriateness of rehab however unsure if patient can tolerate it due to quick onset of fatigue and SOB. Acute PT to con't to follow.    Follow Up Recommendations  Home health PT;Supervision for mobility/OOB     Equipment Recommendations       Recommendations for Other Services OT consult(for energy conservation)     Precautions / Restrictions Precautions Precautions: Fall Precaution Comments: SOB with all activity, on 3LO2via [place] Restrictions Weight Bearing Restrictions: No    Mobility  Bed Mobility Overal bed mobility: Needs Assistance Bed Mobility: Supine to Sit     Supine to sit: Min assist;HOB elevated     General bed mobility comments: HOB elevated, pt able to bring LEs off EOB, minA for trunk elevation  Transfers Overall transfer level: Needs assistance Equipment used: Rolling walker (2 wheeled) Transfers: Sit to/from Stand Sit to Stand: Min guard         General transfer comment: v/c's for hand placement, increased time  Ambulation/Gait Ambulation/Gait assistance: Min guard Ambulation Distance (Feet): 50 Feet Assistive device: Rolling walker (2 wheeled) Gait Pattern/deviations: Step-through pattern;Decreased stride length Gait velocity: dec Gait velocity interpretation: Below normal speed for age/gender General Gait  Details: +SOB, SpO2 at 85% on 3LO2 via [place], pt limited by fatigue and SOB   Stairs            Wheelchair Mobility    Modified Rankin (Stroke Patients Only)       Balance Overall balance assessment: Needs assistance Sitting-balance support: No upper extremity supported;Feet supported Sitting balance-Leahy Scale: Good     Standing balance support: Bilateral upper extremity supported Standing balance-Leahy Scale: Poor                              Cognition Arousal/Alertness: Awake/alert Behavior During Therapy: WFL for tasks assessed/performed Overall Cognitive Status: Within Functional Limits for tasks assessed                                 General Comments: pt HOH, need to talk loud      Exercises General Exercises - Lower Extremity Ankle Circles/Pumps: AROM;Both;10 reps;Seated Quad Sets: AROM;Both;10 reps;Seated(with LEs elevated) Gluteal Sets: AROM;Both;10 reps;Seated(with LEs elevated) Long Arc Quad: AROM;Both;15 reps;Seated(with 5 sec hold)    General Comments        Pertinent Vitals/Pain Pain Assessment: No/denies pain    Home Living                      Prior Function            PT Goals (current goals can now be found in the care plan section) Progress towards PT goals: Progressing toward goals    Frequency  Min 3X/week      PT Plan Current plan remains appropriate    Co-evaluation              AM-PAC PT "6 Clicks" Daily Activity  Outcome Measure  Difficulty turning over in bed (including adjusting bedclothes, sheets and blankets)?: A Little Difficulty moving from lying on back to sitting on the side of the bed? : A Lot Difficulty sitting down on and standing up from a chair with arms (e.g., wheelchair, bedside commode, etc,.)?: A Little Help needed moving to and from a bed to chair (including a wheelchair)?: A Little Help needed walking in hospital room?: A Little Help needed climbing 3-5  steps with a railing? : A Lot 6 Click Score: 16    End of Session Equipment Utilized During Treatment: Gait belt;Oxygen Activity Tolerance: Patient limited by fatigue Patient left: in chair;with call bell/phone within reach;with family/visitor present Nurse Communication: Mobility status PT Visit Diagnosis: Unsteadiness on feet (R26.81);Muscle weakness (generalized) (M62.81)     Time: [id number] PT Time Calculation (min) (ACUTE ONLY): 27 min  Charges:  $Gait Training: 8-22 mins $Therapeutic Exercise: 8-22 mins                    G Codes:       Lewis ShockAshly Mindee Robledo, PT, DPT Pager #: [phone] Office #: [phone]    Jullisa Grigoryan M Temitayo Covalt 03/31/2017, 3:02 PM

## 2017-03-31 NOTE — Care Management Important Message (Signed)
Important Message  Patient Details  Name: Alexis [name] MRN: [id number] Date of Birth: 11-20-1931   Medicare Important Message Given:  Yes    Kyla Balzarine 03/31/2017, 10:47 AM

## 2017-04-01 DIAGNOSIS — D631 Anemia in chronic kidney disease: Secondary | ICD-10-CM

## 2017-04-01 DIAGNOSIS — J441 Chronic obstructive pulmonary disease with (acute) exacerbation: Secondary | ICD-10-CM

## 2017-04-01 LAB — CBC
HCT: 22.3 % — ABNORMAL LOW (ref 36.0–46.0)
Hemoglobin: 6.8 g/dL — CL (ref 12.0–15.0)
MCH: 26.5 pg (ref 26.0–34.0)
MCHC: 30.5 g/dL (ref 30.0–36.0)
MCV: 86.8 fL (ref 78.0–100.0)
PLATELETS: 235 10*3/uL (ref 150–400)
RBC: 2.57 MIL/uL — AB (ref 3.87–5.11)
RDW: 16.8 % — ABNORMAL HIGH (ref 11.5–15.5)
WBC: 10.6 10*3/uL — AB (ref 4.0–10.5)

## 2017-04-01 LAB — BASIC METABOLIC PANEL
ANION GAP: 8 (ref 5–15)
BUN: 98 mg/dL — ABNORMAL HIGH (ref 6–20)
CO2: 28 mmol/L (ref 22–32)
Calcium: 8.9 mg/dL (ref 8.9–10.3)
Chloride: 102 mmol/L (ref 101–111)
Creatinine, Ser: 2.94 mg/dL — ABNORMAL HIGH (ref 0.44–1.00)
GFR calc Af Amer: 16 mL/min — ABNORMAL LOW (ref >60)
GFR, EST NON AFRICAN AMERICAN: 14 mL/min — AB (ref >60)
Glucose, Bld: 282 mg/dL — ABNORMAL HIGH (ref 65–99)
POTASSIUM: 4.8 mmol/L (ref 3.5–5.1)
SODIUM: 138 mmol/L (ref 135–145)

## 2017-04-01 LAB — GLUCOSE, CAPILLARY
GLUCOSE-CAPILLARY: 265 mg/dL — AB (ref 65–99)
GLUCOSE-CAPILLARY: 300 mg/dL — AB (ref 65–99)
Glucose-Capillary: 249 mg/dL — ABNORMAL HIGH (ref 65–99)
Glucose-Capillary: 256 mg/dL — ABNORMAL HIGH (ref 65–99)

## 2017-04-01 LAB — PREPARE RBC (CROSSMATCH)

## 2017-04-01 LAB — PROTIME-INR
INR: 4.1
Prothrombin Time: 40.1 seconds — ABNORMAL HIGH (ref 11.4–15.2)

## 2017-04-01 LAB — HEMOGLOBIN AND HEMATOCRIT, BLOOD
HCT: 26 % — ABNORMAL LOW (ref 36.0–46.0)
Hemoglobin: 8.2 g/dL — ABNORMAL LOW (ref 12.0–15.0)

## 2017-04-01 MED ORDER — SODIUM CHLORIDE 0.9 % IV SOLN
Freq: Once | INTRAVENOUS | Status: AC
  Administered 2017-04-01: 14:00:00 via INTRAVENOUS

## 2017-04-01 MED ORDER — PHYTONADIONE 1 MG/0.5 ML ORAL SOLUTION
1.0000 mg | Freq: Once | ORAL | Status: AC
  Administered 2017-04-01: 1 mg via ORAL
  Filled 2017-04-01: qty 0.5

## 2017-04-01 MED ORDER — PREDNISONE 20 MG PO TABS
40.0000 mg | ORAL_TABLET | Freq: Every day | ORAL | Status: DC
  Administered 2017-04-02 – 2017-04-03 (×2): 40 mg via ORAL
  Filled 2017-04-01 (×2): qty 2

## 2017-04-01 MED ORDER — METHYLPREDNISOLONE SODIUM SUCC 125 MG IJ SOLR
60.0000 mg | Freq: Four times a day (QID) | INTRAMUSCULAR | Status: AC
  Administered 2017-04-01: 60 mg via INTRAVENOUS
  Filled 2017-04-01: qty 2

## 2017-04-01 NOTE — Progress Notes (Signed)
ANTICOAGULATION CONSULT NOTE   Pharmacy Consult for Warfarin Indication: atrial fibrillation  Labs: Recent Labs    03/30/17 0245 03/31/17 0431 04/01/17 0647  HGB 7.5* 7.2* 6.8*  HCT 24.8* 23.4* 22.3*  PLT 237 265 235  LABPROT 56.4* 38.4* 40.1*  INR 6.48* 3.96 4.10*  CREATININE 2.62* 2.71* 2.94*    Estimated Creatinine Clearance: 13.4 mL/min (A) (by C-G formula based on SCr of 2.94 mg/dL (H)).  Assessment: 43 YOF on warfarin PTA for AFib - Home dose (as per Coumadin Clinic note on 10/12) was 4mg  daily except 6mg  on Mondays - dose was reduced to that regimen during that appointment d/t INR of 3.6 in the clinic.  INR is up to 4.1 today despite holding anticoagulation and vitamin K. Hgb down to 6.8. No overt bleeding.   Goal of Therapy:  INR 2-3 Monitor platelets by anticoagulation protocol: Yes   Plan:  No warfarin today Daily INR  Lysle Pearl, PharmD, BCPS Phone #: [phone] until 3:30pm All other times, call Main Pharmacy x [phone] 04/01/2017 8:31 AM

## 2017-04-01 NOTE — Progress Notes (Signed)
Inpatient Diabetes Program Recommendations  AACE/ADA: New Consensus Statement on Inpatient Glycemic Control (2015)  Target Ranges:  Prepandial:   less than 140 mg/dL      Peak postprandial:   less than 180 mg/dL (1-2 hours)      Critically ill patients:  140 - 180 mg/dL   Lab Results  Component Value Date   GLUCAP 265 (H) 04/01/2017   HGBA1C 8.8 (H) 03/22/2017    Review of Glycemic Control Results for Alexis [name], Alexis [name] (MRN [id number]) as of 04/01/2017 12:12  Ref. Range 03/31/2017 08:00 03/31/2017 12:10 03/31/2017 16:29 03/31/2017 21:29 04/01/2017 07:50  Glucose-Capillary Latest Ref Range: 65 - 99 mg/dL 383 (H) 291 (H) 916 (H) 262 (H) 265 (H)   Inpatient Diabetes Program Recommendations:   Consider: -Novolog 5 units tid meal coverage if eats 50%  Thank you, Darel Hong E. Carlie Corpus, RN, MSN, CDE  Diabetes Coordinator Inpatient Glycemic Control Team Team Pager [phone] (8am-5pm) 04/01/2017 12:13 PM

## 2017-04-01 NOTE — Progress Notes (Signed)
  Speech Language Pathology Treatment: Dysphagia  Patient Details Name: Alexis [name] MRN: [id number] DOB: 1931-10-25 Today's Date: 04/01/2017 Time: 7989-2119 SLP Time Calculation (min) (ACUTE ONLY): 17 min  Assessment / Plan / Recommendation Clinical Impression  Nursing present when SLP arrived and reported no coughing etc with breakfast or with pills. SLP observed with Dys 2 texture, thin liquids via straw without incident. Therapist reviewed esophageal precautions to stay upright after meals. As SLP documenting outside pt's room heard pt with significant coughing which occurred over the next 10 minutes. Having COPD she likely penetrated food into laryngeal vestibule, fortunately eliciting strong reflexive cough and most likely expelled material. Pt stated "food went the wrong way." She does have dysphagia, COPD which increases her risk however do not feel she needs downgrade to puree. Pt was not short of breath during observation and episode likely incidental. Recommend continue Dys 2, thin, using caution with food items such as scrambled eggs which can be a difficult texture to manage. Continue thin liquids. Will continue to treat.   HPI HPI: [name] a52 y.o.female,with past medical history relevant for diastolic dysfunction CHF, COPD on 2-3 L of oxygen at home, DM, MI admitted with a 2 to three-day history of worsening shortness of breath, orthopnea and increased oxygen requirement.CXR 11/3 findings are similar to the most recent prior exam with bilateral patchy airspace lung opacities. Name opacity at the lung bases is likely due to the development of small pleural effusions. Findings are consistent with either asymmetric pulmonary edema or multifocal pneumonia. MBS 02/28/16 functional swallow, slight oral holding; barium pill hesitated mid esophagus and transited iwth additional puree. Reg/thin recommended.       SLP Plan  Continue with current plan of care        Recommendations  Diet recommendations: Dysphagia 2 (fine chop);Thin liquid Liquids provided via: Cup;Straw Medication Administration: Whole meds with puree Supervision: Patient able to self feed;Intermittent supervision to cue for compensatory strategies Compensations: Slow rate;Small sips/bites Postural Changes and/or Swallow Maneuvers: Seated upright 90 degrees;Upright 30-60 min after meal                Oral Care Recommendations: Oral care BID Follow up Recommendations: None SLP Visit Diagnosis: Dysphagia, unspecified (R13.10) Plan: Continue with current plan of care       GO                Royce Macadamia 04/01/2017, 9:20 AM   Breck Coons Lonell Face.Ed [employer] [phone]

## 2017-04-01 NOTE — Progress Notes (Signed)
CRITICAL VALUE ALERT  Critical Value: Hemoglobin 6.8 and INR 4.10  Date & Time Notied:  04/01/2017 at 0830  Provider Notified: Dr. Irene Limbo  Orders Received/Actions taken: Monitor for low Hgb and give Vit K po

## 2017-04-01 NOTE — Progress Notes (Signed)
Progress Note  Patient Name: Alexis [name] Date of Encounter: 04/01/2017  Primary Cardiologist: Dr [name]  Subjective   Dyspnea improved; no chest pain or palpitations  Inpatient Medications    Scheduled Meds: . atorvastatin  20 mg Oral Daily  . cholecalciferol  1,000 Units Oral q morning - 10a  . escitalopram  10 mg Oral Daily  . feeding supplement (ENSURE ENLIVE)  237 mL Oral BID BM  . fenofibrate  160 mg Oral Daily  . gabapentin  100 mg Oral QHS  . guaiFENesin  600 mg Oral BID  . hydrALAZINE  75 mg Oral BID  . insulin aspart  0-15 Units Subcutaneous TID WC  . insulin aspart  3 Units Subcutaneous TID WC  . insulin detemir  5 Units Subcutaneous Daily  . ipratropium-albuterol  3 mL Nebulization TID  . isosorbide mononitrate  30 mg Oral Daily  . methimazole  5 mg Oral Daily  . methylPREDNISolone (SOLU-MEDROL) injection  60 mg Intravenous Q6H  . metoprolol tartrate  12.5 mg Oral BID  . multivitamin with minerals  1 tablet Oral Daily  . polyethylene glycol  17 g Oral BID  . senna  1 tablet Oral QHS  . sodium chloride flush  3 mL Intravenous Q12H  . Warfarin - Pharmacist Dosing Inpatient   Does not apply q1800   Continuous Infusions: . sodium chloride Stopped (03/29/17 0150)  . ceFEPime (MAXIPIME) IV Stopped (03/31/17 1602)   PRN Meds: sodium chloride, acetaminophen **OR** acetaminophen, albuterol, ondansetron **OR** ondansetron (ZOFRAN) IV, sodium chloride flush, traZODone   Vital Signs    Vitals:   03/31/17 2243 04/01/17 0027 04/01/17 0327 04/01/17 0700  BP:  (!) 120/52 (!) 131/58 (!) 143/67  Pulse: 89 77 70 87  Resp: 15 15 16 16   Temp:  98.1 F (36.7 C) 97.9 F (36.6 C) 97.8 F (36.6 C)  TempSrc:  Oral Oral Oral  SpO2: 97% 98% 99% 99%  Weight:   157 lb 12.8 oz (71.6 kg)   Height:        Intake/Output Summary (Last 24 hours) at 04/01/2017 0800 Last data filed at 04/01/2017 0700 Gross per 24 hour  Intake 606 ml  Output 2002 ml  Net -1396 ml    Filed Weights   03/30/17 0437 03/31/17 0427 04/01/17 0327  Weight: 160 lb 8 oz (72.8 kg) 159 lb (72.1 kg) 157 lb 12.8 oz (71.6 kg)    Telemetry    Atrial fibrillation with PVCs- Personally Reviewed   Physical Exam   GEN: WD frail No acute distress.   Neck: supple, no JVD HEENT: normal Cardiac: irregular Respiratory: Mildly diminished BS bases; no wheeze GI: NT/ND, no masses MS: No edema Neuro:  No focal findings   Labs    Chemistry Recent Labs  Lab 03/30/17 0245 03/31/17 0431 04/01/17 0647  NA 139 140 138  K 4.0 3.9 4.8  CL 102 103 102  CO2 27 27 28   GLUCOSE 242* 206* 282*  BUN 62* 77* 98*  CREATININE 2.62* 2.71* 2.94*  CALCIUM 8.7* 8.9 8.9  GFRNONAA 16* 15* 14*  GFRAA 18* 18* 16*  ANIONGAP 10 10 8      Hematology Recent Labs  Lab 03/27/17 0537 03/30/17 0245 03/31/17 0431  WBC 8.3 8.0 12.4*  RBC 2.89* 2.82* 2.68*  HGB 7.8* 7.5* 7.2*  HCT 26.0* 24.8* 23.4*  MCV 90.0 87.9 87.3  MCH 27.0 26.6 26.9  MCHC 30.0 30.2 30.8  RDW 16.1* 16.2* 16.7*  PLT 221 237 265  Cardiac Studies   Echocardiogram 03/21/17: - Left ventricle: The cavity size was normal. Wall thickness was   normal. Systolic function was vigorous. The estimated ejection   fraction was in the range of 65% to 70%. Wall motion was normal;   there were no regional wall motion abnormalities. Features are   consistent with a pseudonormal left ventricular filling pattern,   with concomitant abnormal relaxation and increased filling   pressure (grade 2 diastolic dysfunction). - Aortic valve: Trileaflet; moderately thickened, moderately   calcified leaflets. There was mild stenosis. Peak velocity (S):   276 cm/s. Mean gradient (S): 15 mm Hg. Valve area (VTI): 1.69   cm^2. Valve area (Vmax): 1.76 cm^2. Valve area (Vmean): 1.64   cm^2. - Mitral valve: Moderately calcified annulus. Mildly thickened   leaflets . There was moderate regurgitation. - Left atrium: The atrium was moderately to  severely dilated.   Volume/bsa, S: 49.5 ml/m^2. - Right atrium: The atrium was moderately dilated. - Tricuspid valve: There was moderate regurgitation. - Pulmonary arteries: Systolic pressure was severely increased. PA   peak pressure: 70 mm Hg (S).  Impressions:  - Compared to the prior study, there has been no significant   interval change.  Patient Profile     83 y.o. female admitted with acute on chronic diastolic heart failure, increased shortness of breath, fatigue  Assessment & Plan    Acute on chronic diastolic heart failure - Pt appears to be euvolemic; more prerenal; would hold on further diuresis.  COPD exacerbation - Management per IM  Paroxysmal atrial fibrillation -CHADSVASc score 6.   -Back in atrial fibrillation this AM; Continue coumadin; continue metoprolol ( rate is controlled).  Mild aortic stenosis/Moderate MR -conservative measures given overall medical condition.  For questions or updates, please contact [facility] Please consult [email or web address] for contact info under Cardiology/STEMI.      Signed, Olga MillersBrian Crenshaw, MD  04/01/2017, 8:00 AM

## 2017-04-01 NOTE — Progress Notes (Signed)
Physical Therapy Treatment Patient Details Name: Alexis GivensChristine E [name] MRN: [id number] DOB: 05/21/1932 Today's Date: 04/01/2017    History of Present Illness Pt is an 83 y.o. female admitted 03/20/17 with SOB, orthopnea, weight gain, increased O2 requirement.  Patient with CHF exacerbation and UTI.   PMH:  CHF, PAF, DM, neuropathy, COPD on home O2 at 2L, HTN, CKD, anemia, macular degeneration, confusion.     PT Comments    Improving slowly, limited mostly by dropping saturations during gait.   Follow Up Recommendations  Home health PT;Supervision for mobility/OOB     Equipment Recommendations  Other (comment)    Recommendations for Other Services       Precautions / Restrictions Precautions Precautions: Fall    Mobility  Bed Mobility                  Transfers Overall transfer level: Needs assistance Equipment used: Rolling walker (2 wheeled) Transfers: Sit to/from Stand Sit to Stand: Min guard         General transfer comment: v/c's for hand placement, increased time  Ambulation/Gait Ambulation/Gait assistance: Min guard;Min assist(min to help maneuver RW) Ambulation Distance (Feet): 120 Feet(with a standing rest break) Assistive device: Rolling walker (2 wheeled) Gait Pattern/deviations: Step-through pattern;Decreased step length - right;Decreased step length - left Gait velocity: dec Gait velocity interpretation: Below normal speed for age/gender General Gait Details: SpO2 dropped to 84% on 3L,incr to 4L during standing to get sats to low 90's..  Gait generally steady until fatigue then mildly staggery needing minimal assist for stability and more assist with RW   Stairs            Wheelchair Mobility    Modified Rankin (Stroke Patients Only)       Balance Overall balance assessment: Needs assistance Sitting-balance support: No upper extremity supported;Feet supported Sitting balance-Leahy Scale: Good       Standing balance-Leahy Scale:  Poor Standing balance comment: reliant on UE support                             Cognition Arousal/Alertness: Awake/alert Behavior During Therapy: WFL for tasks assessed/performed Overall Cognitive Status: Within Functional Limits for tasks assessed(but defers questions to son)                                        Exercises      General Comments        Pertinent Vitals/Pain Pain Assessment: Faces    Home Living                      Prior Function            PT Goals (current goals can now be found in the care plan section) Acute Rehab PT Goals Patient Stated Goal: To return home PT Goal Formulation: With patient Time For Goal Achievement: 04/04/17 Potential to Achieve Goals: Good Progress towards PT goals: Progressing toward goals    Frequency    Min 3X/week      PT Plan Current plan remains appropriate    Co-evaluation              AM-PAC PT "6 Clicks" Daily Activity  Outcome Measure  Difficulty turning over in bed (including adjusting bedclothes, sheets and blankets)?: A Little Difficulty moving from lying on back to sitting on  the side of the bed? : A Lot Difficulty sitting down on and standing up from a chair with arms (e.g., wheelchair, bedside commode, etc,.)?: A Little Help needed moving to and from a bed to chair (including a wheelchair)?: A Little Help needed walking in hospital room?: A Little Help needed climbing 3-5 steps with a railing? : A Lot 6 Click Score: 16    End of Session Equipment Utilized During Treatment: Oxygen Activity Tolerance: Patient limited by fatigue Patient left: in chair;with call bell/phone within reach;with family/visitor present Nurse Communication: Mobility status PT Visit Diagnosis: Unsteadiness on feet (R26.81);Muscle weakness (generalized) (M62.81)     Time: 3403-5248 PT Time Calculation (min) (ACUTE ONLY): 26 min  Charges:  $Gait Training: 8-22 mins $Therapeutic  Activity: 8-22 mins                    G Codes:       03/27/2017  Grinnell Bing, PT [phone]  (pager)   Alexis [name] 04/05/2017, 1:02 PM

## 2017-04-01 NOTE — Progress Notes (Signed)
PROGRESS NOTE  Alexis [name] EAV:[id number] DOB: 06/09/1931 DOA: 03/20/2017 PCP: Ladora DanielBeal, [name], PA-C  Brief Narrative: 83 year old woman PMH diastolic heart failure, oxygen dependent COPD presented with increasing shortness of breath, orthopnea and worsening hypoxia.  Admitted for acute on chronic diastolic congestive heart failure, acute on chronic hypoxic respiratory failure. Gradually improved with diuresis but developed AKI prolonging hospitalization. Renal function peaked but then developed acute COPD exacerbation, possible pneumonia and recurrent CHF necessitating BiPAP and transfer to SDU. Condition now improving, COPD stabilizing and renal function appears stable. Anticipate d/c home in 48 hours if continues to improve.  Assessment/Plan Acute on chronic hypoxic respiratory failure; COPD exacerbation; possible pneumonia, multifocal. - dramatically improved today - continue steroids, bronchodilators, abx 11/6 and BiPAP QHS   Acute on chronic diastolic congestive heart failure - appears compensated at this point. No peripheral edema.  - consider resuming torsemide 11/7  AKI superimposed on CKD stage IV. Renal u/s showed medical renal disease. U/A showed asymptomatic bacturia - overall remains stable. May be at new baseline or may return to previously baseline over the next 1-2 weeks  Anemia of chronic kidney disease with anemia of acute illness - slightly lower Hgb today. Discussed rec for 1 unit PRBC with patient and family. They will consider this but do not yet assent. Hemodynamics stable, no bleeding.  Diabetes mellitus type 2. Hgb A1c 8.8. - remains stable, continue Levemir 11/6  Atrial fibrillation, CHA2DS2-VASc 6. - INR back up secondary to abx. No bleeding but given anemia and trend back up will give another dose of oral vitamin K.  - INR in AM  E coli UTI -treated with abx  Severe pulmonary HTN -presumably from second-hand smoke exposure.  Dysphagia 2 (fine  chop);Thin liquid per ST   Overall improving. Change to oral abx. Transfer to med-surg. Hopefully home 48 hours  DVT prophylaxis: warfarin Code Status: DNR Family Communication: daughter by telephone 11/6 Disposition Plan: HH PT, OT   Brendia Sacksaniel Karthika Glasper, MD  [facility] Direct contact: [phone] --Via amion app OR  --[email or web address]; password [email or web address]  7PM-7AM contact night coverage as above 04/01/2017, 10:22 AM  LOS: 12 days   Consultants:  Cardiology   Procedures:  Echo Study Conclusions  - Left ventricle: The cavity size was normal. Wall thickness was   normal. Systolic function was vigorous. The estimated ejection   fraction was in the range of 65% to 70%. Wall motion was normal;   there were no regional wall motion abnormalities. Features are   consistent with a pseudonormal left ventricular filling pattern,   with concomitant abnormal relaxation and increased filling   pressure (grade 2 diastolic dysfunction). - Aortic valve: Trileaflet; moderately thickened, moderately   calcified leaflets. There was mild stenosis. Peak velocity (S):   276 cm/s. Mean gradient (S): 15 mm Hg. Valve area (VTI): 1.69   cm^2. Valve area (Vmax): 1.76 cm^2. Valve area (Vmean): 1.64   cm^2. - Mitral valve: Moderately calcified annulus. Mildly thickened   leaflets . There was moderate regurgitation. - Left atrium: The atrium was moderately to severely dilated.   Volume/bsa, S: 49.5 ml/m^2. - Right atrium: The atrium was moderately dilated. - Tricuspid valve: There was moderate regurgitation. - Pulmonary arteries: Systolic pressure was severely increased. PA   peak pressure: 70 mm Hg (S).  Impressions:  - Compared to the prior study, there has been no significant   interval change.  Antimicrobials:  Vancomycin 11/3   Cefepime 11/3 >>  Interval history/Subjective: Feels fine, no complaints. Breathing  well. No pain.  Objective: Vitals:   Vitals:   04/01/17 0841 04/01/17  0923  BP: (!) 143/67   Pulse: 90   Resp:    Temp:    SpO2:  93%    Exam:  Constitutional:   . Appears calm and comfortable sitting in chair Eyes:  . pupils and irises appear normal ENMT:  . Hard of hearing  Respiratory:  . Few rhonchi and crackles, good air movement, normal duration of expiration . Respiratory effort normal.  Cardiovascular:  . irregular, no m/r/g . No LE extremity edema   Psychiatric:  . Mental status o Mood, affect appropriate  I have personally reviewed the following:   UOP: 2002 I/O: -350 Last BM: 11/1 Foley: no Tele: afib  Labs:  CBG 200-300s  Creatinine 3.07 >> 2.86 >> 2.62 >> 2.71 >> 2.94  Hgb stable 7.8 >> 7.5 >> 7.2 >> 6.8  INR 2.71 >> 3.5 >> 6.48 >> 3.96 >> 4.1  Imaging studies:     Scheduled Meds: . atorvastatin  20 mg Oral Daily  . cholecalciferol  1,000 Units Oral q morning - 10a  . escitalopram  10 mg Oral Daily  . feeding supplement (ENSURE ENLIVE)  237 mL Oral BID BM  . fenofibrate  160 mg Oral Daily  . gabapentin  100 mg Oral QHS  . guaiFENesin  600 mg Oral BID  . hydrALAZINE  75 mg Oral BID  . insulin aspart  0-15 Units Subcutaneous TID WC  . insulin aspart  3 Units Subcutaneous TID WC  . insulin detemir  5 Units Subcutaneous Daily  . ipratropium-albuterol  3 mL Nebulization TID  . isosorbide mononitrate  30 mg Oral Daily  . methimazole  5 mg Oral Daily  . methylPREDNISolone (SOLU-MEDROL) injection  60 mg Intravenous Q6H  . metoprolol tartrate  12.5 mg Oral BID  . multivitamin with minerals  1 tablet Oral Daily  . polyethylene glycol  17 g Oral BID  . senna  1 tablet Oral QHS  . sodium chloride flush  3 mL Intravenous Q12H  . Warfarin - Pharmacist Dosing Inpatient   Does not apply q1800   Continuous Infusions: . sodium chloride Stopped (03/29/17 0150)  . ceFEPime (MAXIPIME) IV Stopped (03/31/17 1602)    Principal Problem:   COPD with acute exacerbation (HCC) Active Problems:   COPD (chronic obstructive  pulmonary disease) (HCC)   Paroxysmal atrial fibrillation (HCC)   Long term current use of anticoagulant therapy   Chronic respiratory failure with hypoxia (HCC)   Type II diabetes mellitus (HCC)   AKI (acute kidney injury) (HCC)   CKD (chronic kidney disease), stage IV (HCC)   Acute on chronic diastolic CHF (congestive heart failure) (HCC)   Severe pulmonary arterial systolic hypertension (HCC)   Acute on chronic respiratory failure with hypoxia (HCC)   LOS: 12 days

## 2017-04-02 ENCOUNTER — Inpatient Hospital Stay (HOSPITAL_COMMUNITY): Payer: Medicare Other

## 2017-04-02 LAB — BPAM RBC
Blood Product Expiration Date: 201812042359
ISSUE DATE / TIME: 201811061338
Unit Type and Rh: 5100

## 2017-04-02 LAB — CBC
HEMATOCRIT: 25.3 % — AB (ref 36.0–46.0)
Hemoglobin: 7.8 g/dL — ABNORMAL LOW (ref 12.0–15.0)
MCH: 27 pg (ref 26.0–34.0)
MCHC: 30.8 g/dL (ref 30.0–36.0)
MCV: 87.5 fL (ref 78.0–100.0)
Platelets: 235 10*3/uL (ref 150–400)
RBC: 2.89 MIL/uL — ABNORMAL LOW (ref 3.87–5.11)
RDW: 16.1 % — ABNORMAL HIGH (ref 11.5–15.5)
WBC: 8.4 10*3/uL (ref 4.0–10.5)

## 2017-04-02 LAB — TYPE AND SCREEN
ABO/RH(D): O POS
ANTIBODY SCREEN: NEGATIVE
Unit division: 0

## 2017-04-02 LAB — BASIC METABOLIC PANEL
Anion gap: 9 (ref 5–15)
BUN: 113 mg/dL — AB (ref 6–20)
CHLORIDE: 100 mmol/L — AB (ref 101–111)
CO2: 27 mmol/L (ref 22–32)
CREATININE: 3.02 mg/dL — AB (ref 0.44–1.00)
Calcium: 8.7 mg/dL — ABNORMAL LOW (ref 8.9–10.3)
GFR calc Af Amer: 15 mL/min — ABNORMAL LOW (ref >60)
GFR calc non Af Amer: 13 mL/min — ABNORMAL LOW (ref >60)
Glucose, Bld: 292 mg/dL — ABNORMAL HIGH (ref 65–99)
Potassium: 5.3 mmol/L — ABNORMAL HIGH (ref 3.5–5.1)
Sodium: 136 mmol/L (ref 135–145)

## 2017-04-02 LAB — GLUCOSE, CAPILLARY
Glucose-Capillary: 236 mg/dL — ABNORMAL HIGH (ref 65–99)
Glucose-Capillary: 276 mg/dL — ABNORMAL HIGH (ref 65–99)
Glucose-Capillary: 286 mg/dL — ABNORMAL HIGH (ref 65–99)
Glucose-Capillary: 194 mg/dL — ABNORMAL HIGH (ref 65–99)
Glucose-Capillary: 194 mg/dL — ABNORMAL HIGH (ref 65–99)

## 2017-04-02 LAB — PROTIME-INR
INR: 2.32
Prothrombin Time: 25.3 seconds — ABNORMAL HIGH (ref 11.4–15.2)

## 2017-04-02 MED ORDER — INSULIN ASPART 100 UNIT/ML ~~LOC~~ SOLN
5.0000 [IU] | Freq: Three times a day (TID) | SUBCUTANEOUS | Status: DC
  Administered 2017-04-02 – 2017-04-08 (×17): 5 [IU] via SUBCUTANEOUS

## 2017-04-02 MED ORDER — WARFARIN SODIUM 4 MG PO TABS
4.0000 mg | ORAL_TABLET | Freq: Once | ORAL | Status: AC
  Administered 2017-04-02: 4 mg via ORAL
  Filled 2017-04-02: qty 1

## 2017-04-02 NOTE — Plan of Care (Signed)
Pt received dose of Miralax tonight

## 2017-04-02 NOTE — Progress Notes (Signed)
PROGRESS NOTE    Alexis [name]  ZOX:[id number] DOB: 1931/07/02 DOA: 03/20/2017 PCP: [name], PA-C  Brief Narrative: 83 year old woman PMH diastolic heart failure, oxygen dependent COPD presented with increasing shortness of breath, orthopnea and worsening hypoxia.  Admitted for acute on chronic diastolic congestive heart failure, acute on chronic hypoxic respiratory failure. Gradually improved with diuresis but developed AKI prolonging hospitalization. Renal function peaked but then developed acute COPD exacerbation, possible pneumonia and recurrent CHF necessitating BiPAP and transfer to SDU. Condition now improving, COPD stabilizing and renal function appears stable.  Patient resting in bed today.denies any complaints of chest pain shortness of breath.reviewed cardiology notes.   Assessment & Plan:   Principal Problem:   COPD with acute exacerbation (HCC) Active Problems:   COPD (chronic obstructive pulmonary disease) (HCC)   Paroxysmal atrial fibrillation (HCC)   Long term current use of anticoagulant therapy   Chronic respiratory failure with hypoxia (HCC)   Type II diabetes mellitus (HCC)   AKI (acute kidney injury) (HCC)   CKD (chronic kidney disease), stage IV (HCC)   Acute on chronic diastolic CHF (congestive heart failure) (HCC)   Severe pulmonary arterial systolic hypertension (HCC)   Acute on chronic respiratory failure with hypoxia (HCC)   Acute on chronic hypoxic respiratory failure due to pneumonia/copd/chf-taper steroids.repeat chest xray today.complete course of antibiotics.will hold off on resuming torsemide.she does not appear to be in fluid overload at this time.  aki on ckd-renal functions worsened with diuretics.follow levels.  Type 2 dm add insulin before meals.  afib continue coumadin.   DVT prophylaxis: coumadin Code Status:dnr Family Communication:none Disposition Plan:possible home tomorrow.  Consultants:  cards  Procedures:none Antimicrobials:augmentin  Subjective: No complaints  Objective: Vitals:   04/02/17 0844 04/02/17 0954 04/02/17 1016 04/02/17 1401  BP: 111/84 118/61  (!) 120/55  Pulse: 75 69  85  Resp: 14 14  20   Temp:    98.4 F (36.9 C)  TempSrc:    Oral  SpO2: 99% 93% 97% 96%  Weight:      Height:        Intake/Output Summary (Last 24 hours) at 04/02/2017 1515 Last data filed at 04/02/2017 1400 Gross per 24 hour  Intake 1255 ml  Output 1425 ml  Net -170 ml   Filed Weights   03/31/17 0427 04/01/17 0327 04/02/17 0517  Weight: 72.1 kg (159 lb) 71.6 kg (157 lb 12.8 oz) 70.8 kg (156 lb)    Examination:  General exam: Appears calm and comfortable  Respiratory system: Clear to auscultation. Respiratory effort normal. Cardiovascular system: S1 & S2 heard, RRR. No JVD, murmurs, rubs, gallops or clicks. No pedal edema. Gastrointestinal system: Abdomen is nondistended, soft and nontender. No organomegaly or masses felt. Normal bowel sounds heard. Central nervous system: Alert and oriented. No focal neurological deficits. Extremities: Symmetric 5 x 5 power. Skin: No rashes, lesions or ulcers Psychiatry: Judgement and insight appear normal. Mood & affect appropriate.     Data Reviewed: I have personally reviewed following labs and imaging studies  CBC: Recent Labs  Lab 03/27/17 0537 03/30/17 0245 03/31/17 0431 04/01/17 0647 04/01/17 1901 04/02/17 0333  WBC 8.3 8.0 12.4* 10.6*  --  8.4  HGB 7.8* 7.5* 7.2* 6.8* 8.2* 7.8*  HCT 26.0* 24.8* 23.4* 22.3* 26.0* 25.3*  MCV 90.0 87.9 87.3 86.8  --  87.5  PLT 221 237 265 235  --  235   Basic Metabolic Panel: Recent Labs  Lab 03/29/17 1021 03/30/17 0245 03/31/17 0431 04/01/17 0647 04/02/17  0333  NA 138 139 140 138 136  K 4.3 4.0 3.9 4.8 5.3*  CL 106 102 103 102 100*  CO2 23 27 27 28 27   GLUCOSE 289* 242* 206* 282* 292*  BUN 56* 62* 77* 98* 113*  CREATININE 2.86* 2.62* 2.71* 2.94* 3.02*  CALCIUM 8.1* 8.7*  8.9 8.9 8.7*   GFR: Estimated Creatinine Clearance: 13 mL/min (A) (by C-G formula based on SCr of 3.02 mg/dL (H)). Liver Function Tests: No results for input(s): AST, ALT, ALKPHOS, BILITOT, PROT, ALBUMIN in the last 168 hours. No results for input(s): LIPASE, AMYLASE in the last 168 hours. No results for input(s): AMMONIA in the last 168 hours. Coagulation Profile: Recent Labs  Lab 03/29/17 0521 03/30/17 0245 03/31/17 0431 04/01/17 0647 04/02/17 0333  INR 3.50 6.48* 3.96 4.10* 2.32   Cardiac Enzymes: No results for input(s): CKTOTAL, CKMB, CKMBINDEX, TROPONINI in the last 168 hours. BNP (last 3 results) No results for input(s): PROBNP in the last 8760 hours. HbA1C: No results for input(s): HGBA1C in the last 72 hours. CBG: Recent Labs  Lab 04/01/17 1209 04/01/17 1652 04/01/17 2136 04/02/17 0823 04/02/17 1110  GLUCAP 300* 249* 256* 236* 276*   Lipid Profile: No results for input(s): CHOL, HDL, LDLCALC, TRIG, CHOLHDL, LDLDIRECT in the last 72 hours. Thyroid Function Tests: No results for input(s): TSH, T4TOTAL, FREET4, T3FREE, THYROIDAB in the last 72 hours. Anemia Panel: No results for input(s): VITAMINB12, FOLATE, FERRITIN, TIBC, IRON, RETICCTPCT in the last 72 hours. Sepsis Labs: Recent Labs  Lab 03/29/17 1455  LATICACIDVEN 1.2    Recent Results (from the past 240 hour(s))  Culture, blood (routine x 2) Call MD if unable to obtain prior to antibiotics being given     Status: None (Preliminary result)   Collection Time: 03/29/17  2:55 PM  Result Value Ref Range Status   Specimen Description BLOOD LEFT ANTECUBITAL  Final   Special Requests IN PEDIATRIC BOTTLE Blood Culture adequate volume  Final   Culture NO GROWTH 4 DAYS  Final   Report Status PENDING  Incomplete  Culture, blood (routine x 2) Call MD if unable to obtain prior to antibiotics being given     Status: None (Preliminary result)   Collection Time: 03/29/17  3:02 PM  Result Value Ref Range Status    Specimen Description BLOOD LEFT HAND  Final   Special Requests   Final    BOTTLES DRAWN AEROBIC ONLY Blood Culture adequate volume   Culture NO GROWTH 4 DAYS  Final   Report Status PENDING  Incomplete  MRSA PCR Screening     Status: None   Collection Time: 03/30/17  7:57 AM  Result Value Ref Range Status   MRSA by PCR NEGATIVE NEGATIVE Final    Comment:        The GeneXpert MRSA Assay (FDA approved for NASAL specimens only), is one component of a comprehensive MRSA colonization surveillance program. It is not intended to diagnose MRSA infection nor to guide or monitor treatment for MRSA infections.          Radiology Studies: No results found.      Scheduled Meds: . atorvastatin  20 mg Oral Daily  . cholecalciferol  1,000 Units Oral q morning - 10a  . escitalopram  10 mg Oral Daily  . feeding supplement (ENSURE ENLIVE)  237 mL Oral BID BM  . fenofibrate  160 mg Oral Daily  . gabapentin  100 mg Oral QHS  . guaiFENesin  600 mg Oral BID  .  hydrALAZINE  75 mg Oral BID  . insulin aspart  0-15 Units Subcutaneous TID WC  . insulin aspart  3 Units Subcutaneous TID WC  . insulin detemir  5 Units Subcutaneous Daily  . ipratropium-albuterol  3 mL Nebulization TID  . isosorbide mononitrate  30 mg Oral Daily  . methimazole  5 mg Oral Daily  . metoprolol tartrate  12.5 mg Oral BID  . multivitamin with minerals  1 tablet Oral Daily  . polyethylene glycol  17 g Oral BID  . predniSONE  40 mg Oral Q breakfast  . senna  1 tablet Oral QHS  . sodium chloride flush  3 mL Intravenous Q12H  . warfarin  4 mg Oral ONCE-1800  . Warfarin - Pharmacist Dosing Inpatient   Does not apply q1800   Continuous Infusions: . sodium chloride Stopped (03/29/17 0150)  . ceFEPime (MAXIPIME) IV Stopped (04/01/17 1745)     LOS: 13 days      Alwyn RenElizabeth G Sharday Michl, MD [facility]  If 7PM-7AM, please contact night-coverage [email or web address] Password [email or web address] 04/02/2017, 3:15 PM

## 2017-04-02 NOTE — Progress Notes (Signed)
ANTICOAGULATION CONSULT NOTE   Pharmacy Consult for Warfarin Indication: atrial fibrillation  Labs: Recent Labs    03/31/17 0431 04/01/17 0647 04/01/17 1901 04/02/17 0333  HGB 7.2* 6.8* 8.2* 7.8*  HCT 23.4* 22.3* 26.0* 25.3*  PLT 265 235  --  235  LABPROT 38.4* 40.1*  --  25.3*  INR 3.96 4.10*  --  2.32  CREATININE 2.71* 2.94*  --  3.02*    Estimated Creatinine Clearance: 13 mL/min (A) (by C-G formula based on SCr of 3.02 mg/dL (H)).  Assessment: 1 YOF on warfarin PTA for AFib - Home dose (as per [facility]in [facility] note on 10/12) was 4mg  daily except 6mg  on Mondays - dose was reduced to that regimen during that appointment d/t INR of 3.6 in the clinic.  INR is now therapeutic at 2.32 after a small dose of oral vitamin K. Hgb improved to 7.8. No bleeding noted.   Goal of Therapy:  INR 2-3 Monitor platelets by anticoagulation protocol: Yes   Plan:  Warfarin 4mg  PO x 1 tonight Daily INR  Lysle Pearl, PharmD, BCPS Phone #: [phone] until 3:30pm All other times, call Main Pharmacy x [phone] 04/02/2017 8:36 AM

## 2017-04-02 NOTE — Progress Notes (Signed)
Progress Note  Patient Name: Alexis [name] Date of Encounter: 04/02/2017  Primary Cardiologist: Dr [name]  Subjective   Dyspnea resolved; no CP  Inpatient Medications    Scheduled Meds: . atorvastatin  20 mg Oral Daily  . cholecalciferol  1,000 Units Oral q morning - 10a  . escitalopram  10 mg Oral Daily  . feeding supplement (ENSURE ENLIVE)  237 mL Oral BID BM  . fenofibrate  160 mg Oral Daily  . gabapentin  100 mg Oral QHS  . guaiFENesin  600 mg Oral BID  . hydrALAZINE  75 mg Oral BID  . insulin aspart  0-15 Units Subcutaneous TID WC  . insulin aspart  3 Units Subcutaneous TID WC  . insulin detemir  5 Units Subcutaneous Daily  . ipratropium-albuterol  3 mL Nebulization TID  . isosorbide mononitrate  30 mg Oral Daily  . methimazole  5 mg Oral Daily  . metoprolol tartrate  12.5 mg Oral BID  . multivitamin with minerals  1 tablet Oral Daily  . polyethylene glycol  17 g Oral BID  . predniSONE  40 mg Oral Q breakfast  . senna  1 tablet Oral QHS  . sodium chloride flush  3 mL Intravenous Q12H  . Warfarin - Pharmacist Dosing Inpatient   Does not apply q1800   Continuous Infusions: . sodium chloride Stopped (03/29/17 0150)  . ceFEPime (MAXIPIME) IV Stopped (04/01/17 1745)   PRN Meds: sodium chloride, acetaminophen **OR** acetaminophen, albuterol, ondansetron **OR** ondansetron (ZOFRAN) IV, sodium chloride flush, traZODone   Vital Signs    Vitals:   04/01/17 2214 04/01/17 2321 04/02/17 0241 04/02/17 0517  BP: (!) 153/67 (!) 131/55  117/61  Pulse: 77 74 70 68  Resp: 13 11 12 13   Temp:  97.9 F (36.6 C)  98 F (36.7 C)  TempSrc:  Oral  Axillary  SpO2: 98% 98% 97% 97%  Weight:    156 lb (70.8 kg)  Height:        Intake/Output Summary (Last 24 hours) at 04/02/2017 0835 Last data filed at 04/02/2017 0500 Gross per 24 hour  Intake 895 ml  Output 1125 ml  Net -230 ml   Filed Weights   03/31/17 0427 04/01/17 0327 04/02/17 0517  Weight: 159 lb (72.1 kg) 157  lb 12.8 oz (71.6 kg) 156 lb (70.8 kg)    Telemetry    Atrial fibrillation rate controlled- Personally Reviewed   Physical Exam   GEN: WD NAD Neck: supple HEENT: normal, normal eyelids Cardiac: irregular with 2/6 SM Respiratory: CTA GI: NT, soft MS: 1+ thigh edema Neuro:  grossly intact   Labs    Chemistry Recent Labs  Lab 03/31/17 0431 04/01/17 0647 04/02/17 0333  NA 140 138 136  K 3.9 4.8 5.3*  CL 103 102 100*  CO2 27 28 27   GLUCOSE 206* 282* 292*  BUN 77* 98* 113*  CREATININE 2.71* 2.94* 3.02*  CALCIUM 8.9 8.9 8.7*  GFRNONAA 15* 14* 13*  GFRAA 18* 16* 15*  ANIONGAP 10 8 9      Hematology Recent Labs  Lab 03/31/17 0431 04/01/17 0647 04/01/17 1901 04/02/17 0333  WBC 12.4* 10.6*  --  8.4  RBC 2.68* 2.57*  --  2.89*  HGB 7.2* 6.8* 8.2* 7.8*  HCT 23.4* 22.3* 26.0* 25.3*  MCV 87.3 86.8  --  87.5  MCH 26.9 26.5  --  27.0  MCHC 30.8 30.5  --  30.8  RDW 16.7* 16.8*  --  16.1*  PLT 265  235  --  235     Cardiac Studies   Echocardiogram 03/21/17: - Left ventricle: The cavity size was normal. Wall thickness was   normal. Systolic function was vigorous. The estimated ejection   fraction was in the range of 65% to 70%. Wall motion was normal;   there were no regional wall motion abnormalities. Features are   consistent with a pseudonormal left ventricular filling pattern,   with concomitant abnormal relaxation and increased filling   pressure (grade 2 diastolic dysfunction). - Aortic valve: Trileaflet; moderately thickened, moderately   calcified leaflets. There was mild stenosis. Peak velocity (S):   276 cm/s. Mean gradient (S): 15 mm Hg. Valve area (VTI): 1.69   cm^2. Valve area (Vmax): 1.76 cm^2. Valve area (Vmean): 1.64   cm^2. - Mitral valve: Moderately calcified annulus. Mildly thickened   leaflets . There was moderate regurgitation. - Left atrium: The atrium was moderately to severely dilated.   Volume/bsa, S: 49.5 ml/m^2. - Right atrium: The  atrium was moderately dilated. - Tricuspid valve: There was moderate regurgitation. - Pulmonary arteries: Systolic pressure was severely increased. PA   peak pressure: 70 mm Hg (S).  Impressions:  - Compared to the prior study, there has been no significant   interval change.  Patient Profile     83 y.o. female admitted with acute on chronic diastolic heart failure, increased shortness of breath, fatigue  Assessment & Plan    Acute on chronic diastolic heart failure -more prerenal; would hold diuretics for now  COPD exacerbation - Much improved; meds per IM.  Paroxysmal atrial fibrillation -CHADSVASc score 6.   -Remains in atrial fibrillation this AM; Continue coumadin; continue metoprolol for rate control.  Mild aortic stenosis/Moderate MR -Fu echos in the future but would like to be conservative given age and overall medical condition.  For questions or updates, please contact [facility] Please consult [email or web address] for contact info under Cardiology/STEMI.      Signed, Olga MillersBrian Lakin Rhine, MD  04/02/2017, 8:35 AM

## 2017-04-03 LAB — BASIC METABOLIC PANEL
Anion gap: 9 (ref 5–15)
BUN: 123 mg/dL — AB (ref 6–20)
CHLORIDE: 100 mmol/L — AB (ref 101–111)
CO2: 27 mmol/L (ref 22–32)
CREATININE: 2.89 mg/dL — AB (ref 0.44–1.00)
Calcium: 8.6 mg/dL — ABNORMAL LOW (ref 8.9–10.3)
GFR calc Af Amer: 16 mL/min — ABNORMAL LOW (ref >60)
GFR calc non Af Amer: 14 mL/min — ABNORMAL LOW (ref >60)
GLUCOSE: 179 mg/dL — AB (ref 65–99)
Potassium: 5.4 mmol/L — ABNORMAL HIGH (ref 3.5–5.1)
SODIUM: 136 mmol/L (ref 135–145)

## 2017-04-03 LAB — CULTURE, BLOOD (ROUTINE X 2)
Culture: NO GROWTH
Culture: NO GROWTH
SPECIAL REQUESTS: ADEQUATE
Special Requests: ADEQUATE

## 2017-04-03 LAB — GLUCOSE, CAPILLARY
GLUCOSE-CAPILLARY: 159 mg/dL — AB (ref 65–99)
GLUCOSE-CAPILLARY: 246 mg/dL — AB (ref 65–99)
GLUCOSE-CAPILLARY: 258 mg/dL — AB (ref 65–99)
Glucose-Capillary: 229 mg/dL — ABNORMAL HIGH (ref 65–99)

## 2017-04-03 LAB — OCCULT BLOOD X 1 CARD TO LAB, STOOL: Fecal Occult Bld: POSITIVE — AB

## 2017-04-03 LAB — PROTIME-INR
INR: 1.61
Prothrombin Time: 19 seconds — ABNORMAL HIGH (ref 11.4–15.2)

## 2017-04-03 MED ORDER — PANTOPRAZOLE SODIUM 40 MG PO TBEC
40.0000 mg | DELAYED_RELEASE_TABLET | Freq: Every day | ORAL | Status: DC
  Administered 2017-04-03 – 2017-04-08 (×6): 40 mg via ORAL
  Filled 2017-04-03 (×6): qty 1

## 2017-04-03 MED ORDER — PREDNISONE 20 MG PO TABS
30.0000 mg | ORAL_TABLET | Freq: Every day | ORAL | Status: DC
  Administered 2017-04-04 – 2017-04-08 (×5): 30 mg via ORAL
  Filled 2017-04-03 (×5): qty 1

## 2017-04-03 MED ORDER — WARFARIN SODIUM 4 MG PO TABS
4.0000 mg | ORAL_TABLET | Freq: Once | ORAL | Status: AC
  Administered 2017-04-03: 4 mg via ORAL
  Filled 2017-04-03: qty 1

## 2017-04-03 NOTE — Progress Notes (Signed)
Progress Note  Patient Name: Alexis [name] Date of Encounter: 04/03/2017  Primary Cardiologist: Dr [name]  Subjective   No dyspnea or CP  Inpatient Medications    Scheduled Meds: . atorvastatin  20 mg Oral Daily  . cholecalciferol  1,000 Units Oral q morning - 10a  . escitalopram  10 mg Oral Daily  . feeding supplement (ENSURE ENLIVE)  237 mL Oral BID BM  . fenofibrate  160 mg Oral Daily  . gabapentin  100 mg Oral QHS  . guaiFENesin  600 mg Oral BID  . hydrALAZINE  75 mg Oral BID  . insulin aspart  0-15 Units Subcutaneous TID WC  . insulin aspart  5 Units Subcutaneous TID WC  . insulin detemir  5 Units Subcutaneous Daily  . ipratropium-albuterol  3 mL Nebulization TID  . isosorbide mononitrate  30 mg Oral Daily  . methimazole  5 mg Oral Daily  . metoprolol tartrate  12.5 mg Oral BID  . multivitamin with minerals  1 tablet Oral Daily  . pantoprazole  40 mg Oral Daily  . polyethylene glycol  17 g Oral BID  . [START ON 04/04/2017] predniSONE  30 mg Oral Q breakfast  . senna  1 tablet Oral QHS  . sodium chloride flush  3 mL Intravenous Q12H  . Warfarin - Pharmacist Dosing Inpatient   Does not apply q1800   Continuous Infusions: . sodium chloride Stopped (03/29/17 0150)  . ceFEPime (MAXIPIME) IV Stopped (04/02/17 1829)   PRN Meds: sodium chloride, acetaminophen **OR** acetaminophen, albuterol, ondansetron **OR** ondansetron (ZOFRAN) IV, sodium chloride flush, traZODone   Vital Signs    Vitals:   04/02/17 2034 04/02/17 2100 04/03/17 0542 04/03/17 0928  BP:  (!) 122/54 128/66   Pulse:  77 67   Resp:  16 16   Temp:  98 F (36.7 C) 97.6 F (36.4 C)   TempSrc:  Oral Oral   SpO2: 97% 97% 99% 98%  Weight:   155 lb (70.3 kg)   Height:        Intake/Output Summary (Last 24 hours) at 04/03/2017 0954 Last data filed at 04/03/2017 0546 Gross per 24 hour  Intake 480 ml  Output 1425 ml  Net -945 ml   Filed Weights   04/01/17 0327 04/02/17 0517 04/03/17 0542    Weight: 157 lb 12.8 oz (71.6 kg) 156 lb (70.8 kg) 155 lb (70.3 kg)    Telemetry    Atrial fibrillation rate controlled- Personally Reviewed   Physical Exam   GEN: WD WN NAD Neck: supple, no JVD HEENT: normal Cardiac: irregular; normal rate Respiratory: mildly diminished BS throughout GI: NT, soft, no masses MS: no edema Neuro:  no focal findings   Labs    Chemistry Recent Labs  Lab 04/01/17 0647 04/02/17 0333 04/03/17 0511  NA 138 136 136  K 4.8 5.3* 5.4*  CL 102 100* 100*  CO2 28 27 27   GLUCOSE 282* 292* 179*  BUN 98* 113* 123*  CREATININE 2.94* 3.02* 2.89*  CALCIUM 8.9 8.7* 8.6*  GFRNONAA 14* 13* 14*  GFRAA 16* 15* 16*  ANIONGAP 8 9 9      Hematology Recent Labs  Lab 03/31/17 0431 04/01/17 0647 04/01/17 1901 04/02/17 0333  WBC 12.4* 10.6*  --  8.4  RBC 2.68* 2.57*  --  2.89*  HGB 7.2* 6.8* 8.2* 7.8*  HCT 23.4* 22.3* 26.0* 25.3*  MCV 87.3 86.8  --  87.5  MCH 26.9 26.5  --  27.0  MCHC 30.8  30.5  --  30.8  RDW 16.7* 16.8*  --  16.1*  PLT 265 235  --  235     Cardiac Studies   Echocardiogram 03/21/17: - Left ventricle: The cavity size was normal. Wall thickness was   normal. Systolic function was vigorous. The estimated ejection   fraction was in the range of 65% to 70%. Wall motion was normal;   there were no regional wall motion abnormalities. Features are   consistent with a pseudonormal left ventricular filling pattern,   with concomitant abnormal relaxation and increased filling   pressure (grade 2 diastolic dysfunction). - Aortic valve: Trileaflet; moderately thickened, moderately   calcified leaflets. There was mild stenosis. Peak velocity (S):   276 cm/s. Mean gradient (S): 15 mm Hg. Valve area (VTI): 1.69   cm^2. Valve area (Vmax): 1.76 cm^2. Valve area (Vmean): 1.64   cm^2. - Mitral valve: Moderately calcified annulus. Mildly thickened   leaflets . There was moderate regurgitation. - Left atrium: The atrium was moderately to  severely dilated.   Volume/bsa, S: 49.5 ml/m^2. - Right atrium: The atrium was moderately dilated. - Tricuspid valve: There was moderate regurgitation. - Pulmonary arteries: Systolic pressure was severely increased. PA   peak pressure: 70 mm Hg (S).  Impressions:  - Compared to the prior study, there has been no significant   interval change.  Patient Profile     83 y.o. female admitted with acute on chronic diastolic heart failure, increased shortness of breath, fatigue  Assessment & Plan    Acute on chronic diastolic heart failure -BUN continues to increase; may be contribution from steroids; not volume overloaded; continue to hold diuretics.  COPD exacerbation - Management per IM.  Paroxysmal atrial fibrillation -CHADSVASc score 6.   -Continue coumadin and will continue metoprolol for rate control.  Mild aortic stenosis/Moderate MR -Fu echos in the future but would like to be conservative given age and overall medical condition.  For questions or updates, please contact [facility] Please consult [email or web address] for contact info under Cardiology/STEMI.      Signed, Olga MillersBrian Crenshaw, MD  04/03/2017, 9:54 AM

## 2017-04-03 NOTE — Progress Notes (Signed)
ANTICOAGULATION CONSULT NOTE   Pharmacy Consult for Warfarin Indication: atrial fibrillation  Labs: Recent Labs    04/01/17 0647 04/01/17 1901 04/02/17 0333 04/03/17 0511  HGB 6.8* 8.2* 7.8*  --   HCT 22.3* 26.0* 25.3*  --   PLT 235  --  235  --   LABPROT 40.1*  --  25.3* 19.0*  INR 4.10*  --  2.32 1.61  CREATININE 2.94*  --  3.02* 2.89*    Estimated Creatinine Clearance: 13.6 mL/min (A) (by C-G formula based on SCr of 2.89 mg/dL (H)).  Assessment: 75 YOF on warfarin PTA for AFib - Home dose (as per Coumadin Clinic note on 10/12) was 4mg  daily except 6mg  on Mondays - dose was reduced to that regimen during that appointment d/t INR of 3.6 in the clinic. CHADSVASc score 6  INR is now sub-therapeutic at 1.61 after 1 dose of warfarin. No CBC today. Drug interactions include fenofibrate and methimazole   Goal of Therapy:  INR 2-3 Monitor platelets by anticoagulation protocol: Yes   Plan:  Warfarin 4mg  PO x 1 tonight Daily INR  Vinnie Level, PharmD., BCPS Clinical Pharmacist Pager [phone]

## 2017-04-03 NOTE — Progress Notes (Signed)
PROGRESS NOTE    Alexis [name]  LSL:[id number] DOB: Jun 07, 1931 DOA: 03/20/2017 PCP: [name], PA-C  Brief Narrative:83 year old woman PMH diastolic heart failure, oxygen dependent COPD presented with increasing shortness of breath, orthopnea and worsening hypoxia. Admitted for acute on chronic diastolic congestive heart failure, acute on chronic hypoxic respiratory failure. Gradually improved with diuresis but developed AKI prolonging hospitalization. Renal function peaked but then developed acute COPD exacerbation, possible pneumoniaand recurrent CHF necessitating BiPAP and transfer to SDU. Condition now improving, COPD stabilizing and renal function appears stable.  Patient resting in bed today.denies any complaints of chest pain shortness of breath.reviewed cardiology notes.    Assessment & Plan:   Principal Problem:   COPD with acute exacerbation (HCC) Active Problems:   COPD (chronic obstructive pulmonary disease) (HCC)   Paroxysmal atrial fibrillation (HCC)   Long term current use of anticoagulant therapy   Chronic respiratory failure with hypoxia (HCC)   Type II diabetes mellitus (HCC)   AKI (acute kidney injury) (HCC)   CKD (chronic kidney disease), stage IV (HCC)   Acute on chronic diastolic CHF (congestive heart failure) (HCC)   Severe pulmonary arterial systolic hypertension (HCC)   Acute on chronic respiratory failure with hypoxia (HCC)  Acute on chronic hypoxic respiratory failure due to pneumonia/copd/chf-taper steroids.repeat chest xray done yesterday noted.with ?chf and LLL pneumonia.Marland Kitchencomplete course of antibiotics.will hold off on resuming torsemide.she does not appear to be in fluid overload at this time.  aki on ckd-renal functions worsened with diuretics.follow levels.  We will again hold diuretics today.  Rule out GI bleed.  Guaiac stools.  To see if this is the cause of her increasing BUN.  Her hemoglobin had dropped on the sixth but the repeat  hemoglobin on the same day without any intervention came back up to baseline.  She does have baseline chronic anemia.  And she is on Coumadin for atrial fibrillation.  Her creatinine decreased after holding the diuretics.  We will follow-up levels tomorrow  Type 2 dm add insulin before meals.  afib continue coumadin.  Anemia of chronic disease guaiac stools rule out GI bleed I have started her on Protonix.     DVT prophylaxis: Coumadin Code Status: DNR Family Communication none Disposition Plan: To be determined  Consultants: Cardiology  Procedures: None Antimicrobials:  Maxipime Subjective: Patient denies any chest pain shortness of breath.  She reports that she has oxygen at home 24 hours a day.   Objective: Resting in bed in no acute distress. Vitals:   04/02/17 2034 04/02/17 2100 04/03/17 0542 04/03/17 0928  BP:  (!) 122/54 128/66   Pulse:  77 67   Resp:  16 16   Temp:  98 F (36.7 C) 97.6 F (36.4 C)   TempSrc:  Oral Oral   SpO2: 97% 97% 99% 98%  Weight:   70.3 kg (155 lb)   Height:        Intake/Output Summary (Last 24 hours) at 04/03/2017 1344 Last data filed at 04/03/2017 0546 Gross per 24 hour  Intake 240 ml  Output 1425 ml  Net -1185 ml   Filed Weights   04/01/17 0327 04/02/17 0517 04/03/17 0542  Weight: 71.6 kg (157 lb 12.8 oz) 70.8 kg (156 lb) 70.3 kg (155 lb)    Examination:  General exam: Appears calm and comfortable  Respiratory system: Clear to auscultation. Respiratory effort normal. Cardiovascular system: S1 & S2 heard, RRR. No JVD, murmurs, rubs, gallops or clicks. No pedal edema. Gastrointestinal system: Abdomen is nondistended,  soft and nontender. No organomegaly or masses felt. Normal bowel sounds heard. Central nervous system: Alert and oriented. No focal neurological deficits. Extremities: Symmetric 5 x 5 power. Skin: No rashes, lesions or ulcers Psychiatry: Judgement and insight appear normal. Mood & affect appropriate.      Data Reviewed: I have personally reviewed following labs and imaging studies  CBC: Recent Labs  Lab 03/30/17 0245 03/31/17 0431 04/01/17 0647 04/01/17 1901 04/02/17 0333  WBC 8.0 12.4* 10.6*  --  8.4  HGB 7.5* 7.2* 6.8* 8.2* 7.8*  HCT 24.8* 23.4* 22.3* 26.0* 25.3*  MCV 87.9 87.3 86.8  --  87.5  PLT 237 265 235  --  235   Basic Metabolic Panel: Recent Labs  Lab 03/30/17 0245 03/31/17 0431 04/01/17 0647 04/02/17 0333 04/03/17 0511  NA 139 140 138 136 136  K 4.0 3.9 4.8 5.3* 5.4*  CL 102 103 102 100* 100*  CO2 27 27 28 27 27   GLUCOSE 242* 206* 282* 292* 179*  BUN 62* 77* 98* 113* 123*  CREATININE 2.62* 2.71* 2.94* 3.02* 2.89*  CALCIUM 8.7* 8.9 8.9 8.7* 8.6*   GFR: Estimated Creatinine Clearance: 13.6 mL/min (A) (by C-G formula based on SCr of 2.89 mg/dL (H)). Liver Function Tests: No results for input(s): AST, ALT, ALKPHOS, BILITOT, PROT, ALBUMIN in the last 168 hours. No results for input(s): LIPASE, AMYLASE in the last 168 hours. No results for input(s): AMMONIA in the last 168 hours. Coagulation Profile: Recent Labs  Lab 03/30/17 0245 03/31/17 0431 04/01/17 0647 04/02/17 0333 04/03/17 0511  INR 6.48* 3.96 4.10* 2.32 1.61   Cardiac Enzymes: No results for input(s): CKTOTAL, CKMB, CKMBINDEX, TROPONINI in the last 168 hours. BNP (last 3 results) No results for input(s): PROBNP in the last 8760 hours. HbA1C: No results for input(s): HGBA1C in the last 72 hours. CBG: Recent Labs  Lab 04/02/17 1509 04/02/17 2125 04/02/17 2133 04/03/17 0745 04/03/17 1123  GLUCAP 286* 194* 194* 159* 229*   Lipid Profile: No results for input(s): CHOL, HDL, LDLCALC, TRIG, CHOLHDL, LDLDIRECT in the last 72 hours. Thyroid Function Tests: No results for input(s): TSH, T4TOTAL, FREET4, T3FREE, THYROIDAB in the last 72 hours. Anemia Panel: No results for input(s): VITAMINB12, FOLATE, FERRITIN, TIBC, IRON, RETICCTPCT in the last 72 hours. Sepsis Labs: Recent Labs  Lab  03/29/17 1455  LATICACIDVEN 1.2    Recent Results (from the past 240 hour(s))  Culture, blood (routine x 2) Call MD if unable to obtain prior to antibiotics being given     Status: None   Collection Time: 03/29/17  2:55 PM  Result Value Ref Range Status   Specimen Description BLOOD LEFT ANTECUBITAL  Final   Special Requests IN PEDIATRIC BOTTLE Blood Culture adequate volume  Final   Culture NO GROWTH 5 DAYS  Final   Report Status 04/03/2017 FINAL  Final  Culture, blood (routine x 2) Call MD if unable to obtain prior to antibiotics being given     Status: None   Collection Time: 03/29/17  3:02 PM  Result Value Ref Range Status   Specimen Description BLOOD LEFT HAND  Final   Special Requests   Final    BOTTLES DRAWN AEROBIC ONLY Blood Culture adequate volume   Culture NO GROWTH 5 DAYS  Final   Report Status 04/03/2017 FINAL  Final  MRSA PCR Screening     Status: None   Collection Time: 03/30/17  7:57 AM  Result Value Ref Range Status   MRSA by PCR NEGATIVE NEGATIVE  Final    Comment:        The GeneXpert MRSA Assay (FDA approved for NASAL specimens only), is one component of a comprehensive MRSA colonization surveillance program. It is not intended to diagnose MRSA infection nor to guide or monitor treatment for MRSA infections.          Radiology Studies: Dg Chest Port 1 View  Result Date: 04/02/2017 CLINICAL DATA:  Lower extremity soft tissue edema EXAM: PORTABLE CHEST 1 VIEW COMPARISON:  March 29, 2017 FINDINGS: There are bilateral pleural effusions. There is interstitial and alveolar edema bilaterally. There is airspace consolidation in the left lower lobe. There is cardiomegaly with pulmonary venous hypertension. There is aortic atherosclerosis. No evident adenopathy. There are old healed rib fractures on the left. IMPRESSION: Findings which are felt to be radiographically indicative of congestive heart failure. There is questionable superimposed pneumonia left lower  lobe. Cardiomegaly is stable. There is aortic atherosclerosis. Old healed rib fractures on left. Aortic Atherosclerosis (ICD10-I70.0). Electronically Signed   By: Bretta Bang III M.D.   On: 04/02/2017 16:30        Scheduled Meds: . atorvastatin  20 mg Oral Daily  . cholecalciferol  1,000 Units Oral q morning - 10a  . escitalopram  10 mg Oral Daily  . feeding supplement (ENSURE ENLIVE)  237 mL Oral BID BM  . fenofibrate  160 mg Oral Daily  . gabapentin  100 mg Oral QHS  . guaiFENesin  600 mg Oral BID  . hydrALAZINE  75 mg Oral BID  . insulin aspart  0-15 Units Subcutaneous TID WC  . insulin aspart  5 Units Subcutaneous TID WC  . insulin detemir  5 Units Subcutaneous Daily  . ipratropium-albuterol  3 mL Nebulization TID  . isosorbide mononitrate  30 mg Oral Daily  . methimazole  5 mg Oral Daily  . metoprolol tartrate  12.5 mg Oral BID  . multivitamin with minerals  1 tablet Oral Daily  . pantoprazole  40 mg Oral Daily  . polyethylene glycol  17 g Oral BID  . [START ON 04/04/2017] predniSONE  30 mg Oral Q breakfast  . senna  1 tablet Oral QHS  . sodium chloride flush  3 mL Intravenous Q12H  . warfarin  4 mg Oral ONCE-1800  . Warfarin - Pharmacist Dosing Inpatient   Does not apply q1800   Continuous Infusions: . sodium chloride Stopped (03/29/17 0150)  . ceFEPime (MAXIPIME) IV Stopped (04/02/17 1829)     LOS: 14 days     Alwyn Ren, MD [facility]   If 7PM-7AM, please contact night-coverage [email or web address] Password [email or web address] 04/03/2017, 1:44 PM

## 2017-04-03 NOTE — Progress Notes (Signed)
  Speech Language Pathology Treatment: Dysphagia  Patient Details Name: Alexis [name] MRN: [id number] DOB: 09/15/1931 Today's Date: 04/03/2017 Time: 1115-5208 SLP Time Calculation (min) (ACUTE ONLY): 22 min  Assessment / Plan / Recommendation Clinical Impression  Pt sleeping soundly in chair with son and daughter present. SLP updated re: initial assessment done at bedside 11/6 and treatment session yesterday. Daughter thought pt had an MBS earlier this week. They voiced concern over pt coughing with food and liquid prior to admit. SLP explained potential for increased dyspnea during meals with COPD and importance of rest breaks and avoiding po's when SOB. Discussed potential of esophageal concern during last years MBS when pill hesitated in esophagus. Given families report of difficulty yesterday during yesterday's ST session and families report, recommend MBS tomorrow morning. Continue Dys 2, thin liquids for now.   **Family also concerned today that pt may be too weak to go home with home health (lives with son). Advised if still concerned request to speak with PT/OT tomorrow.    HPI HPI: [name] a20 y.o.female,with past medical history relevant for diastolic dysfunction CHF, COPD on 2-3 L of oxygen at home, DM, MI admitted with a 2 to three-day history of worsening shortness of breath, orthopnea and increased oxygen requirement.CXR 11/3 findings are similar to the most recent prior exam with bilateral patchy airspace lung opacities. Name opacity at the lung bases is likely due to the development of small pleural effusions. Findings are consistent with either asymmetric pulmonary edema or multifocal pneumonia. MBS 02/28/16 functional swallow, slight oral holding; barium pill hesitated mid esophagus and transited iwth additional puree. Reg/thin recommended.       SLP Plan  MBS       Recommendations  Diet recommendations: Thin liquid;Nectar-thick liquid Liquids  provided via: Cup;Straw Medication Administration: Whole meds with puree Supervision: Patient able to self feed;Intermittent supervision to cue for compensatory strategies Compensations: Slow rate;Small sips/bites Postural Changes and/or Swallow Maneuvers: Seated upright 90 degrees;Upright 30-60 min after meal                Oral Care Recommendations: Oral care BID Follow up Recommendations: (TBD) SLP Visit Diagnosis: Dysphagia, unspecified (R13.10) Plan: MBS       GO                Alexis [name] 04/03/2017, 1:55 PM  Alexis [name] Alexis [name] M.Ed [employer] [phone]

## 2017-04-03 NOTE — Progress Notes (Signed)
Physical Therapy Treatment Patient Details Name: Alexis GivensChristine E Diers MRN: [id number] DOB: 04/20/1932 Today's Date: 04/03/2017    History of Present Illness Pt is an 83 y.o. female admitted 03/20/17 with SOB, orthopnea, weight gain, increased O2 requirement.  Patient with CHF exacerbation and UTI.   PMH:  CHF, PAF, DM, neuropathy, COPD on home O2 at 2L, HTN, CKD, anemia, macular degeneration, confusion.     PT Comments    Pt tolerated session well but only requested bed to chair today.  O2 sats stable >/= 90% on 2L O2.   Follow Up Recommendations  Home health PT;Supervision for mobility/OOB     Equipment Recommendations       Recommendations for Other Services       Precautions / Restrictions Precautions Precautions: Fall Precaution Comments: SOB with all activity, on 2LO2via  Restrictions Weight Bearing Restrictions: No    Mobility  Bed Mobility Overal bed mobility: Needs Assistance Bed Mobility: Supine to Sit     Supine to sit: Min assist;HOB elevated        Transfers Overall transfer level: Needs assistance Equipment used: Rolling walker (2 wheeled) Transfers: Pharmacologisttand Pivot Transfers;Sit to/from Stand Sit to Stand: Min guard Stand pivot transfers: Min guard       General transfer comment: v/c's for hand placement, increased time, uncontrolled descent to sit  Ambulation/Gait                 Stairs            Wheelchair Mobility    Modified Rankin (Stroke Patients Only)       Balance   Sitting-balance support: No upper extremity supported;Feet supported Sitting balance-Leahy Scale: Good     Standing balance support: Bilateral upper extremity supported;During functional activity Standing balance-Leahy Scale: Good Standing balance comment: reliant on UE support                             Cognition Arousal/Alertness: Awake/alert Behavior During Therapy: WFL for tasks assessed/performed                                          Exercises      General Comments        Pertinent Vitals/Pain Pain Assessment: No/denies pain    Home Living                      Prior Function            PT Goals (current goals can now be found in the care plan section) Acute Rehab PT Goals Patient Stated Goal: To return home PT Goal Formulation: With patient Time For Goal Achievement: 04/04/17 Potential to Achieve Goals: Good Progress towards PT goals: Progressing toward goals    Frequency    Min 3X/week      PT Plan Current plan remains appropriate    Co-evaluation              AM-PAC PT "6 Clicks" Daily Activity  Outcome Measure                   End of Session Equipment Utilized During Treatment: Gait belt;Oxygen Activity Tolerance: Patient tolerated treatment well(O2 sats remained stable on 2L O2) Patient left: in chair;with call bell/phone within reach Nurse Communication: Mobility status PT Visit Diagnosis: Unsteadiness on feet (  R26.81);Muscle weakness (generalized) (M62.81)     Time: 6269-4854 PT Time Calculation (min) (ACUTE ONLY): 30 min  Charges:  $Therapeutic Activity: 23-37 mins                    G Codes:       Clarita Crane, PT, DPT [phone]    Moshe Cipro K 04/03/2017, 8:48 AM

## 2017-04-03 NOTE — Progress Notes (Signed)
Focus of session on toileting, UB dressing and seated grooming. Pt continues to demonstrate decreased activity tolerance. Educated in use of incentive spirometer.   04/03/17 0900  OT Visit Information  Last OT Received On 04/03/17  Assistance Needed +1  History of Present Illness Pt is an 83 y.o. female admitted 03/20/17 with SOB, orthopnea, weight gain, increased O2 requirement.  Patient with CHF exacerbation and UTI.   PMH:  CHF, PAF, DM, neuropathy, COPD on home O2 at 2L, HTN, CKD, anemia, macular degeneration, confusion.   Precautions  Precautions Fall  Precaution Comments pt on 2L via [place]  Pain Assessment  Pain Assessment No/denies pain  Cognition  Arousal/Alertness Awake/alert  Behavior During Therapy WFL for tasks assessed/performed  Overall Cognitive Status Within Functional Limits for tasks assessed  General Comments Pt HOH.  ADL  Overall ADL's  Needs assistance/impaired  Grooming Wash/dry hands;Wash/dry face;Brushing hair;Sitting;Set up  Upper Body Dressing  Minimal assistance;Sitting  Toilet Transfer Min guard;Stand-pivot;RW;BSC  Toileting- Clothing Manipulation and Hygiene Total assistance;Sit to/from stand  Toileting - Clothing Manipulation Details (indicate cue type and reason) following BM  General ADL Comments Pt fatigues quickly with activity   Bed Mobility  General bed mobility comments Pt sitting up in recliner   Transfers  Equipment used Rolling walker (2 wheeled)  Transfers Sit to/from [employer]  Sit to [employer] guard  Stand pivot transfers Min guard  General transfer comment v/c's for hand placement, increased time  OT - End of Session  Equipment Utilized During Treatment Rolling walker;Oxygen  Activity Tolerance Patient limited by fatigue  Patient left in chair;with call bell/phone within reach;with nursing/sitter in room  Nurse Communication (aware pt had BM)  OT Assessment/Plan  OT Plan Discharge plan remains appropriate  OT Visit  Diagnosis Muscle weakness (generalized) (M62.81)  OT Frequency (ACUTE ONLY) Min 2X/week  Follow Up Recommendations Home health OT;Supervision/Assistance - 24 hour  OT Equipment None recommended by OT  AM-PAC OT "6 Clicks" Daily Activity Outcome Measure  Help from another person eating meals? 4  Help from another person taking care of personal grooming? 3  Help from another person toileting, which includes using toliet, bedpan, or urinal? 2  Help from another person bathing (including washing, rinsing, drying)? 2  Help from another person to put on and taking off regular upper body clothing? 3  Help from another person to put on and taking off regular lower body clothing? 2  6 Click Score 16  ADL G Code Conversion CK  OT Goal Progression  Progress towards OT goals Progressing toward goals  Acute Rehab OT Goals  Patient Stated Goal To return home  OT Goal Formulation With patient  Time For Goal Achievement 04/14/17  Potential to Achieve Goals Good  OT Time Calculation  OT Start Time (ACUTE ONLY) 0844  OT Stop Time (ACUTE ONLY) 0913  OT Time Calculation (min) 29 min  OT General Charges  $OT Visit 1 Visit  OT Treatments  $Self Care/Home Management  23-37 mins  04/03/2017 Martie Round, OTR/L Pager: [phone]

## 2017-04-03 NOTE — Care Management Important Message (Signed)
Important Message  Patient Details  Name: Alexis [name] MRN: [id number] Date of Birth: May 30, 1931   Medicare Important Message Given:  Yes    Carolyn Maniscalco Abena 04/03/2017, 1:16 PM

## 2017-04-04 ENCOUNTER — Inpatient Hospital Stay (HOSPITAL_COMMUNITY): Payer: Medicare Other

## 2017-04-04 LAB — BASIC METABOLIC PANEL
ANION GAP: 8 (ref 5–15)
BUN: 124 mg/dL — ABNORMAL HIGH (ref 6–20)
CO2: 28 mmol/L (ref 22–32)
Calcium: 8.4 mg/dL — ABNORMAL LOW (ref 8.9–10.3)
Chloride: 100 mmol/L — ABNORMAL LOW (ref 101–111)
Creatinine, Ser: 2.79 mg/dL — ABNORMAL HIGH (ref 0.44–1.00)
GFR, EST AFRICAN AMERICAN: 17 mL/min — AB (ref >60)
GFR, EST NON AFRICAN AMERICAN: 15 mL/min — AB (ref >60)
Glucose, Bld: 207 mg/dL — ABNORMAL HIGH (ref 65–99)
POTASSIUM: 5.4 mmol/L — AB (ref 3.5–5.1)
Sodium: 136 mmol/L (ref 135–145)

## 2017-04-04 LAB — GLUCOSE, CAPILLARY
GLUCOSE-CAPILLARY: 126 mg/dL — AB (ref 65–99)
GLUCOSE-CAPILLARY: 74 mg/dL (ref 65–99)
GLUCOSE-CAPILLARY: 189 mg/dL — AB (ref 65–99)
Glucose-Capillary: 167 mg/dL — ABNORMAL HIGH (ref 65–99)

## 2017-04-04 LAB — CBC WITH DIFFERENTIAL/PLATELET
BASOS ABS: 0 10*3/uL (ref 0.0–0.1)
BASOS PCT: 0 %
EOS PCT: 1 %
Eosinophils Absolute: 0.1 10*3/uL (ref 0.0–0.7)
HCT: 24.2 % — ABNORMAL LOW (ref 36.0–46.0)
Hemoglobin: 7.5 g/dL — ABNORMAL LOW (ref 12.0–15.0)
LYMPHS PCT: 9 %
Lymphs Abs: 0.8 10*3/uL (ref 0.7–4.0)
MCH: 27.5 pg (ref 26.0–34.0)
MCHC: 31 g/dL (ref 30.0–36.0)
MCV: 88.6 fL (ref 78.0–100.0)
Monocytes Absolute: 0.3 10*3/uL (ref 0.1–1.0)
Monocytes Relative: 3 %
Neutro Abs: 7.8 10*3/uL — ABNORMAL HIGH (ref 1.7–7.7)
Neutrophils Relative %: 87 %
PLATELETS: 236 10*3/uL (ref 150–400)
RBC: 2.73 MIL/uL — ABNORMAL LOW (ref 3.87–5.11)
RDW: 16.6 % — ABNORMAL HIGH (ref 11.5–15.5)
WBC: 8.9 10*3/uL (ref 4.0–10.5)

## 2017-04-04 LAB — PROTIME-INR
INR: 1.82
Prothrombin Time: 20.9 seconds — ABNORMAL HIGH (ref 11.4–15.2)

## 2017-04-04 MED ORDER — WARFARIN SODIUM 5 MG PO TABS
5.0000 mg | ORAL_TABLET | Freq: Once | ORAL | Status: AC
  Administered 2017-04-04: 5 mg via ORAL
  Filled 2017-04-04: qty 1

## 2017-04-04 NOTE — Progress Notes (Signed)
RT placed pt on BIPAP 10/5, 30%, and a set rate of 10. Pt tolerating well and vitals are stable. RT to cont to monitor.

## 2017-04-04 NOTE — Progress Notes (Signed)
Dr. Jerolyn Center paged regarding pt's family's request that patient go to a SNF instead of home with [facility]. Will ask MD if she would like to order PT re-evaluation and consult to social worker for SNF placement. Awaiting call back.

## 2017-04-04 NOTE — Plan of Care (Signed)
Pt requires assist x1, breathing improved from admission, activity as tolerated

## 2017-04-04 NOTE — Progress Notes (Signed)
ANTICOAGULATION CONSULT NOTE   Pharmacy Consult for Warfarin Indication: atrial fibrillation  Labs: Recent Labs    04/01/17 1901 04/02/17 0333 04/03/17 0511 04/04/17 0354  HGB 8.2* 7.8*  --  7.5*  HCT 26.0* 25.3*  --  24.2*  PLT  --  235  --  236  LABPROT  --  25.3* 19.0* 20.9*  INR  --  2.32 1.61 1.82  CREATININE  --  3.02* 2.89* 2.79*    Estimated Creatinine Clearance: 14.6 mL/min (A) (by C-G formula based on SCr of 2.79 mg/dL (H)).  Assessment: 82 YOF on warfarin PTA for AFib - Home dose (as per [facility] note on 10/12) was 4mg  daily except 6mg  on Mondays - dose was reduced to that regimen during that appointment d/t INR of 3.6 in the clinic. CHADSVASc score 6  INR is still sub-therapeutic at 1.8. Hgb low stable at 7.5, no bleeding noted.   Drug interactions include fenofibrate and methimazole   Goal of Therapy:  INR 2-3 Monitor platelets by anticoagulation protocol: Yes   Plan:  Warfarin 5mg  PO x 1 tonight Daily INR  Sheppard Coil PharmD., BCPS Clinical Pharmacist 04/04/2017 11:48 AM

## 2017-04-04 NOTE — Progress Notes (Signed)
Modified Barium Swallow Progress Note  Patient Details  Name: Alexis [name] MRN: [id number] Date of Birth: 1932-02-11  Today's Date: 04/04/2017  Modified Barium Swallow completed.  Full report located under Chart Review in the Imaging Section.  Brief recommendations include the following:  Clinical Impression  MBS results same per documentation as prior study 02/2016. No significant impairments with oral control, manipulation or transit. Pharyngeal phase intact with timely initiation of protective mechanisms, hyolaryngeal excursion and elevation and epiglottic deflection. No pharyngeal residue. Pill observed to stop in mid esophagus with sudden retrograde movment of barium close to pharynx requiring puree to assist in transit through GE junction (thin barium ineffective). Pt prefers to remain on Dys 2 texture; recommend continue thin liquids and bite puree following pills to ensure transit through esophagus. Esophageal precautions (remain upright after meals 45 min to 1 hour, smaller meals more frequently, elevate head at night). No further ST needed.     Swallow Evaluation Recommendations       SLP Diet Recommendations: Dysphagia 2 (Fine chop) solids;Thin liquid   Liquid Administration via: Straw;Cup   Medication Administration: Whole meds with liquid   Supervision: Patient able to self feed   Compensations: Slow rate;Small sips/bites       Oral Care Recommendations: Oral care BID        Royce Macadamia 04/04/2017,3:44 PM   Breck Coons [place].Ed [employer] [phone]

## 2017-04-04 NOTE — Progress Notes (Signed)
PROGRESS NOTE    Alexis [name]  ZOX:[id number] DOB: 04-Aug-1931 DOA: 03/20/2017 PCP: [name], PA-C   Brief Narrative:83 year old woman PMH diastolic heart failure, oxygen dependent COPD presented with increasing shortness of breath, orthopnea and worsening hypoxia. Admitted for acute on chronic diastolic congestive heart failure, acute on chronic hypoxic respiratory failure. Gradually improved with diuresis but developed AKI prolonging hospitalization. Renal function peaked but then developed acute COPD exacerbation, possible pneumoniaand recurrent CHF necessitating BiPAP and transfer to SDU. Condition now improving, COPD stabilizing and renal function appears stable.  Patient resting in bed today.denies any complaints of chest pain shortness of breath.reviewed cardiology notes.  Reviewed nephrology and GI evaluation and appreciated.    Assessment & Plan:   Principal Problem:   COPD with acute exacerbation (HCC) Active Problems:   COPD (chronic obstructive pulmonary disease) (HCC)   Paroxysmal atrial fibrillation (HCC)   Long term current use of anticoagulant therapy   Chronic respiratory failure with hypoxia (HCC)   Type II diabetes mellitus (HCC)   AKI (acute kidney injury) (HCC)   CKD (chronic kidney disease), stage IV (HCC)   Acute on chronic diastolic CHF (congestive heart failure) (HCC)   Severe pulmonary arterial systolic hypertension (HCC)   Acute on chronic respiratory failure with hypoxia (HCC)  Acute on chronic hypoxic respiratory failure due to pneumonia/copd/chf-taper steroids.repeat chest xray done yesterday noted.with ?chf and LLL pneumonia.Marland Kitchencomplete course of antibiotics.will hold off on resuming torsemide.she does not appear to be in fluid overload at this time.  aki on ckd-renal functions worsened with diuretics.follow levels.  We will again hold diuretics today.  Rule out GI bleed.  Guaiac stools.  To see if this is the cause of her increasing BUN.  Her  hemoglobin had dropped on the sixth but the repeat hemoglobin on the same day without any intervention came back up to baseline.  She does have baseline chronic anemia.  And she is on Coumadin for atrial fibrillation.  Her creatinine decreased after holding the diuretics.  We will follow-up levels tomorrow  Type 2 dm add insulin before meals.  afib continue coumadin.  Anemia of chronic disease guaiac stools rule out GI bleed I have started her on Protonix.  Follow-up labs tomorrow blood transfusion if hemoglobin drops less than 7.     DVT prophylaxis: Coumadin Code Status: DNR Family Communication: We will discuss with family Disposition Plan: Child psychotherapist consult PT evaluation for skilled nursing facility placement  Consultants: Cardiology, nephrology, GI Procedures: None none Antimicrobials: Maxipime  Subjective: Feels okay   Objective: Resting in bed in no acute distress Vitals:   04/04/17 0519 04/04/17 1039 04/04/17 1305 04/04/17 1345  BP: 131/67  (!) 110/49   Pulse: 65 65 75   Resp: 12 18 19    Temp: (!) 97.4 F (36.3 C)  (!) 97.3 F (36.3 C)   TempSrc: Axillary  Oral   SpO2: 97% 98% 94% 96%  Weight: 75.8 kg (167 lb)     Height:        Intake/Output Summary (Last 24 hours) at 04/04/2017 1638 Last data filed at 04/04/2017 0500 Gross per 24 hour  Intake 3 ml  Output 1000 ml  Net -997 ml   Filed Weights   04/02/17 0517 04/03/17 0542 04/04/17 0519  Weight: 70.8 kg (156 lb) 70.3 kg (155 lb) 75.8 kg (167 lb)    Examination:  General exam: Appears calm and comfortable  Respiratory system: Clear to auscultation. Respiratory effort normal. Cardiovascular system: S1 & S2 heard, RRR.  No JVD, murmurs, rubs, gallops or clicks. No pedal edema. Gastrointestinal system: Abdomen is nondistended, soft and nontender. No organomegaly or masses felt. Normal bowel sounds heard. Central nervous system: Alert and oriented. No focal neurological deficits. Extremities: Symmetric  5 x 5 power. Skin: No rashes, lesions or ulcers Psychiatry: Judgement and insight appear normal. Mood & affect appropriate.     Data Reviewed: I have personally reviewed following labs and imaging studies  CBC: Recent Labs  Lab 03/30/17 0245 03/31/17 0431 04/01/17 0647 04/01/17 1901 04/02/17 0333 04/04/17 0354  WBC 8.0 12.4* 10.6*  --  8.4 8.9  NEUTROABS  --   --   --   --   --  7.8*  HGB 7.5* 7.2* 6.8* 8.2* 7.8* 7.5*  HCT 24.8* 23.4* 22.3* 26.0* 25.3* 24.2*  MCV 87.9 87.3 86.8  --  87.5 88.6  PLT 237 265 235  --  235 236   Basic Metabolic Panel: Recent Labs  Lab 03/31/17 0431 04/01/17 0647 04/02/17 0333 04/03/17 0511 04/04/17 0354  NA 140 138 136 136 136  K 3.9 4.8 5.3* 5.4* 5.4*  CL 103 102 100* 100* 100*  CO2 27 28 27 27 28   GLUCOSE 206* 282* 292* 179* 207*  BUN 77* 98* 113* 123* 124*  CREATININE 2.71* 2.94* 3.02* 2.89* 2.79*  CALCIUM 8.9 8.9 8.7* 8.6* 8.4*   GFR: Estimated Creatinine Clearance: 14.6 mL/min (A) (by C-G formula based on SCr of 2.79 mg/dL (H)). Liver Function Tests: No results for input(s): AST, ALT, ALKPHOS, BILITOT, PROT, ALBUMIN in the last 168 hours. No results for input(s): LIPASE, AMYLASE in the last 168 hours. No results for input(s): AMMONIA in the last 168 hours. Coagulation Profile: Recent Labs  Lab 03/31/17 0431 04/01/17 0647 04/02/17 0333 04/03/17 0511 04/04/17 0354  INR 3.96 4.10* 2.32 1.61 1.82   Cardiac Enzymes: No results for input(s): CKTOTAL, CKMB, CKMBINDEX, TROPONINI in the last 168 hours. BNP (last 3 results) No results for input(s): PROBNP in the last 8760 hours. HbA1C: No results for input(s): HGBA1C in the last 72 hours. CBG: Recent Labs  Lab 04/03/17 1643 04/03/17 2147 04/04/17 0719 04/04/17 1121 04/04/17 1615  GLUCAP 246* 258* 167* 126* 74   Lipid Profile: No results for input(s): CHOL, HDL, LDLCALC, TRIG, CHOLHDL, LDLDIRECT in the last 72 hours. Thyroid Function Tests: No results for input(s):  TSH, T4TOTAL, FREET4, T3FREE, THYROIDAB in the last 72 hours. Anemia Panel: No results for input(s): VITAMINB12, FOLATE, FERRITIN, TIBC, IRON, RETICCTPCT in the last 72 hours. Sepsis Labs: Recent Labs  Lab 03/29/17 1455  LATICACIDVEN 1.2    Recent Results (from the past 240 hour(s))  Culture, blood (routine x 2) Call MD if unable to obtain prior to antibiotics being given     Status: None   Collection Time: 03/29/17  2:55 PM  Result Value Ref Range Status   Specimen Description BLOOD LEFT ANTECUBITAL  Final   Special Requests IN PEDIATRIC BOTTLE Blood Culture adequate volume  Final   Culture NO GROWTH 5 DAYS  Final   Report Status 04/03/2017 FINAL  Final  Culture, blood (routine x 2) Call MD if unable to obtain prior to antibiotics being given     Status: None   Collection Time: 03/29/17  3:02 PM  Result Value Ref Range Status   Specimen Description BLOOD LEFT HAND  Final   Special Requests   Final    BOTTLES DRAWN AEROBIC ONLY Blood Culture adequate volume   Culture NO GROWTH 5 DAYS  Final   Report Status 04/03/2017 FINAL  Final  MRSA PCR Screening     Status: None   Collection Time: 03/30/17  7:57 AM  Result Value Ref Range Status   MRSA by PCR NEGATIVE NEGATIVE Final    Comment:        The GeneXpert MRSA Assay (FDA approved for NASAL specimens only), is one component of a comprehensive MRSA colonization surveillance program. It is not intended to diagnose MRSA infection nor to guide or monitor treatment for MRSA infections.          Radiology Studies: Dg Swallowing Func-speech Pathology  Result Date: 04/04/2017 Objective Swallowing Evaluation: Type of Study: MBS-Modified Barium Swallow Study  Patient Details Name: SUDIE HOBERG MRN: [id number] Date of Birth: 1931/10/25 Today's Date: 04/04/2017 Time: SLP Start Time (ACUTE ONLY): 6389 -SLP Stop Time (ACUTE ONLY): 0918 SLP Time Calculation (min) (ACUTE ONLY): 15 min Past Medical History: Past Medical History:  Diagnosis Date . Anxiety  . Chronic diastolic CHF (congestive heart failure) (HCC)   a. 05/2015 Echo: EF 55-60%, mild LVH, no rwma, mild MR, mildly dil RA, mod TR, PASP . . CKD (chronic kidney disease), stage IV (HCC)  . Confusion state  . COPD (chronic obstructive pulmonary disease) (HCC)   a. on home O2 @ 3lpm. . Elevated troponin   a. H/o elevated trop in setting of CHF; b. 06/2013 Lexiscan MV: nl EF, mild anterior breast attenuation, no ischemia.  . Family history of adverse reaction to anesthesia  . GIB (gastrointestinal bleeding)   a. History of GIB. Marland Kitchen Hypertensive heart disease  . Macular degeneration, bilateral  . Myocardial infarction (HCC)  . Neuropathy  . Paroxysmal atrial fibrillation (HCC)   a. CHA2DS2VASc = 6-->coumadin. Marland Kitchen PONV (postoperative nausea and vomiting)  . Type II diabetes mellitus (HCC)  . Varicose veins  Past Surgical History: Past Surgical History: Procedure Laterality Date . ABDOMINAL HYSTERECTOMY   . FRACTURE SURGERY   . HIP FRACTURE SURGERY  2004 . VEIN SURGERY   HPI: [name] a34 y.o.female,with past medical history relevant for diastolic dysfunction CHF, COPD on 2-3 L of oxygen at home, DM, MI admitted with a 2 to three-day history of worsening shortness of breath, orthopnea and increased oxygen requirement.CXR 11/3 findings are similar to the most recent prior exam with bilateral patchy airspace lung opacities. Name opacity at the lung bases is likely due to the development of small pleural effusions. Findings are consistent with either asymmetric pulmonary edema or multifocal pneumonia. MBS 02/28/16 functional swallow, slight oral holding; barium pill hesitated mid esophagus and transited iwth additional puree. Reg/thin recommended. Given family reports of frequent coughing with/after meals and COPD, proceed with MBS.  No Data Recorded Assessment / Plan / Recommendation CHL IP CLINICAL IMPRESSIONS 04/04/2017 Clinical Impression MBS results same per documentation  as prior study 02/2016. No significant impairments with oral control, manipulation or transit. Pharyngeal phase intact with timely initiation of protective mechanisms, hyolaryngeal excursion and elevation and epiglottic deflection. No pharyngeal residue. Pill observed to stop in mid esophagus with sudden retrograde movment of barium close to pharynx requiring puree to assist in transit through GE junction (thin barium ineffective). Pt prefers to remain on Dys 2 texture; recommend continue thin liquids and bite puree following pills to ensure transit through esophagus. Esophageal precautions (remain upright after meals 45 min to 1 hour, smaller meals more frequently, elevate head at night). No further ST needed.   SLP Visit Diagnosis Dysphagia, unspecified (R13.10) Attention and concentration deficit following --  Frontal lobe and executive function deficit following -- Impact on safety and function (No Data)   CHL IP TREATMENT RECOMMENDATION 04/04/2017 Treatment Recommendations No treatment recommended at this time   Prognosis 03/31/2017 Prognosis for Safe Diet Advancement Good Barriers to Reach Goals -- Barriers/Prognosis Comment -- CHL IP DIET RECOMMENDATION 04/04/2017 SLP Diet Recommendations Dysphagia 2 (Fine chop) solids;Thin liquid Liquid Administration via Cup;Straw Medication Administration Whole meds with liquid Compensations Slow rate;Small sips/bites Postural Changes Seated upright at 90 degrees;Remain semi-upright after after feeds/meals (Comment)   CHL IP OTHER RECOMMENDATIONS 04/04/2017 Recommended Consults -- Oral Care Recommendations Oral care BID Other Recommendations --   CHL IP FOLLOW UP RECOMMENDATIONS 04/04/2017 Follow up Recommendations None   CHL IP FREQUENCY AND DURATION 03/31/2017 Speech Therapy Frequency (ACUTE ONLY) min 2x/week Treatment Duration 2 weeks      CHL IP ORAL PHASE 04/04/2017 Oral Phase WFL Oral - Pudding Teaspoon -- Oral - Pudding Cup -- Oral - Honey Teaspoon -- Oral - Honey Cup --  Oral - Nectar Teaspoon -- Oral - Nectar Cup -- Oral - Nectar Straw -- Oral - Thin Teaspoon -- Oral - Thin Cup -- Oral - Thin Straw -- Oral - Puree -- Oral - Mech Soft -- Oral - Regular -- Oral - Multi-Consistency -- Oral - Pill -- Oral Phase - Comment --  CHL IP PHARYNGEAL PHASE 04/04/2017 Pharyngeal Phase WFL Pharyngeal- Pudding Teaspoon -- Pharyngeal -- Pharyngeal- Pudding Cup -- Pharyngeal -- Pharyngeal- Honey Teaspoon -- Pharyngeal -- Pharyngeal- Honey Cup -- Pharyngeal -- Pharyngeal- Nectar Teaspoon -- Pharyngeal -- Pharyngeal- Nectar Cup -- Pharyngeal -- Pharyngeal- Nectar Straw -- Pharyngeal -- Pharyngeal- Thin Teaspoon -- Pharyngeal -- Pharyngeal- Thin Cup -- Pharyngeal -- Pharyngeal- Thin Straw -- Pharyngeal -- Pharyngeal- Puree -- Pharyngeal -- Pharyngeal- Mechanical Soft -- Pharyngeal -- Pharyngeal- Regular -- Pharyngeal -- Pharyngeal- Multi-consistency -- Pharyngeal -- Pharyngeal- Pill -- Pharyngeal -- Pharyngeal Comment --  CHL IP CERVICAL ESOPHAGEAL PHASE 04/04/2017 Cervical Esophageal Phase WFL Pudding Teaspoon -- Pudding Cup -- Honey Teaspoon -- Honey Cup -- Nectar Teaspoon -- Nectar Cup -- Nectar Straw -- Thin Teaspoon -- Thin Cup -- Thin Straw -- Puree -- Mechanical Soft -- Regular -- Multi-consistency -- Pill -- Cervical Esophageal Comment -- No flowsheet data found. Royce Macadamia 04/04/2017, 10:38 AM Breck Coons [place].Ed CCC-SLP Pager [phone]                   Scheduled Meds: . atorvastatin  20 mg Oral Daily  . cholecalciferol  1,000 Units Oral q morning - 10a  . escitalopram  10 mg Oral Daily  . feeding supplement (ENSURE ENLIVE)  237 mL Oral BID BM  . fenofibrate  160 mg Oral Daily  . gabapentin  100 mg Oral QHS  . guaiFENesin  600 mg Oral BID  . hydrALAZINE  75 mg Oral BID  . insulin aspart  0-15 Units Subcutaneous TID WC  . insulin aspart  5 Units Subcutaneous TID WC  . insulin detemir  5 Units Subcutaneous Daily  . ipratropium-albuterol  3 mL Nebulization TID  .  isosorbide mononitrate  30 mg Oral Daily  . methimazole  5 mg Oral Daily  . metoprolol tartrate  12.5 mg Oral BID  . multivitamin with minerals  1 tablet Oral Daily  . pantoprazole  40 mg Oral Daily  . polyethylene glycol  17 g Oral BID  . predniSONE  30 mg Oral Q breakfast  . senna  1 tablet Oral QHS  . sodium  chloride flush  3 mL Intravenous Q12H  . warfarin  5 mg Oral ONCE-1800  . Warfarin - Pharmacist Dosing Inpatient   Does not apply q1800   Continuous Infusions: . sodium chloride Stopped (03/29/17 0150)  . ceFEPime (MAXIPIME) IV Stopped (04/04/17 1527)     LOS: 15 days     Alwyn RenElizabeth G Agusta Hackenberg, MD [facility]  If 7PM-7AM, please contact night-coverage [email or web address] Password [email or web address] [email or web address] 04/04/2017, 4:38 PM

## 2017-04-04 NOTE — Care Management (Signed)
1617 04-04-17 CM did speak with pt and two daughters. Per family pt has used [facility] in the past. Family had questions in regards to SNF at [facility] once pt is stable for d/c. Recommendations from PT are for Home at this time. Per Staff RN pt was weak getting to bedside commode. PT to re-consult- Staff RN to pace a consult for SW to see pt in regards to questions about SNF. CM will continue to monitor. Gala Lewandowsky, RN,BSN Case Manager [phone]

## 2017-04-04 NOTE — Consult Note (Signed)
[facility] CONSULT NOTE    Date: 04/04/2017                  Patient Name:  Alexis [name]  MRN: [id number]  DOB: 03/14/1932  Age / Sex: 83 y.o., female         PCP: Ladora DanielBeal, [name], PA-C                 Service Requesting Consult: Internal medicine                 Reason for Consult: Acute on chronic kidney disease            History of Present Illness: Patient is a 83 y.o. female with a PMHx of COPD on oxygen, T2DM, afib, CKDIV, HTN, anemia, diastolic CHF and PVD, who was [facility] for evaluation of heart failure exacerbation. She was receiving IV diuretics and developed an AKI. She then developed a COPD exacerbation and possible pneumonia. Nephrology has been consulted due to increasing Creatinine and BUN, diuretics have been held over recent days due to this.   Today patient denies any complaints. She states she uses oxygen at home and feels her breathing is fine. She is making urine. She is unsure if she follows with a kidney doctor outpatient. She is unsure if she has had kidney problems in the past. She is somewhat of a poor historian. She is unsure if she has taken NSAIDs as outpatient. No family at bedside.   Medications: Outpatient medications: Medications Prior to Admission  Medication Sig Dispense Refill Last Dose  . albuterol (PROVENTIL HFA;VENTOLIN HFA) 108 (90 BASE) MCG/ACT inhaler Inhale 2 puffs into the lungs every 6 (six) hours as needed for wheezing or shortness of breath. 1 Inhaler 3 prn  . atorvastatin (LIPITOR) 20 MG tablet Take 20 mg by mouth daily.   03/19/2017 at Unknown time  . cholecalciferol (VITAMIN D) 1000 UNITS tablet Take 1,000 Units by mouth every morning.    03/19/2017 at Unknown time  . docusate sodium (COLACE) 100 MG capsule Take 100 mg by mouth 2 (two) times daily.    03/19/2017 at Unknown time  . escitalopram (LEXAPRO) 10 MG tablet Take 10 mg by mouth daily.   03/19/2017 at Unknown time  . fenofibrate 160  MG tablet Take 160 mg by mouth daily.   03/19/2017 at Unknown time  . gabapentin (NEURONTIN) 100 MG capsule Take 1 capsule (100 mg total) by mouth at bedtime. 30 capsule 0 03/19/2017 at Unknown time  . hydrALAZINE (APRESOLINE) 50 MG tablet Take 1.5 tablets (75 mg total) by mouth 2 (two) times daily. 90 tablet 0 03/19/2017 at Unknown time  . isosorbide mononitrate (IMDUR) 30 MG 24 hr tablet TAKE 1 TABLET BY MOUTH EVERY DAY 30 tablet 6 03/19/2017 at Unknown time  . methimazole (TAPAZOLE) 5 MG tablet Take 5 mg by mouth daily.  0 03/19/2017 at Unknown time  . metoprolol tartrate (LOPRESSOR) 25 MG tablet Take 0.5 tablets (12.5 mg total) by mouth 2 (two) times daily. 60 tablet 5 03/19/2017 at Unknown time  . OXYGEN Inhale 3 L into the lungs at bedtime.    03/19/2017 at Unknown time  . polyethylene glycol (MIRALAX / GLYCOLAX) packet Take 17 g by mouth daily as needed for mild constipation.   prn  . potassium chloride SA (KLOR-CON M20) 20 MEQ tablet Take 1 tablet (20 mEq total) by mouth daily. 30 tablet 5 03/19/2017 at Unknown time  .  torsemide (DEMADEX) 20 MG tablet Take 2 tablets (40 mg total) by mouth daily. 180 tablet 3 03/19/2017 at Unknown time  . warfarin (COUMADIN) 4 MG tablet TAKE 1 TO 1&1/2 TABLETS BY MOUTH AS DIRECTED BY COUMADIN CLINIC (Patient taking differently: TAKE 4MG  BY MOUTH DAILY EXCEPT MONDAYS TAKE 6MG ) 120 tablet 1 03/19/2017 at 7PM    Current medications: Current Facility-Administered Medications  Medication Dose Route Frequency Provider Last Rate Last Dose  . 0.9 %  sodium chloride infusion  250 mL Intravenous PRN Shon Hale, MD   Stopped at 03/29/17 0150  . acetaminophen (TYLENOL) tablet 650 mg  650 mg Oral Q6H PRN Shon Hale, MD   650 mg at 03/29/17 0437   Or  . acetaminophen (TYLENOL) suppository 650 mg  650 mg Rectal Q6H PRN Emokpae, Courage, MD      . albuterol (PROVENTIL) (2.5 MG/3ML) 0.083% nebulizer solution 2.5 mg  2.5 mg Nebulization Q2H PRN Emokpae, Courage,  MD   2.5 mg at 03/29/17 1227  . atorvastatin (LIPITOR) tablet 20 mg  20 mg Oral Daily Emokpae, Courage, MD   20 mg at 04/03/17 0913  . ceFEPIme (MAXIPIME) 1 g in dextrose 5 % 50 mL IVPB  1 g Intravenous Q24H Standley Brooking, MD   Stopped at 04/03/17 1503  . cholecalciferol (VITAMIN D) tablet 1,000 Units  1,000 Units Oral q morning - 10a Shon Hale, MD   1,000 Units at 04/03/17 0913  . escitalopram (LEXAPRO) tablet 10 mg  10 mg Oral Daily Emokpae, Courage, MD   10 mg at 04/03/17 0913  . feeding supplement (ENSURE ENLIVE) (ENSURE ENLIVE) liquid 237 mL  237 mL Oral BID BM Emokpae, Courage, MD   237 mL at 04/03/17 1433  . fenofibrate tablet 160 mg  160 mg Oral Daily Emokpae, Courage, MD   160 mg at 04/03/17 0913  . gabapentin (NEURONTIN) capsule 100 mg  100 mg Oral QHS Emokpae, Courage, MD   100 mg at 04/03/17 2206  . guaiFENesin (MUCINEX) 12 hr tablet 600 mg  600 mg Oral BID Mariea Clonts, Courage, MD   600 mg at 04/03/17 2206  . hydrALAZINE (APRESOLINE) tablet 75 mg  75 mg Oral BID Shon Hale, MD   75 mg at 04/03/17 2001  . insulin aspart (novoLOG) injection 0-15 Units  0-15 Units Subcutaneous TID WC Standley Brooking, MD   5 Units at 04/03/17 1700  . insulin aspart (novoLOG) injection 5 Units  5 Units Subcutaneous TID WC Alwyn Ren, MD   5 Units at 04/03/17 1700  . insulin detemir (LEVEMIR) injection 5 Units  5 Units Subcutaneous Daily Standley Brooking, MD   5 Units at 04/03/17 0914  . ipratropium-albuterol (DUONEB) 0.5-2.5 (3) MG/3ML nebulizer solution 3 mL  3 mL Nebulization TID Standley Brooking, MD   3 mL at 04/03/17 2031  . isosorbide mononitrate (IMDUR) 24 hr tablet 30 mg  30 mg Oral Daily Emokpae, Courage, MD   30 mg at 04/03/17 0913  . methimazole (TAPAZOLE) tablet 5 mg  5 mg Oral Daily Glade Lloyd, MD   5 mg at 04/03/17 0913  . metoprolol tartrate (LOPRESSOR) tablet 12.5 mg  12.5 mg Oral BID Lewayne Bunting, MD   12.5 mg at 04/03/17 2206  . multivitamin with  minerals tablet 1 tablet  1 tablet Oral Daily Shon Hale, MD   1 tablet at 04/03/17 0913  . ondansetron (ZOFRAN) tablet 4 mg  4 mg Oral Q6H PRN Shon Hale, MD  Or  . ondansetron (ZOFRAN) injection 4 mg  4 mg Intravenous Q6H PRN Emokpae, Courage, MD      . pantoprazole (PROTONIX) EC tablet 40 mg  40 mg Oral Daily Alwyn Ren, MD   40 mg at 04/03/17 1042  . polyethylene glycol (MIRALAX / GLYCOLAX) packet 17 g  17 g Oral BID Standley Brooking, MD   17 g at 04/03/17 1610  . predniSONE (DELTASONE) tablet 30 mg  30 mg Oral Q breakfast Alwyn Ren, MD      . senna [facility]) tablet 8.6 mg  1 tablet Oral QHS Standley Brooking, MD   8.6 mg at 04/03/17 2206  . sodium chloride flush (NS) 0.9 % injection 3 mL  3 mL Intravenous Q12H Emokpae, Courage, MD   3 mL at 04/03/17 2208  . sodium chloride flush (NS) 0.9 % injection 3 mL  3 mL Intravenous PRN Emokpae, Courage, MD      . traZODone (DESYREL) tablet 50 mg  50 mg Oral QHS PRN Mariea Clonts, Courage, MD   50 mg at 03/31/17 2206  . Warfarin - Pharmacist Dosing Inpatient   Does not apply q1800 Sharlyne Cai, RPH   Stopped at 03/31/17 1800      Allergies: Allergies  Allergen Reactions  . Yellow Dyes (Non-Tartrazine) Nausea And Vomiting    "deathly allergic" No other information given to explain reaction. Per Daughter- this was an IV dye. Ok with yellow coloring on Coumadin tablets.   . Codeine Hives  . Other Other (See Comments)    novocaine broke mouth out  . Penicillins Hives  . Procaine Hives      Past Medical History: Past Medical History:  Diagnosis Date  . Anxiety   . Chronic diastolic CHF (congestive heart failure) (HCC)    a. 05/2015 Echo: EF 55-60%, mild LVH, no rwma, mild MR, mildly dil RA, mod TR, PASP .  . CKD (chronic kidney disease), stage IV (HCC)   . Confusion state   . COPD (chronic obstructive pulmonary disease) (HCC)    a. on home O2 @ 3lpm.  . Elevated troponin    a. H/o elevated trop  in setting of CHF; b. 06/2013 Lexiscan MV: nl EF, mild anterior breast attenuation, no ischemia.   . Family history of adverse reaction to anesthesia   . GIB (gastrointestinal bleeding)    a. History of GIB.  Marland Kitchen Hypertensive heart disease   . Macular degeneration, bilateral   . Myocardial infarction (HCC)   . Neuropathy   . Paroxysmal atrial fibrillation (HCC)    a. CHA2DS2VASc = 6-->coumadin.  Marland Kitchen PONV (postoperative nausea and vomiting)   . Type II diabetes mellitus (HCC)   . Varicose veins      Past Surgical History: Past Surgical History:  Procedure Laterality Date  . ABDOMINAL HYSTERECTOMY    . FRACTURE SURGERY    . HIP FRACTURE SURGERY  2004  . VEIN SURGERY       Family History: Family History  Problem Relation Age of Onset  . Colon cancer Father 14       advanced when discovered, no surgery or therapy.  this led to his death  . Breast cancer Mother   . Hypertension Mother   . Alzheimer's disease Mother   . Breast cancer Sister   . Cancer Brother   . Lung cancer Brother   . Thyroid disease Neg Hx   . Heart attack Neg Hx      Social History: Social  History   Socioeconomic History  . Marital status: Widowed    Spouse name: Not on file  . Number of children: Y  . Years of education: Not on file  . Highest education level: Not on file  Social Needs  . Financial resource strain: Not on file  . Food insecurity - worry: Not on file  . Food insecurity - inability: Not on file  . Transportation needs - medical: Not on file  . Transportation needs - non-medical: Not on file  Occupational History  . Occupation: retired  Tobacco Use  . Smoking status: Passive Smoke Exposure - Never Smoker  . Smokeless tobacco: Never Used  Substance and Sexual Activity  . Alcohol use: No    Alcohol/week: 0.0 oz  . Drug use: No  . Sexual activity: Not on file  Other Topics Concern  . Not on file  Social History Narrative   Originally from [place]. She has always lived in [place] & had  no travel. She has significant smoke exposure through her husband for 60 years. She has no pets currently. No prior bird or mold exposure. 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 She lives in [place] with her son.     Review of Systems: As per HPI  Vital Signs: Blood pressure 131/67, pulse 65, temperature (!) 97.4 F (36.3 C), temperature source Axillary, resp. rate 12, height 5\' 2"  (1.575 m), weight 167 lb (75.8 kg), SpO2 97 %.  Weight trends: Filed Weights   04/02/17 0517 04/03/17 0542 04/04/17 0519  Weight: 156 lb (70.8 kg) 155 lb (70.3 kg) 167 lb (75.8 kg)    Physical Exam: General: Vital signs reviewed and noted. Well-developed, well-nourished, in no acute distress; alert, appropriate and cooperative throughout examination. [place] in place.  Head: Normocephalic, atraumatic.  Neck: Supple, no LAD  Lungs:  Normal respiratory effort. Decreased breath sounds bilaterally   Heart: Irregular. S1 and S2 normal without gallop, murmur, or rubs.  Abdomen:  BS normoactive. Soft, Nondistended, non-tender.  No masses or organomegaly.  Extremities: No pretibial edema. SCDs in place  Neurologic: No focal deficits  Skin: No visible rashes, scars.   Lab results: Basic Metabolic Panel: Recent Labs  Lab 04/02/17 0333 04/03/17 0511 04/04/17 0354  NA 136 136 136  K 5.3* 5.4* 5.4*  CL 100* 100* 100*  CO2 27 27 28   GLUCOSE 292* 179* 207*  BUN 113* 123* 124*  CREATININE 3.02* 2.89* 2.79*  CALCIUM 8.7* 8.6* 8.4*   Liver Function Tests: No results for input(s): AST, ALT, ALKPHOS, BILITOT, PROT, ALBUMIN in the last 168 hours. No results for input(s): LIPASE, AMYLASE in the last 168 hours. No results for input(s): AMMONIA in the last 168 hours.  CBC: Recent Labs  Lab 03/30/17 0245 03/31/17 0431 04/01/17 0647 04/01/17 1901 04/02/17 0333  04/04/17 0354  WBC 8.0 12.4* 10.6*  --  8.4 8.9  NEUTROABS  --   --   --   --   --  7.8*  HGB 7.5* 7.2* 6.8* 8.2* 7.8* 7.5*  HCT 24.8* 23.4* 22.3* 26.0* 25.3* 24.2*  MCV 87.9 87.3 86.8  --  87.5 88.6  PLT 237 265 235  --  235 236    Cardiac Enzymes: No results for input(s): CKTOTAL, CKMB, CKMBINDEX, TROPONINI in the last 168 hours.  BNP: Invalid input(s): POCBNP  CBG: Recent Labs  Lab  04/03/17 0745 04/03/17 1123 04/03/17 1643 04/03/17 2147 04/04/17 0719  GLUCAP 159* 229* 246* 258* 167*    Microbiology: Results for orders placed or performed during the hospital encounter of 03/20/17  Urine Culture     Status: Abnormal   Collection Time: 03/20/17 12:40 PM  Result Value Ref Range Status   Specimen Description URINE, RANDOM  Final   Special Requests NONE  Final   Culture >=100,000 COLONIES/mL ESCHERICHIA COLI (A)  Final   Report Status 03/22/2017 FINAL  Final   Organism ID, Bacteria ESCHERICHIA COLI (A)  Final      Susceptibility   Escherichia coli - MIC*    AMPICILLIN <=2 SENSITIVE Sensitive     CEFAZOLIN <=4 SENSITIVE Sensitive     CEFTRIAXONE <=1 SENSITIVE Sensitive     CIPROFLOXACIN <=0.25 SENSITIVE Sensitive     GENTAMICIN <=1 SENSITIVE Sensitive     IMIPENEM <=0.25 SENSITIVE Sensitive     NITROFURANTOIN 256 RESISTANT Resistant     TRIMETH/SULFA <=20 SENSITIVE Sensitive     AMPICILLIN/SULBACTAM <=2 SENSITIVE Sensitive     PIP/TAZO <=4 SENSITIVE Sensitive     Extended ESBL NEGATIVE Sensitive     * >=100,000 COLONIES/mL ESCHERICHIA COLI  Culture, blood (routine x 2) Call MD if unable to obtain prior to antibiotics being given     Status: None   Collection Time: 03/29/17  2:55 PM  Result Value Ref Range Status   Specimen Description BLOOD LEFT ANTECUBITAL  Final   Special Requests IN PEDIATRIC BOTTLE Blood Culture adequate volume  Final   Culture NO GROWTH 5 DAYS  Final   Report Status 04/03/2017 FINAL  Final  Culture, blood (routine x 2) Call MD if unable to  obtain prior to antibiotics being given     Status: None   Collection Time: 03/29/17  3:02 PM  Result Value Ref Range Status   Specimen Description BLOOD LEFT HAND  Final   Special Requests   Final    BOTTLES DRAWN AEROBIC ONLY Blood Culture adequate volume   Culture NO GROWTH 5 DAYS  Final   Report Status 04/03/2017 FINAL  Final  MRSA PCR Screening     Status: None   Collection Time: 03/30/17  7:57 AM  Result Value Ref Range Status   MRSA by PCR NEGATIVE NEGATIVE Final    Comment:        The GeneXpert MRSA Assay (FDA approved for NASAL specimens only), is one component of a comprehensive MRSA colonization surveillance program. It is not intended to diagnose MRSA infection nor to guide or monitor treatment for MRSA infections.     Coagulation Studies: Recent Labs    04/02/17 0333 04/03/17 0511 04/04/17 0354  LABPROT 25.3* 19.0* 20.9*  INR 2.32 1.61 1.82    Urinalysis: No results for input(s): COLORURINE, LABSPEC, PHURINE, GLUCOSEU, HGBUR, BILIRUBINUR, KETONESUR, PROTEINUR, UROBILINOGEN, NITRITE, LEUKOCYTESUR in the last 72 hours.  Invalid input(s): APPERANCEUR    Imaging: Dg Chest Port 1 View  Result Date: 04/02/2017 CLINICAL DATA:  Lower extremity soft tissue edema EXAM: PORTABLE CHEST 1 VIEW COMPARISON:  March 29, 2017 FINDINGS: There are bilateral pleural effusions. There is interstitial and alveolar edema bilaterally. There is airspace consolidation in the left lower lobe. There is cardiomegaly with pulmonary venous hypertension. There is aortic atherosclerosis. No evident adenopathy. There are old healed rib fractures on the left. IMPRESSION: Findings which are felt to be radiographically indicative of congestive heart failure. There is questionable superimposed pneumonia left lower lobe. Cardiomegaly is stable. There  is aortic atherosclerosis. Old healed rib fractures on left. Aortic Atherosclerosis (ICD10-I70.0). Electronically Signed   By: Bretta Bang III  M.D.   On: 04/02/2017 16:30      Assessment & Plan: Pt is a 83 y.o. yo female with a PMHX of COPD on oxygen, T2DM, afib, CKDIV, HTN, anemia, diastolic CHF and PVD, was admitted to [facility] on 03/20/2017 with CHF exacerbation.   1. Acute on chronic kidney disease- baseline appears to be 1.7-2.0, creatinine has increased since being hospitalized with IV diuretics for CHF exacerbation. When diuretics are held creatinine decreases. Patient with good urine output. BUN increased but patient on steroids for COPD exacerbation. No signs of uremia. Potassium slightly elevated but not at dangerous level. Patient is net negative 2.5L overall. She appears euvolemic. This may be a new baseline for her. Renal US shows chronic renal disease, corticol atrophy and scarring, no obstruction of hydronephrosis -Agree with holding diuretics -continue to monitor fluid status closely -will follow with her as outpatient at time of discharge -monitor renal function -avoid NSAIDs, ACEi  2. Hyperkalemia- potassium 5.3-5.4 over past 3 days -low potassium diet, follow potassium. No ACEi  3. CHF- euvoluemic, cardiology following, continue metop  4. COPD- per primary, currently on 30 mg prednisone- taper down, this is likely contributing to elevated BUN. Supplemental oxygen as needed   5. T2DM- per primary  6. Anemia- continue to monitor closely  7. Afib- on coumadin- continue anticoagulation   DVT PPX - on coumadin  Dispo: pending, SNF vs home w/ HH  Dolores Patty, DO PGY-2, [facility] 04/04/2017 8:32 AM

## 2017-04-04 NOTE — Progress Notes (Signed)
  Speech Language Pathology Treatment: Dysphagia  Patient Details Name: Alexis [name] MRN: [id number] DOB: 1932-01-29 Today's Date: 04/04/2017 Time: 7044-9252 SLP Time Calculation (min) (ACUTE ONLY): 16 min  Assessment / Plan / Recommendation Clinical Impression  Session focused on education following MBS with pt and daughter. Verbal and visual education provided with review of video with explanation of results and recommendation. Reviewed esophageal precautions with pt and family and advised to follow up with GI MD if esophageal symptoms arise or worsen Pt desires to remain on Dys 2 (minced) diet while in hospital. No further ST needed.    HPI HPI: [name] a68 y.o.female,with past medical history relevant for diastolic dysfunction CHF, COPD on 2-3 L of oxygen at home, DM, MI admitted with a 2 to three-day history of worsening shortness of breath, orthopnea and increased oxygen requirement.CXR 11/3 findings are similar to the most recent prior exam with bilateral patchy airspace lung opacities. Name opacity at the lung bases is likely due to the development of small pleural effusions. Findings are consistent with either asymmetric pulmonary edema or multifocal pneumonia. MBS 02/28/16 functional swallow, slight oral holding; barium pill hesitated mid esophagus and transited iwth additional puree. Reg/thin recommended. Given family reports of frequent coughing with/after meals and COPD, proceed with MBS.       SLP Plan  All goals met;Discharge SLP treatment due to (comment)       Recommendations  Diet recommendations: Dysphagia 2 (fine chop);Thin liquid Liquids provided via: Cup;Straw Medication Administration: Whole meds with puree Supervision: Patient able to self feed;Intermittent supervision to cue for compensatory strategies Compensations: Slow rate;Small sips/bites Postural Changes and/or Swallow Maneuvers: Seated upright 90 degrees;Upright 30-60 min after meal                 Oral Care Recommendations: Oral care BID Follow up Recommendations: None SLP Visit Diagnosis: Dysphagia, unspecified (R13.10) Plan: All goals met;Discharge SLP treatment due to (comment)       GO                Alexis [name] 04/04/2017, 4:20 PM   Alexis [name] Alexis [name].Ed [employer] [phone]

## 2017-04-04 NOTE — Consult Note (Signed)
[facility] Gastroenterology Consultation Note  Referring Provider:  Dr. Jerelene Redden Primary Care Physician:  [name], PA-C  Reason for Consultation:  Anemia, hemoccult positive stools  HPI: Alexis [name] is a 83 y.o. female with numerous cardiopulmonary medical problems admitted about two weeks ago with heart failure exacerbation.  She is on home oxygen and chronic anticoagulation.  We were asked to see patient for anemia and hemoccult positive stools.  Had colonoscopy (?) over 10 years ago, results unknown.  Dr. Marina Goodell saw patient in consultation in July 2016 for anemia and hemoccult positive stools; endoscopy at that time showed no obvious source for anemia and heme-positive stools.  Patient denies nausea, vomiting, hematemesis, melena, hematochezia.  Denies abdominal pain.   Past Medical History:  Diagnosis Date  . Anxiety   . Chronic diastolic CHF (congestive heart failure) (HCC)    a. 05/2015 Echo: EF 55-60%, mild LVH, no rwma, mild MR, mildly dil RA, mod TR, PASP .  . CKD (chronic kidney disease), stage IV (HCC)   . Confusion state   . COPD (chronic obstructive pulmonary disease) (HCC)    a. on home O2 @ 3lpm.  . Elevated troponin    a. H/o elevated trop in setting of CHF; b. 06/2013 Lexiscan MV: nl EF, mild anterior breast attenuation, no ischemia.   . Family history of adverse reaction to anesthesia   . GIB (gastrointestinal bleeding)    a. History of GIB.  Marland Kitchen Hypertensive heart disease   . Macular degeneration, bilateral   . Myocardial infarction (HCC)   . Neuropathy   . Paroxysmal atrial fibrillation (HCC)    a. CHA2DS2VASc = 6-->coumadin.  Marland Kitchen PONV (postoperative nausea and vomiting)   . Type II diabetes mellitus (HCC)   . Varicose veins     Past Surgical History:  Procedure Laterality Date  . ABDOMINAL HYSTERECTOMY    . FRACTURE SURGERY    . HIP FRACTURE SURGERY  2004  . VEIN SURGERY      Prior to Admission medications   Medication Sig Start Date  End Date Taking? Authorizing Provider  albuterol (PROVENTIL HFA;VENTOLIN HFA) 108 (90 BASE) MCG/ACT inhaler Inhale 2 puffs into the lungs every 6 (six) hours as needed for wheezing or shortness of breath. 06/11/13  Yes Dhungel, Nishant, MD  atorvastatin (LIPITOR) 20 MG tablet Take 20 mg by mouth daily.   Yes [provider]  cholecalciferol (VITAMIN D) 1000 UNITS tablet Take 1,000 Units by mouth every morning.    Yes [provider]  docusate sodium (COLACE) 100 MG capsule Take 100 mg by mouth 2 (two) times daily.    Yes [provider]  escitalopram (LEXAPRO) 10 MG tablet Take 10 mg by mouth daily.   Yes [provider]  fenofibrate 160 MG tablet Take 160 mg by mouth daily.   Yes [provider]  gabapentin (NEURONTIN) 100 MG capsule Take 1 capsule (100 mg total) by mouth at bedtime. 08/05/15  Yes Rodolph Bong, MD  hydrALAZINE (APRESOLINE) 50 MG tablet Take 1.5 tablets (75 mg total) by mouth 2 (two) times daily. 02/15/17  Yes Deneise Lever, MD  isosorbide mononitrate (IMDUR) 30 MG 24 hr tablet TAKE 1 TABLET BY MOUTH EVERY DAY 07/29/16  Yes Hilty, Lisette Abu, MD  methimazole (TAPAZOLE) 5 MG tablet Take 5 mg by mouth daily. 03/18/17  Yes [provider]  metoprolol tartrate (LOPRESSOR) 25 MG tablet Take 0.5 tablets (12.5 mg total) by mouth 2 (two) times daily. 12/30/16  Yes Hilty,  Lisette Abu, MD  OXYGEN Inhale 3 L into the lungs at bedtime.    Yes [provider]  polyethylene glycol (MIRALAX / GLYCOLAX) packet Take 17 g by mouth daily as needed for mild constipation.   Yes [provider]  potassium chloride SA (KLOR-CON M20) 20 MEQ tablet Take 1 tablet (20 mEq total) by mouth daily. 05/22/16  Yes Hilty, Lisette Abu, MD  torsemide (DEMADEX) 20 MG tablet Take 2 tablets (40 mg total) by mouth daily. 05/21/16  Yes Hilty, Lisette Abu, MD  warfarin (COUMADIN) 4 MG tablet TAKE 1 TO 1&1/2 TABLETS BY MOUTH AS DIRECTED BY COUMADIN  CLINIC Patient taking differently: TAKE 4MG  BY MOUTH DAILY EXCEPT MONDAYS TAKE 6MG  03/13/17  Yes Hilty, Lisette Abu, MD    Current Facility-Administered Medications  Medication Dose Route Frequency Provider Last Rate Last Dose  . 0.9 %  sodium chloride infusion  250 mL Intravenous PRN Shon Hale, MD   Stopped at 03/29/17 0150  . acetaminophen (TYLENOL) tablet 650 mg  650 mg Oral Q6H PRN Shon Hale, MD   650 mg at 03/29/17 0437   Or  . acetaminophen (TYLENOL) suppository 650 mg  650 mg Rectal Q6H PRN Emokpae, Courage, MD      . albuterol (PROVENTIL) (2.5 MG/3ML) 0.083% nebulizer solution 2.5 mg  2.5 mg Nebulization Q2H PRN Emokpae, Courage, MD   2.5 mg at 03/29/17 1227  . atorvastatin (LIPITOR) tablet 20 mg  20 mg Oral Daily Emokpae, Courage, MD   20 mg at 04/04/17 0834  . ceFEPIme (MAXIPIME) 1 g in dextrose 5 % 50 mL IVPB  1 g Intravenous Q24H Standley Brooking, MD   Stopped at 04/03/17 1503  . cholecalciferol (VITAMIN D) tablet 1,000 Units  1,000 Units Oral q morning - 10a Shon Hale, MD   1,000 Units at 04/04/17 0834  . escitalopram (LEXAPRO) tablet 10 mg  10 mg Oral Daily Mariea Clonts, Courage, MD   10 mg at 04/04/17 0835  . feeding supplement (ENSURE ENLIVE) (ENSURE ENLIVE) liquid 237 mL  237 mL Oral BID BM Emokpae, Courage, MD   237 mL at 04/03/17 1433  . fenofibrate tablet 160 mg  160 mg Oral Daily Emokpae, Courage, MD   160 mg at 04/04/17 0834  . gabapentin (NEURONTIN) capsule 100 mg  100 mg Oral QHS Emokpae, Courage, MD   100 mg at 04/03/17 2206  . guaiFENesin (MUCINEX) 12 hr tablet 600 mg  600 mg Oral BID Emokpae, Courage, MD   600 mg at 04/04/17 0835  . hydrALAZINE (APRESOLINE) tablet 75 mg  75 mg Oral BID Shon Hale, MD   75 mg at 04/04/17 0834  . insulin aspart (novoLOG) injection 0-15 Units  0-15 Units Subcutaneous TID WC Standley Brooking, MD   3 Units at 04/04/17 [phone]  . insulin aspart (novoLOG) injection 5 Units  5 Units Subcutaneous TID WC Alwyn Ren, MD   5 Units at 04/04/17 [phone]  . insulin detemir (LEVEMIR) injection 5 Units  5 Units Subcutaneous Daily Standley Brooking, MD   5 Units at 04/04/17 [phone]  . ipratropium-albuterol (DUONEB) 0.5-2.5 (3) MG/3ML nebulizer solution 3 mL  3 mL Nebulization TID Standley Brooking, MD   3 mL at 04/04/17 1039  . isosorbide mononitrate (IMDUR) 24 hr tablet 30 mg  30 mg Oral Daily Emokpae, Courage, MD   30 mg at 04/04/17 0835  . methimazole (TAPAZOLE) tablet 5 mg  5 mg Oral Daily Glade Lloyd, MD   5 mg  at 04/04/17 0834  . metoprolol tartrate (LOPRESSOR) tablet 12.5 mg  12.5 mg Oral BID Lewayne Buntingrenshaw, Brian S, MD   12.5 mg at 04/04/17 0834  . multivitamin with minerals tablet 1 tablet  1 tablet Oral Daily Shon HaleEmokpae, Courage, MD   1 tablet at 04/04/17 0834  . ondansetron (ZOFRAN) tablet 4 mg  4 mg Oral Q6H PRN Emokpae, Courage, MD       Or  . ondansetron (ZOFRAN) injection 4 mg  4 mg Intravenous Q6H PRN Emokpae, Courage, MD      . pantoprazole (PROTONIX) EC tablet 40 mg  40 mg Oral Daily Alwyn RenMathews, Elizabeth G, MD   40 mg at 04/04/17 0835  . polyethylene glycol (MIRALAX / GLYCOLAX) packet 17 g  17 g Oral BID Standley BrookingGoodrich, Daniel P, MD   17 g at 04/04/17 [phone]  . predniSONE (DELTASONE) tablet 30 mg  30 mg Oral Q breakfast Alwyn RenMathews, Elizabeth G, MD   30 mg at 04/04/17 0835  . senna (SENOKOT) tablet 8.6 mg  1 tablet Oral QHS Standley BrookingGoodrich, Daniel P, MD   8.6 mg at 04/03/17 2206  . sodium chloride flush (NS) 0.9 % injection 3 mL  3 mL Intravenous Q12H Emokpae, Courage, MD   3 mL at 04/04/17 0836  . sodium chloride flush (NS) 0.9 % injection 3 mL  3 mL Intravenous PRN Emokpae, Courage, MD      . traZODone (DESYREL) tablet 50 mg  50 mg Oral QHS PRN Mariea ClontsEmokpae, Courage, MD   50 mg at 03/31/17 2206  . Warfarin - Pharmacist Dosing Inpatient   Does not apply q1800 Bajbus, Eliott NineLauren D, RPH   Stopped at 03/31/17 1800    Allergies as of 03/20/2017 - Review Complete 03/20/2017  Allergen Reaction Noted  . Yellow dyes (non-tartrazine) Nausea  And Vomiting 07/21/2013  . Codeine Hives 06/09/2013  . Other Other (See Comments) 06/09/2013  . Penicillins Hives 06/09/2013  . Procaine Hives 07/15/2013    Family History  Problem Relation Age of Onset  . Colon cancer Father 1980       advanced when discovered, no surgery or therapy.  this led to his death  . Breast cancer Mother   . Hypertension Mother   . Alzheimer's disease Mother   . Breast cancer Sister   . Cancer Brother   . Lung cancer Brother   . Thyroid disease Neg Hx   . Heart attack Neg Hx     Social History   Socioeconomic History  . Marital status: Widowed    Spouse name: Not on file  . Number of children: Y  . Years of education: Not on file  . Highest education level: Not on file  Social Needs  . Financial resource strain: Not on file  . Food insecurity - worry: Not on file  . Food insecurity - inability: Not on file  . Transportation needs - medical: Not on file  . Transportation needs - non-medical: Not on file  Occupational History  . Occupation: retired  Tobacco Use  . Smoking status: Passive Smoke Exposure - Never Smoker  . Smokeless tobacco: Never Used  Substance and Sexual Activity  . Alcohol use: No    Alcohol/week: 0.0 oz  . Drug use: No  . Sexual activity: Not on file  Other Topics Concern  . Not on file  Social History Narrative   Originally from [place]. She has always lived in [place] & had no travel. She has significant smoke exposure through her husband for 60 years.  She has no pets currently. No prior bird or mold exposure. 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 She lives in [place] with her son.    Review of Systems: As per HPI, all others negative  Physical Exam: Vital signs in last 24 hours: Temp:  [97.4 F (36.3 C)-99.1 F (37.3 C)] 97.4 F (36.3 C) (11/09 0519) Pulse Rate:  [65-87] 65  (11/09 1039) Resp:  [12-18] 18 (11/09 1039) BP: (117-141)/(48-67) 131/67 (11/09 0519) SpO2:  [93 %-98 %] 98 % (11/09 1039) FiO2 (%):  [30 %] 30 % (11/08 2031) Weight:  [167 lb (75.8 kg)] 167 lb (75.8 kg) (11/09 0519) Last BM Date: 04/03/17 General:   Tachypnea at rest, chronically ill- and frail-appearing Head:  Normocephalic and atraumatic. Eyes:  Sclera clear, no icterus.   Conjunctiva pink. Ears:  Normal auditory acuity. Nose:  No deformity, discharge,  or lesions. Mouth:  No deformity or lesions.  Oropharynx pink & moist. Neck:  +JVD; thin but supple; no masses or thyromegaly. Lungs:  Clear throughout to auscultation.   No wheezes, crackles, or rhonchi. No acute distress. Heart:  Scaphoid chest; irregular heart beats; no murmurs, clicks, rubs,  or gallops. Abdomen:  Soft, nontender and nondistended. No masses, hepatosplenomegaly or hernias noted. Normal bowel sounds, without guarding, and without rebound.     Msk:  Symmetrical without gross deformities. Normal posture. Pulses:  Normal pulses noted. Extremities:  SCDs in place, without obvious clubbing or edema. Neurologic:  Alert and  oriented x4; diffusely weak Skin:  Intact without significant lesions or rashes. Cervical Nodes:  No significant cervical adenopathy. Psych:  Alert, not appreciably interactive, curt with questions. Flat affect   Lab Results: Recent Labs    04/01/17 1901 04/02/17 0333 04/04/17 0354  WBC  --  8.4 8.9  HGB 8.2* 7.8* 7.5*  HCT 26.0* 25.3* 24.2*  PLT  --  235 236   BMET Recent Labs    04/02/17 0333 04/03/17 0511 04/04/17 0354  NA 136 136 136  K 5.3* 5.4* 5.4*  CL 100* 100* 100*  CO2 27 27 28   GLUCOSE 292* 179* 207*  BUN 113* 123* 124*  CREATININE 3.02* 2.89* 2.79*  CALCIUM 8.7* 8.6* 8.4*   LFT No results for input(s): PROT, ALBUMIN, AST, ALT, ALKPHOS, BILITOT, BILIDIR, IBILI in the last 72 hours. PT/INR Recent Labs    04/03/17 0511 04/04/17 0354  LABPROT 19.0* 20.9*  INR 1.61  1.82    Studies/Results: Dg Chest Port 1 View  Result Date: 04/02/2017 CLINICAL DATA:  Lower extremity soft tissue edema EXAM: PORTABLE CHEST 1 VIEW COMPARISON:  March 29, 2017 FINDINGS: There are bilateral pleural effusions. There is interstitial and alveolar edema bilaterally. There is airspace consolidation in the left lower lobe. There is cardiomegaly with pulmonary venous hypertension. There is aortic atherosclerosis. No evident adenopathy. There are old healed rib fractures on the left. IMPRESSION: Findings which are felt to be radiographically indicative of congestive heart failure. There is questionable superimposed pneumonia left lower lobe. Cardiomegaly is stable. There is aortic atherosclerosis. Old healed rib fractures on left. Aortic Atherosclerosis (ICD10-I70.0). Electronically Signed   By: Bretta Bang III M.D.   On: 04/02/2017 16:30   Dg Swallowing Func-speech Pathology  Result Date: 04/04/2017 Objective Swallowing Evaluation: Type of Study: MBS-Modified Barium Swallow Study  Patient Details Name: DONALDA JOB MRN: [id number] Date of  Birth: [birth year] Today's Date: 04/04/2017 Time: SLP Start Time (ACUTE ONLY): 4098 -SLP Stop Time (ACUTE ONLY): 0918 SLP Time Calculation (min) (ACUTE ONLY): 15 min Past Medical History: Past Medical History: Diagnosis Date . Anxiety  . Chronic diastolic CHF (congestive heart failure) (HCC)   a. 05/2015 Echo: EF 55-60%, mild LVH, no rwma, mild MR, mildly dil RA, mod TR, PASP . . CKD (chronic kidney disease), stage IV (HCC)  . Confusion state  . COPD (chronic obstructive pulmonary disease) (HCC)   a. on home O2 @ 3lpm. . Elevated troponin   a. H/o elevated trop in setting of CHF; b. 06/2013 Lexiscan MV: nl EF, mild anterior breast attenuation, no ischemia.  . Family history of adverse reaction to anesthesia  . GIB (gastrointestinal bleeding)   a. History of GIB. Marland Kitchen Hypertensive heart disease  . Macular degeneration, bilateral  . Myocardial  infarction (HCC)  . Neuropathy  . Paroxysmal atrial fibrillation (HCC)   a. CHA2DS2VASc = 6-->coumadin. Marland Kitchen PONV (postoperative nausea and vomiting)  . Type II diabetes mellitus (HCC)  . Varicose veins  Past Surgical History: Past Surgical History: Procedure Laterality Date . ABDOMINAL HYSTERECTOMY   . FRACTURE SURGERY   . HIP FRACTURE SURGERY  2004 . VEIN SURGERY   HPI: [name] a26 y.o.female,with past medical history relevant for diastolic dysfunction CHF, COPD on 2-3 L of oxygen at home, DM, MI admitted with a 2 to three-day history of worsening shortness of breath, orthopnea and increased oxygen requirement.CXR 11/3 findings are similar to the most recent prior exam with bilateral patchy airspace lung opacities. Name opacity at the lung bases is likely due to the development of small pleural effusions. Findings are consistent with either asymmetric pulmonary edema or multifocal pneumonia. MBS 02/28/16 functional swallow, slight oral holding; barium pill hesitated mid esophagus and transited iwth additional puree. Reg/thin recommended. Given family reports of frequent coughing with/after meals and COPD, proceed with MBS.  No Data Recorded Assessment / Plan / Recommendation CHL IP CLINICAL IMPRESSIONS 04/04/2017 Clinical Impression MBS results same per documentation as prior study 02/2016. No significant impairments with oral control, manipulation or transit. Pharyngeal phase intact with timely initiation of protective mechanisms, hyolaryngeal excursion and elevation and epiglottic deflection. No pharyngeal residue. Pill observed to stop in mid esophagus with sudden retrograde movment of barium close to pharynx requiring puree to assist in transit through GE junction (thin barium ineffective). Pt prefers to remain on Dys 2 texture; recommend continue thin liquids and bite puree following pills to ensure transit through esophagus. Esophageal precautions (remain upright after meals 45 min to 1 hour,  smaller meals more frequently, elevate head at night). No further ST needed.   SLP Visit Diagnosis Dysphagia, unspecified (R13.10) Attention and concentration deficit following -- Frontal lobe and executive function deficit following -- Impact on safety and function (No Data)   CHL IP TREATMENT RECOMMENDATION 04/04/2017 Treatment Recommendations No treatment recommended at this time   Prognosis 03/31/2017 Prognosis for Safe Diet Advancement Good Barriers to Reach Goals -- Barriers/Prognosis Comment -- CHL IP DIET RECOMMENDATION 04/04/2017 SLP Diet Recommendations Dysphagia 2 (Fine chop) solids;Thin liquid Liquid Administration via Cup;Straw Medication Administration Whole meds with liquid Compensations Slow rate;Small sips/bites Postural Changes Seated upright at 90 degrees;Remain semi-upright after after feeds/meals (Comment)   CHL IP OTHER RECOMMENDATIONS 04/04/2017 Recommended Consults -- Oral Care Recommendations Oral care BID Other Recommendations --   CHL IP FOLLOW UP RECOMMENDATIONS 04/04/2017 Follow up Recommendations None   CHL IP FREQUENCY AND DURATION 03/31/2017 Speech Therapy  Frequency (ACUTE ONLY) min 2x/week Treatment Duration 2 weeks      CHL IP ORAL PHASE 04/04/2017 Oral Phase WFL Oral - Pudding Teaspoon -- Oral - Pudding Cup -- Oral - Honey Teaspoon -- Oral - Honey Cup -- Oral - Nectar Teaspoon -- Oral - Nectar Cup -- Oral - Nectar Straw -- Oral - Thin Teaspoon -- Oral - Thin Cup -- Oral - Thin Straw -- Oral - Puree -- Oral - Mech Soft -- Oral - Regular -- Oral - Multi-Consistency -- Oral - Pill -- Oral Phase - Comment --  CHL IP PHARYNGEAL PHASE 04/04/2017 Pharyngeal Phase WFL Pharyngeal- Pudding Teaspoon -- Pharyngeal -- Pharyngeal- Pudding Cup -- Pharyngeal -- Pharyngeal- Honey Teaspoon -- Pharyngeal -- Pharyngeal- Honey Cup -- Pharyngeal -- Pharyngeal- Nectar Teaspoon -- Pharyngeal -- Pharyngeal- Nectar Cup -- Pharyngeal -- Pharyngeal- Nectar Straw -- Pharyngeal -- Pharyngeal- Thin Teaspoon --  Pharyngeal -- Pharyngeal- Thin Cup -- Pharyngeal -- Pharyngeal- Thin Straw -- Pharyngeal -- Pharyngeal- Puree -- Pharyngeal -- Pharyngeal- Mechanical Soft -- Pharyngeal -- Pharyngeal- Regular -- Pharyngeal -- Pharyngeal- Multi-consistency -- Pharyngeal -- Pharyngeal- Pill -- Pharyngeal -- Pharyngeal Comment --  CHL IP CERVICAL ESOPHAGEAL PHASE 04/04/2017 Cervical Esophageal Phase WFL Pudding Teaspoon -- Pudding Cup -- Honey Teaspoon -- Honey Cup -- Nectar Teaspoon -- Nectar Cup -- Nectar Straw -- Thin Teaspoon -- Thin Cup -- Thin Straw -- Puree -- Mechanical Soft -- Regular -- Multi-consistency -- Pill -- Cervical Esophageal Comment -- No flowsheet data found. Royce Macadamia 04/04/2017, 10:38 AM Breck Coons Lonell Face.Ed CCC-SLP Pager [phone]              Impression:  1.  Multiple cardiopulmonary medical problems. 2.  Chronic anticoagulation. 3.  Anemia, acute on chronic. 4.  Hemoccult-positive stools.  Plan:  1.  Patient had endoscopy for this same indication in July 2016 by Dr. Marina Goodell which was unrevealing.  I don't think there is any compelling need to repeat this. 2.  Would suggest, so long as patient remains on warfarin (which would seem to be indefinitely), that patient take PPI (pantoprazole 40 mg po qd, or equivalent) indefinitely. Might want to consider oral iron, understanding this will darken stool. 3.  Patient is not now, nor will she ever be, a candidate for colonoscopy.  Even should she have a colonic malignancy or some other process for which one might consider surgery, I don't think she is now nor will she ever be a candidate for any type of abdominal operation that wasn't done for absolute emergency indications. 4.  No plans for any GI tract evaluation, now or at any time in the future, in absence of absolute life-threatening emergency circumstances. 5.  [facility] will sign-off; please call with questions; thank you for the consultation.   LOS: 15 days   Rigby Leonhardt M   04/04/2017, 10:46 AM  Cell [phone] If no answer or after 5 PM call [phone]

## 2017-04-04 NOTE — Progress Notes (Signed)
Progress Note  Patient Name: Alexis [name] Date of Encounter: 04/04/2017  Primary Cardiologist: Dr [name]  Subjective   No chest pain or dyspnea  Inpatient Medications    Scheduled Meds: . atorvastatin  20 mg Oral Daily  . cholecalciferol  1,000 Units Oral q morning - 10a  . escitalopram  10 mg Oral Daily  . feeding supplement (ENSURE ENLIVE)  237 mL Oral BID BM  . fenofibrate  160 mg Oral Daily  . gabapentin  100 mg Oral QHS  . guaiFENesin  600 mg Oral BID  . hydrALAZINE  75 mg Oral BID  . insulin aspart  0-15 Units Subcutaneous TID WC  . insulin aspart  5 Units Subcutaneous TID WC  . insulin detemir  5 Units Subcutaneous Daily  . ipratropium-albuterol  3 mL Nebulization TID  . isosorbide mononitrate  30 mg Oral Daily  . methimazole  5 mg Oral Daily  . metoprolol tartrate  12.5 mg Oral BID  . multivitamin with minerals  1 tablet Oral Daily  . pantoprazole  40 mg Oral Daily  . polyethylene glycol  17 g Oral BID  . predniSONE  30 mg Oral Q breakfast  . senna  1 tablet Oral QHS  . sodium chloride flush  3 mL Intravenous Q12H  . Warfarin - Pharmacist Dosing Inpatient   Does not apply q1800   Continuous Infusions: . sodium chloride Stopped (03/29/17 0150)  . ceFEPime (MAXIPIME) IV Stopped (04/03/17 1503)   PRN Meds: sodium chloride, acetaminophen **OR** acetaminophen, albuterol, ondansetron **OR** ondansetron (ZOFRAN) IV, sodium chloride flush, traZODone   Vital Signs    Vitals:   04/03/17 2137 04/03/17 2206 04/04/17 0149 04/04/17 0519  BP: (!) 117/51   131/67  Pulse: 85 87 76 65  Resp: 16  12 12   Temp: 99.1 F (37.3 C)   (!) 97.4 F (36.3 C)  TempSrc: Axillary   Axillary  SpO2: 95%  93% 97%  Weight:    167 lb (75.8 kg)  Height:        Intake/Output Summary (Last 24 hours) at 04/04/2017 1027 Last data filed at 04/04/2017 0500 Gross per 24 hour  Intake 3 ml  Output 1000 ml  Net -997 ml   Filed Weights   04/02/17 0517 04/03/17 0542 04/04/17 0519    Weight: 156 lb (70.8 kg) 155 lb (70.3 kg) 167 lb (75.8 kg)    Telemetry    Atrial fibrillation rate controlled- Personally Reviewed   Physical Exam   GEN: WD chronically ill appearing NAD Neck: No JVE Cardiac: irregular Respiratory: mildly diminished BS throughout; no wheeze GI: soft NT/ND MS: no edema Neuro:  grossly intact   Labs    Chemistry Recent Labs  Lab 04/02/17 0333 04/03/17 0511 04/04/17 0354  NA 136 136 136  K 5.3* 5.4* 5.4*  CL 100* 100* 100*  CO2 27 27 28   GLUCOSE 292* 179* 207*  BUN 113* 123* 124*  CREATININE 3.02* 2.89* 2.79*  CALCIUM 8.7* 8.6* 8.4*  GFRNONAA 13* 14* 15*  GFRAA 15* 16* 17*  ANIONGAP 9 9 8      Hematology Recent Labs  Lab 04/01/17 0647 04/01/17 1901 04/02/17 0333 04/04/17 0354  WBC 10.6*  --  8.4 8.9  RBC 2.57*  --  2.89* 2.73*  HGB 6.8* 8.2* 7.8* 7.5*  HCT 22.3* 26.0* 25.3* 24.2*  MCV 86.8  --  87.5 88.6  MCH 26.5  --  27.0 27.5  MCHC 30.5  --  30.8 31.0  RDW  16.8*  --  16.1* 16.6*  PLT 235  --  235 236     Cardiac Studies   Echocardiogram 03/21/17: - Left ventricle: The cavity size was normal. Wall thickness was   normal. Systolic function was vigorous. The estimated ejection   fraction was in the range of 65% to 70%. Wall motion was normal;   there were no regional wall motion abnormalities. Features are   consistent with a pseudonormal left ventricular filling pattern,   with concomitant abnormal relaxation and increased filling   pressure (grade 2 diastolic dysfunction). - Aortic valve: Trileaflet; moderately thickened, moderately   calcified leaflets. There was mild stenosis. Peak velocity (S):   276 cm/s. Mean gradient (S): 15 mm Hg. Valve area (VTI): 1.69   cm^2. Valve area (Vmax): 1.76 cm^2. Valve area (Vmean): 1.64   cm^2. - Mitral valve: Moderately calcified annulus. Mildly thickened   leaflets . There was moderate regurgitation. - Left atrium: The atrium was moderately to severely dilated.    Volume/bsa, S: 49.5 ml/m^2. - Right atrium: The atrium was moderately dilated. - Tricuspid valve: There was moderate regurgitation. - Pulmonary arteries: Systolic pressure was severely increased. PA   peak pressure: 70 mm Hg (S).  Impressions:  - Compared to the prior study, there has been no significant   interval change.  Patient Profile     83 y.o. female admitted with acute on chronic diastolic heart failure, increased shortness of breath, fatigue  Assessment & Plan    Acute on chronic diastolic heart failure -Renal function leveling off; elevated BUN - may be contribution from steroids; not volume overloaded; continue to hold diuretics.  COPD exacerbation - improved; per IM  Paroxysmal atrial fibrillation -CHADSVASc score 6.   -Continue coumadin and metoprolol; rate is controlled.  Mild aortic stenosis/Moderate MR -Will need fu echos in future; conservative measures given age and overall medical condition.  For questions or updates, please contact [facility] Please consult [email or web address] for contact info under Cardiology/STEMI.      Signed, Olga MillersBrian Crenshaw, MD  04/04/2017, 10:27 AM

## 2017-04-05 ENCOUNTER — Other Ambulatory Visit: Payer: Self-pay | Admitting: Internal Medicine

## 2017-04-05 DIAGNOSIS — I481 Persistent atrial fibrillation: Secondary | ICD-10-CM

## 2017-04-05 LAB — GLUCOSE, CAPILLARY
GLUCOSE-CAPILLARY: 241 mg/dL — AB (ref 65–99)
GLUCOSE-CAPILLARY: 363 mg/dL — AB (ref 65–99)
Glucose-Capillary: 134 mg/dL — ABNORMAL HIGH (ref 65–99)
Glucose-Capillary: 220 mg/dL — ABNORMAL HIGH (ref 65–99)

## 2017-04-05 LAB — PROTIME-INR
INR: 2.38
PROTHROMBIN TIME: 25.8 s — AB (ref 11.4–15.2)

## 2017-04-05 LAB — BASIC METABOLIC PANEL
ANION GAP: 7 (ref 5–15)
BUN: 116 mg/dL — ABNORMAL HIGH (ref 6–20)
CO2: 27 mmol/L (ref 22–32)
Calcium: 8.3 mg/dL — ABNORMAL LOW (ref 8.9–10.3)
Chloride: 105 mmol/L (ref 101–111)
Creatinine, Ser: 2.71 mg/dL — ABNORMAL HIGH (ref 0.44–1.00)
GFR calc Af Amer: 18 mL/min — ABNORMAL LOW (ref >60)
GFR calc non Af Amer: 15 mL/min — ABNORMAL LOW (ref >60)
GLUCOSE: 150 mg/dL — AB (ref 65–99)
POTASSIUM: 4.8 mmol/L (ref 3.5–5.1)
Sodium: 139 mmol/L (ref 135–145)

## 2017-04-05 MED ORDER — WARFARIN SODIUM 4 MG PO TABS
4.0000 mg | ORAL_TABLET | Freq: Once | ORAL | Status: AC
  Administered 2017-04-05: 4 mg via ORAL
  Filled 2017-04-05: qty 1

## 2017-04-05 NOTE — Progress Notes (Signed)
PROGRESS NOTE    BASIL BLAKESLEY  ZOX:[id number] DOB: 02-06-1932 DOA: 03/20/2017 PCP: [name], PA-C  Brief Narrative: 83 year old woman PMH diastolic heart failure, oxygen dependent COPD presented with increasing shortness of breath, orthopnea and worsening hypoxia. Admitted for acute on chronic diastolic congestive heart failure, acute on chronic hypoxic respiratory failure. Gradually improved with diuresis but developed AKI prolonging hospitalization. Renal function peaked but then developed acute COPD exacerbation, possible pneumoniaand recurrent CHF necessitating BiPAP and transfer to SDU. Condition now improving, COPD stabilizing and renal function appears stable.  Patient resting in bed today.denies any complaints of chest pain shortness of breath.reviewed cardiology notes.  Reviewed nephrology and GI evaluation and appreciated.     Assessment & Plan:   Principal Problem:   COPD with acute exacerbation (HCC) Active Problems:   COPD (chronic obstructive pulmonary disease) (HCC)   Paroxysmal atrial fibrillation (HCC)   Long term current use of anticoagulant therapy   Chronic respiratory failure with hypoxia (HCC)   Type II diabetes mellitus (HCC)   AKI (acute kidney injury) (HCC)   CKD (chronic kidney disease), stage IV (HCC)   Acute on chronic diastolic CHF (congestive heart failure) (HCC)   Severe pulmonary arterial systolic hypertension (HCC)   Acute on chronic respiratory failure with hypoxia (HCC)  Acute on chronic hypoxic respiratory failure due to pneumonia/copd/chf-taper steroids DC Maxipime since she has received full course..will hold off on resuming torsemide.she does not appear to be in fluid overload at this time.  aki on ckd-renal functions worsened with diuretics.follow levels.We will again hold diuretics today. Rule out GI bleed.Guaiac stools.To see if this is the cause of her increasing BUN.Her hemoglobin had dropped on the sixth but the  repeat hemoglobin on the same day without any intervention came back up to baseline. She does have baseline chronic anemia. And she is on Coumadin for atrial fibrillation. Her creatinine decreased after holding the diuretics. We will follow-up levels tomorrow  Type 2 dm add insulin before meals.  afib continue coumadin.  Anemia of chronic disease guaiac stools rule out GI bleed I have started her on Protonix.  Follow-up labs tomorrow blood transfusion if hemoglobin drops less than 7.    DVT prophylaxis: Coumadin Code Status DNR Family Communication: No family available Disposition Plan: Will obtain PT evaluation patient and family would like her to go to skilled nursing facility prior to going home.  Renal functions continues to improve if all is well hoping to discharge her early next week by Monday or Tuesday. Consultants: GI, nephrology, cardiology  Procedures: None none Antimicrobials:  Maxipime Subjective: Feels okay denies any complaints of nausea vomiting diarrhea.  Objective: Resting in bed in no acute distress used BiPAP last night. Vitals:   04/05/17 0940 04/05/17 1114 04/05/17 1353 04/05/17 1439  BP:  133/60 (!) 114/52   Pulse:  76 73   Resp:   18   Temp:   97.7 F (36.5 C)   TempSrc:   Axillary   SpO2: 98%  95% 97%  Weight:      Height:        Intake/Output Summary (Last 24 hours) at 04/05/2017 1444 Last data filed at 04/05/2017 0930 Gross per 24 hour  Intake 170 ml  Output 1400 ml  Net -1230 ml   Filed Weights   04/03/17 0542 04/04/17 0519 04/05/17 0650  Weight: 70.3 kg (155 lb) 75.8 kg (167 lb) 73.1 kg (161 lb 3.2 oz)    Examination:  General exam: Appears calm and comfortable  Respiratory system: Clear to auscultation. Respiratory effort normal. Cardiovascular system: S1 & S2 heard, RRR. No JVD, murmurs, rubs, gallops or clicks. No pedal edema. Gastrointestinal system: Abdomen is nondistended, soft and nontender. No organomegaly or masses  felt. Normal bowel sounds heard. Central nervous system: Alert and oriented. No focal neurological deficits. Extremities: Symmetric 5 x 5 power. Skin: No rashes, lesions or ulcers Psychiatry: Judgement and insight appear normal. Mood & affect appropriate.     Data Reviewed: I have personally reviewed following labs and imaging studies  CBC: Recent Labs  Lab 03/30/17 0245 03/31/17 0431 04/01/17 0647 04/01/17 1901 04/02/17 0333 04/04/17 0354  WBC 8.0 12.4* 10.6*  --  8.4 8.9  NEUTROABS  --   --   --   --   --  7.8*  HGB 7.5* 7.2* 6.8* 8.2* 7.8* 7.5*  HCT 24.8* 23.4* 22.3* 26.0* 25.3* 24.2*  MCV 87.9 87.3 86.8  --  87.5 88.6  PLT 237 265 235  --  235 236   Basic Metabolic Panel: Recent Labs  Lab 04/01/17 0647 04/02/17 0333 04/03/17 0511 04/04/17 0354 04/05/17 0443  NA 138 136 136 136 139  K 4.8 5.3* 5.4* 5.4* 4.8  CL 102 100* 100* 100* 105  CO2 28 27 27 28 27   GLUCOSE 282* 292* 179* 207* 150*  BUN 98* 113* 123* 124* 116*  CREATININE 2.94* 3.02* 2.89* 2.79* 2.71*  CALCIUM 8.9 8.7* 8.6* 8.4* 8.3*   GFR: Estimated Creatinine Clearance: 14.7 mL/min (A) (by C-G formula based on SCr of 2.71 mg/dL (H)). Liver Function Tests: No results for input(s): AST, ALT, ALKPHOS, BILITOT, PROT, ALBUMIN in the last 168 hours. No results for input(s): LIPASE, AMYLASE in the last 168 hours. No results for input(s): AMMONIA in the last 168 hours. Coagulation Profile: Recent Labs  Lab 04/01/17 0647 04/02/17 0333 04/03/17 0511 04/04/17 0354 04/05/17 0443  INR 4.10* 2.32 1.61 1.82 2.38   Cardiac Enzymes: No results for input(s): CKTOTAL, CKMB, CKMBINDEX, TROPONINI in the last 168 hours. BNP (last 3 results) No results for input(s): PROBNP in the last 8760 hours. HbA1C: No results for input(s): HGBA1C in the last 72 hours. CBG: Recent Labs  Lab 04/04/17 1121 04/04/17 1615 04/04/17 2201 04/05/17 0810 04/05/17 1142  GLUCAP 126* 74 189* 134* 220*   Lipid Profile: No results  for input(s): CHOL, HDL, LDLCALC, TRIG, CHOLHDL, LDLDIRECT in the last 72 hours. Thyroid Function Tests: No results for input(s): TSH, T4TOTAL, FREET4, T3FREE, THYROIDAB in the last 72 hours. Anemia Panel: No results for input(s): VITAMINB12, FOLATE, FERRITIN, TIBC, IRON, RETICCTPCT in the last 72 hours. Sepsis Labs: No results for input(s): PROCALCITON, LATICACIDVEN in the last 168 hours.  Recent Results (from the past 240 hour(s))  Culture, blood (routine x 2) Call MD if unable to obtain prior to antibiotics being given     Status: None   Collection Time: 03/29/17  2:55 PM  Result Value Ref Range Status   Specimen Description BLOOD LEFT ANTECUBITAL  Final   Special Requests IN PEDIATRIC BOTTLE Blood Culture adequate volume  Final   Culture NO GROWTH 5 DAYS  Final   Report Status 04/03/2017 FINAL  Final  Culture, blood (routine x 2) Call MD if unable to obtain prior to antibiotics being given     Status: None   Collection Time: 03/29/17  3:02 PM  Result Value Ref Range Status   Specimen Description BLOOD LEFT HAND  Final   Special Requests   Final    BOTTLES  DRAWN AEROBIC ONLY Blood Culture adequate volume   Culture NO GROWTH 5 DAYS  Final   Report Status 04/03/2017 FINAL  Final  MRSA PCR Screening     Status: None   Collection Time: 03/30/17  7:57 AM  Result Value Ref Range Status   MRSA by PCR NEGATIVE NEGATIVE Final    Comment:        The GeneXpert MRSA Assay (FDA approved for NASAL specimens only), is one component of a comprehensive MRSA colonization surveillance program. It is not intended to diagnose MRSA infection nor to guide or monitor treatment for MRSA infections.          Radiology Studies: Dg Swallowing Func-speech Pathology  Result Date: 04/04/2017 Objective Swallowing Evaluation: Type of Study: MBS-Modified Barium Swallow Study  Patient Details Name: LYNNLEE KAGARISE MRN: [id number] Date of Birth: 05-May-1932 Today's Date: 04/04/2017 Time: SLP Start  Time (ACUTE ONLY): 8416 -SLP Stop Time (ACUTE ONLY): 0918 SLP Time Calculation (min) (ACUTE ONLY): 15 min Past Medical History: Past Medical History: Diagnosis Date . Anxiety  . Chronic diastolic CHF (congestive heart failure) (HCC)   a. 05/2015 Echo: EF 55-60%, mild LVH, no rwma, mild MR, mildly dil RA, mod TR, PASP . . CKD (chronic kidney disease), stage IV (HCC)  . Confusion state  . COPD (chronic obstructive pulmonary disease) (HCC)   a. on home O2 @ 3lpm. . Elevated troponin   a. H/o elevated trop in setting of CHF; b. 06/2013 Lexiscan MV: nl EF, mild anterior breast attenuation, no ischemia.  . Family history of adverse reaction to anesthesia  . GIB (gastrointestinal bleeding)   a. History of GIB. Marland Kitchen Hypertensive heart disease  . Macular degeneration, bilateral  . Myocardial infarction (HCC)  . Neuropathy  . Paroxysmal atrial fibrillation (HCC)   a. CHA2DS2VASc = 6-->coumadin. Marland Kitchen PONV (postoperative nausea and vomiting)  . Type II diabetes mellitus (HCC)  . Varicose veins  Past Surgical History: Past Surgical History: Procedure Laterality Date . ABDOMINAL HYSTERECTOMY   . FRACTURE SURGERY   . HIP FRACTURE SURGERY  2004 . VEIN SURGERY   HPI: [name] a31 y.o.female,with past medical history relevant for diastolic dysfunction CHF, COPD on 2-3 L of oxygen at home, DM, MI admitted with a 2 to three-day history of worsening shortness of breath, orthopnea and increased oxygen requirement.CXR 11/3 findings are similar to the most recent prior exam with bilateral patchy airspace lung opacities. Name opacity at the lung bases is likely due to the development of small pleural effusions. Findings are consistent with either asymmetric pulmonary edema or multifocal pneumonia. MBS 02/28/16 functional swallow, slight oral holding; barium pill hesitated mid esophagus and transited iwth additional puree. Reg/thin recommended. Given family reports of frequent coughing with/after meals and COPD, proceed with  MBS.  No Data Recorded Assessment / Plan / Recommendation CHL IP CLINICAL IMPRESSIONS 04/04/2017 Clinical Impression MBS results same per documentation as prior study 02/2016. No significant impairments with oral control, manipulation or transit. Pharyngeal phase intact with timely initiation of protective mechanisms, hyolaryngeal excursion and elevation and epiglottic deflection. No pharyngeal residue. Pill observed to stop in mid esophagus with sudden retrograde movment of barium close to pharynx requiring puree to assist in transit through GE junction (thin barium ineffective). Pt prefers to remain on Dys 2 texture; recommend continue thin liquids and bite puree following pills to ensure transit through esophagus. Esophageal precautions (remain upright after meals 45 min to 1 hour, smaller meals more frequently, elevate head at night). No further ST  needed.   SLP Visit Diagnosis Dysphagia, unspecified (R13.10) Attention and concentration deficit following -- Frontal lobe and executive function deficit following -- Impact on safety and function (No Data)   CHL IP TREATMENT RECOMMENDATION 04/04/2017 Treatment Recommendations No treatment recommended at this time   Prognosis 03/31/2017 Prognosis for Safe Diet Advancement Good Barriers to Reach Goals -- Barriers/Prognosis Comment -- CHL IP DIET RECOMMENDATION 04/04/2017 SLP Diet Recommendations Dysphagia 2 (Fine chop) solids;Thin liquid Liquid Administration via Cup;Straw Medication Administration Whole meds with liquid Compensations Slow rate;Small sips/bites Postural Changes Seated upright at 90 degrees;Remain semi-upright after after feeds/meals (Comment)   CHL IP OTHER RECOMMENDATIONS 04/04/2017 Recommended Consults -- Oral Care Recommendations Oral care BID Other Recommendations --   CHL IP FOLLOW UP RECOMMENDATIONS 04/04/2017 Follow up Recommendations None   CHL IP FREQUENCY AND DURATION 03/31/2017 Speech Therapy Frequency (ACUTE ONLY) min 2x/week Treatment Duration 2  weeks      CHL IP ORAL PHASE 04/04/2017 Oral Phase WFL Oral - Pudding Teaspoon -- Oral - Pudding Cup -- Oral - Honey Teaspoon -- Oral - Honey Cup -- Oral - Nectar Teaspoon -- Oral - Nectar Cup -- Oral - Nectar Straw -- Oral - Thin Teaspoon -- Oral - Thin Cup -- Oral - Thin Straw -- Oral - Puree -- Oral - Mech Soft -- Oral - Regular -- Oral - Multi-Consistency -- Oral - Pill -- Oral Phase - Comment --  CHL IP PHARYNGEAL PHASE 04/04/2017 Pharyngeal Phase WFL Pharyngeal- Pudding Teaspoon -- Pharyngeal -- Pharyngeal- Pudding Cup -- Pharyngeal -- Pharyngeal- Honey Teaspoon -- Pharyngeal -- Pharyngeal- Honey Cup -- Pharyngeal -- Pharyngeal- Nectar Teaspoon -- Pharyngeal -- Pharyngeal- Nectar Cup -- Pharyngeal -- Pharyngeal- Nectar Straw -- Pharyngeal -- Pharyngeal- Thin Teaspoon -- Pharyngeal -- Pharyngeal- Thin Cup -- Pharyngeal -- Pharyngeal- Thin Straw -- Pharyngeal -- Pharyngeal- Puree -- Pharyngeal -- Pharyngeal- Mechanical Soft -- Pharyngeal -- Pharyngeal- Regular -- Pharyngeal -- Pharyngeal- Multi-consistency -- Pharyngeal -- Pharyngeal- Pill -- Pharyngeal -- Pharyngeal Comment --  CHL IP CERVICAL ESOPHAGEAL PHASE 04/04/2017 Cervical Esophageal Phase WFL Pudding Teaspoon -- Pudding Cup -- Honey Teaspoon -- Honey Cup -- Nectar Teaspoon -- Nectar Cup -- Nectar Straw -- Thin Teaspoon -- Thin Cup -- Thin Straw -- Puree -- Mechanical Soft -- Regular -- Multi-consistency -- Pill -- Cervical Esophageal Comment -- No flowsheet data found. Royce MacadamiaLitaker, Lisa Willis 04/04/2017, 10:38 AM Breck CoonsLisa Willis Lonell FaceLitaker M.Ed CCC-SLP Pager [phone]                   Scheduled Meds: . atorvastatin  20 mg Oral Daily  . cholecalciferol  1,000 Units Oral q morning - 10a  . escitalopram  10 mg Oral Daily  . feeding supplement (ENSURE ENLIVE)  237 mL Oral BID BM  . fenofibrate  160 mg Oral Daily  . gabapentin  100 mg Oral QHS  . guaiFENesin  600 mg Oral BID  . hydrALAZINE  75 mg Oral BID  . insulin aspart  0-15 Units Subcutaneous TID WC    . insulin aspart  5 Units Subcutaneous TID WC  . insulin detemir  5 Units Subcutaneous Daily  . ipratropium-albuterol  3 mL Nebulization TID  . isosorbide mononitrate  30 mg Oral Daily  . methimazole  5 mg Oral Daily  . metoprolol tartrate  12.5 mg Oral BID  . multivitamin with minerals  1 tablet Oral Daily  . pantoprazole  40 mg Oral Daily  . polyethylene glycol  17 g Oral BID  . predniSONE  30 mg Oral Q breakfast  . senna  1 tablet Oral QHS  . sodium chloride flush  3 mL Intravenous Q12H  . warfarin  4 mg Oral ONCE-1800  . Warfarin - Pharmacist Dosing Inpatient   Does not apply q1800   Continuous Infusions: . sodium chloride Stopped (03/29/17 0150)  . ceFEPime (MAXIPIME) IV Stopped (04/04/17 1527)     LOS: 16 days      Alwyn Ren, MD [facility]  If 7PM-7AM, please contact night-coverage [email or web address] Password [email or web address] 04/05/2017, 2:44 PM

## 2017-04-05 NOTE — Progress Notes (Signed)
Subjective: Interval History: has no complaint of shortness of breath, edema.  Objective: Vital signs in last 24 hours: Temp:  [97.3 F (36.3 C)-97.6 F (36.4 C)] 97.6 F (36.4 C) (11/10 0650) Pulse Rate:  [65-78] 65 (11/10 0650) Resp:  [12-19] 12 (11/10 0650) BP: (109-136)/(47-94) 109/94 (11/10 0831) SpO2:  [94 %-98 %] 98 % (11/10 0940) FiO2 (%):  [28 %] 28 % (11/09 1345) Weight:  [161 lb 3.2 oz (73.1 kg)] 161 lb 3.2 oz (73.1 kg) (11/10 0650) Weight change: -12.8 oz (-2.631 kg)  Intake/Output from previous day: 11/09 0701 - 11/10 0700 In: 50 [IV Piggyback:50] Out: 1400 [Urine:1400] Intake/Output this shift: Total I/O In: 120 [P.O.:120] Out: -   General appearance: alert, cooperative and no distress Resp: normal work of breathing, Vander in place. CTA in anterior lung fields Cardio: irregular, no MRG GI: soft, non-tender; bowel sounds normal; no masses,  no organomegaly Extremities: no edema, SCDs in place  Lab Results: Recent Labs    04/04/17 0354  WBC 8.9  HGB 7.5*  HCT 24.2*  PLT 236   BMET:  Recent Labs    04/04/17 0354 04/05/17 0443  NA 136 139  K 5.4* 4.8  CL 100* 105  CO2 28 27  GLUCOSE 207* 150*  BUN 124* 116*  CREATININE 2.79* 2.71*  CALCIUM 8.4* 8.3*   No results for input(s): PTH in the last 72 hours. Iron Studies: No results for input(s): IRON, TIBC, TRANSFERRIN, FERRITIN in the last 72 hours.  Studies/Results: Dg Swallowing Func-speech Pathology  Result Date: 04/04/2017 Objective Swallowing Evaluation: Type of Study: MBS-Modified Barium Swallow Study  Patient Details Name: Alexis GivensChristine E Hilario MRN: [id number] Date of Birth: 01/19/1932 Today's Date: 04/04/2017 Time: SLP Start Time (ACUTE ONLY): 40980903 -SLP Stop Time (ACUTE ONLY): 0918 SLP Time Calculation (min) (ACUTE ONLY): 15 min Past Medical History: Past Medical History: Diagnosis Date . Anxiety  . Chronic diastolic CHF (congestive heart failure) (HCC)   a. 05/2015 Echo: EF 55-60%, mild LVH, no rwma,  mild MR, mildly dil RA, mod TR, PASP 58mmHg. . CKD (chronic kidney disease), stage IV (HCC)  . Confusion state  . COPD (chronic obstructive pulmonary disease) (HCC)   a. on home O2 @ 3lpm. . Elevated troponin   a. H/o elevated trop in setting of CHF; b. 06/2013 Lexiscan MV: nl EF, mild anterior breast attenuation, no ischemia.  . Family history of adverse reaction to anesthesia  . GIB (gastrointestinal bleeding)   a. History of GIB. Marland Kitchen. Hypertensive heart disease  . Macular degeneration, bilateral  . Myocardial infarction (HCC)  . Neuropathy  . Paroxysmal atrial fibrillation (HCC)   a. CHA2DS2VASc = 6-->coumadin. Marland Kitchen. PONV (postoperative nausea and vomiting)  . Type II diabetes mellitus (HCC)  . Varicose veins  Past Surgical History: Past Surgical History: Procedure Laterality Date . ABDOMINAL HYSTERECTOMY   . FRACTURE SURGERY   . HIP FRACTURE SURGERY  2004 . VEIN SURGERY   HPI: [name] [id number] y.o.female,with past medical history relevant for diastolic dysfunction CHF, COPD on 2-3 L of oxygen at home, DM, MI admitted with a 2 to three-day history of worsening shortness of breath, orthopnea and increased oxygen requirement.CXR 11/3 findings are similar to the most recent prior exam with bilateral patchy airspace lung opacities. Name opacity at the lung bases is likely due to the development of small pleural effusions. Findings are consistent with either asymmetric pulmonary edema or multifocal pneumonia. MBS 02/28/16 functional swallow, slight oral holding; barium pill hesitated mid esophagus and transited iwth  additional puree. Reg/thin recommended. Given family reports of frequent coughing with/after meals and COPD, proceed with MBS.  No Data Recorded Assessment / Plan / Recommendation CHL IP CLINICAL IMPRESSIONS 04/04/2017 Clinical Impression MBS results same per documentation as prior study 02/2016. No significant impairments with oral control, manipulation or transit. Pharyngeal phase intact with timely  initiation of protective mechanisms, hyolaryngeal excursion and elevation and epiglottic deflection. No pharyngeal residue. Pill observed to stop in mid esophagus with sudden retrograde movment of barium close to pharynx requiring puree to assist in transit through GE junction (thin barium ineffective). Pt prefers to remain on Dys 2 texture; recommend continue thin liquids and bite puree following pills to ensure transit through esophagus. Esophageal precautions (remain upright after meals 45 min to 1 hour, smaller meals more frequently, elevate head at night). No further ST needed.   SLP Visit Diagnosis Dysphagia, unspecified (R13.10) Attention and concentration deficit following -- Frontal lobe and executive function deficit following -- Impact on safety and function (No Data)   CHL IP TREATMENT RECOMMENDATION 04/04/2017 Treatment Recommendations No treatment recommended at this time   Prognosis 03/31/2017 Prognosis for Safe Diet Advancement Good Barriers to Reach Goals -- Barriers/Prognosis Comment -- CHL IP DIET RECOMMENDATION 04/04/2017 SLP Diet Recommendations Dysphagia 2 (Fine chop) solids;Thin liquid Liquid Administration via Cup;Straw Medication Administration Whole meds with liquid Compensations Slow rate;Small sips/bites Postural Changes Seated upright at 90 degrees;Remain semi-upright after after feeds/meals (Comment)   CHL IP OTHER RECOMMENDATIONS 04/04/2017 Recommended Consults -- Oral Care Recommendations Oral care BID Other Recommendations --   CHL IP FOLLOW UP RECOMMENDATIONS 04/04/2017 Follow up Recommendations None   CHL IP FREQUENCY AND DURATION 03/31/2017 Speech Therapy Frequency (ACUTE ONLY) min 2x/week Treatment Duration 2 weeks      CHL IP ORAL PHASE 04/04/2017 Oral Phase WFL Oral - Pudding Teaspoon -- Oral - Pudding Cup -- Oral - Honey Teaspoon -- Oral - Honey Cup -- Oral - Nectar Teaspoon -- Oral - Nectar Cup -- Oral - Nectar Straw -- Oral - Thin Teaspoon -- Oral - Thin Cup -- Oral - Thin Straw --  Oral - Puree -- Oral - Mech Soft -- Oral - Regular -- Oral - Multi-Consistency -- Oral - Pill -- Oral Phase - Comment --  CHL IP PHARYNGEAL PHASE 04/04/2017 Pharyngeal Phase WFL Pharyngeal- Pudding Teaspoon -- Pharyngeal -- Pharyngeal- Pudding Cup -- Pharyngeal -- Pharyngeal- Honey Teaspoon -- Pharyngeal -- Pharyngeal- Honey Cup -- Pharyngeal -- Pharyngeal- Nectar Teaspoon -- Pharyngeal -- Pharyngeal- Nectar Cup -- Pharyngeal -- Pharyngeal- Nectar Straw -- Pharyngeal -- Pharyngeal- Thin Teaspoon -- Pharyngeal -- Pharyngeal- Thin Cup -- Pharyngeal -- Pharyngeal- Thin Straw -- Pharyngeal -- Pharyngeal- Puree -- Pharyngeal -- Pharyngeal- Mechanical Soft -- Pharyngeal -- Pharyngeal- Regular -- Pharyngeal -- Pharyngeal- Multi-consistency -- Pharyngeal -- Pharyngeal- Pill -- Pharyngeal -- Pharyngeal Comment --  CHL IP CERVICAL ESOPHAGEAL PHASE 04/04/2017 Cervical Esophageal Phase WFL Pudding Teaspoon -- Pudding Cup -- Honey Teaspoon -- Honey Cup -- Nectar Teaspoon -- Nectar Cup -- Nectar Straw -- Thin Teaspoon -- Thin Cup -- Thin Straw -- Puree -- Mechanical Soft -- Regular -- Multi-consistency -- Pill -- Cervical Esophageal Comment -- No flowsheet data found. Royce Macadamia 04/04/2017, 10:38 AM Breck Coons Lonell Face.Ed CCC-SLP Pager [phone]               Scheduled: . atorvastatin  20 mg Oral Daily  . cholecalciferol  1,000 Units Oral q morning - 10a  . escitalopram  10 mg Oral Daily  .  feeding supplement (ENSURE ENLIVE)  237 mL Oral BID BM  . fenofibrate  160 mg Oral Daily  . gabapentin  100 mg Oral QHS  . guaiFENesin  600 mg Oral BID  . hydrALAZINE  75 mg Oral BID  . insulin aspart  0-15 Units Subcutaneous TID WC  . insulin aspart  5 Units Subcutaneous TID WC  . insulin detemir  5 Units Subcutaneous Daily  . ipratropium-albuterol  3 mL Nebulization TID  . isosorbide mononitrate  30 mg Oral Daily  . methimazole  5 mg Oral Daily  . metoprolol tartrate  12.5 mg Oral BID  . multivitamin with minerals   1 tablet Oral Daily  . pantoprazole  40 mg Oral Daily  . polyethylene glycol  17 g Oral BID  . predniSONE  30 mg Oral Q breakfast  . senna  1 tablet Oral QHS  . sodium chloride flush  3 mL Intravenous Q12H  . Warfarin - Pharmacist Dosing Inpatient   Does not apply q1800    Assessment/Plan: Pt is a 83 y.o. yo female with a PMHX of COPD on oxygen, T2DM, afib, CKDIV, HTN, anemia, diastolic CHF and PVD, was admitted to [facility] on 03/20/2017 with CHF exacerbation.   1. Acute on chronic kidney disease- Creatinine stable. Her baseline appears to be 1.7-2.0. No signs of uremia. She appears euvolemic. This may be a new baseline Cr for her. Renal US shows chronic renal disease, corticol atrophy and scarring, no obstruction of hydronephrosis -Agree with holding diuretics -continue to monitor fluid status closely -monitor renal function -avoid NSAIDs, ACEi  2. Hyperkalemia- potassium normal at 4.8 today -low potassium diet, follow potassium. No ACEi  3. CHF- euvoluemic, cardiology following, continue metoprolol  4. COPD- per primary. Supplemental oxygen as needed   5. T2DM- per primary  6. Anemia- continue to monitor   7. Afib- on coumadin- continue anticoagulation   DVT PPX - on coumadin  Dispo: pending, SNF vs home w/ HH  Discussed case with Dr. Hyman Hopes, nephrology will sign off at this time. Does not appear to need nephrology follow up at time of discharge, will leave this to discretion of PCP. Please re-consult if need arises.     LOS: 16 days   Marshayla Mitschke C Yeng Perz 04/05/2017,10:02 AM

## 2017-04-05 NOTE — Progress Notes (Signed)
Progress Note  Patient Name: Alexis [name] Date of Encounter: 04/05/2017  Primary Cardiologist: Dr. Zoila Shutter  Subjective   No chest pain or palpitations.  States that she slept well on BiPAP.  Inpatient Medications    Scheduled Meds: . atorvastatin  20 mg Oral Daily  . cholecalciferol  1,000 Units Oral q morning - 10a  . escitalopram  10 mg Oral Daily  . feeding supplement (ENSURE ENLIVE)  237 mL Oral BID BM  . fenofibrate  160 mg Oral Daily  . gabapentin  100 mg Oral QHS  . guaiFENesin  600 mg Oral BID  . hydrALAZINE  75 mg Oral BID  . insulin aspart  0-15 Units Subcutaneous TID WC  . insulin aspart  5 Units Subcutaneous TID WC  . insulin detemir  5 Units Subcutaneous Daily  . ipratropium-albuterol  3 mL Nebulization TID  . isosorbide mononitrate  30 mg Oral Daily  . methimazole  5 mg Oral Daily  . metoprolol tartrate  12.5 mg Oral BID  . multivitamin with minerals  1 tablet Oral Daily  . pantoprazole  40 mg Oral Daily  . polyethylene glycol  17 g Oral BID  . predniSONE  30 mg Oral Q breakfast  . senna  1 tablet Oral QHS  . sodium chloride flush  3 mL Intravenous Q12H  . Warfarin - Pharmacist Dosing Inpatient   Does not apply q1800   Continuous Infusions: . sodium chloride Stopped (03/29/17 0150)  . ceFEPime (MAXIPIME) IV Stopped (04/04/17 1527)   PRN Meds: sodium chloride, acetaminophen **OR** acetaminophen, albuterol, ondansetron **OR** ondansetron (ZOFRAN) IV, sodium chloride flush, traZODone   Vital Signs    Vitals:   04/04/17 2013 04/04/17 2125 04/05/17 0409 04/05/17 0650  BP: (!) 124/51 132/60 (!) 123/47 (!) 136/55  Pulse: 78 78  65  Resp: 14   12  Temp: (!) 97.4 F (36.3 C)   97.6 F (36.4 C)  TempSrc: Oral   Axillary  SpO2: 95%   97%  Weight:    161 lb 3.2 oz (73.1 kg)  Height:        Intake/Output Summary (Last 24 hours) at 04/05/2017 0806 Last data filed at 04/05/2017 0653 Gross per 24 hour  Intake 50 ml  Output 1400 ml  Net  -1350 ml   Filed Weights   04/03/17 0542 04/04/17 0519 04/05/17 0650  Weight: 155 lb (70.3 kg) 167 lb (75.8 kg) 161 lb 3.2 oz (73.1 kg)    Telemetry    Rate controlled atrial fibrillation.  Personally reviewed.  Physical Exam   GEN:  Elderly woman, no acute distress.   Neck: No JVD. Cardiac:  Irregularly irregular, no gallop.  Respiratory: Nonlabored.  No wheezing. GI: Soft, nontender, bowel sounds present. MS: No edema; No deformity. Neuro:  Nonfocal. Psych: Alert and oriented x 3. Normal affect.  Labs    Chemistry Recent Labs  Lab 04/03/17 0511 04/04/17 0354 04/05/17 0443  NA 136 136 139  K 5.4* 5.4* 4.8  CL 100* 100* 105  CO2 27 28 27   GLUCOSE 179* 207* 150*  BUN 123* 124* 116*  CREATININE 2.89* 2.79* 2.71*  CALCIUM 8.6* 8.4* 8.3*  GFRNONAA 14* 15* 15*  GFRAA 16* 17* 18*  ANIONGAP 9 8 7      Hematology Recent Labs  Lab 04/01/17 0647 04/01/17 1901 04/02/17 0333 04/04/17 0354  WBC 10.6*  --  8.4 8.9  RBC 2.57*  --  2.89* 2.73*  HGB 6.8* 8.2* 7.8* 7.5*  HCT 22.3* 26.0* 25.3* 24.2*  MCV 86.8  --  87.5 88.6  MCH 26.5  --  27.0 27.5  MCHC 30.5  --  30.8 31.0  RDW 16.8*  --  16.1* 16.6*  PLT 235  --  235 236    Radiology    Dg Swallowing Func-speech Pathology  Result Date: 04/04/2017 Objective Swallowing Evaluation: Type of Study: MBS-Modified Barium Swallow Study  Patient Details Name: [name] MRN: [id number] Date of Birth: 1932/04/27 Today's Date: 04/04/2017 Time: SLP Start Time (ACUTE ONLY): 4098 -SLP Stop Time (ACUTE ONLY): 0918 SLP Time Calculation (min) (ACUTE ONLY): 15 min Past Medical History: Past Medical History: Diagnosis Date . Anxiety  . Chronic diastolic CHF (congestive heart failure) (HCC)   a. 05/2015 Echo: EF 55-60%, mild LVH, no rwma, mild MR, mildly dil RA, mod TR, PASP . . CKD (chronic kidney disease), stage IV (HCC)  . Confusion state  . COPD (chronic obstructive pulmonary disease) (HCC)   a. on home O2 @ 3lpm. . Elevated  troponin   a. H/o elevated trop in setting of CHF; b. 06/2013 Lexiscan MV: nl EF, mild anterior breast attenuation, no ischemia.  . Family history of adverse reaction to anesthesia  . GIB (gastrointestinal bleeding)   a. History of GIB. Marland Kitchen Hypertensive heart disease  . Macular degeneration, bilateral  . Myocardial infarction (HCC)  . Neuropathy  . Paroxysmal atrial fibrillation (HCC)   a. CHA2DS2VASc = 6-->coumadin. Marland Kitchen PONV (postoperative nausea and vomiting)  . Type II diabetes mellitus (HCC)  . Varicose veins  Past Surgical History: Past Surgical History: Procedure Laterality Date . ABDOMINAL HYSTERECTOMY   . FRACTURE SURGERY   . HIP FRACTURE SURGERY  2004 . VEIN SURGERY   HPI: [name] a83 y.o.female,with past medical history relevant for diastolic dysfunction CHF, COPD on 2-3 L of oxygen at home, DM, MI admitted with a 2 to three-day history of worsening shortness of breath, orthopnea and increased oxygen requirement.CXR 11/3 findings are similar to the most recent prior exam with bilateral patchy airspace lung opacities. Name opacity at the lung bases is likely due to the development of small pleural effusions. Findings are consistent with either asymmetric pulmonary edema or multifocal pneumonia. MBS 02/28/16 functional swallow, slight oral holding; barium pill hesitated mid esophagus and transited iwth additional puree. Reg/thin recommended. Given family reports of frequent coughing with/after meals and COPD, proceed with MBS.  No Data Recorded Assessment / Plan / Recommendation CHL IP CLINICAL IMPRESSIONS 04/04/2017 Clinical Impression MBS results same per documentation as prior study 02/2016. No significant impairments with oral control, manipulation or transit. Pharyngeal phase intact with timely initiation of protective mechanisms, hyolaryngeal excursion and elevation and epiglottic deflection. No pharyngeal residue. Pill observed to stop in mid esophagus with sudden retrograde movment of  barium close to pharynx requiring puree to assist in transit through GE junction (thin barium ineffective). Pt prefers to remain on Dys 2 texture; recommend continue thin liquids and bite puree following pills to ensure transit through esophagus. Esophageal precautions (remain upright after meals 45 min to 1 hour, smaller meals more frequently, elevate head at night). No further ST needed.   SLP Visit Diagnosis Dysphagia, unspecified (R13.10) Attention and concentration deficit following -- Frontal lobe and executive function deficit following -- Impact on safety and function (No Data)   CHL IP TREATMENT RECOMMENDATION 04/04/2017 Treatment Recommendations No treatment recommended at this time   Prognosis 03/31/2017 Prognosis for Safe Diet Advancement Good Barriers to Reach Goals -- Barriers/Prognosis Comment --  CHL IP DIET RECOMMENDATION 04/04/2017 SLP Diet Recommendations Dysphagia 2 (Fine chop) solids;Thin liquid Liquid Administration via Cup;Straw Medication Administration Whole meds with liquid Compensations Slow rate;Small sips/bites Postural Changes Seated upright at 90 degrees;Remain semi-upright after after feeds/meals (Comment)   CHL IP OTHER RECOMMENDATIONS 04/04/2017 Recommended Consults -- Oral Care Recommendations Oral care BID Other Recommendations --   CHL IP FOLLOW UP RECOMMENDATIONS 04/04/2017 Follow up Recommendations None   CHL IP FREQUENCY AND DURATION 03/31/2017 Speech Therapy Frequency (ACUTE ONLY) min 2x/week Treatment Duration 2 weeks      CHL IP ORAL PHASE 04/04/2017 Oral Phase WFL Oral - Pudding Teaspoon -- Oral - Pudding Cup -- Oral - Honey Teaspoon -- Oral - Honey Cup -- Oral - Nectar Teaspoon -- Oral - Nectar Cup -- Oral - Nectar Straw -- Oral - Thin Teaspoon -- Oral - Thin Cup -- Oral - Thin Straw -- Oral - Puree -- Oral - Mech Soft -- Oral - Regular -- Oral - Multi-Consistency -- Oral - Pill -- Oral Phase - Comment --  CHL IP PHARYNGEAL PHASE 04/04/2017 Pharyngeal Phase WFL Pharyngeal- Pudding  Teaspoon -- Pharyngeal -- Pharyngeal- Pudding Cup -- Pharyngeal -- Pharyngeal- Honey Teaspoon -- Pharyngeal -- Pharyngeal- Honey Cup -- Pharyngeal -- Pharyngeal- Nectar Teaspoon -- Pharyngeal -- Pharyngeal- Nectar Cup -- Pharyngeal -- Pharyngeal- Nectar Straw -- Pharyngeal -- Pharyngeal- Thin Teaspoon -- Pharyngeal -- Pharyngeal- Thin Cup -- Pharyngeal -- Pharyngeal- Thin Straw -- Pharyngeal -- Pharyngeal- Puree -- Pharyngeal -- Pharyngeal- Mechanical Soft -- Pharyngeal -- Pharyngeal- Regular -- Pharyngeal -- Pharyngeal- Multi-consistency -- Pharyngeal -- Pharyngeal- Pill -- Pharyngeal -- Pharyngeal Comment --  CHL IP CERVICAL ESOPHAGEAL PHASE 04/04/2017 Cervical Esophageal Phase WFL Pudding Teaspoon -- Pudding Cup -- Honey Teaspoon -- Honey Cup -- Nectar Teaspoon -- Nectar Cup -- Nectar Straw -- Thin Teaspoon -- Thin Cup -- Thin Straw -- Puree -- Mechanical Soft -- Regular -- Multi-consistency -- Pill -- Cervical Esophageal Comment -- No flowsheet data found. Royce MacadamiaLitaker, Lisa Willis 04/04/2017, 10:38 AM Breck CoonsLisa Willis Lonell FaceLitaker M.Ed CCC-SLP Pager [phone]               Cardiac Studies   Echocardiogram 03/21/2017: Study Conclusions  - Left ventricle: The cavity size was normal. Wall thickness was   normal. Systolic function was vigorous. The estimated ejection   fraction was in the range of 65% to 70%. Wall motion was normal;   there were no regional wall motion abnormalities. Features are   consistent with a pseudonormal left ventricular filling pattern,   with concomitant abnormal relaxation and increased filling   pressure (grade 2 diastolic dysfunction). - Aortic valve: Trileaflet; moderately thickened, moderately   calcified leaflets. There was mild stenosis. Peak velocity (S):   276 cm/s. Mean gradient (S): 15 mm Hg. Valve area (VTI): 1.69   cm^2. Valve area (Vmax): 1.76 cm^2. Valve area (Vmean): 1.64   cm^2. - Mitral valve: Moderately calcified annulus. Mildly thickened   leaflets . There was  moderate regurgitation. - Left atrium: The atrium was moderately to severely dilated.   Volume/bsa, S: 49.5 ml/m^2. - Right atrium: The atrium was moderately dilated. - Tricuspid valve: There was moderate regurgitation. - Pulmonary arteries: Systolic pressure was severely increased. PA   peak pressure: 70 mm Hg (S).  Impressions:  - Compared to the prior study, there has been no significant   interval change.  Patient Profile     83 y.o. female with a history of persistent atrial fibrillation, COPD, CKD stage IV, and chronic  diastolic heart failure, now admitted with multifactorial shortness of breath and fatigue.  Assessment & Plan    1.  Acute on chronic diastolic heart failure.  Diuretics held in the setting of acute on chronic renal failure.  Net output still negative approximately 1300 cc last 24 hours.  Continues on hydralazine, Imdur, and Lopressor.  2.  Persistent atrial fibrillation with adequate heart rate control on Lopressor.  Coumadin per pharmacy with therapeutic INR of 2.38.  3.  COPD exacerbation, improved per primary team.  4.  CKD stage IV, most recent creatinine down to 2.71.  5.  Anemia of chronic disease and also with guaiac positive stools.  GI recommends conservative management with PPI.  Current hemoglobin 7.5.  No changes made today from a cardiac perspective.  Continue hydralazine, Imdur, Lopressor, Lipitor and Coumadin per pharmacy.  Hold diuretics.  Signed, Nona Dell, MD  04/05/2017, 8:06 AM

## 2017-04-05 NOTE — Progress Notes (Signed)
ANTICOAGULATION CONSULT NOTE   Pharmacy Consult for Warfarin Indication: atrial fibrillation  Labs: Recent Labs    04/03/17 0511 04/04/17 0354 04/05/17 0443  HGB  --  7.5*  --   HCT  --  24.2*  --   PLT  --  236  --   LABPROT 19.0* 20.9* 25.8*  INR 1.61 1.82 2.38  CREATININE 2.89* 2.79* 2.71*    Estimated Creatinine Clearance: 14.7 mL/min (A) (by C-G formula based on SCr of 2.71 mg/dL (H)).  Assessment: 42 YOF on warfarin PTA for AFib - Home dose (as per Coumadin Clinic note on 10/12) was 4mg  daily except 6mg  on Mondays - dose was reduced to that regimen during that appointment d/t INR of 3.6 in the clinic. CHADSVASc score 6  INR is therapeutic at 2.3 from 1.8 after 5mg  yesterday. Hgb low stable at 7.5, no bleeding noted. Will return to PTA dose today and trend INR.  Drug interactions include fenofibrate and methimazole   Goal of Therapy:  INR 2-3 Monitor platelets by anticoagulation protocol: Yes   Plan:  Warfarin 4mg  PO x 1 tonight Daily INR   Nolen Mu PharmD PGY1 Pharmacy Practice Resident 04/05/2017 10:07 AM Pager: [phone] Phone: [phone]

## 2017-04-06 LAB — CBC WITH DIFFERENTIAL/PLATELET
BASOS PCT: 0 %
Basophils Absolute: 0 10*3/uL (ref 0.0–0.1)
EOS ABS: 0.1 10*3/uL (ref 0.0–0.7)
EOS PCT: 1 %
HCT: 25.5 % — ABNORMAL LOW (ref 36.0–46.0)
HEMOGLOBIN: 7.9 g/dL — AB (ref 12.0–15.0)
Lymphocytes Relative: 6 %
Lymphs Abs: 0.5 10*3/uL — ABNORMAL LOW (ref 0.7–4.0)
MCH: 28.1 pg (ref 26.0–34.0)
MCHC: 31 g/dL (ref 30.0–36.0)
MCV: 90.7 fL (ref 78.0–100.0)
Monocytes Absolute: 0.7 10*3/uL (ref 0.1–1.0)
Monocytes Relative: 9 %
NEUTROS PCT: 84 %
Neutro Abs: 7 10*3/uL (ref 1.7–7.7)
PLATELETS: 222 10*3/uL (ref 150–400)
RBC: 2.81 MIL/uL — AB (ref 3.87–5.11)
RDW: 17.7 % — ABNORMAL HIGH (ref 11.5–15.5)
WBC: 8.3 10*3/uL (ref 4.0–10.5)

## 2017-04-06 LAB — BASIC METABOLIC PANEL
Anion gap: 6 (ref 5–15)
BUN: 119 mg/dL — AB (ref 6–20)
CHLORIDE: 102 mmol/L (ref 101–111)
CO2: 28 mmol/L (ref 22–32)
CREATININE: 2.63 mg/dL — AB (ref 0.44–1.00)
Calcium: 8.3 mg/dL — ABNORMAL LOW (ref 8.9–10.3)
GFR, EST AFRICAN AMERICAN: 18 mL/min — AB (ref >60)
GFR, EST NON AFRICAN AMERICAN: 16 mL/min — AB (ref >60)
Glucose, Bld: 205 mg/dL — ABNORMAL HIGH (ref 65–99)
POTASSIUM: 4.9 mmol/L (ref 3.5–5.1)
SODIUM: 136 mmol/L (ref 135–145)

## 2017-04-06 LAB — GLUCOSE, CAPILLARY
GLUCOSE-CAPILLARY: 171 mg/dL — AB (ref 65–99)
Glucose-Capillary: 139 mg/dL — ABNORMAL HIGH (ref 65–99)
Glucose-Capillary: 216 mg/dL — ABNORMAL HIGH (ref 65–99)
Glucose-Capillary: 210 mg/dL — ABNORMAL HIGH (ref 65–99)

## 2017-04-06 LAB — PROTIME-INR
INR: 3.21
Prothrombin Time: 32.5 seconds — ABNORMAL HIGH (ref 11.4–15.2)

## 2017-04-06 NOTE — Progress Notes (Signed)
PROGRESS NOTE    Alexis [name]  ZOX:[id number] DOB: 09/24/1931 DOA: 03/20/2017 PCP: Ladora DanielBeal, [name], PA-C  Brief Narrative:83 year old woman PMH diastolic heart failure, oxygen dependent COPD presented with increasing shortness of breath, orthopnea and worsening hypoxia. Admitted for acute on chronic diastolic congestive heart failure, acute on chronic hypoxic respiratory failure. Gradually improved with diuresis but developed AKI prolonging hospitalization. Renal function peaked but then developed acute COPD exacerbation, possible pneumoniaand recurrent CHF necessitating BiPAP and transfer to SDU. Condition now improving, COPD stabilizing and renal function appears stable.  Patient resting in bed today.denies any complaints of chest pain shortness of breath.reviewed cardiology noteS.    Assessment & Plan:   Principal Problem:   COPD with acute exacerbation (HCC) Active Problems:   COPD (chronic obstructive pulmonary disease) (HCC)   Paroxysmal atrial fibrillation (HCC)   Long term current use of anticoagulant therapy   Chronic respiratory failure with hypoxia (HCC)   Type II diabetes mellitus (HCC)   AKI (acute kidney injury) (HCC)   CKD (chronic kidney disease), stage IV (HCC)   Acute on chronic diastolic CHF (congestive heart failure) (HCC)   Severe pulmonary arterial systolic hypertension (HCC)   Acute on chronic respiratory failure with hypoxia (HCC)  Acute on chronic hypoxic respiratory failure due to pneumonia/copd/chf-taper steroids DC Maxipime since she has received full course..will hold off on resuming torsemide.she does not appear to be in fluid overload at this time.  aki on ckd-renal functions worsened with diuretics.follow levels.We will again hold diuretics today. Rule out GI bleed.Guaiac stools.To see if this is the cause of her increasing BUN.Her hemoglobin had dropped on the sixth but the repeat hemoglobin on the same day without any intervention came  back up to baseline. She does have baseline chronic anemia. And she is on Coumadin for atrial fibrillation. Her creatinine decreased after holding the diuretics. We will follow-up levels tomorrow.  Type 2 dm add insulin before meals.  afib continue coumadin.  Anemia of chronic disease guaiac stools rule out GI bleed I have started her on Protonix.Follow-up labs tomorrow blood transfusion if hemoglobin drops less than 7.     DVT prophylaxis: Coumadin Code Status: DNR Family Communication: None Disposition Plan: I have consulted physical therapy.  Plan to discharge her tomorrow.  Patient's family would like her to go to skilled nursing facility which is appropriate at this time.  In terms of her renal functions it is improving.  And GI bleed is stable she is not a candidate for scoping.  And cardiac function chance is stable at this time.  Consultants: GI, nephrology, cardiology  Procedures: None  status post Maxipime Antimicrobials:   Subjective: No complaints resting in bed eating breakfast denies chest pain shortness of breath or cough   Objective: Vitals:   04/06/17 0445 04/06/17 0751 04/06/17 0806 04/06/17 0954  BP: 132/68  (!) 152/52 (!) 120/50  Pulse: (!) 58 60 (!) 58 65  Resp: 10 14    Temp: (!) 97.3 F (36.3 C)     TempSrc: Oral     SpO2: 100% 100% 100%   Weight: 73.4 kg (161 lb 12.8 oz)     Height:        Intake/Output Summary (Last 24 hours) at 04/06/2017 1415 Last data filed at 04/06/2017 0900 Gross per 24 hour  Intake 240 ml  Output 700 ml  Net -460 ml   Filed Weights   04/04/17 0519 04/05/17 0650 04/06/17 0445  Weight: 75.8 kg (167 lb) 73.1 kg (161 lb  3.2 oz) 73.4 kg (161 lb 12.8 oz)    Examination:  General exam: Appears calm and comfortable  Respiratory system: Clear to auscultation. Respiratory effort normal. Cardiovascular system: S1 & S2 heard, RRR. No JVD, murmurs, rubs, gallops or clicks. No pedal edema. Gastrointestinal system:  Abdomen is nondistended, soft and nontender. No organomegaly or masses felt. Normal bowel sounds heard. Central nervous system: Alert and oriented. No focal neurological deficits. Extremities: Symmetric 5 x 5 power. Skin: No rashes, lesions or ulcers Psychiatry: Judgement and insight appear normal. Mood & affect appropriate.     Data Reviewed: I have personally reviewed following labs and imaging studies  CBC: Recent Labs  Lab 03/31/17 0431 04/01/17 0647 04/01/17 1901 04/02/17 0333 04/04/17 0354 04/06/17 0400  WBC 12.4* 10.6*  --  8.4 8.9 8.3  NEUTROABS  --   --   --   --  7.8* 7.0  HGB 7.2* 6.8* 8.2* 7.8* 7.5* 7.9*  HCT 23.4* 22.3* 26.0* 25.3* 24.2* 25.5*  MCV 87.3 86.8  --  87.5 88.6 90.7  PLT 265 235  --  235 236 222   Basic Metabolic Panel: Recent Labs  Lab 04/02/17 0333 04/03/17 0511 04/04/17 0354 04/05/17 0443 04/06/17 0400  NA 136 136 136 139 136  K 5.3* 5.4* 5.4* 4.8 4.9  CL 100* 100* 100* 105 102  CO2 27 27 28 27 28   GLUCOSE 292* 179* 207* 150* 205*  BUN 113* 123* 124* 116* 119*  CREATININE 3.02* 2.89* 2.79* 2.71* 2.63*  CALCIUM 8.7* 8.6* 8.4* 8.3* 8.3*   GFR: Estimated Creatinine Clearance: 15.2 mL/min (A) (by C-G formula based on SCr of 2.63 mg/dL (H)). Liver Function Tests: No results for input(s): AST, ALT, ALKPHOS, BILITOT, PROT, ALBUMIN in the last 168 hours. No results for input(s): LIPASE, AMYLASE in the last 168 hours. No results for input(s): AMMONIA in the last 168 hours. Coagulation Profile: Recent Labs  Lab 04/02/17 0333 04/03/17 0511 04/04/17 0354 04/05/17 0443 04/06/17 0400  INR 2.32 1.61 1.82 2.38 3.21   Cardiac Enzymes: No results for input(s): CKTOTAL, CKMB, CKMBINDEX, TROPONINI in the last 168 hours. BNP (last 3 results) No results for input(s): PROBNP in the last 8760 hours. HbA1C: No results for input(s): HGBA1C in the last 72 hours. CBG: Recent Labs  Lab 04/05/17 1142 04/05/17 1714 04/05/17 1957 04/06/17 0758  04/06/17 1140  GLUCAP 220* 241* 363* 139* 216*   Lipid Profile: No results for input(s): CHOL, HDL, LDLCALC, TRIG, CHOLHDL, LDLDIRECT in the last 72 hours. Thyroid Function Tests: No results for input(s): TSH, T4TOTAL, FREET4, T3FREE, THYROIDAB in the last 72 hours. Anemia Panel: No results for input(s): VITAMINB12, FOLATE, FERRITIN, TIBC, IRON, RETICCTPCT in the last 72 hours. Sepsis Labs: No results for input(s): PROCALCITON, LATICACIDVEN in the last 168 hours.  Recent Results (from the past 240 hour(s))  Culture, blood (routine x 2) Call MD if unable to obtain prior to antibiotics being given     Status: None   Collection Time: 03/29/17  2:55 PM  Result Value Ref Range Status   Specimen Description BLOOD LEFT ANTECUBITAL  Final   Special Requests IN PEDIATRIC BOTTLE Blood Culture adequate volume  Final   Culture NO GROWTH 5 DAYS  Final   Report Status 04/03/2017 FINAL  Final  Culture, blood (routine x 2) Call MD if unable to obtain prior to antibiotics being given     Status: None   Collection Time: 03/29/17  3:02 PM  Result Value Ref Range Status  Specimen Description BLOOD LEFT HAND  Final   Special Requests   Final    BOTTLES DRAWN AEROBIC ONLY Blood Culture adequate volume   Culture NO GROWTH 5 DAYS  Final   Report Status 04/03/2017 FINAL  Final  MRSA PCR Screening     Status: None   Collection Time: 03/30/17  7:57 AM  Result Value Ref Range Status   MRSA by PCR NEGATIVE NEGATIVE Final    Comment:        The GeneXpert MRSA Assay (FDA approved for NASAL specimens only), is one component of a comprehensive MRSA colonization surveillance program. It is not intended to diagnose MRSA infection nor to guide or monitor treatment for MRSA infections.          Radiology Studies: No results found.      Scheduled Meds: . atorvastatin  20 mg Oral Daily  . cholecalciferol  1,000 Units Oral q morning - 10a  . escitalopram  10 mg Oral Daily  . feeding supplement  (ENSURE ENLIVE)  237 mL Oral BID BM  . fenofibrate  160 mg Oral Daily  . gabapentin  100 mg Oral QHS  . guaiFENesin  600 mg Oral BID  . hydrALAZINE  75 mg Oral BID  . insulin aspart  0-15 Units Subcutaneous TID WC  . insulin aspart  5 Units Subcutaneous TID WC  . insulin detemir  5 Units Subcutaneous Daily  . ipratropium-albuterol  3 mL Nebulization TID  . isosorbide mononitrate  30 mg Oral Daily  . methimazole  5 mg Oral Daily  . metoprolol tartrate  12.5 mg Oral BID  . multivitamin with minerals  1 tablet Oral Daily  . pantoprazole  40 mg Oral Daily  . polyethylene glycol  17 g Oral BID  . predniSONE  30 mg Oral Q breakfast  . senna  1 tablet Oral QHS  . sodium chloride flush  3 mL Intravenous Q12H  . Warfarin - Pharmacist Dosing Inpatient   Does not apply q1800   Continuous Infusions: . sodium chloride Stopped (03/29/17 0150)     LOS: 17 days      Alwyn Ren, MD [facility]  If 7PM-7AM, please contact night-coverage [email or web address] Password [email or web address] 04/06/2017, 2:15 PM

## 2017-04-06 NOTE — Progress Notes (Signed)
ANTICOAGULATION CONSULT NOTE   Pharmacy Consult for Warfarin Indication: atrial fibrillation  Labs: Recent Labs    04/04/17 0354 04/05/17 0443 04/06/17 0400  HGB 7.5*  --  7.9*  HCT 24.2*  --  25.5*  PLT 236  --  222  LABPROT 20.9* 25.8* 32.5*  INR 1.82 2.38 3.21  CREATININE 2.79* 2.71* 2.63*    Estimated Creatinine Clearance: 15.2 mL/min (A) (by C-G formula based on SCr of 2.63 mg/dL (H)).  Assessment: 82 YOF on warfarin PTA for AFib - Home dose (as per [facility] note on 10/12) was 4mg  daily except 6mg  on Mondays - dose was reduced to that regimen during that appointment d/t INR of 3.6 in the clinic. CHADSVASc score 6  INR is supratherapeutic today at 3.21 which may be due to the higher dosing used two days prior and the drug interactions. Will hold tonight's dose in anticipation of an upward trend in her INR. CBC is stable, no bleeding noted.  Drug interactions include fenofibrate and methimazole   Goal of Therapy:  INR 2-3 Monitor platelets by anticoagulation protocol: Yes   Plan:  Hold Warfarin x1 Daily INR   Nolen Mu PharmD PGY1 Pharmacy Practice Resident 04/06/2017 10:45 AM Pager: [phone] Phone: [phone]

## 2017-04-06 NOTE — Progress Notes (Signed)
Progress Note  Patient Name: Alexis [name] Date of Encounter: 04/06/2017  Primary Cardiologist: Dr. Zoila Shutter  Subjective   Eating breakfast.  Reports no chest pain, breathing comfortably.  No palpitations.  Inpatient Medications    Scheduled Meds: . atorvastatin  20 mg Oral Daily  . cholecalciferol  1,000 Units Oral q morning - 10a  . escitalopram  10 mg Oral Daily  . feeding supplement (ENSURE ENLIVE)  237 mL Oral BID BM  . fenofibrate  160 mg Oral Daily  . gabapentin  100 mg Oral QHS  . guaiFENesin  600 mg Oral BID  . hydrALAZINE  75 mg Oral BID  . insulin aspart  0-15 Units Subcutaneous TID WC  . insulin aspart  5 Units Subcutaneous TID WC  . insulin detemir  5 Units Subcutaneous Daily  . ipratropium-albuterol  3 mL Nebulization TID  . isosorbide mononitrate  30 mg Oral Daily  . methimazole  5 mg Oral Daily  . metoprolol tartrate  12.5 mg Oral BID  . multivitamin with minerals  1 tablet Oral Daily  . pantoprazole  40 mg Oral Daily  . polyethylene glycol  17 g Oral BID  . predniSONE  30 mg Oral Q breakfast  . senna  1 tablet Oral QHS  . sodium chloride flush  3 mL Intravenous Q12H  . Warfarin - Pharmacist Dosing Inpatient   Does not apply q1800   Continuous Infusions: . sodium chloride Stopped (03/29/17 0150)   PRN Meds: sodium chloride, acetaminophen **OR** acetaminophen, albuterol, ondansetron **OR** ondansetron (ZOFRAN) IV, sodium chloride flush, traZODone   Vital Signs    Vitals:   04/06/17 0007 04/06/17 0445 04/06/17 0751 04/06/17 0806  BP: (!) 133/57 132/68  (!) 152/52  Pulse: 62 (!) 58 60 (!) 58  Resp: 12 10 14    Temp: 97.8 F (36.6 C) (!) 97.3 F (36.3 C)    TempSrc: Oral Oral    SpO2: 100% 100% 100% 100%  Weight:  161 lb 12.8 oz (73.4 kg)    Height:        Intake/Output Summary (Last 24 hours) at 04/06/2017 0836 Last data filed at 04/06/2017 0500 Gross per 24 hour  Intake 120 ml  Output 700 ml  Net -580 ml   Filed Weights     04/04/17 0519 04/05/17 0650 04/06/17 0445  Weight: 167 lb (75.8 kg) 161 lb 3.2 oz (73.1 kg) 161 lb 12.8 oz (73.4 kg)    Telemetry    Rate controlled atrial fibrillation.  Personally reviewed.  Physical Exam   GEN:  Elderly woman.  No acute distress.   Neck: No JVD. Cardiac:  Irregularly irregular, no gallop.  Respiratory: Nonlabored. Clear to auscultation bilaterally. GI: Soft, nontender, bowel sounds present. MS: No edema; No deformity. Neuro:  Nonfocal. Psych: Alert and oriented x 3. Normal affect.  Hearing loss.  Labs    Chemistry Recent Labs  Lab 04/04/17 0354 04/05/17 0443 04/06/17 0400  NA 136 139 136  K 5.4* 4.8 4.9  CL 100* 105 102  CO2 28 27 28   GLUCOSE 207* 150* 205*  BUN 124* 116* 119*  CREATININE 2.79* 2.71* 2.63*  CALCIUM 8.4* 8.3* 8.3*  GFRNONAA 15* 15* 16*  GFRAA 17* 18* 18*  ANIONGAP 8 7 6      Hematology Recent Labs  Lab 04/02/17 0333 04/04/17 0354 04/06/17 0400  WBC 8.4 8.9 8.3  RBC 2.89* 2.73* 2.81*  HGB 7.8* 7.5* 7.9*  HCT 25.3* 24.2* 25.5*  MCV 87.5 88.6 90.7  MCH 27.0 27.5 28.1  MCHC 30.8 31.0 31.0  RDW 16.1* 16.6* 17.7*  PLT 235 236 222    Radiology    Dg Swallowing Func-speech Pathology  Result Date: 04/04/2017 Objective Swallowing Evaluation: Type of Study: MBS-Modified Barium Swallow Study  Patient Details Name: Alexis [name] MRN: [id number] Date of Birth: May 14, 1932 Today's Date: 04/04/2017 Time: SLP Start Time (ACUTE ONLY): 4098 -SLP Stop Time (ACUTE ONLY): 0918 SLP Time Calculation (min) (ACUTE ONLY): 15 min Past Medical History: Past Medical History: Diagnosis Date . Anxiety  . Chronic diastolic CHF (congestive heart failure) (HCC)   a. 05/2015 Echo: EF 55-60%, mild LVH, no rwma, mild MR, mildly dil RA, mod TR, PASP . . CKD (chronic kidney disease), stage IV (HCC)  . Confusion state  . COPD (chronic obstructive pulmonary disease) (HCC)   a. on home O2 @ 3lpm. . Elevated troponin   a. H/o elevated trop in setting of  CHF; b. 06/2013 Lexiscan MV: nl EF, mild anterior breast attenuation, no ischemia.  . Family history of adverse reaction to anesthesia  . GIB (gastrointestinal bleeding)   a. History of GIB. Marland Kitchen Hypertensive heart disease  . Macular degeneration, bilateral  . Myocardial infarction (HCC)  . Neuropathy  . Paroxysmal atrial fibrillation (HCC)   a. CHA2DS2VASc = 6-->coumadin. Marland Kitchen PONV (postoperative nausea and vomiting)  . Type II diabetes mellitus (HCC)  . Varicose veins  Past Surgical History: Past Surgical History: Procedure Laterality Date . ABDOMINAL HYSTERECTOMY   . FRACTURE SURGERY   . HIP FRACTURE SURGERY  2004 . VEIN SURGERY   HPI: [name] a38 y.o.female,with past medical history relevant for diastolic dysfunction CHF, COPD on 2-3 L of oxygen at home, DM, MI admitted with a 2 to three-day history of worsening shortness of breath, orthopnea and increased oxygen requirement.CXR 11/3 findings are similar to the most recent prior exam with bilateral patchy airspace lung opacities. Name opacity at the lung bases is likely due to the development of small pleural effusions. Findings are consistent with either asymmetric pulmonary edema or multifocal pneumonia. MBS 02/28/16 functional swallow, slight oral holding; barium pill hesitated mid esophagus and transited iwth additional puree. Reg/thin recommended. Given family reports of frequent coughing with/after meals and COPD, proceed with MBS.  No Data Recorded Assessment / Plan / Recommendation CHL IP CLINICAL IMPRESSIONS 04/04/2017 Clinical Impression MBS results same per documentation as prior study 02/2016. No significant impairments with oral control, manipulation or transit. Pharyngeal phase intact with timely initiation of protective mechanisms, hyolaryngeal excursion and elevation and epiglottic deflection. No pharyngeal residue. Pill observed to stop in mid esophagus with sudden retrograde movment of barium close to pharynx requiring puree to  assist in transit through GE junction (thin barium ineffective). Pt prefers to remain on Dys 2 texture; recommend continue thin liquids and bite puree following pills to ensure transit through esophagus. Esophageal precautions (remain upright after meals 45 min to 1 hour, smaller meals more frequently, elevate head at night). No further ST needed.   SLP Visit Diagnosis Dysphagia, unspecified (R13.10) Attention and concentration deficit following -- Frontal lobe and executive function deficit following -- Impact on safety and function (No Data)   CHL IP TREATMENT RECOMMENDATION 04/04/2017 Treatment Recommendations No treatment recommended at this time   Prognosis 03/31/2017 Prognosis for Safe Diet Advancement Good Barriers to Reach Goals -- Barriers/Prognosis Comment -- CHL IP DIET RECOMMENDATION 04/04/2017 SLP Diet Recommendations Dysphagia 2 (Fine chop) solids;Thin liquid Liquid Administration via Cup;Straw Medication Administration Whole meds with liquid Compensations Slow  rate;Small sips/bites Postural Changes Seated upright at 90 degrees;Remain semi-upright after after feeds/meals (Comment)   CHL IP OTHER RECOMMENDATIONS 04/04/2017 Recommended Consults -- Oral Care Recommendations Oral care BID Other Recommendations --   CHL IP FOLLOW UP RECOMMENDATIONS 04/04/2017 Follow up Recommendations None   CHL IP FREQUENCY AND DURATION 03/31/2017 Speech Therapy Frequency (ACUTE ONLY) min 2x/week Treatment Duration 2 weeks      CHL IP ORAL PHASE 04/04/2017 Oral Phase WFL Oral - Pudding Teaspoon -- Oral - Pudding Cup -- Oral - Honey Teaspoon -- Oral - Honey Cup -- Oral - Nectar Teaspoon -- Oral - Nectar Cup -- Oral - Nectar Straw -- Oral - Thin Teaspoon -- Oral - Thin Cup -- Oral - Thin Straw -- Oral - Puree -- Oral - Mech Soft -- Oral - Regular -- Oral - Multi-Consistency -- Oral - Pill -- Oral Phase - Comment --  CHL IP PHARYNGEAL PHASE 04/04/2017 Pharyngeal Phase WFL Pharyngeal- Pudding Teaspoon -- Pharyngeal -- Pharyngeal-  Pudding Cup -- Pharyngeal -- Pharyngeal- Honey Teaspoon -- Pharyngeal -- Pharyngeal- Honey Cup -- Pharyngeal -- Pharyngeal- Nectar Teaspoon -- Pharyngeal -- Pharyngeal- Nectar Cup -- Pharyngeal -- Pharyngeal- Nectar Straw -- Pharyngeal -- Pharyngeal- Thin Teaspoon -- Pharyngeal -- Pharyngeal- Thin Cup -- Pharyngeal -- Pharyngeal- Thin Straw -- Pharyngeal -- Pharyngeal- Puree -- Pharyngeal -- Pharyngeal- Mechanical Soft -- Pharyngeal -- Pharyngeal- Regular -- Pharyngeal -- Pharyngeal- Multi-consistency -- Pharyngeal -- Pharyngeal- Pill -- Pharyngeal -- Pharyngeal Comment --  CHL IP CERVICAL ESOPHAGEAL PHASE 04/04/2017 Cervical Esophageal Phase WFL Pudding Teaspoon -- Pudding Cup -- Honey Teaspoon -- Honey Cup -- Nectar Teaspoon -- Nectar Cup -- Nectar Straw -- Thin Teaspoon -- Thin Cup -- Thin Straw -- Puree -- Mechanical Soft -- Regular -- Multi-consistency -- Pill -- Cervical Esophageal Comment -- No flowsheet data found. Royce Macadamia 04/04/2017, 10:38 AM Breck Coons Lonell Face.Ed CCC-SLP Pager [phone]               Cardiac Studies   Echocardiogram 03/21/2017: Study Conclusions  - Left ventricle: The cavity size was normal. Wall thickness was normal. Systolic function was vigorous. The estimated ejection fraction was in the range of 65% to 70%. Wall motion was normal; there were no regional wall motion abnormalities. Features are consistent with a pseudonormal left ventricular filling pattern, with concomitant abnormal relaxation and increased filling pressure (grade 2 diastolic dysfunction). - Aortic valve: Trileaflet; moderately thickened, moderately calcified leaflets. There was mild stenosis. Peak velocity (S): 276 cm/s. Mean gradient (S): 15 mm Hg. Valve area (VTI): 1.69 cm^2. Valve area (Vmax): 1.76 cm^2. Valve area (Vmean): 1.64 cm^2. - Mitral valve: Moderately calcified annulus. Mildly thickened leaflets . There was moderate regurgitation. - Left atrium:  The atrium was moderately to severely dilated. Volume/bsa, S: 49.5 ml/m^2. - Right atrium: The atrium was moderately dilated. - Tricuspid valve: There was moderate regurgitation. - Pulmonary arteries: Systolic pressure was severely increased. PA peak pressure: 70 mm Hg (S).  Impressions:  - Compared to the prior study, there has been no significant interval change  Patient Profile     83 y.o. female with a history of persistent atrial fibrillation, COPD, CKD stage IV, and chronic diastolic heart failure, now admitted with multifactorial shortness of breath and fatigue.  Assessment & Plan    1.  Acute on chronic diastolic heart failure.  Clinically improved and weight stable despite holding diuretics due to acute on chronic renal insufficiency.  Net output approximately 600 cc last 24-hours.  2.  Persistent atrial fibrillation.  Heart rate remains well controlled on Lopressor.  She is on Coumadin for stroke prophylaxis, followed by pharmacy.  INR 3.2.  3.  COPD exacerbation, management per primary team.  4.  Acute on chronic renal failure with CKD stage IV at baseline.  Creatinine down to 2.6, diuretics have been held.  No changes to current cardiac regimen which includes Lipitor, fenofibrate, hydralazine, Imdur, Lopressor, and Coumadin.  Signed, Nona DellSamuel Belton Peplinski, MD  04/06/2017, 8:36 AM

## 2017-04-07 LAB — BASIC METABOLIC PANEL
Anion gap: 8 (ref 5–15)
BUN: 107 mg/dL — ABNORMAL HIGH (ref 6–20)
CALCIUM: 8.4 mg/dL — AB (ref 8.9–10.3)
CO2: 24 mmol/L (ref 22–32)
CREATININE: 2.41 mg/dL — AB (ref 0.44–1.00)
Chloride: 106 mmol/L (ref 101–111)
GFR calc Af Amer: 20 mL/min — ABNORMAL LOW (ref >60)
GFR calc non Af Amer: 17 mL/min — ABNORMAL LOW (ref >60)
Glucose, Bld: 169 mg/dL — ABNORMAL HIGH (ref 65–99)
Potassium: 5.1 mmol/L (ref 3.5–5.1)
SODIUM: 138 mmol/L (ref 135–145)

## 2017-04-07 LAB — PROTIME-INR
INR: 2.67
PROTHROMBIN TIME: 28.2 s — AB (ref 11.4–15.2)

## 2017-04-07 LAB — GLUCOSE, CAPILLARY
GLUCOSE-CAPILLARY: 73 mg/dL (ref 65–99)
GLUCOSE-CAPILLARY: 120 mg/dL — AB (ref 65–99)
Glucose-Capillary: 107 mg/dL — ABNORMAL HIGH (ref 65–99)
Glucose-Capillary: 141 mg/dL — ABNORMAL HIGH (ref 65–99)

## 2017-04-07 MED ORDER — WARFARIN SODIUM 4 MG PO TABS
4.0000 mg | ORAL_TABLET | Freq: Once | ORAL | Status: AC
  Administered 2017-04-07: 4 mg via ORAL
  Filled 2017-04-07: qty 1

## 2017-04-07 NOTE — Progress Notes (Signed)
04/07/17 1207  PT Visit Information  Last PT Received On 04/07/17  Assistance Needed +1  History of Present Illness Pt is an 83 y.o. female admitted 03/20/17 with SOB, orthopnea, weight gain, increased O2 requirement.  Patient with CHF exacerbation and UTI.   PMH:  CHF, PAF, DM, neuropathy, COPD on home O2 at 2L, HTN, CKD, anemia, macular degeneration, confusion.   Subjective Data  Patient Stated Goal To return home  Precautions  Precautions Fall  Precaution Comments pt on 4L via [place]  Restrictions  Weight Bearing Restrictions No  Pain Assessment  Pain Assessment No/denies pain  Cognition  Arousal/Alertness Awake/alert  Behavior During Therapy WFL for tasks assessed/performed  Overall Cognitive Status Impaired/Different from baseline  Area of Impairment Memory  Memory Decreased short-term memory  General Comments Pt repeating story concerning receiving her new bed multiple times.   Bed Mobility  General bed mobility comments In chair upon entry.   Transfers  Overall transfer level Needs assistance  Equipment used Rolling walker (2 wheeled)  Transfers Sit to/from [employer]  Sit to Stand Min assist  Stand pivot transfers Min guard  General transfer comment Min A for lift assist to stand and steadying. Min guard to perform stand pivot transfer to [facility] from recliner following gait. Required verbal cues for sequencing and proper use of RW during transfer as pt wanting to leave walker behind.   Ambulation/Gait  Ambulation/Gait assistance Min guard;Min assist  Ambulation Distance (Feet) 25 Feet  Assistive device Rolling walker (2 wheeled)  Gait Pattern/deviations Step-through pattern;Decreased step length - right;Decreased step length - left;Wide base of support  General Gait Details Slow, unsteady gait. Pt fatigued from OT session, so requesting to ambulate only in the room this session. Oxygen sats >90% on 4L of oxygen. Required min to min guard assist for steadying.  Verbal cues for upright posture and sequencing using RW. Also required cues for proximity to device.   Gait velocity Decreased   Gait velocity interpretation Below normal speed for age/gender  Balance  Overall balance assessment Needs assistance  Sitting-balance support No upper extremity supported;Feet supported  Sitting balance-Leahy Scale Good  Standing balance support Bilateral upper extremity supported;During functional activity  Standing balance-Leahy Scale Poor  Standing balance comment Reliant on RW for stability   General Comments  General comments (skin integrity, edema, etc.) VSS throughout. Pt's son present during session. Concerned about pt going home as he will not be able to provide 24/7. Educated about SNF and pt agreeable.   Exercises  Exercises General Lower Extremity  General Exercises - Lower Extremity  Ankle Circles/Pumps AROM;Both;20 reps  Long Arc Quad AROM;Both;10 reps  Hip Flexion/Marching AROM;Both;Seated  PT - End of Session  Equipment Utilized During Treatment Gait belt;Oxygen  Activity Tolerance Patient tolerated treatment well  Patient left in chair;with call bell/phone within reach;with chair alarm set  Nurse Communication Mobility status  PT - Assessment/Plan  PT Plan Discharge plan needs to be updated;Frequency needs to be updated  PT Visit Diagnosis Unsteadiness on feet (R26.81);Muscle weakness (generalized) (M62.81)  PT Frequency (ACUTE ONLY) Min 2X/week  Recommendations for Other Services OT consult  Follow Up Recommendations SNF;Supervision/Assistance - 24 hour  PT equipment None recommended by PT  AM-PAC PT "6 Clicks" Daily Activity Outcome Measure  Difficulty turning over in bed (including adjusting bedclothes, sheets and blankets)? 3  Difficulty moving from lying on back to sitting on the side of the bed?  1  Difficulty sitting down on and standing up from  a chair with arms (e.g., wheelchair, bedside commode, etc,.)? 1  Help needed moving to  and from a bed to chair (including a wheelchair)? 3  Help needed walking in hospital room? 3  Help needed climbing 3-5 steps with a railing?  2  6 Click Score 13  Mobility G Code  CK  PT Goal Progression  Progress towards PT goals Progressing toward goals  Acute Rehab PT Goals  PT Goal Formulation With patient  Time For Goal Achievement 04/18/17  Potential to Achieve Goals Good  PT Time Calculation  PT Start Time (ACUTE ONLY) 1052  PT Stop Time (ACUTE ONLY) 1120  PT Time Calculation (min) (ACUTE ONLY) 28 min  PT General Charges  $$ ACUTE PT VISIT 1 Visit  PT Treatments  $Gait Training 8-22 mins  $Therapeutic Exercise 8-22 mins   Pt slowly progressing towards goals. Pt just working with OT and reports she is tired, so only able to tolerate short distance in room. Required min guard to min A throughout mobility with use of RW. VSS throughout session and oxygen >90% on 4L during mobility. Pt and son concerned about pt going home given current limitations. Updated recommendations to SNF to increase independence and safety with mobility prior to return home. Will continue to follow acutely to maximize functional mobility independence and safety.   Gladys DammeBrittany Tobin Witucki, PT, DPT  Acute Rehabilitation Services  Pager: [phone]

## 2017-04-07 NOTE — Progress Notes (Signed)
Progress Note  Patient Name: Alexis GivensChristine E [name] Date of Encounter: 04/07/2017  Primary Cardiologist: Dr. Zoila ShutterKenneth Hilty  Subjective   Pt says breathing ok, says weighing herself at home, cannot remember any weights.  Was not ambulating much PTA, uses walker.  Inpatient Medications    Scheduled Meds: . atorvastatin  20 mg Oral Daily  . cholecalciferol  1,000 Units Oral q morning - 10a  . escitalopram  10 mg Oral Daily  . feeding supplement (ENSURE ENLIVE)  237 mL Oral BID BM  . fenofibrate  160 mg Oral Daily  . gabapentin  100 mg Oral QHS  . guaiFENesin  600 mg Oral BID  . hydrALAZINE  75 mg Oral BID  . insulin aspart  0-15 Units Subcutaneous TID WC  . insulin aspart  5 Units Subcutaneous TID WC  . insulin detemir  5 Units Subcutaneous Daily  . ipratropium-albuterol  3 mL Nebulization TID  . isosorbide mononitrate  30 mg Oral Daily  . methimazole  5 mg Oral Daily  . metoprolol tartrate  12.5 mg Oral BID  . multivitamin with minerals  1 tablet Oral Daily  . pantoprazole  40 mg Oral Daily  . polyethylene glycol  17 g Oral BID  . predniSONE  30 mg Oral Q breakfast  . senna  1 tablet Oral QHS  . sodium chloride flush  3 mL Intravenous Q12H  . Warfarin - Pharmacist Dosing Inpatient   Does not apply q1800   Continuous Infusions: . sodium chloride Stopped (03/29/17 0150)   PRN Meds: sodium chloride, acetaminophen **OR** acetaminophen, albuterol, ondansetron **OR** ondansetron (ZOFRAN) IV, sodium chloride flush, traZODone   Vital Signs    Vitals:   04/07/17 0042 04/07/17 0545 04/07/17 0733 04/07/17 0742  BP: 129/66 (!) 147/61 (!) 152/70 (!) 149/75  Pulse: 66 (!) 58 71 67  Resp: 12 14 13 18   Temp: (!) 97.5 F (36.4 C) 97.6 F (36.4 C)  97.6 F (36.4 C)  TempSrc: Oral Oral  Oral  SpO2: 99% 97% 97% 98%  Weight:  168 lb 10.4 oz (76.5 kg)    Height:        Intake/Output Summary (Last 24 hours) at 04/07/2017 0810 Last data filed at 04/07/2017 0044 Gross per 24  hour  Intake 240 ml  Output 550 ml  Net -310 ml   Filed Weights   04/05/17 0650 04/06/17 0445 04/07/17 0545  Weight: 161 lb 3.2 oz (73.1 kg) 161 lb 12.8 oz (73.4 kg) 168 lb 10.4 oz (76.5 kg)    Telemetry    Rate controlled atrial fibrillation.  Personally reviewed.  Physical Exam   GEN:  Elderly woman.  No acute distress.   Neck: JVD 9-10 cm Cardiac:  Irregularly irregular, no gallop.  Respiratory: Nonlabored. Rales bases bilaterally. GI: Soft, nontender, bowel sounds present. MS: No edema; No deformity. Neuro:  Nonfocal. Psych: Alert and oriented x 3. Normal affect.  Hearing loss.  Labs    Chemistry Recent Labs  Lab 04/04/17 0354 04/05/17 0443 04/06/17 0400  NA 136 139 136  K 5.4* 4.8 4.9  CL 100* 105 102  CO2 28 27 28   GLUCOSE 207* 150* 205*  BUN 124* 116* 119*  CREATININE 2.79* 2.71* 2.63*  CALCIUM 8.4* 8.3* 8.3*  GFRNONAA 15* 15* 16*  GFRAA 17* 18* 18*  ANIONGAP 8 7 6      Hematology Recent Labs  Lab 04/02/17 0333 04/04/17 0354 04/06/17 0400  WBC 8.4 8.9 8.3  RBC 2.89* 2.73* 2.81*  HGB  7.8* 7.5* 7.9*  HCT 25.3* 24.2* 25.5*  MCV 87.5 88.6 90.7  MCH 27.0 27.5 28.1  MCHC 30.8 31.0 31.0  RDW 16.1* 16.6* 17.7*  PLT 235 236 222   Lab Results  Component Value Date   INR 2.67 04/07/2017   INR 3.21 04/06/2017   INR 2.38 04/05/2017     Radiology    No results found.  Cardiac Studies   Echocardiogram 03/21/2017: Study Conclusions  - Left ventricle: The cavity size was normal. Wall thickness was normal. Systolic function was vigorous. The estimated ejection fraction was in the range of 65% to 70%. Wall motion was normal; there were no regional wall motion abnormalities. Features are consistent with a pseudonormal left ventricular filling pattern, with concomitant abnormal relaxation and increased filling pressure (grade 2 diastolic dysfunction). - Aortic valve: Trileaflet; moderately thickened, moderately calcified leaflets.  There was mild stenosis. Peak velocity (S): 276 cm/s. Mean gradient (S): 15 mm Hg. Valve area (VTI): 1.69 cm^2. Valve area (Vmax): 1.76 cm^2. Valve area (Vmean): 1.64 cm^2. - Mitral valve: Moderately calcified annulus. Mildly thickened leaflets . There was moderate regurgitation. - Left atrium: The atrium was moderately to severely dilated. Volume/bsa, S: 49.5 ml/m^2. - Right atrium: The atrium was moderately dilated. - Tricuspid valve: There was moderate regurgitation. - Pulmonary arteries: Systolic pressure was severely increased. PA peak pressure: 70 mm Hg (S).  Impressions:  - Compared to the prior study, there has been no significant interval change  Patient Profile     83 y.o. female with a history of persistent atrial fibrillation, COPD, CKD stage IV, and chronic diastolic heart failure, admitted 10/25 with multifactorial shortness of breath and fatigue.  Assessment & Plan    1.  Acute on chronic diastolic heart failure. - net neg 310 CC 24 hr but feel I/O is incomplete - since admit net neg 5.2 L - diuretics held 2nd AoC CKD - weight nadir 144 on 10/26, now 168 - plan is for d/c today, SNF recommended, agree - will need to see in early f/u - home dose torsemide was 40 mg qd - would d/c her on torsemide 60 mg qd  2.  Persistent atrial fibrillation.  - HR generally controlled - occ HR 50s, asymptomatic - on coumadin, INR therapeutic - on Lopressor  3.  COPD exacerbation - Home O2 at 2-3 lpm - per IM  4.  Acute on chronic renal failure with CKD stage IV at baseline.   - diuretics on hold  - Peak Cr 3.02, 11/11 2.63 - Peak BUN 124, 11/11 119 - recheck at f/u appt  Otherwise, per IM Principal Problem:   COPD with acute exacerbation (HCC) Active Problems:   COPD (chronic obstructive pulmonary disease) (HCC)   Paroxysmal atrial fibrillation (HCC)   Long term current use of anticoagulant therapy   Chronic respiratory failure with hypoxia  (HCC)   Type II diabetes mellitus (HCC)   AKI (acute kidney injury) (HCC)   CKD (chronic kidney disease), stage IV (HCC)   Acute on chronic diastolic CHF (congestive heart failure) (HCC)   Severe pulmonary arterial systolic hypertension (HCC)   Acute on chronic respiratory failure with hypoxia (HCC)   Signed, Barrett, Rhonda, PA-C  04/07/2017, 8:10 AM     History and all data above reviewed.  Patient examined.  I agree with the findings as above.  The patient exam reveals COR:RRR  ,  Lungs: Clear  ,  Abd: Positive bowel sounds, no rebound no guarding, Ext No edema  .  All available labs, radiology testing, previous records reviewed. Agree with documented assessment and plan. Acute on chronic diastolic HF:  Her volume status is probably as optimal as it is going to get.  I agree that she would receive no further benefit from in patient therapy although she needs close follow up as an out patient or she will have a high risk of readmission.  I agree that she needs some PO diuretic after discharge and I agree with the above.  She will follow up of her creat.  This could be followed by the providers overseeing her at the SNF.  I would suggest follow up BMET this week.  We have arranged TOC follow in 7 days in our office.    Fayrene Fearing [facility]  10:48 AM  04/07/2017

## 2017-04-07 NOTE — Progress Notes (Signed)
ANTICOAGULATION CONSULT NOTE   Pharmacy Consult for Warfarin Indication: atrial fibrillation  Labs: Recent Labs    04/05/17 0443 04/06/17 0400 04/07/17 0339  HGB  --  7.9*  --   HCT  --  25.5*  --   PLT  --  222  --   LABPROT 25.8* 32.5* 28.2*  INR 2.38 3.21 2.67  CREATININE 2.71* 2.63*  --     Estimated Creatinine Clearance: 15.5 mL/min (A) (by C-G formula based on SCr of 2.63 mg/dL (H)).  Assessment: 83 YOF on warfarin PTA for AFib - Home dose (as per Coumadin Clinic note on 10/12) was 4mg  daily except 6mg  on Mondays - dose was reduced to that regimen during that appointment d/t INR of 3.6 in the clinic. CHADSVASc score 6  INR is 2.67 today - within goal S/P holding x 1 on 11/11  Drug interactions include fenofibrate and methimazole   Goal of Therapy:  INR 2-3 Monitor platelets by anticoagulation protocol: Yes   Plan:  Warfarin 4 mg x 1 Daily INR  Isaac Bliss, PharmD, BCPS, BCCCP Clinical Pharmacist Clinical phone for 04/07/2017 from 7a-3:30p: [phone] If after 3:30p, please call main pharmacy at: [phone] 04/07/2017 9:07 AM

## 2017-04-07 NOTE — Discharge Summary (Deleted)
Patient is discharged home with huber needle left in [facility].

## 2017-04-07 NOTE — Progress Notes (Signed)
Occupational Therapy Treatment Patient Details Name: Alexis [name] MRN: [id number] DOB: Feb 09, 1932 Today's Date: 04/07/2017    History of present illness Pt is an 83 y.o. female admitted 03/20/17 with SOB, orthopnea, weight gain, increased O2 requirement.  Patient with CHF exacerbation and UTI.   PMH:  CHF, PAF, DM, neuropathy, COPD on home O2 at 2L, HTN, CKD, anemia, macular degeneration, confusion.    OT comments  Pt requiring increased assistance with LB ADL as well as toilet transfers this session requiring max assist to don socks after episode of urinary incontinence. She was able to stand for pericare with min guard assist this session for balance. Pt additionally demonstrating decreased short-term memory this session. Pt will need 24 hour assistance post-acute D/C. At current functional level recommend short-term SNF level rehabilitation to maximize return to independence. Updated D/C recommendation appropriately.    Follow Up Recommendations  SNF;Supervision/Assistance - 24 hour    Equipment Recommendations  None recommended by OT    Recommendations for Other Services      Precautions / Restrictions Precautions Precautions: Fall Precaution Comments: pt on 4L via [place] Restrictions Weight Bearing Restrictions: No       Mobility Bed Mobility Overal bed mobility: Needs Assistance Bed Mobility: Supine to Sit     Supine to sit: HOB elevated;Min guard     General bed mobility comments: Min guard assist for safety.   Transfers Overall transfer level: Needs assistance Equipment used: Rolling walker (2 wheeled) Transfers: Sit to/from [employer] Sit to Stand: Min assist         General transfer comment: Min assist to power up this session with VC's for safe hand placement.     Balance Overall balance assessment: Needs assistance Sitting-balance support: No upper extremity supported;Feet supported Sitting balance-Leahy Scale: Good      Standing balance support: Bilateral upper extremity supported;During functional activity Standing balance-Leahy Scale: Fair Standing balance comment: Able to statically stand without UE support.                            ADL either performed or assessed with clinical judgement   ADL Overall ADL's : Needs assistance/impaired Eating/Feeding: Independent       Upper Body Bathing: Minimal assistance;Sitting   Lower Body Bathing: Moderate assistance;Sit to/from stand   Upper Body Dressing : Minimal assistance;Sitting   Lower Body Dressing: Maximal assistance;Sit to/from stand   Toilet Transfer: Minimal assistance;RW   Toileting- Clothing Manipulation and Hygiene: Min guard;Sit to/from stand       Functional mobility during ADLs: Rolling walker;Minimal assistance General ADL Comments: Pt with increased difficulty with LB ADL this session.      Vision       Perception     Praxis      Cognition Arousal/Alertness: Awake/alert Behavior During Therapy: WFL for tasks assessed/performed Overall Cognitive Status: Impaired/Different from baseline Area of Impairment: Memory                     Memory: Decreased short-term memory         General Comments: Pt repeating story concerning receiving her new bed multiple times.         Exercises     Shoulder Instructions       General Comments VSS throughout session.     Pertinent Vitals/ Pain       Pain Assessment: No/denies pain  Home Living  Prior Functioning/Environment              Frequency  Min 2X/week        Progress Toward Goals  OT Goals(current goals can now be found in the care plan section)  Progress towards OT goals: Progressing toward goals  Acute Rehab OT Goals Patient Stated Goal: To return home OT Goal Formulation: With patient Time For Goal Achievement: 04/14/17 Potential to Achieve Goals: Good  Plan  Discharge plan needs to be updated    Co-evaluation                 AM-PAC PT "6 Clicks" Daily Activity     Outcome Measure   Help from another person eating meals?: None Help from another person taking care of personal grooming?: A Little Help from another person toileting, which includes using toliet, bedpan, or urinal?: A Little Help from another person bathing (including washing, rinsing, drying)?: A Lot Help from another person to put on and taking off regular upper body clothing?: A Little Help from another person to put on and taking off regular lower body clothing?: A Lot 6 Click Score: 17    End of Session Equipment Utilized During Treatment: Rolling walker;Oxygen(4L O2)  OT Visit Diagnosis: Muscle weakness (generalized) (M62.81)   Activity Tolerance Patient limited by fatigue   Patient Left in chair;with call bell/phone within reach;with nursing/sitter in room   Nurse Communication Mobility status(pt needs Pure Whick placed.)        Time: [id number] OT Time Calculation (min): 35 min  Charges: OT General Charges $OT Visit: 1 Visit OT Treatments $Self Care/Home Management : 23-37 mins  Doristine Sectionharity A Miles Leyda, MS OTR/L  Pager: [phone]    Terrall Bley A [place] Souter 04/07/2017, 10:55 AM

## 2017-04-07 NOTE — Progress Notes (Signed)
PROGRESS NOTE    Alexis [name]  ZOX:[id number] DOB: Jan 16, 1932 DOA: 03/20/2017 PCP: [name], PA-C  Brief Narrative: 83 year old woman PMH diastolic heart failure, oxygen dependent COPD presented with increasing shortness of breath, orthopnea and worsening hypoxia. Admitted for acute on chronic diastolic congestive heart failure, acute on chronic hypoxic respiratory failure. Gradually improved with diuresis but developed AKI prolonging hospitalization. Renal function peaked but then developed acute COPD exacerbation, possible pneumoniaand recurrent CHF necessitating BiPAP and transfer to SDU. Condition now improving, COPD stabilizing and renal function appears stable.  Patient resting in bed today.denies any complaints of chest pain shortness of breath.reviewed cardiology noteS.    Assessment & Plan:   Principal Problem:   COPD with acute exacerbation (HCC) Active Problems:   COPD (chronic obstructive pulmonary disease) (HCC)   Paroxysmal atrial fibrillation (HCC)   Long term current use of anticoagulant therapy   Chronic respiratory failure with hypoxia (HCC)   Type II diabetes mellitus (HCC)   AKI (acute kidney injury) (HCC)   CKD (chronic kidney disease), stage IV (HCC)   Acute on chronic diastolic CHF (congestive heart failure) (HCC)   Severe pulmonary arterial systolic hypertension (HCC)   Acute on chronic respiratory failure with hypoxia (HCC)   Acute on chronic hypoxic respiratory failure due to pneumonia/copd/chf-taper steroidsDC Maxipime since she has received full course..will hold off on resuming torsemide.she does not appear to be in fluid overload at this time.  aki on ckd-renal functions worsened with diuretics.follow levels.We will again hold diuretics today.  Type 2 dm add insulin before meals.  afib continue coumadin.  Anemia of chronic disease guaiac stools rule out GI bleed I have started her on Protonix.Follow-up labs tomorrow blood  transfusion if hemoglobin drops less than7.    DVT prophylaxis: COUMADIN Code StatusDNR : Family Communication NONE Disposition Plan:  DW DAUGHTER.PALLIATIVE CARE CONSULT PLACED.HOPE TO DC TOMORROW WITH PA.LLIATIVE/HOSPICE?  Consultants  CARDS   Procedures:NONE Antimicrobials:  S/P MAXIPIME Subjective:NO NEW COMPLAINTS   Objective: Vitals:   04/07/17 0801 04/07/17 0901 04/07/17 1219 04/07/17 1506  BP: (!) 128/49 (!) 128/50 125/65 (!) 101/45  Pulse:  71 63 68  Resp:   18 17  Temp:   97.6 F (36.4 C)   TempSrc:   Oral   SpO2:   99% 95%  Weight:      Height:        Intake/Output Summary (Last 24 hours) at 04/07/2017 1605 Last data filed at 04/07/2017 0044 Gross per 24 hour  Intake -  Output 550 ml  Net -550 ml   Filed Weights   04/05/17 0650 04/06/17 0445 04/07/17 0545  Weight: 73.1 kg (161 lb 3.2 oz) 73.4 kg (161 lb 12.8 oz) 76.5 kg (168 lb 10.4 oz)    Examination:  General exam: Appears calm and comfortable  Respiratory system: Clear to auscultation. Respiratory effort normal. Cardiovascular system: S1 & S2 heard, RRR. No JVD, murmurs, rubs, gallops or clicks. No pedal edema. Gastrointestinal system: Abdomen is nondistended, soft and nontender. No organomegaly or masses felt. Normal bowel sounds heard. Central nervous system: Alert and oriented. No focal neurological deficits. Extremities: Symmetric 5 x 5 power. Skin: No rashes, lesions or ulcers Psychiatry: Judgement and insight appear normal. Mood & affect appropriate.     Data Reviewed: I have personally reviewed following labs and imaging studies  CBC: Recent Labs  Lab 04/01/17 0647 04/01/17 1901 04/02/17 0333 04/04/17 0354 04/06/17 0400  WBC 10.6*  --  8.4 8.9 8.3  NEUTROABS  --   --   --  7.8* 7.0  HGB 6.8* 8.2* 7.8* 7.5* 7.9*  HCT 22.3* 26.0* 25.3* 24.2* 25.5*  MCV 86.8  --  87.5 88.6 90.7  PLT 235  --  235 236 222   Basic Metabolic Panel: Recent Labs  Lab 04/03/17 0511  04/04/17 0354 04/05/17 0443 04/06/17 0400 04/07/17 0929  NA 136 136 139 136 138  K 5.4* 5.4* 4.8 4.9 5.1  CL 100* 100* 105 102 106  CO2 27 28 27 28 24   GLUCOSE 179* 207* 150* 205* 169*  BUN 123* 124* 116* 119* 107*  CREATININE 2.89* 2.79* 2.71* 2.63* 2.41*  CALCIUM 8.6* 8.4* 8.3* 8.3* 8.4*   GFR: Estimated Creatinine Clearance: 16.9 mL/min (A) (by C-G formula based on SCr of 2.41 mg/dL (H)). Liver Function Tests: No results for input(s): AST, ALT, ALKPHOS, BILITOT, PROT, ALBUMIN in the last 168 hours. No results for input(s): LIPASE, AMYLASE in the last 168 hours. No results for input(s): AMMONIA in the last 168 hours. Coagulation Profile: Recent Labs  Lab 04/03/17 0511 04/04/17 0354 04/05/17 0443 04/06/17 0400 04/07/17 0339  INR 1.61 1.82 2.38 3.21 2.67   Cardiac Enzymes: No results for input(s): CKTOTAL, CKMB, CKMBINDEX, TROPONINI in the last 168 hours. BNP (last 3 results) No results for input(s): PROBNP in the last 8760 hours. HbA1C: No results for input(s): HGBA1C in the last 72 hours. CBG: Recent Labs  Lab 04/06/17 1140 04/06/17 1704 04/06/17 2102 04/07/17 0741 04/07/17 1242  GLUCAP 216* 210* 171* 107* 73   Lipid Profile: No results for input(s): CHOL, HDL, LDLCALC, TRIG, CHOLHDL, LDLDIRECT in the last 72 hours. Thyroid Function Tests: No results for input(s): TSH, T4TOTAL, FREET4, T3FREE, THYROIDAB in the last 72 hours. Anemia Panel: No results for input(s): VITAMINB12, FOLATE, FERRITIN, TIBC, IRON, RETICCTPCT in the last 72 hours. Sepsis Labs: No results for input(s): PROCALCITON, LATICACIDVEN in the last 168 hours.  Recent Results (from the past 240 hour(s))  Culture, blood (routine x 2) Call MD if unable to obtain prior to antibiotics being given     Status: None   Collection Time: 03/29/17  2:55 PM  Result Value Ref Range Status   Specimen Description BLOOD LEFT ANTECUBITAL  Final   Special Requests IN PEDIATRIC BOTTLE Blood Culture adequate  volume  Final   Culture NO GROWTH 5 DAYS  Final   Report Status 04/03/2017 FINAL  Final  Culture, blood (routine x 2) Call MD if unable to obtain prior to antibiotics being given     Status: None   Collection Time: 03/29/17  3:02 PM  Result Value Ref Range Status   Specimen Description BLOOD LEFT HAND  Final   Special Requests   Final    BOTTLES DRAWN AEROBIC ONLY Blood Culture adequate volume   Culture NO GROWTH 5 DAYS  Final   Report Status 04/03/2017 FINAL  Final  MRSA PCR Screening     Status: None   Collection Time: 03/30/17  7:57 AM  Result Value Ref Range Status   MRSA by PCR NEGATIVE NEGATIVE Final    Comment:        The GeneXpert MRSA Assay (FDA approved for NASAL specimens only), is one component of a comprehensive MRSA colonization surveillance program. It is not intended to diagnose MRSA infection nor to guide or monitor treatment for MRSA infections.          Radiology Studies: No results found.      Scheduled Meds: . atorvastatin  20 mg Oral Daily  . cholecalciferol  1,000  Units Oral q morning - 10a  . escitalopram  10 mg Oral Daily  . feeding supplement (ENSURE ENLIVE)  237 mL Oral BID BM  . fenofibrate  160 mg Oral Daily  . gabapentin  100 mg Oral QHS  . guaiFENesin  600 mg Oral BID  . hydrALAZINE  75 mg Oral BID  . insulin aspart  0-15 Units Subcutaneous TID WC  . insulin aspart  5 Units Subcutaneous TID WC  . insulin detemir  5 Units Subcutaneous Daily  . ipratropium-albuterol  3 mL Nebulization TID  . isosorbide mononitrate  30 mg Oral Daily  . methimazole  5 mg Oral Daily  . metoprolol tartrate  12.5 mg Oral BID  . multivitamin with minerals  1 tablet Oral Daily  . pantoprazole  40 mg Oral Daily  . polyethylene glycol  17 g Oral BID  . predniSONE  30 mg Oral Q breakfast  . senna  1 tablet Oral QHS  . sodium chloride flush  3 mL Intravenous Q12H  . warfarin  4 mg Oral ONCE-1800  . Warfarin - Pharmacist Dosing Inpatient   Does not apply  q1800   Continuous Infusions: . sodium chloride Stopped (03/29/17 0150)     LOS: 18 days      Alwyn Ren, MD [facility] If 7PM-7AM, please contact night-coverage [email or web address] Password [email or web address] 04/07/2017, 4:05 PM

## 2017-04-08 DIAGNOSIS — Z66 Do not resuscitate: Secondary | ICD-10-CM

## 2017-04-08 DIAGNOSIS — R531 Weakness: Secondary | ICD-10-CM

## 2017-04-08 DIAGNOSIS — Z515 Encounter for palliative care: Secondary | ICD-10-CM

## 2017-04-08 LAB — BASIC METABOLIC PANEL
Anion gap: 8 (ref 5–15)
BUN: 102 mg/dL — AB (ref 6–20)
CO2: 27 mmol/L (ref 22–32)
CREATININE: 2.4 mg/dL — AB (ref 0.44–1.00)
Calcium: 8.4 mg/dL — ABNORMAL LOW (ref 8.9–10.3)
Chloride: 104 mmol/L (ref 101–111)
GFR calc Af Amer: 20 mL/min — ABNORMAL LOW (ref >60)
GFR calc non Af Amer: 18 mL/min — ABNORMAL LOW (ref >60)
GLUCOSE: 75 mg/dL (ref 65–99)
Potassium: 4.7 mmol/L (ref 3.5–5.1)
Sodium: 139 mmol/L (ref 135–145)

## 2017-04-08 LAB — GLUCOSE, CAPILLARY
GLUCOSE-CAPILLARY: 73 mg/dL (ref 65–99)
Glucose-Capillary: 71 mg/dL (ref 65–99)

## 2017-04-08 LAB — PROTIME-INR
INR: 2.86
PROTHROMBIN TIME: 29.7 s — AB (ref 11.4–15.2)

## 2017-04-08 MED ORDER — INSULIN ASPART 100 UNIT/ML ~~LOC~~ SOLN: 5.0000 [IU] | Freq: Three times a day (TID) | SUBCUTANEOUS | 11 refills | Status: AC

## 2017-04-08 MED ORDER — TORSEMIDE 20 MG PO TABS: 10.0000 mg | ORAL_TABLET | Freq: Every day | ORAL | 3 refills | Status: AC

## 2017-04-08 MED ORDER — POTASSIUM CHLORIDE CRYS ER 20 MEQ PO TBCR: 10.0000 meq | EXTENDED_RELEASE_TABLET | Freq: Every day | ORAL | 5 refills | Status: AC

## 2017-04-08 MED ORDER — WARFARIN SODIUM 2.5 MG PO TABS: 2.5000 mg | ORAL_TABLET | Freq: Once | ORAL | Status: DC

## 2017-04-08 MED ORDER — GUAIFENESIN ER 600 MG PO TB12: 600.0000 mg | ORAL_TABLET | Freq: Two times a day (BID) | ORAL | 0 refills | Status: AC

## 2017-04-08 MED ORDER — INSULIN DETEMIR 100 UNIT/ML ~~LOC~~ SOLN: 5.0000 [IU] | Freq: Every day | SUBCUTANEOUS | 11 refills | Status: AC

## 2017-04-08 MED ORDER — IPRATROPIUM-ALBUTEROL 0.5-2.5 (3) MG/3ML IN SOLN: 3.0000 mL | Freq: Three times a day (TID) | RESPIRATORY_TRACT | 0 refills | Status: AC

## 2017-04-08 MED ORDER — ENSURE ENLIVE PO LIQD
237.0000 mL | Freq: Two times a day (BID) | ORAL | 12 refills | Status: AC
Start: 2017-04-08 — End: ?

## 2017-04-08 NOTE — Clinical Social Work Placement (Signed)
   CLINICAL SOCIAL WORK PLACEMENT  NOTE  Date:  04/08/2017  Patient Details  Name: Alexis [name] MRN: [id number] Date of Birth: [birth year]  Clinical Social Work is seeking post-discharge placement for this patient at the Skilled  Nursing Facility level of care (*CSW will initial, date and re-position this form in  chart as items are completed):  Yes   Patient/family provided with [place] Clinical Social Work Department's list of facilities offering this level of care within the geographic area requested by the patient (or if unable, by the patient's family).  Yes   Patient/family informed of their freedom to choose among providers that offer the needed level of care, that participate in Medicare, Medicaid or managed care program needed by the patient, have an available bed and are willing to accept the patient.  Yes   Patient/family informed of [facility]'s ownership interest in [facility] and [facility], as well as of the fact that they are under no obligation to receive care at these facilities.  PASRR submitted to EDS on       PASRR number received on       Existing PASRR number confirmed on 04/08/17     FL2 transmitted to all facilities in geographic area requested by pt/family on 04/08/17     FL2 transmitted to all facilities within larger geographic area on       Patient informed that his/her managed care company has contracts with or will negotiate with certain facilities, including the following:  [facility]     Yes   Patient/family informed of bed offers received.  Patient chooses bed at [facility]     Physician recommends and patient chooses bed at      Patient to be transferred to [facility] on 04/08/17.  Patient to be transferred to facility by [facility]     Patient family notified on 04/08/17 of transfer.  Name of family member notified:  Beverely Low, daughter     PHYSICIAN Please prepare  priority discharge summary, including medications, Please prepare prescriptions, Please sign FL2, Please sign DNR     Additional Comment:    _______________________________________________ Abigail Butts, LCSW 04/08/2017, 11:25 AM

## 2017-04-08 NOTE — Progress Notes (Signed)
ANTICOAGULATION CONSULT NOTE   Pharmacy Consult for Warfarin Indication: atrial fibrillation  Labs: Recent Labs    04/06/17 0400 04/07/17 0339 04/07/17 0929 04/08/17 0544  HGB 7.9*  --   --   --   HCT 25.5*  --   --   --   PLT 222  --   --   --   LABPROT 32.5* 28.2*  --  29.7*  INR 3.21 2.67  --  2.86  CREATININE 2.63*  --  2.41* 2.40*    Estimated Creatinine Clearance: 17.2 mL/min (A) (by C-G formula based on SCr of 2.4 mg/dL (H)).  Assessment: 21 YOF on warfarin PTA for AFib - Home dose (as per [facility] note on 10/12) was 4mg  daily except 6mg  on Mondays - dose was reduced to that regimen during that appointment d/t INR of 3.6 in the clinic. CHADSVASc score 6  INR is 2.86 today - within goal S/P holding x 1 on 11/11  Drug interactions include fenofibrate and methimazole   Goal of Therapy:  INR 2-3 Monitor platelets by anticoagulation protocol: Yes   Plan:  Warfarin 2.5 mg x 1 Daily INR  Isaac Bliss, PharmD, BCPS, BCCCP Clinical Pharmacist Clinical phone for 04/08/2017 from 7a-3:30p: [phone] If after 3:30p, please call main pharmacy at: [phone] 04/08/2017 9:48 AM

## 2017-04-08 NOTE — Discharge Summary (Signed)
Physician Discharge Summary  Alexis [name] GTX:[id number] DOB: 1932/03/09 DOA: 03/20/2017  PCP: [name], PA-C  Admit date: 03/20/2017 Discharge date: 04/08/2017  Admitted FromHOME :Disposition: SNF  Recommendations for Outpatient Follow-up:  1. Follow up with PCP in 1-2 weeks 2. Please obtain BMP/CBC in one week 3. PALLIATIVE CARE TO FOLLOW IN THE NURSING HOME/REHAB  Home Health NONE Equipment/Devices: NONE  Discharge ConditionSTABLE CODE STATU SDNR Diet recommendation: Cardiac diet Brief/Interim Summary:83 year old woman PMH diastolic heart failure, oxygen dependent COPD presented with increasing shortness of breath, orthopnea and worsening hypoxia. Admitted for acute on chronic diastolic congestive heart failure, acute on chronic hypoxic respiratory failure. Gradually improved with diuresis but developed AKI prolonging hospitalization. Renal function peaked but then developed acute COPD exacerbation, possible pneumoniaand recurrent CHF necessitating BiPAP and transfer to SDU. Condition now improving, COPD stabilizing and renal function appears stable     Discharge Diagnoses:  Principal Problem:   COPD with acute exacerbation (HCC) Active Problems:   COPD (chronic obstructive pulmonary disease) (HCC)   Paroxysmal atrial fibrillation (HCC)   Long term current use of anticoagulant therapy   Chronic respiratory failure with hypoxia (HCC)   Type II diabetes mellitus (HCC)   Weakness generalized   AKI (acute kidney injury) (HCC)   CKD (chronic kidney disease), stage IV (HCC)   Acute on chronic diastolic CHF (congestive heart failure) (HCC)   Severe pulmonary arterial systolic hypertension (HCC)   Acute on chronic respiratory failure with hypoxia (HCC)   DNR (do not resuscitate)   Palliative care by specialist  Acute on chronic hypoxic respiratory failure due to pneumonia/copd/chf-patient received steroids IV.  She also received Maxipime full treatment full course  and was DC'd she was also treated with torsemide which made her renal functions worse.  So the torsemide was on hold for 4-5days.  But patient remained stable.torsemide is not restarted at this time restart torsemide as needed after she gets back to the nursing home.  aki on ckd-renal functions worsened with diuretics.follow levels.We will again hold diuretics today. Patient seen by nephrology patient not a candidate for dialysis nor does she need dialysis at this time. Type 2 dm add insulin before meals.  afib continue coumadin.  Anemia of chronic disease guaiac stools positive.  She was seen by GI who did not think that she needs GI evaluation at this time nor can patient withstand a GI evaluation at this time.    Discharge Instructions patient to have palliative care follow-up at the rehab/nursing home.   Allergies as of 04/08/2017      Reactions   Yellow Dyes (non-tartrazine) Nausea And Vomiting   "deathly allergic" No other information given to explain reaction. Per Daughter- this was an IV dye. Ok with yellow coloring on Coumadin tablets.    Codeine Hives   Other Other (See Comments)   novocaine broke mouth out   Penicillins Hives   Procaine Hives      Medication List    TAKE these medications   albuterol 108 (90 Base) MCG/ACT inhaler Commonly known as:  PROVENTIL HFA;VENTOLIN HFA Inhale 2 puffs into the lungs every 6 (six) hours as needed for wheezing or shortness of breath.   atorvastatin 20 MG tablet Commonly known as:  LIPITOR Take 20 mg by mouth daily.   cholecalciferol 1000 units tablet Commonly known as:  VITAMIN D Take 1,000 Units by mouth every morning.   docusate sodium 100 MG capsule Commonly known as:  COLACE Take 100 mg by mouth 2 (two)  times daily.   escitalopram 10 MG tablet Commonly known as:  LEXAPRO Take 10 mg by mouth daily.   feeding supplement (ENSURE ENLIVE) Liqd Take 237 mLs 2 (two) times daily between meals by mouth.   fenofibrate  160 MG tablet Take 160 mg by mouth daily.   gabapentin 100 MG capsule Commonly known as:  NEURONTIN Take 1 capsule (100 mg total) by mouth at bedtime.   guaiFENesin 600 MG 12 hr tablet Commonly known as:  MUCINEX Take 1 tablet (600 mg total) 2 (two) times daily by mouth.   hydrALAZINE 50 MG tablet Commonly known as:  APRESOLINE Take 1.5 tablets (75 mg total) by mouth 2 (two) times daily.   insulin aspart 100 UNIT/ML injection Commonly known as:  novoLOG Inject 5 Units 3 (three) times daily with meals into the skin.   insulin detemir 100 UNIT/ML injection Commonly known as:  LEVEMIR Inject 0.05 mLs (5 Units total) daily into the skin. Start taking on:  04/09/2017   ipratropium-albuterol 0.5-2.5 (3) MG/3ML Soln Commonly known as:  DUONEB Take 3 mLs 3 (three) times daily by nebulization.   isosorbide mononitrate 30 MG 24 hr tablet Commonly known as:  IMDUR TAKE 1 TABLET BY MOUTH EVERY DAY   methimazole 5 MG tablet Commonly known as:  TAPAZOLE Take 5 mg by mouth daily.   metoprolol tartrate 25 MG tablet Commonly known as:  LOPRESSOR Take 0.5 tablets (12.5 mg total) by mouth 2 (two) times daily.   OXYGEN Inhale 3 L into the lungs at bedtime.   polyethylene glycol packet Commonly known as:  MIRALAX / GLYCOLAX Take 17 g by mouth daily as needed for mild constipation.   potassium chloride SA 20 MEQ tablet Commonly known as:  KLOR-CON M20 Take 0.5 tablets (10 mEq total) daily by mouth. What changed:  how much to take   torsemide 20 MG tablet Commonly known as:  DEMADEX Take 0.5 tablets (10 mg total) daily by mouth. What changed:  how much to take   warfarin 4 MG tablet Commonly known as:  COUMADIN Take as directed. If you are unsure how to take this medication, talk to your nurse or doctor. Original instructions:  TAKE 1 TO 1&1/2 TABLETS BY MOUTH AS DIRECTED BY COUMADIN CLINIC What changed:  See the new instructions.       Contact information for follow-up  providers    [facility].   Specialty:  Home Health Services Why:  They will do your home health care at your home Contact information: [address] [place] [address] [phone]        [facility].   Specialty:  [facility] Contact information: [address] [place] [address] [phone]        Chrystie Nose, MD Follow up on 04/14/2017.   Specialty:  Cardiology Why:  Please arrive at 10:45 AM for an 11 AM appointment Contact information: [address] [place] [address] [place] [address] [phone]            Contact information for after-discharge care    Destination    [facility] .   Service:  Skilled Nursing Contact information: [address] [place] [address] [phone]                 Allergies  Allergen Reactions  . Yellow Dyes (Non-Tartrazine) Nausea And Vomiting    "deathly allergic" No other information given  to explain reaction. Per Daughter- this was an IV dye. Ok with yellow coloring on Coumadin tablets.   . Codeine Hives  . Other Other (See Comments)    novocaine broke mouth out  . Penicillins Hives  . Procaine Hives    Consultations:  GI, nephrology, cardiology   Procedures/Studies: Dg Chest 2 View  Result Date: 03/20/2017 CLINICAL DATA:  Shortness of breath. EXAM: CHEST  2 VIEW COMPARISON:  03/01/2016 FINDINGS: The cardiac silhouette remains enlarged. There are patchy, relatively symmetric airspace opacities throughout both lungs. Tiny bilateral pleural effusions are present. No pneumothorax is identified. No acute osseous abnormality is seen. Old left rib fractures are again noted. IMPRESSION: Diffuse bilateral airspace disease which may reflect pneumonia or edema. Electronically Signed   By: Sebastian Ache M.D.   On: 03/20/2017 12:02   US Renal  Result Date: 03/28/2017 CLINICAL  DATA:  Acute renal failure. EXAM: RENAL / URINARY TRACT ULTRASOUND COMPLETE COMPARISON:  CT 09/21/2015 . FINDINGS: Right Kidney: Length: 10.4 cm. Mild increased echogenicity. Cortical thinning and irregularity. 8 mm nonobstructing calyceal stone. No mass or hydronephrosis visualized. Left Kidney: Length: 11.3 cm. Mild increased echogenicity. Cortical thinning and irregularity. No mass or hydronephrosis visualized. Bladder: Appears normal for degree of bladder distention. Incidental note made of gallstones. IMPRESSION: 1. Mild increased echogenicity both kidneys. Bilateral renal cortical thinning and irregularity. Findings consistent with chronic medical renal disease, cortical atrophy and scarring. 2. Nonobstructing 8 mm right renal stone. 3. No acute abnormalities. No evidence of hydronephrosis or bladder distention. 4. Incidental note made of gallstones . Electronically Signed   By: Maisie Fus  Register   On: 03/28/2017 07:23   Dg Chest Port 1 View  Result Date: 04/02/2017 CLINICAL DATA:  Lower extremity soft tissue edema EXAM: PORTABLE CHEST 1 VIEW COMPARISON:  March 29, 2017 FINDINGS: There are bilateral pleural effusions. There is interstitial and alveolar edema bilaterally. There is airspace consolidation in the left lower lobe. There is cardiomegaly with pulmonary venous hypertension. There is aortic atherosclerosis. No evident adenopathy. There are old healed rib fractures on the left. IMPRESSION: Findings which are felt to be radiographically indicative of congestive heart failure. There is questionable superimposed pneumonia left lower lobe. Cardiomegaly is stable. There is aortic atherosclerosis. Old healed rib fractures on left. Aortic Atherosclerosis (ICD10-I70.0). Electronically Signed   By: Bretta Bang III M.D.   On: 04/02/2017 16:30   Dg Chest Port 1 View  Result Date: 03/29/2017 CLINICAL DATA:  Pt has SOB since last night, SOB went away for a couple hours then came back this AM EXAM:  PORTABLE CHEST 1 VIEW COMPARISON:  03/20/2017 FINDINGS: Cardiac silhouette is enlarged. No mediastinal or hilar masses. There is vascular congestion and patchy areas of airspace opacity. Additional opacity noted at both lung bases partly obscures hemidiaphragms likely due to small effusions. No pneumothorax. Skeletal structures are demineralized. There old left-sided rib fractures. IMPRESSION: 1. Findings are similar to the most recent prior exam with bilateral patchy airspace lung opacities. Name opacity at the lung bases is likely due to the development of small pleural effusions. Findings are consistent with either asymmetric pulmonary edema or multifocal pneumonia. Electronically Signed   By: Amie Portland M.D.   On: 03/29/2017 13:01   Dg Swallowing Func-speech Pathology  Result Date: 04/04/2017 Objective Swallowing Evaluation: Type of Study: MBS-Modified Barium Swallow Study  Patient Details Name: Alexis [name] MRN: [id number] Date of Birth: 26-Dec-1931 Today's Date: 04/04/2017 Time: SLP Start Time (ACUTE ONLY): 4098 -SLP Stop Time (ACUTE  ONLY): [id number] SLP Time Calculation (min) (ACUTE ONLY): 15 min Past Medical History: Past Medical History: Diagnosis Date . Anxiety  . Chronic diastolic CHF (congestive heart failure) (HCC)   a. 05/2015 Echo: EF 55-60%, mild LVH, no rwma, mild MR, mildly dil RA, mod TR, PASP . . CKD (chronic kidney disease), stage IV (HCC)  . Confusion state  . COPD (chronic obstructive pulmonary disease) (HCC)   a. on home O2 @ 3lpm. . Elevated troponin   a. H/o elevated trop in setting of CHF; b. 06/2013 Lexiscan MV: nl EF, mild anterior breast attenuation, no ischemia.  . Family history of adverse reaction to anesthesia  . GIB (gastrointestinal bleeding)   a. History of GIB. Marland Kitchen Hypertensive heart disease  . Macular degeneration, bilateral  . Myocardial infarction (HCC)  . Neuropathy  . Paroxysmal atrial fibrillation (HCC)   a. CHA2DS2VASc = 6-->coumadin. Marland Kitchen PONV (postoperative nausea  and vomiting)  . Type II diabetes mellitus (HCC)  . Varicose veins  Past Surgical History: Past Surgical History: Procedure Laterality Date . ABDOMINAL HYSTERECTOMY   . FRACTURE SURGERY   . HIP FRACTURE SURGERY  2004 . VEIN SURGERY   HPI: [name] a34 y.o.female,with past medical history relevant for diastolic dysfunction CHF, COPD on 2-3 L of oxygen at home, DM, MI admitted with a 2 to three-day history of worsening shortness of breath, orthopnea and increased oxygen requirement.CXR 11/3 findings are similar to the most recent prior exam with bilateral patchy airspace lung opacities. Name opacity at the lung bases is likely due to the development of small pleural effusions. Findings are consistent with either asymmetric pulmonary edema or multifocal pneumonia. MBS 02/28/16 functional swallow, slight oral holding; barium pill hesitated mid esophagus and transited iwth additional puree. Reg/thin recommended. Given family reports of frequent coughing with/after meals and COPD, proceed with MBS.  No Data Recorded Assessment / Plan / Recommendation CHL IP CLINICAL IMPRESSIONS 04/04/2017 Clinical Impression MBS results same per documentation as prior study 02/2016. No significant impairments with oral control, manipulation or transit. Pharyngeal phase intact with timely initiation of protective mechanisms, hyolaryngeal excursion and elevation and epiglottic deflection. No pharyngeal residue. Pill observed to stop in mid esophagus with sudden retrograde movment of barium close to pharynx requiring puree to assist in transit through GE junction (thin barium ineffective). Pt prefers to remain on Dys 2 texture; recommend continue thin liquids and bite puree following pills to ensure transit through esophagus. Esophageal precautions (remain upright after meals 45 min to 1 hour, smaller meals more frequently, elevate head at night). No further ST needed.   SLP Visit Diagnosis Dysphagia, unspecified (R13.10)  Attention and concentration deficit following -- Frontal lobe and executive function deficit following -- Impact on safety and function (No Data)   CHL IP TREATMENT RECOMMENDATION 04/04/2017 Treatment Recommendations No treatment recommended at this time   Prognosis 03/31/2017 Prognosis for Safe Diet Advancement Good Barriers to Reach Goals -- Barriers/Prognosis Comment -- CHL IP DIET RECOMMENDATION 04/04/2017 SLP Diet Recommendations Dysphagia 2 (Fine chop) solids;Thin liquid Liquid Administration via Cup;Straw Medication Administration Whole meds with liquid Compensations Slow rate;Small sips/bites Postural Changes Seated upright at 90 degrees;Remain semi-upright after after feeds/meals (Comment)   CHL IP OTHER RECOMMENDATIONS 04/04/2017 Recommended Consults -- Oral Care Recommendations Oral care BID Other Recommendations --   CHL IP FOLLOW UP RECOMMENDATIONS 04/04/2017 Follow up Recommendations None   CHL IP FREQUENCY AND DURATION 03/31/2017 Speech Therapy Frequency (ACUTE ONLY) min 2x/week Treatment Duration 2 weeks      CHL IP  ORAL PHASE 04/04/2017 Oral Phase WFL Oral - Pudding Teaspoon -- Oral - Pudding Cup -- Oral - Honey Teaspoon -- Oral - Honey Cup -- Oral - Nectar Teaspoon -- Oral - Nectar Cup -- Oral - Nectar Straw -- Oral - Thin Teaspoon -- Oral - Thin Cup -- Oral - Thin Straw -- Oral - Puree -- Oral - Mech Soft -- Oral - Regular -- Oral - Multi-Consistency -- Oral - Pill -- Oral Phase - Comment --  CHL IP PHARYNGEAL PHASE 04/04/2017 Pharyngeal Phase WFL Pharyngeal- Pudding Teaspoon -- Pharyngeal -- Pharyngeal- Pudding Cup -- Pharyngeal -- Pharyngeal- Honey Teaspoon -- Pharyngeal -- Pharyngeal- Honey Cup -- Pharyngeal -- Pharyngeal- Nectar Teaspoon -- Pharyngeal -- Pharyngeal- Nectar Cup -- Pharyngeal -- Pharyngeal- Nectar Straw -- Pharyngeal -- Pharyngeal- Thin Teaspoon -- Pharyngeal -- Pharyngeal- Thin Cup -- Pharyngeal -- Pharyngeal- Thin Straw -- Pharyngeal -- Pharyngeal- Puree -- Pharyngeal -- Pharyngeal-  Mechanical Soft -- Pharyngeal -- Pharyngeal- Regular -- Pharyngeal -- Pharyngeal- Multi-consistency -- Pharyngeal -- Pharyngeal- Pill -- Pharyngeal -- Pharyngeal Comment --  CHL IP CERVICAL ESOPHAGEAL PHASE 04/04/2017 Cervical Esophageal Phase WFL Pudding Teaspoon -- Pudding Cup -- Honey Teaspoon -- Honey Cup -- Nectar Teaspoon -- Nectar Cup -- Nectar Straw -- Thin Teaspoon -- Thin Cup -- Thin Straw -- Puree -- Mechanical Soft -- Regular -- Multi-consistency -- Pill -- Cervical Esophageal Comment -- No flowsheet data found. Royce Macadamia 04/04/2017, 10:38 AM Breck Coons Lonell Face.Ed CCC-SLP Pager [phone]               (Echo, Carotid, EGD, Colonoscopy, ERCP)    Subjective:   Discharge Exam: Vitals:   04/08/17 0906 04/08/17 1122  BP:  (!) 133/54  Pulse:  66  Resp:  (!) 25  Temp:  98 F (36.7 C)  SpO2: 94% 95%   Vitals:   04/08/17 0505 04/08/17 0731 04/08/17 0906 04/08/17 1122  BP: 132/66 (!) 143/55  (!) 133/54  Pulse: (!) 55 67  66  Resp: 13 14  (!) 25  Temp: 98.1 F (36.7 C)   98 F (36.7 C)  TempSrc: Oral Oral  Oral  SpO2: 97% 93% 94% 95%  Weight: 77.9 kg (171 lb 11.8 oz)     Height:        General: Pt is alert, awake, not in acute distress Cardiovascular: RRR, S1/S2 +, no rubs, no gallops Respiratory: CTA bilaterally, no wheezing, no rhonchi Abdominal: Soft, NT, ND, bowel sounds + Extremities: no edema, no cyanosis    The results of significant diagnostics from this hospitalization (including imaging, microbiology, ancillary and laboratory) are listed below for reference.     Microbiology: Recent Results (from the past 240 hour(s))  Culture, blood (routine x 2) Call MD if unable to obtain prior to antibiotics being given     Status: None   Collection Time: 03/29/17  2:55 PM  Result Value Ref Range Status   Specimen Description BLOOD LEFT ANTECUBITAL  Final   Special Requests IN PEDIATRIC BOTTLE Blood Culture adequate volume  Final   Culture NO GROWTH 5 DAYS   Final   Report Status 04/03/2017 FINAL  Final  Culture, blood (routine x 2) Call MD if unable to obtain prior to antibiotics being given     Status: None   Collection Time: 03/29/17  3:02 PM  Result Value Ref Range Status   Specimen Description BLOOD LEFT HAND  Final   Special Requests   Final    BOTTLES DRAWN AEROBIC ONLY Blood  Culture adequate volume   Culture NO GROWTH 5 DAYS  Final   Report Status 04/03/2017 FINAL  Final  MRSA PCR Screening     Status: None   Collection Time: 03/30/17  7:57 AM  Result Value Ref Range Status   MRSA by PCR NEGATIVE NEGATIVE Final    Comment:        The GeneXpert MRSA Assay (FDA approved for NASAL specimens only), is one component of a comprehensive MRSA colonization surveillance program. It is not intended to diagnose MRSA infection nor to guide or monitor treatment for MRSA infections.      Labs: BNP (last 3 results) Recent Labs    03/20/17 1130  BNP 710.5*   Basic Metabolic Panel: Recent Labs  Lab 04/04/17 0354 04/05/17 0443 04/06/17 0400 04/07/17 0929 04/08/17 0544  NA 136 139 136 138 139  K 5.4* 4.8 4.9 5.1 4.7  CL 100* 105 102 106 104  CO2 28 27 28 24 27   GLUCOSE 207* 150* 205* 169* 75  BUN 124* 116* 119* 107* 102*  CREATININE 2.79* 2.71* 2.63* 2.41* 2.40*  CALCIUM 8.4* 8.3* 8.3* 8.4* 8.4*   Liver Function Tests: No results for input(s): AST, ALT, ALKPHOS, BILITOT, PROT, ALBUMIN in the last 168 hours. No results for input(s): LIPASE, AMYLASE in the last 168 hours. No results for input(s): AMMONIA in the last 168 hours. CBC: Recent Labs  Lab 04/01/17 1901 04/02/17 0333 04/04/17 0354 04/06/17 0400  WBC  --  8.4 8.9 8.3  NEUTROABS  --   --  7.8* 7.0  HGB 8.2* 7.8* 7.5* 7.9*  HCT 26.0* 25.3* 24.2* 25.5*  MCV  --  87.5 88.6 90.7  PLT  --  235 236 222   Cardiac Enzymes: No results for input(s): CKTOTAL, CKMB, CKMBINDEX, TROPONINI in the last 168 hours. BNP: Invalid input(s): POCBNP CBG: Recent Labs  Lab  04/07/17 1242 04/07/17 1634 04/07/17 2143 04/08/17 0730 04/08/17 1121  GLUCAP 73 120* 141* 73 71   D-Dimer No results for input(s): DDIMER in the last 72 hours. Hgb A1c No results for input(s): HGBA1C in the last 72 hours. Lipid Profile No results for input(s): CHOL, HDL, LDLCALC, TRIG, CHOLHDL, LDLDIRECT in the last 72 hours. Thyroid function studies No results for input(s): TSH, T4TOTAL, T3FREE, THYROIDAB in the last 72 hours.  Invalid input(s): FREET3 Anemia work up No results for input(s): VITAMINB12, FOLATE, FERRITIN, TIBC, IRON, RETICCTPCT in the last 72 hours. Urinalysis    Component Value Date/Time   COLORURINE YELLOW 03/27/2017 1031   APPEARANCEUR CLOUDY (A) 03/27/2017 1031   LABSPEC 1.009 03/27/2017 1031   PHURINE 5.0 03/27/2017 1031   GLUCOSEU NEGATIVE 03/27/2017 1031   HGBUR NEGATIVE 03/27/2017 1031   BILIRUBINUR NEGATIVE 03/27/2017 1031   KETONESUR NEGATIVE 03/27/2017 1031   PROTEINUR 100 (A) 03/27/2017 1031   UROBILINOGEN 0.2 12/14/2014 1821   NITRITE NEGATIVE 03/27/2017 1031   LEUKOCYTESUR LARGE (A) 03/27/2017 1031   Sepsis Labs Invalid input(s): PROCALCITONIN,  WBC,  LACTICIDVEN Microbiology Recent Results (from the past 240 hour(s))  Culture, blood (routine x 2) Call MD if unable to obtain prior to antibiotics being given     Status: None   Collection Time: 03/29/17  2:55 PM  Result Value Ref Range Status   Specimen Description BLOOD LEFT ANTECUBITAL  Final   Special Requests IN PEDIATRIC BOTTLE Blood Culture adequate volume  Final   Culture NO GROWTH 5 DAYS  Final   Report Status 04/03/2017 FINAL  Final  Culture, blood (routine  x 2) Call MD if unable to obtain prior to antibiotics being given     Status: None   Collection Time: 03/29/17  3:02 PM  Result Value Ref Range Status   Specimen Description BLOOD LEFT HAND  Final   Special Requests   Final    BOTTLES DRAWN AEROBIC ONLY Blood Culture adequate volume   Culture NO GROWTH 5 DAYS  Final    Report Status 04/03/2017 FINAL  Final  MRSA PCR Screening     Status: None   Collection Time: 03/30/17  7:57 AM  Result Value Ref Range Status   MRSA by PCR NEGATIVE NEGATIVE Final    Comment:        The GeneXpert MRSA Assay (FDA approved for NASAL specimens only), is one component of a comprehensive MRSA colonization surveillance program. It is not intended to diagnose MRSA infection nor to guide or monitor treatment for MRSA infections.      Time coordinating discharge: Over 30 minutes  SIGNED:   Alwyn RenElizabeth G Chari Parmenter, MD  [facility] 04/08/2017, 11:29 AM Pager   If 7PM-7AM, please contact night-coverage [email or web address] Password [email or web address]

## 2017-04-08 NOTE — Care Management Important Message (Signed)
Important Message  Patient Details  Name: Alexis [name] MRN: [id number] Date of Birth: Dec 03, 1931   Medicare Important Message Given:  Yes    Talayla Doyel Abena 04/08/2017, 12:03 PM

## 2017-04-08 NOTE — Progress Notes (Signed)
Patient's family to complete intake paperwork at [facility] at 2 pm today. Family will then take patient by car to facility. Family reports they have a portable oxygen tank they will bring for patient to use during transit.   Patient will discharge to [facility]. Anticipated discharge date: 04/08/17 Family notified: Neysa Bonito, granddaughter/POA; Lattie Corns, daughter Transportation by: Daughter, Lattie Corns will drive patient to facility.  Nurse to call report to [phone].   CSW signing off.  Abigail Butts, LCSWA  Clinical Social Worker

## 2017-04-08 NOTE — Consult Note (Signed)
Consultation Note Date: 04/08/2017   Patient Name: Alexis [name]  DOB: [birth year]  MRN: [id number]  Age / Sex: 83 y.o., female  PCP: [name], PA-C Referring Physician: Alwyn Ren, MD  Reason for Consultation: Establishing goals of care and Psychosocial/spiritual support  HPI/Patient Profile: 83 y.o. female  admitted on 03/20/2017 with  PMH significant for  diastolic heart failure, oxygen dependent COPD presented with increasing shortness of breath, orthopnea and worsening hypoxia.  Admitted for acute on chronic diastolic congestive heart failure, acute on chronic hypoxic respiratory failure.  Gradually improved with diuresis but developed AKI prolonging hospitalization. Renal function peaked but then developed acute COPD exacerbation, possible pneumoniaand recurrent CHF necessitating BiPAP and transfer to SDU.  Condition now improving, COPD stabilizing and renal function appears stable.  Multiple co-morbid ites, High risk for decompensation; family face advanced directive decisions and anticipatory care needs.   Clinical Assessment and Goals of Care:  This NP Lorinda Creed reviewed medical records, received report from team, assessed the patient and then meet at the patient's bedside along with her son/David and daughter Para March  to discuss diagnosis, prognosis, GOC, EOL wishes disposition and options.  A detailed discussion was had today regarding advanced directives.  Concepts specific to code status, artifical feeding and hydration, continued IV antibiotics and rehospitalization was had.  The difference between a aggressive medical intervention path  and a palliative comfort care path for this patient at this time was had.  Values and goals of care important to patient and family were attempted to be elicited.  MOST form introduced and Hard Choices booklet left for review.  Concept  of Hospice and Palliative Care were discussed  Natural trajectory and expectations at EOL were discussed.  Questions and concerns addressed.   Family encouraged to call with questions or concerns.  PMT will continue to support holistically.   No documented HPOA; all four children work together for best interest of the patient.  Onalee Hua is main contact, patient lives with this son.   SUMMARY OF RECOMMENDATIONS    Code Status/Advance Care Planning:  DNR   Symptom Management:   Weakness: SNF for rehabilitation as tolerated  Palliative Prophylaxis:   Aspiration, Bowel Regimen and Delirium Protocol  Additional Recommendations (Limitations, Scope, Preferences):  Full Scope Treatment  Psycho-social/Spiritual:   Desire for further Chaplaincy support:no  Additional Recommendations: Education on Hospice  Prognosis:   Unable to determine- clearly long term prognosis will depend on desire for life prolonging measures into the future.     Comfort is a priority a stated by family  Discharge Planning:   Skilled Nursing Facility for rehab with Palliative care service follow-up    Family are verbalizing their concern that caring for the patient is becoming too difficult and unsafe.  Depending on patient's ability to rehab they are anticipating long term care need.     Primary Diagnoses: Present on Admission: . (Resolved) (HFpEF) heart failure with preserved ejection fraction (HCC) . Paroxysmal atrial fibrillation (HCC) . (Resolved) UTI (urinary tract infection) .  Chronic respiratory failure with hypoxia (HCC) . CKD (chronic kidney disease), stage IV (HCC) . COPD (chronic obstructive pulmonary disease) (HCC) . AKI (acute kidney injury) (HCC)   I have reviewed the medical record, interviewed the patient and family, and examined the patient. The following aspects are pertinent.  Past Medical History:  Diagnosis Date  . Anxiety   . Chronic diastolic CHF (congestive heart  failure) (HCC)    a. 05/2015 Echo: EF 55-60%, mild LVH, no rwma, mild MR, mildly dil RA, mod TR, PASP .  . CKD (chronic kidney disease), stage IV (HCC)   . Confusion state   . COPD (chronic obstructive pulmonary disease) (HCC)    a. on home O2 @ 3lpm.  . Elevated troponin    a. H/o elevated trop in setting of CHF; b. 06/2013 Lexiscan MV: nl EF, mild anterior breast attenuation, no ischemia.   . Family history of adverse reaction to anesthesia   . GIB (gastrointestinal bleeding)    a. History of GIB.  Marland Kitchen Hypertensive heart disease   . Macular degeneration, bilateral   . Myocardial infarction (HCC)   . Neuropathy   . Paroxysmal atrial fibrillation (HCC)    a. CHA2DS2VASc = 6-->coumadin.  Marland Kitchen PONV (postoperative nausea and vomiting)   . Type II diabetes mellitus (HCC)   . Varicose veins    Social History   Socioeconomic History  . Marital status: Widowed    Spouse name: None  . Number of children: Y  . Years of education: None  . Highest education level: None  Social Needs  . Financial resource strain: None  . Food insecurity - worry: None  . Food insecurity - inability: None  . Transportation needs - medical: None  . Transportation needs - non-medical: None  Occupational History  . Occupation: retired  Tobacco Use  . Smoking status: Passive Smoke Exposure - Never Smoker  . Smokeless tobacco: Never Used  Substance and Sexual Activity  . Alcohol use: No    Alcohol/week: 0.0 oz  . Drug use: No  . Sexual activity: None  Other Topics Concern  . None  Social History Narrative   Originally from [place]. She has always lived in [place] & had no travel. She has significant smoke exposure through her husband for 60 years. She has no pets currently. No prior bird or mold exposure. 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 She lives in [place] with  her son.   Family History  Problem Relation Age of Onset  . Colon cancer Father 44       advanced when discovered, no surgery or therapy.  this led to his death  . Breast cancer Mother   . Hypertension Mother   . Alzheimer's disease Mother   . Breast cancer Sister   . Cancer Brother   . Lung cancer Brother   . Thyroid disease Neg Hx   . Heart attack Neg Hx    Scheduled Meds: . atorvastatin  20 mg Oral Daily  . cholecalciferol  1,000 Units Oral q morning - 10a  . escitalopram  10 mg Oral Daily  . feeding supplement (ENSURE ENLIVE)  237 mL Oral BID BM  . fenofibrate  160 mg Oral Daily  . gabapentin  100 mg Oral QHS  . guaiFENesin  600 mg Oral BID  . hydrALAZINE  75 mg Oral BID  .  insulin aspart  0-15 Units Subcutaneous TID WC  . insulin aspart  5 Units Subcutaneous TID WC  . insulin detemir  5 Units Subcutaneous Daily  . ipratropium-albuterol  3 mL Nebulization TID  . isosorbide mononitrate  30 mg Oral Daily  . methimazole  5 mg Oral Daily  . metoprolol tartrate  12.5 mg Oral BID  . multivitamin with minerals  1 tablet Oral Daily  . pantoprazole  40 mg Oral Daily  . polyethylene glycol  17 g Oral BID  . predniSONE  30 mg Oral Q breakfast  . senna  1 tablet Oral QHS  . sodium chloride flush  3 mL Intravenous Q12H  . Warfarin - Pharmacist Dosing Inpatient   Does not apply q1800   Continuous Infusions: . sodium chloride Stopped (03/29/17 0150)   PRN Meds:.sodium chloride, acetaminophen **OR** acetaminophen, albuterol, ondansetron **OR** ondansetron (ZOFRAN) IV, sodium chloride flush, traZODone Medications Prior to Admission:  Prior to Admission medications   Medication Sig Start Date End Date Taking? Authorizing Provider  albuterol (PROVENTIL HFA;VENTOLIN HFA) 108 (90 BASE) MCG/ACT inhaler Inhale 2 puffs into the lungs every 6 (six) hours as needed for wheezing or shortness of breath. 06/11/13  Yes Dhungel, Nishant, MD  atorvastatin (LIPITOR) 20 MG tablet Take 20 mg by mouth  daily.   Yes [provider]  cholecalciferol (VITAMIN D) 1000 UNITS tablet Take 1,000 Units by mouth every morning.    Yes [provider]  docusate sodium (COLACE) 100 MG capsule Take 100 mg by mouth 2 (two) times daily.    Yes [provider]  escitalopram (LEXAPRO) 10 MG tablet Take 10 mg by mouth daily.   Yes [provider]  fenofibrate 160 MG tablet Take 160 mg by mouth daily.   Yes [provider]  gabapentin (NEURONTIN) 100 MG capsule Take 1 capsule (100 mg total) by mouth at bedtime. 08/05/15  Yes Rodolph Bong, MD  hydrALAZINE (APRESOLINE) 50 MG tablet Take 1.5 tablets (75 mg total) by mouth 2 (two) times daily. 02/15/17  Yes Deneise Lever, MD  methimazole (TAPAZOLE) 5 MG tablet Take 5 mg by mouth daily. 03/18/17  Yes [provider]  metoprolol tartrate (LOPRESSOR) 25 MG tablet Take 0.5 tablets (12.5 mg total) by mouth 2 (two) times daily. 12/30/16  Yes Hilty, Lisette Abu, MD  OXYGEN Inhale 3 L into the lungs at bedtime.    Yes [provider]  polyethylene glycol (MIRALAX / GLYCOLAX) packet Take 17 g by mouth daily as needed for mild constipation.   Yes [provider]  potassium chloride SA (KLOR-CON M20) 20 MEQ tablet Take 1 tablet (20 mEq total) by mouth daily. 05/22/16  Yes Hilty, Lisette Abu, MD  torsemide (DEMADEX) 20 MG tablet Take 2 tablets (40 mg total) by mouth daily. 05/21/16  Yes Hilty, Lisette Abu, MD  warfarin (COUMADIN) 4 MG tablet TAKE 1 TO 1&1/2 TABLETS BY MOUTH AS DIRECTED BY COUMADIN CLINIC Patient taking differently: TAKE 4MG  BY MOUTH DAILY EXCEPT MONDAYS TAKE 6MG  03/13/17  Yes Hilty, Lisette Abu, MD  isosorbide mononitrate (IMDUR) 30 MG 24 hr tablet TAKE 1 TABLET BY MOUTH EVERY DAY 04/07/17   Hilty, Lisette Abu, MD   Allergies  Allergen Reactions  . Yellow Dyes (Non-Tartrazine) Nausea And Vomiting    "deathly allergic" No other information given to explain reaction. Per Daughter- this was an IV dye.  Ok with yellow coloring on Coumadin tablets.   . Codeine Hives  . Other Other (See Comments)  novocaine broke mouth out  . Penicillins Hives  . Procaine Hives   Review of Systems  Constitutional: Positive for appetite change.  Neurological: Positive for weakness.    Physical Exam  Constitutional: She appears well-developed.  Cardiovascular: Normal rate, regular rhythm and normal heart sounds.  Pulmonary/Chest: She has decreased breath sounds in the right lower field and the left lower field.  Abdominal: Soft.  Musculoskeletal:  generalized weakness  Neurological: She is alert.  -oriented to person and place  Skin: Skin is warm and dry.    Vital Signs: BP (!) 143/55 (BP Location: Right Arm)   Pulse 67   Temp 98.1 F (36.7 C) (Oral)   Resp 14   Ht 5\' 2"  (1.575 m)   Wt 77.9 kg (171 lb 11.8 oz)   SpO2 93%   BMI 31.41 kg/m  Pain Assessment: No/denies pain   Pain Score: 0-No pain   SpO2: SpO2: 93 % O2 Device:SpO2: 93 % O2 Flow Rate: .O2 Flow Rate (L/min): 3 L/min  IO: Intake/output summary:   Intake/Output Summary (Last 24 hours) at 04/08/2017 0804 Last data filed at 04/07/2017 2035 Gross per 24 hour  Intake -  Output 400 ml  Net -400 ml    LBM: Last BM Date: 04/05/17 Baseline Weight: Weight: 68 kg (150 lb) Most recent weight: Weight: 77.9 kg (171 lb 11.8 oz)     Palliative Assessment/Data: 40%   Discussed with Dr Ashley RoyaltyMatthews  Time In: 0830 Time Out: 0945 Time Total: 75 min Greater than 50%  of this time was spent counseling and coordinating care related to the above assessment and plan.  Signed by: Lorinda CreedLARACH, Valdis Bevill, NP   Please contact Palliative Medicine Team phone at [phone] for questions and concerns.  For individual provider: See Loretha StaplerAmion

## 2017-04-08 NOTE — [place] FL2 (Signed)
[place] MEDICAID FL2 LEVEL OF CARE SCREENING TOOL     IDENTIFICATION  Patient Name: Alexis GivensChristine E [name] Birthdate: 09/01/1931 Sex: female Admission Date (Current Location): 03/20/2017  [facility] [place]   Facility and Address[facility]  [facility], [address], [place], [place] [address]      Provider Number: [id number]  Attending Physician Name and Address:  Alwyn RenMathews, Elizabeth G, MD  Relative Name and Phone Number:  Cristal FordJeanette Stanley, daughter    Current Level of Care: Hospital Recommended Level of Care: Skilled Nursing Facility Prior Approval Number:    Date Approved/Denied:   PASRR Number: [id number]  Discharge Plan: SNF    Current Diagnoses: Patient Active Problem List   Diagnosis Date Noted  . DNR (do not resuscitate)   . Palliative care by specialist   . Acute on chronic respiratory failure with hypoxia (HCC) 03/31/2017  . COPD with acute exacerbation (HCC) 03/30/2017  . Acute on chronic diastolic CHF (congestive heart failure) (HCC) 03/26/2017  . Severe pulmonary arterial systolic hypertension (HCC) 03/26/2017  . Frequent falls 02/14/2017  . Aortic atherosclerosis (HCC) 02/14/2017  . Peripheral vascular disease (HCC) 02/14/2017  . CKD (chronic kidney disease), stage IV (HCC) 03/04/2016  . Depression with anxiety 02/28/2016  . HLD (hyperlipidemia) 02/28/2016  . Weakness generalized   . AKI (acute kidney injury) (HCC)   . Type II diabetes mellitus (HCC)   . Hypertensive heart disease with heart failure (HCC)   . Atrial fibrillation, persistent (HCC)   . Simple chronic bronchitis (HCC)   . Chronic respiratory failure with hypoxia (HCC) 06/30/2015  . Hyperthyroidism 01/23/2015  . Long term current use of anticoagulant therapy 12/12/2014  . Paroxysmal atrial fibrillation (HCC) 12/01/2014  . Melena   . Normocytic anemia 11/29/2014  . COPD (chronic obstructive pulmonary disease) (HCC) 07/12/2014  . Essential  hypertension 06/10/2013  . Chronic diastolic heart failure, NYHA class 1 (HCC) 06/09/2013    Orientation RESPIRATION BLADDER Height & Weight     Self, Place  O2(nasal cannula 3L) Incontinent, External catheter Weight: 171 lb 11.8 oz (77.9 kg) Height:  5\' 2"  (157.5 cm)  BEHAVIORAL SYMPTOMS/MOOD NEUROLOGICAL BOWEL NUTRITION STATUS      Continent Diet(please see DC summary)  AMBULATORY STATUS COMMUNICATION OF NEEDS Skin   Extensive Assist Verbally PU Stage and Appropriate Care(PU stage II buttocks, foam dressing)                       Personal Care Assistance Level of Assistance  Bathing, Feeding, Dressing Bathing Assistance: Limited assistance Feeding assistance: Independent Dressing Assistance: Limited assistance     Functional Limitations Info  Sight, Hearing, Speech Sight Info: Adequate Hearing Info: Adequate Speech Info: Adequate    SPECIAL CARE FACTORS FREQUENCY  PT (By licensed PT), OT (By licensed OT)     PT Frequency: 5x/week OT Frequency: 5x/week            Contractures Contractures Info: Not present    Additional Factors Info  Code Status, Allergies, Psychotropic, Insulin Sliding Scale Code Status Info: DNR Allergies Info: Yellow Dyes (Non-tartrazine), Codeine, Other, Penicillins, Procaine Psychotropic Info: lexapro Insulin Sliding Scale Info: insulin (novolog) 3x/day with meals, insulin levemir 1x/day       Current Medications (04/08/2017):  This is the current hospital active medication list Current Facility-Administered Medications  Medication Dose Route Frequency Provider Last Rate Last Dose  . 0.9 %  sodium chloride infusion  250 mL Intravenous PRN Emokpae, Courage,  MD   Stopped at 03/29/17 0150  . acetaminophen (TYLENOL) tablet 650 mg  650 mg Oral Q6H PRN Shon Hale, MD   650 mg at 03/29/17 0437   Or  . acetaminophen (TYLENOL) suppository 650 mg  650 mg Rectal Q6H PRN Emokpae, Courage, MD      . albuterol (PROVENTIL) (2.5 MG/3ML)  0.083% nebulizer solution 2.5 mg  2.5 mg Nebulization Q2H PRN Emokpae, Courage, MD   2.5 mg at 03/29/17 1227  . atorvastatin (LIPITOR) tablet 20 mg  20 mg Oral Daily Emokpae, Courage, MD   20 mg at 04/08/17 0843  . cholecalciferol (VITAMIN D) tablet 1,000 Units  1,000 Units Oral q morning - 10a Shon Hale, MD   1,000 Units at 04/08/17 0842  . escitalopram (LEXAPRO) tablet 10 mg  10 mg Oral Daily Mariea Clonts, Courage, MD   10 mg at 04/08/17 0842  . feeding supplement (ENSURE ENLIVE) (ENSURE ENLIVE) liquid 237 mL  237 mL Oral BID BM Emokpae, Courage, MD   237 mL at 04/07/17 1749  . fenofibrate tablet 160 mg  160 mg Oral Daily Emokpae, Courage, MD   160 mg at 04/08/17 0843  . gabapentin (NEURONTIN) capsule 100 mg  100 mg Oral QHS Emokpae, Courage, MD   100 mg at 04/07/17 2208  . guaiFENesin (MUCINEX) 12 hr tablet 600 mg  600 mg Oral BID Mariea Clonts, Courage, MD   600 mg at 04/08/17 0843  . hydrALAZINE (APRESOLINE) tablet 75 mg  75 mg Oral BID Shon Hale, MD   75 mg at 04/08/17 0842  . insulin aspart (novoLOG) injection 0-15 Units  0-15 Units Subcutaneous TID WC Standley Brooking, MD   5 Units at 04/06/17 1722  . insulin aspart (novoLOG) injection 5 Units  5 Units Subcutaneous TID WC Alwyn Ren, MD   5 Units at 04/08/17 [phone]  . insulin detemir (LEVEMIR) injection 5 Units  5 Units Subcutaneous Daily Standley Brooking, MD   5 Units at 04/08/17 [phone]  . ipratropium-albuterol (DUONEB) 0.5-2.5 (3) MG/3ML nebulizer solution 3 mL  3 mL Nebulization TID Standley Brooking, MD   3 mL at 04/08/17 0903  . isosorbide mononitrate (IMDUR) 24 hr tablet 30 mg  30 mg Oral Daily Emokpae, Courage, MD   30 mg at 04/08/17 0843  . methimazole (TAPAZOLE) tablet 5 mg  5 mg Oral Daily Glade Lloyd, MD   5 mg at 04/08/17 0842  . metoprolol tartrate (LOPRESSOR) tablet 12.5 mg  12.5 mg Oral BID Lewayne Bunting, MD   12.5 mg at 04/08/17 0842  . multivitamin with minerals tablet 1 tablet  1 tablet Oral Daily  Shon Hale, MD   1 tablet at 04/08/17 0843  . ondansetron (ZOFRAN) tablet 4 mg  4 mg Oral Q6H PRN Emokpae, Courage, MD       Or  . ondansetron (ZOFRAN) injection 4 mg  4 mg Intravenous Q6H PRN Emokpae, Courage, MD      . pantoprazole (PROTONIX) EC tablet 40 mg  40 mg Oral Daily Alwyn Ren, MD   40 mg at 04/08/17 0842  . polyethylene glycol (MIRALAX / GLYCOLAX) packet 17 g  17 g Oral BID Standley Brooking, MD   17 g at 04/08/17 0848  . predniSONE (DELTASONE) tablet 30 mg  30 mg Oral Q breakfast Alwyn Ren, MD   30 mg at 04/08/17 0843  . senna (SENOKOT) tablet 8.6 mg  1 tablet Oral QHS Standley Brooking, MD   8.6 mg  at 04/07/17 2207  . sodium chloride flush (NS) 0.9 % injection 3 mL  3 mL Intravenous Q12H Shon Hale, MD   3 mL at 04/07/17 2208  . sodium chloride flush (NS) 0.9 % injection 3 mL  3 mL Intravenous PRN Emokpae, Courage, MD      . traZODone (DESYREL) tablet 50 mg  50 mg Oral QHS PRN Mariea Clonts, Courage, MD   50 mg at 03/31/17 2206  . warfarin (COUMADIN) tablet 2.5 mg  2.5 mg Oral ONCE-1800 Bertram Millard, RPH      . Warfarin - Pharmacist Dosing Inpatient   Does not apply q1800 Bajbus, Eliott Nine, [facility]   Stopped at 03/31/17 1800     Discharge Medications: Please see discharge summary for a list of discharge medications.  Relevant Imaging Results:  Relevant Lab Results:   Additional Information SSN: [id number]  Abigail Butts, LCSW

## 2017-04-14 ENCOUNTER — Ambulatory Visit: Payer: Medicare Other | Admitting: Internal Medicine

## 2017-04-16 ENCOUNTER — Other Ambulatory Visit: Payer: Self-pay | Admitting: Internal Medicine

## 2017-04-19 ENCOUNTER — Emergency Department (HOSPITAL_COMMUNITY): Payer: Medicare Other

## 2017-04-19 ENCOUNTER — Encounter (HOSPITAL_COMMUNITY): Payer: Self-pay

## 2017-04-19 ENCOUNTER — Inpatient Hospital Stay (HOSPITAL_COMMUNITY)
Admission: EM | Admit: 2017-04-19 | Discharge: 2017-04-26 | DRG: 377 | Disposition: E | Payer: Medicare Other | Attending: Internal Medicine | Admitting: Internal Medicine

## 2017-04-19 DIAGNOSIS — J9621 Acute and chronic respiratory failure with hypoxia: Secondary | ICD-10-CM | POA: Diagnosis present

## 2017-04-19 DIAGNOSIS — Z9981 Dependence on supplemental oxygen: Secondary | ICD-10-CM | POA: Diagnosis not present

## 2017-04-19 DIAGNOSIS — Z794 Long term (current) use of insulin: Secondary | ICD-10-CM

## 2017-04-19 DIAGNOSIS — D649 Anemia, unspecified: Secondary | ICD-10-CM

## 2017-04-19 DIAGNOSIS — R571 Hypovolemic shock: Secondary | ICD-10-CM | POA: Diagnosis present

## 2017-04-19 DIAGNOSIS — I130 Hypertensive heart and chronic kidney disease with heart failure and stage 1 through stage 4 chronic kidney disease, or unspecified chronic kidney disease: Secondary | ICD-10-CM | POA: Diagnosis present

## 2017-04-19 DIAGNOSIS — F419 Anxiety disorder, unspecified: Secondary | ICD-10-CM | POA: Diagnosis present

## 2017-04-19 DIAGNOSIS — I5033 Acute on chronic diastolic (congestive) heart failure: Secondary | ICD-10-CM | POA: Diagnosis present

## 2017-04-19 DIAGNOSIS — Z801 Family history of malignant neoplasm of trachea, bronchus and lung: Secondary | ICD-10-CM | POA: Diagnosis not present

## 2017-04-19 DIAGNOSIS — Z800 Family history of malignant neoplasm of digestive organs: Secondary | ICD-10-CM | POA: Diagnosis not present

## 2017-04-19 DIAGNOSIS — Z884 Allergy status to anesthetic agent status: Secondary | ICD-10-CM

## 2017-04-19 DIAGNOSIS — T17918A Gastric contents in respiratory tract, part unspecified causing other injury, initial encounter: Secondary | ICD-10-CM | POA: Diagnosis present

## 2017-04-19 DIAGNOSIS — H3530 Unspecified macular degeneration: Secondary | ICD-10-CM | POA: Diagnosis present

## 2017-04-19 DIAGNOSIS — Z820 Family history of epilepsy and other diseases of the nervous system: Secondary | ICD-10-CM | POA: Diagnosis not present

## 2017-04-19 DIAGNOSIS — K920 Hematemesis: Secondary | ICD-10-CM | POA: Diagnosis present

## 2017-04-19 DIAGNOSIS — E119 Type 2 diabetes mellitus without complications: Secondary | ICD-10-CM

## 2017-04-19 DIAGNOSIS — K922 Gastrointestinal hemorrhage, unspecified: Secondary | ICD-10-CM | POA: Diagnosis not present

## 2017-04-19 DIAGNOSIS — Z66 Do not resuscitate: Secondary | ICD-10-CM | POA: Diagnosis present

## 2017-04-19 DIAGNOSIS — Z515 Encounter for palliative care: Secondary | ICD-10-CM | POA: Diagnosis present

## 2017-04-19 DIAGNOSIS — E1122 Type 2 diabetes mellitus with diabetic chronic kidney disease: Secondary | ICD-10-CM | POA: Diagnosis present

## 2017-04-19 DIAGNOSIS — Z90710 Acquired absence of both cervix and uterus: Secondary | ICD-10-CM | POA: Diagnosis not present

## 2017-04-19 DIAGNOSIS — E1140 Type 2 diabetes mellitus with diabetic neuropathy, unspecified: Secondary | ICD-10-CM | POA: Diagnosis present

## 2017-04-19 DIAGNOSIS — J9601 Acute respiratory failure with hypoxia: Secondary | ICD-10-CM

## 2017-04-19 DIAGNOSIS — Z8249 Family history of ischemic heart disease and other diseases of the circulatory system: Secondary | ICD-10-CM

## 2017-04-19 DIAGNOSIS — Z7901 Long term (current) use of anticoagulants: Secondary | ICD-10-CM

## 2017-04-19 DIAGNOSIS — R0602 Shortness of breath: Secondary | ICD-10-CM | POA: Diagnosis present

## 2017-04-19 DIAGNOSIS — D62 Acute posthemorrhagic anemia: Secondary | ICD-10-CM | POA: Diagnosis present

## 2017-04-19 DIAGNOSIS — I481 Persistent atrial fibrillation: Secondary | ICD-10-CM | POA: Diagnosis present

## 2017-04-19 DIAGNOSIS — Z885 Allergy status to narcotic agent status: Secondary | ICD-10-CM

## 2017-04-19 DIAGNOSIS — Z803 Family history of malignant neoplasm of breast: Secondary | ICD-10-CM

## 2017-04-19 DIAGNOSIS — E1151 Type 2 diabetes mellitus with diabetic peripheral angiopathy without gangrene: Secondary | ICD-10-CM | POA: Diagnosis present

## 2017-04-19 DIAGNOSIS — I252 Old myocardial infarction: Secondary | ICD-10-CM | POA: Diagnosis not present

## 2017-04-19 DIAGNOSIS — I480 Paroxysmal atrial fibrillation: Secondary | ICD-10-CM | POA: Diagnosis present

## 2017-04-19 DIAGNOSIS — Z888 Allergy status to other drugs, medicaments and biological substances status: Secondary | ICD-10-CM

## 2017-04-19 DIAGNOSIS — I4819 Other persistent atrial fibrillation: Secondary | ICD-10-CM | POA: Diagnosis present

## 2017-04-19 DIAGNOSIS — J449 Chronic obstructive pulmonary disease, unspecified: Secondary | ICD-10-CM | POA: Diagnosis present

## 2017-04-19 DIAGNOSIS — I110 Hypertensive heart disease with heart failure: Secondary | ICD-10-CM | POA: Diagnosis present

## 2017-04-19 DIAGNOSIS — Z880 Allergy status to penicillin: Secondary | ICD-10-CM

## 2017-04-19 DIAGNOSIS — I700 Atherosclerosis of aorta: Secondary | ICD-10-CM | POA: Diagnosis present

## 2017-04-19 DIAGNOSIS — N184 Chronic kidney disease, stage 4 (severe): Secondary | ICD-10-CM | POA: Diagnosis present

## 2017-04-19 DIAGNOSIS — I2721 Secondary pulmonary arterial hypertension: Secondary | ICD-10-CM | POA: Diagnosis present

## 2017-04-19 LAB — COMPREHENSIVE METABOLIC PANEL
ALT: 952 U/L — AB (ref 14–54)
ANION GAP: 14 (ref 5–15)
AST: 2315 U/L — ABNORMAL HIGH (ref 15–41)
Albumin: 1.9 g/dL — ABNORMAL LOW (ref 3.5–5.0)
Alkaline Phosphatase: 37 U/L — ABNORMAL LOW (ref 38–126)
BUN: 104 mg/dL — ABNORMAL HIGH (ref 6–20)
CHLORIDE: 112 mmol/L — AB (ref 101–111)
CO2: 19 mmol/L — ABNORMAL LOW (ref 22–32)
CREATININE: 2.91 mg/dL — AB (ref 0.44–1.00)
Calcium: 7.9 mg/dL — ABNORMAL LOW (ref 8.9–10.3)
GFR calc non Af Amer: 14 mL/min — ABNORMAL LOW (ref >60)
GFR, EST AFRICAN AMERICAN: 16 mL/min — AB (ref >60)
Glucose, Bld: 125 mg/dL — ABNORMAL HIGH (ref 65–99)
Potassium: 5.4 mmol/L — ABNORMAL HIGH (ref 3.5–5.1)
SODIUM: 145 mmol/L (ref 135–145)
Total Bilirubin: 1 mg/dL (ref 0.3–1.2)
Total Protein: 3.9 g/dL — ABNORMAL LOW (ref 6.5–8.1)

## 2017-04-19 LAB — CBC WITH DIFFERENTIAL/PLATELET
BASOS PCT: 0 %
Basophils Absolute: 0 10*3/uL (ref 0.0–0.1)
EOS ABS: 0 10*3/uL (ref 0.0–0.7)
EOS PCT: 0 %
HCT: 12.5 % — ABNORMAL LOW (ref 36.0–46.0)
Hemoglobin: 3.6 g/dL — CL (ref 12.0–15.0)
Lymphocytes Relative: 9 %
Lymphs Abs: 0.7 10*3/uL (ref 0.7–4.0)
MCH: 27.5 pg (ref 26.0–34.0)
MCHC: 28.8 g/dL — ABNORMAL LOW (ref 30.0–36.0)
MCV: 95.4 fL (ref 78.0–100.0)
Monocytes Absolute: 0.3 10*3/uL (ref 0.1–1.0)
Monocytes Relative: 4 %
Neutro Abs: 7.4 10*3/uL (ref 1.7–7.7)
Neutrophils Relative %: 87 %
PLATELETS: 252 10*3/uL (ref 150–400)
RBC: 1.31 MIL/uL — AB (ref 3.87–5.11)
RDW: 20.3 % — ABNORMAL HIGH (ref 11.5–15.5)
WBC: 8.6 10*3/uL (ref 4.0–10.5)

## 2017-04-19 LAB — I-STAT VENOUS BLOOD GAS, ED
Acid-base deficit: 6 mmol/L — ABNORMAL HIGH (ref 0.0–2.0)
BICARBONATE: 20 mmol/L (ref 20.0–28.0)
O2 Saturation: 94 %
PCO2 VEN: 38.9 mmHg — AB (ref 44.0–60.0)
PH VEN: 7.319 (ref 7.250–7.430)
TCO2: 21 mmol/L — AB (ref 22–32)
pO2, Ven: 75 mmHg — ABNORMAL HIGH (ref 32.0–45.0)

## 2017-04-19 LAB — BRAIN NATRIURETIC PEPTIDE: B Natriuretic Peptide: 502.7 pg/mL — ABNORMAL HIGH (ref 0.0–100.0)

## 2017-04-19 LAB — TROPONIN I: Troponin I: 0.04 ng/mL (ref <0.03)

## 2017-04-19 MED ORDER — LORAZEPAM 1 MG PO TABS: 1.0000 mg | ORAL_TABLET | ORAL | Status: DC | PRN

## 2017-04-19 MED ORDER — GLYCOPYRROLATE 1 MG PO TABS
1.0000 mg | ORAL_TABLET | ORAL | Status: DC | PRN
  Filled 2017-04-19: qty 1

## 2017-04-19 MED ORDER — LORAZEPAM 2 MG/ML PO CONC
1.0000 mg | ORAL | Status: DC | PRN
  Filled 2017-04-19: qty 0.5

## 2017-04-19 MED ORDER — SODIUM CHLORIDE 0.9 % IV SOLN: 8.0000 mg | Freq: Once | INTRAVENOUS | Status: DC

## 2017-04-19 MED ORDER — ONDANSETRON HCL 4 MG/2ML IJ SOLN: 4.0000 mg | Freq: Four times a day (QID) | INTRAMUSCULAR | Status: DC | PRN

## 2017-04-19 MED ORDER — HALOPERIDOL LACTATE 2 MG/ML PO CONC: 0.5000 mg | ORAL | Status: DC | PRN

## 2017-04-19 MED ORDER — ONDANSETRON HCL 4 MG/2ML IJ SOLN
4.0000 mg | Freq: Once | INTRAMUSCULAR | Status: DC
  Filled 2017-04-19: qty 2

## 2017-04-19 MED ORDER — GLYCOPYRROLATE 0.2 MG/ML IJ SOLN: 0.2000 mg | INTRAMUSCULAR | Status: DC | PRN

## 2017-04-19 MED ORDER — LORAZEPAM 2 MG/ML IJ SOLN: 1.0000 mg | Freq: Once | INTRAMUSCULAR | Status: DC

## 2017-04-19 MED ORDER — HALOPERIDOL LACTATE 5 MG/ML IJ SOLN: 0.5000 mg | INTRAMUSCULAR | Status: DC | PRN

## 2017-04-19 MED ORDER — GLYCOPYRROLATE 1 MG PO TABS: 1.0000 mg | ORAL_TABLET | ORAL | Status: DC | PRN

## 2017-04-19 MED ORDER — BIOTENE DRY MOUTH MT LIQD: 15.0000 mL | OROMUCOSAL | Status: DC | PRN

## 2017-04-19 MED ORDER — LORAZEPAM 2 MG/ML IJ SOLN
1.0000 mg | Freq: Once | INTRAMUSCULAR | Status: AC
  Administered 2017-04-19: 1 mg via INTRAVENOUS
  Filled 2017-04-19: qty 1

## 2017-04-19 MED ORDER — ACETAMINOPHEN 650 MG RE SUPP: 650.0000 mg | Freq: Four times a day (QID) | RECTAL | Status: DC | PRN

## 2017-04-19 MED ORDER — FENTANYL CITRATE (PF) 100 MCG/2ML IJ SOLN
25.0000 ug | INTRAMUSCULAR | Status: DC | PRN
  Administered 2017-04-19: 25 ug via INTRAVENOUS
  Filled 2017-04-19: qty 2

## 2017-04-19 MED ORDER — ONDANSETRON HCL 4 MG/2ML IJ SOLN
4.0000 mg | Freq: Once | INTRAMUSCULAR | Status: AC
  Administered 2017-04-19: 4 mg via INTRAVENOUS
  Filled 2017-04-19: qty 2

## 2017-04-19 MED ORDER — HALOPERIDOL LACTATE 2 MG/ML PO CONC
0.5000 mg | ORAL | Status: DC | PRN
  Filled 2017-04-19: qty 0.3

## 2017-04-19 MED ORDER — HALOPERIDOL 1 MG PO TABS: 0.5000 mg | ORAL_TABLET | ORAL | Status: DC | PRN

## 2017-04-19 MED ORDER — SODIUM CHLORIDE 0.9 % IV SOLN
12.5000 mg | Freq: Four times a day (QID) | INTRAVENOUS | Status: DC | PRN
  Filled 2017-04-19: qty 0.5

## 2017-04-19 MED ORDER — ONDANSETRON 4 MG PO TBDP: 4.0000 mg | ORAL_TABLET | Freq: Four times a day (QID) | ORAL | Status: DC | PRN

## 2017-04-19 MED ORDER — POLYVINYL ALCOHOL 1.4 % OP SOLN: 1.0000 [drp] | Freq: Four times a day (QID) | OPHTHALMIC | Status: DC | PRN

## 2017-04-19 MED ORDER — LORAZEPAM 2 MG/ML IJ SOLN: 1.0000 mg | INTRAMUSCULAR | Status: DC | PRN

## 2017-04-19 MED ORDER — LORAZEPAM 2 MG/ML PO CONC: 1.0000 mg | ORAL | Status: DC | PRN

## 2017-04-19 MED ORDER — ACETAMINOPHEN 325 MG PO TABS: 650.0000 mg | ORAL_TABLET | Freq: Four times a day (QID) | ORAL | Status: DC | PRN

## 2017-04-19 MED ORDER — FENTANYL CITRATE (PF) 100 MCG/2ML IJ SOLN: 25.0000 ug | INTRAMUSCULAR | Status: DC | PRN

## 2017-04-19 MED ORDER — ACETAMINOPHEN 650 MG RE SUPP
650.0000 mg | Freq: Four times a day (QID) | RECTAL | Status: DC | PRN
Start: 2017-04-19 — End: 2017-04-19

## 2017-04-19 MED ORDER — ONDANSETRON HCL 4 MG/2ML IJ SOLN
4.0000 mg | Freq: Once | INTRAMUSCULAR | Status: AC
  Administered 2017-04-19: 4 mg via INTRAVENOUS

## 2017-04-19 MED ORDER — POLYVINYL ALCOHOL 1.4 % OP SOLN
1.0000 [drp] | Freq: Four times a day (QID) | OPHTHALMIC | Status: DC | PRN
  Filled 2017-04-19: qty 15

## 2017-04-26 DIAGNOSIS — 419620001 Death: Secondary | SNOMED CT | POA: Diagnosis not present

## 2017-04-26 NOTE — ED Notes (Signed)
Daughter at bedside.

## 2017-04-26 NOTE — ED Notes (Signed)
Family member are with patient. Spoke with family and told them to come get me if anything was needed. Giving family time and space with patient.

## 2017-04-26 NOTE — Progress Notes (Signed)
Pt arrived to unit at approximately 1800, Pt was transferred to bed.  Foley catheter was placed, skin assessed with Thess RN and again with Noreene Larsson, Charity fundraiser.   Pt has stage 1 on her buttox, with generalized bruising and edema.

## 2017-04-26 NOTE — ED Triage Notes (Signed)
To room via EMS.  Called out to [facility] for N/V.  Per home staff went to give scheduled 1pm Albuterol treatment, SpO2 dropped.  CPAP started with permission from son.  Pt has MOST and DNR form.

## 2017-04-26 NOTE — H&P (Signed)
[facility] History and Physical  Alexis [name] WUJ:[id number] DOB: October 25, 1931 DOA: 04/03/2017  Referring physician:  PCP: [name], PA-C   Chief Complaint: Acute on chronic GI bleed. Patient COMFORT CARE  HPI: Alexis [name] is a 83 y.o.  WF PMHx  Anxiety, Chronic diastolic CHF,Paroxysmal atrial fibrillation CHA2DS2VASc = 6-->coumadin, CKD stage IV,  COPD (on home O2 @ 3lpm). GIB, HTN, Macular degeneration, bilateral, MI,  DM Type 2 with complications Peripheral Neuropathy,  Varicose veins  Remainder of history obtained from family. Large family present state that patient does not want any extraordinary lifesaving measures to include transfusion, BiPAP. Would like patient to be comfortable. Patient's pastor also is at bedside with family members    Review of Systems: Obtained per family members Constitutional:  No weight loss, night sweats, Fevers, chills, fatigue.  HEENT:  No headaches, Difficulty swallowing,Tooth/dental problems,Sore throat,  No sneezing, itching, ear ache, nasal congestion, post nasal drip,  Cardio-vascular:  No chest pain, Orthopnea, PND, swelling in lower extremities, anasarca, dizziness, palpitations  GI:  No heartburn, indigestion, abdominal pain, nausea, vomiting, diarrhea, change in bowel habits, loss of appetite  Resp:  Positive shortness of breath with exertion or at rest. No excess mucus, no productive cough, No non-productive cough, No coughing up of blood.No change in color of mucus.No wheezing.No chest wall deformity  Skin:  no rash or lesions.  GU:  no dysuria, change in color of urine, no urgency or frequency. No flank pain.  Musculoskeletal:  No joint pain or swelling. No decreased range of motion. No back pain.  Psych:  No change in mood or affect. No depression or anxiety. No memory loss.   Past Medical History:  Diagnosis Date  . Anxiety   . Chronic diastolic CHF (congestive heart failure) (HCC)    a. 05/2015  Echo: EF 55-60%, mild LVH, no rwma, mild MR, mildly dil RA, mod TR, PASP .  . CKD (chronic kidney disease), stage IV (HCC)   . Confusion state   . COPD (chronic obstructive pulmonary disease) (HCC)    a. on home O2 @ 3lpm.  . Elevated troponin    a. H/o elevated trop in setting of CHF; b. 06/2013 Lexiscan MV: nl EF, mild anterior breast attenuation, no ischemia.   . Family history of adverse reaction to anesthesia   . GIB (gastrointestinal bleeding)    a. History of GIB.  Marland Kitchen Hypertensive heart disease   . Macular degeneration, bilateral   . Myocardial infarction (HCC)   . Neuropathy   . Paroxysmal atrial fibrillation (HCC)    a. CHA2DS2VASc = 6-->coumadin.  Marland Kitchen PONV (postoperative nausea and vomiting)   . Type II diabetes mellitus (HCC)   . Varicose veins    Past Surgical History:  Procedure Laterality Date  . ABDOMINAL HYSTERECTOMY    . CARDIOVERSION N/A 09/22/2015   Procedure: CARDIOVERSION;  Surgeon: Chrystie Nose, MD;  Location: [facility] ENDOSCOPY;  Service: Cardiovascular;  Laterality: N/A;  . ESOPHAGOGASTRODUODENOSCOPY N/A 12/01/2014   Procedure: ESOPHAGOGASTRODUODENOSCOPY (EGD);  Surgeon: Hilarie Fredrickson, MD;  Location: [facility] ENDOSCOPY;  Service: Endoscopy;  Laterality: N/A;  . FRACTURE SURGERY    . HIP FRACTURE SURGERY  2004  . VEIN SURGERY     Social History:  reports that she is a non-smoker but has been exposed to tobacco smoke. she has never used smokeless tobacco. She reports that she does not drink alcohol or use drugs.  Allergies  Allergen Reactions  . Yellow Dyes (Non-Tartrazine) Nausea And Vomiting    "  deathly allergic" No other information given to explain reaction. Per Daughter- this was an IV dye. Ok with yellow coloring on Coumadin tablets.   . Codeine Hives  . Other Other (See Comments)    novocaine broke mouth out  . Penicillins Hives  . Procaine Hives    Family History  Problem Relation Age of Onset  . Colon cancer Father 7980       advanced when discovered,  no surgery or therapy.  this led to his death  . Breast cancer Mother   . Hypertension Mother   . Alzheimer's disease Mother   . Breast cancer Sister   . Cancer Brother   . Lung cancer Brother   . Thyroid disease Neg Hx   . Heart attack Neg Hx        albuterol (PROVENTIL HFA;VENTOLIN HFA) 108 (90 BASE) MCG/ACT inhaler Inhale 2 puffs into the lungs every 6 (six) hours as needed for wheezing or shortness of breath. 06/11/13  Yes Dhungel, Nishant, MD  atorvastatin (LIPITOR) 20 MG tablet Take 20 mg by mouth at bedtime.    Yes [provider]  cholecalciferol (VITAMIN D) 1000 UNITS tablet Take 1,000 Units by mouth every morning.    Yes [provider]  docusate sodium (COLACE) 100 MG capsule Take 100 mg by mouth 2 (two) times daily.    Yes [provider]  escitalopram (LEXAPRO) 10 MG tablet Take 10 mg by mouth daily.   Yes [provider]  feeding supplement, ENSURE ENLIVE, (ENSURE ENLIVE) LIQD Take 237 mLs 2 (two) times daily between meals by mouth. 04/08/17  Yes Alwyn RenMathews, Elizabeth G, MD  fenofibrate 160 MG tablet Take 160 mg by mouth daily.   Yes [provider]  gabapentin (NEURONTIN) 100 MG capsule Take 1 capsule (100 mg total) by mouth at bedtime. 08/05/15  Yes Rodolph Bonghompson, Daniel V, MD  guaiFENesin (MUCINEX) 600 MG 12 hr tablet Take 1 tablet (600 mg total) 2 (two) times daily by mouth. 04/08/17  Yes Alwyn RenMathews, Elizabeth G, MD  hydrALAZINE (APRESOLINE) 50 MG tablet Take 1.5 tablets (75 mg total) by mouth 2 (two) times daily. 02/15/17  Yes Deneise LeverSaraiya, Parth, MD  insulin aspart (NOVOLOG) 100 UNIT/ML injection Inject 5 Units 3 (three) times daily with meals into the skin. 04/08/17  Yes Alwyn RenMathews, Elizabeth G, MD  insulin detemir (LEVEMIR) 100 UNIT/ML injection Inject 0.05 mLs (5 Units total) daily into the skin. 04/09/17  Yes Alwyn RenMathews, Elizabeth G, MD  ipratropium-albuterol (DUONEB) 0.5-2.5 (3) MG/3ML SOLN Take 3 mLs 3 (three) times daily by nebulization.  04/08/17  Yes Alwyn RenMathews, Elizabeth G, MD  isosorbide mononitrate (IMDUR) 30 MG 24 hr tablet TAKE 1 TABLET BY MOUTH EVERY DAY Patient taking differently: TAKE 30mg  BY MOUTH EVERY DAY 04/07/17  Yes Hilty, Lisette AbuKenneth C, MD  methimazole (TAPAZOLE) 5 MG tablet Take 5 mg by mouth daily. 03/18/17  Yes [provider]  metoprolol tartrate (LOPRESSOR) 25 MG tablet Take 0.5 tablets (12.5 mg total) by mouth 2 (two) times daily. 12/30/16  Yes Hilty, Lisette AbuKenneth C, MD  OXYGEN Inhale 3 L into the lungs at bedtime.    Yes [provider]  potassium chloride SA (KLOR-CON M20) 20 MEQ tablet Take 0.5 tablets (10 mEq total) daily by mouth. 04/08/17  Yes Alwyn RenMathews, Elizabeth G, MD  torsemide (DEMADEX) 20 MG tablet Take 0.5 tablets (10 mg total) daily by mouth. Patient taking differently: Take 20 mg by mouth daily.  04/08/17  Yes Alwyn RenMathews, Elizabeth G, MD  warfarin (COUMADIN) 4  MG tablet TAKE 1 TO 1&1/2 TABLETS BY MOUTH AS DIRECTED BY COUMADIN CLINIC Patient taking differently: TAKE 2MG  BY MOUTH once DAILY 03/13/17  Yes Hilty, Lisette Abu, MD  polyethylene glycol (MIRALAX / GLYCOLAX) packet Take 17 g by mouth daily as needed for mild constipation.    [provider]     Consultants:  PALLIATIVE CARE pending   Procedures/Significant Events:    I have personally reviewed and interpreted all radiology studies and my findings are as above.   VENTILATOR SETTINGS:    Cultures   Antimicrobials:    Devices    LINES / TUBES:      Continuous Infusions:  Physical Exam: Vitals:   05-15-2017 1430 2017/05/15 1443 05/15/2017 1500 15-May-2017 1558  BP: (!) 73/36 (!) 65/24 (!) 92/36 (!) 87/49  Pulse: 95 93 92 95  Resp: 17 19 (!) 22 19  Temp:      TempSrc:      SpO2: 100% 100% 96% 98%    Wt Readings from Last 3 Encounters:  04/08/17 171 lb 11.8 oz (77.9 kg)  02/13/17 148 lb (67.1 kg)  10/10/16 142 lb 3.2 oz (64.5 kg)    General: No acute respiratory distress, unresponsive Neck:  Negative  scars, masses, torticollis, lymphadenopathy, JVD Lungs: Clear to auscultation bilaterally without wheezes or crackles Cardiovascular: Regular rate and rhythm without murmur gallop or rub normal S1 and S2 Abdomen: negative abdominal pain, nondistended, positive soft, bowel sounds, no rebound, no ascites, no appreciable mass Extremities: No significant cyanosis, clubbing, or edema bilateral lower extremities Skin: Clammy Negative rashes, lesions, ulcers Psychiatric:  Unable to assess secondary to deteriorating status  Central nervous system:  Unresponsive         Labs on Admission:  Basic Metabolic Panel: Recent Labs  Lab 05-15-2017 1402  NA 145  K 5.4*  CL 112*  CO2 19*  GLUCOSE 125*  BUN 104*  CREATININE 2.91*  CALCIUM 7.9*   Liver Function Tests: Recent Labs  Lab 05-15-2017 1402  AST 2,315*  ALT 952*  ALKPHOS 37*  BILITOT 1.0  PROT 3.9*  ALBUMIN 1.9*   No results for input(s): LIPASE, AMYLASE in the last 168 hours. No results for input(s): AMMONIA in the last 168 hours. CBC: Recent Labs  Lab 05-15-17 1402  WBC 8.6  NEUTROABS 7.4  HGB 3.6*  HCT 12.5*  MCV 95.4  PLT 252   Cardiac Enzymes: Recent Labs  Lab 2017-05-15 1402  TROPONINI 0.04*    BNP (last 3 results) Recent Labs    03/20/17 1130 May 15, 2017 1402  BNP 710.5* 502.7*    ProBNP (last 3 results) No results for input(s): PROBNP in the last 8760 hours.  CBG: No results for input(s): GLUCAP in the last 168 hours.  Radiological Exams on Admission: Dg Chest Portable 1 View  Result Date: 15-May-2017 CLINICAL DATA:  83 year old female with low O2 saturation and left lung crackles. EXAM: PORTABLE CHEST 1 VIEW COMPARISON:  04/02/2017 FINDINGS: Cardiomediastinal silhouette is partially obscured but grossly stable in size and morphology. There is pulmonary vascular congestion and interstitial edema bilaterally. It has the appearance is slightly improved since prior exam patient. Note is made of bilateral  pleural effusions and associated atelectasis with likely airspace consolidation at the right lung base. Multiple old rib fractures again noted on the left. IMPRESSION: 1. Cardiomegaly with pulmonary vascular congestion and mild interstitial edema. Findings are minimally improved from prior examination. 2. Right basilar consolidation. 3. Multiple left-sided rib fractures again noted, similar to previous.  Electronically Signed   By: Sande Brothers M.D.   On: 05-07-17 14:29    EKG: Independently reviewed.   Assessment/Plan Active Problems:   * No active hospital problems. *  Acute GI bleed  -Per family patient would not want any extraordinary measures used to continue her life to include blood transfusion, intubation, BiPAP, pressors, CPR etc. -COMFORT CARE  Anticipated hospital death -See acute GI bleed     Code Status: DO NOT RESUSCITATE  (DVT Prophylaxis: None Family Communication: Large family at bedside for discussion of plan of care  Disposition Plan: Anticipated hospital death    Data Reviewed: Care during the described time interval was provided by me .  I have reviewed this patient's available data, including medical history, events of note, physical examination, and all test results as part of my evaluation.   Time spent: 60 min  WOODS, Roselind Messier [facility] Pager [phone]

## 2017-04-26 NOTE — ED Provider Notes (Signed)
Emergency Department Provider Note   I have reviewed the triage vital signs and the nursing notes.   HISTORY  Chief Complaint Shortness of Breath   HPI Alexis [name] is a 83 y.o. female with multiple medical problems as documented below the presents to the emergency department today with respiratory distress.  The patient's records indicate she is DNR/DNI with a MOST form.  Initial history only from EMS stated she had some dark vomiting and subsequently had respiratory distress with altered mental status.  No witnessed aspiration.  On EMS arrival sats were in the low 70s.  Patient was altered.  They talked to the son who allowed him to use BiPAP to get the patient here but does not want any further significant interventions.   Level V Caveat Secondary to Altered Mental Status and Confusion  Past Medical History:  Diagnosis Date  . Anxiety   . Chronic diastolic CHF (congestive heart failure) (HCC)    a. 05/2015 Echo: EF 55-60%, mild LVH, no rwma, mild MR, mildly dil RA, mod TR, PASP .  . CKD (chronic kidney disease), stage IV (HCC)   . Confusion state   . COPD (chronic obstructive pulmonary disease) (HCC)    a. on home O2 @ 3lpm.  . Elevated troponin    a. H/o elevated trop in setting of CHF; b. 06/2013 Lexiscan MV: nl EF, mild anterior breast attenuation, no ischemia.   . Family history of adverse reaction to anesthesia   . GIB (gastrointestinal bleeding)    a. History of GIB.  Marland Kitchen Hypertensive heart disease   . Macular degeneration, bilateral   . Myocardial infarction (HCC)   . Neuropathy   . Paroxysmal atrial fibrillation (HCC)    a. CHA2DS2VASc = 6-->coumadin.  Marland Kitchen PONV (postoperative nausea and vomiting)   . Type II diabetes mellitus (HCC)   . Varicose veins     Patient Active Problem List   Diagnosis Date Noted  . Death in hospital 04/29/2017  . DNR (do not resuscitate)   . Palliative care by specialist   . Acute on chronic respiratory failure with  hypoxia (HCC) 03/31/2017  . COPD with acute exacerbation (HCC) 03/30/2017  . Acute on chronic diastolic CHF (congestive heart failure) (HCC) 03/26/2017  . Severe pulmonary arterial systolic hypertension (HCC) 03/26/2017  . Frequent falls 02/14/2017  . Aortic atherosclerosis (HCC) 02/14/2017  . Peripheral vascular disease (HCC) 02/14/2017  . CKD (chronic kidney disease), stage IV (HCC) 03/04/2016  . Depression with anxiety 02/28/2016  . HLD (hyperlipidemia) 02/28/2016  . Weakness generalized   . AKI (acute kidney injury) (HCC)   . Type II diabetes mellitus (HCC)   . Hypertensive heart disease with heart failure (HCC)   . Atrial fibrillation, persistent (HCC)   . Simple chronic bronchitis (HCC)   . Chronic respiratory failure with hypoxia (HCC) 06/30/2015  . Hyperthyroidism 01/23/2015  . Long term current use of anticoagulant therapy 12/12/2014  . Paroxysmal atrial fibrillation (HCC) 12/01/2014  . Melena   . Normocytic anemia 11/29/2014  . COPD (chronic obstructive pulmonary disease) (HCC) 07/12/2014  . Essential hypertension 06/10/2013  . Chronic diastolic heart failure, NYHA class 1 (HCC) 06/09/2013    Past Surgical History:  Procedure Laterality Date  . ABDOMINAL HYSTERECTOMY    . CARDIOVERSION N/A 09/22/2015   Procedure: CARDIOVERSION;  Surgeon: Chrystie Nose, MD;  Location: [facility] ENDOSCOPY;  Service: Cardiovascular;  Laterality: N/A;  . ESOPHAGOGASTRODUODENOSCOPY N/A 12/01/2014   Procedure: ESOPHAGOGASTRODUODENOSCOPY (EGD);  Surgeon: Hilarie Fredrickson, MD;  Location: [facility] ENDOSCOPY;  Service: Endoscopy;  Laterality: N/A;  . FRACTURE SURGERY    . HIP FRACTURE SURGERY  2004  . VEIN SURGERY      Current Outpatient Rx  . Order #: [id number] Class: Print  . Order #: [id number] Class: Historical Med  . Order #: [id number] Class: Historical Med  . Order #: [id number] Class: Historical Med  . Order #: [id number] Class: Historical Med  . Order #: [id number] Class: Normal  . Order #: [id number] Class:  Historical Med  . Order #: [id number] Class: No Print  . Order #: [id number] Class: Normal  . Order #: [id number] Class: Normal  . Order #: [id number] Class: Normal  . Order #: [id number] Class: Normal  . Order #: [id number] Class: Normal  . Order #: [id number] Class: Normal  . Order #: [id number] Class: Historical Med  . Order #: [id number] Class: Normal  . Order #: [id number] Class: Historical Med  . Order #: [id number] Class: Normal  . Order #: [id number] Class: Normal  . Order #: [id number] Class: Normal  . Order #: [id number] Class: Historical Med    Allergies Yellow dyes (non-tartrazine); Codeine; Other; Penicillins; and Procaine  Family History  Problem Relation Age of Onset  . Colon cancer Father 77       advanced when discovered, no surgery or therapy.  this led to his death  . Breast cancer Mother   . Hypertension Mother   . Alzheimer's disease Mother   . Breast cancer Sister   . Cancer Brother   . Lung cancer Brother   . Thyroid disease Neg Hx   . Heart attack Neg Hx     Social History Social History   Tobacco Use  . Smoking status: Passive Smoke Exposure - Never Smoker  . Smokeless tobacco: Never Used  Substance Use Topics  . Alcohol use: No    Alcohol/week: 0.0 oz  . Drug use: No    Review of Systems  Level V Caveat Secondary to Altered Mental Status and Confusion ____________________________________________   PHYSICAL EXAM:  VITAL SIGNS: ED Triage Vitals  Enc Vitals Group     BP 02-May-2017 1355 113/82     Pulse Rate 05/02/2017 1355 (!) 110     Resp 2017-05-02 1355 17     Temp 2017-05-02 1400 97.8 F (36.6 C)     Temp Source 05-02-17 1400 Axillary     SpO2 May 02, 2017 1355 100 %    Constitutional: Alert to voice with eye opening only. Pale appearing in respiratory distress Eyes: Conjunctivae are pale. EOMI. Head: Atraumatic. Nose: No congestion/rhinnorhea. Mouth/Throat: Mucous membranes are dry.  Oropharynx non-erythematous. Neck: No stridor.  No meningeal signs.     Cardiovascular: Normal rate, regular rhythm. Good peripheral circulation. Grossly normal heart sounds.   Respiratory: tachypneic respiratory effort.  No retractions. Lungs with L>R rales and wheezing. Gastrointestinal: Soft and nontender. No distention.  Musculoskeletal: No lower extremity tenderness nor edema. No gross deformities of extremities. Neurologic:  Not able to fully assess secondary to altered mental status and acuity of condition. No gross abnormalities.  Skin:  Skin is warm, dry and intact. No rash noted.   ____________________________________________   LABS (all labs ordered are listed, but only abnormal results are displayed)  Labs Reviewed  CBC WITH DIFFERENTIAL/PLATELET - Abnormal; Notable for the following components:      Result Value   RBC 1.31 (*)    Hemoglobin 3.6 (*)    HCT 12.5 (*)    MCHC 28.8 (*)    RDW 20.3 (*)    All other components within normal limits  COMPREHENSIVE METABOLIC PANEL -  Abnormal; Notable for the following components:   Potassium 5.4 (*)    Chloride 112 (*)    CO2 19 (*)    Glucose, Bld 125 (*)    BUN 104 (*)    Creatinine, Ser 2.91 (*)    Calcium 7.9 (*)    Total Protein 3.9 (*)    Albumin 1.9 (*)    AST 2,315 (*)    ALT 952 (*)    Alkaline Phosphatase 37 (*)    GFR calc non Af Amer 14 (*)    GFR calc Af Amer 16 (*)    All other components within normal limits  BRAIN NATRIURETIC PEPTIDE - Abnormal; Notable for the following components:   B Natriuretic Peptide 502.7 (*)    All other components within normal limits  TROPONIN I - Abnormal; Notable for the following components:   Troponin I 0.04 (*)    All other components within normal limits  I-STAT VENOUS BLOOD GAS, ED - Abnormal; Notable for the following components:   pCO2, Ven 38.9 (*)    pO2, Ven 75.0 (*)    TCO2 21 (*)    Acid-base deficit 6.0 (*)    All other components within normal limits  I-STAT TROPONIN, ED    ____________________________________________  EKG   ____________________________________________  RADIOLOGY  Dg Chest Portable 1 View  Result Date: 04-02-17 CLINICAL DATA:  83 year old female with low O2 saturation and left lung crackles. EXAM: PORTABLE CHEST 1 VIEW COMPARISON:  04/02/2017 FINDINGS: Cardiomediastinal silhouette is partially obscured but grossly stable in size and morphology. There is pulmonary vascular congestion and interstitial edema bilaterally. It has the appearance is slightly improved since prior exam patient. Note is made of bilateral pleural effusions and associated atelectasis with likely airspace consolidation at the right lung base. Multiple old rib fractures again noted on the left. IMPRESSION: 1. Cardiomegaly with pulmonary vascular congestion and mild interstitial edema. Findings are minimally improved from prior examination. 2. Right basilar consolidation. 3. Multiple left-sided rib fractures again noted, similar to previous. Electronically Signed   By: Sande BrothersSerena  Chacko M.D.   On: 04-02-17 14:29    ____________________________________________   PROCEDURES  Procedure(s) performed:   Procedures  CRITICAL CARE Performed by: Marily MemosMesner, Abdulrahman Bracey Total critical care time: 35 minutes Critical care time was exclusive of separately billable procedures and treating other patients. Critical care was necessary to treat or prevent imminent or life-threatening deterioration. Critical care was time spent personally by me on the following activities: development of treatment plan with patient and/or surrogate as well as nursing, discussions with consultants, evaluation of patient's response to treatment, examination of patient, obtaining history from patient or surrogate, ordering and performing treatments and interventions, ordering and review of laboratory studies, ordering and review of radiographic studies, pulse oximetry and re-evaluation of patient's  condition.  ____________________________________________   INITIAL IMPRESSION / ASSESSMENT AND PLAN / ED COURSE  Pertinent labs & imaging results that were available during my care of the patient were reviewed by me and considered in my medical decision making (see chart for details).  ecg concerning for possible global ischemia, however patient is in respiratory distress with altered mental status, DNR and most form that states she would not want intubation.  I discussed the EKG with cardiology regarding whether or not this would be a stent candidate.  Cardiology at bedside, nothing to do from their standpoint (regarding acute interventions), willing to consult later when more stable if needed.   Critical hemoglobin of 3.9.  Patient also  has some slowly decreasing blood pressures.  Discussed with her daughter at bedside who is not a healthcare power of attorney but is only Management consultant I have here right now who states that all the patient's family members agree that she would not want any extraordinary means to prolong life to include blood transfusions.  We will hold off on transfusing until the rest of family gets here to verify that this would be her wishes.  Patient made DNR in the computer.  Will likely need to go comfort care if this is the case.  We will continue to monitor. Whole family present aside from one person. All agree that the patient would not want transfusion or other interventions. She is 'ready to go home' and has made that statement multiple times to multiple patients. Patient with some vomiting, BiPaP removed. [place] placed, will pursue comfort measures at this time.   hospitalist to admit for comfort care.    ____________________________________________  FINAL CLINICAL IMPRESSION(S) / ED DIAGNOSES  Final diagnoses:  Acute respiratory failure with hypoxia (HCC)  Anemia, unspecified type  Hypovolemic shock (HCC)  End of life care     MEDICATIONS GIVEN DURING THIS  VISIT:  Medications  LORazepam (ATIVAN) injection 1 mg (1 mg Intravenous Given 04/09/2017 1503)  ondansetron (ZOFRAN) injection 4 mg (4 mg Intravenous Given 04/11/2017 1502)  ondansetron (ZOFRAN) injection 4 mg (4 mg Intravenous Given 04/16/2017 1502)     NEW OUTPATIENT MEDICATIONS STARTED DURING THIS VISIT:  This SmartLink is deprecated. Use AVSMEDLIST instead to display the medication list for a patient.  Note:  This note was prepared with assistance of Dragon voice recognition software. Occasional wrong-word or sound-a-like substitutions may have occurred due to the inherent limitations of voice recognition software.   Marily Memos, MD 04/20/17 [phone]

## 2017-04-26 NOTE — ED Notes (Signed)
Date and time results received: 04/11/2017 1433 (use smartphrase ".now" to insert current time)  Test: Hgb Critical Value: 3.6  Name of Provider Notified: Dr. Clayborne Dana  Orders Received? Or Actions Taken?: MD at bedside

## 2017-04-26 DEATH — deceased

## 2017-05-08 DIAGNOSIS — K922 Gastrointestinal hemorrhage, unspecified: Secondary | ICD-10-CM | POA: Diagnosis present

## 2017-05-27 NOTE — Discharge Summary (Signed)
Death Summary      Alexis [name] AYO:[id number] DOB: Jun 03, 1931 DOA: 05/16/2017  PCP: [name], PA-C   Admit date: 05-16-2017 Date of Death: 05/16/17   Final Diagnoses:   Principal Problem:   Death in hospital Active Problems:   COPD (chronic obstructive pulmonary disease) (HCC)   Long term current use of anticoagulant therapy   Type II diabetes mellitus (HCC)   Hypertensive heart disease with heart failure (HCC)   Atrial fibrillation, persistent (HCC)   CKD (chronic kidney disease), stage IV (HCC)   Aortic atherosclerosis (HCC)   Acute on chronic diastolic CHF (congestive heart failure) (HCC)   Severe pulmonary arterial systolic hypertension (HCC)   Acute on chronic respiratory failure with hypoxia (HCC)   DNR (do not resuscitate)   Upper GI bleed   History of Present Illness:   [name] is a 84 y.o.  WF PMHx  Anxiety, Chronic diastolic CHF,Paroxysmal atrial fibrillation CHA2DS2VASc = 6-->coumadin, CKD stage IV,  COPD (on home O2 @ 3lpm). GIB, HTN, Macular degeneration, bilateral, MI,  DM Type 2 with complications Peripheral Neuropathy, Varicose veins who was admitted May 16, 2017 with respiratory distress after aspirating coffee ground emesis. She was hypoxic when EMS arrived. Family indicated that they wanted no extraordinary lifesaving measures including transfusion, BiPAP.   Hospital Course:   The patient was admitted for comfort care with an acute GI bleed and hypoxic respiratory failure from aspiration.  She expired shortly after her admission with family present at 71.   Signed:  Trula Ore Dolce Sylvia  [facility] 05/08/2017, 12:08 PM

## 2017-12-22 IMAGING — CT CT ABD-PELV W/O CM
2 of 4 series · 16 of 46 positions shown, 18 images · non-contrast
Comparison: None.

CLINICAL DATA: Drop in hemoglobin, on Coumadin, evaluate for
retroperitoneal hemorrhage

EXAM:
CT ABDOMEN AND PELVIS WITHOUT CONTRAST
TECHNIQUE: Multidetector CT imaging of the abdomen and pelvis was performed
following the standard protocol without IV contrast.

[Series 2: abd/ pelvis 5.0 i30f 1 · axial · 0.91mm/px · z∈[-394,-29]mm · 13 of 83 slices shown, 15 images]
[im 5/83  soft-tissue]
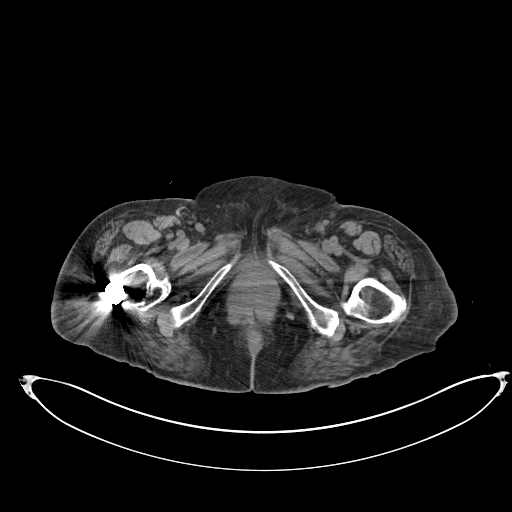
[im 5/83  bone]
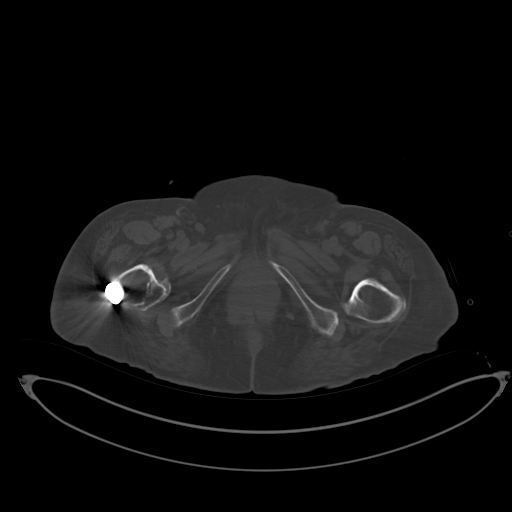
[im 13/83  soft-tissue]
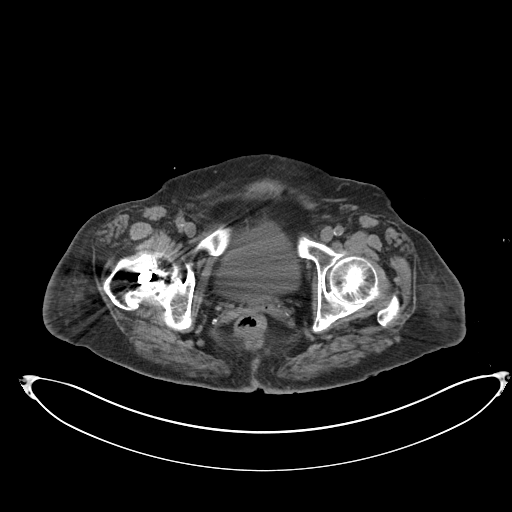
[im 17/83  soft-tissue]
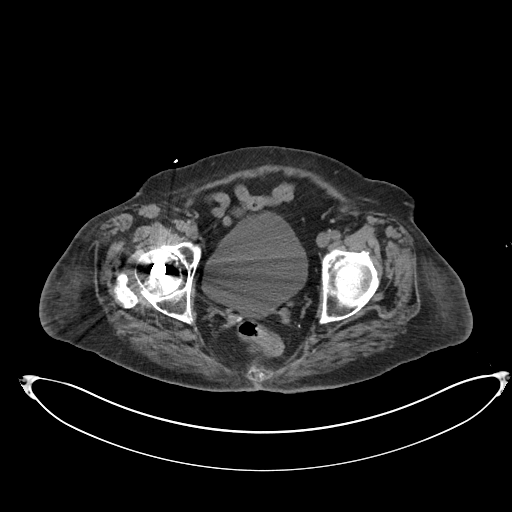
[im 25/83  soft-tissue]
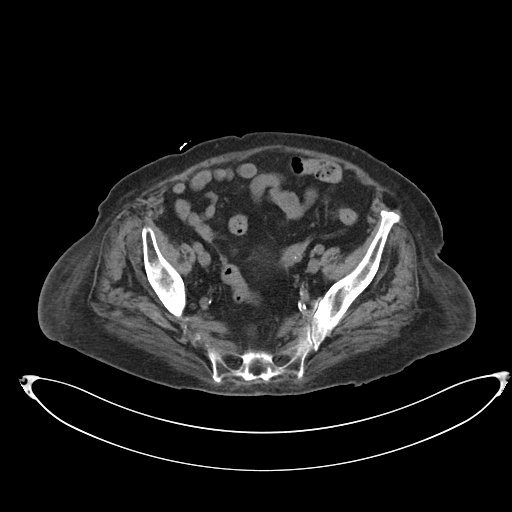
[im 29/83  soft-tissue]
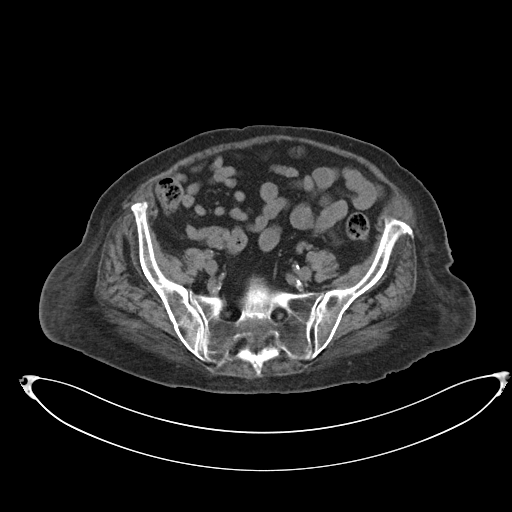
[im 37/83  soft-tissue]
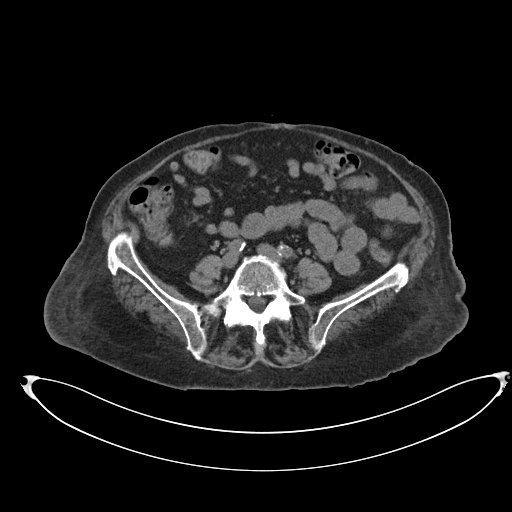
[im 42/83  soft-tissue]
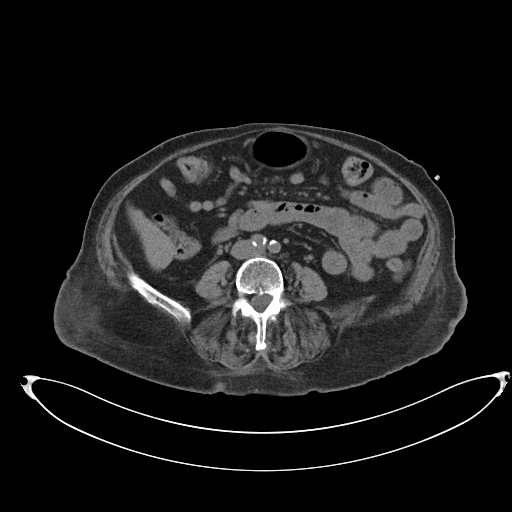
[im 46/83  soft-tissue]
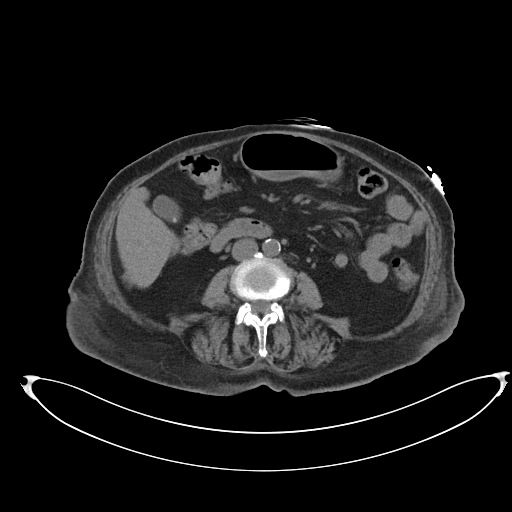
[im 54/83  soft-tissue]
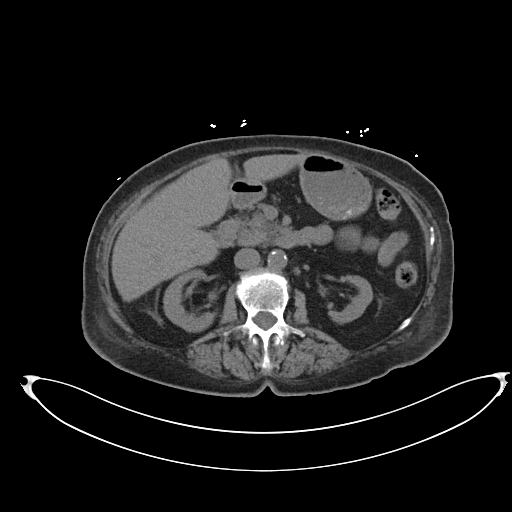
[im 54/83  bone]
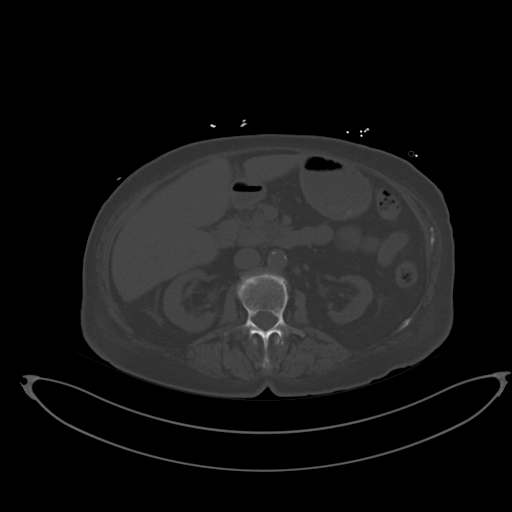
[im 58/83  soft-tissue]
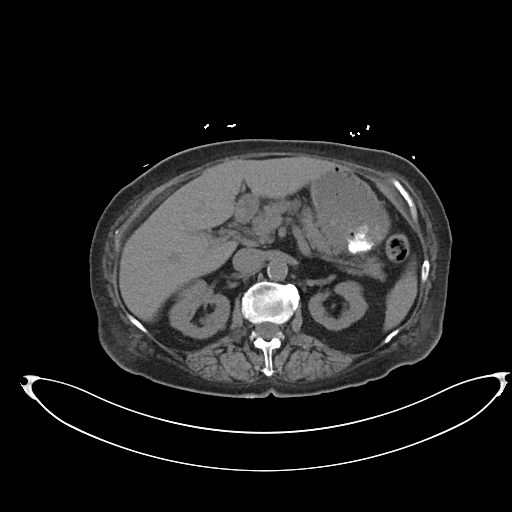
[im 66/83  soft-tissue]
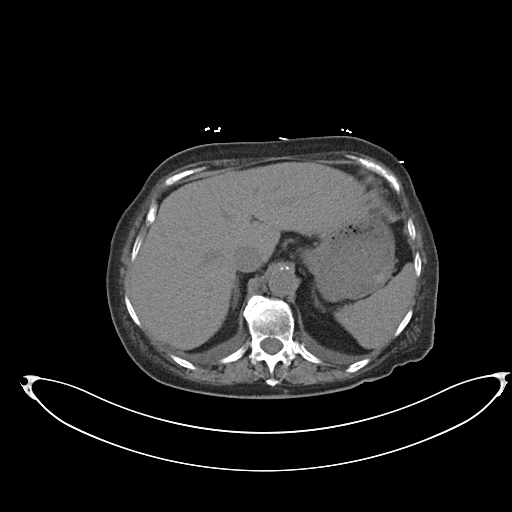
[im 70/83  soft-tissue]
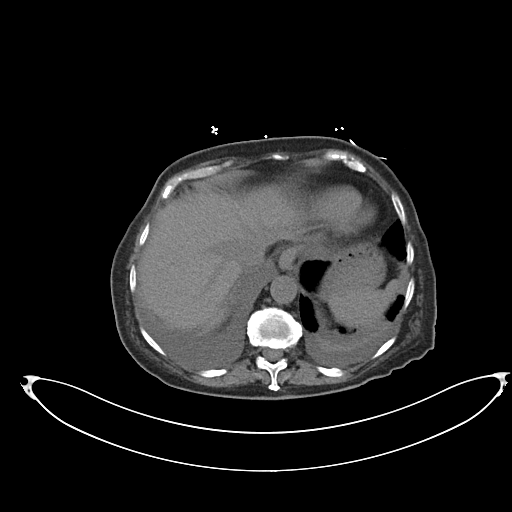
[im 78/83  soft-tissue]
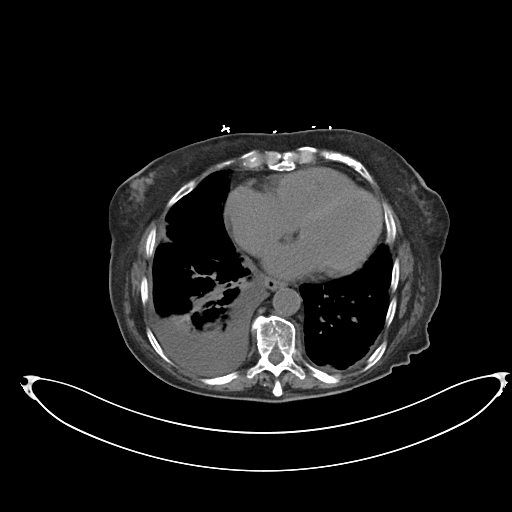

[Series 5: cor st · coronal · 0.69mm/px · 3 of 88 slices shown]
[im 30/88  soft-tissue]
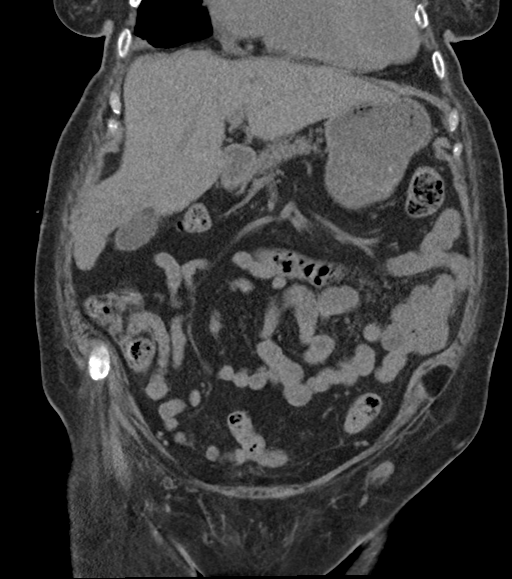
[im 39/88  soft-tissue]
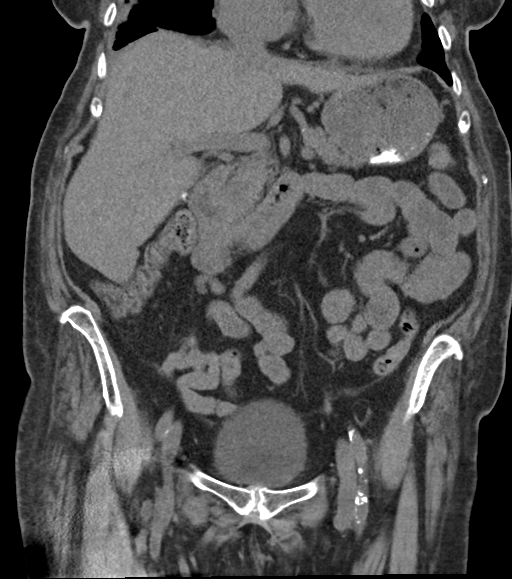
[im 49/88  soft-tissue]
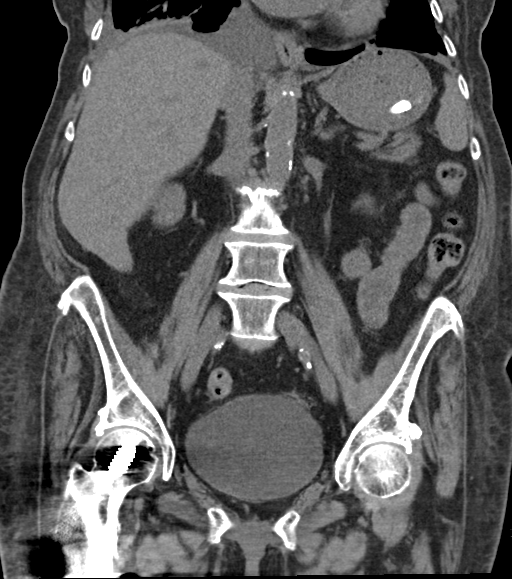

[16 of 46 positions shown; findings below may reference images not displayed]

FINDINGS: Lower chest: Small to moderate right pleural effusion. Trace left
pleural effusion. Associated patchy bilateral lower lobe opacities,
favored to reflect atelectasis. Additional mild patchy opacity in
the posterior right middle lobe (series 4/image 6), nonspecific.

Cardiomegaly. Hypodense blood pool relative to myocardium,
compatible with the clinical history of anemia.

Hepatobiliary: Unenhanced liver is unremarkable.

Multiple layering gallstones (series 2/image 33), without associated
inflammatory changes.

Pancreas: Within normal limits.

Spleen: Within normal limits.

Adrenals/Urinary Tract: Adrenal glands are within normal limits.

4 mm nonobstructing left lower pole renal calculus (series 2/ image
34). Right kidney is within normal limits.

No ureteral or bladder calculi.  No hydronephrosis.

Bladder is within normal limits.

Stomach/Bowel: Stomach is within normal limits.

No evidence of bowel obstruction.

Appendix is not discretely visualized.

Vascular/Lymphatic: Atherosclerotic calcifications of the abdominal
aorta and branch vessels. No evidence of abdominal aortic aneurysm.

No suspicious abdominopelvic lymphadenopathy.

Reproductive: Status post hysterectomy.

Bilateral ovaries are within normal limits.

Other: No abdominopelvic ascites.

No hemoperitoneum or free air.

Musculoskeletal: No evidence of retroperitoneal or intramuscular
hemorrhage.

Mild degenerative changes of the visualized thoracolumbar spine.

Status post ORIF of the proximal right femur.
IMPRESSION: No evidence of retroperitoneal or intramuscular hemorrhage.

Small to moderate right and trace left pleural effusions. Associated
lower lobe opacities, likely atelectasis.

4 mm nonobstructing left lower pole renal calculus. No ureteral or
bladder calculi. No hydronephrosis.

Cholelithiasis, without associated inflammatory changes.
